# Patient Record
Sex: Female | Born: 1935
Health system: Southern US, Community
[De-identification: ages and names within clinical notes are randomized; demographics above are authoritative.]

## PROBLEM LIST (undated history)

## (undated) DIAGNOSIS — K602 Anal fissure, unspecified: Secondary | ICD-10-CM

## (undated) DIAGNOSIS — T7840XA Allergy, unspecified, initial encounter: Secondary | ICD-10-CM

## (undated) DIAGNOSIS — K635 Polyp of colon: Secondary | ICD-10-CM

## (undated) DIAGNOSIS — R079 Chest pain, unspecified: Secondary | ICD-10-CM

## (undated) DIAGNOSIS — K5790 Diverticulosis of intestine, part unspecified, without perforation or abscess without bleeding: Secondary | ICD-10-CM

## (undated) DIAGNOSIS — A419 Sepsis, unspecified organism: Secondary | ICD-10-CM

## (undated) DIAGNOSIS — R0600 Dyspnea, unspecified: Secondary | ICD-10-CM

## (undated) DIAGNOSIS — K649 Unspecified hemorrhoids: Secondary | ICD-10-CM

## (undated) DIAGNOSIS — K219 Gastro-esophageal reflux disease without esophagitis: Secondary | ICD-10-CM

## (undated) DIAGNOSIS — I4819 Other persistent atrial fibrillation: Secondary | ICD-10-CM

## (undated) DIAGNOSIS — T17908A Unspecified foreign body in respiratory tract, part unspecified causing other injury, initial encounter: Secondary | ICD-10-CM

## (undated) DIAGNOSIS — I5189 Other ill-defined heart diseases: Secondary | ICD-10-CM

## (undated) DIAGNOSIS — I2510 Atherosclerotic heart disease of native coronary artery without angina pectoris: Secondary | ICD-10-CM

## (undated) DIAGNOSIS — J849 Interstitial pulmonary disease, unspecified: Secondary | ICD-10-CM

## (undated) DIAGNOSIS — J8410 Pulmonary fibrosis, unspecified: Secondary | ICD-10-CM

## (undated) DIAGNOSIS — M8448XA Pathological fracture, other site, initial encounter for fracture: Secondary | ICD-10-CM

## (undated) HISTORY — DX: Dyspnea, unspecified: R06.00

## (undated) HISTORY — DX: Gastro-esophageal reflux disease without esophagitis: K21.9

## (undated) HISTORY — DX: Allergy, unspecified, initial encounter: T78.40XA

## (undated) HISTORY — DX: Unspecified foreign body in respiratory tract, part unspecified causing other injury, initial encounter: T17.908A

## (undated) HISTORY — DX: Pathological fracture, other site, initial encounter for fracture: M84.48XA

## (undated) HISTORY — DX: Diverticulosis of intestine, part unspecified, without perforation or abscess without bleeding: K57.90

## (undated) HISTORY — DX: Anal fissure, unspecified: K60.2

## (undated) HISTORY — DX: Chest pain, unspecified: R07.9

## (undated) HISTORY — DX: Unspecified hemorrhoids: K64.9

## (undated) HISTORY — DX: Atherosclerotic heart disease of native coronary artery without angina pectoris: I25.10

## (undated) HISTORY — DX: Other ill-defined heart diseases: I51.89

## (undated) HISTORY — DX: Sepsis, unspecified organism: A41.9

## (undated) HISTORY — DX: Other persistent atrial fibrillation: I48.19

## (undated) HISTORY — PX: ABDOMINAL HYSTERECTOMY: SHX81

## (undated) HISTORY — DX: Polyp of colon: K63.5

---

## 1997-10-04 DIAGNOSIS — A419 Sepsis, unspecified organism: Secondary | ICD-10-CM

## 1997-10-04 HISTORY — DX: Sepsis, unspecified organism: A41.9

## 2011-10-21 DIAGNOSIS — Z23 Encounter for immunization: Secondary | ICD-10-CM | POA: Diagnosis not present

## 2011-10-21 DIAGNOSIS — H2589 Other age-related cataract: Secondary | ICD-10-CM | POA: Diagnosis not present

## 2011-10-21 DIAGNOSIS — H43819 Vitreous degeneration, unspecified eye: Secondary | ICD-10-CM | POA: Diagnosis not present

## 2011-10-21 DIAGNOSIS — Z961 Presence of intraocular lens: Secondary | ICD-10-CM | POA: Diagnosis not present

## 2011-10-21 DIAGNOSIS — M810 Age-related osteoporosis without current pathological fracture: Secondary | ICD-10-CM | POA: Diagnosis not present

## 2011-10-21 DIAGNOSIS — I8390 Asymptomatic varicose veins of unspecified lower extremity: Secondary | ICD-10-CM | POA: Diagnosis not present

## 2011-11-09 DIAGNOSIS — I83893 Varicose veins of bilateral lower extremities with other complications: Secondary | ICD-10-CM | POA: Diagnosis not present

## 2011-11-09 DIAGNOSIS — M79609 Pain in unspecified limb: Secondary | ICD-10-CM | POA: Diagnosis not present

## 2011-11-09 DIAGNOSIS — I872 Venous insufficiency (chronic) (peripheral): Secondary | ICD-10-CM | POA: Diagnosis not present

## 2011-12-01 DIAGNOSIS — Z0000 Encounter for general adult medical examination without abnormal findings: Secondary | ICD-10-CM | POA: Diagnosis not present

## 2011-12-03 DIAGNOSIS — M810 Age-related osteoporosis without current pathological fracture: Secondary | ICD-10-CM | POA: Diagnosis not present

## 2012-02-08 DIAGNOSIS — I83893 Varicose veins of bilateral lower extremities with other complications: Secondary | ICD-10-CM | POA: Diagnosis not present

## 2012-02-08 DIAGNOSIS — M79609 Pain in unspecified limb: Secondary | ICD-10-CM | POA: Diagnosis not present

## 2012-02-08 DIAGNOSIS — I872 Venous insufficiency (chronic) (peripheral): Secondary | ICD-10-CM | POA: Diagnosis not present

## 2012-02-09 DIAGNOSIS — I872 Venous insufficiency (chronic) (peripheral): Secondary | ICD-10-CM | POA: Diagnosis not present

## 2012-02-09 DIAGNOSIS — M79609 Pain in unspecified limb: Secondary | ICD-10-CM | POA: Diagnosis not present

## 2012-02-09 DIAGNOSIS — I83893 Varicose veins of bilateral lower extremities with other complications: Secondary | ICD-10-CM | POA: Diagnosis not present

## 2012-02-10 DIAGNOSIS — M79609 Pain in unspecified limb: Secondary | ICD-10-CM | POA: Diagnosis not present

## 2012-02-10 DIAGNOSIS — I83893 Varicose veins of bilateral lower extremities with other complications: Secondary | ICD-10-CM | POA: Diagnosis not present

## 2012-02-10 DIAGNOSIS — I872 Venous insufficiency (chronic) (peripheral): Secondary | ICD-10-CM | POA: Diagnosis not present

## 2012-02-15 DIAGNOSIS — I83893 Varicose veins of bilateral lower extremities with other complications: Secondary | ICD-10-CM | POA: Diagnosis not present

## 2012-02-15 DIAGNOSIS — M79609 Pain in unspecified limb: Secondary | ICD-10-CM | POA: Diagnosis not present

## 2012-02-15 DIAGNOSIS — I872 Venous insufficiency (chronic) (peripheral): Secondary | ICD-10-CM | POA: Diagnosis not present

## 2012-02-17 DIAGNOSIS — M79609 Pain in unspecified limb: Secondary | ICD-10-CM | POA: Diagnosis not present

## 2012-02-17 DIAGNOSIS — I872 Venous insufficiency (chronic) (peripheral): Secondary | ICD-10-CM | POA: Diagnosis not present

## 2012-02-17 DIAGNOSIS — I83893 Varicose veins of bilateral lower extremities with other complications: Secondary | ICD-10-CM | POA: Diagnosis not present

## 2012-02-18 DIAGNOSIS — I83893 Varicose veins of bilateral lower extremities with other complications: Secondary | ICD-10-CM | POA: Diagnosis not present

## 2012-02-18 DIAGNOSIS — M79609 Pain in unspecified limb: Secondary | ICD-10-CM | POA: Diagnosis not present

## 2012-02-18 DIAGNOSIS — I872 Venous insufficiency (chronic) (peripheral): Secondary | ICD-10-CM | POA: Diagnosis not present

## 2012-02-24 DIAGNOSIS — H2589 Other age-related cataract: Secondary | ICD-10-CM | POA: Diagnosis not present

## 2012-02-24 DIAGNOSIS — Z961 Presence of intraocular lens: Secondary | ICD-10-CM | POA: Diagnosis not present

## 2012-02-24 DIAGNOSIS — H353190 Nonexudative age-related macular degeneration, unspecified eye, stage unspecified: Secondary | ICD-10-CM | POA: Diagnosis not present

## 2012-03-09 DIAGNOSIS — M7989 Other specified soft tissue disorders: Secondary | ICD-10-CM | POA: Diagnosis not present

## 2012-03-09 DIAGNOSIS — I83893 Varicose veins of bilateral lower extremities with other complications: Secondary | ICD-10-CM | POA: Diagnosis not present

## 2012-03-09 DIAGNOSIS — I872 Venous insufficiency (chronic) (peripheral): Secondary | ICD-10-CM | POA: Diagnosis not present

## 2012-03-10 DIAGNOSIS — I872 Venous insufficiency (chronic) (peripheral): Secondary | ICD-10-CM | POA: Diagnosis not present

## 2012-03-10 DIAGNOSIS — I83893 Varicose veins of bilateral lower extremities with other complications: Secondary | ICD-10-CM | POA: Diagnosis not present

## 2012-03-10 DIAGNOSIS — M79609 Pain in unspecified limb: Secondary | ICD-10-CM | POA: Diagnosis not present

## 2012-03-13 DIAGNOSIS — H2589 Other age-related cataract: Secondary | ICD-10-CM | POA: Diagnosis not present

## 2012-03-15 DIAGNOSIS — Z1231 Encounter for screening mammogram for malignant neoplasm of breast: Secondary | ICD-10-CM | POA: Diagnosis not present

## 2012-03-16 DIAGNOSIS — I872 Venous insufficiency (chronic) (peripheral): Secondary | ICD-10-CM | POA: Diagnosis not present

## 2012-03-16 DIAGNOSIS — M79609 Pain in unspecified limb: Secondary | ICD-10-CM | POA: Diagnosis not present

## 2012-03-16 DIAGNOSIS — I83893 Varicose veins of bilateral lower extremities with other complications: Secondary | ICD-10-CM | POA: Diagnosis not present

## 2012-03-29 DIAGNOSIS — M79609 Pain in unspecified limb: Secondary | ICD-10-CM | POA: Diagnosis not present

## 2012-03-29 DIAGNOSIS — I83893 Varicose veins of bilateral lower extremities with other complications: Secondary | ICD-10-CM | POA: Diagnosis not present

## 2012-03-29 DIAGNOSIS — I872 Venous insufficiency (chronic) (peripheral): Secondary | ICD-10-CM | POA: Diagnosis not present

## 2012-04-05 DIAGNOSIS — H04129 Dry eye syndrome of unspecified lacrimal gland: Secondary | ICD-10-CM | POA: Diagnosis not present

## 2012-04-05 DIAGNOSIS — Z961 Presence of intraocular lens: Secondary | ICD-10-CM | POA: Diagnosis not present

## 2012-05-08 DIAGNOSIS — M79609 Pain in unspecified limb: Secondary | ICD-10-CM | POA: Diagnosis not present

## 2012-05-08 DIAGNOSIS — I8310 Varicose veins of unspecified lower extremity with inflammation: Secondary | ICD-10-CM | POA: Diagnosis not present

## 2012-05-08 DIAGNOSIS — M7989 Other specified soft tissue disorders: Secondary | ICD-10-CM | POA: Diagnosis not present

## 2012-05-08 DIAGNOSIS — I872 Venous insufficiency (chronic) (peripheral): Secondary | ICD-10-CM | POA: Diagnosis not present

## 2012-05-09 DIAGNOSIS — I83893 Varicose veins of bilateral lower extremities with other complications: Secondary | ICD-10-CM | POA: Diagnosis not present

## 2012-05-09 DIAGNOSIS — I872 Venous insufficiency (chronic) (peripheral): Secondary | ICD-10-CM | POA: Diagnosis not present

## 2012-05-09 DIAGNOSIS — I8310 Varicose veins of unspecified lower extremity with inflammation: Secondary | ICD-10-CM | POA: Diagnosis not present

## 2012-05-25 DIAGNOSIS — I872 Venous insufficiency (chronic) (peripheral): Secondary | ICD-10-CM | POA: Diagnosis not present

## 2012-05-25 DIAGNOSIS — I83893 Varicose veins of bilateral lower extremities with other complications: Secondary | ICD-10-CM | POA: Diagnosis not present

## 2012-05-25 DIAGNOSIS — I8310 Varicose veins of unspecified lower extremity with inflammation: Secondary | ICD-10-CM | POA: Diagnosis not present

## 2012-05-26 DIAGNOSIS — I872 Venous insufficiency (chronic) (peripheral): Secondary | ICD-10-CM | POA: Diagnosis not present

## 2012-05-26 DIAGNOSIS — M79609 Pain in unspecified limb: Secondary | ICD-10-CM | POA: Diagnosis not present

## 2012-05-26 DIAGNOSIS — I83893 Varicose veins of bilateral lower extremities with other complications: Secondary | ICD-10-CM | POA: Diagnosis not present

## 2012-06-09 DIAGNOSIS — I8310 Varicose veins of unspecified lower extremity with inflammation: Secondary | ICD-10-CM | POA: Diagnosis not present

## 2012-06-09 DIAGNOSIS — I872 Venous insufficiency (chronic) (peripheral): Secondary | ICD-10-CM | POA: Diagnosis not present

## 2012-06-09 DIAGNOSIS — IMO0002 Reserved for concepts with insufficient information to code with codable children: Secondary | ICD-10-CM | POA: Diagnosis not present

## 2012-07-06 DIAGNOSIS — I872 Venous insufficiency (chronic) (peripheral): Secondary | ICD-10-CM | POA: Diagnosis not present

## 2012-07-06 DIAGNOSIS — I8310 Varicose veins of unspecified lower extremity with inflammation: Secondary | ICD-10-CM | POA: Diagnosis not present

## 2012-07-06 DIAGNOSIS — I83893 Varicose veins of bilateral lower extremities with other complications: Secondary | ICD-10-CM | POA: Diagnosis not present

## 2012-07-10 DIAGNOSIS — Z23 Encounter for immunization: Secondary | ICD-10-CM | POA: Diagnosis not present

## 2012-07-11 DIAGNOSIS — I872 Venous insufficiency (chronic) (peripheral): Secondary | ICD-10-CM | POA: Diagnosis not present

## 2012-07-11 DIAGNOSIS — I83893 Varicose veins of bilateral lower extremities with other complications: Secondary | ICD-10-CM | POA: Diagnosis not present

## 2012-07-11 DIAGNOSIS — I8310 Varicose veins of unspecified lower extremity with inflammation: Secondary | ICD-10-CM | POA: Diagnosis not present

## 2012-07-11 DIAGNOSIS — IMO0002 Reserved for concepts with insufficient information to code with codable children: Secondary | ICD-10-CM | POA: Diagnosis not present

## 2012-09-07 ENCOUNTER — Ambulatory Visit (INDEPENDENT_AMBULATORY_CARE_PROVIDER_SITE_OTHER): Payer: Medicare Other | Admitting: Family Medicine

## 2012-09-07 ENCOUNTER — Ambulatory Visit: Payer: Self-pay | Admitting: Family Medicine

## 2012-09-07 ENCOUNTER — Encounter: Payer: Self-pay | Admitting: Family Medicine

## 2012-09-07 VITALS — BP 104/60 | HR 64 | Temp 97.7°F | Ht 67.5 in | Wt 123.0 lb

## 2012-09-07 DIAGNOSIS — T7840XA Allergy, unspecified, initial encounter: Secondary | ICD-10-CM | POA: Diagnosis not present

## 2012-09-07 DIAGNOSIS — G4700 Insomnia, unspecified: Secondary | ICD-10-CM | POA: Diagnosis not present

## 2012-09-07 DIAGNOSIS — K219 Gastro-esophageal reflux disease without esophagitis: Secondary | ICD-10-CM | POA: Diagnosis not present

## 2012-09-07 NOTE — Patient Instructions (Addendum)
Great to see you. Please add Pepcid 20 mg night for next two weeks.  You can always add Melatonin OTC.  Have a wonderful Christmas!

## 2012-09-07 NOTE — Progress Notes (Signed)
Subjective:    Patient ID: Crystal Gregory, female    DOB: [birth year], 78 y.o.   MRN: [id number]  HPI  Very pleasant 78 yo female here to establish care.  Retired Charity fundraiser, very pleasant. Moved here to [facility] with her husband.  GERD- found to have a hiatal hernia several years ago.  Symptoms had been controlled on Aciphex 20 mg twice daily for years. Under increased stressors past 6 months- husband's health, children going through stressors (son has prostate CA and daughter going through a divorce). Feeling of acid in mouth is now waking her up at night.  Has a sore throat in morning, sometimes dry cough. Diet has not changed and she is careful with acidic foods.  She does drink OJ but states this has never bothered her stomach.  No CP or SOB. She does have some seasonal allergies.  Insomnia-  Takes Trazadone 50 mg qhs for sleep.  Given recent stressors, has had a few more sleepless nights.  Denies feeling overly anxious or depressed.  Patient Active Problem List  Diagnosis  . GERD (gastroesophageal reflux disease)  . Allergy   Past Medical History  Diagnosis Date  . GERD (gastroesophageal reflux disease)   . Allergy   . Septicemia 1999    spent 4 weeks in ICU, intubated -? PNA   Past Surgical History  Procedure Date  . Abdominal hysterectomy    History  Substance Use Topics  . Smoking status: Former Games developer  . Smokeless tobacco: Not on file  . Alcohol Use: Not on file   Family History  Problem Relation Age of Onset  . Parkinson's disease Mother    No Known Allergies Current Outpatient Prescriptions on File Prior to Visit  Medication Sig Dispense Refill  . propranolol (INDERAL) 10 MG tablet Take 10 mg by mouth 2 (two) times daily.      . RABEprazole (ACIPHEX) 20 MG tablet Take 20 mg by mouth 2 (two) times daily.      . traZODone (DESYREL) 50 MG tablet Take 50 mg by mouth at bedtime.       The PMH, PSH, Social History, Family History, Medications, and allergies have  been reviewed in [facility], and have been updated if relevant.   Review of Systems See HPI Patient reports no  vision/ hearing changes,anorexia, weight change, fever ,adenopathy, persistant / recurrent hoarseness, swallowing issues, chest pain, edema,persistant / recurrent cough, hemoptysis, dyspnea(rest, exertional, paroxysmal nocturnal), gastrointestinal  bleeding (melena, rectal bleeding), abdominal pain,  GU symptoms(dysuria, hematuria, pyuria, voiding/incontinence  Issues) syncope, focal weakness, severe memory loss, concerning skin lesions, depression, anxiety, abnormal bruising/bleeding, major joint swelling, breast masses or abnormal vaginal bleeding.       Objective:   Physical Exam BP 104/60  Pulse 64  Temp 97.7 F (36.5 C)  Ht 5' 7.5" (1.715 m)  Wt 123 lb (55.792 kg)  BMI 18.98 kg/m2  General:  Well-developed,well-nourished,in no acute distress; alert,appropriate and cooperative throughout examination Head:  normocephalic and atraumatic.   Eyes:  vision grossly intact, pupils equal, pupils round, and pupils reactive to light.   Ears:  R ear normal and L ear normal.   Nose:  no external deformity.   Lungs:  Normal respiratory effort, chest expands symmetrically. Lungs are clear to auscultation, no crackles or wheezes. Heart:  Normal rate and regular rhythm. S1 and S2 normal without gallop, murmur, click, rub or other extra sounds. Abdomen:  Bowel sounds positive,abdomen soft and non-tender without masses, organomegaly or hernias noted. Msk:  No  deformity or scoliosis noted of thoracic or lumbar spine.   Extremities:  No clubbing, cyanosis, edema, or deformity noted with normal full range of motion of all joints.   Neurologic:  alert & oriented X3 and gait normal.   Skin:  Intact without suspicious lesions or rashes Cervical Nodes:  No lymphadenopathy noted Psych:  Cognition and judgment appear intact. Alert and cooperative with normal attention span and concentration. No apparent  delusions, illusions, hallucinations        Assessment & Plan:   1. GERD (gastroesophageal reflux disease)   Deteriorated. Add Pepcid 20 mg daily x 2 weeks to aciphex.  She will call me in 2 weeks with an update.  2.  Insomnia- Deteriorated with recent increased home stressors.  Advised adding OTC melatonin.  If that does not work, we can increase Trazadone done. The patient indicates understanding of these issues and agrees with the plan.

## 2012-09-21 ENCOUNTER — Telehealth: Payer: Self-pay | Admitting: Family Medicine

## 2012-09-21 NOTE — Telephone Encounter (Signed)
Patient Information:  Caller Name: Basil Dess  Phone: [phone]  Patient: Crystal Gregory  Gender: Female  DOB: [birth year]  Age: 78 Years  PCP: Ruthe Mannan [facility])  Office Follow Up:  Does the office need to follow up with this patient?: No  Instructions For The Office: N/A  RN Note:  Advised Home treatment and follow up if not better.  Symptoms  Reason For Call & Symptoms: Patient states that she has reflux and currently is on Aciphex . Due to increassing reflux Pepcid was added onset 09/07/12.  She states she began having Diarrhea on Monday 09/18/12, so  she stopped the Pepcid on Monday and went back to her regular dose of Aciphex but diarrhea has continued.  Stools >5-6 daily. Liquid [employer]. She is not nauseated and no vomiting.  Reviewed Health History In EMR: Yes  Reviewed Medications In EMR: Yes  Reviewed Allergies In EMR: Yes  Reviewed Surgeries / Procedures: No  Date of Onset of Symptoms: 09/18/2012  Guideline(s) Used:  Diarrhea  Disposition Per Guideline:   Callback by PCP Today  Reason For Disposition Reached:   Age > 70 years  Advice Given:  Reassurance:  In healthy adults, new-onset diarrhea is usually caused by a viral infection of the intestines, which you can treat at home. Diarrhea is the body's way of getting rid of the infection. Here are some tips on how to keep ahead of the fluid losses.  Here is some care advice that should help.  Diarrhea Medication  - Imodium AD:   Helps reduce diarrhea.  Adult dosage: 4 mg (2 capsules or 4 teaspoons or 20 ml) is the recommended first dose. You may take an additional 2 mg (1 capsule or 2 teaspoons or 10 ml) after each loose BM.  Maximum dosage: 16 mg (8 capsules or 16 teaspoons or 80 ml).  1 capsule = 2 mg; 1 teaspoon (5 ml) = 1 mg.  Caution: Do not use if you have a fever greater than 100F (37.8C). Do not use if there is blood or mucus in your stools. Do not use for more than 2 days.  Read all package  instructions.  Expected Course:  Viral diarrhea lasts 4-7 days. Always worse on days 1 and 2.  Call Back If:  Signs of dehydration occur (e.g., no urine for more than 12 hours, very dry mouth, lightheaded, etc.)  Diarrhea lasts over 7 days  You become worse.

## 2012-10-02 ENCOUNTER — Encounter: Payer: Self-pay | Admitting: Family Medicine

## 2012-10-02 ENCOUNTER — Ambulatory Visit (INDEPENDENT_AMBULATORY_CARE_PROVIDER_SITE_OTHER): Payer: Medicare Other | Admitting: Family Medicine

## 2012-10-02 ENCOUNTER — Encounter: Payer: Self-pay | Admitting: Gastroenterology

## 2012-10-02 VITALS — BP 120/60 | HR 72 | Temp 97.4°F | Wt 122.0 lb

## 2012-10-02 DIAGNOSIS — R197 Diarrhea, unspecified: Secondary | ICD-10-CM | POA: Diagnosis not present

## 2012-10-02 MED ORDER — ALIGN 4 MG PO CAPS: 1.0000 | ORAL_CAPSULE | Freq: Every day | ORAL | Status: DC

## 2012-10-02 NOTE — Patient Instructions (Addendum)
Good to see you. Let's try align for bloating/IBS symptoms, which is over the counter. Will get stool cultures as well.  Call me next week with an update.

## 2012-10-02 NOTE — Progress Notes (Signed)
Subjective:    Patient ID: Crystal Gregory, female    DOB: 22-Apr-1932, 78 y.o.   MRN: [id number]  HPI  Very pleasant 78 yo female here for diarrhea x 2 weeks.   No recent abx use.  No known sick contacts.  Diarrhea not associated with nausea or vomiting.  No fevers or chills.  No blood in stool.  Did have darker colored stools while taking immodium but that stopped. Cannot remember when she had her last colonoscopy- still awaiting all of her records.    Under increased stressors past 6 months- husband's health, children going through stressors (son has prostate CA and daughter going through a divorce).  Appetite has been quite good throughout this.  Sometimes stools are formed but typically watery.  Wt Readings from Last 3 Encounters:  10/02/12 122 lb (55.339 kg)  09/07/12 123 lb (55.792 kg)      Patient Active Problem List  Diagnosis  . GERD (gastroesophageal reflux disease)  . Allergy  . Diarrhea   Past Medical History  Diagnosis Date  . GERD (gastroesophageal reflux disease)   . Allergy   . Septicemia 1999    spent 4 weeks in ICU, intubated -? PNA   Past Surgical History  Procedure Date  . Abdominal hysterectomy    History  Substance Use Topics  . Smoking status: Former Games developer  . Smokeless tobacco: Not on file  . Alcohol Use: Not on file   Family History  Problem Relation Age of Onset  . Parkinson's disease Mother    No Known Allergies Current Outpatient Prescriptions on File Prior to Visit  Medication Sig Dispense Refill  . famotidine (PEPCID) 10 MG tablet Take 10 mg by mouth at bedtime.      . nitrofurantoin (MACRODANTIN) 50 MG capsule Take one by mouth daily      . propranolol (INDERAL) 10 MG tablet Take 10 mg by mouth 2 (two) times daily.      . RABEprazole (ACIPHEX) 20 MG tablet Take 20 mg by mouth 2 (two) times daily.      . traZODone (DESYREL) 50 MG tablet Take 50 mg by mouth at bedtime.       The PMH, PSH, Social History, Family History,  Medications, and allergies have been reviewed in [facility], and have been updated if relevant.   Review of Systems See HPI Patient reports no  vision/ hearing changes,anorexia, weight change, fever ,adenopathy, persistant / recurrent hoarseness, swallowing issues, chest pain, edema,persistant / recurrent cough, hemoptysis, dyspnea(rest, exertional, paroxysmal nocturnal), gastrointestinal  bleeding (melena, rectal bleeding), abdominal pain,  GU symptoms(dysuria, hematuria, pyuria, voiding/incontinence  Issues) syncope, focal weakness, severe memory loss, concerning skin lesions, depression, anxiety, abnormal bruising/bleeding, major joint swelling, breast masses or abnormal vaginal bleeding.       Objective:   Physical Exam BP 120/60  Pulse 72  Temp 97.4 F (36.3 C)  Wt 122 lb (55.339 kg)  General:  Well-developed,well-nourished,in no acute distress; alert,appropriate and cooperative throughout examination Head:  normocephalic and atraumatic.   Eyes:  vision grossly intact, pupils equal, pupils round, and pupils reactive to light.   Ears:  R ear normal and L ear normal.   Nose:  no external deformity.   Lungs:  Normal respiratory effort, chest expands symmetrically. Lungs are clear to auscultation, no crackles or wheezes. Heart:  Normal rate and regular rhythm. S1 and S2 normal without gallop, murmur, click, rub or other extra sounds. Abdomen:  Bowel sounds positive,abdomen soft and non-tender without masses, organomegaly or hernias  noted. Msk:  No deformity or scoliosis noted of thoracic or lumbar spine.   Extremities:  No clubbing, cyanosis, edema, or deformity noted with normal full range of motion of all joints.   Neurologic:  alert & oriented X3 and gait normal.   Skin:  Intact without suspicious lesions or rashes Psych:  Cognition and judgment appear intact. Alert and cooperative with normal attention span and concentration. No apparent delusions, illusions, hallucinations       Assessment & Plan:   1. Diarrhea  Stool culture, Clostridium Difficile by PCR, Ambulatory referral to Gastroenterology   Persistent x 2 weeks.  No red flag symptoms currently but duration of symptoms is concerning.  Will order stool cx, start trial of Align. Refer to GI for further work up. The patient indicates understanding of these issues and agrees with the plan.

## 2012-10-06 DIAGNOSIS — R197 Diarrhea, unspecified: Secondary | ICD-10-CM | POA: Diagnosis not present

## 2012-10-11 ENCOUNTER — Telehealth: Payer: Self-pay | Admitting: Family Medicine

## 2012-10-11 NOTE — Telephone Encounter (Signed)
Pt was calling to get the results of her stool culture.  RN was able to give patient results per EPIC.  Pt also wanted to let Dr Dayton Martes know that since he evaluated her (10/02/12), she is still having diarrhea,  Pt states it wakes her up with cramping at 0500.  Pt states from 0500 - 1000, she has at least 4 episodes of diarrhea. Pt states she is okay the rest of the day until late afternoon, she has cramping again and then she will have 1 additional episode of diarrhea.  Pt states her stools are now brown.  Pt denies any fever.  Pt is concerned because she has had an 8 pound weight loss.  Pt is not scheduled to see the GI specialist until 10/23/12.  Pt wanted to find out from Dr Dayton Martes if there was anything she can do/medications to take to stop the diarrhea. Pt has already tried Immodium and Keopectate.  OFFICE PLEASE FOLLOW UP WITH PATIENT

## 2012-10-11 NOTE — Telephone Encounter (Signed)
Appt moved up with Dr Erick Blinks on 10/13/12 at 3:15pm and patient is aware of appt.

## 2012-10-11 NOTE — Telephone Encounter (Signed)
Crystal Gregory, would it be possible to get a sooner appointment for her with GI?

## 2012-10-11 NOTE — Telephone Encounter (Signed)
Called [facility]  And they had a cancellation for this Friday 10/13/12 at 3:15 with Dr Erick Blinks so patient will go to [place] this Friday and she is aware of the appt as I called her.

## 2012-10-12 ENCOUNTER — Encounter: Payer: Self-pay | Admitting: Internal Medicine

## 2012-10-13 ENCOUNTER — Other Ambulatory Visit: Payer: Self-pay | Admitting: Gastroenterology

## 2012-10-13 ENCOUNTER — Ambulatory Visit (INDEPENDENT_AMBULATORY_CARE_PROVIDER_SITE_OTHER): Payer: Medicare Other | Admitting: Internal Medicine

## 2012-10-13 ENCOUNTER — Encounter: Payer: Self-pay | Admitting: Internal Medicine

## 2012-10-13 VITALS — BP 124/60 | HR 80 | Ht 68.0 in | Wt 121.0 lb

## 2012-10-13 DIAGNOSIS — R1031 Right lower quadrant pain: Secondary | ICD-10-CM

## 2012-10-13 DIAGNOSIS — K449 Diaphragmatic hernia without obstruction or gangrene: Secondary | ICD-10-CM | POA: Insufficient documentation

## 2012-10-13 DIAGNOSIS — R109 Unspecified abdominal pain: Secondary | ICD-10-CM

## 2012-10-13 DIAGNOSIS — R197 Diarrhea, unspecified: Secondary | ICD-10-CM

## 2012-10-13 MED ORDER — DIPHENOXYLATE-ATROPINE 2.5-0.025 MG PO TABS: 1.0000 | ORAL_TABLET | Freq: Four times a day (QID) | ORAL | Status: DC | PRN

## 2012-10-13 MED ORDER — ALIGN PO CAPS: 1.0000 | ORAL_CAPSULE | Freq: Every day | ORAL | Status: DC

## 2012-10-13 MED ORDER — HYOSCYAMINE SULFATE 0.125 MG SL SUBL: 0.1250 mg | SUBLINGUAL_TABLET | SUBLINGUAL | Status: DC | PRN

## 2012-10-13 NOTE — Patient Instructions (Addendum)
We have sent the following medications to your pharmacy for you to pick up at your convenience:continue taking align,  Lomotil, levsin  Follow up with Dr. Rhea Belton in 4 weeks

## 2012-10-13 NOTE — Progress Notes (Signed)
Patient ID: Crystal Gregory, female   DOB: 1932/09/03, 79 y.o.   MRN: [id number]  SUBJECTIVE: HPI Crystal Gregory is a 79 year old female with a past medical history of GERD, seasonal allergies who seen in consultation at the request of Dr. Dayton Martes for evaluation of diarrhea. She is accompanied today by her husband. The patient reports over the last 3-4 weeks she's developed diarrhea and loose stools. She reports prior to this never having had issues with diarrhea and was having one formed stool per day. She recently moved from [place] to [place] to [facility]. They have been building a house there. She reports the diarrhea started acutely on Monday morning and since then she is having 4-5 loose and watery stools per day.  She reports that this has been waking her from sleep about 5 AM when she has loose stools and then she'll have to 3 additional bowel movements up to 10 AM. She subsequently has an evening stool or 2. She's noted some lower abdominal cramping pain associated with the diarrhea as well as increased fecal urgency. She denies melena or rectal bleeding. She tried over-the-counter loperamide Kaopectate per box instructions without benefit. She also reports Dr. Dayton Martes gave her a trial of align which she does not think has changed her stools to this point.  She denies upper symptoms, though she has a long-standing history of heartburn. She's been on AcipHex 20 mg for years, but doubled dose this summer when her reflux worsen. Dr. Clifton Custard had started Pepcid recently for breakthrough heartburn at night. Otherwise no new medications. She denies fevers. Her appetite has been very good, and she has been able to eat. She has noted an approximate 6 pound weight loss over the last 1-2 months. She also notes a dramatic increase in stress recently with her move, and also when they got here their house was not fully ready and they have been living in a villa temporarily.  She also notes that her  70 year old son has recently been dealing with a new diagnosis of prostate cancer in her 36 year old daughter recently informed them that she is getting her second divorce. All of these issues have been quite stressful on her.  She reports a colonoscopy 4 years ago, approximately, and recalls this having been normal without polyps.  Review of Systems  As per history of present illness, otherwise negative   Past Medical History  Diagnosis Date  . GERD (gastroesophageal reflux disease)   . Allergy   . Septicemia 1999    spent 4 weeks in ICU, intubated -? PNA    Current Outpatient Prescriptions  Medication Sig Dispense Refill  . famotidine (PEPCID) 10 MG tablet Take 10 mg by mouth at bedtime.      . nitrofurantoin (MACRODANTIN) 50 MG capsule Take one by mouth daily      . propranolol (INDERAL) 10 MG tablet Take 10 mg by mouth 2 (two) times daily.      . RABEprazole (ACIPHEX) 20 MG tablet Take 20 mg by mouth 2 (two) times daily.      . traZODone (DESYREL) 50 MG tablet Take 50 mg by mouth at bedtime.      . bifidobacterium infantis (ALIGN) capsule Take 1 capsule by mouth daily.  14 capsule  0  . diphenoxylate-atropine (LOMOTIL) 2.5-0.025 MG per tablet Take 1 tablet by mouth 4 (four) times daily as needed for diarrhea or loose stools.  120 tablet  0  . hyoscyamine (LEVSIN SL) 0.125 MG SL tablet Place  1 tablet (0.125 mg total) under the tongue every 4 (four) hours as needed for cramping.  30 tablet  0    No Known Allergies  Family History  Problem Relation Age of Onset  . Parkinson's disease Mother   . Colon polyps Mother 49       . Breast cancer Sister     History  Substance Use Topics  . Smoking status: Former Games developer  . Smokeless tobacco: Never Used  . Alcohol Use: Yes     Comment: 2 drinks daily     OBJECTIVE: BP 124/60  Pulse 80  Ht 5\' 8"  (1.727 m)  Wt 121 lb (54.885 kg)  BMI 18.40 kg/m2 Constitutional: Well-developed and well-nourished. No distress. HEENT:  Normocephalic and atraumatic. Oropharynx is clear and moist. No oropharyngeal exudate. Conjunctivae are normal. No scleral icterus. Neck: Neck supple. Trachea midline. Cardiovascular: Normal rate, regular rhythm and intact distal pulses. No M/R/G Pulmonary/chest: Effort normal and breath sounds normal. No wheezing, rales or rhonchi. Abdominal: Soft, nontender, nondistended. Bowel sounds active throughout. There are no masses palpable. No hepatosplenomegaly. Well-healed lower abdominal scar Extremities: no clubbing, cyanosis, or edema Lymphadenopathy: No cervical adenopathy noted. Neurological: Alert and oriented to person place and time. Skin: Skin is warm and dry. No rashes noted. Psychiatric: Normal mood and affect. Behavior is normal.  Labs and Imaging -- Stool cx and c diff test 10/02/12 -- negative  ASSESSMENT AND PLAN: 79 year old female with a past medical history of GERD, seasonal allergies who seen in consultation at the request of Dr. Dayton Martes for evaluation of diarrhea.   1.  Diarrhea/loose stools, acute -- her loose stools/diarrhea are still acute it did not been going on long enough to be considered chronic. The differential includes infectious diarrhea, but C. difficile and the normal bacterial pathogens were excluded with culture a couple weeks ago. She could have a postinfectious irritable bowel, or even newly developed microscopic colitis. This does not sound like inflammatory bowel disease at this point. For now I have recommended that she continue with the align one daily. I will give her prescription for Levsin to be used as needed and as directed for lower abdominal cramping/spasm.  I will send a more detailed GI pathogen panel which will exclude bacterial, viral, and parasitic pathogens.  I would like to see her back in one month, at this point she will be through with her move and possibly with decreased stress levels her loose stools we'll have also improve.  If not we may  consider flexible sigmoidoscopy or colonoscopy for evaluation of the colon and biopsy.  She is satisfied with this plan of action.  I would like to check labs today, CBC, CMP, celiac panel.  I will see her back in 4 weeks

## 2012-10-16 ENCOUNTER — Telehealth: Payer: Self-pay | Admitting: Gastroenterology

## 2012-10-16 ENCOUNTER — Other Ambulatory Visit (INDEPENDENT_AMBULATORY_CARE_PROVIDER_SITE_OTHER): Payer: Medicare Other

## 2012-10-16 DIAGNOSIS — R197 Diarrhea, unspecified: Secondary | ICD-10-CM | POA: Diagnosis not present

## 2012-10-16 LAB — COMPREHENSIVE METABOLIC PANEL
BUN: 13 mg/dL (ref 6–23)
CO2: 26 mEq/L (ref 19–32)
Calcium: 8.8 mg/dL (ref 8.4–10.5)
Chloride: 104 mEq/L (ref 96–112)
Creatinine, Ser: 0.9 mg/dL (ref 0.4–1.2)
GFR: 61.08 mL/min (ref >60.00)
Glucose, Bld: 98 mg/dL (ref 70–99)
Total Bilirubin: 0.7 mg/dL (ref 0.3–1.2)

## 2012-10-16 LAB — IGA: IgA: 132 mg/dL (ref 68–378)

## 2012-10-16 NOTE — Telephone Encounter (Signed)
lvm for pt stating Dr. Rhea Belton would like her to have some labs drawn, before he see's her back in 4 weeks, I let her know she can either go to [facility] or [place], if she did not want to come back here. Asked her to call me with any questions.

## 2012-10-17 DIAGNOSIS — R197 Diarrhea, unspecified: Secondary | ICD-10-CM | POA: Diagnosis not present

## 2012-10-17 LAB — TISSUE TRANSGLUTAMINASE, IGA: Tissue Transglutaminase Ab, IgA: 2.8 U/mL (ref <20)

## 2012-10-17 NOTE — Addendum Note (Signed)
Addended by: Alvina Chou on: 10/17/2012 09:12 AM   Modules accepted: Orders

## 2012-10-18 LAB — GASTROINTESTINAL PATHOGEN PANEL PCR
C. difficile Tox A/B, PCR: NEGATIVE
Campylobacter, PCR: NEGATIVE
Giardia lamblia, PCR: NEGATIVE
Norovirus, PCR: NEGATIVE
Rotavirus A, PCR: NEGATIVE

## 2012-10-23 ENCOUNTER — Ambulatory Visit: Payer: TRICARE For Life (TFL) | Admitting: Gastroenterology

## 2012-11-09 ENCOUNTER — Encounter: Payer: Self-pay | Admitting: Internal Medicine

## 2012-11-09 ENCOUNTER — Ambulatory Visit: Payer: Self-pay | Admitting: Family Medicine

## 2012-11-14 ENCOUNTER — Ambulatory Visit (INDEPENDENT_AMBULATORY_CARE_PROVIDER_SITE_OTHER): Payer: Medicare Other | Admitting: Internal Medicine

## 2012-11-14 ENCOUNTER — Encounter: Payer: Self-pay | Admitting: Internal Medicine

## 2012-11-14 VITALS — BP 124/62 | HR 72 | Ht 68.0 in | Wt 122.0 lb

## 2012-11-14 DIAGNOSIS — R197 Diarrhea, unspecified: Secondary | ICD-10-CM | POA: Diagnosis not present

## 2012-11-14 DIAGNOSIS — K219 Gastro-esophageal reflux disease without esophagitis: Secondary | ICD-10-CM | POA: Diagnosis not present

## 2012-11-14 NOTE — Patient Instructions (Addendum)
Follow up as needed

## 2012-11-14 NOTE — Progress Notes (Signed)
SUBJECTIVE: HPI Crystal Gregory is a 79 year old female with a past medical history of GERD and seasonal allergies who seen in followup after initially being seen 4 weeks ago for diarrhea and lower abdominal cramping. At her last visit she used align on a daily basis as a probiotic, and was also using Levsin for lower, cramping. She's not sure the Levsin helped much. She was also using Lomotil after loose stools which did seem to provide benefit. She reports her last 3-4 days complete resolution of her diarrhea and lower abdominal cramping.  After her visit she continued to have intermittent loose stools, at times explosive, but slowly this has improved. She reports her appetite has remained good. Her reflux has been well controlled, in fact she reduced her AcipHex to one tablet daily and has tolerated this well. She is using Pepcid for breakthrough symptoms on occasion. No rectal bleeding or melena. Her stools both yesterday and today were formed and brown. No fevers or chills.  They did complete there relocated to the new home and this has gone very well.   Review of Systems  As per history of present illness, otherwise negative   Past Medical History  Diagnosis Date  . GERD (gastroesophageal reflux disease)   . Allergy   . Septicemia 1999    spent 4 weeks in ICU, intubated -? PNA    Current Outpatient Prescriptions  Medication Sig Dispense Refill  . diphenoxylate-atropine (LOMOTIL) 2.5-0.025 MG per tablet Take 1 tablet by mouth 4 (four) times daily as needed for diarrhea or loose stools.  120 tablet  0  . famotidine (PEPCID) 10 MG tablet Take 10 mg by mouth at bedtime.      . nitrofurantoin (MACRODANTIN) 50 MG capsule Take one by mouth daily      . propranolol (INDERAL) 10 MG tablet Take 10 mg by mouth 2 (two) times daily.      . RABEprazole (ACIPHEX) 20 MG tablet Take 20 mg by mouth 2 (two) times daily.      . traZODone (DESYREL) 50 MG tablet Take 50 mg by mouth at bedtime.       No  current facility-administered medications for this visit.    No Known Allergies  Family History  Problem Relation Age of Onset  . Parkinson's disease Mother   . Colon polyps Mother 79       . Breast cancer Sister     History  Substance Use Topics  . Smoking status: Former Games developer  . Smokeless tobacco: Never Used  . Alcohol Use: Yes     Comment: 2 drinks daily     OBJECTIVE: BP 124/62  Pulse 72  Ht 5\' 8"  (1.727 m)  Wt 122 lb (55.339 kg)  BMI 18.55 kg/m2 Constitutional: Well-developed and well-nourished. No distress. HEENT: Normocephalic and atraumatic. Conjunctivae are normal. No scleral icterus. Neck: Neck supple. Trachea midline. Cardiovascular: Normal rate, regular rhythm and intact distal pulses. Pulmonary/chest: Effort normal and breath sounds normal. No wheezing, rales or rhonchi. Abdominal: Soft, nontender, nondistended. Bowel sounds active throughout. There are no masses palpable. No hepatosplenomegaly. Extremities: no clubbing, cyanosis, or edema Neurological: Alert and oriented to person place and time. Psychiatric: Normal mood and affect. Behavior is normal.  Labs and Imaging -- GI pathogen panel negative Celiac panel normal  CMP     Component Value Date/Time   NA 137 10/16/2012 1020   K 4.1 10/16/2012 1020   CL 104 10/16/2012 1020   CO2 26 10/16/2012 1020   GLUCOSE 98  10/16/2012 1020   BUN 13 10/16/2012 1020   CREATININE 0.9 10/16/2012 1020   CALCIUM 8.8 10/16/2012 1020   PROT 7.2 10/16/2012 1020   ALBUMIN 3.9 10/16/2012 1020   AST 32 10/16/2012 1020   ALT 19 10/16/2012 1020   ALKPHOS 33* 10/16/2012 1020   BILITOT 0.7 10/16/2012 1020    ASSESSMENT AND PLAN: 79 year old female with a past medical history of GERD and seasonal allergies who seen in followup after initially being seen 4 weeks ago for diarrhea and lower abdominal cramping  1.  Acute, frequent loose stools/lower abdominal cramping -- her symptoms have completely resolved which is very reassuring.  The etiology of her symptoms is unclear, but could have been infectious with post infectious irritable bowel symptoms. Some could have also been irritable bowel low and given her relocation and stress regarding her move and family issues.  She has no alarm symptoms at present, and I do not feel any further workup is necessary at this time.  Her stools have completely returned to normal and she has no further abdominal pain. She can continue the align if she desires. She will return to her primary care, he can be seen as necessary. She is asked to call me should she develop recurrent diarrhea, lower abdominal pain, or any other concerning symptoms such as rectal bleeding or melena. She voices understanding.  2.  GERD -- she will continue daily AcipHex for chronic management, with apparent good symptom control. No alarm symptoms.

## 2012-11-28 ENCOUNTER — Encounter: Payer: Self-pay | Admitting: Family Medicine

## 2012-11-29 MED ORDER — NITROFURANTOIN MACROCRYSTAL 50 MG PO CAPS: ORAL_CAPSULE | ORAL | Status: DC

## 2012-11-29 MED ORDER — PROPRANOLOL HCL 10 MG PO TABS: 10.0000 mg | ORAL_TABLET | Freq: Two times a day (BID) | ORAL | Status: DC

## 2012-11-29 NOTE — Telephone Encounter (Signed)
Scripts printed, placed on doctor's desk for signature.

## 2012-11-29 NOTE — Telephone Encounter (Signed)
Scripts mailed to patient, per her request.

## 2012-12-05 ENCOUNTER — Telehealth: Payer: Self-pay | Admitting: *Deleted

## 2012-12-05 NOTE — Telephone Encounter (Signed)
Form completed for referral for reclast infusion, but lab results are out of date, results must be within 30 days of infusion.  These results are from 10/16/12.  Do you want to order more labs?

## 2012-12-05 NOTE — Telephone Encounter (Signed)
i apologize.  Yes please order updated labs.

## 2012-12-06 ENCOUNTER — Other Ambulatory Visit (INDEPENDENT_AMBULATORY_CARE_PROVIDER_SITE_OTHER): Payer: Medicare Other

## 2012-12-06 DIAGNOSIS — Z5181 Encounter for therapeutic drug level monitoring: Secondary | ICD-10-CM | POA: Diagnosis not present

## 2012-12-06 DIAGNOSIS — Z7983 Long term (current) use of bisphosphonates: Secondary | ICD-10-CM

## 2012-12-06 LAB — COMPREHENSIVE METABOLIC PANEL
ALT: 19 U/L (ref 0–35)
CO2: 26 mEq/L (ref 19–32)
Creatinine, Ser: 0.9 mg/dL (ref 0.4–1.2)
GFR: 65.89 mL/min (ref >60.00)
Total Bilirubin: 0.9 mg/dL (ref 0.3–1.2)

## 2012-12-06 NOTE — Telephone Encounter (Signed)
Lab appt made for 12/06/12.

## 2012-12-07 ENCOUNTER — Encounter: Payer: Self-pay | Admitting: Family Medicine

## 2012-12-12 ENCOUNTER — Other Ambulatory Visit (HOSPITAL_COMMUNITY): Payer: Self-pay | Admitting: *Deleted

## 2012-12-13 ENCOUNTER — Ambulatory Visit (HOSPITAL_COMMUNITY)
Admission: RE | Admit: 2012-12-13 | Discharge: 2012-12-13 | Disposition: A | Discharge status: Home / Self Care | Payer: Medicare Other | Source: Ambulatory Visit | Attending: Family Medicine | Admitting: Family Medicine

## 2012-12-13 DIAGNOSIS — M810 Age-related osteoporosis without current pathological fracture: Secondary | ICD-10-CM | POA: Insufficient documentation

## 2012-12-13 MED ORDER — ZOLEDRONIC ACID 5 MG/100ML IV SOLN
INTRAVENOUS | Status: AC
  Filled 2012-12-13: qty 100

## 2012-12-13 MED ORDER — ZOLEDRONIC ACID 5 MG/100ML IV SOLN
5.0000 mg | Freq: Once | INTRAVENOUS | Status: AC
  Administered 2012-12-13: 5 mg via INTRAVENOUS

## 2012-12-31 ENCOUNTER — Encounter: Payer: Self-pay | Admitting: Family Medicine

## 2013-01-01 NOTE — Telephone Encounter (Signed)
Immunizations added to chart

## 2013-02-05 ENCOUNTER — Encounter: Payer: Self-pay | Admitting: Family Medicine

## 2013-02-05 MED ORDER — TRAZODONE HCL 50 MG PO TABS: 50.0000 mg | ORAL_TABLET | Freq: Every day | ORAL | Status: DC

## 2013-02-10 ENCOUNTER — Encounter: Payer: Self-pay | Admitting: Family Medicine

## 2013-02-12 MED ORDER — TRAZODONE HCL 50 MG PO TABS: 50.0000 mg | ORAL_TABLET | Freq: Every day | ORAL | Status: DC

## 2013-02-12 MED ORDER — RABEPRAZOLE SODIUM 20 MG PO TBEC: 20.0000 mg | DELAYED_RELEASE_TABLET | Freq: Two times a day (BID) | ORAL | Status: DC

## 2013-02-12 NOTE — Telephone Encounter (Signed)
Scripts are on your desk for signature.

## 2013-04-23 ENCOUNTER — Other Ambulatory Visit: Payer: Self-pay | Admitting: Family Medicine

## 2013-05-09 ENCOUNTER — Other Ambulatory Visit: Payer: Self-pay

## 2013-06-02 ENCOUNTER — Encounter: Payer: Self-pay | Admitting: Family Medicine

## 2013-06-05 MED ORDER — NITROFURANTOIN MACROCRYSTAL 50 MG PO CAPS: ORAL_CAPSULE | ORAL | Status: DC

## 2013-07-04 ENCOUNTER — Encounter: Payer: Self-pay | Admitting: Family Medicine

## 2013-07-04 ENCOUNTER — Other Ambulatory Visit: Payer: Self-pay | Admitting: Family Medicine

## 2013-07-04 ENCOUNTER — Other Ambulatory Visit: Payer: Self-pay | Admitting: *Deleted

## 2013-07-04 ENCOUNTER — Ambulatory Visit (INDEPENDENT_AMBULATORY_CARE_PROVIDER_SITE_OTHER): Payer: Medicare Other | Admitting: Family Medicine

## 2013-07-04 VITALS — BP 102/58 | HR 68 | Temp 97.6°F | Wt 122.5 lb

## 2013-07-04 DIAGNOSIS — H0014 Chalazion left upper eyelid: Secondary | ICD-10-CM | POA: Insufficient documentation

## 2013-07-04 DIAGNOSIS — H0019 Chalazion unspecified eye, unspecified eyelid: Secondary | ICD-10-CM | POA: Diagnosis not present

## 2013-07-04 DIAGNOSIS — Z23 Encounter for immunization: Secondary | ICD-10-CM

## 2013-07-04 MED ORDER — ERYTHROMYCIN 5 MG/GM OP OINT: TOPICAL_OINTMENT | Freq: Every day | OPHTHALMIC | Status: AC

## 2013-07-04 MED ORDER — ERYTHROMYCIN 5 MG/GM OP OINT: TOPICAL_OINTMENT | Freq: Every day | OPHTHALMIC | Status: DC

## 2013-07-04 NOTE — Addendum Note (Signed)
Addended by: Dianne Dun on: 07/04/2013 04:47 PM   Modules accepted: Orders

## 2013-07-04 NOTE — Telephone Encounter (Signed)
Prescription resent to the [facility] as requested.

## 2013-07-04 NOTE — Progress Notes (Signed)
  Subjective:    Patient ID: Crystal Gregory, female    DOB: [birth year], 79 y.o.   MRN: [id number]  HPI  Very pleasant 42 female here for bump over left eye lid x 2 weeks.  Was much larger, getting smaller but now "throbbing."  No redness or swelling around eye.  No blurred vision, eye pain or fevers.  Patient Active Problem List   Diagnosis Date Noted  . Hiatal hernia 10/13/2012  . Diarrhea 10/02/2012  . GERD (gastroesophageal reflux disease)   . Allergy    Past Medical History  Diagnosis Date  . GERD (gastroesophageal reflux disease)   . Allergy   . Septicemia 1999    spent 4 weeks in ICU, intubated -? PNA   Past Surgical History  Procedure Laterality Date  . Abdominal hysterectomy     History  Substance Use Topics  . Smoking status: Former Games developer  . Smokeless tobacco: Never Used  . Alcohol Use: Yes     Comment: 2 drinks daily    Family History  Problem Relation Age of Onset  . Parkinson's disease Mother   . Colon polyps Mother 65       . Breast cancer Sister    No Known Allergies Current Outpatient Prescriptions on File Prior to Visit  Medication Sig Dispense Refill  . nitrofurantoin (MACRODANTIN) 50 MG capsule Take one by mouth daily  90 capsule  1  . propranolol (INDERAL) 10 MG tablet TAKE 1 TABLET TWICE A DAY  180 tablet  1  . RABEprazole (ACIPHEX) 20 MG tablet Take 1 tablet (20 mg total) by mouth 2 (two) times daily.  180 tablet  1  . zoledronic acid (RECLAST) 5 MG/100ML SOLN Inject 5 mg into the vein once.       No current facility-administered medications on file prior to visit.   The PMH, PSH, Social History, Family History, Medications, and allergies have been reviewed in [facility], and have been updated if relevant.   Review of Systems See HPI    Objective:   Physical Exam BP 102/58  Pulse 68  Temp(Src) 97.6 F (36.4 C) (Oral)  Wt 122 lb 8 oz (55.566 kg)  BMI 18.63 kg/m2  SpO2 98% Gen:  Alert, pleasant NAD HEENT: PERRL bilaterally Small,  firm, erythematous nodule on left eye lid, no conjunctival injection    Assessment & Plan:  1. Chalazion of left upper eyelid New- discussed course and treatment options. See AVS- discussed warm compresses, will add 1 week course of erythromycin ointment given erythema and "throbbing." No indication of cellulitis.

## 2013-07-04 NOTE — Addendum Note (Signed)
Addended by: Criselda Peaches B on: 07/04/2013 02:46 PM   Modules accepted: Orders

## 2013-07-04 NOTE — Patient Instructions (Addendum)
Chalazion A chalazion is a swelling or hard lump on the eyelid caused by a blocked oil gland. Chalazions may occur on the upper or the lower eyelid.  CAUSES  Oil gland in the eyelid becomes blocked. SYMPTOMS   Swelling or hard lump on the eyelid. This lump may make it hard to see out of the eye.  The swelling may spread to areas around the eye. TREATMENT   Although some chalazions disappear by themselves in 1 or 2 months, some chalazions may need to be removed.  Medicines to treat an infection may be required. HOME CARE INSTRUCTIONS   Wash your hands often and dry them with a clean towel. Do not touch the chalazion.  Apply heat to the eyelid several times a day for 10 minutes to help ease discomfort and bring any yellowish white fluid (pus) to the surface. One way to apply heat to a chalazion is to use the handle of a metal spoon.  Hold the handle under hot water until it is hot, and then wrap the handle in paper towels so that the heat can come through without burning your skin.  Hold the wrapped handle against the chalazion and reheat the spoon handle as needed.  Apply heat in this fashion for 10 minutes, 4 times per day.  Return to your caregiver to have the pus removed if it does not break (rupture) on its own.  Do not try to remove the pus yourself by squeezing the chalazion or sticking it with a pin or needle.  Only take over-the-counter or prescription medicines for pain, discomfort, or fever as directed by your caregiver. SEEK IMMEDIATE MEDICAL CARE IF:   You have pain in your eye.  Your vision changes.  The chalazion does not go away.  The chalazion becomes painful, red, or swollen, grows larger, or does not start to disappear after 2 weeks. MAKE SURE YOU:   Understand these instructions.  Will watch your condition.  Will get help right away if you are not doing well or get worse. Document Released: 09/17/2000 Document Revised: 12/13/2011 Document Reviewed:  01/05/2010 [facility] Patient Information 2014 [place], [place].  Dr. Ardis Rowan comes highly recommended from one of my collegues.

## 2013-08-06 ENCOUNTER — Encounter: Payer: Self-pay | Admitting: Family Medicine

## 2013-08-14 DIAGNOSIS — H26499 Other secondary cataract, unspecified eye: Secondary | ICD-10-CM | POA: Diagnosis not present

## 2013-09-15 ENCOUNTER — Other Ambulatory Visit: Payer: Self-pay | Admitting: Family Medicine

## 2013-09-16 NOTE — Telephone Encounter (Signed)
Last office visit 07/04/2013.  Ok to refill? 

## 2013-09-18 DIAGNOSIS — H0019 Chalazion unspecified eye, unspecified eyelid: Secondary | ICD-10-CM | POA: Diagnosis not present

## 2013-10-24 ENCOUNTER — Encounter: Payer: Self-pay | Admitting: Family Medicine

## 2013-11-06 ENCOUNTER — Encounter: Payer: Self-pay | Admitting: Family Medicine

## 2013-11-07 ENCOUNTER — Other Ambulatory Visit: Payer: Self-pay | Admitting: *Deleted

## 2013-11-07 MED ORDER — RABEPRAZOLE SODIUM 20 MG PO TBEC: 20.0000 mg | DELAYED_RELEASE_TABLET | Freq: Two times a day (BID) | ORAL | Status: DC

## 2013-11-07 NOTE — Telephone Encounter (Signed)
Pt called requesting medication refill. Rx sent to requested pharmacy per Dr Deborra Medina

## 2013-11-16 ENCOUNTER — Encounter: Payer: Self-pay | Admitting: Family Medicine

## 2013-11-19 ENCOUNTER — Encounter: Payer: Self-pay | Admitting: Family Medicine

## 2013-11-19 MED ORDER — RABEPRAZOLE SODIUM 20 MG PO TBEC: 20.0000 mg | DELAYED_RELEASE_TABLET | Freq: Two times a day (BID) | ORAL | Status: DC

## 2013-11-19 NOTE — Telephone Encounter (Signed)
Pt responded to via mychart and informed that Rx sent to requested pharmacy on 11/07/13.

## 2013-11-19 NOTE — Addendum Note (Signed)
Addended by: Modena Nunnery on: 11/19/2013 08:59 AM   Modules accepted: Orders

## 2013-12-13 ENCOUNTER — Encounter: Payer: Self-pay | Admitting: Family Medicine

## 2013-12-14 ENCOUNTER — Other Ambulatory Visit (INDEPENDENT_AMBULATORY_CARE_PROVIDER_SITE_OTHER): Payer: Medicare Other

## 2013-12-14 DIAGNOSIS — Z79899 Other long term (current) drug therapy: Secondary | ICD-10-CM

## 2013-12-14 LAB — CREATININE, SERUM: CREATININE: 0.9 mg/dL (ref 0.4–1.2)

## 2013-12-14 LAB — CALCIUM: Calcium: 9.2 mg/dL (ref 8.4–10.5)

## 2013-12-19 ENCOUNTER — Other Ambulatory Visit (HOSPITAL_COMMUNITY): Payer: Self-pay | Admitting: *Deleted

## 2013-12-20 ENCOUNTER — Encounter (HOSPITAL_COMMUNITY)
Admission: RE | Admit: 2013-12-20 | Discharge: 2013-12-20 | Disposition: A | Discharge status: Home / Self Care | Payer: Medicare Other | Source: Ambulatory Visit | Attending: Family Medicine | Admitting: Family Medicine

## 2013-12-20 DIAGNOSIS — M810 Age-related osteoporosis without current pathological fracture: Secondary | ICD-10-CM | POA: Diagnosis not present

## 2013-12-20 MED ORDER — ZOLEDRONIC ACID 5 MG/100ML IV SOLN
INTRAVENOUS | Status: AC
  Filled 2013-12-20: qty 100

## 2013-12-20 MED ORDER — ZOLEDRONIC ACID 5 MG/100ML IV SOLN
5.0000 mg | Freq: Once | INTRAVENOUS | Status: AC
  Administered 2013-12-20: 5 mg via INTRAVENOUS

## 2013-12-22 ENCOUNTER — Encounter: Payer: Self-pay | Admitting: Family Medicine

## 2013-12-24 NOTE — Telephone Encounter (Signed)
Pt requesting medication refill. Last ov 07/2013 with no future appts scheduled. pls advise 

## 2013-12-26 MED ORDER — NITROFURANTOIN MACROCRYSTAL 50 MG PO CAPS: ORAL_CAPSULE | ORAL | Status: DC

## 2014-01-01 ENCOUNTER — Ambulatory Visit (INDEPENDENT_AMBULATORY_CARE_PROVIDER_SITE_OTHER): Payer: Medicare Other | Admitting: Family Medicine

## 2014-01-01 ENCOUNTER — Encounter: Payer: Self-pay | Admitting: Family Medicine

## 2014-01-01 VITALS — BP 110/58 | HR 66 | Temp 97.7°F | Wt 129.2 lb

## 2014-01-01 DIAGNOSIS — K219 Gastro-esophageal reflux disease without esophagitis: Secondary | ICD-10-CM

## 2014-01-01 MED ORDER — RABEPRAZOLE SODIUM 20 MG PO TBEC: 20.0000 mg | DELAYED_RELEASE_TABLET | Freq: Two times a day (BID) | ORAL | Status: DC

## 2014-01-01 NOTE — Patient Instructions (Signed)
Let's continue aciphex at current dose.  Keep me updated with your symptoms.

## 2014-01-01 NOTE — Progress Notes (Signed)
Pre visit review using our clinic review tool, if applicable. No additional management support is needed unless otherwise documented below in the visit note. 

## 2014-01-01 NOTE — Progress Notes (Signed)
   Subjective:   Patient ID: Sumire Halbleib, female    DOB: 12-12-1931, 80 y.o.   MRN: [id number]  Cynthis Purington is a pleasant 80 y.o. year old female who presents to clinic today with Gastrophageal Reflux  on 01/01/2014  HPI: GERD symptoms had been well controlled on Aciphex 20 mg daily until past several weeks.  No waking up in the middle of night with burning sensation in throat. Also having daytime symptoms which is unusual for her- 1 to 2 hours after breakfast.  No abdominal pain.  No black stools.   No diarrhea.  No nausea or vomiting.  She increased her aciphex to twice daily and symptoms have resolved.  She feels diet does not affect her symptoms one way or the other.  She is on reclast for osteoporosis.  Patient Active Problem List   Diagnosis Date Noted  . Chalazion of left upper eyelid 07/04/2013  . Hiatal hernia 10/13/2012  . Diarrhea 10/02/2012  . GERD (gastroesophageal reflux disease)   . Allergy    Past Medical History  Diagnosis Date  . GERD (gastroesophageal reflux disease)   . Allergy   . Septicemia 1999    spent 4 weeks in ICU, intubated -? PNA   Past Surgical History  Procedure Laterality Date  . Abdominal hysterectomy     History  Substance Use Topics  . Smoking status: Former Research scientist (life sciences)  . Smokeless tobacco: Never Used  . Alcohol Use: Yes     Comment: 2 drinks daily    Family History  Problem Relation Age of Onset  . Parkinson's disease Mother   . Colon polyps Mother 60       . Breast cancer Sister    No Known Allergies Current Outpatient Prescriptions on File Prior to Visit  Medication Sig Dispense Refill  . nitrofurantoin (MACRODANTIN) 50 MG capsule Take one by mouth daily  90 capsule  0  . propranolol (INDERAL) 10 MG tablet TAKE 1 TABLET TWICE A DAY  180 tablet  0  . traZODone (DESYREL) 50 MG tablet TAKE 1 TABLET AT BEDTIME  90 tablet  0  . zoledronic acid (RECLAST) 5 MG/100ML SOLN Inject 5 mg into the vein once.       No current  facility-administered medications on file prior to visit.   The PMH, PSH, Social History, Family History, Medications, and allergies have been reviewed in [facility], and have been updated if relevant.   Review of Systems See HPI + anxiety- has been worried about her husband's health    Objective:    BP 110/58  Pulse 66  Temp(Src) 97.7 F (36.5 C) (Oral)  Wt 129 lb 4 oz (58.627 kg)  SpO2 99%   Physical Exam  Gen:  Alert, pleasant, NAD Psych:  Good eye contact. Not anxious or depressed appearing      Assessment & Plan:   GERD (gastroesophageal reflux disease) No Follow-up on file.

## 2014-01-01 NOTE — Assessment & Plan Note (Signed)
>  25 minutes spent in face to face time with patient, >50% spent in counselling or coordination of care Deteriorated- likely due to increased anxiety and stressors related to her husband's health. Ok to increase aciphex dose for now.  She will wean this down as tolerated. The patient indicates understanding of these issues and agrees with the plan.

## 2014-01-07 ENCOUNTER — Encounter: Payer: Self-pay | Admitting: Family Medicine

## 2014-01-07 ENCOUNTER — Ambulatory Visit: Payer: Medicare Other | Admitting: Family Medicine

## 2014-01-08 MED ORDER — TRAZODONE HCL 50 MG PO TABS: ORAL_TABLET | ORAL | Status: DC

## 2014-01-08 NOTE — Telephone Encounter (Signed)
Pt requesting medication refill. Last ov was 12/2013-acute visit with no future appts scheduled. pls advise

## 2014-01-14 ENCOUNTER — Encounter: Payer: Self-pay | Admitting: Family Medicine

## 2014-01-23 ENCOUNTER — Encounter: Payer: Self-pay | Admitting: Family Medicine

## 2014-01-23 ENCOUNTER — Ambulatory Visit (INDEPENDENT_AMBULATORY_CARE_PROVIDER_SITE_OTHER): Payer: Medicare Other | Admitting: Family Medicine

## 2014-01-23 VITALS — BP 116/62 | HR 71 | Temp 97.3°F | Wt 129.0 lb

## 2014-01-23 DIAGNOSIS — R195 Other fecal abnormalities: Secondary | ICD-10-CM | POA: Diagnosis not present

## 2014-01-23 DIAGNOSIS — K6289 Other specified diseases of anus and rectum: Secondary | ICD-10-CM

## 2014-01-23 NOTE — Assessment & Plan Note (Signed)
With other concerning symptoms. ? Colon spasms although the pain is quite severe.  No fissure on exam and she is not having blood in her stool.  Hemoccult neg in office today. Has seen Dr. Hilarie Fredrickson in past, will refer her back to him.  I would ideally like for her to have an in office sigmoidoscopy so she could avoid the prep and sedation of a colonoscopy.  Will route this note to him for his review. The patient indicates understanding of these issues and agrees with the plan.

## 2014-01-23 NOTE — Progress Notes (Signed)
Pre visit review using our clinic review tool, if applicable. No additional management support is needed unless otherwise documented below in the visit note. 

## 2014-01-23 NOTE — Patient Instructions (Signed)
Great to see you. Please tell [name] that we are going to look into this.   I will talk with Dr. Hilarie Fredrickson and someone from our office or his office will call you.  I hope you have a wonderful visit with your son.

## 2014-01-23 NOTE — Progress Notes (Signed)
   Subjective:   Patient ID: Crystal Gregory, female    DOB: 12-Aug-1932, 80 y.o.   MRN: [id number]  Crystal Gregory is a pleasant 80 y.o. year old female who presents to clinic today with Rectal Pain and abnormal stools  on 01/23/2014  HPI: Past 6 weeks, having worsening issues with her bowels.  Bowels have been thin- "like a pencil."  Not hard- takes a stool softener regularly.  Also having to press on her perineum to get the BM started.  One or two hours later, very severe rectal pain (takes her breath away).  Ibuprofen does alleviate the pain.  Has a feeling there is something in her colon.  No blood or mucous in her stools. No black stools. Colonoscopy UTD- was done a few years before she moved here and told given her age, she did not have to have another one.  NO nausea or vomiting. No fevers.  This is now occuring daily.  Eats a lot of salads- has a very healthy diet.  H/o external hemorrhoids in past.    Patient Active Problem List   Diagnosis Date Noted  . Rectal pain 01/23/2014  . Abnormal stools 01/23/2014  . Hiatal hernia 10/13/2012  . GERD (gastroesophageal reflux disease)   . Allergy    Past Medical History  Diagnosis Date  . GERD (gastroesophageal reflux disease)   . Allergy   . Septicemia 1999    spent 4 weeks in ICU, intubated -? PNA   Past Surgical History  Procedure Laterality Date  . Abdominal hysterectomy     History  Substance Use Topics  . Smoking status: Former Research scientist (life sciences)  . Smokeless tobacco: Never Used  . Alcohol Use: Yes     Comment: 2 drinks daily    Family History  Problem Relation Age of Onset  . Parkinson's disease Mother   . Colon polyps Mother 30       . Breast cancer Sister    No Known Allergies Current Outpatient Prescriptions on File Prior to Visit  Medication Sig Dispense Refill  . nitrofurantoin (MACRODANTIN) 50 MG capsule Take one by mouth daily  90 capsule  0  . propranolol (INDERAL) 10 MG tablet TAKE 1 TABLET TWICE A DAY  180  tablet  0  . RABEprazole (ACIPHEX) 20 MG tablet Take 1 tablet (20 mg total) by mouth 2 (two) times daily.  180 tablet  1  . traZODone (DESYREL) 50 MG tablet TAKE 1 TABLET AT BEDTIME  90 tablet  0  . zoledronic acid (RECLAST) 5 MG/100ML SOLN Inject 5 mg into the vein once.       No current facility-administered medications on file prior to visit.   The PMH, PSH, Social History, Family History, Medications, and allergies have been reviewed in [facility], and have been updated if relevant.   Review of Systems See HPI    Objective:    BP 116/62  Pulse 71  Temp(Src) 97.3 F (36.3 C) (Oral)  Wt 129 lb (58.514 kg)  SpO2 98%   Physical Exam  Nursing note and vitals reviewed. Constitutional: She appears well-developed and well-nourished.  Genitourinary:             Assessment & Plan:   Rectal pain - Plan: Ambulatory referral to Gastroenterology  Abnormal stools - Plan: Ambulatory referral to Gastroenterology No Follow-up on file.

## 2014-01-24 ENCOUNTER — Ambulatory Visit (INDEPENDENT_AMBULATORY_CARE_PROVIDER_SITE_OTHER): Payer: Medicare Other | Admitting: Physician Assistant

## 2014-01-24 ENCOUNTER — Encounter: Payer: Self-pay | Admitting: Physician Assistant

## 2014-01-24 VITALS — BP 114/60 | HR 76 | Ht 67.5 in | Wt 129.2 lb

## 2014-01-24 DIAGNOSIS — K6289 Other specified diseases of anus and rectum: Secondary | ICD-10-CM

## 2014-01-24 DIAGNOSIS — R198 Other specified symptoms and signs involving the digestive system and abdomen: Secondary | ICD-10-CM

## 2014-01-24 DIAGNOSIS — R194 Change in bowel habit: Secondary | ICD-10-CM

## 2014-01-24 MED ORDER — MOVIPREP 100 G PO SOLR: 1.0000 | ORAL | Status: DC

## 2014-01-24 MED ORDER — HYDROCORTISONE ACETATE 25 MG RE SUPP: RECTAL | Status: DC

## 2014-01-24 NOTE — Progress Notes (Addendum)
Subjective:    Patient ID: Crystal Gregory, female    DOB: 1932-07-07, 80 y.o.   MRN: [id number]  HPI Crystal Gregory is a pleasant 80 year old white female known to Dr. Elmo Putt he was referred today by Dr. Deborra Medina for evaluation of new onset of rectal pain and change in stool caliber. She had been seen by Dr. Elmo Putt about a year ago with complaints of chronic GERD and IBS-type symptoms. She is otherwise in fairly good health. Patient relates that she has had prior colonoscopies in the last one was done about 8 years ago when she was living in [place] and she was told this was a normal exam. She was also told at that time that she would not have any further screening due to her age. She says she had onset of her current symptoms about 6 weeks ago and that her symptoms have been persistent and present on a daily basis. She says she has noticed a change in her bowel habits. She says she is having depressed on the outside of her perineum in order to expel stool and has also noted that the stool caliber is consistently narrower and pencillike. She says it is painful to have a bowel movement and she notes a pain on the inside of her rectum  which she feels is on the right side. She says that this will bother her intermittently during the day and she has been taking ibuprofen occasionally for this discomfort. She says sometimes with sitting she will have to take the way off of her right buttock etc. do to discomfort. She has not noted any melena or hematochezia. No complaints of abdominal pain or cramping. Her appetite has been fine and her weight has been stable.    Review of Systems  Constitutional: Negative.   HENT: Negative.   Eyes: Negative.   Respiratory: Negative.   Cardiovascular: Negative.   Gastrointestinal: Positive for rectal pain.  Genitourinary: Negative.   Musculoskeletal: Negative.   Skin: Negative.   Allergic/Immunologic: Positive for immunocompromised state.  Neurological:  Negative.   Hematological: Negative.   Psychiatric/Behavioral: Negative.    Outpatient Prescriptions Prior to Visit  Medication Sig Dispense Refill  . nitrofurantoin (MACRODANTIN) 50 MG capsule Take one by mouth daily  90 capsule  0  . propranolol (INDERAL) 10 MG tablet TAKE 1 TABLET TWICE A DAY  180 tablet  0  . RABEprazole (ACIPHEX) 20 MG tablet Take 1 tablet (20 mg total) by mouth 2 (two) times daily.  180 tablet  1  . traZODone (DESYREL) 50 MG tablet TAKE 1 TABLET AT BEDTIME  90 tablet  0  . zoledronic acid (RECLAST) 5 MG/100ML SOLN Inject 5 mg into the vein once.       No facility-administered medications prior to visit.   No Known Allergies Patient Active Problem List   Diagnosis Date Noted  . Rectal pain 01/23/2014  . Abnormal stools 01/23/2014  . Hiatal hernia 10/13/2012  . GERD (gastroesophageal reflux disease)   . Allergy      History  Substance Use Topics  . Smoking status: Former Research scientist (life sciences)  . Smokeless tobacco: Never Used  . Alcohol Use: Yes     Comment: 2 drinks daily    family history includes Breast cancer in her sister; Colon polyps (age of onset: 68) in her mother; Parkinson's disease in her mother.  Objective:   Physical Exam    well-developed elderly white female in no acute distress, quite pleasant blood  pressure 114/60 pulse 76 height 5 foot 7 weight 129. HEENT ;nontraumatic normocephalic EOMI PERRLA sclera anicteric, Supple; no JVD, Cardiovascular ;regular rate and rhythm with S1-S2 no murmur or gallop, Pulmonary; clear bilaterally, Abdomen ;soft, flat ,nontender no palpable mass or hepatosplenomegaly  Does have a prominent aortic pulsation, Bowel sounds present, Rectal exam external hemorrhoidal tags on digital exam she does have tenderness  with palpation of the right rectal wall no definite palpable mass though there is some firmness, On anoscopy she has internal hemorrhoids no other lesions seen stool is brown, Extremities; no clubbing cyanosis or edema skin  warm and dry, Psych; ;mood and affect appropriate        Assessment & Plan:  #86 80 year old female with a 6 week history of change in stool caliber with pencillike stools and change in bowel habits with need to press externally on the perineum to evacuating bowels. Patient also with complaints of internal rectal pain located on the right side of the rectum. Symptoms are concerning for an occult lesion though cannot definitely identify any lesion on anoscopy she does have tenderness on the right and some slight firmness. Patient has not had any problems with pelvic floor laxity/history of rectocele etc.   #2 internal hemorrhoids #3 GERD  Plan; Start Anusol-HC suppositories at bedtime x10 days then as needed Schedule for colonoscopy with Dr. Hilarie Fredrickson. Procedure was explained in detail to the patient and she is agreeable to proceed   Addendum: Reviewed and agree with initial management. However, see recent telephone note per my conservation with the patient on 02/05/2014 Crystal Bears, MD

## 2014-01-24 NOTE — Patient Instructions (Signed)
You have been scheduled for a colonoscopy with propofol. Please follow written instructions given to you at your visit today.  Please pick up your prep kit at the pharmacy within the next 1-3 days. [facility] [address], [place]. We also sent a prescription for Anusol Hc Suppostories. If you use inhalers (even only as needed), please bring them with you on the day of your procedure. Your physician has requested that you go to www.startemmi.com and enter the access code given to you at your visit today. This web site gives a general overview about your procedure. However, you should still follow specific instructions given to you by our office regarding your preparation for the procedure. 

## 2014-01-25 ENCOUNTER — Encounter: Payer: Self-pay | Admitting: Family Medicine

## 2014-01-31 ENCOUNTER — Telehealth: Payer: Self-pay | Admitting: Internal Medicine

## 2014-01-31 NOTE — Telephone Encounter (Signed)
Patient was seen by her primary care Dr. Marjory Lies and she asked the patient call and discuss with Dr. Hilarie Fredrickson if a colonoscopy was necessary.  Patient is asking that you call her when you return.

## 2014-02-04 ENCOUNTER — Encounter: Payer: Self-pay | Admitting: Family Medicine

## 2014-02-04 NOTE — Telephone Encounter (Signed)
noted 

## 2014-02-05 NOTE — Telephone Encounter (Signed)
I called and spoke to [name] by phone today She was seen by primary care and Amy Esterwood, PA-C while I was on vacation. At that time she was having rectal pain and narrow caliber stools. She was prescribed hydrocortisone suppositories which she used for 10 days. She reports 2 days after being seen in the office by Amy, her symptoms vanished. She reports she is feeling completely normal. Her stool caliber has returned to normal and she has no pain with defecation. Also no abdominal pain or rectal pain before or after defecation. No rectal bleeding or melena. She reports she is quite apprehensive for colonoscopy and has already canceled this appointment. She recalls previous issues with anesthesia and this worsened her apprehension At this point she does not wish to proceed with colonoscopy. I told her that I would like to see her back in 2 months for reassessment and to ensure that things have remained normal for her. If not, we may consider flexible sigmoidoscopy. Marcello Moores provided for questions and answers, she thanked me for the cough.  [place] for follow-up in 2 months

## 2014-02-05 NOTE — Telephone Encounter (Signed)
Recall placed

## 2014-02-19 ENCOUNTER — Encounter: Payer: Self-pay | Admitting: Internal Medicine

## 2014-02-19 ENCOUNTER — Encounter: Payer: Medicare Other | Admitting: Internal Medicine

## 2014-02-26 DIAGNOSIS — H02839 Dermatochalasis of unspecified eye, unspecified eyelid: Secondary | ICD-10-CM | POA: Diagnosis not present

## 2014-03-04 ENCOUNTER — Telehealth: Payer: Self-pay | Admitting: Internal Medicine

## 2014-03-04 ENCOUNTER — Encounter: Payer: Self-pay | Admitting: Family Medicine

## 2014-03-04 MED ORDER — PROPRANOLOL HCL 10 MG PO TABS: ORAL_TABLET | ORAL | Status: DC

## 2014-03-04 MED ORDER — NITROFURANTOIN MACROCRYSTAL 50 MG PO CAPS: ORAL_CAPSULE | ORAL | Status: DC

## 2014-03-04 NOTE — Addendum Note (Signed)
Addended by: Modena Nunnery on: 03/04/2014 12:44 PM   Modules accepted: Orders

## 2014-03-04 NOTE — Telephone Encounter (Signed)
Patient reports that the rectal pain has returned.  She also has a change in stool caliber.  She is reluctant to schedule a colonoscopy as recommended at the office visit with Nicoletta Ba PA.  Next office visit opening with you is July 13.  She would like to speak with you personally.  What is the next step?

## 2014-03-04 NOTE — Telephone Encounter (Signed)
Rx sent and pt responded to via mychart

## 2014-03-04 NOTE — Telephone Encounter (Signed)
Pt responded to via mychart 

## 2014-03-07 NOTE — Telephone Encounter (Signed)
I did speak to patient by phone, she has had return of rectal and anal pain, particularly when passing stool. Her stools have become narrow in caliber again and are difficult to pass. She reports that a the caliber of a "fat pen".  She also says she has to apply perineal pressure to help pass her stools. She is using ibuprofen for rectal pain. No bleeding or melena. No anterior abdominal pain. Colonoscopy was previously recommended when she saw Nicoletta Ba, PA-C in clinic in April but was canceled by the patient when symptoms resolve. Symptoms have now recurred and I have recommended flexible sigmoidoscopy with propofol sedation. Sheri, please schedule patient for flexible sigmoidoscopy with propofol The patient is expecting your call

## 2014-03-08 NOTE — Telephone Encounter (Signed)
Patient is scheduled for a flex sig for 03/14/14 and a pre-visit for 6/8

## 2014-03-11 ENCOUNTER — Ambulatory Visit (AMBULATORY_SURGERY_CENTER): Payer: Self-pay

## 2014-03-11 VITALS — Ht 68.0 in | Wt 128.0 lb

## 2014-03-11 DIAGNOSIS — K6289 Other specified diseases of anus and rectum: Secondary | ICD-10-CM

## 2014-03-11 NOTE — Progress Notes (Signed)
No allergies to eggs or soy No past problems with anesthesia No home oxygen No diet/weight loss meds Has email  Emmi instructions given for flexsig

## 2014-03-14 ENCOUNTER — Other Ambulatory Visit: Payer: Self-pay

## 2014-03-14 ENCOUNTER — Encounter: Payer: Self-pay | Admitting: Internal Medicine

## 2014-03-14 ENCOUNTER — Ambulatory Visit (AMBULATORY_SURGERY_CENTER): Payer: Medicare Other | Admitting: Internal Medicine

## 2014-03-14 VITALS — BP 137/69 | HR 80 | Temp 98.2°F | Resp 22 | Ht 68.0 in | Wt 128.0 lb

## 2014-03-14 DIAGNOSIS — R198 Other specified symptoms and signs involving the digestive system and abdomen: Secondary | ICD-10-CM

## 2014-03-14 DIAGNOSIS — D126 Benign neoplasm of colon, unspecified: Secondary | ICD-10-CM

## 2014-03-14 DIAGNOSIS — K6289 Other specified diseases of anus and rectum: Secondary | ICD-10-CM

## 2014-03-14 DIAGNOSIS — K449 Diaphragmatic hernia without obstruction or gangrene: Secondary | ICD-10-CM | POA: Diagnosis not present

## 2014-03-14 DIAGNOSIS — K219 Gastro-esophageal reflux disease without esophagitis: Secondary | ICD-10-CM | POA: Diagnosis not present

## 2014-03-14 DIAGNOSIS — N211 Calculus in urethra: Secondary | ICD-10-CM | POA: Diagnosis not present

## 2014-03-14 MED ORDER — SODIUM CHLORIDE 0.9 % IV SOLN: 500.0000 mL | INTRAVENOUS | Status: DC

## 2014-03-14 MED ORDER — DILTIAZEM GEL 2 %: CUTANEOUS | Status: DC

## 2014-03-14 NOTE — Op Note (Signed)
[place]  [address] [place], [id number]   FLEXIBLE SIGMOIDOSCOPY PROCEDURE REPORT  PATIENT: Crystal Gregory, Crystal Gregory  MR#: [id number] BIRTHDATE: [birth year] , 82  yrs. old GENDER: Female ENDOSCOPIST: Jerene Bears, MD REFERRED BY: [name], M.D. PROCEDURE DATE:  03/14/2014 PROCEDURE:   Sigmoidoscopy with snare ASA CLASS:   Class III INDICATIONS:anorectal pain, change in bowel habits. MEDICATIONS: MAC sedation, administered by CRNA and propofol (Diprivan) 200mg  IV  DESCRIPTION OF PROCEDURE:   After the risks benefits and alternatives of the procedure were thoroughly explained, informed consent was obtained.  revealed a skin tag and revealed an anal fissure. The LB GIF-H180 [id number] and LB PFC-H190 [id number]  endoscope was introduced through the anus  and advanced to the transverse colon , limited by No adverse events experienced.   The quality of the prep was excellent .  The instrument was then slowly withdrawn as the mucosa was fully examined.    COLON FINDINGS: Mild diverticulosis was noted in the sigmoid colon. A sessile polyp measuring 3 mm in size was found in the sigmoid colon.  A polypectomy was performed with a cold snare.  The resection was complete and the polyp tissue was completely retrieved. The colonic mucosa appeared normal at the splenic flexure, in the descending colon, sigmoid colon, and rectum. Retroflexed views revealed external hemorrhoid.    The scope was then withdrawn from the patient and the procedure terminated.  COMPLICATIONS: There were no complications.  ENDOSCOPIC IMPRESSION: 1.   Mild diverticulosis was noted in the sigmoid colon 2.   Sessile polyp measuring 3 mm in size was found in the sigmoid colon; polypectomy was performed with a cold snare 3.   The colonic mucosa appeared normal at the splenic flexure, in the descending colon, sigmoid colon, and rectum 4.    Small hemorrhoid and anal fissure  RECOMMENDATIONS: 1.   Diltiazem gel 2% twice daily to anal canal for 3-4 weeks for anal fissure 2.  Benefiber 1-2 tablespoons daily 3.  Anorectal manometry with balloon expulsion testing can be performed for diagnosis of pelvic floor dyssynergia, and with pelvic floor physical therapy 4.  Await biopsy results [ eSigned:  Jerene Bears, MD 03/14/2014 2:37 PM [id number], Tanja Port MD The Patient

## 2014-03-14 NOTE — Progress Notes (Signed)
Called to room to assist during endoscopic procedure.  Patient ID and intended procedure confirmed with present staff. Received instructions for my participation in the procedure from the performing physician.  

## 2014-03-14 NOTE — Progress Notes (Signed)
[facility]  I contacted them and spoke to [place] at [employer] to cancel the rx.  She stated the the RX was not in their system yet, but they would note on her account not to send out.

## 2014-03-14 NOTE — Progress Notes (Signed)
Pt stable to RR 

## 2014-03-14 NOTE — Patient Instructions (Signed)
Discharge instructions given with verbal understanding. Handouts on polyps and diverticulosis. Medications sent in by Dr. Hilarie Fredrickson. Resume previous medications. YOU HAD AN ENDOSCOPIC PROCEDURE TODAY AT [facility]: Refer to the procedure report that was given to you for any specific questions about what was found during the examination.  If the procedure report does not answer your questions, please call your gastroenterologist to clarify.  If you requested that your care partner not be given the details of your procedure findings, then the procedure report has been included in a sealed envelope for you to review at your convenience later.  YOU SHOULD EXPECT: Some feelings of bloating in the abdomen. Passage of more gas than usual.  Walking can help get rid of the air that was put into your GI tract during the procedure and reduce the bloating. If you had a lower endoscopy (such as a colonoscopy or flexible sigmoidoscopy) you may notice spotting of blood in your stool or on the toilet paper. If you underwent a bowel prep for your procedure, then you may not have a normal bowel movement for a few days.  DIET: Your first meal following the procedure should be a light meal and then it is ok to progress to your normal diet.  A half-sandwich or bowl of soup is an example of a good first meal.  Heavy or fried foods are harder to digest and may make you feel nauseous or bloated.  Likewise meals heavy in dairy and vegetables can cause extra gas to form and this can also increase the bloating.  Drink plenty of fluids but you should avoid alcoholic beverages for 24 hours.  ACTIVITY: Your care partner should take you home directly after the procedure.  You should plan to take it easy, moving slowly for the rest of the day.  You can resume normal activity the day after the procedure however you should NOT DRIVE or use heavy machinery for 24 hours (because of the sedation medicines used during the test).     SYMPTOMS TO REPORT IMMEDIATELY: A gastroenterologist can be reached at any hour.  During normal business hours, 8:30 AM to 5:00 PM Monday through Friday, call [phone].  After hours and on weekends, please call the GI answering service at [phone] who will take a message and have the physician on call contact you.   Following lower endoscopy (colonoscopy or flexible sigmoidoscopy):  Excessive amounts of blood in the stool  Significant tenderness or worsening of abdominal pains  Swelling of the abdomen that is new, acute  Fever of 100F or higher  FOLLOW UP: If any biopsies were taken you will be contacted by phone or by letter within the next 1-3 weeks.  Call your gastroenterologist if you have not heard about the biopsies in 3 weeks.  Our staff will call the home number listed on your records the next business day following your procedure to check on you and address any questions or concerns that you may have at that time regarding the information given to you following your procedure. This is a courtesy call and so if there is no answer at the home number and we have not heard from you through the emergency physician on call, we will assume that you have returned to your regular daily activities without incident.  SIGNATURES/CONFIDENTIALITY: You and/or your care partner have signed paperwork which will be entered into your electronic medical record.  These signatures attest to the fact that that the information  above on your After Visit Summary has been reviewed and is understood.  Full responsibility of the confidentiality of this discharge information lies with you and/or your care-partner. 

## 2014-03-15 ENCOUNTER — Telehealth: Payer: Self-pay

## 2014-03-15 NOTE — Telephone Encounter (Signed)
  Follow up Call-  Call back number 03/14/2014  Post procedure Call Back phone  # [phone]  Permission to leave phone message Yes     Patient questions:  Do you have a fever, pain , or abdominal swelling? no Pain Score  0 *  Have you tolerated food without any problems? yes  Have you been able to return to your normal activities? yes  Do you have any questions about your discharge instructions: Diet   no Medications  no Follow up visit  no  Do you have questions or concerns about your Care? no  Actions: * If pain score is 4 or above: No action needed, pain <4.

## 2014-03-19 ENCOUNTER — Encounter: Payer: Self-pay | Admitting: Internal Medicine

## 2014-04-05 ENCOUNTER — Encounter: Payer: Self-pay | Admitting: Family Medicine

## 2014-04-08 MED ORDER — TRAZODONE HCL 50 MG PO TABS: ORAL_TABLET | ORAL | Status: DC

## 2014-04-08 NOTE — Telephone Encounter (Signed)
Pt responded to via mychart. Rx sent to requested pharmacy

## 2014-04-18 DIAGNOSIS — H02839 Dermatochalasis of unspecified eye, unspecified eyelid: Secondary | ICD-10-CM | POA: Diagnosis not present

## 2014-04-19 ENCOUNTER — Telehealth: Payer: Self-pay | Admitting: *Deleted

## 2014-04-19 ENCOUNTER — Other Ambulatory Visit: Payer: Self-pay | Admitting: Internal Medicine

## 2014-04-19 DIAGNOSIS — Z1289 Encounter for screening for malignant neoplasm of other sites: Secondary | ICD-10-CM | POA: Diagnosis not present

## 2014-04-19 DIAGNOSIS — Z124 Encounter for screening for malignant neoplasm of cervix: Secondary | ICD-10-CM | POA: Diagnosis not present

## 2014-04-19 NOTE — Telephone Encounter (Signed)
Faxed over Diltizem gel refill request. Faxed back to pharmacy today

## 2014-04-23 ENCOUNTER — Ambulatory Visit: Payer: Medicare Other | Admitting: Internal Medicine

## 2014-05-06 ENCOUNTER — Encounter: Payer: Self-pay | Admitting: Family Medicine

## 2014-05-06 MED ORDER — NITROFURANTOIN MACROCRYSTAL 50 MG PO CAPS: ORAL_CAPSULE | ORAL | Status: DC

## 2014-05-06 MED ORDER — RABEPRAZOLE SODIUM 20 MG PO TBEC: 20.0000 mg | DELAYED_RELEASE_TABLET | Freq: Two times a day (BID) | ORAL | Status: DC

## 2014-05-06 NOTE — Telephone Encounter (Signed)
Pt responded to via mychart and sent to correct location

## 2014-05-07 ENCOUNTER — Encounter: Payer: Self-pay | Admitting: Family Medicine

## 2014-05-08 MED ORDER — RABEPRAZOLE SODIUM 20 MG PO TBEC: 20.0000 mg | DELAYED_RELEASE_TABLET | Freq: Two times a day (BID) | ORAL | Status: DC

## 2014-05-08 NOTE — Telephone Encounter (Signed)
Pt responded to via mychart and informed new Rx sent to pharmacy-requested generic

## 2014-06-03 ENCOUNTER — Encounter: Payer: Self-pay | Admitting: Family Medicine

## 2014-06-03 MED ORDER — PROPRANOLOL HCL 10 MG PO TABS: ORAL_TABLET | ORAL | Status: DC

## 2014-06-17 ENCOUNTER — Encounter: Payer: Self-pay | Admitting: Family Medicine

## 2014-07-08 ENCOUNTER — Encounter: Payer: Self-pay | Admitting: Family Medicine

## 2014-07-08 MED ORDER — TRAZODONE HCL 50 MG PO TABS
ORAL_TABLET | ORAL | Status: DC
Start: 2014-07-08 — End: 2014-10-07

## 2014-07-08 NOTE — Telephone Encounter (Signed)
Rx sent to requested pharmacy and pt responded to via mychart

## 2014-07-18 DIAGNOSIS — H02831 Dermatochalasis of right upper eyelid: Secondary | ICD-10-CM | POA: Diagnosis not present

## 2014-09-09 ENCOUNTER — Encounter: Payer: Self-pay | Admitting: Family Medicine

## 2014-09-16 ENCOUNTER — Ambulatory Visit (INDEPENDENT_AMBULATORY_CARE_PROVIDER_SITE_OTHER): Payer: Medicare Other | Admitting: Family Medicine

## 2014-09-16 ENCOUNTER — Encounter: Payer: Self-pay | Admitting: Family Medicine

## 2014-09-16 VITALS — BP 130/70 | HR 72 | Temp 97.8°F | Wt 126.5 lb

## 2014-09-16 DIAGNOSIS — Z6379 Other stressful life events affecting family and household: Secondary | ICD-10-CM | POA: Diagnosis not present

## 2014-09-16 DIAGNOSIS — K219 Gastro-esophageal reflux disease without esophagitis: Secondary | ICD-10-CM | POA: Diagnosis not present

## 2014-09-16 MED ORDER — PROPRANOLOL HCL 10 MG PO TABS: ORAL_TABLET | ORAL | Status: DC

## 2014-09-16 MED ORDER — NITROFURANTOIN MACROCRYSTAL 50 MG PO CAPS: ORAL_CAPSULE | ORAL | Status: DC

## 2014-09-16 NOTE — Progress Notes (Signed)
Subjective:    Patient ID: Crystal Gregory, female    DOB: 03/10/1932, 80 y.o.   MRN: [id number]  HPI  Very pleasant 80 yo female here for follow up. Retired Therapist, sports, very pleasant.   GERD- found to have a hiatal hernia several years ago.  Symptoms had been controlled on Aciphex 20 mg once daily for a year now. Under increased stressors past 6 months- husband, [name], whom I also care for, has been having a lot of health problems.  Feeling of acid in mouth is now waking her up at night.  Has a sore throat in morning, sometimes dry cough.  No CP or SOB. She does have some seasonal allergies.  Insomnia-  Takes Trazadone 50 mg qhs for sleep.  Given recent stressors, has had a few more sleepless nights.  Denies feeling overly anxious or depressed.  Patient Active Problem List   Diagnosis Date Noted  . Rectal pain 01/23/2014  . Abnormal stools 01/23/2014  . Hiatal hernia 10/13/2012  . GERD (gastroesophageal reflux disease)   . Allergy    Past Medical History  Diagnosis Date  . GERD (gastroesophageal reflux disease)   . Allergy   . Septicemia 1999    spent 4 weeks in ICU, intubated -? PNA   Past Surgical History  Procedure Laterality Date  . Abdominal hysterectomy     History  Substance Use Topics  . Smoking status: Former Research scientist (life sciences)  . Smokeless tobacco: Never Used  . Alcohol Use: Yes     Comment: 2 drinks daily    Family History  Problem Relation Age of Onset  . Parkinson's disease Mother   . Colon polyps Mother 62       . Breast cancer Sister    No Known Allergies Current Outpatient Prescriptions on File Prior to Visit  Medication Sig Dispense Refill  . diltiazem 2 % GEL Apply a pea size amount to rectum three times a day 30 g 0  . RABEprazole (ACIPHEX) 20 MG tablet Take 1 tablet (20 mg total) by mouth 2 (two) times daily. 180 tablet 1  . traZODone (DESYREL) 50 MG tablet TAKE 1 TABLET AT BEDTIME 90 tablet 0  . zoledronic acid (RECLAST) 5 MG/100ML SOLN Inject 5 mg into the  vein once.     No current facility-administered medications on file prior to visit.   The PMH, PSH, Social History, Family History, Medications, and allergies have been reviewed in [facility], and have been updated if relevant.   Review of Systems  Constitutional: Positive for appetite change. Negative for unexpected weight change.  Gastrointestinal: Negative for nausea, vomiting, abdominal pain, abdominal distention, anal bleeding and rectal pain.  Psychiatric/Behavioral: The patient is nervous/anxious.   All other systems reviewed and are negative.  See HPI     Objective:   Physical Exam  Constitutional: She is oriented to person, place, and time. She appears well-developed and well-nourished. No distress.  HENT:  Head: Normocephalic.  Eyes: Conjunctivae are normal.  Cardiovascular: Normal rate.   Pulmonary/Chest: Effort normal.  Neurological: She is oriented to person, place, and time.  Skin: Skin is warm and dry.  Psychiatric: She has a normal mood and affect. Her behavior is normal. Judgment and thought content normal.  Nursing note and vitals reviewed.  BP 130/70 mmHg  Pulse 72  Temp(Src) 97.8 F (36.6 C) (Oral)  Wt 126 lb 8 oz (57.38 kg)  SpO2 97%   Wt Readings from Last 3 Encounters:  09/16/14 126 lb 8 oz (57.38 kg)  03/14/14 128 lb (58.06 kg)  03/11/14 128 lb (58.06 kg)          Assessment & Plan:

## 2014-09-16 NOTE — Assessment & Plan Note (Signed)
Deteriorated likely due to acute stress. Advised ok to go back to Aciphex twice daily until symptoms and stressors improve. Call or return to clinic prn if these symptoms worsen or fail to improve as anticipated. The patient indicates understanding of these issues and agrees with the plan.

## 2014-09-16 NOTE — Assessment & Plan Note (Signed)
Deteriorated. >15 minutes spent in face to face time with patient, >50% spent in counselling or coordination of care. Offered by support. No changes in rx today.

## 2014-10-07 ENCOUNTER — Encounter: Payer: Self-pay | Admitting: Family Medicine

## 2014-10-07 MED ORDER — TRAZODONE HCL 50 MG PO TABS: ORAL_TABLET | ORAL | Status: DC

## 2014-11-23 ENCOUNTER — Encounter: Payer: Self-pay | Admitting: Family Medicine

## 2014-11-25 MED ORDER — RABEPRAZOLE SODIUM 20 MG PO TBEC: 20.0000 mg | DELAYED_RELEASE_TABLET | Freq: Two times a day (BID) | ORAL | Status: DC

## 2014-11-27 ENCOUNTER — Encounter: Payer: Self-pay | Admitting: Family Medicine

## 2014-12-02 ENCOUNTER — Encounter: Payer: Self-pay | Admitting: Family Medicine

## 2014-12-13 ENCOUNTER — Encounter: Payer: Self-pay | Admitting: Family Medicine

## 2014-12-13 MED ORDER — NITROFURANTOIN MACROCRYSTAL 50 MG PO CAPS: ORAL_CAPSULE | ORAL | Status: DC

## 2014-12-13 MED ORDER — PROPRANOLOL HCL 10 MG PO TABS: ORAL_TABLET | ORAL | Status: DC

## 2015-01-06 ENCOUNTER — Encounter: Payer: Self-pay | Admitting: Family Medicine

## 2015-01-06 MED ORDER — TRAZODONE HCL 50 MG PO TABS: ORAL_TABLET | ORAL | Status: DC

## 2015-02-11 ENCOUNTER — Encounter: Payer: Self-pay | Admitting: Family Medicine

## 2015-02-12 ENCOUNTER — Other Ambulatory Visit: Payer: Self-pay | Admitting: Family Medicine

## 2015-02-12 DIAGNOSIS — M810 Age-related osteoporosis without current pathological fracture: Secondary | ICD-10-CM

## 2015-02-12 NOTE — Telephone Encounter (Addendum)
Form received to complete for pt to receive Reclast. No Dx of osteoporosis or osteopenia, or any other Dx required, in pts chart to support infusion. Form placed in Dr Hulen Shouts inbox for review and completion. Pt will not be contacted to complete labs until confirmation that she is to receive infusion

## 2015-02-13 ENCOUNTER — Encounter: Payer: Self-pay | Admitting: Family Medicine

## 2015-02-13 DIAGNOSIS — M810 Age-related osteoporosis without current pathological fracture: Secondary | ICD-10-CM

## 2015-02-17 NOTE — Telephone Encounter (Signed)
Lm on pts vm requesting a call back. Form indicates pt must be currently taking more than 1200mg  of Calcium and VitD 800mg . Contacting pt to confirm whether is currently taking.

## 2015-02-20 NOTE — Telephone Encounter (Signed)
Spoke to pt who states that she takes 800mg  of Calcium and 1000mg  of VitD. Pt does not take enough of Calcium supplement to receive reclast infustion. There is no time frame as to how long pt should take additional supplement in order to meet requirements for Reclast.  pls advise

## 2015-02-25 ENCOUNTER — Encounter: Payer: Self-pay | Admitting: Family Medicine

## 2015-03-04 ENCOUNTER — Encounter: Payer: Self-pay | Admitting: Family Medicine

## 2015-03-05 ENCOUNTER — Encounter: Payer: Self-pay | Admitting: Family Medicine

## 2015-03-05 NOTE — Telephone Encounter (Signed)
I sent you something regarding this pt and her Reclast. Would you like for me to just submit her paperwork, although she doesn't meet the requirements on the form, or would you like for me to request prolia for her?

## 2015-03-05 NOTE — Telephone Encounter (Signed)
For some reason, I can see your response. We can have her come for labs and then submit the orders if you'd like, it just may be denied. The alternate is to submit the info to prolia. Whichever you decide, I'll be glad to do. I have her paperwork completed and ready to be sent

## 2015-03-10 ENCOUNTER — Telehealth: Payer: Self-pay | Admitting: Family Medicine

## 2015-03-10 ENCOUNTER — Encounter: Payer: Self-pay | Admitting: Family Medicine

## 2015-03-10 MED ORDER — NITROFURANTOIN MACROCRYSTAL 50 MG PO CAPS: ORAL_CAPSULE | ORAL | Status: DC

## 2015-03-10 MED ORDER — PROPRANOLOL HCL 10 MG PO TABS: ORAL_TABLET | ORAL | Status: DC

## 2015-03-10 NOTE — Telephone Encounter (Signed)
Per your request, I have electronically sent pt's info for [place] verification and will notify you once I have a response. Thank you.

## 2015-03-12 ENCOUNTER — Telehealth: Payer: Self-pay | Admitting: Internal Medicine

## 2015-03-12 NOTE — Telephone Encounter (Signed)
Pt states she is having pain and rectal bleeding again. Pt has h/o anal fissure. Pt will start using the diltiazem again and call us back in a week with an update.

## 2015-03-18 ENCOUNTER — Encounter: Payer: Self-pay | Admitting: Family Medicine

## 2015-03-25 NOTE — Telephone Encounter (Signed)
I have rec'd pt's insurance verification for Prolia.  Patient's estimated responsibility is $0.  Please let her know this is and estimate and we will not know an exact amt until her insurances have paid. If you have any questions, please let me know. Thank you.  I am cc'ing Dee so she will know to order Prolia.

## 2015-03-26 ENCOUNTER — Other Ambulatory Visit (INDEPENDENT_AMBULATORY_CARE_PROVIDER_SITE_OTHER): Payer: Medicare Other

## 2015-03-26 DIAGNOSIS — M810 Age-related osteoporosis without current pathological fracture: Secondary | ICD-10-CM | POA: Diagnosis not present

## 2015-03-26 LAB — CREATININE, SERUM: Creatinine, Ser: 0.94 mg/dL (ref 0.40–1.20)

## 2015-03-26 LAB — CALCIUM: Calcium: 9.6 mg/dL (ref 8.4–10.5)

## 2015-03-26 NOTE — Telephone Encounter (Signed)
Spoke to pt and advised per Prolia. Calcium lab scheduled.

## 2015-04-09 ENCOUNTER — Ambulatory Visit (INDEPENDENT_AMBULATORY_CARE_PROVIDER_SITE_OTHER): Payer: Medicare Other

## 2015-04-09 DIAGNOSIS — M810 Age-related osteoporosis without current pathological fracture: Secondary | ICD-10-CM | POA: Diagnosis not present

## 2015-04-09 MED ORDER — DENOSUMAB 60 MG/ML ~~LOC~~ SOLN
60.0000 mg | Freq: Once | SUBCUTANEOUS | Status: AC
  Administered 2015-04-09: 60 mg via SUBCUTANEOUS

## 2015-05-19 ENCOUNTER — Encounter: Payer: Self-pay | Admitting: Family Medicine

## 2015-05-19 MED ORDER — NITROFURANTOIN MACROCRYSTAL 50 MG PO CAPS: ORAL_CAPSULE | ORAL | Status: DC

## 2015-05-19 MED ORDER — PROPRANOLOL HCL 10 MG PO TABS: ORAL_TABLET | ORAL | Status: DC

## 2015-08-18 ENCOUNTER — Ambulatory Visit (INDEPENDENT_AMBULATORY_CARE_PROVIDER_SITE_OTHER): Payer: Medicare Other | Admitting: Family Medicine

## 2015-08-18 ENCOUNTER — Encounter: Payer: Self-pay | Admitting: Family Medicine

## 2015-08-18 VITALS — BP 108/68 | HR 71 | Temp 97.7°F | Wt 122.2 lb

## 2015-08-18 DIAGNOSIS — Z23 Encounter for immunization: Secondary | ICD-10-CM

## 2015-08-18 DIAGNOSIS — K602 Anal fissure, unspecified: Secondary | ICD-10-CM

## 2015-08-18 DIAGNOSIS — G4700 Insomnia, unspecified: Secondary | ICD-10-CM | POA: Diagnosis not present

## 2015-08-18 DIAGNOSIS — M810 Age-related osteoporosis without current pathological fracture: Secondary | ICD-10-CM | POA: Diagnosis not present

## 2015-08-18 MED ORDER — DILTIAZEM GEL 2 %: CUTANEOUS | Status: DC

## 2015-08-18 MED ORDER — NITROFURANTOIN MACROCRYSTAL 50 MG PO CAPS: ORAL_CAPSULE | ORAL | Status: DC

## 2015-08-18 NOTE — Progress Notes (Signed)
Subjective:    Patient ID: Crystal Gregory, female    DOB: [birth year], 81 y.o.   MRN: [id number]  HPI  Very pleasant 81 yo female here for follow up. Retired Therapist, sports, very pleasant.   GERD- Symptoms had been controlled on Aciphex 20 mg once daily but over past year or two, has needed to take protonix in stead.   No CP or SOB. She does have some seasonal allergies.  Insomnia-  Takes Trazadone 50 mg qhs as needed for insomnia.  Now that her husband is feeling better, she is not having to take this as often.  Anal fissure- Asymptomatic until past 6 months.  Now having more painful rectal bleeding with BMs.  Followed by Dr. Hilarie Fredrickson.    Topical Diltiazem was previously effective.  Osteoporosis- Doing well with Prolia. Next injection due 10/2015. Patient Active Problem List   Diagnosis Date Noted  . Osteoporosis 02/13/2015  . Stress due to illness of family member 09/16/2014  . Rectal pain 01/23/2014  . Abnormal stools 01/23/2014  . Hiatal hernia 10/13/2012  . GERD (gastroesophageal reflux disease)   . Allergy    Past Medical History  Diagnosis Date  . GERD (gastroesophageal reflux disease)   . Allergy   . Septicemia ([place]) 1999    spent 4 weeks in ICU, intubated -? PNA   Past Surgical History  Procedure Laterality Date  . Abdominal hysterectomy     Social History  Substance Use Topics  . Smoking status: Former Research scientist (life sciences)  . Smokeless tobacco: Never Used  . Alcohol Use: Yes     Comment: 2 drinks daily    Family History  Problem Relation Age of Onset  . Parkinson's disease Mother   . Colon polyps Mother 65       . Breast cancer Sister    No Known Allergies Current Outpatient Prescriptions on File Prior to Visit  Medication Sig Dispense Refill  . propranolol (INDERAL) 10 MG tablet TAKE 1 TABLET TWICE A DAY 180 tablet 0  . traZODone (DESYREL) 50 MG tablet TAKE 1 TABLET AT BEDTIME 90 tablet 3   No current facility-administered medications on file prior to visit.   The  PMH, PSH, Social History, Family History, Medications, and allergies have been reviewed in [facility], and have been updated if relevant.   Review of Systems  Constitutional: Negative for appetite change and unexpected weight change.  Gastrointestinal: Positive for anal bleeding and rectal pain. Negative for nausea, vomiting, abdominal pain and abdominal distention.  Psychiatric/Behavioral: Negative for suicidal ideas and sleep disturbance. The patient is not nervous/anxious.   All other systems reviewed and are negative.  See HPI     Objective:   Physical Exam  Constitutional: She is oriented to person, place, and time. She appears well-developed and well-nourished. No distress.  HENT:  Head: Normocephalic.  Eyes: Conjunctivae are normal.  Cardiovascular: Normal rate.   Pulmonary/Chest: Effort normal.  Neurological: She is oriented to person, place, and time.  Skin: Skin is warm and dry.  Psychiatric: She has a normal mood and affect. Her behavior is normal. Judgment and thought content normal.  Nursing note and vitals reviewed.  BP 108/68 mmHg  Pulse 71  Temp(Src) 97.7 F (36.5 C) (Oral)  Wt 122 lb 4 oz (55.452 kg)  SpO2 98%   Wt Readings from Last 3 Encounters:  08/18/15 122 lb 4 oz (55.452 kg)  09/16/14 126 lb 8 oz (57.38 kg)  03/14/14 128 lb (58.06 kg)  Assessment & Plan:

## 2015-08-18 NOTE — Assessment & Plan Note (Signed)
Continue prolia.  

## 2015-08-18 NOTE — Assessment & Plan Note (Signed)
Deteriorated. Refilled diltiazem today. If symptoms do not improve as expected, I did advise that she see Dr. Hilarie Fredrickson again. The patient indicates understanding of these issues and agrees with the plan.

## 2015-08-18 NOTE — Assessment & Plan Note (Addendum)
>  25 minutes spent in face to face time with patient, >50% spent in counselling or coordination of care discussing anal fissures, insomnia, and osteoporosis.  Improved. Continue Trazodone as needed for insomnia.

## 2015-08-20 ENCOUNTER — Encounter: Payer: Self-pay | Admitting: Family Medicine

## 2015-08-20 MED ORDER — PROPRANOLOL HCL 10 MG PO TABS: ORAL_TABLET | ORAL | Status: DC

## 2015-09-16 ENCOUNTER — Telehealth: Payer: Self-pay | Admitting: Family Medicine

## 2015-09-16 NOTE — Telephone Encounter (Signed)
I have electronically submitted pt's info for Prolia insurance verification and will notify you once I have a response. Thank you. °

## 2015-09-17 ENCOUNTER — Ambulatory Visit (INDEPENDENT_AMBULATORY_CARE_PROVIDER_SITE_OTHER): Payer: Medicare Other | Admitting: Family Medicine

## 2015-09-17 ENCOUNTER — Encounter: Payer: Self-pay | Admitting: Family Medicine

## 2015-09-17 VITALS — BP 132/70 | HR 72 | Temp 97.4°F | Wt 122.0 lb

## 2015-09-17 DIAGNOSIS — N9089 Other specified noninflammatory disorders of vulva and perineum: Secondary | ICD-10-CM | POA: Diagnosis not present

## 2015-09-17 DIAGNOSIS — R350 Frequency of micturition: Secondary | ICD-10-CM | POA: Diagnosis not present

## 2015-09-17 DIAGNOSIS — M810 Age-related osteoporosis without current pathological fracture: Secondary | ICD-10-CM

## 2015-09-17 LAB — URINALYSIS, MICROSCOPIC ONLY

## 2015-09-17 MED ORDER — SULFAMETHOXAZOLE-TRIMETHOPRIM 800-160 MG PO TABS: 1.0000 | ORAL_TABLET | Freq: Two times a day (BID) | ORAL | Status: DC

## 2015-09-17 NOTE — Patient Instructions (Signed)
Great to see you. Please stop by to see Rosaria Ferries on your way out.  Leave me a urine sample when you can.  Ask for Rose about Prolia.  Please take Bactrim as directed- 1 tablet twice daily for 7 days.

## 2015-09-17 NOTE — Assessment & Plan Note (Signed)
New with dysuria. Unable to leave a sample in office today. Symptoms consistent with UTI- will treat with bactrim- should also covered epidermal cyst if infected. The patient indicates understanding of these issues and agrees with the plan.

## 2015-09-17 NOTE — Progress Notes (Signed)
Subjective:   Patient ID: Crystal Gregory, female    DOB: [birth year], 81 y.o.   MRN: [id number]  Crystal Gregory is a pleasant 81 y.o. year old female who presents to clinic today with Vaginal Pain and Urinary Frequency  on 09/17/2015  HPI:  Lesions of the vulva- initially one or two yellow colored cystic lesions appear on her labia majora, now she has more and they are feeling tender and swollen.   No fevers. No drainage that she has noticed. Also has been using topical diltiazem on anal fissures so has drainage in her underwear- she assumes from the diltiazem.  Also having some increased urinary frequency with dysuria.  No fevers or back pain.  No n/v/d  She is sexually active with her husband only.  Current Outpatient Prescriptions on File Prior to Visit  Medication Sig Dispense Refill  . Calcium Carbonate-Vit D-Min (CALCIUM 1200 PO) Take by mouth.    . denosumab (PROLIA) 60 MG/ML SOLN injection Inject 60 mg into the skin every 6 (six) months. Administer in upper arm, thigh, or abdomen    . diltiazem 2 % GEL Apply a pea size amount to rectum three times a day 30 g 0  . nitrofurantoin (MACRODANTIN) 50 MG capsule Take one by mouth daily 90 capsule 0  . pantoprazole (PROTONIX) 20 MG tablet Take 20 mg by mouth daily.    . propranolol (INDERAL) 10 MG tablet TAKE 1 TABLET TWICE A DAY 180 tablet 1  . traZODone (DESYREL) 50 MG tablet TAKE 1 TABLET AT BEDTIME 90 tablet 3   No current facility-administered medications on file prior to visit.    No Known Allergies  Past Medical History  Diagnosis Date  . GERD (gastroesophageal reflux disease)   . Allergy   . Septicemia ([place]) 1999    spent 4 weeks in ICU, intubated -? PNA    Past Surgical History  Procedure Laterality Date  . Abdominal hysterectomy      Family History  Problem Relation Age of Onset  . Parkinson's disease Mother   . Colon polyps Mother 74       . Breast cancer Sister     Social History   Social  History  . Marital Status: Married    Spouse Name: N/A  . Number of Children: 2  . Years of Education: N/A   Occupational History  . Retired     Social History Main Topics  . Smoking status: Former Research scientist (life sciences)  . Smokeless tobacco: Never Used  . Alcohol Use: Yes     Comment: 2 drinks daily   . Drug Use: No  . Sexual Activity: Not on file   Other Topics Concern  . Not on file   Social History Narrative   Married.     2 children.  Lives with her husband in [place].   The PMH, PSH, Social History, Family History, Medications, and allergies have been reviewed in [facility], and have been updated if relevant.   Review of Systems  Constitutional: Negative.   Genitourinary: Positive for urgency, frequency, vaginal pain and pelvic pain. Negative for dysuria, hematuria, flank pain, vaginal bleeding, vaginal discharge, enuresis, difficulty urinating and dyspareunia.  Skin: Positive for rash.  Neurological: Negative.   Hematological: Negative.   Psychiatric/Behavioral: Negative.   All other systems reviewed and are negative.      Objective:    BP 132/70 mmHg  Pulse 72  Temp(Src) 97.4 F (36.3 C) (Oral)  Wt 122 lb (55.339 kg)  SpO2  97%   Physical Exam  Constitutional: She is oriented to person, place, and time. She appears well-developed and well-nourished. No distress.  HENT:  Head: Normocephalic.  Eyes: Conjunctivae are normal.  Cardiovascular: Normal rate.   Pulmonary/Chest: Effort normal.  Genitourinary:     Musculoskeletal: Normal range of motion.  Neurological: She is alert and oriented to person, place, and time. No cranial nerve deficit.  Skin: Skin is warm and dry. She is not diaphoretic.  Psychiatric: She has a normal mood and affect. Her behavior is normal. Judgment and thought content normal.  Nursing note and vitals reviewed.         Assessment & Plan:   Urinary frequency - Plan: Urinalysis Dipstick  Vulval lesion No Follow-up on file.

## 2015-09-17 NOTE — Telephone Encounter (Signed)
Lm with pts husband requesting a call back. Prolia is available, and calcium lab has been ordered

## 2015-09-17 NOTE — Addendum Note (Signed)
Addended by: Modena Nunnery on: 09/17/2015 10:49 AM   Modules accepted: Orders

## 2015-09-17 NOTE — Assessment & Plan Note (Signed)
New- ? Epidermal cysts, do not appear currently infected or inflamed. She has a GYN- will refer to GYN for further evaluation- ? Excision. The patient indicates understanding of these issues and agrees with the plan.

## 2015-09-17 NOTE — Progress Notes (Signed)
Pre visit review using our clinic review tool, if applicable. No additional management support is needed unless otherwise documented below in the visit note. 

## 2015-09-17 NOTE — Addendum Note (Signed)
Addended by: Daralene Milch C on: 09/17/2015 02:04 PM   Modules accepted: Orders, SmartSet

## 2015-09-17 NOTE — Telephone Encounter (Signed)
I have rec'd Ms. [employer] insurance verification for Prolia and she has an estimated responsibility of $0.  Please make her aware this is an estimate and we will not know an exact amt until insurances have paid.  I have sent a copy of the summary of benefits to be scanned into pt's chart.  If you have any questions, please let me know.  Also, please notify me of the actual injection date once she rec's it so I can update the Prolia portal.  Thank you.  Cc: Dee (for ordering purposes)

## 2015-09-19 LAB — URINE CULTURE: Colony Count: >=100000

## 2015-09-21 ENCOUNTER — Encounter: Payer: Self-pay | Admitting: Family Medicine

## 2015-09-22 ENCOUNTER — Other Ambulatory Visit: Payer: Self-pay | Admitting: Family Medicine

## 2015-09-22 ENCOUNTER — Encounter: Payer: Self-pay | Admitting: Family Medicine

## 2015-09-22 MED ORDER — CIPROFLOXACIN HCL 250 MG PO TABS: 250.0000 mg | ORAL_TABLET | Freq: Two times a day (BID) | ORAL | Status: DC

## 2015-10-02 ENCOUNTER — Other Ambulatory Visit: Payer: Medicare Other

## 2015-10-07 ENCOUNTER — Ambulatory Visit: Payer: Medicare Other

## 2015-10-08 ENCOUNTER — Encounter: Payer: Self-pay | Admitting: Internal Medicine

## 2015-10-08 ENCOUNTER — Ambulatory Visit (INDEPENDENT_AMBULATORY_CARE_PROVIDER_SITE_OTHER): Payer: Medicare Other | Admitting: Internal Medicine

## 2015-10-08 VITALS — BP 122/64 | HR 73 | Temp 97.8°F | Wt 122.0 lb

## 2015-10-08 DIAGNOSIS — K5900 Constipation, unspecified: Secondary | ICD-10-CM

## 2015-10-08 DIAGNOSIS — K602 Anal fissure, unspecified: Secondary | ICD-10-CM | POA: Diagnosis not present

## 2015-10-08 DIAGNOSIS — R140 Abdominal distension (gaseous): Secondary | ICD-10-CM | POA: Diagnosis not present

## 2015-10-08 MED ORDER — OXYCODONE-ACETAMINOPHEN 5-325 MG PO TABS: 1.0000 | ORAL_TABLET | ORAL | Status: DC | PRN

## 2015-10-08 NOTE — Progress Notes (Signed)
Subjective:    Patient ID: Crystal Gregory, female    DOB: Jun 01, 1932, 82 y.o.   MRN: [id number]  HPI  Pt presents to the clinic today with c/o rectal pain. This started 3 months ago. She reports she has an anal fissure and it has been bleeding and causing her pain. She has been applying the Diltiazem gel, but had to stop 2 weeks ago because it was so painful. She has not noticed any bleeding from her rectum recently. Her main issue now is bloating and constipation. She reports she has not had a BM in 4 days. She is passing gas. She does feel like she has been drinking plenty of water. She has also been taking a stool softener OTC. She has seen GI in the past for the same. They advised her there was nothing they could do other than the Diltiazem gel. Flex Sig from 03/2014 reviewed.  Review of Systems      Past Medical History  Diagnosis Date  . GERD (gastroesophageal reflux disease)   . Allergy   . Septicemia ([place]) 1999    spent 4 weeks in ICU, intubated -? PNA    Current Outpatient Prescriptions  Medication Sig Dispense Refill  . Calcium Carbonate-Vit D-Min (CALCIUM 1200 PO) Take by mouth.    . denosumab (PROLIA) 60 MG/ML SOLN injection Inject 60 mg into the skin every 6 (six) months. Administer in upper arm, thigh, or abdomen    . nitrofurantoin (MACRODANTIN) 50 MG capsule Take one by mouth daily 90 capsule 0  . pantoprazole (PROTONIX) 20 MG tablet Take 20 mg by mouth daily.    . propranolol (INDERAL) 10 MG tablet TAKE 1 TABLET TWICE A DAY 180 tablet 1  . traZODone (DESYREL) 50 MG tablet TAKE 1 TABLET AT BEDTIME 90 tablet 3  . diltiazem 2 % GEL Apply a pea size amount to rectum three times a day (Patient not taking: Reported on 10/08/2015) 30 g 0   No current facility-administered medications for this visit.    No Known Allergies  Family History  Problem Relation Age of Onset  . Parkinson's disease Mother   . Colon polyps Mother 15       . Breast cancer Sister     Social  History   Social History  . Marital Status: Married    Spouse Name: N/A  . Number of Children: 2  . Years of Education: N/A   Occupational History  . Retired     Social History Main Topics  . Smoking status: Former Research scientist (life sciences)  . Smokeless tobacco: Never Used  . Alcohol Use: Yes     Comment: 2 drinks daily   . Drug Use: No  . Sexual Activity: Not on file   Other Topics Concern  . Not on file   Social History Narrative   Married.     2 children.  Lives with her husband in [place].     Constitutional: Denies fever, malaise, fatigue, headache or abrupt weight changes.  Respiratory: Denies difficulty breathing, shortness of breath, cough or sputum production.   Cardiovascular: Denies chest pain, chest tightness, palpitations or swelling in the hands or feet.  Gastrointestinal: Pt reports bloating and constipation. Denies abdominal pain, diarrhea.   No other specific complaints in a complete review of systems (except as listed in HPI above).  Objective:   Physical Exam   BP 122/64 mmHg  Pulse 73  Temp(Src) 97.8 F (36.6 C) (Oral)  Wt 122 lb (55.339 kg)  SpO2 97% Wt Readings from Last 3 Encounters:  10/08/15 122 lb (55.339 kg)  09/17/15 122 lb (55.339 kg)  08/18/15 122 lb 4 oz (55.452 kg)    General: Appears her stated age, well developed, well nourished in NAD. Abdomen: Soft and nontender. Hypoactive bowel sounds. No distention or masses noted. She has a large abdominal incision noted. Rectal: External hemorrhoids noted. Normal rectal tone. Soft stool noted in rectal vault. Severe pain with palpation of the superior rectal wall (12 oclock).  BMET    Component Value Date/Time   NA 137 12/06/2012 1123   K 4.1 12/06/2012 1123   CL 101 12/06/2012 1123   CO2 26 12/06/2012 1123   GLUCOSE 127* 12/06/2012 1123   BUN 15 12/06/2012 1123   CREATININE 0.94 03/26/2015 1129   CALCIUM 9.6 03/26/2015 1129    Lipid Panel  No results found for: CHOL, TRIG, HDL, CHOLHDL,  VLDL, LDLCALC  CBC No results found for: WBC, RBC, HGB, HCT, PLT, MCV, MCH, MCHC, RDW, LYMPHSABS, MONOABS, EOSABS, BASOSABS  Hgb A1C No results found for: HGBA1C      Assessment & Plan:   Bloating and Constipation:  She is already consuming a high fiber diet Encouraged her to drink plenty of water Continue Colace She should try a Fleets enema today, if no results may repeat x 1 Let me know if you do not have a BM in the next 24 hours  Anal fissure:  Stool heme negative Advised her to use Diltiazem gel if she has increasing rectal pain or bright red blood from her rectum  RTC as needed or if symptoms persist or worsen

## 2015-10-08 NOTE — Progress Notes (Signed)
Pre visit review using our clinic review tool, if applicable. No additional management support is needed unless otherwise documented below in the visit note. 

## 2015-10-08 NOTE — Patient Instructions (Signed)
Anal Fissure, Adult °An anal fissure is a small tear or crack in the skin around the anus. Bleeding from a fissure usually stops on its own within a few minutes. However, bleeding will often occur again with each bowel movement until the crack heals. °CAUSES °This condition may be caused by: °· Passing large, hard stool (feces). °· Frequent diarrhea. °· Constipation. °· Inflammatory bowel disease (Crohn disease or ulcerative colitis). °· Infections. °· Anal sex. °SYMPTOMS °Symptoms of this condition include: °· Bleeding from the rectum. °· Small amounts of blood seen on your stool, on toilet paper, or in the toilet after a bowel movement. °· Painful bowel movements. °· Itching or irritation around the anus. °DIAGNOSIS  °A health care provider may diagnose this condition by closely examining the anal area. An anal fissure can usually be seen with careful inspection. In some cases, a rectal exam may be performed, or a short tube (anoscope) may be used to examine the anal canal. °TREATMENT °Treatment for this condition may include: °· Taking steps to avoid constipation. This may include making changes to your diet, such as increasing your intake of fiber or fluid. °· Taking fiber supplements. These supplements can soften your stool to help make bowel movements easier. Your health care provider may also prescribe a stool softener if your stool is often hard. °· Taking sitz baths. This may help to heal the tear. °· Using medicated creams or ointments. These may be prescribed to lessen discomfort. °HOME CARE INSTRUCTIONS °Eating and Drinking °· Avoid foods that may be constipating, such as bananas and dairy products. °· Drink enough fluid to keep your urine clear or pale yellow. °· Maintain a diet that is high in fruits, whole grains, and vegetables. °General Instructions °· Keep the anal area as clean and dry as possible. °· Take sitz baths as told by your health care provider. Do not use soap in the sitz baths. °· Take  over-the-counter and prescription medicines only as told by your health care provider. °· Use creams or ointments only as told by your health care provider. °· Keep all follow-up visits as told by your health care provider. This is important. °SEEK MEDICAL CARE IF: °· You have more bleeding. °· You have a fever. °· You have diarrhea that is mixed with blood. °· You continue to have pain. °· Your problem is getting worse rather than better. °  °This information is not intended to replace advice given to you by your health care provider. Make sure you discuss any questions you have with your health care provider. °  °Document Released: 09/20/2005 Document Revised: 06/11/2015 Document Reviewed: 12/16/2014 °Elsevier Interactive Patient Education ©2016 [employer] Inc[employer] ° °

## 2015-10-09 ENCOUNTER — Telehealth: Payer: Self-pay | Admitting: Family Medicine

## 2015-10-09 NOTE — Telephone Encounter (Signed)
Pt called to let you know she is doing much better

## 2015-10-09 NOTE — Telephone Encounter (Signed)
noted 

## 2015-10-13 ENCOUNTER — Other Ambulatory Visit (INDEPENDENT_AMBULATORY_CARE_PROVIDER_SITE_OTHER): Payer: Medicare Other

## 2015-10-13 ENCOUNTER — Other Ambulatory Visit: Payer: Medicare Other

## 2015-10-13 DIAGNOSIS — M810 Age-related osteoporosis without current pathological fracture: Secondary | ICD-10-CM

## 2015-10-13 LAB — CALCIUM: Calcium: 9.8 mg/dL (ref 8.4–10.5)

## 2015-10-16 ENCOUNTER — Ambulatory Visit (INDEPENDENT_AMBULATORY_CARE_PROVIDER_SITE_OTHER): Payer: Medicare Other

## 2015-10-16 DIAGNOSIS — M810 Age-related osteoporosis without current pathological fracture: Secondary | ICD-10-CM | POA: Diagnosis not present

## 2015-10-16 MED ORDER — DENOSUMAB 60 MG/ML ~~LOC~~ SOLN
60.0000 mg | Freq: Once | SUBCUTANEOUS | Status: AC
  Administered 2015-10-16: 60 mg via SUBCUTANEOUS

## 2015-10-21 ENCOUNTER — Encounter: Payer: Self-pay | Admitting: Family Medicine

## 2015-10-21 MED ORDER — TRAZODONE HCL 50 MG PO TABS: ORAL_TABLET | ORAL | Status: DC

## 2015-10-27 MED ORDER — TRAZODONE HCL 50 MG PO TABS: ORAL_TABLET | ORAL | Status: DC

## 2015-10-27 NOTE — Addendum Note (Signed)
Addended by: Modena Nunnery on: 10/27/2015 02:17 PM   Modules accepted: Orders

## 2015-11-17 ENCOUNTER — Encounter: Payer: Self-pay | Admitting: Family Medicine

## 2015-11-17 MED ORDER — NITROFURANTOIN MACROCRYSTAL 50 MG PO CAPS: ORAL_CAPSULE | ORAL | Status: DC

## 2015-11-17 NOTE — Telephone Encounter (Signed)
Received refill request from patient Last refill 08/18/15 #90 Last office visit 10/08/15/acute Is it okay to refill?

## 2015-11-27 DIAGNOSIS — N762 Acute vulvitis: Secondary | ICD-10-CM | POA: Diagnosis not present

## 2016-02-04 ENCOUNTER — Encounter: Payer: Self-pay | Admitting: Family Medicine

## 2016-02-04 MED ORDER — PROPRANOLOL HCL 10 MG PO TABS: ORAL_TABLET | ORAL | Status: DC

## 2016-02-04 MED ORDER — PANTOPRAZOLE SODIUM 20 MG PO TBEC: 20.0000 mg | DELAYED_RELEASE_TABLET | Freq: Every day | ORAL | Status: DC

## 2016-02-13 ENCOUNTER — Encounter: Payer: Self-pay | Admitting: Family Medicine

## 2016-02-13 MED ORDER — NITROFURANTOIN MACROCRYSTAL 50 MG PO CAPS: ORAL_CAPSULE | ORAL | Status: DC

## 2016-02-13 NOTE — Telephone Encounter (Signed)
Per Dr Deborra Medina, "Ok to refill rx as requested and to help her with setting up prolia injection." Rx sent to requested pharmacy

## 2016-02-13 NOTE — Telephone Encounter (Signed)
Can you please process this pts paperwork for prolia. She is due in June

## 2016-02-24 ENCOUNTER — Telehealth: Payer: Self-pay | Admitting: Family Medicine

## 2016-02-24 NOTE — Telephone Encounter (Signed)
I have electronically submitted patient's info for [place] verification and will notify you once I have a response. Thank you.

## 2016-03-01 ENCOUNTER — Encounter: Payer: Self-pay | Admitting: Family Medicine

## 2016-03-05 NOTE — Telephone Encounter (Signed)
Lm on pts vm requesting a call back 

## 2016-03-05 NOTE — Telephone Encounter (Signed)
Spoke to pt and advised. Calcium lab scheduled.  

## 2016-03-05 NOTE — Telephone Encounter (Signed)
I have rec'd Ms. [employer] insurance verification for Prolia and she has an estimated responsibility of $0. Please make pt aware this is an estimate and we will not know an exact amt until insurance(s) has/have paid.  I have sent a copy of the summary of benefits to be scanned into pt's chart.    Once pt recs injection, please let me know actual injection date so I can update the Prolia portal.  If you have any questions, please let me know.

## 2016-03-11 ENCOUNTER — Other Ambulatory Visit (INDEPENDENT_AMBULATORY_CARE_PROVIDER_SITE_OTHER): Payer: Medicare Other

## 2016-03-11 DIAGNOSIS — M810 Age-related osteoporosis without current pathological fracture: Secondary | ICD-10-CM

## 2016-03-11 LAB — CALCIUM: CALCIUM: 9.9 mg/dL (ref 8.4–10.5)

## 2016-03-22 ENCOUNTER — Encounter: Payer: Self-pay | Admitting: Family Medicine

## 2016-03-22 ENCOUNTER — Ambulatory Visit (INDEPENDENT_AMBULATORY_CARE_PROVIDER_SITE_OTHER)
Admission: RE | Admit: 2016-03-22 | Discharge: 2016-03-22 | Disposition: A | Discharge status: Home / Self Care | Payer: Medicare Other | Source: Ambulatory Visit | Attending: Family Medicine | Admitting: Family Medicine

## 2016-03-22 ENCOUNTER — Ambulatory Visit
Admission: RE | Admit: 2016-03-22 | Discharge: 2016-03-22 | Disposition: A | Discharge status: Home / Self Care | Payer: Medicare Other | Source: Ambulatory Visit | Attending: Family Medicine | Admitting: Family Medicine

## 2016-03-22 ENCOUNTER — Other Ambulatory Visit: Payer: Self-pay | Admitting: Family Medicine

## 2016-03-22 ENCOUNTER — Ambulatory Visit (INDEPENDENT_AMBULATORY_CARE_PROVIDER_SITE_OTHER): Payer: Medicare Other | Admitting: Family Medicine

## 2016-03-22 VITALS — BP 108/62 | HR 79 | Temp 97.8°F | Wt 116.8 lb

## 2016-03-22 DIAGNOSIS — R05 Cough: Secondary | ICD-10-CM

## 2016-03-22 DIAGNOSIS — J3089 Other allergic rhinitis: Secondary | ICD-10-CM | POA: Diagnosis not present

## 2016-03-22 DIAGNOSIS — R059 Cough, unspecified: Secondary | ICD-10-CM

## 2016-03-22 DIAGNOSIS — J470 Bronchiectasis with acute lower respiratory infection: Secondary | ICD-10-CM

## 2016-03-22 DIAGNOSIS — R490 Dysphonia: Secondary | ICD-10-CM

## 2016-03-22 DIAGNOSIS — J479 Bronchiectasis, uncomplicated: Secondary | ICD-10-CM | POA: Diagnosis not present

## 2016-03-22 DIAGNOSIS — R918 Other nonspecific abnormal finding of lung field: Secondary | ICD-10-CM | POA: Insufficient documentation

## 2016-03-22 DIAGNOSIS — J309 Allergic rhinitis, unspecified: Secondary | ICD-10-CM | POA: Insufficient documentation

## 2016-03-22 MED ORDER — MONTELUKAST SODIUM 10 MG PO TABS: 10.0000 mg | ORAL_TABLET | Freq: Every day | ORAL | Status: DC

## 2016-03-22 MED ORDER — DOXYCYCLINE HYCLATE 100 MG PO TABS: 100.0000 mg | ORAL_TABLET | Freq: Two times a day (BID) | ORAL | Status: DC

## 2016-03-22 MED ORDER — ALBUTEROL SULFATE HFA 108 (90 BASE) MCG/ACT IN AERS: 2.0000 | INHALATION_SPRAY | Freq: Four times a day (QID) | RESPIRATORY_TRACT | Status: DC | PRN

## 2016-03-22 MED ORDER — DEXAMETHASONE SODIUM PHOSPHATE 10 MG/ML IJ SOLN
10.0000 mg | Freq: Once | INTRAMUSCULAR | Status: AC
  Administered 2016-03-22: 10 mg via INTRAMUSCULAR

## 2016-03-22 NOTE — Assessment & Plan Note (Signed)
Persistent. IM decadron given in office. Start singulair as this could be a component of PND from allergic rhinitis. If no improvement, refer to ENT for laryngoscope. The patient indicates understanding of these issues and agrees with the plan.

## 2016-03-22 NOTE — Patient Instructions (Addendum)
Great to see you. Please start taking singulair daily with proair inhaler as needed for cough/shortness of breath.  Please update me later this week.  I will call you with your xray results.

## 2016-03-22 NOTE — Progress Notes (Signed)
Subjective:   Patient ID: Crystal Gregory, female    DOB: 1931-11-01, 82 y.o.   MRN: [id number]  Crystal Gregory is a pleasant 82 y.o. year old female who presents to clinic today with Allergies  on 03/22/2016  HPI:  She has noticed that she cannot seem to recover from a URI she had several weeks ago.  Not sure if it's her allergies.  Symptoms seem worse when she is around strong scents or freshly cut grass.  Has been hoarse for weeks.  Feels like she is working to breath when she talks.  Has had 3 episodes when she became very short of breath and spit up copious amounts of phelgm.  Does not have a rescue inhaler.  Not sure if she has been wheezing.  No difficulty swallowing.  Current Outpatient Prescriptions on File Prior to Visit  Medication Sig Dispense Refill  . Calcium Carbonate-Vit D-Min (CALCIUM 1200 PO) Take by mouth.    . denosumab (PROLIA) 60 MG/ML SOLN injection Inject 60 mg into the skin every 6 (six) months. Administer in upper arm, thigh, or abdomen    . diltiazem 2 % GEL Apply a pea size amount to rectum three times a day 30 g 0  . nitrofurantoin (MACRODANTIN) 50 MG capsule Take one by mouth daily 90 capsule 0  . pantoprazole (PROTONIX) 20 MG tablet Take 1 tablet (20 mg total) by mouth daily. 90 tablet 1  . propranolol (INDERAL) 10 MG tablet TAKE 1 TABLET TWICE A DAY 180 tablet 1  . traZODone (DESYREL) 50 MG tablet TAKE 1 TABLET AT BEDTIME 90 tablet 1   No current facility-administered medications on file prior to visit.    No Known Allergies  Past Medical History  Diagnosis Date  . GERD (gastroesophageal reflux disease)   . Allergy   . Septicemia ([place]) 1999    spent 4 weeks in ICU, intubated -? PNA    Past Surgical History  Procedure Laterality Date  . Abdominal hysterectomy      Family History  Problem Relation Age of Onset  . Parkinson's disease Mother   . Colon polyps Mother 76       . Breast cancer Sister     Social History   Social History    . Marital Status: Married    Spouse Name: N/A  . Number of Children: 2  . Years of Education: N/A   Occupational History  . Retired     Social History Main Topics  . Smoking status: Former Research scientist (life sciences)  . Smokeless tobacco: Never Used  . Alcohol Use: Yes     Comment: 2 drinks daily   . Drug Use: No  . Sexual Activity: Not on file   Other Topics Concern  . Not on file   Social History Narrative   Married.     2 children.  Lives with her husband in [place].   The PMH, PSH, Social History, Family History, Medications, and allergies have been reviewed in [facility], and have been updated if relevant.   Review of Systems  Constitutional: Negative for fever and fatigue.  HENT: Positive for postnasal drip.   Eyes: Negative.   Respiratory: Positive for cough and shortness of breath. Negative for wheezing and stridor.   Cardiovascular: Negative.   Gastrointestinal: Negative.   Genitourinary: Negative.   Musculoskeletal: Negative.   Skin: Negative.   Allergic/Immunologic: Negative.   Hematological: Negative.   Psychiatric/Behavioral: Negative.   All other systems reviewed and are negative.  Objective:    BP 108/62 mmHg  Pulse 79  Temp(Src) 97.8 F (36.6 C) (Oral)  Wt 116 lb 12 oz (52.957 kg)  SpO2 98%   Physical Exam  Constitutional: She is oriented to person, place, and time. She appears well-developed and well-nourished. No distress.  HENT:  Head: Normocephalic.  Eyes: Conjunctivae are normal.  Neck:  Sounds hoarse when talking  Cardiovascular: Normal rate.   Pulmonary/Chest: Accessory muscle usage present. She has no decreased breath sounds. She has no wheezes. She has no rhonchi. She has no rales.  Neurological: She is alert and oriented to person, place, and time. No cranial nerve deficit.  Skin: Skin is warm and dry. She is not diaphoretic.  Psychiatric: She has a normal mood and affect. Her behavior is normal. Judgment and thought content normal.  Nursing  note and vitals reviewed.         Assessment & Plan:   Other allergic rhinitis - Plan: DG Chest 2 View  Hoarseness - Plan: DG Chest 2 View  Cough - Plan: DG Chest 2 View No Follow-up on file.

## 2016-03-22 NOTE — Assessment & Plan Note (Signed)
Persistent with accessory muscle use. CXR today. O2 sats and lung exam reassuring. proair inhaler given to use prn. May need further pulm work up pending results of CXR and response to singulair/steroids. The patient indicates understanding of these issues and agrees with the plan.

## 2016-03-23 ENCOUNTER — Ambulatory Visit (INDEPENDENT_AMBULATORY_CARE_PROVIDER_SITE_OTHER): Payer: Medicare Other | Admitting: Internal Medicine

## 2016-03-23 ENCOUNTER — Encounter: Payer: Self-pay | Admitting: Internal Medicine

## 2016-03-23 ENCOUNTER — Ambulatory Visit: Payer: Medicare Other | Admitting: Internal Medicine

## 2016-03-23 VITALS — BP 122/60 | HR 75 | Ht 68.0 in | Wt 117.0 lb

## 2016-03-23 DIAGNOSIS — R490 Dysphonia: Secondary | ICD-10-CM

## 2016-03-23 DIAGNOSIS — J22 Unspecified acute lower respiratory infection: Secondary | ICD-10-CM | POA: Insufficient documentation

## 2016-03-23 DIAGNOSIS — J8410 Pulmonary fibrosis, unspecified: Secondary | ICD-10-CM | POA: Diagnosis not present

## 2016-03-23 DIAGNOSIS — J470 Bronchiectasis with acute lower respiratory infection: Secondary | ICD-10-CM | POA: Diagnosis not present

## 2016-03-23 DIAGNOSIS — R059 Cough, unspecified: Secondary | ICD-10-CM

## 2016-03-23 DIAGNOSIS — R05 Cough: Secondary | ICD-10-CM

## 2016-03-23 DIAGNOSIS — J988 Other specified respiratory disorders: Secondary | ICD-10-CM

## 2016-03-23 DIAGNOSIS — J479 Bronchiectasis, uncomplicated: Secondary | ICD-10-CM | POA: Insufficient documentation

## 2016-03-23 DIAGNOSIS — J84112 Idiopathic pulmonary fibrosis: Secondary | ICD-10-CM | POA: Insufficient documentation

## 2016-03-23 MED ORDER — PREDNISONE 20 MG PO TABS: 20.0000 mg | ORAL_TABLET | Freq: Every day | ORAL | Status: DC

## 2016-03-23 MED ORDER — LEVOFLOXACIN 500 MG PO TABS: 500.0000 mg | ORAL_TABLET | Freq: Every day | ORAL | Status: DC

## 2016-03-23 MED ORDER — FLUTICASONE FUROATE 100 MCG/ACT IN AEPB: 1.0000 | INHALATION_SPRAY | Freq: Every day | RESPIRATORY_TRACT | Status: AC

## 2016-03-23 MED ORDER — FLUTTER DEVI: Status: DC

## 2016-03-23 NOTE — Progress Notes (Signed)
[facility] Consultation    Date: 03/23/2016  MRN# [id number] Taleesa Fluegel 30-Jun-1932  Referring Physician: Dr. Deborra Medina PMD - Dr. Brigitte Pulse Crystal Gregory is a 82 y.o. old female seen in consultation for abnormal CT scan, cough, hoarse voice.  CC:  Chief Complaint  Patient presents with  . pulmonary consult    pt ref by Dr. Deborra Medina. c/o non prod cough, sob, wheezing, voice hoarseness, fatigued, wt [id number] currently taking doxy 100mg  bid for infection. CT & CXR yesterday.    HPI:  Patient is a pleasant 82 year old female past medical history of acid reflux disease, seasonal allergies, bacteremia/pneumonia requiring 4 weeks of intubation in 1999, seen in consultation for abnormal CAT scan along with cough and hoarseness for the past 3 months. Patient states about 3 months ago she started having a hoarse voice with cough, cough was initially nonproductive and then became productive. About 3-4 weeks ago she had upper respiratory tract infection and since then the cough is becoming more productive with thick white sputum. Patient could not identify any significant etiology for beginning of cough 3 months ago, other than going to the beach. She has no sick contacts, she has allergies to pollen, oak trees, ragweed. Further history from patient reveals that in 1999 she had a severe pneumonia that subsequently developed bacteremia/septicemia, requiring intubation for about 4 weeks in [place]. Review of CT scan showed that she may have had an empyema on the right at that time that was subsequently drained. He has a past smoking history of half pack per day 30 years, quit in 1982. Results of CAT scan discussed with patient and her husband today. There is some pulmonary fibrosis on the left lower lobe along with bronchiectasis and pulmonary scarring in the bilateral lower lobes. Patient denies any hemoptysis, night sweats, fevers, significant weight loss. She has a history of childhood  rickets and developed pectus excavatum, mild since then. She has no significant travel history outside the country in the past year. She has no stomach and exotic pets at home, parakeets, etc. She states that she's traveling to [place] in the next 1-2 days, with her husband. Level of symptoms the patient was having, she had a chest x-ray followed by subsequent CT as stated below, and she was started on doxycycline by her primary care physician. She has had one day of doxycycline thus far.  PMHX:   Past Medical History  Diagnosis Date  . GERD (gastroesophageal reflux disease)   . Allergy   . Septicemia ([place]) 1999    spent 4 weeks in ICU, intubated -? PNA   Surgical Hx:  Past Surgical History  Procedure Laterality Date  . Abdominal hysterectomy     Family Hx:  Family History  Problem Relation Age of Onset  . Parkinson's disease Mother   . Colon polyps Mother 63       . Breast cancer Sister    Social Hx:   Social History  Substance Use Topics  . Smoking status: Former Smoker -- 0.50 packs/day for 30 years    Quit date: 01/16/1981  . Smokeless tobacco: Never Used  . Alcohol Use: Yes     Comment: 2 drinks daily    Medication:   Current Outpatient Rx  Name  Route  Sig  Dispense  Refill  . albuterol (PROVENTIL HFA;VENTOLIN HFA) 108 (90 Base) MCG/ACT inhaler   Inhalation   Inhale 2 puffs into the lungs every 6 (six) hours as needed.   1 Inhaler  0   . Calcium Carbonate-Vit D-Min (CALCIUM 1200 PO)   Oral   Take by mouth.         . denosumab (PROLIA) 60 MG/ML SOLN injection   Subcutaneous   Inject 60 mg into the skin every 6 (six) months. Administer in upper arm, thigh, or abdomen         . diltiazem 2 % GEL      Apply a pea size amount to rectum three times a day Patient taking differently: as needed. Apply a pea size amount to rectum three times a day   30 g   0   . doxycycline (VIBRA-TABS) 100 MG tablet   Oral   Take 1 tablet (100 mg total) by mouth 2  (two) times daily.   20 tablet   0   . montelukast (SINGULAIR) 10 MG tablet   Oral   Take 1 tablet (10 mg total) by mouth at bedtime.   30 tablet   3   . nitrofurantoin (MACRODANTIN) 50 MG capsule      Take one by mouth daily   90 capsule   0   . pantoprazole (PROTONIX) 20 MG tablet   Oral   Take 1 tablet (20 mg total) by mouth daily.   90 tablet   1   . propranolol (INDERAL) 10 MG tablet      TAKE 1 TABLET TWICE A DAY   180 tablet   1   . traZODone (DESYREL) 50 MG tablet      TAKE 1 TABLET AT BEDTIME   90 tablet   1   . Fluticasone Furoate (ARNUITY ELLIPTA) 100 MCG/ACT AEPB   Inhalation   Inhale 1 puff into the lungs daily.   14 each   0   . levofloxacin (LEVAQUIN) 500 MG tablet   Oral   Take 1 tablet (500 mg total) by mouth daily.   10 tablet   0     1 TAB FOR 10 DAYS   . predniSONE (DELTASONE) 20 MG tablet   Oral   Take 1 tablet (20 mg total) by mouth daily.   5 tablet   0     1 TAB FOR 5 DAYS   . Respiratory Therapy Supplies (FLUTTER) DEVI      Use as directed   1 each   0       Allergies:  Review of patient's allergies indicates no known allergies.  Review of Systems  Constitutional: Negative for fever and chills.  HENT: Positive for sore throat.   Eyes: Negative for blurred vision.  Respiratory: Positive for cough, sputum production and wheezing. Negative for hemoptysis, shortness of breath and stridor.   Gastrointestinal: Negative for heartburn and nausea.  Genitourinary: Negative for dysuria.  Musculoskeletal: Negative for myalgias.  Neurological: Negative for dizziness.  Endo/Heme/Allergies: Does not bruise/bleed easily.  Psychiatric/Behavioral: Negative for depression.     Physical Examination:   VS: BP 122/60 mmHg  Pulse 75  Ht 5\' 8"  (1.727 m)  Wt 117 lb (53.071 kg)  BMI 17.79 kg/m2  SpO2 99%  General Appearance: No distress  Neuro:without focal findings, mental status, speech normal, alert and oriented, cranial  nerves 2-12 intact, reflexes normal and symmetric, sensation grossly normal  HEENT: PERRLA, EOM intact, no ptosis, no other lesions noticed; Mallampati 2 Pulmonary: Good upper airway sounds, good respiratory effort, good diaphragmatic excursion, fine dry basilar crackles left greater than right. CardiovascularNormal S1,S2.  No m/r/g.  Abdominal aorta pulsation normal.  Abdomen: Benign, Soft, non-tender, No masses, hepatosplenomegaly, No lymphadenopathy Renal:  No costovertebral tenderness  GU:  No performed at this time. Endoc: No evident thyromegaly, no signs of acromegaly or Cushing features Skin:   warm, no rashes, no ecchymosis  Extremities: normal, no cyanosis, clubbing, no edema, warm with normal capillary refill. Other findings: None   Labs results:   Rad results: (The following images and results were reviewed by Dr. Stevenson Clinch on 03/23/2016). CT Chest 03/22/16 CLINICAL DATA: Bronchiectasis. Worsening shortness of breath. Cough.  EXAM: CT CHEST WITHOUT CONTRAST  TECHNIQUE: Multidetector CT imaging of the chest was performed following the standard protocol without IV contrast.  COMPARISON: Chest radiography same day  FINDINGS: Lungs are hyperinflated in general, but do not show focal bullous emphysema. There is extensive bronchiectasis throughout with bronchial wall thickening. At the lung bases, the bronchiectasis is most severe and there is some destroyed lung parenchyma as well with areas of pulmonary fibrosis. There certainly could be inflammation in these regions of involvement, but there is no consolidation or lobar collapse. Despite the extensive lung disease, there is no evidence of a focal nodule or mass that would require follow-up based on the imaging appearance. There is no pleural or pericardial fluid. No hilar or mediastinal adenopathy seen on this noncontrast study. There is atherosclerosis of the aorta and the coronary arteries. There is curvature of  the spine convex to the right but no fracture. Scans in the upper abdomen do not show any significant finding.  IMPRESSION: Generalized pulmonary hyperinflation but without bullous emphysema.  Widespread bronchiectasis with bronchial wall thickening consistent with inflammatory bronchitis. Areas of pulmonary fibrosis, most pronounced in the lower lobes, where the bronchiectasis is more extensive, including areas of pulmonary lung destruction. No regional consolidation or lobar collapse.  These results will be called to the ordering clinician or representative by the Radiologist Assistant, and communication documented in the PACS or zVision Dashboard.    Assessment and Plan: 82 year old female seen in consultation for chronic cough along with abnormal CAT scan. Bronchiectasis ([place]) Bronchiectasis: Infectious most likely  She has prominent bronchiectasis and bilateral lower lobes right greater than left. I suspect that after her significant pneumonia in 1999 she developed right lower lobe scarring subsequent pulmonary fibrosis and bronchiectasis that has been fairly well under control until her recent respiratory tract infection. I suspect she had a upper story tract infection that developed into a lower respiratory tract infection, further exacerbating her bronchiectasis. It is on sure to tell how long she's had bronchiectasis for, but based on the current CAT scan this seems more of a flare and then acute onset. She does have scarring in the upper lobes consistent with her prior smoking history along with scarring, primary fibrosis in the bilateral lower lobes.  Plan: -Flutter valve -Prednisone 20 mg 5 days, Levaquin 500 milligrams 10 days -Sputum culture/respiratory culture -Avoid sick contacts/allergens -Spirometry in office prior to follow-up visit -Trial of inhaled corticosteroid -Discussed with patient if symptoms are not improving may need to consider  bronchoscopy.  Lower respiratory tract infection Show significant bronchiectasis and infiltrates in the bilateral lower lobes.  Plan: -Levaquin 500 milligrams 10 days -Prednisone 20 mg 5 days -Follow-up -Pulmonary hygiene -In office spirometry prior to follow-up visit -repeat CT without contrast in 2-3 months.  Pulmonary fibrosis ([place]) Give him pulmonary fibrosis in the left lower lobe. Symptoms prior to recent wrist story tract infection were mild. There is some honeycombing by my review, given the mild symptoms prior to  recent infection and bronchiectasis flare, surveillance CT and continue lifestyle modification is recommended at this time.  Plan: -Treat underlying infection/bronchiectasis flare -Repeat CT in 2-3 months. Without contrast  Cough Ultra factorial: Primary fibrosis, bronchiectasis, bronchiectasis flare, lower story tract infection, postinfectious.  We'll continue to treat bronchiectasis, and monitor patient  Hoarseness Most likely postinfectious cough causing inflammation. Continue treatment with prednisone and Levaquin.    Updated Medication List Outpatient Encounter Prescriptions as of 03/23/2016  Medication Sig  . albuterol (PROVENTIL HFA;VENTOLIN HFA) 108 (90 Base) MCG/ACT inhaler Inhale 2 puffs into the lungs every 6 (six) hours as needed.  . Calcium Carbonate-Vit D-Min (CALCIUM 1200 PO) Take by mouth.  . denosumab (PROLIA) 60 MG/ML SOLN injection Inject 60 mg into the skin every 6 (six) months. Administer in upper arm, thigh, or abdomen  . diltiazem 2 % GEL Apply a pea size amount to rectum three times a day (Patient taking differently: as needed. Apply a pea size amount to rectum three times a day)  . doxycycline (VIBRA-TABS) 100 MG tablet Take 1 tablet (100 mg total) by mouth 2 (two) times daily.  . montelukast (SINGULAIR) 10 MG tablet Take 1 tablet (10 mg total) by mouth at bedtime.  . nitrofurantoin (MACRODANTIN) 50 MG capsule Take one by mouth  daily  . pantoprazole (PROTONIX) 20 MG tablet Take 1 tablet (20 mg total) by mouth daily.  . propranolol (INDERAL) 10 MG tablet TAKE 1 TABLET TWICE A DAY  . traZODone (DESYREL) 50 MG tablet TAKE 1 TABLET AT BEDTIME  . Fluticasone Furoate (ARNUITY ELLIPTA) 100 MCG/ACT AEPB Inhale 1 puff into the lungs daily.  Marland Kitchen levofloxacin (LEVAQUIN) 500 MG tablet Take 1 tablet (500 mg total) by mouth daily.  . predniSONE (DELTASONE) 20 MG tablet Take 1 tablet (20 mg total) by mouth daily.  Marland Kitchen Respiratory Therapy Supplies (FLUTTER) DEVI Use as directed   No facility-administered encounter medications on file as of 03/23/2016.    Orders for this visit: Orders Placed This Encounter  Procedures  . Sputum culture  . CT Chest Wo Contrast    Standing Status: Future     Number of Occurrences:      Standing Expiration Date: 05/23/2017    Order Specific Question:  Reason for Exam (SYMPTOM  OR DIAGNOSIS REQUIRED)    Answer:  PULMONARY FIBROSIS    Order Specific Question:  Preferred imaging location?    Answer:  [facility]     Thank  you for the consultation and for allowing [facility] to assist in the care of your patient. Our recommendations are noted above.  Please contact us if we can be of further service.   Vilinda Boehringer, MD [facility] Office Number: [phone]  Note: This note was prepared with Dragon dictation along with smaller phrase technology. Any transcriptional errors that result from this process are unintentional.

## 2016-03-23 NOTE — Assessment & Plan Note (Signed)
Give him pulmonary fibrosis in the left lower lobe. Symptoms prior to recent wrist story tract infection were mild. There is some honeycombing by my review, given the mild symptoms prior to recent infection and bronchiectasis flare, surveillance CT and continue lifestyle modification is recommended at this time.  Plan: -Treat underlying infection/bronchiectasis flare -Repeat CT in 2-3 months. Without contrast

## 2016-03-23 NOTE — Assessment & Plan Note (Signed)
Ultra factorial: Primary fibrosis, bronchiectasis, bronchiectasis flare, lower story tract infection, postinfectious.  We'll continue to treat bronchiectasis, and monitor patient

## 2016-03-23 NOTE — Assessment & Plan Note (Signed)
Most likely postinfectious cough causing inflammation. Continue treatment with prednisone and Levaquin.

## 2016-03-23 NOTE — Progress Notes (Signed)
Patient ID: Crystal Gregory, female   DOB: Mar 02, 1932, 82 y.o.   MRN: [id number]  Patient seen in the office today and instructed on use of arnuity ellipta.  Patient expressed understanding and demonstrated technique.

## 2016-03-23 NOTE — Patient Instructions (Addendum)
Follow up with Dr. Stevenson Clinch in:3 months - stop doxcycline - Levaquin 500 - 1 tab po x 10 days - Prednisone 20mg  - 1 tab daily with breakfast x 5 days.  - flutter valve 10-15 times per day - trial of Arnuity 175mcg for 2 weeks - 1 puff daily. -gargle and rinse after each use. If improving, then call our office back and we will give you a prescription.  - avoid allergens - repeat CT chest without contrast in 3 months for pulmonary fibrosis and bronchiectasis.  - in office spirometry prior to follow up visit.  - we care currently treating and working you up for: bronchiectasis, lower respiratory tract infection and pulmonary fibrosis.  - morning sputum sample/respiratory culture prior to travel.

## 2016-03-23 NOTE — Assessment & Plan Note (Signed)
Bronchiectasis: Infectious most likely  She has prominent bronchiectasis and bilateral lower lobes right greater than left. I suspect that after her significant pneumonia in 1999 she developed right lower lobe scarring subsequent pulmonary fibrosis and bronchiectasis that has been fairly well under control until her recent respiratory tract infection. I suspect she had a upper story tract infection that developed into a lower respiratory tract infection, further exacerbating her bronchiectasis. It is on sure to tell how long she's had bronchiectasis for, but based on the current CAT scan this seems more of a flare and then acute onset. She does have scarring in the upper lobes consistent with her prior smoking history along with scarring, primary fibrosis in the bilateral lower lobes.  Plan: -Flutter valve -Prednisone 20 mg 5 days, Levaquin 500 milligrams 10 days -Sputum culture/respiratory culture -Avoid sick contacts/allergens -Spirometry in office prior to follow-up visit -Trial of inhaled corticosteroid -Discussed with patient if symptoms are not improving may need to consider bronchoscopy.

## 2016-03-23 NOTE — Assessment & Plan Note (Signed)
Show significant bronchiectasis and infiltrates in the bilateral lower lobes.  Plan: -Levaquin 500 milligrams 10 days -Prednisone 20 mg 5 days -Follow-up -Pulmonary hygiene -In office spirometry prior to follow-up visit -repeat CT without contrast in 2-3 months.

## 2016-03-24 ENCOUNTER — Telehealth: Payer: Self-pay

## 2016-03-24 ENCOUNTER — Other Ambulatory Visit
Admission: RE | Admit: 2016-03-24 | Discharge: 2016-03-24 | Disposition: A | Discharge status: Home / Self Care | Payer: Medicare Other | Source: Ambulatory Visit | Attending: Internal Medicine | Admitting: Internal Medicine

## 2016-03-24 DIAGNOSIS — J470 Bronchiectasis with acute lower respiratory infection: Secondary | ICD-10-CM | POA: Diagnosis not present

## 2016-03-24 DIAGNOSIS — R05 Cough: Secondary | ICD-10-CM | POA: Diagnosis not present

## 2016-03-24 DIAGNOSIS — J8410 Pulmonary fibrosis, unspecified: Secondary | ICD-10-CM | POA: Insufficient documentation

## 2016-03-24 NOTE — Telephone Encounter (Signed)
Received call from lab on sputum culture that was ordered on 03/23/16 OV with VW. The culture was unaccepted by the lab. Left message on pt voicemail to come by the lab when she returns from vacation to try and get a better culture  Will await call back

## 2016-03-29 NOTE — Telephone Encounter (Signed)
Returned pt call and she states she no longer has a prod cough so she will be unable to give a sputum culture Pt was advised if cough does return to give [place] a call. Nothing further needed

## 2016-03-29 NOTE — Telephone Encounter (Signed)
Patient returning call from [place].

## 2016-04-09 LAB — EXPECTORATED SPUTUM ASSESSMENT W REFEX TO RESP CULTURE

## 2016-04-09 LAB — EXPECTORATED SPUTUM ASSESSMENT W GRAM STAIN, RFLX TO RESP C

## 2016-04-12 ENCOUNTER — Encounter: Payer: Self-pay | Admitting: Family Medicine

## 2016-04-14 ENCOUNTER — Telehealth: Payer: Self-pay | Admitting: Family Medicine

## 2016-04-14 DIAGNOSIS — M810 Age-related osteoporosis without current pathological fracture: Secondary | ICD-10-CM

## 2016-04-14 NOTE — Telephone Encounter (Signed)
Pt is requesting a calcium lab so that she can get her prolia  cb number [phone]

## 2016-04-14 NOTE — Telephone Encounter (Signed)
Order placed

## 2016-04-15 NOTE — Telephone Encounter (Signed)
Patient scheduled appointment for lab on 04/16/16.

## 2016-04-16 ENCOUNTER — Other Ambulatory Visit (INDEPENDENT_AMBULATORY_CARE_PROVIDER_SITE_OTHER): Payer: Medicare Other

## 2016-04-16 DIAGNOSIS — M810 Age-related osteoporosis without current pathological fracture: Secondary | ICD-10-CM

## 2016-04-16 LAB — CALCIUM: Calcium: 9.7 mg/dL (ref 8.4–10.5)

## 2016-04-20 ENCOUNTER — Ambulatory Visit (INDEPENDENT_AMBULATORY_CARE_PROVIDER_SITE_OTHER): Payer: Medicare Other

## 2016-04-20 DIAGNOSIS — M810 Age-related osteoporosis without current pathological fracture: Secondary | ICD-10-CM | POA: Diagnosis not present

## 2016-04-20 MED ORDER — DENOSUMAB 60 MG/ML ~~LOC~~ SOLN
60.0000 mg | Freq: Once | SUBCUTANEOUS | Status: AC
Start: 2016-04-20 — End: 2016-04-20
  Administered 2016-04-20: 60 mg via SUBCUTANEOUS

## 2016-04-28 ENCOUNTER — Encounter: Payer: Self-pay | Admitting: Family Medicine

## 2016-04-28 ENCOUNTER — Ambulatory Visit (INDEPENDENT_AMBULATORY_CARE_PROVIDER_SITE_OTHER): Payer: Medicare Other | Admitting: Family Medicine

## 2016-04-28 VITALS — BP 116/62 | HR 69 | Temp 97.5°F | Wt 115.8 lb

## 2016-04-28 DIAGNOSIS — J470 Bronchiectasis with acute lower respiratory infection: Secondary | ICD-10-CM

## 2016-04-28 DIAGNOSIS — J8410 Pulmonary fibrosis, unspecified: Secondary | ICD-10-CM

## 2016-04-28 MED ORDER — LEVOFLOXACIN 500 MG PO TABS: 500.0000 mg | ORAL_TABLET | Freq: Every day | ORAL | 0 refills | Status: DC

## 2016-04-28 MED ORDER — NITROFURANTOIN MACROCRYSTAL 50 MG PO CAPS: ORAL_CAPSULE | ORAL | 0 refills | Status: DC

## 2016-04-28 NOTE — Progress Notes (Signed)
Subjective:   Patient ID: Crystal Gregory, female    DOB: 01/27/1932, 82 y.o.   MRN: [id number]  Crystal Gregory is a pleasant 82 y.o. year old female who presents to clinic today with Follow-up  on 04/28/2016  HPI:  Bronchiectasis/pulmonary fibrosis- recent diagnosis.  Now followed by pulmonary. Last seen by Dr. Stevenson Clinch on 03/23/16.  Note reviewed. Started on flutter valve, prednisone and levaquin for acute infection.  She just wanted to be seen today to discuss how she has been doing.  Overall she feels better.  Appetite has been down.  Has lost weight and her clothing is loose.  She is still short of breath but less so.  Also not as hoarse and not coughing as much.  Wt Readings from Last 3 Encounters:  04/28/16 115 lb 12 oz (52.5 kg)  03/23/16 117 lb (53.1 kg)  03/22/16 116 lb 12 oz (53 kg)   Current Outpatient Prescriptions on File Prior to Visit  Medication Sig Dispense Refill  . albuterol (PROVENTIL HFA;VENTOLIN HFA) 108 (90 Base) MCG/ACT inhaler Inhale 2 puffs into the lungs every 6 (six) hours as needed. 1 Inhaler 0  . Calcium Carbonate-Vit D-Min (CALCIUM 1200 PO) Take by mouth.    . denosumab (PROLIA) 60 MG/ML SOLN injection Inject 60 mg into the skin every 6 (six) months. Administer in upper arm, thigh, or abdomen    . pantoprazole (PROTONIX) 20 MG tablet Take 1 tablet (20 mg total) by mouth daily. 90 tablet 1  . propranolol (INDERAL) 10 MG tablet TAKE 1 TABLET TWICE A DAY 180 tablet 1  . traZODone (DESYREL) 50 MG tablet TAKE 1 TABLET AT BEDTIME 90 tablet 1   No current facility-administered medications on file prior to visit.     No Known Allergies  Past Medical History:  Diagnosis Date  . Allergy   . GERD (gastroesophageal reflux disease)   . Septicemia ([place]) 1999   spent 4 weeks in ICU, intubated -? PNA    Past Surgical History:  Procedure Laterality Date  . ABDOMINAL HYSTERECTOMY      Family History  Problem Relation Age of Onset  . Parkinson's disease  Mother   . Colon polyps Mother 71       . Breast cancer Sister     Social History   Social History  . Marital status: Married    Spouse name: N/A  . Number of children: 2  . Years of education: N/A   Occupational History  . Retired     Social History Main Topics  . Smoking status: Former Smoker    Packs/day: 0.50    Years: 30.00    Quit date: 01/16/1981  . Smokeless tobacco: Never Used  . Alcohol use Yes     Comment: 2 drinks daily   . Drug use: No  . Sexual activity: Not on file   Other Topics Concern  . Not on file   Social History Narrative   Married.     2 children.  Lives with her husband in [place].   The PMH, PSH, Social History, Family History, Medications, and allergies have been reviewed in [facility], and have been updated if relevant.   Review of Systems  Constitutional: Positive for appetite change and unexpected weight change. Negative for fever.  HENT: Negative for trouble swallowing and voice change.   Respiratory: Positive for cough and shortness of breath. Negative for wheezing and stridor.   Cardiovascular: Negative.   All other systems reviewed and are negative.  Objective:    BP 116/62   Pulse 69   Temp 97.5 F (36.4 C) (Oral)   Wt 115 lb 12 oz (52.5 kg)   SpO2 99%   BMI 17.60 kg/m    Physical Exam  Constitutional: She is oriented to person, place, and time.  Thin, color is better today.  HENT:  Head: Normocephalic.  Eyes: Conjunctivae are normal.  Cardiovascular: Normal rate and regular rhythm.   Pulmonary/Chest: Effort normal. She has no wheezes.  Musculoskeletal: Normal range of motion.  Neurological: She is alert and oriented to person, place, and time. No cranial nerve deficit.  Skin: Skin is warm and dry.  Psychiatric: She has a normal mood and affect. Her behavior is normal. Judgment and thought content normal.  Nursing note and vitals reviewed.         Assessment & Plan:   No diagnosis found. No Follow-up on  file.

## 2016-04-28 NOTE — Assessment & Plan Note (Signed)
>  25 minutes spent in face to face time with patient, >50% spent in counselling or coordination of care Overall, she is feeling better but I am concerned about her decreased appetite and weight loss.  I have suggested that she add nutritional shakes like Ensure or Boost daily to her diet. The patient indicates understanding of these issues and agrees with the plan.

## 2016-04-28 NOTE — Progress Notes (Signed)
Pre visit review using our clinic review tool, if applicable. No additional management support is needed unless otherwise documented below in the visit note. 

## 2016-06-11 ENCOUNTER — Ambulatory Visit
Admission: RE | Admit: 2016-06-11 | Discharge: 2016-06-11 | Disposition: A | Discharge status: Home / Self Care | Payer: Medicare Other | Source: Ambulatory Visit | Attending: Internal Medicine | Admitting: Internal Medicine

## 2016-06-11 ENCOUNTER — Other Ambulatory Visit: Payer: Self-pay | Admitting: Internal Medicine

## 2016-06-11 DIAGNOSIS — J8410 Pulmonary fibrosis, unspecified: Secondary | ICD-10-CM | POA: Insufficient documentation

## 2016-06-11 DIAGNOSIS — I2510 Atherosclerotic heart disease of native coronary artery without angina pectoris: Secondary | ICD-10-CM | POA: Diagnosis not present

## 2016-06-11 DIAGNOSIS — J470 Bronchiectasis with acute lower respiratory infection: Secondary | ICD-10-CM | POA: Diagnosis not present

## 2016-06-11 DIAGNOSIS — J479 Bronchiectasis, uncomplicated: Secondary | ICD-10-CM | POA: Diagnosis not present

## 2016-06-11 DIAGNOSIS — I700 Atherosclerosis of aorta: Secondary | ICD-10-CM | POA: Insufficient documentation

## 2016-06-14 ENCOUNTER — Ambulatory Visit: Payer: Medicare Other | Admitting: Internal Medicine

## 2016-06-14 ENCOUNTER — Encounter: Payer: Self-pay | Admitting: Family Medicine

## 2016-06-14 MED ORDER — NITROFURANTOIN MACROCRYSTAL 50 MG PO CAPS: ORAL_CAPSULE | ORAL | 0 refills | Status: DC

## 2016-06-14 NOTE — Telephone Encounter (Signed)
Pt requesting refill of abx. pls advise 

## 2016-06-16 ENCOUNTER — Ambulatory Visit: Payer: Medicare Other

## 2016-06-18 ENCOUNTER — Ambulatory Visit (INDEPENDENT_AMBULATORY_CARE_PROVIDER_SITE_OTHER): Payer: Medicare Other | Admitting: Internal Medicine

## 2016-06-18 ENCOUNTER — Telehealth: Payer: Self-pay

## 2016-06-18 ENCOUNTER — Ambulatory Visit: Payer: Medicare Other | Admitting: Internal Medicine

## 2016-06-18 ENCOUNTER — Encounter: Payer: Self-pay | Admitting: Internal Medicine

## 2016-06-18 VITALS — BP 124/62 | HR 83 | Ht 68.0 in | Wt 117.0 lb

## 2016-06-18 DIAGNOSIS — J470 Bronchiectasis with acute lower respiratory infection: Secondary | ICD-10-CM | POA: Diagnosis not present

## 2016-06-18 DIAGNOSIS — J849 Interstitial pulmonary disease, unspecified: Secondary | ICD-10-CM | POA: Insufficient documentation

## 2016-06-18 DIAGNOSIS — J988 Other specified respiratory disorders: Secondary | ICD-10-CM | POA: Diagnosis not present

## 2016-06-18 DIAGNOSIS — R05 Cough: Secondary | ICD-10-CM | POA: Diagnosis not present

## 2016-06-18 DIAGNOSIS — J8410 Pulmonary fibrosis, unspecified: Secondary | ICD-10-CM

## 2016-06-18 DIAGNOSIS — J22 Unspecified acute lower respiratory infection: Secondary | ICD-10-CM

## 2016-06-18 DIAGNOSIS — R059 Cough, unspecified: Secondary | ICD-10-CM

## 2016-06-18 MED ORDER — FLUTICASONE FUROATE-VILANTEROL 100-25 MCG/INH IN AEPB: 1.0000 | INHALATION_SPRAY | Freq: Every day | RESPIRATORY_TRACT | 0 refills | Status: AC

## 2016-06-18 NOTE — Telephone Encounter (Signed)
Spoke with pt who is aware of need for labs, pt [place] [facility] [place] [facility] to have these labs. Pt voiced understanding. Nothing further needed.

## 2016-06-18 NOTE — Progress Notes (Signed)
[facility] Consultation      MRN# [id number] Crystal Gregory [birth year]   CC: Chief Complaint  Patient presents with  . Follow-up    38morov. pt had CT 06-11-16. pt states breating has worsen, pt c/o increased sob w/exertion, prod cough w/yellow mucus, hoarseness, chest heaviness & hair loss      Brief History: 82yo seen in [place] for Cough, had PNA in 1999 (sepsis and intubated), also with bronchiectasis and pulm fib.    Events since last clinic visit: Mild worsening of SOB Moderate nasal congestion Has some cough with mild thick, whitish/greenish/yellowish, sputum No fevers, night sweat     Current Outpatient Prescriptions:  .  albuterol (PROVENTIL HFA;VENTOLIN HFA) 108 (90 Base) MCG/ACT inhaler, Inhale 2 puffs into the lungs every 6 (six) hours as needed., Disp: 1 Inhaler, Rfl: 0 .  Calcium Carbonate-Vit D-Min (CALCIUM 1200 PO), Take by mouth., Disp: , Rfl:  .  denosumab (PROLIA) 60 MG/ML SOLN injection, Inject 60 mg into the skin every 6 (six) months. Administer in upper arm, thigh, or abdomen, Disp: , Rfl:  .  nitrofurantoin (MACRODANTIN) 50 MG capsule, Take one by mouth daily, Disp: 90 capsule, Rfl: 0 .  pantoprazole (PROTONIX) 20 MG tablet, Take 1 tablet (20 mg total) by mouth daily., Disp: 90 tablet, Rfl: 1 .  propranolol (INDERAL) 10 MG tablet, TAKE 1 TABLET TWICE A DAY, Disp: 180 tablet, Rfl: 1 .  traZODone (DESYREL) 50 MG tablet, TAKE 1 TABLET AT BEDTIME, Disp: 90 tablet, Rfl: 1   Review of Systems  Constitutional: Negative for chills and fever.  HENT: Positive for congestion.   Respiratory: Positive for cough, sputum production and shortness of breath.   Cardiovascular: Negative for chest pain and palpitations.  Gastrointestinal: Negative for heartburn.  Genitourinary: Negative for dysuria.  Endo/Heme/Allergies: Does not bruise/bleed easily.      Allergies:  Review of patient's allergies indicates no known allergies.  Physical  Examination:  VS: BP 124/62 (BP Location: Left Arm, Cuff Size: Normal)   Pulse 83   Ht _0  (1.727 m)   Wt 117 lb (53.1 kg)   SpO2 95%   BMI 17.79 kg/m   General Appearance: No distress  HEENT: PERRLA, no ptosis, no other lesions noticed Pulmonary:normal breath sounds., diaphragmatic excursion normal.No wheezing, No rales   Cardiovascular:  Normal S1,S2.  No m/r/g.     Abdomen:Exam: Benign, Soft, non-tender, No masses  Skin:   warm, no rashes, no ecchymosis  Extremities: normal, no cyanosis, clubbing, warm with normal capillary refill.      Rad results: (The following images and results were reviewed by Dr. MStevenson Clinchon 06/18/2016). CT Chest 06/11/16 TECHNIQUE: Multidetector CT imaging of the chest was performed following the standard protocol without intravenous contrast. High resolution imaging of the lungs, as well as inspiratory and expiratory imaging, was performed.  COMPARISON:  03/22/2016 chest CT.  FINDINGS: Cardiovascular: Normal heart size. No significant pericardial fluid/thickening. Left anterior descending and right coronary atherosclerosis. Atherosclerotic nonaneurysmal thoracic aorta. Normal caliber pulmonary arteries.  Mediastinum/Nodes: No discrete thyroid nodules. Unremarkable esophagus. No pathologically enlarged axillary, mediastinal or gross hilar lymph nodes, noting limited sensitivity for the detection of hilar adenopathy on this noncontrast study.  Lungs/Pleura: No pneumothorax. No pleural effusion. There is diffuse cylindrical and varicoid bronchiectasis throughout both lungs, most severe at the lung bases. There is patchy confluent subpleural reticulation throughout both lungs with a clear basilar gradient. There is patchy honeycombing at the lung bases. These findings have  progressed mildly in the short interval. No acute consolidative airspace disease, lung masses or significant pulmonary nodules. No evidence of significant air trapping on the  limited expiration sequence.  Upper abdomen: Unremarkable.  Musculoskeletal: No aggressive appearing focal osseous lesions. Mild thoracic spondylosis.  IMPRESSION: 1. Diffuse cylindrical and varicoid bronchiectasis throughout both lungs, most severe at the lung bases. Basilar predominant fibrotic interstitial lung disease with patchy honeycombing, with mild progression in the short interval since 03/22/2016, worrisome for usual interstitial pneumonia (UIP). A follow-up high-resolution chest CT is advised in 6-12 months to assess temporal pattern stability. 2. Aortic atherosclerosis.  Two-vessel coronary atherosclerosis.      Assessment and Plan:82 yo female with chronic cough, now with imaging of bronchiectasis/pulm fibrosis/honeycombing, seen for follow up visit.  ILD (interstitial lung disease) ([place]) CT with progression of pulm fib along with honeycombing.  Suspect underlyling ILD in addition to pulm fibrosis.  She also has moderate to extensive bronchiectasis especially at the lung bases.   Plan - bronchoscopy for BAL and TBBx  - incentive spirometry, flutter vavle - cont with supportive care - follow up with Dr. Chase Caller.   As part of ILD workup will check the following labs: autoimmune panel: Serum: ESR, ACE, ANA, DS-DNA, RF, anti-CCP, ssA, ssB, scl-70, ANCA screen, MPO and PR-3 antibodies, Total CK, Aldolase Hypersensitivity Pneumonitis Panel  Bronchiectasis ([place]) Noted more in the bases. Chronic bronchiectasis, but noted to be worsening over the past several months, along with clinical aspect of cough and suspected recurrent URI.  Has already completed multiple courses of antibiotics an steroids  Plan - bronchoscopy with BAL and TBBx  - pulmonary hygiene  - incentive spirometry and flutter valve - further recs after bronchoscopy.   Lower respiratory tract infection Plan for bronchoscopy with BAL and TBBx Pulmonary hygiene  Pulmonary fibrosis  (HCC) Chronic, but getting slightly worst over time.   Plan - autoimmune workup - follow with ILD specialist, Dr. Chase Caller - Bronch with BAL and TBBx.     Cough Multifactorial - pulm fibrosis, ILD, bronchiectasis  Spirometry - FEV1 63%, FEV1/FVC 58% - moderate obstruction.   Plan - pulmonary hygiene - breo trial - bronchoscopy with BAL and TBBx   Updated Medication List Outpatient Encounter Prescriptions as of 06/18/2016  Medication Sig  . albuterol (PROVENTIL HFA;VENTOLIN HFA) 108 (90 Base) MCG/ACT inhaler Inhale 2 puffs into the lungs every 6 (six) hours as needed.  . Calcium Carbonate-Vit D-Min (CALCIUM 1200 PO) Take by mouth.  . denosumab (PROLIA) 60 MG/ML SOLN injection Inject 60 mg into the skin every 6 (six) months. Administer in upper arm, thigh, or abdomen  . nitrofurantoin (MACRODANTIN) 50 MG capsule Take one by mouth daily  . pantoprazole (PROTONIX) 20 MG tablet Take 1 tablet (20 mg total) by mouth daily.  . propranolol (INDERAL) 10 MG tablet TAKE 1 TABLET TWICE A DAY  . traZODone (DESYREL) 50 MG tablet TAKE 1 TABLET AT BEDTIME  . [DISCONTINUED] levofloxacin (LEVAQUIN) 500 MG tablet Take 1 tablet (500 mg total) by mouth daily.   No facility-administered encounter medications on file as of 06/18/2016.     Orders for this visit: Orders Placed This Encounter  Procedures  . Ganglioside GQ1B autoantibodies    Standing Status:   Future    Number of Occurrences:   1    Standing Expiration Date:   06/18/2017  . Sed Rate (ESR)    Standing Status:   Future    Number of Occurrences:   1  Standing Expiration Date:   06/18/2017  . Angiotensin converting enzyme    Standing Status:   Future    Number of Occurrences:   1    Standing Expiration Date:   06/18/2017  . ANA    Standing Status:   Future    Number of Occurrences:   1    Standing Expiration Date:   06/18/2017  . Anti-DNA antibody, double-stranded    Standing Status:   Future    Number of Occurrences:   1     Standing Expiration Date:   06/18/2017  . Cyclic citrul peptide antibody, IgG    Standing Status:   Future    Number of Occurrences:   1    Standing Expiration Date:   06/18/2017  . Sjogren's syndrome antibods(ssa + ssb)    Standing Status:   Future    Number of Occurrences:   1    Standing Expiration Date:   06/18/2017  . Anti-Scleroderma Antibody    Standing Status:   Future    Number of Occurrences:   1    Standing Expiration Date:   06/18/2017  . ANCA TITERS    Standing Status:   Future    Number of Occurrences:   1    Standing Expiration Date:   06/18/2017  . Antimyeloperoxidase (MPO) Abs    Standing Status:   Future    Number of Occurrences:   1    Standing Expiration Date:   06/18/2017  . Mpo/pr-3 (anca) antibodies    Standing Status:   Future    Number of Occurrences:   1    Standing Expiration Date:   06/18/2017  . CK Total (and CKMB)    Standing Status:   Future    Number of Occurrences:   1    Standing Expiration Date:   06/18/2017  . Aldolase    Standing Status:   Future    Number of Occurrences:   1    Standing Expiration Date:   06/18/2017  . Rheumatoid Factor    Standing Status:   Future    Number of Occurrences:   1    Standing Expiration Date:   06/18/2017  . Hypersensitivity Pneumonitis    Standing Status:   Future    Number of Occurrences:   1    Standing Expiration Date:   06/18/2017  . Spirometry with Graph    Order Specific Question:   Where should this test be performed?    Answer:   [facility]    Thank  you for the visitation and for allowing  [facility] to assist in the care of your patient. Our recommendations are noted above.  Please contact us if we can be of further service.  Vilinda Boehringer, MD [facility] Office Number: [phone]  Note: This note was prepared with Dragon dictation along with smaller phrase technology. Any transcriptional errors that result from this process are  unintentional.

## 2016-06-18 NOTE — Progress Notes (Signed)
Patient ID: Crystal Gregory, female   DOB: 12-29-1931, 82 y.o.   MRN: [id number] Patient seen in the office today and instructed on use of Breo Ellipta.  Patient expressed understanding and demonstrated technique.

## 2016-06-18 NOTE — Patient Instructions (Addendum)
Follow up with Dr. Stevenson Clinch in:6 weeks - we will setup a bronchoscopy for recurrent lung infections and bronchiectasis in the next 2-3 weeks.  Bronch with BAL and transbronchial biopsies (fluoroscopy) - cont with rescue inhaler - okay to use Mucinex as directed - BreoEllipta - trial - 100/25, 1 puff daily, for one month. -gargle and rinse after each use.  - we will setup an initial evaluation with our ILD specialist, Dr. Chase Caller, in 6-8 weeks.   As part of ILD workup will check the following labs: autoimmune panel: Serum: ESR, ACE, ANA, DS-DNA, RF, anti-CCP, ssA, ssB, scl-70, ANCA screen, MPO and PR-3 antibodies, Total CK, Aldolase Hypersensitivity Pneumonitis Panel

## 2016-06-21 ENCOUNTER — Other Ambulatory Visit
Admission: RE | Admit: 2016-06-21 | Discharge: 2016-06-21 | Disposition: A | Discharge status: Home / Self Care | Payer: Medicare Other | Source: Ambulatory Visit | Attending: Internal Medicine | Admitting: Internal Medicine

## 2016-06-21 DIAGNOSIS — J849 Interstitial pulmonary disease, unspecified: Secondary | ICD-10-CM

## 2016-06-21 LAB — SEDIMENTATION RATE: SED RATE: 112 mm/h — AB (ref 0–30)

## 2016-06-21 LAB — CK TOTAL AND CKMB (NOT AT ARMC)
CK TOTAL: 218 U/L (ref 38–234)
CK, MB: 3.3 ng/mL (ref 0.5–5.0)
Relative Index: 1.5 (ref 0.0–2.5)

## 2016-06-22 LAB — ANTI-SCLERODERMA ANTIBODY

## 2016-06-22 LAB — MPO/PR-3 (ANCA) ANTIBODIES: ANCA Proteinase 3: <3.5 U/mL (ref 0.0–3.5)

## 2016-06-22 LAB — MISC LABCORP TEST (SEND OUT): LABCORP TEST CODE: 12708

## 2016-06-22 LAB — ANTI-DNA ANTIBODY, DOUBLE-STRANDED: DS DNA AB: 7 [IU]/mL (ref 0–9)

## 2016-06-22 LAB — RHEUMATOID FACTOR: Rhuematoid fact SerPl-aCnc: 14.8 IU/mL — ABNORMAL HIGH (ref 0.0–13.9)

## 2016-06-22 LAB — ALDOLASE: Aldolase: 1.9 U/L — ABNORMAL LOW (ref 3.3–10.3)

## 2016-06-22 LAB — ANGIOTENSIN CONVERTING ENZYME: Angiotensin-Converting Enzyme: 29 U/L (ref 14–82)

## 2016-06-23 LAB — ANCA TITERS
Atypical P-ANCA titer: <1:20 {titer}
C-ANCA: <1:20 {titer}
P-ANCA: <1:20 {titer}

## 2016-06-23 NOTE — Assessment & Plan Note (Signed)
Plan for bronchoscopy with BAL and TBBx Pulmonary hygiene

## 2016-06-23 NOTE — Assessment & Plan Note (Signed)
Noted more in the bases. Chronic bronchiectasis, but noted to be worsening over the past several months, along with clinical aspect of cough and suspected recurrent URI.  Has already completed multiple courses of antibiotics an steroids  Plan - bronchoscopy with BAL and TBBx  - pulmonary hygiene  - incentive spirometry and flutter valve - further recs after bronchoscopy.

## 2016-06-23 NOTE — Assessment & Plan Note (Signed)
Chronic, but getting slightly worst over time.   Plan - autoimmune workup - follow with ILD specialist, Dr. Chase Caller - Bronch with BAL and TBBx.

## 2016-06-23 NOTE — Assessment & Plan Note (Addendum)
CT with progression of pulm fib along with honeycombing.  Suspect underlyling ILD in addition to pulm fibrosis.  She also has moderate to extensive bronchiectasis especially at the lung bases.   Plan - bronchoscopy for BAL and TBBx  - incentive spirometry, flutter vavle - cont with supportive care - follow up with Dr. Chase Caller.   As part of ILD workup will check the following labs: autoimmune panel: Serum: ESR, ACE, ANA, DS-DNA, RF, anti-CCP, ssA, ssB, scl-70, ANCA screen, MPO and PR-3 antibodies, Total CK, Aldolase Hypersensitivity Pneumonitis Panel

## 2016-06-23 NOTE — Assessment & Plan Note (Signed)
Multifactorial - pulm fibrosis, ILD, bronchiectasis  Spirometry - FEV1 63%, FEV1/FVC 58% - moderate obstruction.   Plan - pulmonary hygiene - breo trial - bronchoscopy with BAL and TBBx

## 2016-06-24 ENCOUNTER — Telehealth: Payer: Self-pay | Admitting: *Deleted

## 2016-06-24 LAB — MISC LABCORP TEST (SEND OUT)
LABCORP TEST CODE: 9985
LabCorp test name: 9985

## 2016-06-24 NOTE — Telephone Encounter (Signed)
LMOVM for pt to return call in regards to her bronch procedure date. Did LMOVM on 06/23/16 @ 8:42am.

## 2016-06-25 LAB — HYPERSENSITIVITY PNEUMONITIS
A. FUMIGATUS #1 ABS: NEGATIVE
A. Pullulans Abs: NEGATIVE
MICROPOLYSPORA FAENI IGG: NEGATIVE
Pigeon Serum Abs: NEGATIVE
THERMOACTINOMYCES VULGARIS IGG: NEGATIVE
Thermoact. Saccharii: NEGATIVE

## 2016-06-25 NOTE — Telephone Encounter (Signed)
LMOVM for pt to return call. Also LMOM of daughter's phone asking to have pt to give me a call so I can give Bronch procedure date.

## 2016-06-28 NOTE — Telephone Encounter (Signed)
Pt informed that her bronch is 07/05/16 @ 1pm and needs to arrive at 12pm at [facility]. Nothing further needed.

## 2016-06-28 NOTE — Telephone Encounter (Signed)
Pt returning our call °Please call back ° °

## 2016-07-05 ENCOUNTER — Ambulatory Visit
Admission: RE | Admit: 2016-07-05 | Discharge: 2016-07-05 | Disposition: A | Discharge status: Home / Self Care | Payer: Medicare Other | Source: Ambulatory Visit | Attending: Internal Medicine | Admitting: Internal Medicine

## 2016-07-05 ENCOUNTER — Encounter
Admission: RE | Disposition: A | Discharge status: Home / Self Care | Payer: Self-pay | Source: Ambulatory Visit | Attending: Internal Medicine

## 2016-07-05 DIAGNOSIS — J8410 Pulmonary fibrosis, unspecified: Secondary | ICD-10-CM | POA: Diagnosis not present

## 2016-07-05 DIAGNOSIS — R05 Cough: Secondary | ICD-10-CM | POA: Diagnosis not present

## 2016-07-05 DIAGNOSIS — J984 Other disorders of lung: Secondary | ICD-10-CM | POA: Insufficient documentation

## 2016-07-05 DIAGNOSIS — Z79899 Other long term (current) drug therapy: Secondary | ICD-10-CM | POA: Diagnosis not present

## 2016-07-05 DIAGNOSIS — J849 Interstitial pulmonary disease, unspecified: Secondary | ICD-10-CM | POA: Diagnosis not present

## 2016-07-05 DIAGNOSIS — J479 Bronchiectasis, uncomplicated: Secondary | ICD-10-CM | POA: Insufficient documentation

## 2016-07-05 DIAGNOSIS — I700 Atherosclerosis of aorta: Secondary | ICD-10-CM | POA: Insufficient documentation

## 2016-07-05 DIAGNOSIS — J470 Bronchiectasis with acute lower respiratory infection: Secondary | ICD-10-CM | POA: Diagnosis not present

## 2016-07-05 HISTORY — PX: FLEXIBLE BRONCHOSCOPY: SHX5094

## 2016-07-05 SURGERY — BRONCHOSCOPY, FLEXIBLE
Anesthesia: Moderate Sedation

## 2016-07-05 MED ORDER — MIDAZOLAM HCL 5 MG/5ML IJ SOLN
INTRAMUSCULAR | Status: AC
  Filled 2016-07-05: qty 5

## 2016-07-05 MED ORDER — LIDOCAINE HCL 2 % EX GEL: 1.0000 "application " | Freq: Once | CUTANEOUS | Status: DC

## 2016-07-05 MED ORDER — MIDAZOLAM HCL 2 MG/2ML IJ SOLN
INTRAMUSCULAR | Status: AC | PRN
  Administered 2016-07-05: 1 mg via INTRAVENOUS
  Administered 2016-07-05 (×2): 0.5 mg via INTRAVENOUS
  Administered 2016-07-05: 2 mg via INTRAVENOUS

## 2016-07-05 MED ORDER — FENTANYL CITRATE (PF) 100 MCG/2ML IJ SOLN
INTRAMUSCULAR | Status: AC
  Filled 2016-07-05: qty 4

## 2016-07-05 MED ORDER — FENTANYL CITRATE (PF) 100 MCG/2ML IJ SOLN
INTRAMUSCULAR | Status: AC | PRN
  Administered 2016-07-05 (×3): 50 ug via INTRAVENOUS

## 2016-07-05 MED ORDER — PHENYLEPHRINE HCL 0.25 % NA SOLN
1.0000 | Freq: Four times a day (QID) | NASAL | Status: DC | PRN
  Filled 2016-07-05: qty 15

## 2016-07-05 NOTE — H&P (Signed)
Patient seen and examined, H&P per clinic note on 06/18/2016. No significant changes. Images reviewed, and procedure(s) planned accordingly.  82 yo female with chronic cough, now with imaging of bronchiectasis/pulm fibrosis/honeycombing, seen for follow up visit.  ILD (interstitial lung disease) ([place]) CT with progression of pulm fib along with honeycombing.  Suspect underlyling ILD in addition to pulm fibrosis.  She also has moderate to extensive bronchiectasis especially at the lung bases.   Plan - bronchoscopy for BAL and TBBx    I, the ordering provider, attest that I have discussed with the patient the benefits, risks, side effects, alternatives, likelihood of achieving goals and potential problems during recovery for the procedure that I have provided informed consent.   Vilinda Boehringer, MD [facility] Pager [phone] (please enter 7-digits) On Call Pager - [phone] (please enter 7-digits) Clinic - [phone]

## 2016-07-05 NOTE — Op Note (Signed)
[facility] Patient Name: Crystal Gregory Procedure Date: 07/05/2016 12:53 PM MRN: [id number] Account #: [id number] Date of Birth: [birth year] Admit Type: Outpatient Age: 82 Room: [facility] PROCEDURE RM 02 Gender: Female Note Status: Finalized Attending MD: Vilinda Boehringer , MD Procedure:         Bronchoscopy Indications:       Interstitial lung disease, Chronic cough with abnormal CT Providers:         Vilinda Boehringer, MD Referring MD:       Medicines:         Lidocaine 1% applied to cords 3 mL, Lidocaine 1% applied                     to the tracheobronchial tree 6 mL Complications:     No immediate complications. Estimated blood loss: None Procedure:         Pre-Anesthesia Assessment:                    - A History and Physical has been performed. Patient meds                     and allergies have been reviewed. The risks and benefits                     of the procedure and the sedation options and risks were                     discussed with the patient. All questions were answered                     and informed consent was obtained. Patient identification                     and proposed procedure were verified prior to the                     procedure by the physician in the pre-procedure area.                     Mental Status Examination: alert and oriented. Airway                     Examination: normal oropharyngeal airway. Respiratory                     Examination: clear to auscultation. CV Examination:                     normal. After reviewing the risks and benefits, the                     patient was deemed in satisfactory condition to undergo                     the procedure. The anesthesia plan was to use moderate                     sedation / analgesia (conscious sedation). Immediately                     prior to administration of medications, the patient was                     re-assessed for adequacy to receive sedatives. The heart  rate, respiratory rate, oxygen saturations, blood                     pressure, adequacy of pulmonary ventilation, and response                     to care were monitored throughout the procedure. The                     physical status of the patient was re-assessed after the                     procedure.                    After obtaining informed consent, the bronchoscope was                     passed under direct vision. Throughout the procedure, the                     patient's blood pressure, pulse, and oxygen saturations                     were monitored continuously. The procedure was                     accomplished without difficulty. the Bronchoscope Olympus                     BF-1T180 [id number] was introduced through the mouth and                     advanced to the trachea. Findings:      Bronchoalveolar lavage was performed in the left lower lobe of the lung       and sent for cell count, bacterial culture, viral smears & culture, and       fungal & AFB analysis and cytology and routine cytology. 50 mL of fluid       were instilled. 40 mL were returned. The return was milky. Mucous plugs       were present in the return fluid.      Transbronchial biopsies of an area of infiltration were performed in the       anterior medial segment of the left lower lobe using forceps and sent       for routine cytology. Biopsy of lung tissue was obtained. Three biopsy       passes were performed. Three biopsy samples were obtained. Impression:        - Interstitial lung disease                    - Chronic cough with abnormal CT                    - Bronchoalveolar lavage was performed.                    - Transbronchial lung biopsies were performed. Recommendation:    - Await test results. Vilinda Boehringer, MD 07/05/2016 1:31:06 PM This report has been signed electronically. Number of Addenda: 0 Note Initiated On: 07/05/2016 12:53 PM      [facility]

## 2016-07-05 NOTE — OR Nursing (Signed)
Error entered in EPIC for postop arrival from PACU, unable to delete from epic documentation.  Pt in Specials Recovery, not SDS postop.

## 2016-07-06 LAB — RESPIRATORY PANEL BY PCR
Adenovirus: NOT DETECTED
Bordetella pertussis: NOT DETECTED
CORONAVIRUS OC43-RVPPCR: NOT DETECTED
Chlamydophila pneumoniae: NOT DETECTED
Coronavirus 229E: NOT DETECTED
Coronavirus HKU1: NOT DETECTED
Coronavirus NL63: NOT DETECTED
INFLUENZA A-RVPPCR: NOT DETECTED
INFLUENZA B-RVPPCR: NOT DETECTED
METAPNEUMOVIRUS-RVPPCR: NOT DETECTED
Mycoplasma pneumoniae: NOT DETECTED
PARAINFLUENZA VIRUS 1-RVPPCR: NOT DETECTED
PARAINFLUENZA VIRUS 2-RVPPCR: NOT DETECTED
PARAINFLUENZA VIRUS 3-RVPPCR: NOT DETECTED
PARAINFLUENZA VIRUS 4-RVPPCR: NOT DETECTED
RESPIRATORY SYNCYTIAL VIRUS-RVPPCR: NOT DETECTED
Rhinovirus / Enterovirus: NOT DETECTED

## 2016-07-06 LAB — ACID FAST SMEAR (AFB, MYCOBACTERIA): Acid Fast Smear: NEGATIVE

## 2016-07-06 LAB — CYTOLOGY - NON PAP

## 2016-07-06 LAB — SURGICAL PATHOLOGY

## 2016-07-07 ENCOUNTER — Encounter: Payer: Self-pay | Admitting: Family Medicine

## 2016-07-07 LAB — CULTURE, BAL-QUANTITATIVE W GRAM STAIN

## 2016-07-07 LAB — CULTURE, BAL-QUANTITATIVE
CULTURE: NORMAL — AB
SPECIAL REQUESTS: NORMAL

## 2016-07-08 MED ORDER — PANTOPRAZOLE SODIUM 20 MG PO TBEC: 20.0000 mg | DELAYED_RELEASE_TABLET | Freq: Every day | ORAL | 1 refills | Status: DC

## 2016-07-08 MED ORDER — TRAZODONE HCL 50 MG PO TABS: ORAL_TABLET | ORAL | 1 refills | Status: DC

## 2016-07-08 MED ORDER — PROPRANOLOL HCL 10 MG PO TABS: ORAL_TABLET | ORAL | 1 refills | Status: DC

## 2016-07-12 ENCOUNTER — Ambulatory Visit (INDEPENDENT_AMBULATORY_CARE_PROVIDER_SITE_OTHER): Payer: Medicare Other | Admitting: Family Medicine

## 2016-07-12 ENCOUNTER — Encounter: Payer: Self-pay | Admitting: Family Medicine

## 2016-07-12 VITALS — BP 122/52 | HR 73 | Temp 97.6°F | Wt 115.0 lb

## 2016-07-12 DIAGNOSIS — J849 Interstitial pulmonary disease, unspecified: Secondary | ICD-10-CM

## 2016-07-12 DIAGNOSIS — R9431 Abnormal electrocardiogram [ECG] [EKG]: Secondary | ICD-10-CM | POA: Insufficient documentation

## 2016-07-12 LAB — COMPREHENSIVE METABOLIC PANEL
ALK PHOS: 48 U/L (ref 39–117)
ALT: 12 U/L (ref 0–35)
AST: 21 U/L (ref 0–37)
Albumin: 3.7 g/dL (ref 3.5–5.2)
BILIRUBIN TOTAL: 0.6 mg/dL (ref 0.2–1.2)
BUN: 13 mg/dL (ref 6–23)
CALCIUM: 9.7 mg/dL (ref 8.4–10.5)
CO2: 30 mEq/L (ref 19–32)
Chloride: 100 mEq/L (ref 96–112)
Creatinine, Ser: 0.82 mg/dL (ref 0.40–1.20)
GFR: 70.84 mL/min (ref >60.00)
Glucose, Bld: 109 mg/dL — ABNORMAL HIGH (ref 70–99)
Potassium: 4.1 mEq/L (ref 3.5–5.1)
Sodium: 136 mEq/L (ref 135–145)
TOTAL PROTEIN: 8.2 g/dL (ref 6.0–8.3)

## 2016-07-12 LAB — TSH: TSH: 1.7 u[IU]/mL (ref 0.35–4.50)

## 2016-07-12 NOTE — Assessment & Plan Note (Signed)
She is understandably disheartened by how rapidly this disease has hit her.

## 2016-07-12 NOTE — Progress Notes (Addendum)
Subjective:   Patient ID: Crystal Gregory, female    DOB: [birth year], 82 y.o.   MRN: [id number]  Crystal Gregory is a pleasant 82 y.o. year old female who presents to clinic today with Follow-up (discuss bronchoscopy results)  on 07/12/2016  HPI:  Bronchiectasis/pulmonary fibrosis- recent diagnosis.  Now followed by pulmonary, Dr. Stevenson Clinch.  Bronchoscopy on 07/05/16.  Per pt,  While she was awaiting bronchoscopy and on cardiac monitor, the nurse caring for her commented that she had abnormal T waves.  Advised that she follow up with me here today.  Bronchoscopy results reviewed- consistent with interstitial lung disease, biopsy results reviewed.  Has appt with him on 11/23.  She has been feeling more short of breath, with intermittent chest tightness.  Inhalers do not seem to help much.    Wt Readings from Last 3 Encounters:  07/12/16 115 lb (52.2 kg)  07/05/16 113 lb (51.3 kg)  06/18/16 117 lb (53.1 kg)   Current Outpatient Prescriptions on File Prior to Visit  Medication Sig Dispense Refill  . albuterol (PROVENTIL HFA;VENTOLIN HFA) 108 (90 Base) MCG/ACT inhaler Inhale 2 puffs into the lungs every 6 (six) hours as needed. 1 Inhaler 0  . Calcium Carbonate-Vit D-Min (CALCIUM 1200 PO) Take by mouth.    . denosumab (PROLIA) 60 MG/ML SOLN injection Inject 60 mg into the skin every 6 (six) months. Administer in upper arm, thigh, or abdomen    . nitrofurantoin (MACRODANTIN) 50 MG capsule Take one by mouth daily 90 capsule 0  . pantoprazole (PROTONIX) 20 MG tablet Take 1 tablet (20 mg total) by mouth daily. 90 tablet 1  . propranolol (INDERAL) 10 MG tablet TAKE 1 TABLET TWICE A DAY 180 tablet 1  . traZODone (DESYREL) 50 MG tablet TAKE 1 TABLET AT BEDTIME 90 tablet 1   No current facility-administered medications on file prior to visit.     No Known Allergies  Past Medical History:  Diagnosis Date  . Allergy   . GERD (gastroesophageal reflux disease)   . Septicemia ([place]) 1999   spent  4 weeks in ICU, intubated -? PNA    Past Surgical History:  Procedure Laterality Date  . ABDOMINAL HYSTERECTOMY    . FLEXIBLE BRONCHOSCOPY N/A 07/05/2016   Procedure: FLEXIBLE BRONCHOSCOPY;  Surgeon: Vilinda Boehringer, MD;  Location: [facility] ORS;  Service: Cardiopulmonary;  Laterality: N/A;    Family History  Problem Relation Age of Onset  . Parkinson's disease Mother   . Colon polyps Mother 59       . Breast cancer Sister     Social History   Social History  . Marital status: Married    Spouse name: N/A  . Number of children: 2  . Years of education: N/A   Occupational History  . Retired     Social History Main Topics  . Smoking status: Former Smoker    Packs/day: 0.50    Years: 30.00    Quit date: 01/16/1981  . Smokeless tobacco: Never Used  . Alcohol use Yes     Comment: 2 drinks daily   . Drug use: No  . Sexual activity: Not on file   Other Topics Concern  . Not on file   Social History Narrative   Married.     2 children.  Lives with her husband in [place].   The PMH, PSH, Social History, Family History, Medications, and allergies have been reviewed in [facility], and have been updated if relevant.   Review of Systems  Constitutional: Positive for appetite change and unexpected weight change. Negative for fever.  HENT: Negative for trouble swallowing and voice change.   Respiratory: Positive for cough and shortness of breath. Negative for wheezing and stridor.   Cardiovascular: Negative.   All other systems reviewed and are negative.      Objective:    BP (!) 122/52   Pulse 73   Temp 97.6 F (36.4 C) (Oral)   Wt 115 lb (52.2 kg)   SpO2 97%   BMI 17.49 kg/m  Wt Readings from Last 3 Encounters:  07/12/16 115 lb (52.2 kg)  07/05/16 113 lb (51.3 kg)  06/18/16 117 lb (53.1 kg)     Physical Exam  Constitutional: She is oriented to person, place, and time.  Thin, NAD  HENT:  Head: Normocephalic.  Eyes: Conjunctivae are normal.  Cardiovascular: Normal  rate and regular rhythm.   Pulmonary/Chest: Effort normal. She has no wheezes.  Musculoskeletal: Normal range of motion.  Neurological: She is alert and oriented to person, place, and time. No cranial nerve deficit.  Skin: Skin is warm and dry.  Psychiatric: She has a normal mood and affect. Her behavior is normal. Judgment and thought content normal.  Nursing note and vitals reviewed.         Assessment & Plan:   ILD (interstitial lung disease) ([place])  Abnormal EKG No Follow-up on file.

## 2016-07-12 NOTE — Patient Instructions (Signed)
Great to see you. Please stop by to see Crystal Gregory on your way out(after you go to the lab).  Please say hi to [facility] for me.

## 2016-07-12 NOTE — Progress Notes (Signed)
Pre visit review using our clinic review tool, if applicable. No additional management support is needed unless otherwise documented below in the visit note. 

## 2016-07-12 NOTE — Assessment & Plan Note (Signed)
EKG does show some nonspecific ST depression. Given strain on heart of ILD, will refer to cardiology.  No old EKG for comparison. Labs today as well. Orders Placed This Encounter  Procedures  . Comprehensive metabolic panel  . TSH  . Ambulatory referral to Cardiology  . EKG 12-Lead

## 2016-07-14 ENCOUNTER — Encounter: Payer: Self-pay | Admitting: Family Medicine

## 2016-07-16 ENCOUNTER — Telehealth: Payer: Self-pay | Admitting: Internal Medicine

## 2016-07-16 MED ORDER — PREDNISONE 20 MG PO TABS: 20.0000 mg | ORAL_TABLET | Freq: Every day | ORAL | 0 refills | Status: DC

## 2016-07-16 NOTE — Telephone Encounter (Signed)
rx sent to preferred pharmacy. LM to inform pt of this. Nothing further needed.

## 2016-07-16 NOTE — Telephone Encounter (Signed)
Spoke with patient, updated her on the results from her bronchoscopy. Preliminary on a fungal culture showed possible candida, BAL. BAL shows normal respiratory flora Surgical pathology shows acute inflammatory process.  Patient states that she still has a cough, with moderate amounts of white to yellow thick productive sputum. She still exercising shortness of breath.  She has a follow-up appointment on 07/26/2016.  In the interim she has not received any information on following up in our ILD specialist, Dr. Chase Caller, which we will look into.  Also, will place her on prednisone 20 mg, 1 tab by mouth daily 7 days; take with breakfast.  Vilinda Boehringer, MD [facility]

## 2016-07-19 ENCOUNTER — Telehealth: Payer: Self-pay | Admitting: Internal Medicine

## 2016-07-19 NOTE — Telephone Encounter (Signed)
Patient needs OV with MR for ILD f/u per telephone note -- Dr Vaughan Browner patient.  Dr Chase Caller please advise if you are okay with seeing this patient in a 59min slot as you have not 22min consult slots open.

## 2016-07-21 NOTE — Telephone Encounter (Signed)
Crystal Dan do you want to schedule this pt on MR schedule?  thanks

## 2016-07-21 NOTE — Telephone Encounter (Signed)
15 min fine . AM appt ony. I can start at 8.45am that day if that owuld help getting into 92min slot which I prefer  ,Dr. Brand Males, M.D., [facility].C.P Pulmonary and Critical Care Medicine Staff Physician [place] [facility] Pager: [phone], If no answer or between  15:00h - 7:00h: call [phone]  07/21/2016 1:22 PM

## 2016-07-21 NOTE — Telephone Encounter (Signed)
Called and spoke to pt. Appt scheduled for 10.31.17 at 9:45 (30 min slot). Pt verbalized understanding and denied any further questions or concerns at this time.

## 2016-07-26 ENCOUNTER — Ambulatory Visit (INDEPENDENT_AMBULATORY_CARE_PROVIDER_SITE_OTHER): Payer: Medicare Other | Admitting: Internal Medicine

## 2016-07-26 ENCOUNTER — Encounter: Payer: Self-pay | Admitting: Internal Medicine

## 2016-07-26 VITALS — BP 100/58 | HR 82 | Ht 68.0 in | Wt 114.0 lb

## 2016-07-26 DIAGNOSIS — J22 Unspecified acute lower respiratory infection: Secondary | ICD-10-CM | POA: Diagnosis not present

## 2016-07-26 DIAGNOSIS — J470 Bronchiectasis with acute lower respiratory infection: Secondary | ICD-10-CM | POA: Diagnosis not present

## 2016-07-26 DIAGNOSIS — J8410 Pulmonary fibrosis, unspecified: Secondary | ICD-10-CM | POA: Diagnosis not present

## 2016-07-26 LAB — CULTURE, FUNGUS WITHOUT SMEAR: SPECIAL REQUESTS: NORMAL

## 2016-07-26 MED ORDER — FLUTTER DEVI: 0 refills | Status: DC

## 2016-07-26 MED ORDER — AMBULATORY NON FORMULARY MEDICATION: 0 refills | Status: DC

## 2016-07-26 NOTE — Assessment & Plan Note (Signed)
Recently with Flare - steroid responsive Pulmonary hygiene - flutter valve and ICS for now, if symptoms continue or with more flares, will need to consider pharmacologic mucocillary cleareance BAL 10/2 - Candida - possible colonizer, if flares are more frequent with need to consider treating.

## 2016-07-26 NOTE — Assessment & Plan Note (Signed)
Chronic, but getting slightly worst over time.  Had a recent flare - responded well to 20mg  Prednisone x 7 days.   Plan - autoimmune workup currently negative - forceps TBBx negative from recent bronch - follow with ILD specialist, Dr. Chase Caller

## 2016-07-26 NOTE — Assessment & Plan Note (Signed)
Noted more in the bases. Chronic bronchiectasis, but noted to be worsening over the past several months, along with clinical aspect of cough and suspected recurrent URI. - had a recent flare with that responded well to steroids.  Has already completed multiple courses of antibiotics an steroids, and seems to be steroid responsive. Further ILD eval by Dr. Chase Caller scheduled.   Plan - pulmonary hygiene  - incentive spirometry and flutter valve - advised to use PPI in the AM.

## 2016-07-26 NOTE — Progress Notes (Signed)
[place] [facility] Consultation      MRN# [id number] Crystal Gregory Mar 09, 1932   CC: Chief Complaint  Patient presents with  . Follow-up    f/u bronch; SOB w/exertion; chest tightness: no cough since Pednisone, completed last Thursday.      Brief History: 82 yo seen in [place] for Cough, had PNA in 1999 (sepsis and intubated), also with bronchiectasis and pulm fib. Vasculitis work up negative,follow up with MR in [place] planned for ILD/PF treatment options.     Events since last clinic visit: Mild worsening of SOB Moderate nasal congestion Has some cough with mild thick, whitish/greenish/yellowish, sputum No fevers, night sweat   Had a Bronch with BAL and TBBx forcep biopsies - currently negative path, candida (possible colonization) in BAL   Current Outpatient Prescriptions:  .  albuterol (PROVENTIL HFA;VENTOLIN HFA) 108 (90 Base) MCG/ACT inhaler, Inhale 2 puffs into the lungs every 6 (six) hours as needed., Disp: 1 Inhaler, Rfl: 0 .  Calcium Carbonate-Vit D-Min (CALCIUM 1200 PO), Take by mouth., Disp: , Rfl:  .  denosumab (PROLIA) 60 MG/ML SOLN injection, Inject 60 mg into the skin every 6 (six) months. Administer in upper arm, thigh, or abdomen, Disp: , Rfl:  .  nitrofurantoin (MACRODANTIN) 50 MG capsule, Take one by mouth daily, Disp: 90 capsule, Rfl: 0 .  pantoprazole (PROTONIX) 20 MG tablet, Take 1 tablet (20 mg total) by mouth daily., Disp: 90 tablet, Rfl: 1 .  propranolol (INDERAL) 10 MG tablet, TAKE 1 TABLET TWICE A DAY, Disp: 180 tablet, Rfl: 1 .  traZODone (DESYREL) 50 MG tablet, TAKE 1 TABLET AT BEDTIME, Disp: 90 tablet, Rfl: 1   Review of Systems  Constitutional: Negative for chills and fever.  HENT: Positive for congestion.   Respiratory: Positive for cough, sputum production and shortness of breath.   Cardiovascular: Negative for chest pain and palpitations.  Gastrointestinal: Negative for heartburn.  Genitourinary: Negative for dysuria.    Endo/Heme/Allergies: Does not bruise/bleed easily.      Allergies:  Review of patient's allergies indicates no known allergies.  Physical Examination:  VS: There were no vitals taken for this visit.  General Appearance: No distress  HEENT: PERRLA, no ptosis, no other lesions noticed Pulmonary:normal breath sounds., diaphragmatic excursion normal.No wheezing, No rales   Cardiovascular:  Normal S1,S2.  No m/r/g.     Abdomen:Exam: Benign, Soft, non-tender, No masses  Skin:   warm, no rashes, no ecchymosis  Extremities: normal, no cyanosis, clubbing, warm with normal capillary refill.      Lab studies Bronch with BAL 07/05/16 Specimen Description BRONCHIAL WASHINGS   Special Requests Normal   Gram Stain FEW WBC PRESENT, PREDOMINANTLY PMN  FEW GRAM POSITIVE COCCI IN PAIRS AND CHAINS  FEW GRAM POSITIVE COCCI IN CLUSTERS  FEW GRAM POSITIVE RODS  RARE GRAM NEGATIVE COCCOBACILLI       Culture  (A)  >=100,000 COLONIES/mL Consistent with normal respiratory flora. Performed at [facility]   Report Status 07/07/2016 FINAL    Specimen Description BRONCHIAL WASHINGS   Special Requests Normal   Culture CANDIDA ALBICANS    Report Status PENDING   Resulting Agency [id number]      Results for Crystal Gregory, Crystal Gregory (MRN [id number]) as of 07/26/2016 09:35  Ref. Range 06/21/2016 14:12  ANCA Proteinase 3 Latest Ref Range: 0.0 - 3.5 U/mL <3.5  Angiotensin-Converting Enzyme Latest Ref Range: 14 - 82 U/L 29  ds DNA Ab Latest Ref Range: 0 - 9 IU/mL 7  Myeloperoxidase Abs Latest Ref  Range: 0.0 - 9.0 U/mL <9.0  RA Latex Turbid. Latest Ref Range: 0.0 - 13.9 IU/mL 14.8 (H)  Cytoplasmic (C-ANCA) Latest Ref Range: Neg:<1:20 titer <1:20  P-ANCA Latest Ref Range: Neg:<1:20 titer <1:20  Atypical P-ANCA titer Latest Ref Range: Neg:<1:20 titer <1:20  Scleroderma (Scl-70) (ENA) Antibody, IgG Latest Ref Range: 0.0 - 0.9 AI <0.2    DIAGNOSIS:  A. LUNG, LEFT LOWER LOBE; FORCEPS BIOPSY:  -  RESPIRATORY MUCOSA AND CALCIFIED CARTILAGE.  - LUNG PARENCHYMA NOT SAMPLED.   Assessment and Plan:82 yo female with chronic cough, now with imaging of bronchiectasis/pulm fibrosis/honeycombing, seen for follow up visit.  Pulmonary fibrosis (HCC) Chronic, but getting slightly worst over time.  Had a recent flare - responded well to 20mg  Prednisone x 7 days.   Plan - autoimmune workup currently negative - forceps TBBx negative from recent bronch - follow with ILD specialist, Dr. Chase Caller     Lower respiratory tract infection Recently with Flare - steroid responsive Pulmonary hygiene - flutter valve and ICS for now, if symptoms continue or with more flares, will need to consider pharmacologic mucocillary cleareance BAL 10/2 - Candida - possible colonizer, if flares are more frequent with need to consider treating.   Bronchiectasis with acute lower respiratory infection ([place]) Noted more in the bases. Chronic bronchiectasis, but noted to be worsening over the past several months, along with clinical aspect of cough and suspected recurrent URI. - had a recent flare with that responded well to steroids.  Has already completed multiple courses of antibiotics an steroids, and seems to be steroid responsive. Further ILD eval by Dr. Chase Caller scheduled.   Plan - pulmonary hygiene  - incentive spirometry and flutter valve - advised to use PPI in the AM.    Updated Medication List Outpatient Encounter Prescriptions as of 07/26/2016  Medication Sig  . albuterol (PROVENTIL HFA;VENTOLIN HFA) 108 (90 Base) MCG/ACT inhaler Inhale 2 puffs into the lungs every 6 (six) hours as needed.  . Calcium Carbonate-Vit D-Min (CALCIUM 1200 PO) Take by mouth.  . denosumab (PROLIA) 60 MG/ML SOLN injection Inject 60 mg into the skin every 6 (six) months. Administer in upper arm, thigh, or abdomen  . nitrofurantoin (MACRODANTIN) 50 MG capsule Take one by mouth daily  . pantoprazole (PROTONIX) 20 MG tablet Take  1 tablet (20 mg total) by mouth daily.  . propranolol (INDERAL) 10 MG tablet TAKE 1 TABLET TWICE A DAY  . traZODone (DESYREL) 50 MG tablet TAKE 1 TABLET AT BEDTIME  . [DISCONTINUED] predniSONE (DELTASONE) 20 MG tablet Take 1 tablet (20 mg total) by mouth daily.   No facility-administered encounter medications on file as of 07/26/2016.     Orders for this visit: No orders of the defined types were placed in this encounter.   Thank  you for the visitation and for allowing  [facility] to assist in the care of your patient. Our recommendations are noted above.  Please contact us if we can be of further service.  Vilinda Boehringer, MD  Pulmonary and Critical Care Office Number: [phone]  Note: This note was prepared with Dragon dictation along with smaller phrase technology. Any transcriptional errors that result from this process are unintentional.

## 2016-07-26 NOTE — Patient Instructions (Signed)
Follow up with Dr. Alva Garnet in 3 months - cont with f/u with Dr. Chase Caller in [place] with current meds and allergy control - flutter valve and spirometer - 5-10 times each daily

## 2016-08-03 ENCOUNTER — Other Ambulatory Visit: Payer: Self-pay | Admitting: *Deleted

## 2016-08-03 ENCOUNTER — Other Ambulatory Visit: Payer: Self-pay | Admitting: Internal Medicine

## 2016-08-03 ENCOUNTER — Encounter: Payer: Self-pay | Admitting: Internal Medicine

## 2016-08-03 ENCOUNTER — Ambulatory Visit (INDEPENDENT_AMBULATORY_CARE_PROVIDER_SITE_OTHER): Payer: Medicare Other | Admitting: Internal Medicine

## 2016-08-03 VITALS — BP 110/60 | HR 69 | Ht 68.0 in | Wt 116.8 lb

## 2016-08-03 DIAGNOSIS — J849 Interstitial pulmonary disease, unspecified: Secondary | ICD-10-CM

## 2016-08-03 DIAGNOSIS — J479 Bronchiectasis, uncomplicated: Secondary | ICD-10-CM | POA: Diagnosis not present

## 2016-08-03 DIAGNOSIS — J84112 Idiopathic pulmonary fibrosis: Secondary | ICD-10-CM

## 2016-08-03 NOTE — Patient Instructions (Addendum)
ICD-9-CM ICD-10-CM   1. ILD (interstitial lung disease) ([place]) 515 J84.9   2. Bronchiectasis without complication (HCC) A999333 J47.9   3. IPF (idiopathic pulmonary fibrosis) (HCC) 516.31 J84.112     - Most likely you have bronchiectasis following ARDS - Superimposed on that he probably had some post inflammatory pulmonary fibrosis and the infection in spring 2017 flared this up - Alternatively, you've developed idiopathic primary fibrosis on top of the bronchiectasis  Plan - Do full pulmonary function testing in the next few weeks at your convenience - Take brochure on the 2 drugs for pulmonary fibrosis which I have outlined below  - Refer pulmonary rehabitation at [facility]  Follow-up - Next few weeks after pulmonary function testing to discuss anti-fibrotic therapy - do ACCP ILD questin next visit   ............... Both drugs OFEV and Esbriet only slow down progression, 1 out of 6 patients  - this means extension in quality of life but no difference in symptoms  - no study directly compares the 2 drugs but efficacy roughly equal at 1 year time point   - OFEV  - - time to first exacerbation possibly reduced in one trial but not in another - twice daily, no titration, potentially more convenient dosing  - no need for sunscreen  - high chance of mild diarrhea but low chance of significant diarrhea needing to stop medication.   - Rx diarrhea with lomotil - slight increase in heart attack risk and theoretical increase in bleeding risk,   - need monthly blood work for 3 months and then every 6 months - monitor liver function   - ESBRIET  - 3 pill three times daily, slow titration.  - Need to wean sunscreen  - Some chance of nausea and anorexia with small chance for diarrhea  - no known heart attack risk - no known bleeding risk,   - need monthly blood work for 6 months - monitor liver function - possible mortality benefit in pooled analysis  - larger  world wide experience

## 2016-08-03 NOTE — Progress Notes (Signed)
Subjective:     Patient ID: Crystal Gregory, female   DOB: Jan 11, 1932, 82 y.o.   MRN: [id number]  HPI     OV 08/03/2016  Chief Complaint  Patient presents with  . Follow-up    Pt states SOB. SOB w/ excertion. Pt states SOB all the time. Pt states pressure in chest. No productive cough     82 year old female accompanied by her husband. She is a retired Marine scientist. Her husband worked in [employer] and was in [place]. She tells me that in 1999 when she was 82 years of age she was admitted with septicemia and pneumonia. 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 She says that it over 3 years to recover. She was on long-term oxygen therapy. She never required tracheostomy. She recovered from this and that is completely healthy life without any shortness of breath or any cough. In the interim she did not have any pulmonary imaging. 11 years ago her husband suffered a massive myocardial infarction and has been on a pacemaker ever since. On the daily walks patient would always wait for her husband. Some 10 years ago. Climb mountains in [place]. Even as of a year ago was able to do walks at normal ris outpatient husband without any shortness of breath. Then approximately in spring 2017 March/April 2017 she got she suffered a cold and after this was left with some persistent cough and shortness of breath. This was associated with loss of hair growth and nail growth and significant weight loss with these are recovering now. Symptoms just got worse over time. There was not much of sputum production. This then resulted in CT chest 03/22/2016 that shows diffuse bronchiectasis with some fibrotic pattern at the lung base. The CT chest was not a high-resolution CT chest. She was then referred to Dr. Stevenson Clinch. I reviewed his notes. He did autoimmune workup 06/21/2016 that was essentially negative except for borderline elevation rheumatoid factor. A high-resolution CT  chest 06/11/2016 that I personally visualized and reported below shows diffuse bronchiectasis but superimposed on that. Bibasal subpleural fibrosis pattern with some honeycombing that is consistent with UIP Personal opinion and have ordered by Dr. Salvatore Marvel r radiologist. Chest spirometry 06/18/2016 that shows a mixed restrictive obstructive pattern. Do not have full pulmonary function test. She then underwent bronchoscopy with transrectal biopsy 07/05/2016 that is nondiagnostic.   Currently appears somewhat better on September October 2017 she was treated with a short course prednisone and antibiotics which helped her cough immensely but her dyspnea is significant and still persistent and is progressive. This despite the short course prednisone. The cough is only mild and is dry.  Noted breo did not help symptoms  Walking desaturation test 185 feet 3 laps on room air: 98% on all 3 laps and did not desaturate but got dyspneic   ACCP ILD question - patient left befor I could give this to her   has a past medical history of Allergy; GERD (gastroesophageal reflux disease); and Septicemia ([place]) (1999).   reports that she quit smoking about 35 years ago. She has a 15.00 pack-year smoking history. She has never used smokeless tobacco.  Past Surgical History:  Procedure Laterality Date  . ABDOMINAL HYSTERECTOMY    . FLEXIBLE BRONCHOSCOPY N/A 07/05/2016   Procedure: FLEXIBLE BRONCHOSCOPY;  Surgeon: Vilinda Boehringer, MD;  Location: [facility] ORS;  Service: Cardiopulmonary;  Laterality: N/A;    No Known Allergies  Immunization History  Administered Date(s)  Administered  . Influenza Split 06/04/2012  . Influenza,inj,Quad PF,36+ Mos 07/04/2013, 08/18/2015  . Pneumococcal Conjugate-13 10/06/2011  . Zoster 01/14/2007    Family History  Problem Relation Age of Onset  . Parkinson's disease Mother   . Colon polyps Mother 7       . Breast cancer Sister      Current Outpatient Prescriptions:  .   albuterol (PROVENTIL HFA;VENTOLIN HFA) 108 (90 Base) MCG/ACT inhaler, Inhale 2 puffs into the lungs every 6 (six) hours as needed., Disp: 1 Inhaler, Rfl: 0 .  AMBULATORY NON FORMULARY MEDICATION, Medication Name: Incentive Spirometry  Use 10-15 times daily, Disp: 1 each, Rfl: 0 .  Calcium Carbonate-Vit D-Min (CALCIUM 1200 PO), Take by mouth., Disp: , Rfl:  .  denosumab (PROLIA) 60 MG/ML SOLN injection, Inject 60 mg into the skin every 6 (six) months. Administer in upper arm, thigh, or abdomen, Disp: , Rfl:  .  nitrofurantoin (MACRODANTIN) 50 MG capsule, Take one by mouth daily, Disp: 90 capsule, Rfl: 0 .  pantoprazole (PROTONIX) 20 MG tablet, Take 1 tablet (20 mg total) by mouth daily., Disp: 90 tablet, Rfl: 1 .  propranolol (INDERAL) 10 MG tablet, TAKE 1 TABLET TWICE A DAY, Disp: 180 tablet, Rfl: 1 .  Respiratory Therapy Supplies (FLUTTER) DEVI, Use 10-15 times daily, Disp: 1 each, Rfl: 0 .  traZODone (DESYREL) 50 MG tablet, TAKE 1 TABLET AT BEDTIME, Disp: 90 tablet, Rfl: 1    Review of Systems     Objective:   Physical Exam  Constitutional: She is oriented to person, place, and time. She appears well-developed and well-nourished. No distress.  HENT:  Head: Normocephalic and atraumatic.  Right Ear: External ear normal.  Left Ear: External ear normal.  Mouth/Throat: Oropharynx is clear and moist. No oropharyngeal exudate.  Eyes: Conjunctivae and EOM are normal. Pupils are equal, round, and reactive to light. Right eye exhibits no discharge. Left eye exhibits no discharge. No scleral icterus.  Neck: Normal range of motion. Neck supple. No JVD present. No tracheal deviation present. No thyromegaly present.  Cardiovascular: Normal rate, regular rhythm, normal heart sounds and intact distal pulses.  Exam reveals no gallop and no friction rub.   No murmur heard. Pulmonary/Chest: Effort normal. No respiratory distress. She has no wheezes. She has rales. She exhibits no tenderness.  R > L  crackles  Abdominal: Soft. Bowel sounds are normal. She exhibits no distension and no mass. There is no tenderness. There is no rebound and no guarding.  Musculoskeletal: Normal range of motion. She exhibits no edema or tenderness.  Lymphadenopathy:    She has no cervical adenopathy.  Neurological: She is alert and oriented to person, place, and time. She has normal reflexes. No cranial nerve deficit. She exhibits normal muscle tone. Coordination normal.  Skin: Skin is warm and dry. No rash noted. She is not diaphoretic. No erythema. No pallor.  Psychiatric: She has a normal mood and affect. Her behavior is normal. Judgment and thought content normal.  Vitals reviewed.    Vitals:   08/03/16 0934  BP: 110/60  Pulse: 69  SpO2: 99%  Weight: 116 lb 12.8 oz (53 kg)  Height: 5\' 8"  (1.727 m)        Assessment:     .   ICD-9-CM ICD-10-CM   1. ILD (interstitial lung disease) ([place]) 515 J84.9   2. Bronchiectasis without complication (HCC) A999333 J47.9   3. IPF (idiopathic pulmonary fibrosis) (HCC) 516.31 [id number]  Plan:      - Most likely you have bronchiectasis following ARDS - Superimposed on that he probably had some post inflammatory pulmonary fibrosis and the infection in spring 2017 flared this up - Alternatively, you've developed idiopathic primary fibrosis on top of the bronchiectasis  Plan - Do full pulmonary function testing in the next few weeks at your convenience - Take brochure on the 2 drugs for pulmonary fibrosis which I have outlined below  - Refer pulmonary [facility]habitation at [facility]  Follow-up - Next few weeks after pulmonary function testing to discuss anti-fibrotic therapy - do ACCP ILD question at nnext visit   ............... Both drugs OFEV and Esbriet only slow down progression, 1 out of 6 patients  - this means extension in quality of life but no difference in symptoms  - no study directly compares the 2 drugs but efficacy  roughly equal at 1 year time point   - OFEV  - - time to first exacerbation possibly reduced in one trial but not in another - twice daily, no titration, potentially more convenient dosing  - no need for sunscreen  - high chance of mild diarrhea but low chance of significant diarrhea needing to stop medication.   - Rx diarrhea with lomotil - slight increase in heart attack risk and theoretical increase in bleeding risk,   - need monthly blood work for 3 months and then every 6 months - monitor liver function   - ESBRIET  - 3 pill three times daily, slow titration.  - Need to wean sunscreen  - Some chance of nausea and anorexia with small chance for diarrhea  - no known heart attack risk - no known bleeding risk,   - need monthly blood work for 6 months - monitor liver function - possible mortality benefit in pooled analysis  - larger world wide experience           > 50% of this > 40 min visit spent in face to face counseling or/and coordination of care    Dr. Brand Males, M.D., [facility].C.P Pulmonary and Critical Care Medicine Staff Physician [place] [facility] Pager: [phone], If no answer or between  15:00h - 7:00h: call [phone]  08/03/2016 10:28 AM

## 2016-08-04 ENCOUNTER — Ambulatory Visit (INDEPENDENT_AMBULATORY_CARE_PROVIDER_SITE_OTHER): Payer: Medicare Other | Admitting: Cardiovascular Disease

## 2016-08-04 ENCOUNTER — Encounter: Payer: Self-pay | Admitting: Cardiovascular Disease

## 2016-08-04 VITALS — BP 110/58 | HR 70 | Ht 68.0 in | Wt 116.0 lb

## 2016-08-04 DIAGNOSIS — R9431 Abnormal electrocardiogram [ECG] [EKG]: Secondary | ICD-10-CM

## 2016-08-04 DIAGNOSIS — I700 Atherosclerosis of aorta: Secondary | ICD-10-CM

## 2016-08-04 DIAGNOSIS — I2584 Coronary atherosclerosis due to calcified coronary lesion: Secondary | ICD-10-CM

## 2016-08-04 DIAGNOSIS — J479 Bronchiectasis, uncomplicated: Secondary | ICD-10-CM

## 2016-08-04 DIAGNOSIS — R059 Cough, unspecified: Secondary | ICD-10-CM

## 2016-08-04 DIAGNOSIS — I2510 Atherosclerotic heart disease of native coronary artery without angina pectoris: Secondary | ICD-10-CM

## 2016-08-04 DIAGNOSIS — R05 Cough: Secondary | ICD-10-CM | POA: Diagnosis not present

## 2016-08-04 DIAGNOSIS — J84112 Idiopathic pulmonary fibrosis: Secondary | ICD-10-CM

## 2016-08-04 DIAGNOSIS — R0602 Shortness of breath: Secondary | ICD-10-CM

## 2016-08-04 NOTE — Patient Instructions (Signed)
Medication Instructions:   No medication changes made  Labwork:  No new labs needed  Testing/Procedures:  No further testing at this time   Follow-Up: It was a pleasure seeing you in the office today. Please call us if you have new issues that need to be addressed before your next appt.  [phone]  Your physician wants you to follow-up in: as needed  If you need a refill on your cardiac medications before your next appointment, please call your pharmacy.     

## 2016-08-04 NOTE — Progress Notes (Signed)
Cardiology Office Note  Date:  08/04/2016   ID:  Crystal Gregory, DOB November 30, 1931, MRN [id number]  PCP:  [name], MD   Chief Complaint  Patient presents with  . other    Ref by Dr. Deborra Medina for abnormal EKG. Meds reviewed by the pt. verbally.     HPI:  82 year old female   retired Marine scientist, Prior history of septicemia and pneumonia, ARDS in 1999 following by prolonged critical illness,3 years to recover, was on long-term oxygen therapy, recent upper respiratory infection spreading 2017 with persistent cough, shortness of breath who presents by referral from Dr. Deborra Medina for consultation of her E EKG, shortness of breath symptoms.  She was in the hospital having bronchoscopy 07/05/2016, was told that she had abnormal T waves.  Subsequent EKGs have been essentially normal  Great stressful upper respiratory infection in the spring 2017, March and April She had dramatic Weight loss, hair loss she is most troubled by her hair, has not started to grow back yet. Reports that she is drinking ensure/boost to stabilize her weight   Stopped smoking in 1982 Total of 30 years   CT chest 03/22/2016 reviewed with her in detail, images pulled up in the office  diffuse bronchiectasis with some fibrotic pattern at the lung bases  autoimmune workup 06/21/2016 that was essentially negative except for borderline elevation rheumatoid factor.   high-resolution CT chest 06/11/2016  images pulled up in the office shows diffuse bronchiectasis  Mild aortic arch atherosclerosis, descending aorta atherosclerosis Minimal coronary calcification noted   Chest spirometry 06/18/2016 that shows a mixed restrictive obstructive pattern.    October 2017 she was treated with a short course prednisone and antibiotics which helped her cough   reports that she quit smoking about 35 years ago. She has a 15.00 pack-year smoking history.   EKG on today's visit shows normal sinus rhythm with rate 70 bpm, no significant ST or T-wave  changes    PMH:   has a past medical history of Allergy; GERD (gastroesophageal reflux disease); and Septicemia ([place]) (1999).  PSH:    Past Surgical History:  Procedure Laterality Date  . ABDOMINAL HYSTERECTOMY    . FLEXIBLE BRONCHOSCOPY N/A 07/05/2016   Procedure: FLEXIBLE BRONCHOSCOPY;  Surgeon: Vilinda Boehringer, MD;  Location: [facility] ORS;  Service: Cardiopulmonary;  Laterality: N/A;    Current Outpatient Prescriptions  Medication Sig Dispense Refill  . albuterol (PROVENTIL HFA;VENTOLIN HFA) 108 (90 Base) MCG/ACT inhaler Inhale 2 puffs into the lungs every 6 (six) hours as needed. 1 Inhaler 0  . AMBULATORY NON FORMULARY MEDICATION Medication Name: Incentive Spirometry  Use 10-15 times daily 1 each 0  . Calcium Carbonate-Vit D-Min (CALCIUM 1200 PO) Take by mouth.    . denosumab (PROLIA) 60 MG/ML SOLN injection Inject 60 mg into the skin every 6 (six) months. Administer in upper arm, thigh, or abdomen    . nitrofurantoin (MACRODANTIN) 50 MG capsule Take one by mouth daily 90 capsule 0  . pantoprazole (PROTONIX) 20 MG tablet Take 1 tablet (20 mg total) by mouth daily. 90 tablet 1  . propranolol (INDERAL) 10 MG tablet TAKE 1 TABLET TWICE A DAY 180 tablet 1  . Respiratory Therapy Supplies (FLUTTER) DEVI Use 10-15 times daily 1 each 0  . traZODone (DESYREL) 50 MG tablet TAKE 1 TABLET AT BEDTIME 90 tablet 1   No current facility-administered medications for this visit.      Allergies:   Review of patient's allergies indicates no known allergies.   Social History:  The  patient  reports that she quit smoking about 35 years ago. She has a 15.00 pack-year smoking history. She has never used smokeless tobacco. She reports that she drinks alcohol. She reports that she does not use drugs.   Family History:   family history includes Breast cancer in her sister; Colon polyps (age of onset: 42) in her mother; Parkinson's disease in her mother.    Review of Systems: Review of Systems   Constitutional: Negative.   Respiratory: Positive for shortness of breath.   Cardiovascular: Negative.   Gastrointestinal: Negative.   Musculoskeletal: Negative.   Neurological: Negative.   Psychiatric/Behavioral: Negative.   All other systems reviewed and are negative.    PHYSICAL EXAM: VS:  BP (!) 110/58 (BP Location: Left Arm, Patient Position: Sitting, Cuff Size: Normal)   Pulse 70   Ht 5\' 8"  (1.727 m)   Wt 116 lb (52.6 kg)   BMI 17.64 kg/m  , BMI Body mass index is 17.64 kg/m. GEN:  very thin, in no acute distress  HEENT: normal  Neck: no JVD, carotid bruits, or masses Cardiac: RRR; no murmurs, rubs, or gallops,no edema  Respiratory:   Mildly decreased breath sounds at the bases, normal work of breathing GI: soft, nontender, nondistended, + BS MS: no deformity or atrophy  Skin: warm and dry, no rash Neuro:  Strength and sensation are intact Psych: euthymic mood, full affect    Recent Labs: 07/12/2016: ALT 12; BUN 13; Creatinine, Ser 0.82; Potassium 4.1; Sodium 136; TSH 1.70    Lipid Panel No results found for: CHOL, HDL, LDLCALC, TRIG    Wt Readings from Last 3 Encounters:  08/04/16 116 lb (52.6 kg)  08/03/16 116 lb 12.8 oz (53 kg)  07/26/16 114 lb (51.7 kg)       ASSESSMENT AND PLAN:  Abnormal EKG - Plan: EKG 12-Lead Most recent EKG 2 including EKG today is essentially normal  Coronary artery calcification CT scan 2 reviewed with her in detail today, images pulled up in the office Very minimal coronary calcification noted No symptoms concerning for angina. No further testing ordered at this time We did discuss echocardiography, stress testing in detail  Aortic atherosclerosis (HCC) Mild aortic atherosclerosis descending aorta, mild to moderate in the aortic arch He risk factors, not Connie smoking, no diabetes. No recent lipid panel available but she's had dramatic weight loss  Cough Improved symptoms with prednisone Followed by  pulmonary  IPF (idiopathic pulmonary fibrosis) (HCC)  Bronchiectasis without complication (HCC) Seen on CT scan, symptoms improving after prednisone  Shortness of breath Likely secondary to underlying lung disease We did discuss if symptoms do not improve with pulmonary rehabilitation, we could consider echocardiogram. She would like to wait at this time Pulmonary artery is not dilated on CT scan, less likely pulmonary hypertension   Total encounter time more than 25 minutes  Greater than 50% was spent in counseling and coordination of care with the patient  Disposition:   F/U  as needed   Orders Placed This Encounter  Procedures  . EKG 12-Lead     Signed, Esmond Plants, M.D., Ph.D. 08/04/2016  [place], [place]

## 2016-08-05 ENCOUNTER — Ambulatory Visit: Payer: Medicare Other | Attending: Internal Medicine

## 2016-08-05 DIAGNOSIS — J849 Interstitial pulmonary disease, unspecified: Secondary | ICD-10-CM | POA: Insufficient documentation

## 2016-08-05 MED ORDER — ALBUTEROL SULFATE (2.5 MG/3ML) 0.083% IN NEBU
2.5000 mg | INHALATION_SOLUTION | Freq: Once | RESPIRATORY_TRACT | Status: AC
  Administered 2016-08-05: 2.5 mg via RESPIRATORY_TRACT
  Filled 2016-08-05: qty 3

## 2016-08-10 ENCOUNTER — Encounter: Payer: Medicare Other | Attending: Internal Medicine | Admitting: Respiratory Therapy

## 2016-08-10 VITALS — Ht 68.25 in | Wt 118.2 lb

## 2016-08-10 DIAGNOSIS — J849 Interstitial pulmonary disease, unspecified: Secondary | ICD-10-CM | POA: Insufficient documentation

## 2016-08-10 DIAGNOSIS — J8410 Pulmonary fibrosis, unspecified: Secondary | ICD-10-CM

## 2016-08-10 DIAGNOSIS — J479 Bronchiectasis, uncomplicated: Secondary | ICD-10-CM

## 2016-08-10 NOTE — Progress Notes (Signed)
Pulmonary Individual Treatment Plan  Patient Details  Name: Crystal Gregory MRN: [id number] Date of Birth: June 23, 1932 Referring Provider:   Flowsheet Row Pulmonary Rehab from 08/10/2016 in [facility] Cardiac and Pulmonary Rehab  Referring Provider  Ramaswamy      Initial Encounter Date:  Flowsheet Row Pulmonary Rehab from 08/10/2016 in [facility] Cardiac and Pulmonary Rehab  Date  08/10/16  Referring Provider  Ramaswamy      Visit Diagnosis: Pulmonary fibrosis ([place])  Bronchiectasis without complication ([place])  Patient's Home Medications on Admission:  Current Outpatient Prescriptions:    albuterol (PROVENTIL HFA;VENTOLIN HFA) 108 (90 Base) MCG/ACT inhaler, Inhale 2 puffs into the lungs every 6 (six) hours as needed., Disp: 1 Inhaler, Rfl: 0   AMBULATORY NON FORMULARY MEDICATION, Medication Name: Incentive Spirometry  Use 10-15 times daily, Disp: 1 each, Rfl: 0   Calcium Carbonate-Vit D-Min (CALCIUM 1200 PO), Take by mouth., Disp: , Rfl:    denosumab (PROLIA) 60 MG/ML SOLN injection, Inject 60 mg into the skin every 6 (six) months. Administer in upper arm, thigh, or abdomen, Disp: , Rfl:    nitrofurantoin (MACRODANTIN) 50 MG capsule, Take one by mouth daily, Disp: 90 capsule, Rfl: 0   pantoprazole (PROTONIX) 20 MG tablet, Take 1 tablet (20 mg total) by mouth daily., Disp: 90 tablet, Rfl: 1   propranolol (INDERAL) 10 MG tablet, TAKE 1 TABLET TWICE A DAY, Disp: 180 tablet, Rfl: 1   Respiratory Therapy Supplies (FLUTTER) DEVI, Use 10-15 times daily, Disp: 1 each, Rfl: 0   traZODone (DESYREL) 50 MG tablet, TAKE 1 TABLET AT BEDTIME, Disp: 90 tablet, Rfl: 1  Past Medical History: Past Medical History:  Diagnosis Date   Allergy    GERD (gastroesophageal reflux disease)    Septicemia ([place]) 1999   spent 4 weeks in ICU, intubated -? PNA    Tobacco Use: History  Smoking Status   Former Smoker   Packs/day: 0.50   Years: 30.00   Quit date: 01/16/1981  Smokeless Tobacco   Never  Used    Labs: Recent Review Flowsheet Data    There is no flowsheet data to display.       ADL UCSD:     Pulmonary Assessment Scores    Row Name 08/10/16 1216         ADL UCSD   ADL Phase Entry     SOB Score total 64     Rest 1     Walk 3     Stairs 3     Bath 3     Dress 3     Shop 2        Pulmonary Function Assessment:     Pulmonary Function Assessment - 08/10/16 1214      Pulmonary Function Tests   FVC% 80 %   FEV1% 63 %   FEV1/FVC Ratio 58     Initial Spirometry Results   Comments Test date 06/18/16     Breath   Bilateral Breath Sounds Clear   Shortness of Breath Yes;Limiting activity      Exercise Target Goals: Date: 08/10/16  Exercise Program Goal: Individual exercise prescription set with THRR, safety & activity barriers. Participant demonstrates ability to understand and report RPE using BORG scale, to self-measure pulse accurately, and to acknowledge the importance of the exercise prescription.  Exercise Prescription Goal: Starting with aerobic activity 30 plus minutes a day, 3 days per week for initial exercise prescription. Provide home exercise prescription and guidelines that participant acknowledges understanding prior to discharge.  Activity Barriers & Risk Stratification:     Activity Barriers & Cardiac Risk Stratification - 08/10/16 1213      Activity Barriers & Cardiac Risk Stratification   Activity Barriers Shortness of Breath;Deconditioning   Cardiac Risk Stratification Low      6 Minute Walk:     6 Minute Walk    Row Name 08/10/16 1234         6 Minute Walk   Distance 1094 feet     Walk Time 6 minutes     # of Rest Breaks 0     MPH 2.07     METS 2.53     RPE 11     Perceived Dyspnea  3     VO2 Peak 7.78     Symptoms No     Resting HR 87 bpm     Resting BP 110/56     Max Ex. HR 103 bpm     Max Ex. BP 106/56       Interval HR   Baseline HR 87     1 Minute HR 95     2 Minute HR 101     3 Minute HR 103      4 Minute HR 103     5 Minute HR 103     6 Minute HR 103     2 Minute Post HR 77     Interval Heart Rate? Yes       Interval Oxygen   Interval Oxygen? Yes     Baseline Oxygen Saturation % 97 %     1 Minute Oxygen Saturation % 97 %     2 Minute Oxygen Saturation % 98 %     3 Minute Oxygen Saturation % 98 %     4 Minute Oxygen Saturation % 98 %     5 Minute Oxygen Saturation % 96 %     6 Minute Oxygen Saturation % 98 %     2 Minute Post Oxygen Saturation % 99 %        Initial Exercise Prescription:     Initial Exercise Prescription - 08/10/16 1200      Date of Initial Exercise RX and Referring Provider   Date 08/10/16   Referring Provider Ramaswamy     Treadmill   MPH 1.8   Grade 0.5   Minutes 15   METs 2.5     Recumbant Bike   Level 1   RPM 60   Minutes 15   METs 2     Recumbant Elliptical   Level 1   RPM 50   Minutes 15   METs 2     Elliptical   Level 1   Speed 2.5   Minutes 15   METs 2     Prescription Details   Frequency (times per week) 3   Duration Progress to 45 minutes of aerobic exercise without signs/symptoms of physical distress     Intensity   THRR 40-80% of Max Heartrate 106-125   Ratings of Perceived Exertion 11-15   Perceived Dyspnea 0-4     Progression   Progression Continue to progress workloads to maintain intensity without signs/symptoms of physical distress.     Resistance Training   Training Prescription Yes   Weight 2   Reps 10-12      Perform Capillary Blood Glucose checks as needed.  Exercise Prescription Changes:   Exercise Comments:   Discharge Exercise Prescription (Final Exercise Prescription Changes):  Nutrition:  Target Goals: Understanding of nutrition guidelines, daily intake of sodium 1500mg , cholesterol 200mg , calories 30% from fat and 7% or less from saturated fats, daily to have 5 or more servings of fruits and vegetables.  Biometrics:     Pre Biometrics - 08/10/16 1229      Pre  Biometrics   Height 5' 8.25" (1.734 m)   Weight 118 lb 3.2 oz (53.6 kg)   Waist Circumference 26.75 inches   Hip Circumference 35.25 inches   Waist to Hip Ratio 0.76 %   BMI (Calculated) 17.9       Nutrition Therapy Plan and Nutrition Goals:   Nutrition Discharge: Rate Your Plate Scores:   Psychosocial: Target Goals: Acknowledge presence or absence of depression, maximize coping skills, provide positive support system. Participant is able to verbalize types and ability to use techniques and skills needed for reducing stress and depression.  Initial Review & Psychosocial Screening:     Initial Psych Review & Screening - 08/10/16 [place]? Yes   Comments Ms Knost has good support from her husband and 2 children. They live at [facility] and enjoy the retirement community life. Recently diagnosed with pulmonary fibrosis has been a challenge, and she is looking forward to the [facility] program for disease management skills.     Barriers   Psychosocial barriers to participate in program There are no identifiable barriers or psychosocial needs.;The patient should benefit from training in stress management and relaxation.     Screening Interventions   Interventions Encouraged to exercise      Quality of Life Scores:     Quality of Life - 08/10/16 1231      Quality of Life Scores   Health/Function Pre 20.69 %   Socioeconomic Pre 20.83 %   Psych/Spiritual Pre 20.71 %   Family Pre 21 %   GLOBAL Pre 20.76 %      PHQ-9: Recent Review Flowsheet Data    Depression screen [facility] 2/9 08/10/2016   Decreased Interest 1   Down, Depressed, Hopeless 0   PHQ - 2 Score 1   Altered sleeping 1   Tired, decreased energy 3   Change in appetite 0   Feeling bad or failure about yourself  0   Trouble concentrating 0   Moving slowly or fidgety/restless 0   Suicidal thoughts 0   PHQ-9 Score 5      Psychosocial Evaluation and  Intervention:   Psychosocial Re-Evaluation:  Education: Education Goals: Education classes will be provided on a weekly basis, covering required topics. Participant will state understanding/return demonstration of topics presented.  Learning Barriers/Preferences:     Learning Barriers/Preferences - 08/10/16 1214      Learning Barriers/Preferences   Learning Barriers None   Learning Preferences None      Education Topics: Initial Evaluation Education: - Verbal, written and demonstration of respiratory meds, RPE/PD scales, oximetry and breathing techniques. Instruction on use of nebulizers and MDIs: cleaning and proper use, rinsing mouth with steroid doses and importance of monitoring MDI activations. Flowsheet Row Pulmonary Rehab from 08/10/2016 in [facility] Cardiac and Pulmonary Rehab  Date  08/10/16  Educator  LB  Instruction Review Code  2- meets goals/outcomes      General Nutrition Guidelines/Fats and Fiber: -Group instruction provided by verbal, written material, models and posters to present the general guidelines for heart healthy nutrition. Gives an explanation and review of dietary fats and fiber.  Controlling Sodium/Reading Food Labels: -Group verbal and written material supporting the discussion of sodium use in heart healthy nutrition. Review and explanation with models, verbal and written materials for utilization of the food label.   Exercise Physiology & Risk Factors: - Group verbal and written instruction with models to review the exercise physiology of the cardiovascular system and associated critical values. Details cardiovascular disease risk factors and the goals associated with each risk factor.   Aerobic Exercise & Resistance Training: - Gives group verbal and written discussion on the health impact of inactivity. On the components of aerobic and resistive training programs and the benefits of this training and how to safely progress through these  programs.   Flexibility, Balance, General Exercise Guidelines: - Provides group verbal and written instruction on the benefits of flexibility and balance training programs. Provides general exercise guidelines with specific guidelines to those with heart or lung disease. Demonstration and skill practice provided.   Stress Management: - Provides group verbal and written instruction about the health risks of elevated stress, cause of high stress, and healthy ways to reduce stress.   Depression: - Provides group verbal and written instruction on the correlation between heart/lung disease and depressed mood, treatment options, and the stigmas associated with seeking treatment.   Exercise & Equipment Safety: - Individual verbal instruction and demonstration of equipment use and safety with use of the equipment.   Infection Prevention: - Provides verbal and written material to individual with discussion of infection control including proper hand washing and proper equipment cleaning during exercise session. Flowsheet Row Pulmonary Rehab from 08/10/2016 in [facility] Cardiac and Pulmonary Rehab  Date  08/10/16  Educator  LB  Instruction Review Code  2- meets goals/outcomes      Falls Prevention: - Provides verbal and written material to individual with discussion of falls prevention and safety. Flowsheet Row Pulmonary Rehab from 08/10/2016 in [facility] Cardiac and Pulmonary Rehab  Date  08/10/16  Educator  LB  Instruction Review Code  2- meets goals/outcomes      Diabetes: - Individual verbal and written instruction to review signs/symptoms of diabetes, desired ranges of glucose level fasting, after meals and with exercise. Advice that pre and post exercise glucose checks will be done for 3 sessions at entry of program.   Chronic Lung Diseases: - Group verbal and written instruction to review new updates, new respiratory medications, new advancements in procedures and treatments. Provide  informative websites and "800" numbers of self-education.   Lung Procedures: - Group verbal and written instruction to describe testing methods done to diagnose lung disease. Review the outcome of test results. Describe the treatment choices: Pulmonary Function Tests, ABGs and oximetry.   Energy Conservation: - Provide group verbal and written instruction for methods to conserve energy, plan and organize activities. Instruct on pacing techniques, use of adaptive equipment and posture/positioning to relieve shortness of breath.   Triggers: - Group verbal and written instruction to review types of environmental controls: home humidity, furnaces, filters, dust mite/pet prevention, HEPA vacuums. To discuss weather changes, air quality and the benefits of nasal washing.   Exacerbations: - Group verbal and written instruction to provide: warning signs, infection symptoms, calling MD promptly, preventive modes, and value of vaccinations. Review: effective airway clearance, coughing and/or vibration techniques. Create an Sports administrator.   Oxygen: - Individual and group verbal and written instruction on oxygen therapy. Includes supplement oxygen, available portable oxygen systems, continuous and intermittent flow rates, oxygen safety, concentrators, and Medicare reimbursement for oxygen.  Respiratory Medications: - Group verbal and written instruction to review medications for lung disease. Drug class, frequency, complications, importance of spacers, rinsing mouth after steroid MDI's, and proper cleaning methods for nebulizers. Flowsheet Row Pulmonary Rehab from 08/10/2016 in [facility] Cardiac and Pulmonary Rehab  Date  08/10/16  Educator  LB  Instruction Review Code  2- meets goals/outcomes      AED/CPR: - Group verbal and written instruction with the use of models to demonstrate the basic use of the AED with the basic ABC's of resuscitation.   Breathing Retraining: - Provides individuals verbal  and written instruction on purpose, frequency, and proper technique of diaphragmatic breathing and pursed-lipped breathing. Applies individual practice skills. Flowsheet Row Pulmonary Rehab from 08/10/2016 in [facility] Cardiac and Pulmonary Rehab  Date  08/10/16  Educator  LB  Instruction Review Code  2- meets goals/outcomes      Anatomy and Physiology of the Lungs: - Group verbal and written instruction with the use of models to provide basic lung anatomy and physiology related to function, structure and complications of lung disease.   Heart Failure: - Group verbal and written instruction on the basics of heart failure: signs/symptoms, treatments, explanation of ejection fraction, enlarged heart and cardiomyopathy.   Sleep Apnea: - Individual verbal and written instruction to review Obstructive Sleep Apnea. Review of risk factors, methods for diagnosing and types of masks and machines for OSA.   Anxiety: - Provides group, verbal and written instruction on the correlation between heart/lung disease and anxiety, treatment options, and management of anxiety.   Relaxation: - Provides group, verbal and written instruction about the benefits of relaxation for patients with heart/lung disease. Also provides patients with examples of relaxation techniques.   Knowledge Questionnaire Score:     Knowledge Questionnaire Score - 08/10/16 1214      Knowledge Questionnaire Score   Pre Score 9/10       Core Components/Risk Factors/Patient Goals at Admission:     Personal Goals and Risk Factors at Admission - 08/10/16 1231      Core Components/Risk Factors/Patient Goals on Admission   Intervention --  Ms Desantiago would like to meet with the dietitian. She watches her diet and adds protein. Her BMI is 17.9 and normal range for her would be 18-24.   Goal Weight: Long Term 130 lb (59 kg)      Core Components/Risk Factors/Patient Goals Review:    Core Components/Risk Factors/Patient Goals  at Discharge (Final Review):    ITP Comments:   Comments: Ms Shook plans to start 08/16/16 and attend 3 days/week.

## 2016-08-10 NOTE — Patient Instructions (Signed)
Patient Instructions  Patient Details  Name: Serita Silver MRN: [id number] Date of Birth: Jan 01, 1932 Referring Provider:  Brand Males, MD  Below are the personal goals you chose as well as exercise and nutrition goals. Our goal is to help you keep on track towards obtaining and maintaining your goals. We will be discussing your progress on these goals with you throughout the program.  Initial Exercise Prescription:     Initial Exercise Prescription - 08/10/16 1200      Date of Initial Exercise RX and Referring Provider   Date 08/10/16   Referring Provider Ramaswamy     Treadmill   MPH 1.8   Grade 0.5   Minutes 15   METs 2.5     Recumbant Bike   Level 1   RPM 60   Minutes 15   METs 2     Recumbant Elliptical   Level 1   RPM 50   Minutes 15   METs 2     Elliptical   Level 1   Speed 2.5   Minutes 15   METs 2     Prescription Details   Frequency (times per week) 3   Duration Progress to 45 minutes of aerobic exercise without signs/symptoms of physical distress     Intensity   THRR 40-80% of Max Heartrate 106-125   Ratings of Perceived Exertion 11-15   Perceived Dyspnea 0-4     Progression   Progression Continue to progress workloads to maintain intensity without signs/symptoms of physical distress.     Resistance Training   Training Prescription Yes   Weight 2   Reps 10-12      Exercise Goals: Frequency: Be able to perform aerobic exercise three times per week working toward 3-5 days per week.  Intensity: Work with a perceived exertion of 11 (fairly light) - 15 (hard) as tolerated. Follow your new exercise prescription and watch for changes in prescription as you progress with the program. Changes will be reviewed with you when they are made.  Duration: You should be able to do 30 minutes of continuous aerobic exercise in addition to a 5 minute warm-up and a 5 minute cool-down routine.  Nutrition Goals: Your personal nutrition goals will be  established when you do your nutrition analysis with the dietician.  The following are nutrition guidelines to follow: Cholesterol < 200mg /day Sodium < 1500mg /day Fiber: Women over 50 yrs - 21 grams per day  Personal Goals:     Personal Goals and Risk Factors at Admission - 08/10/16 1231      Core Components/Risk Factors/Patient Goals on Admission   Intervention --  Ms Kelsall would like to meet with the dietitian. She watches her diet and adds protein. Her BMI is 17.9 and normal range for her would be 18-24.   Goal Weight: Long Term 130 lb (59 kg)      Tobacco Use Initial Evaluation: History  Smoking Status   Former Smoker   Packs/day: 0.50   Years: 30.00   Quit date: 01/16/1981  Smokeless Tobacco   Never Used    Copy of goals given to participant.

## 2016-08-16 ENCOUNTER — Encounter: Payer: Medicare Other | Admitting: *Deleted

## 2016-08-16 DIAGNOSIS — J479 Bronchiectasis, uncomplicated: Secondary | ICD-10-CM

## 2016-08-16 DIAGNOSIS — J849 Interstitial pulmonary disease, unspecified: Secondary | ICD-10-CM | POA: Diagnosis not present

## 2016-08-16 DIAGNOSIS — J8410 Pulmonary fibrosis, unspecified: Secondary | ICD-10-CM

## 2016-08-16 NOTE — Progress Notes (Signed)
Daily Session Note  Patient Details  Name: Crystal Gregory MRN: [id number] Date of Birth: [birth year] Referring Provider:   Flowsheet Row Pulmonary Rehab from 08/10/2016 in [facility] Card[facility] Pulmonary Rehab  Referring Provider  Ramaswamy      Encounter Date: 08/16/2016  Check In:     Session Check In - 08/16/16 1237      Check-In   Location [facility]-Cardiac & Pulmonary Rehab   Staff Present Gerlene Burdock, RN, Moises Blood, BS, ACSM CEP, Exercise Physiologist;Amanda Oletta Darter, [place], ACSM CEP, Exercise Physiologist   Supervising physician immediately available to respond to emergencies [facility] immediately available ER MD   Physician(s) Cinda Quest and Joni Fears   Medication changes reported     No   Fall or balance concerns reported    No   Warm-up and Cool-down Performed on first and last piece of equipment   Resistance Training Performed Yes   VAD Patient? No     Pain Assessment   Currently in Pain? No/denies   Multiple Pain Sites No           Exercise Prescription Changes - 08/16/16 1200      Response to Exercise   Blood Pressure (Admit) 126/60   Blood Pressure (Exercise) 126/58   Blood Pressure (Exit) 110/64   Heart Rate (Admit) 78 bpm   Heart Rate (Exercise) 109 bpm   Heart Rate (Exit) 74 bpm   Oxygen Saturation (Admit) 97 %   Oxygen Saturation (Exercise) 94 %   Oxygen Saturation (Exit) 98 %   Rating of Perceived Exertion (Exercise) 13   Perceived Dyspnea (Exercise) 3     Resistance Training   Training Prescription Yes   Weight 2   Reps 10-12     Treadmill   MPH 1.8   Grade 0.5   Minutes 15   METs 2.5     Recumbant Bike   Level 1   RPM 60   Minutes 15   METs 2     Recumbant Elliptical   Level 1   RPM 50   Minutes 15   METs 2      Goals Met:  Proper associated with RPD/PD & O2 Sat Exercise tolerated well Personal goals reviewed Strength training completed today  Goals Unmet:  Not Applicable  Comments: First full day of exercise!  Patient  was oriented to gym and equipment including functions, settings, policies, and procedures.  Patient's individual exercise prescription and treatment plan were reviewed.  All starting workloads were established based on the results of the 6 minute walk test done at initial orientation visit.  The plan for exercise progression was also introduced and progression will be customized based on patient's performance and goals.    Dr. Emily Filbert is Medical Director for [place] and [facility].

## 2016-08-18 ENCOUNTER — Encounter: Payer: Medicare Other | Admitting: *Deleted

## 2016-08-18 DIAGNOSIS — J8410 Pulmonary fibrosis, unspecified: Secondary | ICD-10-CM

## 2016-08-18 DIAGNOSIS — J849 Interstitial pulmonary disease, unspecified: Secondary | ICD-10-CM | POA: Diagnosis not present

## 2016-08-18 DIAGNOSIS — J479 Bronchiectasis, uncomplicated: Secondary | ICD-10-CM

## 2016-08-18 LAB — ACID FAST CULTURE WITH REFLEXED SENSITIVITIES: ACID FAST CULTURE - AFSCU3: NEGATIVE

## 2016-08-18 NOTE — Progress Notes (Signed)
Daily Session Note  Patient Details  Name: Crystal Gregory MRN: [id number] Date of Birth: June 15, 1932 Referring Provider:   Flowsheet Row Pulmonary Rehab from 08/10/2016 in [facility] Cardiac and Pulmonary Rehab  Referring Provider  Ramaswamy      Encounter Date: 08/18/2016  Check In:     Session Check In - 08/18/16 1237      Check-In   Location [facility]-Cardiac & Pulmonary Rehab   Staff Present Carson Myrtle, BS, RRT, Respiratory Lennie Hummer, MA, ACSM RCEP, Exercise Physiologist;Amanda Oletta Darter, BA, ACSM CEP, Exercise Physiologist;Other   Supervising physician immediately available to respond to emergencies [facility] immediately available ER MD   Physician(s) Drs. Alfred Levins and [place]   Medication changes reported     No   Fall or balance concerns reported    No   Warm-up and Cool-down Performed as group-led Location manager Performed Yes   VAD Patient? No     Pain Assessment   Currently in Pain? No/denies   Multiple Pain Sites No         Goals Met:  Proper associated with RPD/PD & O2 Sat Independence with exercise equipment Using PLB without cueing & demonstrates good technique Exercise tolerated well No report of cardiac concerns or symptoms Strength training completed today  Goals Unmet:  Not Applicable  Comments: Pt able to follow exercise prescription today without complaint.  Will continue to monitor for progression.    Dr. Emily Filbert is Medical Director for [place] and [facility].

## 2016-08-20 ENCOUNTER — Encounter: Payer: Medicare Other | Admitting: *Deleted

## 2016-08-20 DIAGNOSIS — J8410 Pulmonary fibrosis, unspecified: Secondary | ICD-10-CM

## 2016-08-20 DIAGNOSIS — J849 Interstitial pulmonary disease, unspecified: Secondary | ICD-10-CM | POA: Diagnosis not present

## 2016-08-20 NOTE — Progress Notes (Signed)
Daily Session Note  Patient Details  Name: Crystal Gregory MRN: [id number] Date of Birth: [birth year] Referring Provider:   Flowsheet Row Pulmonary Rehab from 08/10/2016 in [facility] Cardiac and Pulmonary Rehab  Referring Provider  Ramaswamy      Encounter Date: 08/20/2016  Check In:     Session Check In - 08/20/16 1054      Check-In   Location [facility]-Cardiac & Pulmonary Rehab   Staff Present Nyoka Cowden, RN, BSN, MA;Keeshia Sanderlin, RN, Vickki Hearing, BA, ACSM CEP, Exercise Physiologist   Supervising physician immediately available to respond to emergencies [facility] immediately available ER MD   Physician(s) Dr. Jimmye Norman and Dr. Jacqualine Code   Medication changes reported     No   Fall or balance concerns reported    No   Warm-up and Cool-down Performed as group-led instruction   Resistance Training Performed Yes   VAD Patient? No     Pain Assessment   Currently in Pain? No/denies         Goals Met:  Proper associated with RPD/PD & O2 Sat Exercise tolerated well  Goals Unmet:  Not Applicable  Comments:     Dr. Emily Filbert is Medical Director for [place] and [facility].

## 2016-08-23 ENCOUNTER — Encounter: Payer: Medicare Other | Admitting: *Deleted

## 2016-08-23 DIAGNOSIS — J8410 Pulmonary fibrosis, unspecified: Secondary | ICD-10-CM

## 2016-08-23 DIAGNOSIS — J849 Interstitial pulmonary disease, unspecified: Secondary | ICD-10-CM | POA: Diagnosis not present

## 2016-08-23 DIAGNOSIS — J479 Bronchiectasis, uncomplicated: Secondary | ICD-10-CM

## 2016-08-23 NOTE — Progress Notes (Signed)
Daily Session Note  Patient Details  Name: Happy Begeman MRN: [id number] Date of Birth: [birth year] Referring Provider:   Flowsheet Row Pulmonary Rehab from 08/10/2016 in [facility] Cardiac and Pulmonary Rehab  Referring Provider  Ramaswamy      Encounter Date: 08/23/2016  Check In:     Session Check In - 08/23/16 1141      Check-In   Location [facility]-Cardiac & Pulmonary Rehab   Staff Present Carson Myrtle, BS, RRT, Respiratory Bertis Ruddy, BS, ACSM CEP, Exercise Physiologist;Amanda Oletta Darter, BA, ACSM CEP, Exercise Physiologist   Supervising physician immediately available to respond to emergencies [facility] immediately available ER MD   Physician(s) Drs. Malinda and [place]   Medication changes reported     No   Fall or balance concerns reported    No   Warm-up and Cool-down Performed on first and last piece of equipment   Resistance Training Performed Yes   VAD Patient? No     Pain Assessment   Currently in Pain? No/denies   Multiple Pain Sites No         Goals Met:  Proper associated with RPD/PD & O2 Sat Independence with exercise equipment Exercise tolerated well Personal goals reviewed Strength training completed today  Goals Unmet:  Not Applicable  Comments: Pt able to follow exercise prescription today without complaint.  Will continue to monitor for progression.    Dr. Emily Filbert is Medical Director for [place] and [facility].

## 2016-08-24 ENCOUNTER — Telehealth: Payer: Self-pay | Admitting: Family Medicine

## 2016-08-24 NOTE — Telephone Encounter (Signed)
Left message with pt spouse. He will have pt call back and schedule AWV + labs with Lesia and OV 30 with PCP.

## 2016-08-30 ENCOUNTER — Encounter: Payer: Self-pay | Admitting: Respiratory Therapy

## 2016-08-30 ENCOUNTER — Encounter: Payer: Medicare Other | Admitting: *Deleted

## 2016-08-30 DIAGNOSIS — J479 Bronchiectasis, uncomplicated: Secondary | ICD-10-CM

## 2016-08-30 DIAGNOSIS — J849 Interstitial pulmonary disease, unspecified: Secondary | ICD-10-CM | POA: Diagnosis not present

## 2016-08-30 DIAGNOSIS — J8410 Pulmonary fibrosis, unspecified: Secondary | ICD-10-CM

## 2016-08-30 NOTE — Progress Notes (Signed)
Daily Session Note  Patient Details  Name: Crystal Gregory MRN: [id number] Date of Birth: 31-Oct-1931 Referring Provider:   Flowsheet Row Pulmonary Rehab from 08/10/2016 in [facility] Cardiac and Pulmonary Rehab  Referring Provider  Ramaswamy      Encounter Date: 08/30/2016  Check In:     Session Check In - 08/30/16 1105      Check-In   Location [facility]-Cardiac & Pulmonary Rehab   Staff Present Carson Myrtle, BS, RRT, Respiratory Therapist;Denali Becvar Amedeo Plenty, BS, ACSM CEP, Exercise Physiologist;Amanda Oletta Darter, BA, ACSM CEP, Exercise Physiologist   Supervising physician immediately available to respond to emergencies [facility] immediately available ER MD   Physician(s) Drs. Joni Fears and [place]   Medication changes reported     No   Fall or balance concerns reported    No   Warm-up and Cool-down Performed on first and last piece of equipment   Resistance Training Performed Yes   VAD Patient? No     Pain Assessment   Currently in Pain? No/denies   Multiple Pain Sites No         Goals Met:  Proper associated with RPD/PD & O2 Sat Independence with exercise equipment Exercise tolerated well Strength training completed today  Goals Unmet:  Not Applicable  Comments: Pt able to follow exercise prescription today without complaint.  Will continue to monitor for progression.    Dr. Emily Filbert is Medical Director for [place] and [facility].

## 2016-08-30 NOTE — Progress Notes (Signed)
Pulmonary Individual Treatment Plan  Patient Details  Name: Crystal Gregory MRN: [id number] Date of Birth: [birth year] Referring Provider:   Flowsheet Row Pulmonary Rehab from 08/10/2016 in [facility] Cardiac and Pulmonary Rehab  Referring Provider  Ramaswamy      Initial Encounter Date:  Flowsheet Row Pulmonary Rehab from 08/10/2016 in [facility] Cardiac and Pulmonary Rehab  Date  08/10/16  Referring Provider  Ramaswamy      Visit Diagnosis: Pulmonary fibrosis ([place])  Bronchiectasis without complication ([place])  Patient's Home Medications on Admission:  Current Outpatient Prescriptions:    albuterol (PROVENTIL HFA;VENTOLIN HFA) 108 (90 Base) MCG/ACT inhaler, Inhale 2 puffs into the lungs every 6 (six) hours as needed., Disp: 1 Inhaler, Rfl: 0   AMBULATORY NON FORMULARY MEDICATION, Medication Name: Incentive Spirometry  Use 10-15 times daily, Disp: 1 each, Rfl: 0   Calcium Carbonate-Vit D-Min (CALCIUM 1200 PO), Take by mouth., Disp: , Rfl:    denosumab (PROLIA) 60 MG/ML SOLN injection, Inject 60 mg into the skin every 6 (six) months. Administer in upper arm, thigh, or abdomen, Disp: , Rfl:    nitrofurantoin (MACRODANTIN) 50 MG capsule, Take one by mouth daily, Disp: 90 capsule, Rfl: 0   pantoprazole (PROTONIX) 20 MG tablet, Take 1 tablet (20 mg total) by mouth daily., Disp: 90 tablet, Rfl: 1   propranolol (INDERAL) 10 MG tablet, TAKE 1 TABLET TWICE A DAY, Disp: 180 tablet, Rfl: 1   Respiratory Therapy Supplies (FLUTTER) DEVI, Use 10-15 times daily, Disp: 1 each, Rfl: 0   traZODone (DESYREL) 50 MG tablet, TAKE 1 TABLET AT BEDTIME, Disp: 90 tablet, Rfl: 1  Past Medical History: Past Medical History:  Diagnosis Date   Allergy    GERD (gastroesophageal reflux disease)    Septicemia ([place]) 1999   spent 4 weeks in ICU, intubated -? PNA    Tobacco Use: History  Smoking Status   Former Smoker   Packs/day: 0.50   Years: 30.00   Quit date: 01/16/1981  Smokeless Tobacco   Never  Used    Labs: Recent Review Flowsheet Data    There is no flowsheet data to display.       ADL UCSD:     Pulmonary Assessment Scores    Row Name 08/10/16 1216         ADL UCSD   ADL Phase Entry     SOB Score total 64     Rest 1     Walk 3     Stairs 3     Bath 3     Dress 3     Shop 2        Pulmonary Function Assessment:     Pulmonary Function Assessment - 08/10/16 1214      Pulmonary Function Tests   FVC% 80 %   FEV1% 63 %   FEV1/FVC Ratio 58     Initial Spirometry Results   Comments Test date 06/18/16     Breath   Bilateral Breath Sounds Clear   Shortness of Breath Yes;Limiting activity      Exercise Target Goals:    Exercise Program Goal: Individual exercise prescription set with THRR, safety & activity barriers. Participant demonstrates ability to understand and report RPE using BORG scale, to self-measure pulse accurately, and to acknowledge the importance of the exercise prescription.  Exercise Prescription Goal: Starting with aerobic activity 30 plus minutes a day, 3 days per week for initial exercise prescription. Provide home exercise prescription and guidelines that participant acknowledges understanding prior to discharge.  Activity Barriers & Risk Stratification:     Activity Barriers & Cardiac Risk Stratification - 08/10/16 1213      Activity Barriers & Cardiac Risk Stratification   Activity Barriers Shortness of Breath;Deconditioning   Cardiac Risk Stratification Low      6 Minute Walk:     6 Minute Walk    Row Name 08/10/16 1234         6 Minute Walk   Distance 1094 feet     Walk Time 6 minutes     # of Rest Breaks 0     MPH 2.07     METS 2.53     RPE 11     Perceived Dyspnea  3     VO2 Peak 7.78     Symptoms No     Resting HR 87 bpm     Resting BP 110/56     Max Ex. HR 103 bpm     Max Ex. BP 106/56       Interval HR   Baseline HR 87     1 Minute HR 95     2 Minute HR 101     3 Minute HR 103     4 Minute  HR 103     5 Minute HR 103     6 Minute HR 103     2 Minute Post HR 77     Interval Heart Rate? Yes       Interval Oxygen   Interval Oxygen? Yes     Baseline Oxygen Saturation % 97 %     1 Minute Oxygen Saturation % 97 %     2 Minute Oxygen Saturation % 98 %     3 Minute Oxygen Saturation % 98 %     4 Minute Oxygen Saturation % 98 %     5 Minute Oxygen Saturation % 96 %     6 Minute Oxygen Saturation % 98 %     2 Minute Post Oxygen Saturation % 99 %        Initial Exercise Prescription:     Initial Exercise Prescription - 08/10/16 1200      Date of Initial Exercise RX and Referring Provider   Date 08/10/16   Referring Provider Ramaswamy     Treadmill   MPH 1.8   Grade 0.5   Minutes 15   METs 2.5     Recumbant Bike   Level 1   RPM 60   Minutes 15   METs 2     Recumbant Elliptical   Level 1   RPM 50   Minutes 15   METs 2     Elliptical   Level 1   Speed 2.5   Minutes 15   METs 2     Prescription Details   Frequency (times per week) 3   Duration Progress to 45 minutes of aerobic exercise without signs/symptoms of physical distress     Intensity   THRR 40-80% of Max Heartrate 106-125   Ratings of Perceived Exertion 11-15   Perceived Dyspnea 0-4     Progression   Progression Continue to progress workloads to maintain intensity without signs/symptoms of physical distress.     Resistance Training   Training Prescription Yes   Weight 2   Reps 10-12      Perform Capillary Blood Glucose checks as needed.  Exercise Prescription Changes:     Exercise Prescription Changes    Row Name 08/16/16 1200 08/23/16 1200  08/24/16 1500         Exercise Review   Progression  --  -- Yes       Response to Exercise   Blood Pressure (Admit) 126/60  -- 114/60     Blood Pressure (Exercise) 126/58  -- 124/82     Blood Pressure (Exit) 110/64  -- 122/70     Heart Rate (Admit) 78 bpm  -- 88 bpm     Heart Rate (Exercise) 109 bpm  -- 106 bpm     Heart Rate (Exit)  74 bpm  -- 79 bpm     Oxygen Saturation (Admit) 97 %  -- 96 %     Oxygen Saturation (Exercise) 94 %  -- 95 %     Oxygen Saturation (Exit) 98 %  -- 98 %     Rating of Perceived Exertion (Exercise) 13  -- 13     Perceived Dyspnea (Exercise) 3  -- 3     Duration  --  -- Progress to 45 minutes of aerobic exercise without signs/symptoms of physical distress     Intensity  --  -- THRR unchanged       Resistance Training   Training Prescription Yes  -- Yes     Weight 2  -- 2     Reps 10-12  -- 10-12       Interval Training   Interval Training  --  -- No       Treadmill   MPH 1.8  -- 1.8     Grade 0.5  -- 0.5     Minutes 15  -- 15     METs 2.5  -- 2.5       Recumbant Bike   Level 1  --  --     RPM 60  --  --     Minutes 15  --  --     METs 2  --  --       NuStep   Level  --  -- 1     Minutes  --  -- 15       Recumbant Elliptical   Level 1  --  --     RPM 50  --  --     Minutes 15  --  --     METs 2  --  --       REL-XR   Level  --  -- 1     Minutes  --  -- 15       Home Exercise Plan   Plans to continue exercise at  -- Home  --     Frequency  -- Add 1 additional day to program exercise sessions.  --        Exercise Comments:     Exercise Comments    Row Name 08/16/16 1250 08/23/16 1253 08/24/16 1529       Exercise Comments First full day of exercise!  Patient was oriented to gym and equipment including functions, settings, policies, and procedures.  Patient's individual exercise prescription and treatment plan were reviewed.  All starting workloads were established based on the results of the 6 minute walk test done at initial orientation visit.  The plan for exercise progression was also introduced and progression will be customized based on patient's performance and goals. Shambria likes the warm up and strength exercises.  Home exercise was explained and she verbalized understanding of HR, RPE and safety. Hildagard is progressing well in her first  week of exercise.         Discharge Exercise Prescription (Final Exercise Prescription Changes):     Exercise Prescription Changes - 08/24/16 1500      Exercise Review   Progression Yes     Response to Exercise   Blood Pressure (Admit) 114/60   Blood Pressure (Exercise) 124/82   Blood Pressure (Exit) 122/70   Heart Rate (Admit) 88 bpm   Heart Rate (Exercise) 106 bpm   Heart Rate (Exit) 79 bpm   Oxygen Saturation (Admit) 96 %   Oxygen Saturation (Exercise) 95 %   Oxygen Saturation (Exit) 98 %   Rating of Perceived Exertion (Exercise) 13   Perceived Dyspnea (Exercise) 3   Duration Progress to 45 minutes of aerobic exercise without signs/symptoms of physical distress   Intensity THRR unchanged     Resistance Training   Training Prescription Yes   Weight 2   Reps 10-12     Interval Training   Interval Training No     Treadmill   MPH 1.8   Grade 0.5   Minutes 15   METs 2.5     NuStep   Level 1   Minutes 15     REL-XR   Level 1   Minutes 15       Nutrition:  Target Goals: Understanding of nutrition guidelines, daily intake of sodium <153m, cholesterol <2052m calories 30% from fat and 7% or less from saturated fats, daily to have 5 or more servings of fruits and vegetables.  Biometrics:     Pre Biometrics - 08/10/16 1229      Pre Biometrics   Height 5' 8.25" (1.734 m)   Weight 118 lb 3.2 oz (53.6 kg)   Waist Circumference 26.75 inches   Hip Circumference 35.25 inches   Waist to Hip Ratio 0.76 %   BMI (Calculated) 17.9       Nutrition Therapy Plan and Nutrition Goals:   Nutrition Discharge: Rate Your Plate Scores:   Psychosocial: Target Goals: Acknowledge presence or absence of depression, maximize coping skills, provide positive support system. Participant is able to verbalize types and ability to use techniques and skills needed for reducing stress and depression.  Initial Review & Psychosocial Screening:     Initial Psych Review & Screening - 08/10/16 12[place]unt[place]Yes   Comments Ms WrGalambosas good support from her husband and 2 children. [facility] [place]. Recently diagnosed with pulmonary fibrosis has been a challenge, and she is looking forward to the [facility] [facility] for disease management skills.     Barriers   Psychosocial barriers to participate in program There are no identifiable barriers or psychosocial needs.;The patient should benefit from training in stress management and relaxation.     Screening Interventions   Interventions Encouraged to exercise      Quality of Life Scores:     Quality of Life - 08/10/16 1231      Quality of Life Scores   Health/Function Pre 20.69 %   Socioeconomic Pre 20.83 %   Psych/Spiritual Pre 20.71 %   Family Pre 21 %   GLOBAL Pre 20.76 %      PHQ-9: Recent Review Flowsheet Data    [facility] 08/10/2016   Decreased Interest 1   Down, Depressed, Hopeless 0   PHQ - 2 Score 1   Altered sleeping 1   Tired,  decreased energy 3   Change in appetite 0   Feeling bad or failure about yourself  0   Trouble concentrating 0   Moving slowly or fidgety/restless 0   Suicidal thoughts 0   PHQ-9 Score 5      Psychosocial Evaluation and Intervention:     Psychosocial Evaluation - 08/16/16 1016      Psychosocial Evaluation & Interventions   Interventions Encouraged to exercise with the program and follow exercise prescription   Comments Counselor met with Ms. Abruzzese today for initial psychosocial evaluation.  She is an 82 year old who has IPF and Bronchiectasis.  She has a strong support system with a spouse of 53 years and (2) adult children.  She also lives in a retirement community.  Ms. Kappes states she sleeps fine and her appetite has improved recently.  She reports a history of anxiety in the early 1970's subsequent to her spouse being in the [place] and her having two small children.  She  denies any current symptoms of depression or anxiety and states she is typically in a positive mood.  Other than her health she states there is minimal stress in her life.  She has goals to increase her stamina and strength and be able to walk and stand without as much shortness of breath.  Ms. Baize plans to more consistently exercise following this program by walking, which she enjoys, and working out in the retirement [employer] (which she does not enjoy!).  Staff will follow with Ms. Ruberg throughout the course of this program.        Psychosocial Re-Evaluation:  Education: Education Goals: Education classes will be provided on a weekly basis, covering required topics. Participant will state understanding/return demonstration of topics presented.  Learning Barriers/Preferences:     Learning Barriers/Preferences - 08/10/16 1214      Learning Barriers/Preferences   Learning Barriers None   Learning Preferences None      Education Topics: Initial Evaluation Education: - Verbal, written and demonstration of respiratory meds, RPE/PD scales, oximetry and breathing techniques. Instruction on use of nebulizers and MDIs: cleaning and proper use, rinsing mouth with steroid doses and importance of monitoring MDI activations. Flowsheet Row Pulmonary Rehab from 08/23/2016 in [facility] Cardiac and Pulmonary Rehab  Date  08/10/16  Educator  LB  Instruction Review Code  2- meets goals/outcomes      General Nutrition Guidelines/Fats and Fiber: -Group instruction provided by verbal, written material, models and posters to present the general guidelines for heart healthy nutrition. Gives an explanation and review of dietary fats and fiber. Flowsheet Row Pulmonary Rehab from 08/23/2016 in [facility] Cardiac and Pulmonary Rehab  Date  08/23/16  Educator  CR  Instruction Review Code  2- meets goals/outcomes      Controlling Sodium/Reading Food Labels: -Group verbal and written material supporting the  discussion of sodium use in heart healthy nutrition. Review and explanation with models, verbal and written materials for utilization of the food label.   Exercise Physiology & Risk Factors: - Group verbal and written instruction with models to review the exercise physiology of the cardiovascular system and associated critical values. Details cardiovascular disease risk factors and the goals associated with each risk factor. Flowsheet Row Pulmonary Rehab from 08/23/2016 in [facility] Cardiac and Pulmonary Rehab  Date  08/18/16  Educator  AS  Instruction Review Code  2- meets goals/outcomes      Aerobic Exercise & Resistance Training: - Gives group verbal and written discussion on  the health impact of inactivity. On the components of aerobic and resistive training programs and the benefits of this training and how to safely progress through these programs.   Flexibility, Balance, General Exercise Guidelines: - Provides group verbal and written instruction on the benefits of flexibility and balance training programs. Provides general exercise guidelines with specific guidelines to those with heart or lung disease. Demonstration and skill practice provided.   Stress Management: - Provides group verbal and written instruction about the health risks of elevated stress, cause of high stress, and healthy ways to reduce stress.   Depression: - Provides group verbal and written instruction on the correlation between heart/lung disease and depressed mood, treatment options, and the stigmas associated with seeking treatment.   Exercise & Equipment Safety: - Individual verbal instruction and demonstration of equipment use and safety with use of the equipment. Flowsheet Row Pulmonary Rehab from 08/23/2016 in [facility] Cardiac and Pulmonary Rehab  Date  08/16/16  Educator  AS  Instruction Review Code  2- meets goals/outcomes      Infection Prevention: - Provides verbal and written material to individual  with discussion of infection control including proper hand washing and proper equipment cleaning during exercise session. Flowsheet Row Pulmonary Rehab from 08/23/2016 in [facility] Cardiac and Pulmonary Rehab  Date  08/16/16  Educator  AS  Instruction Review Code  2- meets goals/outcomes      Falls Prevention: - Provides verbal and written material to individual with discussion of falls prevention and safety. Flowsheet Row Pulmonary Rehab from 08/23/2016 in [facility] Cardiac and Pulmonary Rehab  Date  08/10/16  Educator  LB  Instruction Review Code  2- meets goals/outcomes      Diabetes: - Individual verbal and written instruction to review signs/symptoms of diabetes, desired ranges of glucose level fasting, after meals and with exercise. Advice that pre and post exercise glucose checks will be done for 3 sessions at entry of program.   Chronic Lung Diseases: - Group verbal and written instruction to review new updates, new respiratory medications, new advancements in procedures and treatments. Provide informative websites and "800" numbers of self-education.   Lung Procedures: - Group verbal and written instruction to describe testing methods done to diagnose lung disease. Review the outcome of test results. Describe the treatment choices: Pulmonary Function Tests, ABGs and oximetry.   Energy Conservation: - Provide group verbal and written instruction for methods to conserve energy, plan and organize activities. Instruct on pacing techniques, use of adaptive equipment and posture/positioning to relieve shortness of breath.   Triggers: - Group verbal and written instruction to review types of environmental controls: home humidity, furnaces, filters, dust mite/pet prevention, HEPA vacuums. To discuss weather changes, air quality and the benefits of nasal washing.   Exacerbations: - Group verbal and written instruction to provide: warning signs, infection symptoms, calling MD promptly,  preventive modes, and value of vaccinations. Review: effective airway clearance, coughing and/or vibration techniques. Create an Sports administrator.   Oxygen: - Individual and group verbal and written instruction on oxygen therapy. Includes supplement oxygen, available portable oxygen systems, continuous and intermittent flow rates, oxygen safety, concentrators, and Medicare reimbursement for oxygen.   Respiratory Medications: - Group verbal and written instruction to review medications for lung disease. Drug class, frequency, complications, importance of spacers, rinsing mouth after steroid MDI's, and proper cleaning methods for nebulizers. Flowsheet Row Pulmonary Rehab from 08/23/2016 in [facility] Cardiac and Pulmonary Rehab  Date  08/10/16  Educator  LB  Instruction Review Code  2- meets goals/outcomes      AED/CPR: - Group verbal and written instruction with the use of models to demonstrate the basic use of the AED with the basic ABC's of resuscitation.   Breathing Retraining: - Provides individuals verbal and written instruction on purpose, frequency, and proper technique of diaphragmatic breathing and pursed-lipped breathing. Applies individual practice skills. Flowsheet Row Pulmonary Rehab from 08/23/2016 in [facility] Cardiac and Pulmonary Rehab  Date  08/16/16  Educator  AS  Instruction Review Code  2- meets goals/outcomes      Anatomy and Physiology of the Lungs: - Group verbal and written instruction with the use of models to provide basic lung anatomy and physiology related to function, structure and complications of lung disease.   Heart Failure: - Group verbal and written instruction on the basics of heart failure: signs/symptoms, treatments, explanation of ejection fraction, enlarged heart and cardiomyopathy.   Sleep Apnea: - Individual verbal and written instruction to review Obstructive Sleep Apnea. Review of risk factors, methods for diagnosing and types of masks and machines  for OSA.   Anxiety: - Provides group, verbal and written instruction on the correlation between heart/lung disease and anxiety, treatment options, and management of anxiety.   Relaxation: - Provides group, verbal and written instruction about the benefits of relaxation for patients with heart/lung disease. Also provides patients with examples of relaxation techniques.   Knowledge Questionnaire Score:     Knowledge Questionnaire Score - 08/10/16 1214      Knowledge Questionnaire Score   Pre Score 9/10       Core Components/Risk Factors/Patient Goals at Admission:     Personal Goals and Risk Factors at Admission - 08/10/16 1231      Core Components/Risk Factors/Patient Goals on Admission   Intervention --  Ms Rasp would like to meet with the dietitian. She watches her diet and adds protein. Her BMI is 17.9 and normal range for her would be 18-24.   Goal Weight: Long Term 130 lb (59 kg)      Core Components/Risk Factors/Patient Goals Review:      Goals and Risk Factor Review    Row Name 08/16/16 1245 08/23/16 1251           Core Components/Risk Factors/Patient Goals Review   Personal Goals Review Develop more efficient breathing techniques such as purse lipped breathing and diaphragmatic breathing and practicing self-pacing with activity. Sedentary;Increase Strength and Stamina      Review Pursed lip breathing technique was discussed with the patient and how to use it with exercise and ADL's. Patient demonstrated understanding of this technique.  Zully has not been sore from beginning exercise.  She likes the warm up and strength exercises done in class. Her husband encourages her to exercise.      Expected Outcomes Patient will use pursed lip breathing to help control SOB during exercise and ADL's.  Christian will continue to build strength and stamina.         Core Components/Risk Factors/Patient Goals at Discharge (Final Review):      Goals and Risk Factor Review  - 08/23/16 1251      Core Components/Risk Factors/Patient Goals Review   Personal Goals Review Sedentary;Increase Strength and Stamina   Review Adriel has not been sore from beginning exercise.  She likes the warm up and strength exercises done in class. Her husband encourages her to exercise.   Expected Outcomes Nashiya will continue to build strength and stamina.      ITP Comments:  Comments: 30 day note review

## 2016-09-01 ENCOUNTER — Encounter: Payer: Self-pay | Admitting: Family Medicine

## 2016-09-01 DIAGNOSIS — J8410 Pulmonary fibrosis, unspecified: Secondary | ICD-10-CM

## 2016-09-01 DIAGNOSIS — J479 Bronchiectasis, uncomplicated: Secondary | ICD-10-CM

## 2016-09-01 DIAGNOSIS — J849 Interstitial pulmonary disease, unspecified: Secondary | ICD-10-CM | POA: Diagnosis not present

## 2016-09-01 MED ORDER — NITROFURANTOIN MACROCRYSTAL 50 MG PO CAPS: ORAL_CAPSULE | ORAL | 1 refills | Status: DC

## 2016-09-01 NOTE — Progress Notes (Signed)
Daily Session Note  Patient Details  Name: Crystal Gregory MRN: [id number] Date of Birth: 28-Feb-1932 Referring Provider:   Flowsheet Row Pulmonary Rehab from 08/10/2016 in [facility] Cardiac and Pulmonary Rehab  Referring Provider  Ramaswamy      Encounter Date: 09/01/2016  Check In:     Session Check In - 09/01/16 1219      Check-In   Location [facility]-Cardiac & Pulmonary Rehab   Staff Present Carson Myrtle, BS, RRT, Respiratory Therapist;Carroll Enterkin, RN, Vickki Hearing, BA, ACSM CEP, Exercise Physiologist   Supervising physician immediately available to respond to emergencies [facility] immediately available ER MD   Physician(s) Jimmye Norman and Reita Cliche   Medication changes reported     No   Fall or balance concerns reported    No   Warm-up and Cool-down Performed as group-led Location manager Performed Yes   VAD Patient? No     Pain Assessment   Currently in Pain? No/denies   Multiple Pain Sites No         Goals Met:  Proper associated with RPD/PD & O2 Sat Independence with exercise equipment Exercise tolerated well Strength training completed today  Goals Unmet:  Not Applicable  Comments: Pt able to follow exercise prescription today without complaint.  Will continue to monitor for progression.    Dr. Emily Filbert is Medical Director for [place] and [facility]monary Rehabilitation.

## 2016-09-03 ENCOUNTER — Encounter: Payer: Medicare Other | Attending: Internal Medicine | Admitting: *Deleted

## 2016-09-03 DIAGNOSIS — J479 Bronchiectasis, uncomplicated: Secondary | ICD-10-CM

## 2016-09-03 DIAGNOSIS — J849 Interstitial pulmonary disease, unspecified: Secondary | ICD-10-CM | POA: Insufficient documentation

## 2016-09-03 DIAGNOSIS — J8410 Pulmonary fibrosis, unspecified: Secondary | ICD-10-CM

## 2016-09-03 NOTE — Progress Notes (Signed)
Daily Session Note  Patient Details  Name: Crystal Gregory MRN: [id number] Date of Birth: 05-Nov-1931 Referring Provider:   Flowsheet Row Pulmonary Rehab from 08/10/2016 in [facility] Cardiac and Pulmonary Rehab  Referring Provider  Ramaswamy      Encounter Date: 09/03/2016  Check In:     Session Check In - 09/03/16 1041      Check-In   Location [facility]-Cardiac & Pulmonary Rehab   Staff Present Gerlene Burdock, RN, Vickki Hearing, BA, ACSM CEP, Exercise Physiologist;Other  Levell July, RN BSN   Supervising physician immediately available to respond to emergencies [facility] immediately available ER MD   Physician(s) Drs. Alfred Levins and [employer]   Medication changes reported     No   Fall or balance concerns reported    No   Warm-up and Cool-down Performed as group-led Location manager Performed Yes   VAD Patient? No     Pain Assessment   Currently in Pain? No/denies   Multiple Pain Sites No         Goals Met:  Proper associated with RPD/PD & O2 Sat Independence with exercise equipment Using PLB without cueing & demonstrates good technique Exercise tolerated well No report of cardiac concerns or symptoms Strength training completed today  Goals Unmet:  Not Applicable  Comments: Pt able to follow exercise prescription today without complaint.  Will continue to monitor for progression.    Dr. Emily Filbert is Medical Director for [place] and [facility].

## 2016-09-06 ENCOUNTER — Encounter: Payer: Medicare Other | Admitting: *Deleted

## 2016-09-06 DIAGNOSIS — J849 Interstitial pulmonary disease, unspecified: Secondary | ICD-10-CM | POA: Diagnosis not present

## 2016-09-06 DIAGNOSIS — J479 Bronchiectasis, uncomplicated: Secondary | ICD-10-CM

## 2016-09-06 DIAGNOSIS — J8410 Pulmonary fibrosis, unspecified: Secondary | ICD-10-CM

## 2016-09-06 NOTE — Progress Notes (Signed)
Daily Session Note  Patient Details  Name: Crystal Gregory MRN: [id number] Date of Birth: 1931-12-05 Referring Provider:   Flowsheet Row Pulmonary Rehab from 08/10/2016 in [facility] Cardiac and Pulmonary Rehab  Referring Provider  Ramaswamy      Encounter Date: 09/06/2016  Check In:     Session Check In - 09/06/16 1146      Check-In   Location [facility]-Cardiac & Pulmonary Rehab   Staff Present Earlean Shawl, BS, ACSM CEP, Exercise Physiologist;Amanda Oletta Darter, BA, ACSM CEP, Exercise Physiologist;Laureen Janell Quiet, RRT, Respiratory Therapist   Supervising physician immediately available to respond to emergencies [facility] immediately available ER MD   Physician(s) Jimmye Norman and Mariea Clonts   Medication changes reported     No   Fall or balance concerns reported    No   Warm-up and Cool-down Performed on first and last piece of equipment   Resistance Training Performed Yes   VAD Patient? No     Pain Assessment   Currently in Pain? No/denies   Multiple Pain Sites No         Goals Met:  Proper associated with RPD/PD & O2 Sat Independence with exercise equipment Exercise tolerated well Strength training completed today  Goals Unmet:  Not Applicable  Comments: Pt able to follow exercise prescription today without complaint.  Will continue to monitor for progression.    Dr. Emily Filbert is Medical Director for [place] and [facility].

## 2016-09-08 DIAGNOSIS — J479 Bronchiectasis, uncomplicated: Secondary | ICD-10-CM

## 2016-09-08 DIAGNOSIS — J8410 Pulmonary fibrosis, unspecified: Secondary | ICD-10-CM

## 2016-09-08 DIAGNOSIS — J849 Interstitial pulmonary disease, unspecified: Secondary | ICD-10-CM | POA: Diagnosis not present

## 2016-09-08 NOTE — Progress Notes (Signed)
Daily Session Note  Patient Details  Name: Crystal Gregory MRN: [id number] Date of Birth: 17-Oct-1931 Referring Provider:   Flowsheet Row Pulmonary Rehab from 08/10/2016 in [facility] Cardiac and Pulmonary Rehab  Referring Provider  Ramaswamy      Encounter Date: 09/08/2016  Check In:     Session Check In - 09/08/16 1334      Check-In   Location [facility]-Cardiac & Pulmonary Rehab   Staff Present Carson Myrtle, BS, RRT, Respiratory Lennie Hummer, MA, ACSM RCEP, Exercise Physiologist;Amanda Oletta Darter, BA, ACSM CEP, Exercise Physiologist   Supervising physician immediately available to respond to emergencies [facility] immediately available ER MD   Physician(s) Burlene Arnt and [place]   Medication changes reported     No   Fall or balance concerns reported    No   Warm-up and Cool-down Performed as group-led instruction   Resistance Training Performed Yes   VAD Patient? No         Goals Met:  Proper associated with RPD/PD & O2 Sat Independence with exercise equipment Exercise tolerated well Strength training completed today  Goals Unmet:  Not Applicable  Comments: Pt able to follow exercise prescription today without complaint.  Will continue to monitor for progression.    Dr. Emily Filbert is Medical Director for [place] and [facility].

## 2016-09-10 ENCOUNTER — Ambulatory Visit (INDEPENDENT_AMBULATORY_CARE_PROVIDER_SITE_OTHER): Payer: Medicare Other | Admitting: Internal Medicine

## 2016-09-10 ENCOUNTER — Encounter: Payer: Self-pay | Admitting: Internal Medicine

## 2016-09-10 ENCOUNTER — Other Ambulatory Visit: Payer: Medicare Other

## 2016-09-10 VITALS — BP 110/60 | HR 86 | Ht 68.0 in | Wt 119.8 lb

## 2016-09-10 DIAGNOSIS — R0600 Dyspnea, unspecified: Secondary | ICD-10-CM | POA: Diagnosis not present

## 2016-09-10 DIAGNOSIS — J849 Interstitial pulmonary disease, unspecified: Secondary | ICD-10-CM

## 2016-09-10 DIAGNOSIS — R0982 Postnasal drip: Secondary | ICD-10-CM

## 2016-09-10 DIAGNOSIS — R059 Cough, unspecified: Secondary | ICD-10-CM

## 2016-09-10 DIAGNOSIS — J479 Bronchiectasis, uncomplicated: Secondary | ICD-10-CM

## 2016-09-10 DIAGNOSIS — R0689 Other abnormalities of breathing: Secondary | ICD-10-CM

## 2016-09-10 DIAGNOSIS — I2510 Atherosclerotic heart disease of native coronary artery without angina pectoris: Secondary | ICD-10-CM

## 2016-09-10 DIAGNOSIS — I2584 Coronary atherosclerosis due to calcified coronary lesion: Secondary | ICD-10-CM

## 2016-09-10 DIAGNOSIS — R05 Cough: Secondary | ICD-10-CM

## 2016-09-10 MED ORDER — GLYCOPYRROLATE-FORMOTEROL 9-4.8 MCG/ACT IN AERO: 2.0000 | INHALATION_SPRAY | Freq: Two times a day (BID) | RESPIRATORY_TRACT | 0 refills | Status: AC

## 2016-09-10 MED ORDER — FLUTICASONE PROPIONATE 50 MCG/ACT NA SUSP: 2.0000 | Freq: Every day | NASAL | 5 refills | Status: DC

## 2016-09-10 NOTE — Patient Instructions (Addendum)
ICD-9-CM ICD-10-CM   1. ILD (interstitial lung disease) ([place]) 515 J84.9   2. Bronchiectasis without complication (HCC) A999333 J47.9   3. Post-nasal drip 784.91 R09.82   4. Cough 786.2 R05   5. Dyspnea and respiratory abnormality 786.09 R06.00     R06.89    Do ACCP ILD Questin Do IgG and IgM blood tests   - noticed just now that you have not had that as part o bronchiectasis workup Start  take generic fluticasone inhaler 2 squirts each nostril daily STart BEVESPI samples worth 2 months You can get Aerobika device to use  Continue pulmonary rehab Respect desire to avoid esbriet/ofev based on your encouter with another patient with side effects Hold off empiric long term prednisone as discussed Repeat HRCT supine and prone in feb 2018 (early to mid)  Get 2D echo by Dr Lise Auer at [facility] any time  Followup  = feb 2018 after CT chest

## 2016-09-10 NOTE — Progress Notes (Addendum)
Subjective:     Patient ID: Crystal Gregory, female   DOB: [birth year], 82 y.o.   MRN: [id number]  HPI  OV 08/03/2016  Chief Complaint  Patient presents with  . Follow-up    Pt states SOB. SOB w/ excertion. Pt states SOB all the time. Pt states pressure in chest. No productive cough     82 year old female accompanied by her husband. She is a retired Marine scientist. Her husband worked in [employer] and was in [place].   She tells me that in 1999 when she was 82 years of age she was admitted with septicemia and pneumonia. 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 She says that it took over 3 years to recover. She was on long-term oxygen therapy. She never required tracheostomy. She recovered from this and that is completely healthy life without any shortness of breath or any cough. In the interim she did not have any pulmonary imaging. 11 years ago her husband suffered a massive myocardial infarction and has been on a pacemaker ever since. On the daily walks patient would always wait for her husband. Some 10 years ago. Climb mountains in [place]. Even as of a year ago was able to do walks outpacing her t husband without any shortness of breath.   Then approximately in spring 2017 March/April 2017 she got she suffered a cold and after this was left with some persistent cough and shortness of breath. This was associated with loss of hair growth and nail growth and significant weight loss with these are recovering now. Symptoms just got worse over time. There was not much of sputum production. This then resulted in CT chest 03/22/2016 that shows diffuse bronchiectasis with some fibrotic pattern at the lung base. The CT chest was not a high-resolution CT chest. She was then referred to Dr. Stevenson Clinch. I reviewed his notes. He did autoimmune workup 06/21/2016 that was essentially negative except for borderline elevation rheumatoid factor. A high-resolution CT  chest 06/11/2016 that I personally visualized and reported below shows diffuse bronchiectasis but superimposed on that. Bibasal subpleural fibrosis pattern with some honeycombing that is consistent with UIP Personal opinio Chest spirometry 06/18/2016 that shows a mixed restrictive obstructive pattern. Do not have full pulmonary function test. She then underwent bronchoscopy with transbronchial l biopsy 07/05/2016 that is nondiagnostic. Currently appears somewhat better on September October 2017 she was treated with a short course prednisone and antibiotics which helped her cough immensely but her dyspnea is significant and still persistent and is progressive. This despite the short course prednisone. The cough is only mild and is dry. Noted breo did not help symptoms  Walking desaturation test 185 feet 3 laps on room air: 98% on all 3 laps and did not desaturate but got dyspneic   ACCP ILD question: patient left befor I could give this to her   has a past medical history of Allergy; GERD (gastroesophageal reflux disease); and Septicemia ([place]) (1999).   reports that she quit smoking about 35 years ago. She has a 15.00 pack-year smoking history. She has never used smokeless tobacco.   OV 09/10/2016  Chief Complaint  Patient presents with  . Follow-up    PFT results. pt states breathing is baseline. pt c/o sob with exertion , occ non prod cough & chest tightness with exercise.   Here to fu her ILD (confusing picture - suspect post pna/ards from > 10 years ago mild bronchiectasis with post inflmattory fibrosis  that was asymptomatic till spring 2017 and then flared up v new onset superimposed IPF, negative autoimmune panel and exposure hx with progression on CT spring -> fall 2017)  She is now in rehab and is likely helping. Hsuband with her this visit as well. Still with moderate dyspnea and cough. Sputum production is mild. Dyspnea is still the major issue for  Her - class 2-3 on exertion ,  releived by rest. She also has post nasal drip. She is interested in a PEP device for the bronchiectasis portion. She is not too keen on esbriet/ofev because one patient she met at rehab had significant GI intolerance to it. She is not too interested in undergoing surgical lung bx either. Short course prednisone helped but she is not too keen on chronic (few to several months) prednisone either.. She is worried about he progression on CT chest between spring 2017 (non HRCT) and fall 2017 (HRCT) She is open to inhaler Rx but ics/laba trials of 14 d x 2 did not help.   Review of lbs show - she is yet to have IgG and IgM eval for bronchiectasis  Also, PFT shows mod recution in dlco with normal spir and appears dyspnea is out of poporton for this. No echo on file  aCCP ILD question done but entered in 09/29/2016   - symptoms: cougn 10 mnths including cough at night (dry cough).  Dyspnea - level ground and falls behind  - past medical: hx of pna and gerd and sicca   - personal substance use hx: denies street drugs or rec drugs. 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   reports that she quit smoking about 35 years ago. She has a 15.00 pack-year smoking history. She has never used smokeless tobacco.  Past Surgical History:  Procedure Laterality Date  . ABDOMINAL HYSTERECTOMY    . FLEXIBLE BRONCHOSCOPY N/A 07/05/2016   Procedure: FLEXIBLE BRONCHOSCOPY;  Surgeon: Vilinda Boehringer, MD;  Location: [facility] ORS;  Service: Cardiopulmonary;  Laterality: N/A;    No Known Allergies  Immunization History  Administered Date(s) Administered  .  Influenza Split 06/04/2012  . Influenza,inj,Quad PF,36+ Mos 07/04/2013, 08/18/2015  . Pneumococcal Conjugate-13 10/06/2011  . Zoster 01/14/2007    Family History  Problem Relation Age of Onset  . Parkinson's disease Mother   . Colon polyps Mother 7       . Breast cancer Sister      Current Outpatient Prescriptions:  .  albuterol (PROVENTIL HFA;VENTOLIN HFA) 108 (90 Base) MCG/ACT inhaler, Inhale 2 puffs into the lungs every 6 (six) hours as needed., Disp: 1 Inhaler, Rfl: 0 .  AMBULATORY NON FORMULARY MEDICATION, Medication Name: Incentive Spirometry  Use 10-15 times daily, Disp: 1 each, Rfl: 0 .  Calcium Carbonate-Vit D-Min (CALCIUM 1200 PO), Take by mouth., Disp: , Rfl:  .  denosumab (PROLIA) 60 MG/ML SOLN injection, Inject 60 mg into the skin every 6 (six) months. Administer in upper arm, thigh, or abdomen, Disp: , Rfl:  .  nitrofurantoin (MACRODANTIN) 50 MG capsule, Take one by mouth daily, Disp: 90  capsule, Rfl: 1 .  pantoprazole (PROTONIX) 20 MG tablet, Take 1 tablet (20 mg total) by mouth daily., Disp: 90 tablet, Rfl: 1 .  propranolol (INDERAL) 10 MG tablet, TAKE 1 TABLET TWICE A DAY, Disp: 180 tablet, Rfl: 1 .  Respiratory Therapy Supplies (FLUTTER) DEVI, Use 10-15 times daily, Disp: 1 each, Rfl: 0 .  traZODone (DESYREL) 50 MG tablet, TAKE 1 TABLET AT BEDTIME, Disp: 90 tablet, Rfl: 1   Review of Systems     Objective:   Physical Exam  Constitutional: She is oriented to person, place, and time. She appears well-developed and well-nourished. No distress.  HENT:  Head: Normocephalic and atraumatic.  Right Ear: External ear normal.  Left Ear: External ear normal.  Mouth/Throat: Oropharynx is clear and moist. No oropharyngeal exudate.  Eyes: Conjunctivae and EOM are normal. Pupils are equal, round, and reactive to light. Right eye exhibits no discharge. Left eye exhibits no discharge. No scleral icterus.  Neck: Normal range of motion. Neck supple. No JVD present. No tracheal  deviation present. No thyromegaly present.  Cardiovascular: Normal rate, regular rhythm, normal heart sounds and intact distal pulses.  Exam reveals no gallop and no friction rub.   No murmur heard. Pulmonary/Chest: Effort normal. No respiratory distress. She has no wheezes. She has rales. She exhibits no tenderness.  R > L base crackles  Abdominal: Soft. Bowel sounds are normal. She exhibits no distension and no mass. There is no tenderness. There is no rebound and no guarding.  Musculoskeletal: Normal range of motion. She exhibits no edema or tenderness.  Lymphadenopathy:    She has no cervical adenopathy.  Neurological: She is alert and oriented to person, place, and time. She has normal reflexes. No cranial nerve deficit. She exhibits normal muscle tone. Coordination normal.  Skin: Skin is warm and dry. No rash noted. She is not diaphoretic. No erythema. No pallor.  Psychiatric: She has a normal mood and affect. Her behavior is normal. Judgment and thought content normal.  Vitals reviewed.  Vitals:   09/10/16 0948  BP: 110/60  Pulse: 86  SpO2: 97%  Weight: 119 lb 12.8 oz (54.3 kg)  Height: _0  (1.727 m)   Estimated body mass index is 18.22 kg/m as calculated from the following:   Height as of this encounter: _1  (1.727 m).   Weight as of this encounter: 119 lb 12.8 oz (54.3 kg).      Assessment:       ICD-9-CM ICD-10-CM   1. ILD (interstitial lung disease) ([place]) 515 J84.9   2. Bronchiectasis without complication (HCC) 295.6 J47.9   3. Post-nasal drip 784.91 R09.82   4. Cough 786.2 R05   5. Dyspnea and respiratory abnormality 786.09 R06.00     R06.89   Dyspnea appears out of proportion to PFT - so will get echo   Still ILD etiology is unclear - best possible explanation is post ARDS bronchiectasis asymptmoatic and had asy,mptomatic fibrosis as well that flared up now v superimposed UIP/IPF now  Empiric anti-fibrotic Rx could be an option for ILD piece but she is not  keen. She is not keen on lung bx either. She is not keen on long term prednisone empiric eother  Best to start with simpler measures of echo, LABA/LAMA combo, finish rehab and re-eval in spiring with CT chest. If remains undifferentiated and no other etiology: then will look at bx    Plan:      Do ACCP ILD Questin Do IgG and IgM  blood tests   - noticed just now that you have not had that as part o bronchiectasis workup Start  take generic fluticasone inhaler 2 squirts each nostril daily STart BEVESPI samples worth 2 months You can get Aerobika device to use  Continue pulmonary rehab Respect desire to avoid esbriet/ofev based on your encouter with another patient with side effects Hold off empiric long term prednisone as discussed Repeat HRCT supine and prone in feb 2018 (early to mid)  Get 2D echo by Dr Lise Auer at [facility] any time  Followup  = feb 2018 after CT chest  > 50% of this > 25 min visit spent in face to face counseling or coordination of care   Dr. Brand Males, M.D., [facility].C.P Pulmonary and Critical Care Medicine Staff Physician [place] [facility] Pager: [phone], If no answer or between  15:00h - 7:00h: call [phone]  09/10/2016 10:35 AM       PS: will inquire about nitrafurantoin more

## 2016-09-13 ENCOUNTER — Encounter: Payer: Medicare Other | Admitting: *Deleted

## 2016-09-13 DIAGNOSIS — J479 Bronchiectasis, uncomplicated: Secondary | ICD-10-CM

## 2016-09-13 DIAGNOSIS — J849 Interstitial pulmonary disease, unspecified: Secondary | ICD-10-CM | POA: Diagnosis not present

## 2016-09-13 DIAGNOSIS — J8410 Pulmonary fibrosis, unspecified: Secondary | ICD-10-CM

## 2016-09-13 LAB — IGM: IGM, SERUM: 234 mg/dL (ref 48–271)

## 2016-09-13 LAB — IGG: IgG (Immunoglobin G), Serum: 1976 mg/dL — ABNORMAL HIGH (ref 694–1618)

## 2016-09-13 NOTE — Progress Notes (Signed)
Daily Session Note  Patient Details  Name: Crystal Gregory MRN: [id number] Date of Birth: Aug 27, 1932 Referring Provider:   Flowsheet Row Pulmonary Rehab from 08/10/2016 in [facility] Cardiac and Pulmonary Rehab  Referring Provider  Ramaswamy      Encounter Date: 09/13/2016  Check In:     Session Check In - 09/13/16 1030      Check-In   Location [facility]-Cardiac & Pulmonary Rehab   Staff Present Carson Myrtle, BS, RRT, Respiratory Therapist;Jaymi Tinner Amedeo Plenty, BS, ACSM CEP, Exercise Physiologist;Amanda Oletta Darter, BA, ACSM CEP, Exercise Physiologist   Supervising physician immediately available to respond to emergencies [facility] immediately available ER MD   Physician(s) Clearnce Hasten and Joni Fears   Medication changes reported     No   Fall or balance concerns reported    No   Warm-up and Cool-down Performed on first and last piece of equipment   Resistance Training Performed Yes   VAD Patient? No     Pain Assessment   Currently in Pain? No/denies   Multiple Pain Sites No         Goals Met:  Proper associated with RPD/PD & O2 Sat Independence with exercise equipment Exercise tolerated well Strength training completed today  Goals Unmet:  Not Applicable  Comments: Pt able to follow exercise prescription today without complaint.  Will continue to monitor for progression.    Dr. Emily Filbert is Medical Director for [place] and [facility].

## 2016-09-15 DIAGNOSIS — J849 Interstitial pulmonary disease, unspecified: Secondary | ICD-10-CM | POA: Diagnosis not present

## 2016-09-15 DIAGNOSIS — J8410 Pulmonary fibrosis, unspecified: Secondary | ICD-10-CM

## 2016-09-15 DIAGNOSIS — J479 Bronchiectasis, uncomplicated: Secondary | ICD-10-CM

## 2016-09-15 NOTE — Progress Notes (Signed)
Daily Session Note  Patient Details  Name: Crystal Gregory MRN: [id number] Date of Birth: Jun 20, 1932 Referring Provider:   Flowsheet Row Pulmonary Rehab from 08/10/2016 in [facility] Cardiac and Pulmonary Rehab  Referring Provider  Ramaswamy      Encounter Date: 09/15/2016  Check In:     Session Check In - 09/15/16 1022      Check-In   Location [facility]-Cardiac & Pulmonary Rehab   Staff Present Alberteen Sam, MA, ACSM RCEP, Exercise Physiologist;Lei Dower Oletta Darter, BA, ACSM CEP, Exercise Physiologist;Laureen Owens Shark, BS, RRT, Respiratory Therapist   Supervising physician immediately available to respond to emergencies [facility] immediately available ER MD   Physician(s) Drs. Marcelene Butte and McShane   Medication changes reported     No   Fall or balance concerns reported    No   Warm-up and Cool-down Performed as group-led Location manager Performed Yes   VAD Patient? No     Pain Assessment   Currently in Pain? No/denies   Multiple Pain Sites No         Goals Met:  Proper associated with RPD/PD & O2 Sat Independence with exercise equipment Exercise tolerated well Strength training completed today  Goals Unmet:  Not Applicable  Comments: Pt able to follow exercise prescription today without complaint.  Will continue to monitor for progression.    Dr. Emily Filbert is Medical Director for [place] and [facility].

## 2016-09-17 DIAGNOSIS — J479 Bronchiectasis, uncomplicated: Secondary | ICD-10-CM

## 2016-09-17 DIAGNOSIS — J8410 Pulmonary fibrosis, unspecified: Secondary | ICD-10-CM

## 2016-09-17 DIAGNOSIS — J849 Interstitial pulmonary disease, unspecified: Secondary | ICD-10-CM | POA: Diagnosis not present

## 2016-09-17 NOTE — Progress Notes (Signed)
Daily Session Note  Patient Details  Name: Crystal Gregory MRN: [id number] Date of Birth: [birth year] Referring Provider:   Flowsheet Row Pulmonary Rehab from 08/10/2016 in [facility] Cardiac and Pulmonary Rehab  Referring Provider  Ramaswamy      Encounter Date: 09/17/2016  Check In:     Session Check In - 09/17/16 1202      Check-In   Location [facility]-Cardiac & Pulmonary Rehab   Staff Present Nyoka Cowden, RN, BSN, Kela Millin, BA, ACSM CEP, Exercise Physiologist;Other  Jena Gauss RN   Supervising physician immediately available to respond to emergencies [facility] immediately available ER MD   Physician(s) Jimmye Norman and Alfred Levins   Medication changes reported     No   Fall or balance concerns reported    No   Warm-up and Cool-down Performed as group-led Location manager Performed Yes   VAD Patient? No     Pain Assessment   Currently in Pain? No/denies         Goals Met:  Proper associated with RPD/PD & O2 Sat Independence with exercise equipment Exercise tolerated well Strength training completed today  Goals Unmet:  Not Applicable  Comments: Pt able to follow exercise prescription today without complaint.  Will continue to monitor for progression.    Dr. Emily Filbert is Medical Director for [place] and [facility].

## 2016-09-20 ENCOUNTER — Encounter: Payer: Medicare Other | Admitting: *Deleted

## 2016-09-20 ENCOUNTER — Encounter: Payer: Self-pay | Admitting: Respiratory Therapy

## 2016-09-20 DIAGNOSIS — J8410 Pulmonary fibrosis, unspecified: Secondary | ICD-10-CM

## 2016-09-20 DIAGNOSIS — J479 Bronchiectasis, uncomplicated: Secondary | ICD-10-CM

## 2016-09-20 DIAGNOSIS — J849 Interstitial pulmonary disease, unspecified: Secondary | ICD-10-CM | POA: Diagnosis not present

## 2016-09-20 NOTE — Progress Notes (Signed)
Pulmonary Individual Treatment Plan  Patient Details  Name: Crystal Gregory MRN: [id number] Date of Birth: 19-Aug-1932 Referring Provider:   Flowsheet Row Pulmonary Rehab from 08/10/2016 in [facility] Cardiac and Pulmonary Rehab  Referring Provider  Ramaswamy      Initial Encounter Date:  Flowsheet Row Pulmonary Rehab from 08/10/2016 in [facility] Cardiac and Pulmonary Rehab  Date  08/10/16  Referring Provider  Ramaswamy      Visit Diagnosis: Pulmonary fibrosis ([place])  Bronchiectasis without complication ([place])  Patient's Home Medications on Admission:  Current Outpatient Prescriptions:    albuterol (PROVENTIL HFA;VENTOLIN HFA) 108 (90 Base) MCG/ACT inhaler, Inhale 2 puffs into the lungs every 6 (six) hours as needed., Disp: 1 Inhaler, Rfl: 0   AMBULATORY NON FORMULARY MEDICATION, Medication Name: Incentive Spirometry  Use 10-15 times daily, Disp: 1 each, Rfl: 0   Calcium Carbonate-Vit D-Min (CALCIUM 1200 PO), Take by mouth., Disp: , Rfl:    denosumab (PROLIA) 60 MG/ML SOLN injection, Inject 60 mg into the skin every 6 (six) months. Administer in upper arm, thigh, or abdomen, Disp: , Rfl:    fluticasone (FLONASE) 50 MCG/ACT nasal spray, Place 2 sprays into both nostrils daily., Disp: 16 g, Rfl: 5   nitrofurantoin (MACRODANTIN) 50 MG capsule, Take one by mouth daily, Disp: 90 capsule, Rfl: 1   pantoprazole (PROTONIX) 20 MG tablet, Take 1 tablet (20 mg total) by mouth daily., Disp: 90 tablet, Rfl: 1   propranolol (INDERAL) 10 MG tablet, TAKE 1 TABLET TWICE A DAY, Disp: 180 tablet, Rfl: 1   Respiratory Therapy Supplies (FLUTTER) DEVI, Use 10-15 times daily, Disp: 1 each, Rfl: 0   traZODone (DESYREL) 50 MG tablet, TAKE 1 TABLET AT BEDTIME, Disp: 90 tablet, Rfl: 1  Past Medical History: Past Medical History:  Diagnosis Date   Allergy    GERD (gastroesophageal reflux disease)    Septicemia ([place]) 1999   spent 4 weeks in ICU, intubated -? PNA    Tobacco Use: History  Smoking Status    Former Smoker   Packs/day: 0.50   Years: 30.00   Quit date: 01/16/1981  Smokeless Tobacco   Never Used    Labs: Recent Review Flowsheet Data    There is no flowsheet data to display.       ADL UCSD:     Pulmonary Assessment Scores    Row Name 08/10/16 1216         ADL UCSD   ADL Phase Entry     SOB Score total 64     Rest 1     Walk 3     Stairs 3     Bath 3     Dress 3     Shop 2        Pulmonary Function Assessment:     Pulmonary Function Assessment - 08/10/16 1214      Pulmonary Function Tests   FVC% 80 %   FEV1% 63 %   FEV1/FVC Ratio 58     Initial Spirometry Results   Comments Test date 06/18/16     Breath   Bilateral Breath Sounds Clear   Shortness of Breath Yes;Limiting activity      Exercise Target Goals:    Exercise Program Goal: Individual exercise prescription set with THRR, safety & activity barriers. Participant demonstrates ability to understand and report RPE using BORG scale, to self-measure pulse accurately, and to acknowledge the importance of the exercise prescription.  Exercise Prescription Goal: Starting with aerobic activity 30 plus minutes a day, 3  days per week for initial exercise prescription. Provide home exercise prescription and guidelines that participant acknowledges understanding prior to discharge.  Activity Barriers & Risk Stratification:     Activity Barriers & Cardiac Risk Stratification - 08/10/16 1213      Activity Barriers & Cardiac Risk Stratification   Activity Barriers Shortness of Breath;Deconditioning   Cardiac Risk Stratification Low      6 Minute Walk:     6 Minute Walk    Row Name 08/10/16 1234         6 Minute Walk   Distance 1094 feet     Walk Time 6 minutes     # of Rest Breaks 0     MPH 2.07     METS 2.53     RPE 11     Perceived Dyspnea  3     VO2 Peak 7.78     Symptoms No     Resting HR 87 bpm     Resting BP 110/56     Max Ex. HR 103 bpm     Max Ex. BP 106/56        Interval HR   Baseline HR 87     1 Minute HR 95     2 Minute HR 101     3 Minute HR 103     4 Minute HR 103     5 Minute HR 103     6 Minute HR 103     2 Minute Post HR 77     Interval Heart Rate? Yes       Interval Oxygen   Interval Oxygen? Yes     Baseline Oxygen Saturation % 97 %     1 Minute Oxygen Saturation % 97 %     2 Minute Oxygen Saturation % 98 %     3 Minute Oxygen Saturation % 98 %     4 Minute Oxygen Saturation % 98 %     5 Minute Oxygen Saturation % 96 %     6 Minute Oxygen Saturation % 98 %     2 Minute Post Oxygen Saturation % 99 %        Initial Exercise Prescription:     Initial Exercise Prescription - 08/10/16 1200      Date of Initial Exercise RX and Referring Provider   Date 08/10/16   Referring Provider Ramaswamy     Treadmill   MPH 1.8   Grade 0.5   Minutes 15   METs 2.5     Recumbant Bike   Level 1   RPM 60   Minutes 15   METs 2     Recumbant Elliptical   Level 1   RPM 50   Minutes 15   METs 2     Elliptical   Level 1   Speed 2.5   Minutes 15   METs 2     Prescription Details   Frequency (times per week) 3   Duration Progress to 45 minutes of aerobic exercise without signs/symptoms of physical distress     Intensity   THRR 40-80% of Max Heartrate 106-125   Ratings of Perceived Exertion 11-15   Perceived Dyspnea 0-4     Progression   Progression Continue to progress workloads to maintain intensity without signs/symptoms of physical distress.     Resistance Training   Training Prescription Yes   Weight 2   Reps 10-12      Perform Capillary Blood Glucose checks as  needed.  Exercise Prescription Changes:     Exercise Prescription Changes    Row Name 08/16/16 1200 08/23/16 1200 08/24/16 1500 09/08/16 1300       Exercise Review   Progression  --  -- Yes Yes      Response to Exercise   Blood Pressure (Admit) 126/60  -- 114/60 110/60    Blood Pressure (Exercise) 126/58  -- 124/82 132/64    Blood Pressure  (Exit) 110/64  -- 122/70 90/52    Heart Rate (Admit) 78 bpm  -- 88 bpm 98 bpm    Heart Rate (Exercise) 109 bpm  -- 106 bpm 100 bpm    Heart Rate (Exit) 74 bpm  -- 79 bpm 74 bpm    Oxygen Saturation (Admit) 97 %  -- 96 % 98 %    Oxygen Saturation (Exercise) 94 %  -- 95 % 97 %    Oxygen Saturation (Exit) 98 %  -- 98 % 98 %    Rating of Perceived Exertion (Exercise) 13  -- 13 12    Perceived Dyspnea (Exercise) 3  -- 3 3    Duration  --  -- Progress to 45 minutes of aerobic exercise without signs/symptoms of physical distress Progress to 45 minutes of aerobic exercise without signs/symptoms of physical distress    Intensity  --  -- THRR unchanged THRR unchanged      Resistance Training   Training Prescription Yes  -- Yes Yes    Weight 2  -- 2 2    Reps 10-12  -- 10-12 10-12      Interval Training   Interval Training  --  -- No No      Treadmill   MPH 1.8  -- 1.8 1.8    Grade 0.5  -- 0.5 0.5    Minutes 15  -- 15 15    METs 2.5  -- 2.5 2.5      Recumbant Bike   Level 1  --  --  --    RPM 60  --  --  --    Minutes 15  --  --  --    METs 2  --  --  --      NuStep   Level  --  -- 1 2    Minutes  --  -- 15 15    METs  --  --  -- 2      Recumbant Elliptical   Level 1  --  --  --    RPM 50  --  --  --    Minutes 15  --  --  --    METs 2  --  --  --      REL-XR   Level  --  -- 1 2    Minutes  --  -- 15 15    METs  --  --  -- 2.2      Home Exercise Plan   Plans to continue exercise at  -- Home  --  --    Frequency  -- Add 1 additional day to program exercise sessions.  --  --       Exercise Comments:     Exercise Comments    Row Name 08/16/16 1250 08/23/16 1253 08/24/16 1529 09/08/16 1338     Exercise Comments First full day of exercise!  Patient was oriented to gym and equipment including functions, settings, policies, and procedures.  Patient's individual exercise prescription and treatment plan were  reviewed.  All starting workloads were established based on the  results of the 6 minute walk test done at initial orientation visit.  The plan for exercise progression was also introduced and progression will be customized based on patient's performance and goals. Crystal Gregory likes the warm up and strength exercises.  Home exercise was explained and she verbalized understanding of HR, RPE and safety. Crystal Gregory is progressing well in her first week of exercise. Crystal Gregory is progressing well with exercise and has added resistance to NS and XR.       Discharge Exercise Prescription (Final Exercise Prescription Changes):     Exercise Prescription Changes - 09/08/16 1300      Exercise Review   Progression Yes     Response to Exercise   Blood Pressure (Admit) 110/60   Blood Pressure (Exercise) 132/64   Blood Pressure (Exit) 90/52   Heart Rate (Admit) 98 bpm   Heart Rate (Exercise) 100 bpm   Heart Rate (Exit) 74 bpm   Oxygen Saturation (Admit) 98 %   Oxygen Saturation (Exercise) 97 %   Oxygen Saturation (Exit) 98 %   Rating of Perceived Exertion (Exercise) 12   Perceived Dyspnea (Exercise) 3   Duration Progress to 45 minutes of aerobic exercise without signs/symptoms of physical distress   Intensity THRR unchanged     Resistance Training   Training Prescription Yes   Weight 2   Reps 10-12     Interval Training   Interval Training No     Treadmill   MPH 1.8   Grade 0.5   Minutes 15   METs 2.5     NuStep   Level 2   Minutes 15   METs 2     REL-XR   Level 2   Minutes 15   METs 2.2       Nutrition:  Target Goals: Understanding of nutrition guidelines, daily intake of sodium '1500mg'$ , cholesterol '200mg'$ , calories 30% from fat and 7% or less from saturated fats, daily to have 5 or more servings of fruits and vegetables.  Biometrics:     Pre Biometrics - 08/10/16 1229      Pre Biometrics   Height 5' 8.25" (1.734 m)   Weight 118 lb 3.2 oz (53.6 kg)   Waist Circumference 26.75 inches   Hip Circumference 35.25 inches   Waist to Hip Ratio  0.76 %   BMI (Calculated) 17.9       Nutrition Therapy Plan and Nutrition Goals:   Nutrition Discharge: Rate Your Plate Scores:   Psychosocial: Target Goals: Acknowledge presence or absence of depression, maximize coping skills, provide positive support system. Participant is able to verbalize types and ability to use techniques and skills needed for reducing stress and depression.  Initial Review & Psychosocial Screening:     Initial Psych Review & Screening - 08/10/16 [place]? Yes   Comments Crystal Gregory has good support from her husband and 2 children. They live at [facility] and enjoy the retirement community life. Recently diagnosed with pulmonary fibrosis has been a challenge, and she is looking forward to the [place] program for disease management skills.     Barriers   Psychosocial barriers to participate in program There are no identifiable barriers or psychosocial needs.;The patient should benefit from training in stress management and relaxation.     Screening Interventions   Interventions Encouraged to exercise      Quality of Life Scores:  Quality of Life - 08/10/16 1231      Quality of Life Scores   Health/Function Pre 20.69 %   Socioeconomic Pre 20.83 %   Psych/Spiritual Pre 20.71 %   Family Pre 21 %   GLOBAL Pre 20.76 %      PHQ-9: Recent Review Flowsheet Data    Depression screen [facility] 2/9 08/10/2016   Decreased Interest 1   Down, Depressed, Hopeless 0   PHQ - 2 Score 1   Altered sleeping 1   Tired, decreased energy 3   Change in appetite 0   Feeling bad or failure about yourself  0   Trouble concentrating 0   Moving slowly or fidgety/restless 0   Suicidal thoughts 0   PHQ-9 Score 5      Psychosocial Evaluation and Intervention:     Psychosocial Evaluation - 08/16/16 1016      Psychosocial Evaluation & Interventions   Interventions Encouraged to exercise with the program and follow exercise  prescription   Comments Counselor met with Crystal. Gregory today for initial psychosocial evaluation.  She is an 82 year old who has IPF and Bronchiectasis.  She has a strong support system with a spouse of 9 years and (2) adult children.  She also lives in a retirement community.  Crystal. Gregory states she sleeps fine and her appetite has improved recently.  She reports a history of anxiety in the early 1970's subsequent to her spouse being in the [place] and her having two small children.  She denies any current symptoms of depression or anxiety and states she is typically in a positive mood.  Other than her health she states there is minimal stress in her life.  She has goals to increase her stamina and strength and be able to walk and stand without as much shortness of breath.  Crystal. Gregory plans to more consistently exercise following this program by walking, which she enjoys, and working out in the retirement [employer] (which she does not enjoy!).  Staff will follow with Crystal. Gregory throughout the course of this program.        Psychosocial Re-Evaluation:     Psychosocial Re-Evaluation    [place] Name 09/01/16 1121             Psychosocial Re-Evaluation   Comments Counselor follow up with Crystal. Gregory (Crystal. Gregory) today reporting she has realized how "out of shape" she was and is all the more committed to consistency in working out.  She hasn't recognized any progress yet as she has only been here two weeks and had to miss several days last week due to holidays and guests in her home.  Counselor will continue to follow.         Education: Education Goals: Education classes will be provided on a weekly basis, covering required topics. Participant will state understanding/return demonstration of topics presented.  Learning Barriers/Preferences:     Learning Barriers/Preferences - 08/10/16 1214      Learning Barriers/Preferences   Learning Barriers None   Learning Preferences None       Education Topics: Initial Evaluation Education: - Verbal, written and demonstration of respiratory meds, RPE/PD scales, oximetry and breathing techniques. Instruction on use of nebulizers and MDIs: cleaning and proper use, rinsing mouth with steroid doses and importance of monitoring MDI activations. Flowsheet Row Pulmonary Rehab from 09/17/2016 in [facility] Cardiac and Pulmonary Rehab  Date  08/10/16  Educator  LB  Instruction Review Code  2- meets goals/outcomes  General Nutrition Guidelines/Fats and Fiber: -Group instruction provided by verbal, written material, models and posters to present the general guidelines for heart healthy nutrition. Gives an explanation and review of dietary fats and fiber. Flowsheet Row Pulmonary Rehab from 09/17/2016 in [facility] Cardiac and Pulmonary Rehab  Date  08/23/16  Educator  CR  Instruction Review Code  2- meets goals/outcomes      Controlling Sodium/Reading Food Labels: -Group verbal and written material supporting the discussion of sodium use in heart healthy nutrition. Review and explanation with models, verbal and written materials for utilization of the food label. Flowsheet Row Pulmonary Rehab from 09/17/2016 in [facility] Cardiac and Pulmonary Rehab  Date  08/30/16  Educator  CR  Instruction Review Code  2- meets goals/outcomes      Exercise Physiology & Risk Factors: - Group verbal and written instruction with models to review the exercise physiology of the cardiovascular system and associated critical values. Details cardiovascular disease risk factors and the goals associated with each risk factor. Flowsheet Row Pulmonary Rehab from 09/17/2016 in [facility] Cardiac and Pulmonary Rehab  Date  08/18/16  Educator  AS  Instruction Review Code  2- meets goals/outcomes      Aerobic Exercise & Resistance Training: - Gives group verbal and written discussion on the health impact of inactivity. On the components of aerobic and resistive training  programs and the benefits of this training and how to safely progress through these programs.   Flexibility, Balance, General Exercise Guidelines: - Provides group verbal and written instruction on the benefits of flexibility and balance training programs. Provides general exercise guidelines with specific guidelines to those with heart or lung disease. Demonstration and skill practice provided.   Stress Management: - Provides group verbal and written instruction about the health risks of elevated stress, cause of high stress, and healthy ways to reduce stress.   Depression: - Provides group verbal and written instruction on the correlation between heart/lung disease and depressed mood, treatment options, and the stigmas associated with seeking treatment. Flowsheet Row Pulmonary Rehab from 09/17/2016 in [facility] Cardiac and Pulmonary Rehab  Date  09/06/16  Educator  [facility]  Instruction Review Code  2- meets goals/outcomes      Exercise & Equipment Safety: - Individual verbal instruction and demonstration of equipment use and safety with use of the equipment. Flowsheet Row Pulmonary Rehab from 09/17/2016 in [facility] Cardiac and Pulmonary Rehab  Date  08/16/16  Educator  AS  Instruction Review Code  2- meets goals/outcomes      Infection Prevention: - Provides verbal and written material to individual with discussion of infection control including proper hand washing and proper equipment cleaning during exercise session. Flowsheet Row Pulmonary Rehab from 09/17/2016 in [facility] Cardiac and Pulmonary Rehab  Date  08/16/16  Educator  AS  Instruction Review Code  2- meets goals/outcomes      Falls Prevention: - Provides verbal and written material to individual with discussion of falls prevention and safety. Flowsheet Row Pulmonary Rehab from 09/17/2016 in [facility] Cardiac and Pulmonary Rehab  Date  08/10/16  Educator  LB  Instruction Review Code  2- meets goals/outcomes      Diabetes: -  Individual verbal and written instruction to review signs/symptoms of diabetes, desired ranges of glucose level fasting, after meals and with exercise. Advice that pre and post exercise glucose checks will be done for 3 sessions at entry of program.   Chronic Lung Diseases: - Group verbal and written instruction to review new updates, new respiratory  medications, new advancements in procedures and treatments. Provide informative websites and "800" numbers of self-education.   Lung Procedures: - Group verbal and written instruction to describe testing methods done to diagnose lung disease. Review the outcome of test results. Describe the treatment choices: Pulmonary Function Tests, ABGs and oximetry.   Energy Conservation: - Provide group verbal and written instruction for methods to conserve energy, plan and organize activities. Instruct on pacing techniques, use of adaptive equipment and posture/positioning to relieve shortness of breath.   Triggers: - Group verbal and written instruction to review types of environmental controls: home humidity, furnaces, filters, dust mite/pet prevention, HEPA vacuums. To discuss weather changes, air quality and the benefits of nasal washing.   Exacerbations: - Group verbal and written instruction to provide: warning signs, infection symptoms, calling MD promptly, preventive modes, and value of vaccinations. Review: effective airway clearance, coughing and/or vibration techniques. Create an Sports administrator. Flowsheet Row Pulmonary Rehab from 09/17/2016 in [facility] Cardiac and Pulmonary Rehab  Date  09/08/16  Educator  LB  Instruction Review Code  2- meets goals/outcomes      Oxygen: - Individual and group verbal and written instruction on oxygen therapy. Includes supplement oxygen, available portable oxygen systems, continuous and intermittent flow rates, oxygen safety, concentrators, and Medicare reimbursement for oxygen.   Respiratory Medications: -  Group verbal and written instruction to review medications for lung disease. Drug class, frequency, complications, importance of spacers, rinsing mouth after steroid MDI's, and proper cleaning methods for nebulizers. Flowsheet Row Pulmonary Rehab from 09/17/2016 in [facility] Cardiac and Pulmonary Rehab  Date  08/10/16  Educator  LB  Instruction Review Code  2- meets goals/outcomes      AED/CPR: - Group verbal and written instruction with the use of models to demonstrate the basic use of the AED with the basic ABC's of resuscitation.   Breathing Retraining: - Provides individuals verbal and written instruction on purpose, frequency, and proper technique of diaphragmatic breathing and pursed-lipped breathing. Applies individual practice skills. Flowsheet Row Pulmonary Rehab from 09/17/2016 in [facility] Cardiac and Pulmonary Rehab  Date  08/16/16  Educator  AS  Instruction Review Code  2- meets goals/outcomes      Anatomy and Physiology of the Lungs: - Group verbal and written instruction with the use of models to provide basic lung anatomy and physiology related to function, structure and complications of lung disease. Flowsheet Row Pulmonary Rehab from 09/17/2016 in [facility] Cardiac and Pulmonary Rehab  Date  09/01/16  Educator  LB  Instruction Review Code  2- meets goals/outcomes      Heart Failure: - Group verbal and written instruction on the basics of heart failure: signs/symptoms, treatments, explanation of ejection fraction, enlarged heart and cardiomyopathy. Flowsheet Row Pulmonary Rehab from 09/17/2016 in [facility] Cardiac and Pulmonary Rehab  Date  09/17/16  Educator  SB  Instruction Review Code  2- meets goals/outcomes      Sleep Apnea: - Individual verbal and written instruction to review Obstructive Sleep Apnea. Review of risk factors, methods for diagnosing and types of masks and machines for OSA.   Anxiety: - Provides group, verbal and written instruction on the correlation  between heart/lung disease and anxiety, treatment options, and management of anxiety.   Relaxation: - Provides group, verbal and written instruction about the benefits of relaxation for patients with heart/lung disease. Also provides patients with examples of relaxation techniques.   Knowledge Questionnaire Score:     Knowledge Questionnaire Score - 08/10/16 1214  Knowledge Questionnaire Score   Pre Score 9/10       Core Components/Risk Factors/Patient Goals at Admission:     Personal Goals and Risk Factors at Admission - 08/10/16 1231      Core Components/Risk Factors/Patient Goals on Admission   Intervention --  Crystal Gregory would like to meet with the dietitian. She watches her diet and adds protein. Her BMI is 17.9 and normal range for her would be 18-24.   Goal Weight: Long Term 130 lb (59 kg)      Core Components/Risk Factors/Patient Goals Review:      Goals and Risk Factor Review    Row Name 08/16/16 1245 08/23/16 1251 09/08/16 1345 09/13/16 1535       Core Components/Risk Factors/Patient Goals Review   Personal Goals Review Develop more efficient breathing techniques such as purse lipped breathing and diaphragmatic breathing and practicing self-pacing with activity. Sedentary;Increase Strength and Stamina Sedentary;Increase Strength and Stamina;Develop more efficient breathing techniques such as purse lipped breathing and diaphragmatic breathing and practicing self-pacing with activity.;Improve shortness of breath with ADL's;Increase knowledge of respiratory medications and ability to use respiratory devices properly. Increase knowledge of respiratory medications and ability to use respiratory devices properly.    Review Pursed lip breathing technique was discussed with the patient and how to use it with exercise and ADL's. Patient demonstrated understanding of this technique.  Crystal Gregory has not been sore from beginning exercise.  She likes the warm up and strength  exercises done in class. Her husband encourages her to exercise. Crystal Gregory has an appointment with her pulmonologist tomorrow. I sent with her her ITP for him to see her progress with her exercise goals. Crystal Gregory is so motivated to learn as much as she can about her bronchectasis and pulmonary fibrosis, so I did give her educational material about these diseases. Crystal Gregory is interested in meeting with the dietitian and has been increasing her protein with Ensure. She rarely uses her Albuterol., but performs the PLB which is helpful with activity. I talked to her about the [place] for use with bronchiectasis, and she is planning to talk to her physician about this therapy. Crystal Gregory was recently order Spiriva by Crystal Chase Caller. He also ordered blood work, echo, and a CT of her lungs. She will also have an appointment with Crystal Gregory for a local pulmonologist.    Expected Outcomes Patient will use pursed lip breathing to help control SOB during exercise and ADL's.  Crystal Gregory will continue to build strength and stamina. Continue to increase her exercise goals and learn more disease management skills. Take her new MDI, Spiriva correctly.       Core Components/Risk Factors/Patient Goals at Discharge (Final Review):      Goals and Risk Factor Review - 09/13/16 1535      Core Components/Risk Factors/Patient Goals Review   Personal Goals Review Increase knowledge of respiratory medications and ability to use respiratory devices properly.   Review Crystal Gregory was recently order Spiriva by Crystal Chase Caller. He also ordered blood work, echo, and a CT of her lungs. She will also have an appointment with Crystal Gregory for a local pulmonologist.   Expected Outcomes Take her new MDI, Spiriva correctly.      ITP Comments:     ITP Comments    Row Name 09/03/16 1102           ITP Comments "Know Your Numbers" education was completed today with learning goals met.  Comments: 30 day note review

## 2016-09-20 NOTE — Progress Notes (Signed)
Daily Session Note  Patient Details  Name: Crystal Gregory MRN: [id number] Date of Birth: 1932/09/06 Referring Provider:   Flowsheet Row Pulmonary Rehab from 08/10/2016 in [facility] Cardiac and Pulmonary Rehab  Referring Provider  Ramaswamy      Encounter Date: 09/20/2016  Check In:     Session Check In - 09/20/16 1045      Check-In   Location [facility]-Cardiac & Pulmonary Rehab   Staff Present Carson Myrtle, BS, RRT, Respiratory Therapist;Shanieka Blea Amedeo Plenty, BS, ACSM CEP, Exercise Physiologist;Amanda Oletta Darter, BA, ACSM CEP, Exercise Physiologist;Mary Kellie Shropshire, RN, BSN, MA   Supervising physician immediately available to respond to emergencies [facility] immediately available ER MD   Physician(s) Dominic Pea and Paduchowski   Medication changes reported     No   Fall or balance concerns reported    No   Warm-up and Cool-down Performed on first and last piece of equipment   Resistance Training Performed Yes   VAD Patient? No     Pain Assessment   Currently in Pain? No/denies   Multiple Pain Sites No         Goals Met:  Proper associated with RPD/PD & O2 Sat Independence with exercise equipment Exercise tolerated well Strength training completed today  Goals Unmet:  Not Applicable  Comments: Pt able to follow exercise prescription today without complaint.  Will continue to monitor for progression.    Dr. Emily Filbert is Medical Director for [place] and [facility].

## 2016-09-22 DIAGNOSIS — J849 Interstitial pulmonary disease, unspecified: Secondary | ICD-10-CM | POA: Diagnosis not present

## 2016-09-22 DIAGNOSIS — J479 Bronchiectasis, uncomplicated: Secondary | ICD-10-CM

## 2016-09-22 DIAGNOSIS — J8410 Pulmonary fibrosis, unspecified: Secondary | ICD-10-CM

## 2016-09-22 NOTE — Progress Notes (Signed)
Daily Session Note  Patient Details  Name: Crystal Gregory MRN: [id number] Date of Birth: 08-10-1932 Referring Provider:   Flowsheet Row Pulmonary Rehab from 08/10/2016 in [facility] Cardiac and Pulmonary Rehab  Referring Provider  Ramaswamy      Encounter Date: 09/22/2016  Check In:     Session Check In - 09/22/16 1105      Check-In   Location [facility]-Cardiac & Pulmonary Rehab   Staff Present Alberteen Sam, MA, ACSM RCEP, Exercise Physiologist;Amanda Oletta Darter, BA, ACSM CEP, Exercise Physiologist;Laureen Owens Shark, BS, RRT, Respiratory Therapist   Supervising physician immediately available to respond to emergencies [facility] immediately available ER MD   Physician(s) Clearnce Hasten and Alfred Levins   Medication changes reported     No   Fall or balance concerns reported    No   Warm-up and Cool-down Performed as group-led Location manager Performed Yes   VAD Patient? No     Pain Assessment   Currently in Pain? No/denies   Multiple Pain Sites No         Goals Met:  Proper associated with RPD/PD & O2 Sat Independence with exercise equipment Exercise tolerated well Strength training completed today  Goals Unmet:  Not Applicable  Comments: Pt able to follow exercise prescription today without complaint.  Will continue to monitor for progression.    Dr. Emily Filbert is Medical Director for [place] and [facility].

## 2016-09-29 ENCOUNTER — Telehealth: Payer: Self-pay | Admitting: Internal Medicine

## 2016-09-29 ENCOUNTER — Encounter: Payer: Medicare Other | Admitting: *Deleted

## 2016-09-29 DIAGNOSIS — J479 Bronchiectasis, uncomplicated: Secondary | ICD-10-CM

## 2016-09-29 DIAGNOSIS — J8410 Pulmonary fibrosis, unspecified: Secondary | ICD-10-CM

## 2016-09-29 DIAGNOSIS — J849 Interstitial pulmonary disease, unspecified: Secondary | ICD-10-CM | POA: Diagnosis not present

## 2016-09-29 NOTE — Progress Notes (Signed)
Daily Session Note  Patient Details  Name: Crystal Gregory MRN: [id number] Date of Birth: 03-05-1932 Referring Provider:   Flowsheet Row Pulmonary Rehab from 08/10/2016 in [facility] Cardiac and Pulmonary Rehab  Referring Provider  Ramaswamy      Encounter Date: 09/29/2016  Check In:     Session Check In - 09/29/16 1213      Check-In   Location [facility]-Cardiac & Pulmonary Rehab   Staff Present Carson Myrtle, BS, RRT, Respiratory Lennie Hummer, MA, ACSM RCEP, Exercise Physiologist;Lai Hendriks RN BSN   Supervising physician immediately available to respond to emergencies [facility] immediately available ER MD   Physician(s) Drs. Archie Balboa and [employer]   Medication changes reported     No   Fall or balance concerns reported    No   Warm-up and Cool-down Performed as group-led Location manager Performed Yes   VAD Patient? No     Pain Assessment   Currently in Pain? No/denies   Multiple Pain Sites No         Goals Met:  Proper associated with RPD/PD & O2 Sat Independence with exercise equipment Using PLB without cueing & demonstrates good technique Exercise tolerated well No report of cardiac concerns or symptoms Strength training completed today  Goals Unmet:  Not Applicable  Comments: Pt able to follow exercise prescription today without complaint.  Will continue to monitor for progression.    Dr. Emily Filbert is Medical Director for [place] and [facility].

## 2016-09-29 NOTE — Telephone Encounter (Signed)
[name]  Please ask patient that I had some more time over the holidays to review her record . I want to know how long she has been taking nitrafurantoin. Please let me know what she says  Thanks  Dr. Brand Males, M.D., [facility].C.P Pulmonary and Critical Care Medicine Staff Physician [place] [facility] Pager: [phone], If no answer or between  15:00h - 7:00h: call [phone]  09/29/2016 3:36 AM

## 2016-10-01 ENCOUNTER — Encounter: Payer: Medicare Other | Admitting: *Deleted

## 2016-10-01 DIAGNOSIS — J849 Interstitial pulmonary disease, unspecified: Secondary | ICD-10-CM | POA: Diagnosis not present

## 2016-10-01 DIAGNOSIS — J8410 Pulmonary fibrosis, unspecified: Secondary | ICD-10-CM

## 2016-10-01 NOTE — Progress Notes (Addendum)
Daily Session Note  Patient Details  Name: Crystal Gregory MRN: [id number] Date of Birth: [birth year] Referring Provider:   Flowsheet Row Pulmonary Rehab from 08/10/2016 in [facility] Cardiac and Pulmonary Rehab  Referring Provider  Ramaswamy      Encounter Date: 10/01/2016  Check In:     Session Check In - 10/01/16 1102      Check-In   Location [facility]-Cardiac & Pulmonary Rehab   Staff Present Gerlene Burdock, RN, BSN;Patricia Surles RN BSN;Rebecca Brayton El, DPT, CEEA   Supervising physician immediately available to respond to emergencies [facility] immediately available ER MD   Physician(s) Dr. Corky Downs and Dr. Marcelene Butte   Medication changes reported     No   Fall or balance concerns reported    No   Warm-up and Cool-down Performed on first and last piece of equipment   Resistance Training Performed Yes   VAD Patient? No     Pain Assessment   Currently in Pain? No/denies         Goals Met:  Proper associated with RPD/PD & O2 Sat Exercise tolerated well  Goals Unmet:  Not Applicable  Comments:   Pt able to follow exercise prescription today without complaint.  Will continue to monitor for progression.     [place] Name 08/10/16 1234 10/01/16 1056       6 Minute Walk   Phase  - Mid Program    Distance 1094 feet 1165 feet    Walk Time 6 minutes 6 minutes    # of Rest Breaks 0 0    MPH 2.07  -    METS 2.53 2.69    RPE 11 12    Perceived Dyspnea  3 3    VO2 Peak 7.78 9.42    Symptoms No  -    Resting HR 87 bpm 85 bpm    Resting BP 110/56 104/58    Max Ex. HR 103 bpm 98 bpm    Max Ex. BP 106/56 138/64      Interval HR   Baseline HR 87  -    1 Minute HR 95 99    2 Minute HR 101 98    3 Minute HR 103 104    4 Minute HR 103 97    5 Minute HR 103 104    6 Minute HR 103 98    2 Minute Post HR 77 93    Interval Heart Rate? Yes  -      Interval Oxygen   Interval Oxygen? Yes  -    Baseline Oxygen Saturation % 97 % 97 %  all values on room air    1  Minute Oxygen Saturation % 97 % 97 %    2 Minute Oxygen Saturation % 98 % 98 %    3 Minute Oxygen Saturation % 98 % 96 %    4 Minute Oxygen Saturation % 98 % 97 %    5 Minute Oxygen Saturation % 96 % 95 %    6 Minute Oxygen Saturation % 98 % 97 %    2 Minute Post Oxygen Saturation % 99 % 98 %         Dr. Emily Filbert is Medical Director for [place] and [facility].

## 2016-10-05 NOTE — Telephone Encounter (Signed)
Called and spoke to pt. Pt states she has been on the Macrodantin 50mg  between 15-20 years for prophylaxis of UTI's.

## 2016-10-06 ENCOUNTER — Encounter: Payer: Self-pay | Admitting: Family Medicine

## 2016-10-06 ENCOUNTER — Encounter: Payer: Medicare Other | Attending: Internal Medicine

## 2016-10-06 ENCOUNTER — Ambulatory Visit (INDEPENDENT_AMBULATORY_CARE_PROVIDER_SITE_OTHER): Payer: Medicare Other

## 2016-10-06 ENCOUNTER — Other Ambulatory Visit: Payer: Self-pay

## 2016-10-06 DIAGNOSIS — J479 Bronchiectasis, uncomplicated: Secondary | ICD-10-CM

## 2016-10-06 DIAGNOSIS — J849 Interstitial pulmonary disease, unspecified: Secondary | ICD-10-CM

## 2016-10-06 DIAGNOSIS — J8410 Pulmonary fibrosis, unspecified: Secondary | ICD-10-CM

## 2016-10-06 DIAGNOSIS — R0600 Dyspnea, unspecified: Secondary | ICD-10-CM | POA: Diagnosis not present

## 2016-10-06 DIAGNOSIS — R0689 Other abnormalities of breathing: Secondary | ICD-10-CM | POA: Diagnosis not present

## 2016-10-06 NOTE — Telephone Encounter (Signed)
[name]  Please let [name] know that there is a possibility that she some of her ILD might be related to chronic nitrafurantoin intake and my recommendation is that she di/w PCP [name], MD or whoever gave it to her and stop it straight away. We nee to stop it for several months to see if ILD will go away. At [facility] in mid-feb 2018 - also get PFT (Pre-bd spiro and dlco only. No lung volume or bd response. No post-bd spiro) along with the CT Chest.   Also let her know that depending on how things resolv or not after dc of nitrafourantoin, I will consider steroids at [facility]  For now; agree with holding off esbriet/ofev (she was not inclined towards it anyway)  Also: my apologies for not pickin up on the nitrafurantoin straight away - only realized as I was going through her chart in more detail ovr the holiday break and review of her ACCP ILD questionnaire which she did only the 2nd visit  Thanks  Dr. Brand Males, M.D., [facility].C.P Pulmonary and Critical Care Medicine Staff Physician [place] [facility] and Critical [facility] Pager: [phone], If no answer or between  15:00h - 7:00h: call [phone]  10/06/2016 9:51 AM

## 2016-10-06 NOTE — Progress Notes (Signed)
Daily Session Note  Patient Details  Name: Paizlie Klaus MRN: [id number] Date of Birth: 11/24/1931 Referring Provider:   Flowsheet Row Pulmonary Rehab from 08/10/2016 in [facility] Cardiac and Pulmonary Rehab  Referring Provider  Ramaswamy      Encounter Date: 10/06/2016  Check In:     Session Check In - 10/06/16 1115      Check-In   Location [facility]-Cardiac & Pulmonary Rehab   Staff Present Carson Myrtle, BS, RRT, Respiratory Lennie Hummer, MA, ACSM RCEP, Exercise Physiologist;Iesha [place] Oletta Darter, BA, ACSM CEP, Exercise Physiologist   Supervising physician immediately available to respond to emergencies [facility] immediately available ER MD   Physician(s) Clearnce Hasten and Joni Fears   Medication changes reported     No   Fall or balance concerns reported    No   Warm-up and Cool-down Performed as group-led instruction   Resistance Training Performed No   VAD Patient? No         Goals Met:  Proper associated with RPD/PD & O2 Sat Independence with exercise equipment Exercise tolerated well Strength training completed today  Goals Unmet:  Not Applicable  Comments:   Pt able to follow exercise prescription today without complaint.  Will continue to monitor for progression.  Dr. Emily Filbert is Medical Director for [place] and [facility].

## 2016-10-06 NOTE — Telephone Encounter (Signed)
Called and spoke to pt. Informed her of the recs per MR. Pt verbalized understanding and states she will speak with her PCP about coming off medication. PFT appt made for 11/18/16 at 1000. Pt verbalized understanding and denied any further questions or concerns at this time.

## 2016-10-07 ENCOUNTER — Encounter: Payer: Self-pay | Admitting: Pulmonary Disease

## 2016-10-07 ENCOUNTER — Ambulatory Visit: Payer: Medicare Other | Admitting: Pulmonary Disease

## 2016-10-07 ENCOUNTER — Ambulatory Visit (INDEPENDENT_AMBULATORY_CARE_PROVIDER_SITE_OTHER): Payer: Medicare Other | Admitting: Pulmonary Disease

## 2016-10-07 VITALS — BP 118/74 | HR 86 | Wt 119.0 lb

## 2016-10-07 DIAGNOSIS — J471 Bronchiectasis with (acute) exacerbation: Secondary | ICD-10-CM

## 2016-10-07 DIAGNOSIS — J449 Chronic obstructive pulmonary disease, unspecified: Secondary | ICD-10-CM

## 2016-10-07 DIAGNOSIS — J8410 Pulmonary fibrosis, unspecified: Secondary | ICD-10-CM | POA: Diagnosis not present

## 2016-10-07 DIAGNOSIS — J441 Chronic obstructive pulmonary disease with (acute) exacerbation: Secondary | ICD-10-CM | POA: Diagnosis not present

## 2016-10-07 MED ORDER — DOXYCYCLINE HYCLATE 100 MG PO TABS: 100.0000 mg | ORAL_TABLET | Freq: Two times a day (BID) | ORAL | 5 refills | Status: AC

## 2016-10-07 MED ORDER — PREDNISONE 20 MG PO TABS: 40.0000 mg | ORAL_TABLET | Freq: Every day | ORAL | 5 refills | Status: DC

## 2016-10-07 NOTE — Patient Instructions (Addendum)
1) Doxycycline course as needed for increased or discolored sputum 2) Prednisone as needed for increased shortness of breath or wheezing 3) More liberal use of albuterol to see how much it helps  Follow up in 4 months

## 2016-10-08 NOTE — Progress Notes (Signed)
PULMONARY OFFICE FOLLOW UP NOTE  PROBLEMS: History of prior ARDS 1999 Prior long term nitrofurantoin therapy Pulmonary fibrosis Bronchiectasis Mild COPD  PT PROFILE: 83 y.o. F with remote minimal smoking history (15 PY, quit 1982) initially evaluated by Dr Stevenson Clinch 03/2016 and subsequently by Dr Chase Caller for pulmonary fibrosis. Pt has prior history of severe ARDS (1999) and has been on chronic nitrofurantoin for recurrent UTIs. She has been tried on 2 maintenance inhalers without discernible benefit.   DATA: CT chest 03/22/16: Generalized pulmonary hyperinflation but without bullous emphysema. Widespread bronchiectasis with bronchial wall thickening consistent with inflammatory bronchitis. Areas of pulmonary fibrosis, most pronounced in the lower lobes, where the bronchiectasis is more extensive, including areas of pulmonary lung destruction HRCT chest 06/11/16: Diffuse cylindrical and varicoid bronchiectasis throughout both lungs, most severe at the lung bases. Basilar predominant fibrotic interstitial lung disease with patchy honeycombing, with mild progression in the short interval since 03/22/2016 Spirometry 06/18/16: Mild obstruction, FEV1 1.42 liters (63%), FVC 2.43 (80%) Bronchoscopy 07/05/16: Normal airway exam. BAL negative for AFB Echocardiogram 10/07/15: LVEF 60%, Grade I DD, mild MR, RVSP estimate 35 mmHg  INTERVAL HISTORY: Saw Dr Chase Caller 09/10/16 who noted chronic nitrofurantoin therapy and advised discontinuation.   SUBJ: Her baseline (before May 2017) is no limiting DOE. In May, she suffered an URI with NP cough and dyspnea and has noted persistent DOE (class II) since then. She sometimes feels SOB with speech. She has developed recurrent NP cough over past 10 days with posterior nasal drainage. She does not feel that she needs an antibiotic currently but would like to have one on hand if symptoms worsenShe denies CP, fever, purulent sputum, hemoptysis, LE edema and calf  tenderness.    OBJ: Vitals:   10/07/16 1140  BP: 118/74  Pulse: 86  SpO2: 97%  Weight: 119 lb (54 kg)  RA  Gen: NAD HEENT: All WNL Neck: NO LAN, no JVD noted Lungs: slightly decreased BS, with no adventitious sounds, normal percussion throughout Cardiovascular: Reg rate, normal rhythm, no M noted Abdomen: Soft, NT +BS Ext: no C/C/E Neuro: CNs intact, motor/sens grossly intact Skin: No lesions noted   DATA: No new radiographic or PFT data  IMPRESSION: 1) Pulmonary fibrosis, nonspecific - doubt IPF 2) Bronchiectasis  3) COPD, mild 4) ? Mild/early COPD exacerbation   I think her PF and bronchiectasis are most likely sequelae from her episode of severe ARDS in 1999. She describes being intubated for 4 weeks. The CT pattern of fibrosis is asymmetric and I think is less likely to represent IPF. Although it seems unlikely that nitrofurantoin has contributed much to this, continuing that medication simply "muddies the waters" and I agree with discontinuation of this medication.   PLAN: 1) Doxycycline prescription with refills has been provided to be initiated if she develops cough productive of purulent sputum 2) Prednisone prescription with refills has been provided to be initiated if she develops chest tightness or wheezing 3) I have encouraged more liberal use of albuterol inhaler (she currently is not using it at all) to see if she can discern any benefit. If she can and is using it more than a few times per week, we might consider repeat trial of long acting bronchodilator therapy 4) I have offered that I can provide the care for pulmonary fibrosis but I have no objection if she wishes to continue to see Dr Chase Caller 5) She should undergo annual PFT and CXR. I will reserve further CT scans of her chest for changes  on CXR or worsening symptoms  Follow up in 4 months or PRN   [name], MD PCCM service Mobile [phone] Pager [phone] 10/08/2016

## 2016-10-11 ENCOUNTER — Encounter: Payer: Medicare Other | Admitting: *Deleted

## 2016-10-11 DIAGNOSIS — J479 Bronchiectasis, uncomplicated: Secondary | ICD-10-CM

## 2016-10-11 DIAGNOSIS — J849 Interstitial pulmonary disease, unspecified: Secondary | ICD-10-CM | POA: Diagnosis not present

## 2016-10-11 DIAGNOSIS — J8410 Pulmonary fibrosis, unspecified: Secondary | ICD-10-CM

## 2016-10-11 NOTE — Progress Notes (Signed)
Daily Session Note  Patient Details  Name: Crystal Gregory MRN: [id number] Date of Birth: [birth year] Referring Provider:   Flowsheet Row Pulmonary Rehab from 08/10/2016 in [facility] Cardiac and Pulmonary Rehab  Referring Provider  Ramaswamy      Encounter Date: 10/11/2016  Check In:     Session Check In - 10/11/16 1015      Check-In   Location [facility]-Cardiac & Pulmonary Rehab   Staff Present Nada Maclachlan, BA, ACSM CEP, Exercise Physiologist;Laureen Owens Shark, BS, RRT, Respiratory Bertis Ruddy, BS, ACSM CEP, Exercise Physiologist   Supervising physician immediately available to respond to emergencies [facility] immediately available ER MD   Physician(s) Drs. McShane and Stafford   Medication changes reported     No   Fall or balance concerns reported    No   Warm-up and Cool-down Performed on first and last piece of equipment   Resistance Training Performed Yes   VAD Patient? No     Pain Assessment   Currently in Pain? No/denies   Multiple Pain Sites No         Goals Met:  Proper associated with RPD/PD & O2 Sat Independence with exercise equipment Exercise tolerated well Strength training completed today  Goals Unmet:  Not Applicable  Comments: Pt able to follow exercise prescription today without complaint.  Will continue to monitor for progression.    Dr. Emily Filbert is Medical Director for [place] and [facility].

## 2016-10-11 NOTE — Progress Notes (Signed)
Curt Bears psychosocial assessment reveals no barriers at this time to participation in Pulmonary Rehab.  She has good family and friend support that encourages Earnestene to participate in [place] and progress with Her goals.  Sherrell concerns are monitored, but she has acknowledge that attending the program has helped to maintain quality life with improved mobility, self-care, and emotional and financial stability.  Robbie is commended for regular attendance and self-motivation to improve Her pulmonary disease management. During a recent appointment with her pulmonologist, she was very encouraged that due to her good diet, exercise, attitude, and family support, her physician stated her lung disease was very manageable.

## 2016-10-11 NOTE — Progress Notes (Signed)
Crystal Gregory psychosocial assessment reveals no barriers at this time to participation in Pulmonary Rehab.  Crystal Gregory has good family and friend support that encourages Crystal Gregory to participate in [place] and progress with Crystal Gregory goals.  Crystal Gregory concerns are monitored, but Crystal Gregory has acknowledge that attending the program has helped to maintain quality life with improved mobility, self-care, and emotional and financial stability.  Crystal Gregory is commended for regular attendance and self-motivation to improve Crystal Gregory pulmonary disease management. Crystal Gregory, Crystal Gregory was very encouraged that due to Crystal Gregory good diet, exercise, attitude, and family support, Crystal Gregory stated Crystal Gregory lung disease was very manageable.

## 2016-10-13 DIAGNOSIS — J479 Bronchiectasis, uncomplicated: Secondary | ICD-10-CM

## 2016-10-13 DIAGNOSIS — J849 Interstitial pulmonary disease, unspecified: Secondary | ICD-10-CM | POA: Diagnosis not present

## 2016-10-13 DIAGNOSIS — J8410 Pulmonary fibrosis, unspecified: Secondary | ICD-10-CM

## 2016-10-13 NOTE — Progress Notes (Signed)
Daily Session Note  Patient Details  Name: Crystal Gregory MRN: [id number] Date of Birth: 11/17/1931 Referring Provider:   Flowsheet Row Pulmonary Rehab from 08/10/2016 in [facility] Cardiac and Pulmonary Rehab  Referring Provider  Ramaswamy      Encounter Date: 10/13/2016  Check In:     Session Check In - 10/13/16 1211      Check-In   Location [facility]-Cardiac & Pulmonary Rehab   Staff Present Carson Myrtle, BS, RRT, Respiratory Lennie Hummer, MA, ACSM RCEP, Exercise Physiologist;Ashleyanne [place] Oletta Darter, BA, ACSM CEP, Exercise Physiologist   Supervising physician immediately available to respond to emergencies [facility] immediately available ER MD   Physician(s) Darl Householder and Alfred Levins   Medication changes reported     No   Fall or balance concerns reported    No   Warm-up and Cool-down Performed as group-led Location manager Performed Yes   VAD Patient? No     Pain Assessment   Currently in Pain? No/denies   Multiple Pain Sites No         Goals Met:  Proper associated with RPD/PD & O2 Sat Independence with exercise equipment Exercise tolerated well Strength training completed today  Goals Unmet:  Not Applicable  Comments: Pt able to follow exercise prescription today without complaint.  Will continue to monitor for progression.    Dr. Emily Filbert is Medical Director for [place] and [facility].

## 2016-10-15 ENCOUNTER — Encounter: Payer: Medicare Other | Admitting: *Deleted

## 2016-10-15 DIAGNOSIS — J849 Interstitial pulmonary disease, unspecified: Secondary | ICD-10-CM | POA: Diagnosis not present

## 2016-10-15 DIAGNOSIS — J8410 Pulmonary fibrosis, unspecified: Secondary | ICD-10-CM

## 2016-10-15 NOTE — Progress Notes (Signed)
Daily Session Note  Patient Details  Name: Crystal Gregory MRN: [id number] Date of Birth: December 03, 1931 Referring Provider:   Flowsheet Row Pulmonary Rehab from 08/10/2016 in [facility] Cardiac and Pulmonary Rehab  Referring Provider  Ramaswamy      Encounter Date: 10/15/2016  Check In:     Session Check In - 10/15/16 1115      Check-In   Location [facility]-Cardiac & Pulmonary Rehab   Staff Present Gerlene Burdock, RN, Vickki Hearing, BA, ACSM CEP, Exercise Physiologist;Patricia Surles RN BSN   Supervising physician immediately available to respond to emergencies [facility] immediately available ER MD   Physician(s) Dr. Katy Fitch and Dr. Jimmye Norman   Medication changes reported     No   Fall or balance concerns reported    No   Warm-up and Cool-down Performed as group-led instruction   Resistance Training Performed Yes     Pain Assessment   Currently in Pain? No/denies         Goals Met:  Proper associated with RPD/PD & O2 Sat Exercise tolerated well  Goals Unmet:  Not Applicable  Comments:     Dr. Emily Filbert is Medical Director for [place] and [facility].

## 2016-10-18 ENCOUNTER — Encounter: Payer: Self-pay | Admitting: Respiratory Therapy

## 2016-10-18 ENCOUNTER — Encounter: Payer: Medicare Other | Admitting: *Deleted

## 2016-10-18 DIAGNOSIS — J479 Bronchiectasis, uncomplicated: Secondary | ICD-10-CM

## 2016-10-18 DIAGNOSIS — J8410 Pulmonary fibrosis, unspecified: Secondary | ICD-10-CM

## 2016-10-18 DIAGNOSIS — J849 Interstitial pulmonary disease, unspecified: Secondary | ICD-10-CM | POA: Diagnosis not present

## 2016-10-18 NOTE — Progress Notes (Signed)
Pulmonary Individual Treatment Plan ° °Patient Details  °Name: Crystal Gregory °MRN: [id number] °Date of Birth: 12/25/1931 °Referring Provider:   °Flowsheet Row Pulmonary Rehab from 08/10/2016 in [facility]  °Referring Provider  Ramaswamy  °  ° ° °Initial Encounter Date:  °Flowsheet Row Pulmonary Rehab from 08/10/2016 in [facility] Re[facility]  °Date  08/10/16  °Referring Provider  Ramaswamy  °  ° ° °Visit Diagnosis: Pulmonary fibrosis (HCC) ° °Bronchiectasis without complication (HCC) ° °Patient's Home Medications on Admission: ° °Current Outpatient Prescriptions:  °•  albuterol (PROVENTIL HFA;VENTOLIN HFA) 108 (90 Base) MCG/ACT inhaler, Inhale 2 puffs into the lungs every 6 (six) hours as needed., Disp: 1 Inhaler, Rfl: 0 °•  AMBULATORY NON FORMULARY MEDICATION, Medication Name: Incentive Spirometry  Use 10-15 times daily, Disp: 1 each, Rfl: 0 °•  Calcium Carbonate-Vit D-Min (CALCIUM 1200 PO), Take by mouth., Disp: , Rfl:  °•  denosumab (PROLIA) 60 MG/ML SOLN injection, Inject 60 mg into the skin every 6 (six) months. Administer in upper arm, thigh, or abdomen, Disp: , Rfl:  °•  fluticasone (FLONASE) 50 MCG/ACT nasal spray, Place 2 sprays into both nostrils daily., Disp: 16 g, Rfl: 5 °•  pantoprazole (PROTONIX) 20 MG tablet, Take 1 tablet (20 mg total) by mouth daily., Disp: 90 tablet, Rfl: 1 °•  propranolol (INDERAL) 10 MG tablet, TAKE 1 TABLET TWICE A DAY, Disp: 180 tablet, Rfl: 1 °•  Respiratory Therapy Supplies (FLUTTER) DEVI, Use 10-15 times daily, Disp: 1 each, Rfl: 0 °•  traZODone (DESYREL) 50 MG tablet, TAKE 1 TABLET AT BEDTIME, Disp: 90 tablet, Rfl: 1 ° °Past Medical History: °Past Medical History:  °Diagnosis Date  °• Allergy   °• GERD (gastroesophageal reflux disease)   °• Septicemia (HCC) 1999  ° spent 4 weeks in ICU, intubated -? PNA  ° ° °Tobacco Use: °History  °Smoking Status  °• Former Smoker  °• Packs/day: 0.50  °• Years: 30.00  °• Quit date: 01/16/1981  °Smokeless Tobacco   °• Never Used  ° ° °Labs: °Recent Review Flowsheet Data   ° There is no flowsheet data to display.  °  ° ° ° °ADL [facility]: °  °  °Pulmonary Assessment Scores   ° Row Name 08/10/16 1216 10/01/16 1324  °  °  ° ADL [facility]SD  ° ADL Phase Entry Mid   ° SOB Score total 64 50   ° Rest 1 0   ° Walk 3 2   ° Stairs 3 3   ° Bath 3 2   ° Dress 3 2   ° Shop 2 2   °  ° ° °Pulmonary Function Assessment: °  °  °Pulmonary Function Assessment - 08/10/16 1214   °  ° Pulmonary Function Tests  ° FVC% 80 %  ° FEV1% 63 %  ° FEV1/FVC Ratio 58  °  ° Initial Spirometry Results  ° Comments Test date 06/18/16  °  ° Breath  ° Bilateral Breath Sounds Clear  ° Shortness of Breath Yes;Limiting activity  °  ° ° °Exercise Target Goals: °  ° °Exercise Program Goal: °Individual exercise prescription set with THRR, safety & activity barriers. Participant demonstrates ability to understand and report RPE using BORG scale, to self-measure pulse accurately, and to acknowledge the importance of the exercise prescription. ° °Exercise Prescription Goal: °Starting with aerobic activity 30 plus minutes a day, 3 days per week for initial exercise prescription. Provide home exercise prescription and guidelines that participant acknowledges understanding   prior to discharge. ° °Activity Barriers & Risk Stratification: °  °  °Activity Barriers & Cardiac Risk Stratification - 08/10/16 1213   °  ° Activity Barriers & Cardiac Risk Stratification  ° Activity Barriers Shortness of Breath;Deconditioning  ° Cardiac Risk Stratification Low  °  ° ° °6 Minute Walk: °  °  °6 Minute Walk   ° Row Name 08/10/16 1234 10/01/16 1056  °  °  ° 6 Minute Walk  ° Phase  -- Mid Program   ° Distance 1094 feet 1165 feet   ° Walk Time 6 minutes 6 minutes   ° # of Rest Breaks 0 0   ° MPH 2.07  --   ° METS 2.53 2.69   ° RPE 11 12   ° Perceived Dyspnea  3 3   ° VO2 Peak 7.78 9.42   ° Symptoms No  --   ° Resting HR 87 bpm 85 bpm   ° Resting BP 110/56 104/58   ° Max Ex. HR 103 bpm 98 bpm   ° Max Ex. BP  106/56 138/64   °  ° Interval HR  ° Baseline HR 87  --   ° 1 Minute HR 95 99   ° 2 Minute HR 101 98   ° 3 Minute HR 103 104   ° 4 Minute HR 103 97   ° 5 Minute HR 103 104   ° 6 Minute HR 103 98   ° 2 Minute Post HR 77 93   ° Interval Heart Rate? Yes  --   °  ° Interval Oxygen  ° Interval Oxygen? Yes  --   ° Baseline Oxygen Saturation % 97 % 97 %  °all values on room air   ° 1 Minute Oxygen Saturation % 97 % 97 %   ° 2 Minute Oxygen Saturation % 98 % 98 %   ° 3 Minute Oxygen Saturation % 98 % 96 %   ° 4 Minute Oxygen Saturation % 98 % 97 %   ° 5 Minute Oxygen Saturation % 96 % 95 %   ° 6 Minute Oxygen Saturation % 98 % 97 %   ° 2 Minute Post Oxygen Saturation % 99 % 98 %   °  ° ° °Initial Exercise Prescription: °  °  °Initial Exercise Prescription - 08/10/16 1200   °  ° Date of Initial Exercise RX and Referring Provider  ° Date 08/10/16  ° Referring Provider Ramaswamy  °  ° Treadmill  ° MPH 1.8  ° Grade 0.5  ° Minutes 15  ° METs 2.5  °  ° Recumbant Bike  ° Level 1  ° RPM 60  ° Minutes 15  ° METs 2  °  ° Recumbant Elliptical  ° Level 1  ° RPM 50  ° Minutes 15  ° METs 2  °  ° Elliptical  ° Level 1  ° Speed 2.5  ° Minutes 15  ° METs 2  °  ° Prescription Details  ° Frequency (times per week) 3  ° Duration Progress to 45 minutes of aerobic exercise without signs/symptoms of physical distress  °  ° Intensity  ° THRR 40-80% of Max Heartrate 106-125  ° Ratings of Perceived Exertion 11-15  ° Perceived Dyspnea 0-4  °  ° Progression  ° Progression Continue to progress workloads to maintain intensity without signs/symptoms of physical distress.  °  ° Resistance Training  ° Training Prescription Yes  ° Weight 2  °   Reps 10-12  °  ° ° °Perform Capillary Blood Glucose checks as needed. ° °Exercise Prescription Changes: °  °  °Exercise Prescription Changes   ° Row Name 08/16/16 1200 08/23/16 1200 08/24/16 1500 09/08/16 1300 09/23/16 1100  °  ° Exercise Review  ° Progression  --  -- Yes Yes Yes  °  ° Response to Exercise  ° Blood  Pressure (Admit) 126/60  -- 114/60 110/60 102/60  ° Blood Pressure (Exercise) 126/58  -- 124/82 132/64 136/70  ° Blood Pressure (Exit) 110/64  -- 122/70 90/52 102/48  ° Heart Rate (Admit) 78 bpm  -- 88 bpm 98 bpm 96 bpm  ° Heart Rate (Exercise) 109 bpm  -- 106 bpm 100 bpm 108 bpm  ° Heart Rate (Exit) 74 bpm  -- 79 bpm 74 bpm 86 bpm  ° Oxygen Saturation (Admit) 97 %  -- 96 % 98 % 98 %  ° Oxygen Saturation (Exercise) 94 %  -- 95 % 97 % 95 %  ° Oxygen Saturation (Exit) 98 %  -- 98 % 98 % 95 %  ° Rating of Perceived Exertion (Exercise) 13  -- 13 12 12  ° Perceived Dyspnea (Exercise) 3  -- 3 3 3  ° Duration  --  -- Progress to 45 minutes of aerobic exercise without signs/symptoms of physical distress Progress to 45 minutes of aerobic exercise without signs/symptoms of physical distress Progress to 45 minutes of aerobic exercise without signs/symptoms of physical distress  ° Intensity  --  -- THRR unchanged THRR unchanged THRR unchanged  °  ° Progression  ° Progression  --  --  --  -- Continue to progress workloads to maintain intensity without signs/symptoms of physical distress.  °  ° Resistance Training  ° Training Prescription Yes  -- Yes Yes Yes  ° Weight 2  -- 2 2 3  ° Reps 10-12  -- 10-12 10-12 10-12  °  ° Interval Training  ° Interval Training  --  -- No No No  °  ° Treadmill  ° MPH 1.8  -- 1.8 1.8 2.1  ° Grade 0.5  -- 0.5 0.5 1  ° Minutes 15  -- 15 15 15  ° METs 2.5  -- 2.5 2.5 2.9  °  ° Recumbant Bike  ° Level 1  --  --  --  --  ° RPM 60  --  --  --  --  ° Minutes 15  --  --  --  --  ° METs 2  --  --  --  --  °  ° NuStep  ° Level  --  -- 1 2 2  ° Minutes  --  -- 15 15 15  ° METs  --  --  -- 2  --  °  ° Recumbant Elliptical  ° Level 1  --  --  --  --  ° RPM 50  --  --  --  --  ° Minutes 15  --  --  --  --  ° METs 2  --  --  --  --  °  ° REL-XR  ° Level  --  -- 1 2 2  ° Minutes  --  -- 15 15 15  ° METs  --  --  -- 2.2 3.1  °  ° Home Exercise Plan  ° Plans to continue exercise at  -- Home  --  --  --  ° Frequency   -- Add 1 additional   day to program exercise sessions.  --  --  --   Row Name 10/07/16 1200             Exercise Review   Progression Yes         Response to Exercise   Blood Pressure (Admit) 114/68       Blood Pressure (Exercise) 126/64       Blood Pressure (Exit) 124/68       Heart Rate (Admit) 80 bpm       Heart Rate (Exercise) 121 bpm       Heart Rate (Exit) 74 bpm       Oxygen Saturation (Admit) 98 %       Oxygen Saturation (Exercise) 92 %       Oxygen Saturation (Exit) 98 %       Rating of Perceived Exertion (Exercise) 12       Perceived Dyspnea (Exercise) 3       Duration Progress to 45 minutes of aerobic exercise without signs/symptoms of physical distress       Intensity THRR unchanged         Progression   Progression Continue to progress workloads to maintain intensity without signs/symptoms of physical distress.         Resistance Training   Training Prescription Yes       Weight 3       Reps 10-12         Interval Training   Interval Training No         Treadmill   MPH 2       Grade 1       Minutes 15       METs 2.81         REL-XR   Level 2       Minutes 15       METs 3.1          Exercise Comments:     Exercise Comments    Row Name 08/16/16 1250 08/23/16 1253 08/24/16 1529 09/08/16 1338 09/23/16 1157   Exercise Comments First full day of exercise!  Patient was oriented to gym and equipment including functions, settings, policies, and procedures.  Patient's individual exercise prescription and treatment plan were reviewed.  All starting workloads were established based on the results of the 6 minute walk test done at initial orientation visit.  The plan for exercise progression was also introduced and progression will be customized based on patient's performance and goals. Deya likes the warm up and strength exercises.  Home exercise was explained and she verbalized understanding of HR, RPE and safety. Chee is progressing well in her first week  of exercise. Fusaye is progressing well with exercise and has added resistance to NS and XR. Avian is progressing well with exercise.   Row Name 10/07/16 1238           Exercise Comments Shineka is doing well with exercise progression.          Discharge Exercise Prescription (Final Exercise Prescription Changes):     Exercise Prescription Changes - 10/07/16 1200      Exercise Review   Progression Yes     Response to Exercise   Blood Pressure (Admit) 114/68   Blood Pressure (Exercise) 126/64   Blood Pressure (Exit) 124/68   Heart Rate (Admit) 80 bpm   Heart Rate (Exercise) 121 bpm   Heart Rate (Exit) 74 bpm   Oxygen Saturation (Admit) 98 %  Oxygen Saturation (Exercise) 92 %   Oxygen Saturation (Exit) 98 %   Rating of Perceived Exertion (Exercise) 12   Perceived Dyspnea (Exercise) 3   Duration Progress to 45 minutes of aerobic exercise without signs/symptoms of physical distress   Intensity THRR unchanged     Progression   Progression Continue to progress workloads to maintain intensity without signs/symptoms of physical distress.     Resistance Training   Training Prescription Yes   Weight 3   Reps 10-12     Interval Training   Interval Training No     Treadmill   MPH 2   Grade 1   Minutes 15   METs 2.81     REL-XR   Level 2   Minutes 15   METs 3.1       Nutrition:  Target Goals: Understanding of nutrition guidelines, daily intake of sodium '1500mg'$ , cholesterol '200mg'$ , calories 30% from fat and 7% or less from saturated fats, daily to have 5 or more servings of fruits and vegetables.  Biometrics:     Pre Biometrics - 08/10/16 1229      Pre Biometrics   Height 5' 8.25" (1.734 m)   Weight 118 lb 3.2 oz (53.6 kg)   Waist Circumference 26.75 inches   Hip Circumference 35.25 inches   Waist to Hip Ratio 0.76 %   BMI (Calculated) 17.9       Nutrition Therapy Plan and Nutrition Goals:   Nutrition Discharge: Rate Your Plate  Scores:   Psychosocial: Target Goals: Acknowledge presence or absence of depression, maximize coping skills, provide positive support system. Participant is able to verbalize types and ability to use techniques and skills needed for reducing stress and depression.  Initial Review & Psychosocial Screening:     Initial Psych Review & Screening - 08/10/16 [place]? Yes   Comments Ms Aikens has good support from her husband and 2 children. They live at [facility] and enjoy the retirement community life. Recently diagnosed with pulmonary fibrosis has been a challenge, and she is looking forward to the [place] program for disease management skills.     Barriers   Psychosocial barriers to participate in program There are no identifiable barriers or psychosocial needs.;The patient should benefit from training in stress management and relaxation.     Screening Interventions   Interventions Encouraged to exercise      Quality of Life Scores:     Quality of Life - 08/10/16 1231      Quality of Life Scores   Health/Function Pre 20.69 %   Socioeconomic Pre 20.83 %   Psych/Spiritual Pre 20.71 %   Family Pre 21 %   GLOBAL Pre 20.76 %      PHQ-9: Recent Review Flowsheet Data    Depression screen [facility] 2/9 08/10/2016   Decreased Interest 1   Down, Depressed, Hopeless 0   PHQ - 2 Score 1   Altered sleeping 1   Tired, decreased energy 3   Change in appetite 0   Feeling bad or failure about yourself  0   Trouble concentrating 0   Moving slowly or fidgety/restless 0   Suicidal thoughts 0   PHQ-9 Score 5      Psychosocial Evaluation and Intervention:     Psychosocial Evaluation - 08/16/16 1016      Psychosocial Evaluation & Interventions   Interventions Encouraged to exercise with the program and follow exercise prescription  Comments Counselor met with Ms. Vandervoort today for initial psychosocial evaluation.  She is an 83 year old who has  IPF and Bronchiectasis.  She has a strong support system with a spouse of 69 years and (2) adult children.  She also lives in a retirement community.  Ms. Brumley states she sleeps fine and her appetite has improved recently.  She reports a history of anxiety in the early 1970's subsequent to her spouse being in the [place] and her having two small children.  She denies any current symptoms of depression or anxiety and states she is typically in a positive mood.  Other than her health she states there is minimal stress in her life.  She has goals to increase her stamina and strength and be able to walk and stand without as much shortness of breath.  Ms. Bhavsar plans to more consistently exercise following this program by walking, which she enjoys, and working out in the retirement [employer] (which she does not enjoy!).  Staff will follow with Ms. Lauro throughout the course of this program.        Psychosocial Re-Evaluation:     Psychosocial Re-Evaluation    [place] Name 09/01/16 1121 10/11/16 1517           Psychosocial Re-Evaluation   Comments Counselor follow up with Ms. Joya Gaskins (Ms. W) today reporting she has realized how "out of shape" she was and is all the more committed to consistency in working out.  She hasn't recognized any progress yet as she has only been here two weeks and had to miss several days last week due to holidays and guests in her home.  Counselor will continue to follow. Curt Bears psychosocial assessment reveals no barriers at this time to participation in Pulmonary Rehab.  She has good family and friend support that encourages Carlynn to participate in [place] and progress with Her goals.  Lateka concerns are monitored, but she has acknowledge that attending the program has helped to maintain quality life with improved mobility, self-care, and emotional and financial stability.  Rajanae is commended for regular attendance and self-motivation to improve Her pulmonary  disease management. During a recent appointment with her pulmonologist, she was very encouraged that due to her good diet, exercise, attitude, and family support, her physician stated her lung disease was very manageable.        Education: Education Goals: Education classes will be provided on a weekly basis, covering required topics. Participant will state understanding/return demonstration of topics presented.  Learning Barriers/Preferences:     Learning Barriers/Preferences - 08/10/16 1214      Learning Barriers/Preferences   Learning Barriers None   Learning Preferences None      Education Topics: Initial Evaluation Education: - Verbal, written and demonstration of respiratory meds, RPE/PD scales, oximetry and breathing techniques. Instruction on use of nebulizers and MDIs: cleaning and proper use, rinsing mouth with steroid doses and importance of monitoring MDI activations. Flowsheet Row Pulmonary Rehab from 10/13/2016 in [facility] Cardiac and Pulmonary Rehab  Date  08/10/16  Educator  LB  Instruction Review Code  2- meets goals/outcomes      General Nutrition Guidelines/Fats and Fiber: -Group instruction provided by verbal, written material, models and posters to present the general guidelines for heart healthy nutrition. Gives an explanation and review of dietary fats and fiber. Flowsheet Row Pulmonary Rehab from 10/13/2016 in [facility] Cardiac and Pulmonary Rehab  Date  08/23/16  Educator  CR  Instruction Review Code  2-  meets goals/outcomes      Controlling Sodium/Reading Food Labels: -Group verbal and written material supporting the discussion of sodium use in heart healthy nutrition. Review and explanation with models, verbal and written materials for utilization of the food label. Flowsheet Row Pulmonary Rehab from 10/13/2016 in [facility] Cardiac and Pulmonary Rehab  Date  08/30/16  Educator  CR  Instruction Review Code  2- meets goals/outcomes      Exercise Physiology & Risk  Factors: - Group verbal and written instruction with models to review the exercise physiology of the cardiovascular system and associated critical values. Details cardiovascular disease risk factors and the goals associated with each risk factor. Flowsheet Row Pulmonary Rehab from 10/13/2016 in [facility] Cardiac and Pulmonary Rehab  Date  10/06/16  Educator  [facility]  Instruction Review Code  2- meets goals/outcomes      Aerobic Exercise & Resistance Training: - Gives group verbal and written discussion on the health impact of inactivity. On the components of aerobic and resistive training programs and the benefits of this training and how to safely progress through these programs.   Flexibility, Balance, General Exercise Guidelines: - Provides group verbal and written instruction on the benefits of flexibility and balance training programs. Provides general exercise guidelines with specific guidelines to those with heart or lung disease. Demonstration and skill practice provided. Flowsheet Row Pulmonary Rehab from 10/13/2016 in [facility] Cardiac and Pulmonary Rehab  Date  09/22/16  Educator  [facility]/AS  Instruction Review Code  2- meets goals/outcomes      Stress Management: - Provides group verbal and written instruction about the health risks of elevated stress, cause of high stress, and healthy ways to reduce stress.   Depression: - Provides group verbal and written instruction on the correlation between heart/lung disease and depressed mood, treatment options, and the stigmas associated with seeking treatment. Flowsheet Row Pulmonary Rehab from 10/13/2016 in [facility] Cardiac and Pulmonary Rehab  Date  09/06/16  Educator  [facility]  Instruction Review Code  2- meets goals/outcomes      Exercise & Equipment Safety: - Individual verbal instruction and demonstration of equipment use and safety with use of the equipment. Flowsheet Row Pulmonary Rehab from 10/13/2016 in [facility] Cardiac and Pulmonary Rehab  Date   08/16/16  Educator  AS  Instruction Review Code  2- meets goals/outcomes      Infection Prevention: - Provides verbal and written material to individual with discussion of infection control including proper hand washing and proper equipment cleaning during exercise session. Flowsheet Row Pulmonary Rehab from 10/13/2016 in [facility] Cardiac and Pulmonary Rehab  Date  08/16/16  Educator  AS  Instruction Review Code  2- meets goals/outcomes      Falls Prevention: - Provides verbal and written material to individual with discussion of falls prevention and safety. Flowsheet Row Pulmonary Rehab from 10/13/2016 in [facility] Cardiac and Pulmonary Rehab  Date  08/10/16  Educator  LB  Instruction Review Code  2- meets goals/outcomes      Diabetes: - Individual verbal and written instruction to review signs/symptoms of diabetes, desired ranges of glucose level fasting, after meals and with exercise. Advice that pre and post exercise glucose checks will be done for 3 sessions at entry of program.   Chronic Lung Diseases: - Group verbal and written instruction to review new updates, new respiratory medications, new advancements in procedures and treatments. Provide informative websites and "800" numbers of self-education.   Lung Procedures: - Group verbal and written instruction to describe testing methods  done to diagnose lung disease. Review the outcome of test results. Describe the treatment choices: Pulmonary Function Tests, ABGs and oximetry.   Energy Conservation: - Provide group verbal and written instruction for methods to conserve energy, plan and organize activities. Instruct on pacing techniques, use of adaptive equipment and posture/positioning to relieve shortness of breath.   Triggers: - Group verbal and written instruction to review types of environmental controls: home humidity, furnaces, filters, dust mite/pet prevention, HEPA vacuums. To discuss weather changes, air quality and the  benefits of nasal washing. Flowsheet Row Pulmonary Rehab from 10/13/2016 in [facility] Cardiac and Pulmonary Rehab  Date  09/29/16  Educator  LB  Instruction Review Code  2- meets goals/outcomes      Exacerbations: - Group verbal and written instruction to provide: warning signs, infection symptoms, calling MD promptly, preventive modes, and value of vaccinations. Review: effective airway clearance, coughing and/or vibration techniques. Create an Sport and exercise psychologist. Flowsheet Row Pulmonary Rehab from 10/13/2016 in [facility] Cardiac and Pulmonary Rehab  Date  09/08/16  Educator  LB  Instruction Review Code  2- meets goals/outcomes      Oxygen: - Individual and group verbal and written instruction on oxygen therapy. Includes supplement oxygen, available portable oxygen systems, continuous and intermittent flow rates, oxygen safety, concentrators, and Medicare reimbursement for oxygen.   Respiratory Medications: - Group verbal and written instruction to review medications for lung disease. Drug class, frequency, complications, importance of spacers, rinsing mouth after steroid MDI's, and proper cleaning methods for nebulizers. Flowsheet Row Pulmonary Rehab from 10/13/2016 in [facility] Cardiac and Pulmonary Rehab  Date  08/10/16  Educator  LB  Instruction Review Code  2- meets goals/outcomes      AED/CPR: - Group verbal and written instruction with the use of models to demonstrate the basic use of the AED with the basic ABC's of resuscitation.   Breathing Retraining: - Provides individuals verbal and written instruction on purpose, frequency, and proper technique of diaphragmatic breathing and pursed-lipped breathing. Applies individual practice skills. Flowsheet Row Pulmonary Rehab from 10/13/2016 in [facility] Cardiac and Pulmonary Rehab  Date  08/16/16  Educator  AS  Instruction Review Code  2- meets goals/outcomes      Anatomy and Physiology of the Lungs: - Group verbal and written instruction with the  use of models to provide basic lung anatomy and physiology related to function, structure and complications of lung disease. Flowsheet Row Pulmonary Rehab from 10/13/2016 in [facility] Cardiac and Pulmonary Rehab  Date  09/01/16  Educator  LB  Instruction Review Code  2- meets goals/outcomes      Heart Failure: - Group verbal and written instruction on the basics of heart failure: signs/symptoms, treatments, explanation of ejection fraction, enlarged heart and cardiomyopathy. Flowsheet Row Pulmonary Rehab from 10/13/2016 in [facility] Cardiac and Pulmonary Rehab  Date  09/17/16  Educator  SB  Instruction Review Code  2- meets goals/outcomes      Sleep Apnea: - Individual verbal and written instruction to review Obstructive Sleep Apnea. Review of risk factors, methods for diagnosing and types of masks and machines for OSA.   Anxiety: - Provides group, verbal and written instruction on the correlation between heart/lung disease and anxiety, treatment options, and management of anxiety. Flowsheet Row Pulmonary Rehab from 10/13/2016 in [facility] Cardiac and Pulmonary Rehab  Date  10/13/16  Educator  [facility]  Instruction Review Code  2- Meets goals/outcomes      Relaxation: - Provides group, verbal and written instruction about the benefits of relaxation  for patients with heart/lung disease. Also provides patients with examples of relaxation techniques.   Knowledge Questionnaire Score:     Knowledge Questionnaire Score - 08/10/16 1214      Knowledge Questionnaire Score   Pre Score 9/10       Core Components/Risk Factors/Patient Goals at Admission:     Personal Goals and Risk Factors at Admission - 08/10/16 1231      Core Components/Risk Factors/Patient Goals on Admission   Intervention --  Ms Keahey would like to meet with the dietitian. She watches her diet and adds protein. Her BMI is 17.9 and normal range for her would be 18-24.   Goal Weight: Long Term 130 lb (59 kg)      Core  Components/Risk Factors/Patient Goals Review:      Goals and Risk Factor Review    Row Name 08/16/16 1245 08/23/16 1251 09/08/16 1345 09/13/16 1535 10/11/16 1502     Core Components/Risk Factors/Patient Goals Review   Personal Goals Review Develop more efficient breathing techniques such as purse lipped breathing and diaphragmatic breathing and practicing self-pacing with activity. Sedentary;Increase Strength and Stamina Sedentary;Increase Strength and Stamina;Develop more efficient breathing techniques such as purse lipped breathing and diaphragmatic breathing and practicing self-pacing with activity.;Improve shortness of breath with ADL's;Increase knowledge of respiratory medications and ability to use respiratory devices properly. Increase knowledge of respiratory medications and ability to use respiratory devices properly. Sedentary;Increase Strength and Stamina;Improve shortness of breath with ADL's;Increase knowledge of respiratory medications and ability to use respiratory devices properly.;Weight Management/Obesity;Develop more efficient breathing techniques such as purse lipped breathing and diaphragmatic breathing and practicing self-pacing with activity.   Review Pursed lip breathing technique was discussed with the patient and how to use it with exercise and ADL's. Patient demonstrated understanding of this technique.  Kiarrah has not been sore from beginning exercise.  She likes the warm up and strength exercises done in class. Her husband encourages her to exercise. Ms Larmore has an appointment with her pulmonologist tomorrow. I sent with her her ITP for him to see her progress with her exercise goals. Ms Richer is so motivated to learn as much as she can about her bronchectasis and pulmonary fibrosis, so I did give her educational material about these diseases. Ms hegwood is interested in meeting with the dietitian and has been increasing her protein with Ensure. She rarely uses her Albuterol.,  but performs the PLB which is helpful with activity. I talked to her about the [place] for use with bronchiectasis, and she is planning to talk to her physician about this therapy. Ms Pinela was recently order Spiriva by Dr Marchelle Gearing. He also ordered blood work, echo, and a CT of her lungs. She will also have an appointment with Dr Sung Amabile for a local pulmonologist. Ms Reist had a new appointment with Dr Sung Amabile. Her cath was negative for heart disease, and her CT of her lungs was reviewed by Dr Sung Amabile. She has a repeat CT on 11/06/16. The physician was very positive about her pul fibrosis and bronchiectasis. Mr Vachon takes care of herself by diet, exercise, and eatting properly. Ms Doody is to gain weight, but prefers not to meet with the dietitian. She has added a protein drink to her diet. She has a good understanding of her Spiriva and Albuterol MDI. She uses PLB with good technique. She attends regularly and enjoys the education.   Expected Outcomes Patient will use pursed lip breathing to help control SOB during exercise and ADL's.  Samara Deist  will continue to build strength and stamina. Continue to increase her exercise goals and learn more disease management skills. Take her new MDI, Spiriva correctly. Continue progressing with exercise goals in [facility] and increase education to manage her Pul Fibrosis and Bronchiectasis.      Core Components/Risk Factors/Patient Goals at Discharge (Final Review):      Goals and Risk Factor Review - 10/11/16 1502      Core Components/Risk Factors/Patient Goals Review   Personal Goals Review Sedentary;Increase Strength and Stamina;Improve shortness of breath with ADL's;Increase knowledge of respiratory medications and ability to use respiratory devices properly.;Weight Management/Obesity;Develop more efficient breathing techniques such as purse lipped breathing and diaphragmatic breathing and practicing self-pacing with activity.   Review Ms Horton had a new  appointment with Dr Alva Garnet. Her cath was negative for heart disease, and her CT of her lungs was reviewed by Dr Alva Garnet. She has a repeat CT on 11/06/16. The physician was very positive about her pul fibrosis and bronchiectasis. Mr Desanctis takes care of herself by diet, exercise, and eatting properly. Ms Savino is to gain weight, but prefers not to meet with the dietitian. She has added a protein drink to her diet. She has a good understanding of her Spiriva and Albuterol MDI. She uses PLB with good technique. She attends regularly and enjoys the education.   Expected Outcomes Continue progressing with exercise goals in [facility] and increase education to manage her Pul Fibrosis and Bronchiectasis.      ITP Comments:     ITP Comments    Row Name 09/03/16 1102           ITP Comments "Know Your Numbers" education was completed today with learning goals met.           Comments: 30 day note review

## 2016-10-18 NOTE — Progress Notes (Signed)
Daily Session Note  Patient Details  Name: Crystal Gregory MRN: [id number] Date of Birth: [birth year] Referring Provider:   Flowsheet Row Pulmonary Rehab from 08/10/2016 in [facility] Cardiac and Pulmonary Rehab  Referring Provider  Ramaswamy      Encounter Date: 10/18/2016  Check In:     Session Check In - 10/18/16 1135      Check-In   Location [facility]-Cardiac & Pulmonary Rehab   Staff Present Carson Myrtle, BS, RRT, Respiratory Dareen Piano, BA, ACSM CEP, Exercise Physiologist;Torey Reinard Amedeo Plenty, BS, ACSM CEP, Exercise Physiologist   Supervising physician immediately available to respond to emergencies [facility] immediately available ER MD   Physician(s) Drs. McShane and Kinner   Medication changes reported     No   Fall or balance concerns reported    No   Warm-up and Cool-down Performed on first and last piece of equipment   Resistance Training Performed Yes   VAD Patient? No     Pain Assessment   Currently in Pain? No/denies   Multiple Pain Sites No         Goals Met:  Proper associated with RPD/PD & O2 Sat Independence with exercise equipment Exercise tolerated well Strength training completed today  Goals Unmet:  Not Applicable  Comments: Pt able to follow exercise prescription today without complaint.  Will continue to monitor for progression.    Dr. Emily Filbert is Medical Director for [place] and [facility].

## 2016-10-22 DIAGNOSIS — J479 Bronchiectasis, uncomplicated: Secondary | ICD-10-CM

## 2016-10-22 DIAGNOSIS — J8410 Pulmonary fibrosis, unspecified: Secondary | ICD-10-CM

## 2016-10-22 DIAGNOSIS — J849 Interstitial pulmonary disease, unspecified: Secondary | ICD-10-CM | POA: Diagnosis not present

## 2016-10-22 NOTE — Progress Notes (Signed)
Daily Session Note  Patient Details  Name: Crystal Gregory MRN: [id number] Date of Birth: 11/28/1931 Referring Provider:   Flowsheet Row Pulmonary Rehab from 08/10/2016 in [facility] Cardiac and Pulmonary Rehab  Referring Provider  Ramaswamy      Encounter Date: 10/22/2016  Check In:     Session Check In - 10/22/16 1155      Check-In   Location [facility]-Cardiac & Pulmonary Rehab   Staff Present Amanda Sommer, BA, ACSM CEP, Exercise Physiologist;  RN BSN   Supervising physician immediately available to respond to emergencies [facility] immediately available ER MD   Physician(s) Drs. Yao & Malinda   Medication changes reported     No   Fall or balance concerns reported    No   Warm-up and Cool-down Performed as group-led instruction   Resistance Training Performed Yes   VAD Patient? No     Pain Assessment   Currently in Pain? No/denies   Multiple Pain Sites No           Exercise Prescription Changes - 10/22/16 1000      Exercise Review   Progression Yes     Response to Exercise   Blood Pressure (Admit) 128/60   Blood Pressure (Exercise) 142/84   Blood Pressure (Exit) 114/60   Heart Rate (Admit) 75 bpm   Heart Rate (Exercise) 103 bpm   Heart Rate (Exit) 73 bpm   Oxygen Saturation (Admit) 99 %   Oxygen Saturation (Exercise) 97 %   Oxygen Saturation (Exit) 99 %   Rating of Perceived Exertion (Exercise) 12   Perceived Dyspnea (Exercise) 3   Duration Progress to 45 minutes of aerobic exercise without signs/symptoms of physical distress   Intensity THRR unchanged     Progression   Progression Continue to progress workloads to maintain intensity without signs/symptoms of physical distress.     Resistance Training   Training Prescription Yes   Weight 3   Reps 10-12     Interval Training   Interval Training No     Treadmill   MPH 2.1   Grade 1.5   Minutes 15   METs 3.04     NuStep   Level 2   Minutes 15   METs 2.1      Goals Met:  Proper  associated with RPD/PD & O2 Sat Independence with exercise equipment Using PLB without cueing & demonstrates good technique Exercise tolerated well No report of cardiac concerns or symptoms Strength training completed today  Goals Unmet:  Not Applicable  Comments: Pt able to follow exercise prescription today without complaint.  Will continue to monitor for progression.    Dr. Mark Miller is Medical Director for [facility] and [facility]. 

## 2016-10-25 ENCOUNTER — Encounter: Payer: Medicare Other | Admitting: *Deleted

## 2016-10-25 DIAGNOSIS — J479 Bronchiectasis, uncomplicated: Secondary | ICD-10-CM

## 2016-10-25 DIAGNOSIS — J849 Interstitial pulmonary disease, unspecified: Secondary | ICD-10-CM | POA: Diagnosis not present

## 2016-10-25 DIAGNOSIS — J8410 Pulmonary fibrosis, unspecified: Secondary | ICD-10-CM

## 2016-10-25 NOTE — Progress Notes (Signed)
Daily Session Note  Patient Details  Name: Crystal Gregory MRN: [id number] Date of Birth: March 18, 1932 Referring Provider:   Flowsheet Row Pulmonary Rehab from 08/10/2016 in [facility] Cardiac and Pulmonary Rehab  Referring Provider  Ramaswamy      Encounter Date: 10/25/2016  Check In:     Session Check In - 10/25/16 1156      Check-In   Location [facility]-Cardiac & Pulmonary Rehab   Staff Present Nada Maclachlan, BA, ACSM CEP, Exercise Physiologist;Laureen Owens Shark, BS, RRT, Respiratory Bertis Ruddy, BS, ACSM CEP, Exercise Physiologist   Supervising physician immediately available to respond to emergencies [facility] immediately available ER MD   Physician(s) Drs. Mariea Clonts and Cordry [place]   Medication changes reported     No   Fall or balance concerns reported    No   Warm-up and Cool-down Performed on first and last piece of equipment   Resistance Training Performed Yes   VAD Patient? No     Pain Assessment   Currently in Pain? No/denies   Multiple Pain Sites No         Goals Met:  Proper associated with RPD/PD & O2 Sat Independence with exercise equipment Exercise tolerated well Strength training completed today  Goals Unmet:  Not Applicable  Comments: Pt able to follow exercise prescription today without complaint.  Will continue to monitor for progression.    Dr. Emily Filbert is Medical Director for [place] and [facility].

## 2016-10-27 DIAGNOSIS — J8410 Pulmonary fibrosis, unspecified: Secondary | ICD-10-CM

## 2016-10-27 DIAGNOSIS — J479 Bronchiectasis, uncomplicated: Secondary | ICD-10-CM

## 2016-10-27 DIAGNOSIS — J849 Interstitial pulmonary disease, unspecified: Secondary | ICD-10-CM | POA: Diagnosis not present

## 2016-10-27 NOTE — Progress Notes (Signed)
Daily Session Note  Patient Details  Name: Crystal Gregory MRN: [id number] Date of Birth: 1931/12/05 Referring Provider:   Flowsheet Row Pulmonary Rehab from 08/10/2016 in [facility] Cardiac and Pulmonary Rehab  Referring Provider  Ramaswamy      Encounter Date: 10/27/2016  Check In:     Session Check In - 10/27/16 1226      Check-In   Location [facility]-Cardiac & Pulmonary Rehab   Staff Present Alberteen Sam, MA, ACSM RCEP, Exercise Physiologist;Mishon Blubaugh Oletta Darter, BA, ACSM CEP, Exercise Physiologist;Laureen Owens Shark, BS, RRT, Respiratory Therapist   Supervising physician immediately available to respond to emergencies [facility] immediately available ER MD   Physician(s) Cinda [place] and Lord   Medication changes reported     No   Fall or balance concerns reported    No   Warm-up and Cool-down Performed as group-led Location manager Performed Yes   VAD Patient? No     Pain Assessment   Currently in Pain? No/denies         Goals Met:  Proper associated with RPD/PD & O2 Sat Independence with exercise equipment Exercise tolerated well Strength training completed today  Goals Unmet:  Not Applicable  Comments: Pt able to follow exercise prescription today without complaint.  Will continue to monitor for progression.    Dr. Emily Filbert is Medical Director for [place] and [facility]monary [facility].

## 2016-10-28 ENCOUNTER — Ambulatory Visit: Payer: Medicare Other | Admitting: Pulmonary Disease

## 2016-10-29 ENCOUNTER — Encounter: Payer: Medicare Other | Admitting: *Deleted

## 2016-10-29 DIAGNOSIS — J849 Interstitial pulmonary disease, unspecified: Secondary | ICD-10-CM | POA: Diagnosis not present

## 2016-10-29 DIAGNOSIS — J479 Bronchiectasis, uncomplicated: Secondary | ICD-10-CM

## 2016-10-29 DIAGNOSIS — J8410 Pulmonary fibrosis, unspecified: Secondary | ICD-10-CM

## 2016-10-29 LAB — GLUCOSE, CAPILLARY: GLUCOSE-CAPILLARY: 111 mg/dL — AB (ref 65–99)

## 2016-10-29 NOTE — Progress Notes (Signed)
Daily Session Note  Patient Details  Name: Crystal Gregory MRN: [id number] Date of Birth: Feb 28, 1932 Referring Provider:   Flowsheet Row Pulmonary Rehab from 08/10/2016 in [facility] Cardiac and Pulmonary Rehab  Referring Provider  Ramaswamy      Encounter Date: 10/29/2016  Check In:     Session Check In - 10/29/16 1033      Check-In   Location [facility]-Cardiac & Pulmonary Rehab   Staff Present Gerlene Burdock, RN, Vickki Hearing, BA, ACSM CEP, Exercise Physiologist;Izaiyah Kleinman RN BSN   Supervising physician immediately available to respond to emergencies [facility] immediately available ER MD   Physician(s) Drs. Quentin Cornwall and Paduchowski   Medication changes reported     No   Fall or balance concerns reported    No   Warm-up and Cool-down Performed as group-led Location manager Performed Yes   VAD Patient? No     Pain Assessment   Currently in Pain? No/denies   Multiple Pain Sites No         Goals Met:  Proper associated with RPD/PD & O2 Sat Independence with exercise equipment Using PLB without cueing & demonstrates good technique Exercise tolerated well No report of cardiac concerns or symptoms Strength training completed today  Goals Unmet:  Not Applicable  Comments: Pt able to follow exercise prescription today without complaint.  Will continue to monitor for progression.    Dr. Emily Filbert is Medical Director for [place] and [facility].

## 2016-11-01 ENCOUNTER — Encounter: Payer: Medicare Other | Admitting: *Deleted

## 2016-11-01 DIAGNOSIS — J8410 Pulmonary fibrosis, unspecified: Secondary | ICD-10-CM

## 2016-11-01 DIAGNOSIS — J849 Interstitial pulmonary disease, unspecified: Secondary | ICD-10-CM | POA: Diagnosis not present

## 2016-11-01 DIAGNOSIS — J479 Bronchiectasis, uncomplicated: Secondary | ICD-10-CM

## 2016-11-01 NOTE — Progress Notes (Signed)
Daily Session Note  Patient Details  Name: Crystal Gregory MRN: [id number] Date of Birth: 10-Sep-1932 Referring Provider:   Flowsheet Row Pulmonary Rehab from 08/10/2016 in [facility] Cardiac and Pulmonary Rehab  Referring Provider  Ramaswamy      Encounter Date: 11/01/2016  Check In:     Session Check In - 11/01/16 1016      Check-In   Location [facility]-Cardiac & Pulmonary Rehab   Staff Present Nada Maclachlan, BA, ACSM CEP, Exercise Physiologist;Laureen Owens Shark, BS, RRT, Respiratory Bertis Ruddy, BS, ACSM CEP, Exercise Physiologist   Supervising physician immediately available to respond to emergencies [facility] immediately available ER MD   Physician(s) Drs. Kinner and Paduchowski   Medication changes reported     No   Fall or balance concerns reported    No   Warm-up and Cool-down Performed on first and last piece of equipment   Resistance Training Performed Yes   VAD Patient? No     Pain Assessment   Currently in Pain? No/denies   Multiple Pain Sites No         Goals Met:  Proper associated with RPD/PD & O2 Sat Independence with exercise equipment Exercise tolerated well Personal goals reviewed Strength training completed today  Goals Unmet:  Not Applicable  Comments: Pt able to follow exercise prescription today without complaint.  Will continue to monitor for progression.    Dr. Emily Filbert is Medical Director for [place] and [facility].

## 2016-11-03 ENCOUNTER — Encounter: Payer: Medicare Other | Admitting: *Deleted

## 2016-11-03 DIAGNOSIS — J8410 Pulmonary fibrosis, unspecified: Secondary | ICD-10-CM

## 2016-11-03 DIAGNOSIS — J479 Bronchiectasis, uncomplicated: Secondary | ICD-10-CM

## 2016-11-03 DIAGNOSIS — J849 Interstitial pulmonary disease, unspecified: Secondary | ICD-10-CM | POA: Diagnosis not present

## 2016-11-03 NOTE — Progress Notes (Signed)
Daily Session Note  Patient Details  Name: Crystal Gregory MRN: [id number] Date of Birth: [birth year] Referring Provider:   Flowsheet Row Pulmonary Rehab from 08/10/2016 in [facility] Cardiac and Pulmonary Rehab  Referring Provider  Ramaswamy      Encounter Date: 11/03/2016  Check In:     Session Check In - 11/03/16 1133      Check-In   Location [facility]-Cardiac & Pulmonary Rehab   Staff Present Carson Myrtle, BS, RRT, Respiratory Lennie Hummer, MA, ACSM RCEP, Exercise Physiologist;Amanda Oletta Darter, BA, ACSM CEP, Exercise Physiologist;Daelynn Blower RN BSN   Supervising physician immediately available to respond to emergencies [facility] immediately available ER MD   Physician(s) Drs. Malinda and Williams   Medication changes reported     No   Fall or balance concerns reported    No   Warm-up and Cool-down Performed as group-led Location manager Performed Yes   VAD Patient? No     Pain Assessment   Currently in Pain? No/denies   Multiple Pain Sites No         Goals Met:  Proper associated with RPD/PD & O2 Sat Independence with exercise equipment Using PLB without cueing & demonstrates good technique Exercise tolerated well Strength training completed today  Goals Unmet:  Not Applicable  Comments: Pt able to follow exercise prescription today without complaint.  Will continue to monitor for progression.    Dr. Emily Filbert is Medical Director for [place] and [facility].

## 2016-11-04 ENCOUNTER — Ambulatory Visit
Admission: RE | Admit: 2016-11-04 | Discharge: 2016-11-04 | Disposition: A | Discharge status: Home / Self Care | Payer: Medicare Other | Source: Ambulatory Visit | Attending: Internal Medicine | Admitting: Internal Medicine

## 2016-11-04 DIAGNOSIS — I2510 Atherosclerotic heart disease of native coronary artery without angina pectoris: Secondary | ICD-10-CM | POA: Diagnosis not present

## 2016-11-04 DIAGNOSIS — Z8709 Personal history of other diseases of the respiratory system: Secondary | ICD-10-CM | POA: Diagnosis not present

## 2016-11-04 DIAGNOSIS — R0600 Dyspnea, unspecified: Secondary | ICD-10-CM | POA: Diagnosis not present

## 2016-11-04 DIAGNOSIS — I700 Atherosclerosis of aorta: Secondary | ICD-10-CM | POA: Insufficient documentation

## 2016-11-04 DIAGNOSIS — R0689 Other abnormalities of breathing: Secondary | ICD-10-CM | POA: Insufficient documentation

## 2016-11-04 DIAGNOSIS — J849 Interstitial pulmonary disease, unspecified: Secondary | ICD-10-CM

## 2016-11-05 ENCOUNTER — Telehealth: Payer: Self-pay | Admitting: Internal Medicine

## 2016-11-05 ENCOUNTER — Encounter: Payer: Medicare Other | Attending: Internal Medicine | Admitting: *Deleted

## 2016-11-05 DIAGNOSIS — J849 Interstitial pulmonary disease, unspecified: Secondary | ICD-10-CM | POA: Diagnosis not present

## 2016-11-05 DIAGNOSIS — J8410 Pulmonary fibrosis, unspecified: Secondary | ICD-10-CM

## 2016-11-05 NOTE — Progress Notes (Signed)
Daily Session Note  Patient Details  Name: Crystal Gregory MRN: [id number] Date of Birth: 25-Apr-1932 Referring Provider:   Flowsheet Row Pulmonary Rehab from 08/10/2016 in [facility] Cardiac and Pulmonary Rehab  Referring Provider  Ramaswamy      Encounter Date: 11/05/2016  Check In:     Session Check In - 11/05/16 1021      Check-In   Location [facility]-Cardiac & Pulmonary Rehab   Staff Present Gerlene Burdock, RN, Vickki Hearing, BA, ACSM CEP, Exercise Physiologist;Patricia Surles RN BSN   Supervising physician immediately available to respond to emergencies [facility] immediately available ER MD   Physician(s) Dr. Jimmye Norman and Dr. Marcelene [place]te   Medication changes reported     No   Fall or balance concerns reported    No   Warm-up and Cool-down Performed on first and last piece of equipment   Resistance Training Performed Yes   VAD Patient? No     Pain Assessment   Currently in Pain? No/denies           Exercise Prescription Changes - 11/04/16 1200      Response to Exercise   Blood Pressure (Admit) 98/62   Blood Pressure (Exercise) 120/60   Blood Pressure (Exit) 90/46   Heart Rate (Admit) 90 bpm   Heart Rate (Exercise) 104 bpm   Heart Rate (Exit) 78 bpm   Oxygen Saturation (Admit) 97 %   Oxygen Saturation (Exercise) 96 %   Oxygen Saturation (Exit) 98 %   Rating of Perceived Exertion (Exercise) 12   Perceived Dyspnea (Exercise) 2   Duration Progress to 45 minutes of aerobic exercise without signs/symptoms of physical distress   Intensity THRR unchanged     Progression   Progression Continue to progress workloads to maintain intensity without signs/symptoms of physical distress.     Resistance Training   Training Prescription Yes   Weight 3   Reps 10-12     Interval Training   Interval Training No     Treadmill   MPH 2.1   Grade 1.5   Minutes 15   METs 3.04     NuStep   Level 2   Minutes 15   METs 2.1      Goals Met:  Proper associated with RPD/PD & O2  Sat Exercise tolerated well  Goals Unmet:  Not Applicable  Comments:     Dr. Emily Filbert is Medical Director for [place] and [facility].

## 2016-11-05 NOTE — Telephone Encounter (Signed)
  CT shows stable fibrosis - UIP pattern . Will discuss mid feb 2018 visit. Is she off the nitrafurantoin?  Dr. Brand Males, M.D., [facility].C.P Pulmonary and Critical Care Medicine Staff Physician [place] [facility] Pager: [phone], If no answer or between  15:00h - 7:00h: call [phone]  11/05/2016 8:50 AM     Ct Chest High Resolution  Result Date: 11/04/2016 CLINICAL DATA:  83 year old female with history of interstitial lung disease. Followup study. EXAM: CT CHEST WITHOUT CONTRAST TECHNIQUE: Multidetector CT imaging of the chest was performed following the standard protocol without intravenous contrast. High resolution imaging of the lungs, as well as inspiratory and expiratory imaging, was performed. COMPARISON:  Chest CT 06/11/2016. FINDINGS: Cardiovascular: Heart size is normal. There is no significant pericardial fluid, thickening or pericardial calcification. There is aortic atherosclerosis, as well as atherosclerosis of the great vessels of the mediastinum and the coronary arteries, including calcified atherosclerotic plaque in the left anterior descending and right coronary arteries. Mediastinum/Nodes: No pathologically enlarged mediastinal or hilar lymph nodes. Please note that accurate exclusion of hilar adenopathy is limited on noncontrast CT scans. Esophagus is unremarkable in appearance. No axillary lymphadenopathy. Lungs/Pleura: High-resolution images demonstrate extensive areas of ground-glass attenuation, septal thickening, subpleural reticulation, cylindrical and varicose traction bronchiectasis, and extensive honeycombing throughout the lungs bilaterally. These findings have a definitive craniocaudal gradient. No significant progression compared to the prior study 06/11/2016. Inspiratory and expiratory imaging is unremarkable. No acute consolidative airspace disease. No pleural effusions. No definite suspicious appearing pulmonary nodules  or masses. Upper Abdomen: Aortic atherosclerosis. Musculoskeletal: There are no aggressive appearing lytic or blastic lesions noted in the visualized portions of the skeleton. IMPRESSION: 1. Spectrum of findings is again compatible with interstitial lung disease, and considered diagnostic of usual interstitial pneumonia (UIP) from an imaging standpoint. There has been no significant progression of disease compared to the recent prior examination. 2. Aortic atherosclerosis, in addition to 2 vessel coronary artery disease. Please note that although the presence of coronary artery calcium documents the presence of coronary artery disease, the severity of this disease and any potential stenosis cannot be assessed on this non-gated CT examination. Assessment for potential risk factor modification, dietary therapy or pharmacologic therapy may be warranted, if clinically indicated. Electronically Signed   By: Vinnie Langton M.D.   On: 11/04/2016 12:03

## 2016-11-08 DIAGNOSIS — J479 Bronchiectasis, uncomplicated: Secondary | ICD-10-CM

## 2016-11-08 DIAGNOSIS — J8410 Pulmonary fibrosis, unspecified: Secondary | ICD-10-CM

## 2016-11-08 DIAGNOSIS — J849 Interstitial pulmonary disease, unspecified: Secondary | ICD-10-CM | POA: Diagnosis not present

## 2016-11-08 NOTE — Progress Notes (Signed)
Daily Session Note  Patient Details  Name: Crystal Gregory MRN: [id number] Date of Birth: October 17, 1931 Referring Provider:   Flowsheet Row Pulmonary Rehab from 08/10/2016 in [facility] Cardiac and Pulmonary Rehab  Referring Provider  Ramaswamy      Encounter Date: 11/08/2016  Check In:     Session Check In - 11/08/16 1226      Check-In   Location [facility]-Cardiac & Pulmonary Rehab   Staff Present Carson Myrtle, BS, RRT, Respiratory Therapist;Kelly Amedeo Plenty, BS, ACSM CEP, Exercise Physiologist;Amanda Oletta Darter, BA, ACSM CEP, Exercise Physiologist   Supervising physician immediately available to respond to emergencies [facility] immediately available ER MD   Physician(s) Jimmye Norman and Corky Downs   Medication changes reported     No   Fall or balance concerns reported    No   Warm-up and Cool-down Performed as group-led instruction   Resistance Training Performed Yes   VAD Patient? No     Pain Assessment   Currently in Pain? No/denies   Multiple Pain Sites No         Goals Met:  Proper associated with RPD/PD & O2 Sat Independence with exercise equipment Exercise tolerated well Strength training completed today  Goals Unmet:  Not Applicable  Comments: Pt able to follow exercise prescription today without complaint.  Will continue to monitor for progression.    Dr. Emily Filbert is Medical Director for [place] and [facility].

## 2016-11-08 NOTE — Telephone Encounter (Signed)
Called and spoke to pt. Informed her of the results per MR. Pt verbalized understanding.   MR pt is no longer taking the nitrafurantoin.

## 2016-11-08 NOTE — Progress Notes (Signed)
Curt Bears psychosocial assessment reveals no barriers at this time to participation in Pulmonary Rehab.  she has good family and friend support that encourages Zamara to participate in [place] and progress with Her goals.  Mishal concerns are monitored, but she has acknowledge that attending the program has helped to maintain quality life with improved mobility, self-care, and emotional and financial stability.  Sissy is commended for regular attendance and self-motivation to improve Her pulmonary disease management.

## 2016-11-10 ENCOUNTER — Encounter: Payer: Medicare Other | Admitting: *Deleted

## 2016-11-10 DIAGNOSIS — J8410 Pulmonary fibrosis, unspecified: Secondary | ICD-10-CM

## 2016-11-10 DIAGNOSIS — J849 Interstitial pulmonary disease, unspecified: Secondary | ICD-10-CM | POA: Diagnosis not present

## 2016-11-10 DIAGNOSIS — J479 Bronchiectasis, uncomplicated: Secondary | ICD-10-CM

## 2016-11-10 NOTE — Progress Notes (Signed)
Daily Session Note  Patient Details  Name: Crystal Gregory MRN: [id number] Date of Birth: [birth year] Referring Provider:   Flowsheet Row Pulmonary Rehab from 08/10/2016 in [facility] Cardiac and Pulmonary Rehab  Referring Provider  Ramaswamy      Encounter Date: 11/10/2016  Check In:     Session Check In - 11/10/16 1009      Check-In   Location [facility]-Cardiac & Pulmonary Rehab   Staff Present Alberteen Sam, MA, ACSM RCEP, Exercise Physiologist;Laureen Owens Shark, BS, RRT, Respiratory Dareen Piano, BA, ACSM CEP, Exercise Physiologist   Supervising physician immediately available to respond to emergencies [facility] immediately available ER MD   Physician(s) Drs. Malinda and Robinson   Medication changes reported     No   Fall or balance concerns reported    No   Warm-up and Cool-down Performed as group-led Location manager Performed Yes   VAD Patient? No     Pain Assessment   Currently in Pain? No/denies   Multiple Pain Sites No         Goals Met:  Proper associated with RPD/PD & O2 Sat Independence with exercise equipment Using PLB without cueing & demonstrates good technique Exercise tolerated well Strength training completed today  Goals Unmet:  Not Applicable  Comments: Pt able to follow exercise prescription today without complaint.  Will continue to monitor for progression.    Dr. Emily Filbert is Medical Director for [place] and [facility]monary Rehabilitation.

## 2016-11-12 DIAGNOSIS — J479 Bronchiectasis, uncomplicated: Secondary | ICD-10-CM

## 2016-11-12 DIAGNOSIS — J8410 Pulmonary fibrosis, unspecified: Secondary | ICD-10-CM

## 2016-11-12 DIAGNOSIS — J849 Interstitial pulmonary disease, unspecified: Secondary | ICD-10-CM | POA: Diagnosis not present

## 2016-11-12 NOTE — Progress Notes (Signed)
Daily Session Note  Patient Details  Name: Crystal Gregory MRN: [id number] Date of Birth: 1932-01-20 Referring Provider:   Flowsheet Row Pulmonary Rehab from 08/10/2016 in [facility] Cardiac and Pulmonary Rehab  Referring Provider  Ramaswamy      Encounter Date: 11/12/2016  Check In:     Session Check In - 11/12/16 1026      Check-In   Location [facility]-Cardiac & Pulmonary Rehab   Staff Present Gerlene Burdock, RN, Vickki Hearing, BA, ACSM CEP, Exercise Physiologist;Suzan Manon RN BSN   Supervising physician immediately available to respond to emergencies [facility] immediately available ER MD   Physician(s) Drs. Schaevitz and Williams   Medication changes reported     No   Fall or balance concerns reported    No   Warm-up and Cool-down Performed as group-led Location manager Performed Yes   VAD Patient? No     Pain Assessment   Currently in Pain? No/denies   Multiple Pain Sites No         Goals Met:  Proper associated with RPD/PD & O2 Sat Independence with exercise equipment Using PLB without cueing & demonstrates good technique Exercise tolerated well No report of cardiac concerns or symptoms Strength training completed today  Goals Unmet:  Not Applicable  Comments: Pt able to follow exercise prescription today without complaint.  Will continue to monitor for progression.    Dr. Emily Filbert is Medical Director for [place] and [facility]monary [facility].

## 2016-11-15 ENCOUNTER — Encounter: Payer: Self-pay | Admitting: Respiratory Therapy

## 2016-11-15 VITALS — Ht 68.25 in | Wt 119.0 lb

## 2016-11-15 DIAGNOSIS — J8410 Pulmonary fibrosis, unspecified: Secondary | ICD-10-CM

## 2016-11-15 DIAGNOSIS — J849 Interstitial pulmonary disease, unspecified: Secondary | ICD-10-CM | POA: Diagnosis not present

## 2016-11-15 DIAGNOSIS — J479 Bronchiectasis, uncomplicated: Secondary | ICD-10-CM

## 2016-11-15 NOTE — Progress Notes (Signed)
Pulmonary Individual Treatment Plan  Patient Details  Name: Crystal Gregory MRN: [id number] Date of Birth: [birth year] Referring Provider:   Flowsheet Row Pulmonary Rehab from 08/10/2016 in [facility] Cardiac and Pulmonary Rehab  Referring Provider  Ramaswamy      Initial Encounter Date:  Flowsheet Row Pulmonary Rehab from 08/10/2016 in [facility] Cardiac and Pulmonary Rehab  Date  08/10/16  Referring Provider  Ramaswamy      Visit Diagnosis: Pulmonary fibrosis ([place])  Bronchiectasis without complication ([place])  Patient's Home Medications on Admission:  Current Outpatient Prescriptions:    albuterol (PROVENTIL HFA;VENTOLIN HFA) 108 (90 Base) MCG/ACT inhaler, Inhale 2 puffs into the lungs every 6 (six) hours as needed., Disp: 1 Inhaler, Rfl: 0   AMBULATORY NON FORMULARY MEDICATION, Medication Name: Incentive Spirometry  Use 10-15 times daily, Disp: 1 each, Rfl: 0   Calcium Carbonate-Vit D-Min (CALCIUM 1200 PO), Take by mouth., Disp: , Rfl:    denosumab (PROLIA) 60 MG/ML SOLN injection, Inject 60 mg into the skin every 6 (six) months. Administer in upper arm, thigh, or abdomen, Disp: , Rfl:    fluticasone (FLONASE) 50 MCG/ACT nasal spray, Place 2 sprays into both nostrils daily., Disp: 16 g, Rfl: 5   pantoprazole (PROTONIX) 20 MG tablet, Take 1 tablet (20 mg total) by mouth daily., Disp: 90 tablet, Rfl: 1   propranolol (INDERAL) 10 MG tablet, TAKE 1 TABLET TWICE A DAY, Disp: 180 tablet, Rfl: 1   Respiratory Therapy Supplies (FLUTTER) DEVI, Use 10-15 times daily, Disp: 1 each, Rfl: 0   traZODone (DESYREL) 50 MG tablet, TAKE 1 TABLET AT BEDTIME, Disp: 90 tablet, Rfl: 1  Past Medical History: Past Medical History:  Diagnosis Date   Allergy    GERD (gastroesophageal reflux disease)    Septicemia ([place]) 1999   spent 4 weeks in ICU, intubated -? PNA    Tobacco Use: History  Smoking Status   Former Smoker   Packs/day: 0.50   Years: 30.00   Quit date: 01/16/1981  Smokeless Tobacco    Never Used    Labs: Recent Review Flowsheet Data    There is no flowsheet data to display.       ADL [facility]:     Pulmonary Assessment Scores    Row Name 08/10/16 1216 10/01/16 1324       ADL [facility]   ADL Phase Entry Mid    SOB Score total 64 50    Rest 1 0    Walk 3 2    Stairs 3 3    Bath 3 2    Dress 3 2    Shop 2 2       Pulmonary Function Assessment:     Pulmonary Function Assessment - 08/10/16 1214      Pulmonary Function Tests   FVC% 80 %   FEV1% 63 %   FEV1/FVC Ratio 58     Initial Spirometry Results   Comments Test date 06/18/16     Breath   Bilateral Breath Sounds Clear   Shortness of Breath Yes;Limiting activity      Exercise Target Goals:    Exercise Program Goal: Individual exercise prescription set with THRR, safety & activity barriers. Participant demonstrates ability to understand and report RPE using BORG scale, to self-measure pulse accurately, and to acknowledge the importance of the exercise prescription.  Exercise Prescription Goal: Starting with aerobic activity 30 plus minutes a day, 3 days per week for initial exercise prescription. Provide home exercise prescription and guidelines that participant acknowledges understanding  prior to discharge. ° °Activity Barriers & Risk Stratification: °  °  °Activity Barriers & Cardiac Risk Stratification - 08/10/16 1213   °  ° Activity Barriers & Cardiac Risk Stratification  ° Activity Barriers Shortness of Breath;Deconditioning  ° Cardiac Risk Stratification Low  °  ° ° °6 Minute Walk: °  °  °6 Minute Walk   ° Row Name 08/10/16 1234 10/01/16 1056  °  °  ° 6 Minute Walk  ° Phase  -- Mid Program   ° Distance 1094 feet 1165 feet   ° Walk Time 6 minutes 6 minutes   ° # of Rest Breaks 0 0   ° MPH 2.07  --   ° METS 2.53 2.69   ° RPE 11 12   ° Perceived Dyspnea  3 3   ° VO2 Peak 7.78 9.42   ° Symptoms No  --   ° Resting HR 87 bpm 85 bpm   ° Resting BP 110/56 104/58   ° Max Ex. HR 103 bpm 98 bpm   ° Max Ex. BP  106/56 138/64   °  ° Interval HR  ° Baseline HR 87  --   ° 1 Minute HR 95 99   ° 2 Minute HR 101 98   ° 3 Minute HR 103 104   ° 4 Minute HR 103 97   ° 5 Minute HR 103 104   ° 6 Minute HR 103 98   ° 2 Minute Post HR 77 93   ° Interval Heart Rate? Yes  --   °  ° Interval Oxygen  ° Interval Oxygen? Yes  --   ° Baseline Oxygen Saturation % 97 % 97 %  °all values on room air   ° 1 Minute Oxygen Saturation % 97 % 97 %   ° 2 Minute Oxygen Saturation % 98 % 98 %   ° 3 Minute Oxygen Saturation % 98 % 96 %   ° 4 Minute Oxygen Saturation % 98 % 97 %   ° 5 Minute Oxygen Saturation % 96 % 95 %   ° 6 Minute Oxygen Saturation % 98 % 97 %   ° 2 Minute Post Oxygen Saturation % 99 % 98 %   °  ° ° °Initial Exercise Prescription: °  °  °Initial Exercise Prescription - 08/10/16 1200   °  ° Date of Initial Exercise RX and Referring Provider  ° Date 08/10/16  ° Referring Provider Ramaswamy  °  ° Treadmill  ° MPH 1.8  ° Grade 0.5  ° Minutes 15  ° METs 2.5  °  ° Recumbant Bike  ° Level 1  ° RPM 60  ° Minutes 15  ° METs 2  °  ° Recumbant Elliptical  ° Level 1  ° RPM 50  ° Minutes 15  ° METs 2  °  ° Elliptical  ° Level 1  ° Speed 2.5  ° Minutes 15  ° METs 2  °  ° Prescription Details  ° Frequency (times per week) 3  ° Duration Progress to 45 minutes of aerobic exercise without signs/symptoms of physical distress  °  ° Intensity  ° THRR 40-80% of Max Heartrate 106-125  ° Ratings of Perceived Exertion 11-15  ° Perceived Dyspnea 0-4  °  ° Progression  ° Progression Continue to progress workloads to maintain intensity without signs/symptoms of physical distress.  °  ° Resistance Training  ° Training Prescription Yes  ° Weight 2  °   Reps 10-12  °  ° ° °Perform Capillary Blood Glucose checks as needed. ° °Exercise Prescription Changes: °  °  °Exercise Prescription Changes   ° Row Name 08/16/16 1200 08/23/16 1200 08/24/16 1500 09/08/16 1300 09/23/16 1100  °  ° Exercise Review  ° Progression  --  -- Yes Yes Yes  °  ° Response to Exercise  ° Blood  Pressure (Admit) 126/60  -- 114/60 110/60 102/60  ° Blood Pressure (Exercise) 126/58  -- 124/82 132/64 136/70  ° Blood Pressure (Exit) 110/64  -- 122/70 90/52 102/48  ° Heart Rate (Admit) 78 bpm  -- 88 bpm 98 bpm 96 bpm  ° Heart Rate (Exercise) 109 bpm  -- 106 bpm 100 bpm 108 bpm  ° Heart Rate (Exit) 74 bpm  -- 79 bpm 74 bpm 86 bpm  ° Oxygen Saturation (Admit) 97 %  -- 96 % 98 % 98 %  ° Oxygen Saturation (Exercise) 94 %  -- 95 % 97 % 95 %  ° Oxygen Saturation (Exit) 98 %  -- 98 % 98 % 95 %  ° Rating of Perceived Exertion (Exercise) 13  -- 13 12 12  ° Perceived Dyspnea (Exercise) 3  -- 3 3 3  ° Duration  --  -- Progress to 45 minutes of aerobic exercise without signs/symptoms of physical distress Progress to 45 minutes of aerobic exercise without signs/symptoms of physical distress Progress to 45 minutes of aerobic exercise without signs/symptoms of physical distress  ° Intensity  --  -- THRR unchanged THRR unchanged THRR unchanged  °  ° Progression  ° Progression  --  --  --  -- Continue to progress workloads to maintain intensity without signs/symptoms of physical distress.  °  ° Resistance Training  ° Training Prescription Yes  -- Yes Yes Yes  ° Weight 2  -- 2 2 3  ° Reps 10-12  -- 10-12 10-12 10-12  °  ° Interval Training  ° Interval Training  --  -- No No No  °  ° Treadmill  ° MPH 1.8  -- 1.8 1.8 2.1  ° Grade 0.5  -- 0.5 0.5 1  ° Minutes 15  -- 15 15 15  ° METs 2.5  -- 2.5 2.5 2.9  °  ° Recumbant Bike  ° Level 1  --  --  --  --  ° RPM 60  --  --  --  --  ° Minutes 15  --  --  --  --  ° METs 2  --  --  --  --  °  ° NuStep  ° Level  --  -- 1 2 2  ° Minutes  --  -- 15 15 15  ° METs  --  --  -- 2  --  °  ° Recumbant Elliptical  ° Level 1  --  --  --  --  ° RPM 50  --  --  --  --  ° Minutes 15  --  --  --  --  ° METs 2  --  --  --  --  °  ° REL-XR  ° Level  --  -- 1 2 2  ° Minutes  --  -- 15 15 15  ° METs  --  --  -- 2.2 3.1  °  ° Home Exercise Plan  ° Plans to continue exercise at  -- Home  --  --  --  ° Frequency   -- Add 1 additional   day to program exercise sessions.  --  --  --  ° Row Name 10/07/16 1200 10/22/16 1000 11/04/16 1200  °  °  °  ° Exercise Review  ° Progression Yes Yes  --    °  ° Response to Exercise  ° Blood Pressure (Admit) 114/68 128/60 98/62    ° Blood Pressure (Exercise) 126/64 142/84 120/60    ° Blood Pressure (Exit) 124/68 114/60 90/46    ° Heart Rate (Admit) 80 bpm 75 bpm 90 bpm    ° Heart Rate (Exercise) 121 bpm 103 bpm 104 bpm    ° Heart Rate (Exit) 74 bpm 73 bpm 78 bpm    ° Oxygen Saturation (Admit) 98 % 99 % 97 %    ° Oxygen Saturation (Exercise) 92 % 97 % 96 %    ° Oxygen Saturation (Exit) 98 % 99 % 98 %    ° Rating of Perceived Exertion (Exercise) 12 12 12    ° Perceived Dyspnea (Exercise) 3 3 2    ° Duration Progress to 45 minutes of aerobic exercise without signs/symptoms of physical distress Progress to 45 minutes of aerobic exercise without signs/symptoms of physical distress Progress to 45 minutes of aerobic exercise without signs/symptoms of physical distress    ° Intensity THRR unchanged THRR unchanged THRR unchanged    °  ° Progression  ° Progression Continue to progress workloads to maintain intensity without signs/symptoms of physical distress. Continue to progress workloads to maintain intensity without signs/symptoms of physical distress. Continue to progress workloads to maintain intensity without signs/symptoms of physical distress.    °  ° Resistance Training  ° Training Prescription Yes Yes Yes    ° Weight 3 3 3    ° Reps 10-12 10-12 10-12    °  ° Interval Training  ° Interval Training No No No    °  ° Treadmill  ° MPH 2 2.1 2.1    ° Grade 1 1.5 1.5    ° Minutes 15 15 15    ° METs 2.81 3.04 3.04    °  ° NuStep  ° Level  -- 2 2    ° Minutes  -- 15 15    ° METs  -- 2.1 2.1    °  ° REL-XR  ° Level 2  --  --    ° Minutes 15  --  --    ° METs 3.1  --  --    °  ° ° °Exercise Comments: °  °  °Exercise Comments   ° Row Name 08/16/16 1250 08/23/16 1253 08/24/16 1529 09/08/16 1338 09/23/16  1157  ° Exercise Comments First full day of exercise!  Patient was oriented to gym and equipment including functions, settings, policies, and procedures.  Patient's individual exercise prescription and treatment plan were reviewed.  All starting workloads were established based on the results of the 6 minute walk test done at initial orientation visit.  The plan for exercise progression was also introduced and progression will be customized based on patient's performance and goals. Rusty likes the warm up and strength exercises.  Home exercise was explained and she verbalized understanding of HR, RPE and safety. Joylyn is progressing well in her first week of exercise. Nocole is progressing well with exercise and has added resistance to NS and XR. Veanna is progressing well with exercise.  ° Row Name 10/07/16 1238 10/22/16 1053 11/04/16 1249  °  °  ° Exercise Comments Annaliah is   doing well with exercise progression. Jalexis is progressing well with exercise. Onda reports feeling stronger and still feels the strength and stretch exercises are beneficial.    °  ° ° °Discharge Exercise Prescription (Final Exercise Prescription Changes): °  °  °Exercise Prescription Changes - 11/04/16 1200   °  ° Response to Exercise  ° Blood Pressure (Admit) 98/62  ° Blood Pressure (Exercise) 120/60  ° Blood Pressure (Exit) 90/46  ° Heart Rate (Admit) 90 bpm  ° Heart Rate (Exercise) 104 bpm  ° Heart Rate (Exit) 78 bpm  ° Oxygen Saturation (Admit) 97 %  ° Oxygen Saturation (Exercise) 96 %  ° Oxygen Saturation (Exit) 98 %  ° Rating of Perceived Exertion (Exercise) 12  ° Perceived Dyspnea (Exercise) 2  ° Duration Progress to 45 minutes of aerobic exercise without signs/symptoms of physical distress  ° Intensity THRR unchanged  °  ° Progression  ° Progression Continue to progress workloads to maintain intensity without signs/symptoms of physical distress.  °  ° Resistance Training  ° Training Prescription Yes  ° Weight 3  ° Reps  10-12  °  ° Interval Training  ° Interval Training No  °  ° Treadmill  ° MPH 2.1  ° Grade 1.5  ° Minutes 15  ° METs 3.04  °  ° NuStep  ° Level 2  ° Minutes 15  ° METs 2.1  °  ° ° ° °Nutrition:  °Target Goals: Understanding of nutrition guidelines, daily intake of sodium <1500mg, cholesterol <200mg, calories 30% from fat and 7% or less from saturated fats, daily to have 5 or more servings of fruits and vegetables. ° °Biometrics: °  °  °Pre Biometrics - 08/10/16 1229   °  ° Pre Biometrics  ° Height 5' 8.25" (1.734 m)  ° Weight 118 lb 3.2 oz (53.6 kg)  ° Waist Circumference 26.75 inches  ° Hip Circumference 35.25 inches  ° Waist to Hip Ratio 0.76 %  ° BMI (Calculated) 17.9  °  ° ° ° °Nutrition Therapy Plan and Nutrition Goals: ° ° °Nutrition Discharge: Rate Your Plate Scores: ° ° °Psychosocial: °Target Goals: Acknowledge presence or absence of depression, maximize coping skills, provide positive support system. Participant is able to verbalize types and ability to use techniques and skills needed for reducing stress and depression. ° °Initial Review & Psychosocial Screening: °  °  °Initial Psych Review & Screening - 08/10/16 1226   °  ° Family Dynamics  ° Good Support System? Yes  ° Comments Ms Quintanilla has good support from her husband and 2 children. They live at [place] and enjoy the retirement community life. Recently diagnosed with pulmonary fibrosis has been a challenge, and she is looking forward to the [facility] program for disease management skills.  °  ° Barriers  ° Psychosocial barriers to participate in program There are no identifiable barriers or psychosocial needs.;The patient should benefit from training in stress management and relaxation.  °  ° Screening Interventions  ° Interventions Encouraged to exercise  °  ° ° °Quality of Life Scores: °  °  °Quality of Life - 08/10/16 1231   °  ° Quality of Life Scores  ° Health/Function Pre 20.69 %  ° Socioeconomic Pre 20.83 %  ° Psych/Spiritual Pre 20.71 %  °  Family Pre 21 %  ° GLOBAL Pre 20.76 %  °  ° ° °PHQ-9: °Recent Review Flowsheet Data   ° Depression screen PHQ 2/9 08/10/2016  ° Decreased   Interest 1   Down, Depressed, Hopeless 0   PHQ - 2 Score 1   Altered sleeping 1   Tired, decreased energy 3   Change in appetite 0   Feeling bad or failure about yourself  0   Trouble concentrating 0   Moving slowly or fidgety/restless 0   Suicidal thoughts 0   PHQ-9 Score 5      Psychosocial Evaluation and Intervention:     Psychosocial Evaluation - 08/16/16 1016      Psychosocial Evaluation & Interventions   Interventions Encouraged to exercise with the program and follow exercise prescription   Comments Counselor met with Ms. Ritacco today for initial psychosocial evaluation.  She is an 83 year old who has IPF and Bronchiectasis.  She has a strong support system with a spouse of 40 years and (2) adult children.  She also lives in a retirement community.  Ms. Vanderslice states she sleeps fine and her appetite has improved recently.  She reports a history of anxiety in the early 1970's subsequent to her spouse being in the [place] and her having two small children.  She denies any current symptoms of depression or anxiety and states she is typically in a positive mood.  Other than her health she states there is minimal stress in her life.  She has goals to increase her stamina and strength and be able to walk and stand without as much shortness of breath.  Ms. Bills plans to more consistently exercise following this program by walking, which she enjoys, and working out in the retirement [employer] (which she does not enjoy!).  Staff will follow with Ms. Dullea throughout the course of this program.        Psychosocial Re-Evaluation:     Psychosocial Re-Evaluation    [place] Name 09/01/16 1121 10/11/16 1517 11/08/16 1517         Psychosocial Re-Evaluation   Comments Counselor follow up with Ms. Joya Gaskins (Ms. W) today reporting she has realized how  "out of shape" she was and is all the more committed to consistency in working out.  She hasn't recognized any progress yet as she has only been here two weeks and had to miss several days last week due to holidays and guests in her home.  Counselor will continue to follow. Curt Bears psychosocial assessment reveals no barriers at this time to participation in Pulmonary Rehab.  She has good family and friend support that encourages Kada to participate in [place] and progress with Her goals.  Neeya concerns are monitored, but she has acknowledge that attending the program has helped to maintain quality life with improved mobility, self-care, and emotional and financial stability.  Annaleigha is commended for regular attendance and self-motivation to improve Her pulmonary disease management. During a recent appointment with her pulmonologist, she was very encouraged that due to her good diet, exercise, attitude, and family support, her physician stated her lung disease was very manageable. Curt Bears psychosocial assessment reveals no barriers at this time to participation in Pulmonary Rehab.  she has good family and friend support that encourages Erienne to participate in [place] and progress with Her goals.  Earnest concerns are monitored, but she has acknowledge that attending the program has helped to maintain quality life with improved mobility, self-care, and emotional and financial stability.  Rayleen is commended for regular attendance and self-motivation to improve Her pulmonary disease management.       Education: Education Goals: Education classes will be provided on a  weekly basis, covering required topics. Participant will state understanding/return demonstration of topics presented. ° °Learning Barriers/Preferences: °  °  °Learning Barriers/Preferences - 08/10/16 1214   °  ° Learning Barriers/Preferences  ° Learning Barriers None  ° Learning Preferences None  °  ° ° °Education Topics: °Initial  Evaluation Education: °- Verbal, written and demonstration of respiratory meds, RPE/PD scales, oximetry and breathing techniques. Instruction on use of nebulizers and MDIs: cleaning and proper use, rinsing mouth with steroid doses and importance of monitoring MDI activations. °Flowsheet Row Pulmonary Rehab from 11/10/2016 in [facility] and Pulmonary Rehab  °Date  08/10/16  °Educator  LB  °Instruction Review Code  2- meets goals/outcomes  °  ° ° °General Nutrition Guidelines/Fats and Fiber: °-Group instruction provided by verbal, written material, models and posters to present the general guidelines for heart healthy nutrition. Gives an explanation and review of dietary fats and fiber. °Flowsheet Row Pulmonary Rehab from 11/10/2016 in [facility]  °Date  08/23/16  °Educator  CR  °Instruction Review Code  2- meets goals/outcomes  °  ° ° °Controlling Sodium/Reading Food Labels: °-Group verbal and written material supporting the discussion of sodium use in heart healthy nutrition. Review and explanation with models, verbal and written materials for utilization of the food label. °Flowsheet Row Pulmonary Rehab from 11/10/2016 in [facility] and Pulmonary Re[facility]  °Date  08/30/16  °Educator  CR  °Instruction Review Code  2- meets goals/outcomes  °  ° ° °Exercise Physiology & Risk Factors: °- Group verbal and written instruction with models to review the exercise physiology of the cardiovascular system and associated critical values. Details cardiovascular disease risk factors and the goals associated with each risk factor. °Flowsheet Row Pulmonary Rehab from 11/10/2016 in [facility] and Pul[facility] Re[facility]  °Date  10/06/16  °Educator  JH  °Instruction Review Code  2- meets goals/outcomes  °  ° ° °Aerobic Exercise & Resistance Training: °- Gives group verbal and written discussion on the health impact of inactivity. On the components of aerobic and resistive training programs and the benefits of this training  and how to safely progress through these programs. °Flowsheet Row Pulmonary Rehab from 11/10/2016 in [facility] and [facility] Re[facility]  °Date  11/03/16  °Educator  JH  °Instruction Review Code  2- meets goals/outcomes  °  ° ° °Flexibility, Balance, General Exercise Guidelines: °- Provides group verbal and written instruction on the benefits of flexibility and balance training programs. Provides general exercise guidelines with specific guidelines to those with heart or lung disease. Demonstration and skill practice provided. °Flowsheet Row Pulmonary Rehab from 11/10/2016 in [facility]  °Date  09/22/16  °Educator  JH/AS  °Instruction Review Code  2- meets goals/outcomes  °  ° ° °Stress Management: °- Provides group verbal and written instruction about the health risks of elevated stress, cause of high stress, and healthy ways to reduce stress. ° ° °Depression: °- Provides group verbal and written instruction on the correlation between heart/lung disease and depressed mood, treatment options, and the stigmas associated with seeking treatment. °Flowsheet Row Pulmonary Rehab from 11/10/2016 in [facility]  °Date  09/06/16  °Educator  KC  °Instruction Review Code  2- meets goals/outcomes  °  ° ° °Exercise & Equipment Safety: °- Individual verbal instruction and demonstration of equipment use and safety with use of the equipment. °Flowsheet Row Pulmonary Rehab from 11/10/2016 in [facility]  °Date  08/16/16  °  Educator  AS  °Instruction Review Code  2- meets goals/outcomes  °  ° ° °Infection Prevention: °- Provides verbal and written material to individual with discussion of infection control including proper hand washing and proper equipment cleaning during exercise session. °Flowsheet Row Pulmonary Rehab from 11/10/2016 in [facility]  °Date  08/16/16  °Educator  AS  °Instruction Review Code  2- meets goals/outcomes  °  ° ° °Falls Prevention: °-  Provides verbal and written material to individual with discussion of falls prevention and safety. °Flowsheet Row Pulmonary Rehab from 11/10/2016 in [facility]  °Date  08/10/16  °Educator  LB  °Instruction Review Code  2- meets goals/outcomes  °  ° ° °Diabetes: °- Individual verbal and written instruction to review signs/symptoms of diabetes, desired ranges of glucose level fasting, after meals and with exercise. Advice that pre and post exercise glucose checks will be done for 3 sessions at entry of program. ° ° °Chronic Lung Diseases: °- Group verbal and written instruction to review new updates, new respiratory medications, new advancements in procedures and treatments. Provide informative websites and "800" numbers of self-education. °Flowsheet Row Pulmonary Rehab from 11/10/2016 in [facility]  °Date  10/27/16  °Educator  LB  °Instruction Review Code  2- meets goals/outcomes  °  ° ° °Lung Procedures: °- Group verbal and written instruction to describe testing methods done to diagnose lung disease. Review the outcome of test results. Describe the treatment choices: Pulmonary Function Tests, ABGs and oximetry. ° ° °Energy Conservation: °- Provide group verbal and written instruction for methods to conserve energy, plan and organize activities. Instruct on pacing techniques, use of adaptive equipment and posture/positioning to relieve shortness of breath. ° ° °Triggers: °- Group verbal and written instruction to review types of environmental controls: home humidity, furnaces, filters, dust mite/pet prevention, HEPA vacuums. To discuss weather changes, air quality and the benefits of nasal washing. °Flowsheet Row Pulmonary Rehab from 11/10/2016 in [facility]  °Date  09/29/16  °Educator  LB  °Instruction Review Code  2- meets goals/outcomes  °  ° ° °Exacerbations: °- Group verbal and written instruction to provide: warning signs, infection symptoms, calling  MD promptly, preventive modes, and value of vaccinations. Review: effective airway clearance, coughing and/or vibration techniques. Create an Action Plan. °Flowsheet Row Pulmonary Rehab from 11/10/2016 in [facility]  °Date  09/08/16  °Educator  LB  °Instruction Review Code  2- meets goals/outcomes  °  ° ° °Oxygen: °- Individual and group verbal and written instruction on oxygen therapy. Includes supplement oxygen, available portable oxygen systems, continuous and intermittent flow rates, oxygen safety, concentrators, and Medicare reimbursement for oxygen. ° ° °Respiratory Medications: °- Group verbal and written instruction to review medications for lung disease. Drug class, frequency, complications, importance of spacers, rinsing mouth after steroid MDI's, and proper cleaning methods for nebulizers. °Flowsheet Row Pulmonary Rehab from 11/10/2016 in [facility]  °Date  08/10/16  °Educator  LB  °Instruction Review Code  2- meets goals/outcomes  °  ° ° °AED/CPR: °- Group verbal and written instruction with the use of models to demonstrate the basic use of the AED with the basic ABC's of resuscitation. ° ° °Breathing Retraining: °- Provides individuals verbal and written instruction on purpose, frequency, and proper technique of diaphragmatic breathing and pursed-lipped breathing. Applies individual practice skills. °Flowsheet Row Pulmonary Rehab from 11/10/2016 in [facility]  °Date  08/16/16  °Educator  AS  °Instruction Review Code  2- meets goals/outcomes  °  ° ° °Anatomy and Physiology of the Lungs: °- Group verbal and written instruction with the use of models to provide basic lung anatomy and physiology related to function, structure and complications of lung disease. °Flowsheet Row Pulmonary Rehab from 11/10/2016 in [facility]  °Date  09/01/16  °Educator  LB  °Instruction Review Code  2- meets goals/outcomes  °  ° ° °Heart Failure: °-  Group verbal and written instruction on the basics of heart failure: signs/symptoms, treatments, explanation of ejection fraction, enlarged heart and cardiomyopathy. °Flowsheet Row Pulmonary Rehab from 11/10/2016 in [facility]  °Date  09/17/16  °Educator  SB  °Instruction Review Code  2- meets goals/outcomes  °  ° ° °Sleep Apnea: °- Individual verbal and written instruction to review Obstructive Sleep Apnea. Review of risk factors, methods for diagnosing and types of masks and machines for OSA. ° ° °Anxiety: °- Provides group, verbal and written instruction on the correlation between heart/lung disease and anxiety, treatment options, and management of anxiety. °Flowsheet Row Pulmonary Rehab from 11/10/2016 in [facility]  °Date  10/13/16  °Educator  KC  °Instruction Review Code  2- Meets goals/outcomes  °  ° ° °Relaxation: °- Provides group, verbal and written instruction about the benefits of relaxation for patients with heart/lung disease. Also provides patients with examples of relaxation techniques. °Flowsheet Row Pulmonary Rehab from 11/10/2016 in [facility]  °Date  11/10/16  °Educator  KC  °Instruction Review Code  2- Meets goals/outcomes  °  ° ° °Knowledge Questionnaire Score: °  °  °Knowledge Questionnaire Score - 08/10/16 1214   °  ° Knowledge Questionnaire Score  ° Pre Score 9/10  °  °  ° °Core Components/Risk Factors/Patient Goals at Admission: °  °  °Personal Goals and Risk Factors at Admission - 08/10/16 1231   °  ° Core Components/Risk Factors/Patient Goals on Admission  ° Intervention --  °Ms Zavaleta would like to meet with the dietitian. She watches her diet and adds protein. Her BMI is 17.9 and normal range for her would be 18-24.  ° Goal Weight: Long Term 130 lb (59 kg)  °  ° ° °Core Components/Risk Factors/Patient Goals Review:  °  °  °Goals and Risk Factor Review   ° Row Name 08/16/16 1245 08/23/16 1251 09/08/16 1345 09/13/16 1535 10/11/16  1502  °  ° Core Components/Risk Factors/Patient Goals Review  ° Personal Goals Review Develop more efficient breathing techniques such as purse lipped breathing and diaphragmatic breathing and practicing self-pacing with activity. Sedentary;Increase Strength and Stamina Sedentary;Increase Strength and Stamina;Develop more efficient breathing techniques such as purse lipped breathing and diaphragmatic breathing and practicing self-pacing with activity.;Improve shortness of breath with ADL's;Increase knowledge of respiratory medications and ability to use respiratory devices properly. Increase knowledge of respiratory medications and ability to use respiratory devices properly. Sedentary;Increase Strength and Stamina;Improve shortness of breath with ADL's;Increase knowledge of respiratory medications and ability to use respiratory devices properly.;Weight Management/Obesity;Develop more efficient breathing techniques such as purse lipped breathing and diaphragmatic breathing and practicing self-pacing with activity.  ° Review Pursed lip breathing technique was discussed with the patient and how to use it with exercise and ADL's. Patient demonstrated understanding of this technique.  Carolynn has not been sore from beginning exercise.  She likes the warm up and strength exercises   done in class. Her husband encourages her to exercise. Ms Wysocki has an appointment with her pulmonologist tomorrow. I sent with her her ITP for him to see her progress with her exercise goals. Ms Willhelm is so motivated to learn as much as she can about her bronchectasis and pulmonary fibrosis, so I did give her educational material about these diseases. Ms diosdado is interested in meeting with the dietitian and has been increasing her protein with Ensure. She rarely uses her Albuterol., but performs the PLB which is helpful with activity. I talked to her about the [place] for use with bronchiectasis, and she is planning to talk to her  physician about this therapy. Ms Korell was recently order Spiriva by Dr Chase Caller. He also ordered blood work, echo, and a CT of her lungs. She will also have an appointment with Dr Alva Garnet for a local pulmonologist. Ms Mangine had a new appointment with Dr Alva Garnet. Her cath was negative for heart disease, and her CT of her lungs was reviewed by Dr Alva Garnet. She has a repeat CT on 11/06/16. The physician was very positive about her pul fibrosis and bronchiectasis. Mr Tison takes care of herself by diet, exercise, and eatting properly. Ms Malatesta is to gain weight, but prefers not to meet with the dietitian. She has added a protein drink to her diet. She has a good understanding of her Spiriva and Albuterol MDI. She uses PLB with good technique. She attends regularly and enjoys the education.   Expected Outcomes Patient will use pursed lip breathing to help control SOB during exercise and ADL's.  Tommye will continue to build strength and stamina. Continue to increase her exercise goals and learn more disease management skills. Take her new MDI, Spiriva correctly. Continue progressing with exercise goals in [facility] and increase education to manage her Pul Fibrosis and Bronchiectasis.   [place] Name 11/01/16 1112 11/03/16 1509           Core Components/Risk Factors/Patient Goals Review   Personal Goals Review Weight Management/Obesity;Increase Strength and Stamina  --      Review Ameri is currently at 119 lbs and her doctor wants her to get up to 125 lbs. She continues to use Ensure and protein to work toward this goal. She also stated that she enjoys the strengthening exercise in class and feels like they are getting a little easier as she gets stronger. [employer] information was given to her as an option to continue with exercise once she graduates from [employer].  Discussed with Ms Palma her future appointment for a CT of the lungs and a PFT. Using her previous PFT, I educated her on the important  results, such as FEV1, DLCO, especially with her IPF, and Lung Volumes.  She has a copy of her last test, and she can compare results - keeping them at least the same results.      Expected Outcomes Lyndel Pleasure will continue to consistantly come to exercise and will work towards her strengthening and weight goals.  Have a better understanding of her PFT's.         Core Components/Risk Factors/Patient Goals at Discharge (Final Review):      Goals and Risk Factor Review - 11/03/16 1509      Core Components/Risk Factors/Patient Goals Review   Review Discussed with Ms Melcher her future appointment for a CT of the lungs and a PFT. Using her previous PFT, I educated her on the important results, such as FEV1, DLCO, especially with  her IPF, and Lung Volumes.  She has a copy of her last test, and she can compare results - keeping them at least the same results.   Expected Outcomes Have a better understanding of her PFT's.      ITP Comments:     ITP Comments    Row Name 09/03/16 1102           ITP Comments "Know Your Numbers" education was completed today with learning goals met.           Comments:  30 Day Note Review

## 2016-11-15 NOTE — Progress Notes (Signed)
Daily Session Note  Patient Details  Name: Crystal Gregory MRN: [id number] Date of Birth: 03/30/1932 Referring Provider:   Flowsheet Row Pulmonary Rehab from 08/10/2016 in [facility]  Referring Provider  Ramaswamy      Encounter Date: 11/15/2016  Check In:     Session Check In - 11/15/16 1159      Check-In   Location [facility]   Staff Present Nyoka Cowden, RN, BSN, [place], BS, RRT, Respiratory Dareen Piano, BA, ACSM CEP, Exercise Physiologist   Supervising physician immediately available to respond to emergencies [facility] immediately available ER MD   Physician(s) Jacqualine Code and Mariea Clonts   Medication changes reported     No   Fall or balance concerns reported    No   Warm-up and Cool-down Performed as group-led instruction   Resistance Training Performed Yes   VAD Patient? No     Pain Assessment   Currently in Pain? No/denies   Multiple Pain Sites No         Goals Met:  Proper associated with RPD/PD & O2 Sat Independence with exercise equipment Exercise tolerated well Strength training completed today  Goals Unmet:  Not Applicable  Comments:      6 Minute Walk    Row Name 08/10/16 1234 10/01/16 1056 11/15/16 1202     6 Minute Walk   Phase  - Mid Program Discharge   Distance 1094 feet 1165 feet 1267 feet   Distance % Change  -  - 15.8 %   Walk Time 6 minutes 6 minutes 6 minutes   # of Rest Breaks 0 0 0   MPH 2.07  - 2.4   METS 2.53 2.69 2.77   RPE '11 12 12   '$ Perceived Dyspnea  '3 3 2   '$ VO2 Peak 7.78 9.42 9.7   Symptoms No  - No   Resting HR 87 bpm 85 bpm 85 bpm   Resting BP 110/56 104/58 108/60   Max Ex. HR 103 bpm 98 bpm 116 bpm   Max Ex. BP 106/56 138/64 112/64     Interval HR   Baseline HR 87  - 85   1 Minute HR 95 99 102   2 Minute HR 101 98 105   3 Minute HR 103 104 103   4 Minute HR 103 97 116   5 Minute HR 103 104 107   6 Minute HR 103 98 98   2 Minute Post HR 77 93  -   Interval Heart Rate? Yes  - Yes     Interval Oxygen   Interval Oxygen? Yes  -  -   Baseline Oxygen Saturation % 97 % 97 %  all values on room air 97 %   1 Minute Oxygen Saturation % 97 % 97 % 97 %   2 Minute Oxygen Saturation % 98 % 98 % 96 %   3 Minute Oxygen Saturation % 98 % 96 % 96 %   4 Minute Oxygen Saturation % 98 % 97 % 97 %   5 Minute Oxygen Saturation % 96 % 95 % 96 %   6 Minute Oxygen Saturation % 98 % 97 % 100 %   2 Minute Post Oxygen Saturation % 99 % 98 %  -        Dr. Emily Filbert is Medical Director for [facility] and [facility].

## 2016-11-17 DIAGNOSIS — J8410 Pulmonary fibrosis, unspecified: Secondary | ICD-10-CM

## 2016-11-17 DIAGNOSIS — J479 Bronchiectasis, uncomplicated: Secondary | ICD-10-CM

## 2016-11-17 DIAGNOSIS — J849 Interstitial pulmonary disease, unspecified: Secondary | ICD-10-CM | POA: Diagnosis not present

## 2016-11-17 NOTE — Progress Notes (Signed)
Daily Session Note  Patient Details  Name: Crystal Gregory MRN: [id number] Date of Birth: January 17, 1932 Referring Provider:   Flowsheet Row Pulmonary Rehab from 08/10/2016 in [facility]  Referring Provider  Ramaswamy      Encounter Date: 11/17/2016  Check In:     Session Check In - 11/17/16 1200      Check-In   Location [facility]   Staff Present Carson Myrtle, BS, RRT, Respiratory Lennie Hummer, MA, ACSM RCEP, Exercise Physiologist;Tilak Oakley Oletta Darter, BA, ACSM CEP, Exercise Physiologist   Supervising physician immediately available to respond to emergencies [facility] immediately available ER MD   Physician(s) Cinda Quest and Mc[place]ane   Medication changes reported     No   Fall or balance concerns reported    No   Warm-up and Cool-down Performed as group-led instruction   Resistance Training Performed Yes   VAD Patient? No         Goals Met:  Proper associated with RPD/PD & O2 Sat Independence with exercise equipment Exercise tolerated well Strength training completed today  Goals Unmet:  Not Applicable  Comments: Pt able to follow exercise prescription today without complaint.  Will continue to monitor for progression.    Dr. Emily Filbert is Medical Director for [facility] and [facility].

## 2016-11-18 ENCOUNTER — Ambulatory Visit (INDEPENDENT_AMBULATORY_CARE_PROVIDER_SITE_OTHER): Payer: Medicare Other | Admitting: Internal Medicine

## 2016-11-18 ENCOUNTER — Encounter: Payer: Self-pay | Admitting: Internal Medicine

## 2016-11-18 VITALS — BP 104/58 | HR 77 | Ht 68.0 in | Wt 119.0 lb

## 2016-11-18 DIAGNOSIS — J849 Interstitial pulmonary disease, unspecified: Secondary | ICD-10-CM

## 2016-11-18 DIAGNOSIS — I2510 Atherosclerotic heart disease of native coronary artery without angina pectoris: Secondary | ICD-10-CM | POA: Diagnosis not present

## 2016-11-18 LAB — PULMONARY FUNCTION TEST
DL/VA % PRED: 41 %
DL/VA: 2.18 ml/min/mmHg/L
DLCO COR % PRED: 37 %
DLCO UNC: 10.24 ml/min/mmHg
DLCO cor: 11.03 ml/min/mmHg
DLCO unc % pred: 34 %
FEF 25-75 Pre: 0.62 L/sec
FEF2575-%Pred-Pre: 41 %
FEV1-%Pred-Pre: 55 %
FEV1-PRE: 1.25 L
FEV1FVC-%PRED-PRE: 75 %
FEV6-%Pred-Pre: 79 %
FEV6-Pre: 2.25 L
FEV6FVC-%Pred-Pre: 105 %
FVC-%Pred-Pre: 75 %
FVC-PRE: 2.25 L
PRE FEV6/FVC RATIO: 100 %
Pre FEV1/FVC ratio: 55 %

## 2016-11-18 NOTE — Patient Instructions (Addendum)
ICD-9-CM ICD-10-CM   1. ILD (interstitial lung disease) ([place]) 515 J84.9 Pulmonary Function Test     CANCELED: Pulmonary Function Test  2. Coronary artery calcification seen on CAT scan 414.00 I25.10     Glad you are feeling better and energy levels improved  Plan  -= will inform Dr Rockey Situ about your coronary artery calcification  - in 3 months do Pre-bd spiro and dlco only. No lung volume or bd response at [facility] - respect desire to avoid esbriet/ofev as ofnow   folloowu  - Dr Alva Garnet in 3 months at [facility] - if PFTs declining - consider formal diagnosis of IPF and consideration of esbriet/ofev anti-fibrotic therapy

## 2016-11-18 NOTE — Progress Notes (Signed)
pft   Dr. Brand Males, M.D., [facility] Pulmonary and Critical Care Medicine Staff Physician [place] [facility] Pager: [phone], If no answer or between  15:00h - 7:00h: call [phone]  11/18/2016 11:58 PM

## 2016-11-18 NOTE — Progress Notes (Signed)
Subjective:     Patient ID: Crystal Gregory, female   DOB: 05/30/1932, 83 y.o.   MRN: [id number]  HPI   OV 08/03/2016  Chief Complaint  Patient presents with  . Follow-up    Pt states SOB. SOB w/ excertion. Pt states SOB all the time. Pt states pressure in chest. No productive cough     83 year old female accompanied by her husband. She is a retired Marine scientist. Her husband worked in [employer] and was in [place].   She tells me that in 1999 when she was 83 years of age she was admitted with septicemia and pneumonia. 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 She says that it took over 3 years to recover. She was on long-term oxygen therapy. She never required tracheostomy. She recovered from this and that is completely healthy life without any shortness of breath or any cough. In the interim she did not have any pulmonary imaging. 11 years ago her husband suffered a massive myocardial infarction and has been on a pacemaker ever since. On the daily walks patient would always wait for her husband. Some 10 years ago. Climb mountains in [place]. Even as of a year ago was able to do walks outpacing her t husband without any shortness of breath.   Then approximately in spring 2017 March/April 2017 she got she suffered a cold and after this was left with some persistent cough and shortness of breath. This was associated with loss of hair growth and nail growth and significant weight loss with these are recovering now. Symptoms just got worse over time. There was not much of sputum production. This then resulted in CT chest 03/22/2016 that shows diffuse bronchiectasis with some fibrotic pattern at the lung base. The CT chest was not a high-resolution CT chest. She was then referred to Dr. Stevenson Clinch. I reviewed his notes. He did autoimmune workup 06/21/2016 that was essentially negative except for borderline elevation rheumatoid factor. A high-resolution CT  chest 06/11/2016 that I personally visualized and reported below shows diffuse bronchiectasis but superimposed on that. Bibasal subpleural fibrosis pattern with some honeycombing that is consistent with UIP Personal opinio Chest spirometry 06/18/2016 that shows a mixed restrictive obstructive pattern. Do not have full pulmonary function test. She then underwent bronchoscopy with transbronchial l biopsy 07/05/2016 that is nondiagnostic. Currently appears somewhat better on September October 2017 she was treated with a short course prednisone and antibiotics which helped her cough immensely but her dyspnea is significant and still persistent and is progressive. This despite the short course prednisone. The cough is only mild and is dry. Noted breo did not help symptoms  Walking desaturation test 185 feet 3 laps on room air: 98% on all 3 laps and did not desaturate but got dyspneic   ACCP ILD question: patient left befor I could give this to her   has a past medical history of Allergy; GERD (gastroesophageal reflux disease); and Septicemia ([place]) (1999).   reports that she quit smoking about 35 years ago. She has a 15.00 pack-year smoking history. She has never used smokeless tobacco.   OV 09/10/2016  Chief Complaint  Patient presents with  . Follow-up    PFT results. pt states breathing is baseline. pt c/o sob with exertion , occ non prod cough & chest tightness with exercise.   Here to fu her ILD (confusing picture - suspect post pna/ards from > 10 years ago mild bronchiectasis with post inflmattory  fibrosis that was asymptomatic till spring 2017 and then flared up v new onset superimposed IPF, negative autoimmune panel and exposure hx with progression on CT spring -> fall 2017)  She is now in rehab and is likely helping. Hsuband with her this visit as well. Still with moderate dyspnea and cough. Sputum production is mild. Dyspnea is still the major issue for  Her - class 2-3 on exertion ,  releived by rest. She also has post nasal drip. She is interested in a PEP device for the bronchiectasis portion. She is not too keen on esbriet/ofev because one patient she met at rehab had significant GI intolerance to it. She is not too interested in undergoing surgical lung bx either. Short course prednisone helped but she is not too keen on chronic (few to several months) prednisone either.. She is worried about he progression on CT chest between spring 2017 (non HRCT) and fall 2017 (HRCT) She is open to inhaler Rx but ics/laba trials of 14 d x 2 did not help.   Review of lbs show - she is yet to have IgG and IgM eval for bronchiectasis  Also, PFT shows mod recution in dlco with normal spir and appears dyspnea is out of poporton for this. No echo on file  aCCP ILD question done but entered in 09/29/2016   - symptoms: cougn 10 mnths including cough at night (dry cough).  Dyspnea - level ground and falls behind  - past medical: hx of pna and gerd and sicca   - personal substance use hx: denies street drugs or rec drugs. Smoked cigs age 27 @ 71-10 cg/day and quit age 83  - family hx of lung dz: denies  - home exposure hx: denies birds, humidifer, sauna, hot tub, jacuzzi, mold  - domicile hx: lived in [place], [place], [place], [place], [place], [place], [place], [place], and now [place]  - occupation - Therapist, sports [phone]  - pnumotox hx: pred in past and nitrofurantoin   has a past medical history of Allergy; GERD (gastroesophageal reflux disease); and Septicemia ([place]) (1999). The one seen   OV 11/18/2016   Chief Complaint  Patient presents with  . Follow-up    Pt here after PFT and CT chest. Pt states her breathing is unchanged since last OV. Pt c/o cough with occ mucu production - yellow in color. Pt denies CP/tightness.    FU ILD -doubt IPF but possilbe. Hx of chronic nitrafurantoin intke  After [place] advised to stop nitrafurantoin. She does not feel any difference but on questioning says energy levels and dyspnea better. She is  outpacing her husband again at walk. PFT done and details below - unclear if worse or same or improved compared to nov 2017. This is because Nov 2017 PFTs seem unreliable. Had CT chest 11/04/16 - findings are reported stable since sept 2017. Dr Weber Cooks radiologist feels is c/w UIP . There is new finding of coronary artery calcification - patient concerned about this. She remains skeptical about IPF anti-fibrotic Rx duue to side effect profile    Results for Crystal Gregory, Crystal Gregory (MRN [id number]) as of 11/18/2016 11:10  Ref. Range 06/18/16 office spiro Nov 2017 [facility] pft lab   11/18/2016 09:35 [place]  FVC-Pre Latest Units: L 2.43/80% 2.57 2.25  FVC-%Pred-Pre Latest Units: %  86% 75  FEV1-Pre Latest Units: L  2.2 -> 1.3 (lot of variation) 1.25  FEV1-%Pred-Pre Latest Units: %   55  Pre FEV1/FVC ratio Latest Units: %   55  FEV1FVC-%Pred-Pre Latest  Units: %   75  Results for Crystal Gregory, Crystal Gregory (MRN [id number]) as of 11/18/2016 11:10  Ref. Range 08/05/16 11/18/2016 09:35  DLCO cor Latest Units: ml/min/mmHg 9.1 11.03  DLCO cor % pred Latest Units: % 57% 37     has a past medical history of Allergy; GERD (gastroesophageal reflux disease); and Septicemia ([place]) (1999).   reports that she quit smoking about 35 years ago. She has a 15.00 pack-year smoking history. She has never used smokeless tobacco.  Past Surgical History:  Procedure Laterality Date  . ABDOMINAL HYSTERECTOMY    . FLEXIBLE BRONCHOSCOPY N/A 07/05/2016   Procedure: FLEXIBLE BRONCHOSCOPY;  Surgeon: Vilinda Boehringer, MD;  Location: [facility] ORS;  Service: Cardiopulmonary;  Laterality: N/A;    No Known Allergies  Immunization History  Administered Date(s) Administered  . Influenza Split 06/04/2012  . Influenza, High Dose Seasonal PF 09/10/2016  . Influenza,inj,Quad PF,36+ Mos 07/04/2013, 08/18/2015  . Pneumococcal Conjugate-13 10/06/2011  . Zoster 01/14/2007    Family History  Problem Relation Age of Onset  . Parkinson's disease Mother   .  Colon polyps Mother 79       . Breast cancer Sister      Current Outpatient Prescriptions:  .  albuterol (PROVENTIL HFA;VENTOLIN HFA) 108 (90 Base) MCG/ACT inhaler, Inhale 2 puffs into the lungs every 6 (six) hours as needed., Disp: 1 Inhaler, Rfl: 0 .  AMBULATORY NON FORMULARY MEDICATION, Medication Name: Incentive Spirometry  Use 10-15 times daily, Disp: 1 each, Rfl: 0 .  Calcium Carbonate-Vit D-Min (CALCIUM 1200 PO), Take by mouth., Disp: , Rfl:  .  denosumab (PROLIA) 60 MG/ML SOLN injection, Inject 60 mg into the skin every 6 (six) months. Administer in upper arm, thigh, or abdomen, Disp: , Rfl:  .  pantoprazole (PROTONIX) 20 MG tablet, Take 1 tablet (20 mg total) by mouth daily., Disp: 90 tablet, Rfl: 1 .  propranolol (INDERAL) 10 MG tablet, TAKE 1 TABLET TWICE A DAY, Disp: 180 tablet, Rfl: 1 .  traZODone (DESYREL) 50 MG tablet, TAKE 1 TABLET AT BEDTIME, Disp: 90 tablet, Rfl: 1     Review of Systems     Objective:   Physical Exam  Constitutional: She is oriented to person, place, and time. She appears well-developed and well-nourished. No distress.  HENT:  Head: Normocephalic and atraumatic.  Right Ear: External ear normal.  Left Ear: External ear normal.  Mouth/Throat: Oropharynx is clear and moist. No oropharyngeal exudate.  Eyes: Conjunctivae and EOM are normal. Pupils are equal, round, and reactive to light. Right eye exhibits no discharge. Left eye exhibits no discharge. No scleral icterus.  Neck: Normal range of motion. Neck supple. No JVD present. No tracheal deviation present. No thyromegaly present.  Cardiovascular: Normal rate, regular rhythm, normal heart sounds and intact distal pulses.  Exam reveals no gallop and no friction rub.   No murmur heard. Pulmonary/Chest: Effort normal. No respiratory distress. She has no wheezes. She has rales. She exhibits no tenderness.  Abdominal: Soft. Bowel sounds are normal. She exhibits no distension and no mass. There is no  tenderness. There is no rebound and no guarding.  Musculoskeletal: Normal range of motion. She exhibits no edema or tenderness.  Lymphadenopathy:    She has no cervical adenopathy.  Neurological: She is alert and oriented to person, place, and time. She has normal reflexes. No cranial nerve deficit. She exhibits normal muscle tone. Coordination normal.  Skin: Skin is warm and dry. No rash noted. She is not diaphoretic. No erythema. No  pallor.  Psychiatric: She has a normal mood and affect. Her behavior is normal. Judgment and thought content normal.  Vitals reviewed.   Vitals:   11/18/16 1046  BP: (!) 104/58  Pulse: 77  SpO2: 98%  Weight: 119 lb (54 kg)  Height: _0  (1.727 m)   Body mass index is 18.09 kg/m.      Assessment:       ICD-9-CM ICD-10-CM   1. ILD (interstitial lung disease) ([place]) 515 J84.9 Pulmonary Function Test     CANCELED: Pulmonary Function Test  2. Coronary artery calcification seen on CAT scan 414.00 I25.10    Co art calcification - new problem     ILD - CT scan wise stable. Symptom wise better esp after dc nitrafurantoin. So do not think PFT is reliable. This is unilikely IPF but there is goriwng hypotehsis testing of anti-fibrotic agents in the non-IPF population. She is sekptical about the drugs due to side-effects. But if she were to decline then reclassify as IPF (radiology thinks CT is c/w UIP) and rediscuss antifibrotix Rx. She can follow with Dr Alva Garnet and she is agreeable to this plan  Have sent message to Dr Rockey Situ about coronary artery calcification  Plan:      Glad you are feeling better and energy levels improved  Plan  -= will inform Dr Rockey Situ about your coronary artery calcification  - in 3 months do Pre-bd spiro and dlco only. No lung volume or bd response at [facility] - respect desire to avoid esbriet/ofev as ofnow   folloowu  - Dr Alva Garnet in 3 months at [facility] - if PFTs declining - consider formal diagnosis of IPF and consideration of  esbriet/ofev anti-fibrotic therapy   Dr. Brand Males, M.D., [facility].C.P Pulmonary and Critical Care Medicine Staff Physician [place] [facility] Pager: [phone], If no answer or between  15:00h - 7:00h: call [phone]  11/19/2016 12:35 AM

## 2016-11-19 ENCOUNTER — Telehealth: Payer: Self-pay | Admitting: Internal Medicine

## 2016-11-19 DIAGNOSIS — J849 Interstitial pulmonary disease, unspecified: Secondary | ICD-10-CM | POA: Diagnosis not present

## 2016-11-19 DIAGNOSIS — J8410 Pulmonary fibrosis, unspecified: Secondary | ICD-10-CM

## 2016-11-19 DIAGNOSIS — J479 Bronchiectasis, uncomplicated: Secondary | ICD-10-CM

## 2016-11-19 NOTE — Progress Notes (Signed)
Daily Session Note  Patient Details  Name: Crystal Gregory MRN: [id number] Date of Birth: 1932-01-20 Referring Provider:   Flowsheet Row Pulmonary Rehab from 08/10/2016 in [facility]  Referring Provider  Ramaswamy      Encounter Date: 11/19/2016  Check In:     Session Check In - 11/19/16 1153      Check-In   Location [facility]-Cardiac & Pulmonary Rehab   Staff Present Gerlene Burdock, RN, Vickki Hearing, BA, ACSM CEP, Exercise Physiologist;Other  Darel Hong RN   Supervising physician immediately available to respond to emergencies [facility] immediately available ER MD   Physician(s) Darl Householder and [place]   Medication changes reported     No   Fall or balance concerns reported    No   Warm-up and Cool-down Performed as group-led instruction   Resistance Training Performed Yes   VAD Patient? No     Pain Assessment   Currently in Pain? No/denies   Multiple Pain Sites No         Goals Met:  Proper associated with RPD/PD & O2 Sat Independence with exercise equipment Exercise tolerated well Strength training completed today  Goals Unmet:  Not Applicable  Comments: Pt able to follow exercise prescription today without complaint.  Will continue to monitor for progression.    Dr. Emily Filbert is Medical Director for [place] and [facility].

## 2016-11-19 NOTE — Telephone Encounter (Signed)
Hi Tim  As you know Debbra Reich has seen you in past. She is concerned about findings about co art calcifcaition. I thought I wil mention to you in case she needs stress test  Thanks  Dr. Brand Males, M.D., [facility].C.P Pulmonary and Critical Care Medicine Staff Physician [place] Pulmonary and Critical Care Pager: [phone], If no answer or between  15:00h - 7:00h: call [phone]  11/19/2016 12:51 AM

## 2016-11-22 ENCOUNTER — Encounter: Payer: Self-pay | Admitting: Respiratory Therapy

## 2016-11-22 ENCOUNTER — Encounter: Payer: Medicare Other | Admitting: *Deleted

## 2016-11-22 DIAGNOSIS — J479 Bronchiectasis, uncomplicated: Secondary | ICD-10-CM

## 2016-11-22 DIAGNOSIS — J849 Interstitial pulmonary disease, unspecified: Secondary | ICD-10-CM | POA: Diagnosis not present

## 2016-11-22 DIAGNOSIS — J8410 Pulmonary fibrosis, unspecified: Secondary | ICD-10-CM

## 2016-11-22 NOTE — Progress Notes (Signed)
Discharge Summary  Patient Details  Name: Crystal Gregory MRN: [id number] Date of Birth: 1932-01-24 Referring Provider:   Flowsheet Row Pulmonary Rehab from 08/10/2016 in [facility]  Referring Provider  Ramaswamy       Number of Visits: 40  Reason for Discharge:  Patient reached a stable level of exercise. Patient independent in their exercise.  Smoking History:  History  Smoking Status   Former Smoker   Packs/day: 0.50   Years: 30.00   Quit date: 01/16/1981  Smokeless Tobacco   Never Used    Diagnosis:  Pulmonary fibrosis ([place])  Bronchiectasis without complication ([place])  ADL [facility]SD:     Pulmonary Assessment Scores    Row Name 08/10/16 1216 10/01/16 1324 11/17/16 1514     ADL UCSD   ADL Phase Entry Mid Exit   SOB Score total 64 50 22   Rest 1 0 1   Walk 3 2 1    Stairs 3 3 3    Bath 3 2 1    Dress 3 2 1    Shop 2 2 1       Initial Exercise Prescription:     Initial Exercise Prescription - 08/10/16 1200      Date of Initial Exercise RX and Referring Provider   Date 08/10/16   Referring Provider Ramaswamy     Treadmill   MPH 1.8   Grade 0.5   Minutes 15   METs 2.5     Recumbant Bike   Level 1   RPM 60   Minutes 15   METs 2     Recumbant Elliptical   Level 1   RPM 50   Minutes 15   METs 2     Elliptical   Level 1   Speed 2.5   Minutes 15   METs 2     Prescription Details   Frequency (times per week) 3   Duration Progress to 45 minutes of aerobic exercise without signs/symptoms of physical distress     Intensity   THRR 40-80% of Max Heartrate 106-125   Ratings of Perceived Exertion 11-15   Perceived Dyspnea 0-4     Progression   Progression Continue to progress workloads to maintain intensity without signs/symptoms of physical distress.     Resistance Training   Training Prescription Yes   Weight 2   Reps 10-12      Discharge Exercise Prescription (Final Exercise Prescription Changes):     Exercise  Prescription Changes - 11/18/16 1100      Response to Exercise   Blood Pressure (Admit) 124/64   Blood Pressure (Exercise) 132/70   Blood Pressure (Exit) 98/58   Heart Rate (Admit) 88 bpm   Heart Rate (Exercise) 110 bpm   Heart Rate (Exit) 77 bpm   Oxygen Saturation (Admit) 98 %   Oxygen Saturation (Exercise) 97 %   Oxygen Saturation (Exit) 98 %   Rating of Perceived Exertion (Exercise) 12   Perceived Dyspnea (Exercise) 2   Duration Progress to 45 minutes of aerobic exercise without signs/symptoms of physical distress   Intensity THRR unchanged     Progression   Progression Continue to progress workloads to maintain intensity without signs/symptoms of physical distress.     Resistance Training   Training Prescription Yes   Weight 3   Reps 10-12     Interval Training   Interval Training No     Treadmill   MPH 2.1   Grade 1.5   Minutes 15   METs 3.04  NuStep   Level 2   Minutes 15      Functional Capacity:     6 Minute Walk    Row Name 08/10/16 1234 10/01/16 1056 11/15/16 1202     6 Minute Walk   Phase  -- Mid Program Discharge   Distance 1094 feet 1165 feet 1267 feet   Distance % Change  --  -- 15.8 %   Walk Time 6 minutes 6 minutes 6 minutes   # of Rest Breaks 0 0 0   MPH 2.07  -- 2.4   METS 2.53 2.69 2.77   RPE 11 12 12    Perceived Dyspnea  3 3 2    VO2 Peak 7.78 9.42 9.7   Symptoms No  -- No   Resting HR 87 bpm 85 bpm 85 bpm   Resting BP 110/56 104/58 108/60   Max Ex. HR 103 bpm 98 bpm 116 bpm   Max Ex. BP 106/56 138/64 112/64     Interval HR   Baseline HR 87  -- 85   1 Minute HR 95 99 102   2 Minute HR 101 98 105   3 Minute HR 103 104 103   4 Minute HR 103 97 116   5 Minute HR 103 104 107   6 Minute HR 103 98 98   2 Minute Post HR 77 93  --   Interval Heart Rate? Yes  -- Yes     Interval Oxygen   Interval Oxygen? Yes  --  --   Baseline Oxygen Saturation % 97 % 97 %  all values on room air 97 %   1 Minute Oxygen Saturation % 97 % 97 %  97 %   2 Minute Oxygen Saturation % 98 % 98 % 96 %   3 Minute Oxygen Saturation % 98 % 96 % 96 %   4 Minute Oxygen Saturation % 98 % 97 % 97 %   5 Minute Oxygen Saturation % 96 % 95 % 96 %   6 Minute Oxygen Saturation % 98 % 97 % 100 %   2 Minute Post Oxygen Saturation % 99 % 98 %  --      Psychological, QOL, Others - Outcomes: PHQ 2/9: Depression screen [facility] 2/9 11/17/2016 08/10/2016  Decreased Interest 0 1  Down, Depressed, Hopeless 0 0  PHQ - 2 Score 0 1  Altered sleeping 0 1  Tired, decreased energy 1 3  Change in appetite 0 0  Feeling bad or failure about yourself  0 0  Trouble concentrating 0 0  Moving slowly or fidgety/restless 0 0  Suicidal thoughts 0 0  PHQ-9 Score 1 5    Quality of Life:     Quality of Life - 11/17/16 1515      Quality of Life Scores   Health/Function Pre 20.69 %   Health/Function Post 20.44 %   Health/Function % Change -1.21 %   Socioeconomic Pre 20.83 %   Socioeconomic Post 21 %   Socioeconomic % Change  0.82 %   Psych/Spiritual Pre 20.71 %   Psych/Spiritual Post 20.4 %   Psych/Spiritual % Change -1.5 %   Family Pre 21 %   Family Post 21 %   Family % Change 0 %   GLOBAL Pre 20.76 %   GLOBAL Post 21 %   GLOBAL % Change 1.16 %      Personal Goals: Goals established at orientation with interventions provided to work toward goal.  Personal Goals and Risk Factors at Admission - 08/10/16 1231      Core Components/Risk Factors/Patient Goals on Admission   Intervention --  Crystal Gregory would like to meet with the dietitian. She watches her diet and adds protein. Her BMI is 17.9 and normal range for her would be 18-24.   Goal Weight: Long Term 130 lb (59 kg)       Personal Goals Discharge:     Goals and Risk Factor Review    Row Name 08/16/16 1245 08/23/16 1251 09/08/16 1345 09/13/16 1535 10/11/16 1502     Core Components/Risk Factors/Patient Goals Review   Personal Goals Review Develop more efficient breathing techniques such as  purse lipped breathing and diaphragmatic breathing and practicing self-pacing with activity. Sedentary;Increase Strength and Stamina Sedentary;Increase Strength and Stamina;Develop more efficient breathing techniques such as purse lipped breathing and diaphragmatic breathing and practicing self-pacing with activity.;Improve shortness of breath with ADL's;Increase knowledge of respiratory medications and ability to use respiratory devices properly. Increase knowledge of respiratory medications and ability to use respiratory devices properly. Sedentary;Increase Strength and Stamina;Improve shortness of breath with ADL's;Increase knowledge of respiratory medications and ability to use respiratory devices properly.;Weight Management/Obesity;Develop more efficient breathing techniques such as purse lipped breathing and diaphragmatic breathing and practicing self-pacing with activity.   Review Pursed lip breathing technique was discussed with the patient and how to use it with exercise and ADL's. Patient demonstrated understanding of this technique.  Crystal Gregory has not been sore from beginning exercise.  She likes the warm up and strength exercises done in class. Her husband encourages her to exercise. Crystal Gregory has an appointment with her pulmonologist tomorrow. I sent with her her ITP for him to see her progress with her exercise goals. Crystal Gregory is so motivated to learn as much as she can about her bronchectasis and pulmonary fibrosis, so I did give her educational material about these diseases. Crystal Gregory is interested in meeting with the dietitian and has been increasing her protein with Ensure. She rarely uses her Albuterol., but performs the PLB which is helpful with activity. I talked to her about the [place] for use with bronchiectasis, and she is planning to talk to her physician about this therapy. Crystal Gregory was recently order Spiriva by Dr Chase Caller. He also ordered blood work, echo, and a CT of her lungs. She  will also have an appointment with Dr Alva Garnet for a local pulmonologist. Crystal Gregory had a new appointment with Dr Alva Garnet. Her cath was negative for heart disease, and her CT of her lungs was reviewed by Dr Alva Garnet. She has a repeat CT on 11/06/16. The physician was very positive about her pul fibrosis and bronchiectasis. Crystal Gregory takes care of herself by diet, exercise, and eatting properly. Crystal Gregory is to gain weight, but prefers not to meet with the dietitian. She has added a protein drink to her diet. She has a good understanding of her Spiriva and Albuterol MDI. She uses PLB with good technique. She attends regularly and enjoys the education.   Expected Outcomes Patient will use pursed lip breathing to help control SOB during exercise and ADL's.  Ammarie will continue to build strength and stamina. Continue to increase her exercise goals and learn more disease management skills. Take her new MDI, Spiriva correctly. Continue progressing with exercise goals in [facility] and increase education to manage her Pul Fibrosis and Bronchiectasis.   [place] Name 11/01/16 1112 11/03/16 1509 11/17/16 1524  Core Components/Risk Factors/Patient Goals Review   Personal Goals Review Weight Management/Obesity;Increase Strength and Stamina  -- Sedentary;Increase Strength and Stamina;Improve shortness of breath with ADL's;Develop more efficient breathing techniques such as purse lipped breathing and diaphragmatic breathing and practicing self-pacing with activity.;Increase knowledge of respiratory medications and ability to use respiratory devices properly.;Weight Management/Obesity     Review Crystal Gregory is currently at 119 lbs and her doctor wants her to get up to 125 lbs. She continues to use Ensure and protein to work toward this goal. She also stated that she enjoys the strengthening exercise in class and feels like they are getting a little easier as she gets stronger. [employer] information was given to her as an  option to continue with exercise once she graduates from [employer].  Discussed with Crystal Gregory her future appointment for a CT of the lungs and a PFT. Using her previous PFT, I educated her on the important results, such as FEV1, DLCO, especially with her IPF, and Lung Volumes.  She has a copy of her last test, and she can compare results - keeping them at least the same results. Crystal Gregory will graduate in 2 sessions. She has improved her exercise goals and has gain confidence with exercising with her Pulmonary Fibrosis and Bronchectasis. Crystal Gregory uses PLB and pacing. Her Shortness of Breath questionnaire has improved by 42 points - Minimal Importance Difference for shortness of breath is 5 points. Again, Crystal Gregory has gained more confidence in her breathing and has gained an awareness of her O2 Saturations. Her 59mwd has improved 18ft with her post walk - Minimal Importance Difference for IPF is 78.72-147.98ft. Crystal Gregory has a good understanding of her PFT's and Albuterol and Spiriva MDI's. Her diet goal is to gain weight - she has been supplementing her meals with Ensure. Crystal Gregory has attended regularly and has enjoyed the education.     Expected Outcomes Crystal Gregory will continue to consistantly come to exercise and will work towards her strengthening and weight goals.  Have a better understanding of her PFT's. Continue exercising at [facility] gym facility and continue self management of her Pulmonary fibrosis and Bronchectasis through the knowledge she has gained at [employer].        Nutrition & Weight - Outcomes:     Pre Biometrics - 08/10/16 1229      Pre Biometrics   Height 5' 8.25" (1.734 m)   Weight 118 lb 3.2 oz (53.6 kg)   Waist Circumference 26.75 inches   Hip Circumference 35.25 inches   Waist to Hip Ratio 0.76 %   BMI (Calculated) 17.9         Post Biometrics - 11/15/16 1201       Post  Biometrics   Height 5' 8.25" (1.734 m)   Weight 119 lb (54 kg)   Waist Circumference 25.75  inches   Hip Circumference 35.75 inches   Waist to Hip Ratio 0.72 %   BMI (Calculated) 18      Nutrition:   Nutrition Discharge:   Education Questionnaire Score:     Knowledge Questionnaire Score - 11/17/16 1514      Knowledge Questionnaire Score   Post Score 10/10      Goals reviewed with patient; copy given to patient.

## 2016-11-22 NOTE — Patient Instructions (Signed)
Discharge Instructions  Patient Details  Name: Crystal Gregory MRN: [id number] Date of Birth: [birth year] Referring Provider:  Dr Brand Males  Number of Visits: 36  Reason for Discharge:  Patient reached a stable level of exercise. Patient independent in their exercise.  Smoking History:  History  Smoking Status   Former Smoker   Packs/day: 0.50   Years: 30.00   Quit date: 01/16/1981  Smokeless Tobacco   Never Used    Diagnosis:  Pulmonary fibrosis ([place])  Bronchiectasis without complication ([place])  Initial Exercise Prescription:     Initial Exercise Prescription - 08/10/16 1200      Date of Initial Exercise RX and Referring Provider   Date 08/10/16   Referring Provider Ramaswamy     Treadmill   MPH 1.8   Grade 0.5   Minutes 15   METs 2.5     Recumbant Bike   Level 1   RPM 60   Minutes 15   METs 2     Recumbant Elliptical   Level 1   RPM 50   Minutes 15   METs 2     Elliptical   Level 1   Speed 2.5   Minutes 15   METs 2     Prescription Details   Frequency (times per week) 3   Duration Progress to 45 minutes of aerobic exercise without signs/symptoms of physical distress     Intensity   THRR 40-80% of Max Heartrate 106-125   Ratings of Perceived Exertion 11-15   Perceived Dyspnea 0-4     Progression   Progression Continue to progress workloads to maintain intensity without signs/symptoms of physical distress.     Resistance Training   Training Prescription Yes   Weight 2   Reps 10-12      Discharge Exercise Prescription (Final Exercise Prescription Changes):     Exercise Prescription Changes - 11/18/16 1100      Response to Exercise   Blood Pressure (Admit) 124/64   Blood Pressure (Exercise) 132/70   Blood Pressure (Exit) 98/58   Heart Rate (Admit) 88 bpm   Heart Rate (Exercise) 110 bpm   Heart Rate (Exit) 77 bpm   Oxygen Saturation (Admit) 98 %   Oxygen Saturation (Exercise) 97 %   Oxygen Saturation (Exit) 98 %    Rating of Perceived Exertion (Exercise) 12   Perceived Dyspnea (Exercise) 2   Duration Progress to 45 minutes of aerobic exercise without signs/symptoms of physical distress   Intensity THRR unchanged     Progression   Progression Continue to progress workloads to maintain intensity without signs/symptoms of physical distress.     Resistance Training   Training Prescription Yes   Weight 3   Reps 10-12     Interval Training   Interval Training No     Treadmill   MPH 2.1   Grade 1.5   Minutes 15   METs 3.04     NuStep   Level 2   Minutes 15      Functional Capacity:     6 Minute Walk    Row Name 08/10/16 1234 10/01/16 1056 11/15/16 1202     6 Minute Walk   Phase  -- Mid Program Discharge   Distance 1094 feet 1165 feet 1267 feet   Distance % Change  --  -- 15.8 %   Walk Time 6 minutes 6 minutes 6 minutes   # of Rest Breaks 0 0 0   MPH 2.07  -- 2.4   METS  2.53 2.69 2.77   RPE 11 12 12    Perceived Dyspnea  3 3 2    VO2 Peak 7.78 9.42 9.7   Symptoms No  -- No   Resting HR 87 bpm 85 bpm 85 bpm   Resting BP 110/56 104/58 108/60   Max Ex. HR 103 bpm 98 bpm 116 bpm   Max Ex. BP 106/56 138/64 112/64     Interval HR   Baseline HR 87  -- 85   1 Minute HR 95 99 102   2 Minute HR 101 98 105   3 Minute HR 103 104 103   4 Minute HR 103 97 116   5 Minute HR 103 104 107   6 Minute HR 103 98 98   2 Minute Post HR 77 93  --   Interval Heart Rate? Yes  -- Yes     Interval Oxygen   Interval Oxygen? Yes  --  --   Baseline Oxygen Saturation % 97 % 97 %  all values on room air 97 %   1 Minute Oxygen Saturation % 97 % 97 % 97 %   2 Minute Oxygen Saturation % 98 % 98 % 96 %   3 Minute Oxygen Saturation % 98 % 96 % 96 %   4 Minute Oxygen Saturation % 98 % 97 % 97 %   5 Minute Oxygen Saturation % 96 % 95 % 96 %   6 Minute Oxygen Saturation % 98 % 97 % 100 %   2 Minute Post Oxygen Saturation % 99 % 98 %  --      Quality of Life:     Quality of Life - 11/17/16 1515       Quality of Life Scores   Health/Function Pre 20.69 %   Health/Function Post 20.44 %   Health/Function % Change -1.21 %   Socioeconomic Pre 20.83 %   Socioeconomic Post 21 %   Socioeconomic % Change  0.82 %   Psych/Spiritual Pre 20.71 %   Psych/Spiritual Post 20.4 %   Psych/Spiritual % Change -1.5 %   Family Pre 21 %   Family Post 21 %   Family % Change 0 %   GLOBAL Pre 20.76 %   GLOBAL Post 21 %   GLOBAL % Change 1.16 %      Personal Goals: Goals established at orientation with interventions provided to work toward goal.     Personal Goals and Risk Factors at Admission - 08/10/16 1231      Core Components/Risk Factors/Patient Goals on Admission   Intervention --  Crystal Gregory would like to meet with the dietitian. She watches her diet and adds protein. Her BMI is 17.9 and normal range for her would be 18-24.   Goal Weight: Long Term 130 lb (59 kg)       Personal Goals Discharge:     Goals and Risk Factor Review - 11/17/16 1524      Core Components/Risk Factors/Patient Goals Review   Personal Goals Review Sedentary;Increase Strength and Stamina;Improve shortness of breath with ADL's;Develop more efficient breathing techniques such as purse lipped breathing and diaphragmatic breathing and practicing self-pacing with activity.;Increase knowledge of respiratory medications and ability to use respiratory devices properly.;Weight Management/Obesity   Review Crystal Gregory will graduate in 2 sessions. She has improved her exercise goals and has gain confidence with exercising with her Pulmonary Fibrosis and Bronchectasis. Crystal Gregory uses PLB and pacing. Her Shortness of Breath questionnaire has improved by 42  points - Minimal Importance Difference for shortness of breath is 5 points. Again, Crystal Gregory has gained more confidence in her breathing and has gained an awareness of her O2 Saturations. Her 64mwd has improved 167ft with her post walk - Minimal Importance Difference for IPF is  78.72-147.49ft. Crystal Gregory has a good understanding of her PFT's and Albuterol and Spiriva MDI's. Her diet goal is to gain weight - she has been supplementing her meals with Ensure. Crystal Gregory has attended regularly and has enjoyed the education.   Expected Outcomes Continue exercising at [facility] gym facility and continue self management of her Pulmonary fibrosis and Bronchectasis through the knowledge she has gained at [employer].      Nutrition & Weight - Outcomes:     Pre Biometrics - 08/10/16 1229      Pre Biometrics   Height 5' 8.25" (1.734 m)   Weight 118 lb 3.2 oz (53.6 kg)   Waist Circumference 26.75 inches   Hip Circumference 35.25 inches   Waist to Hip Ratio 0.76 %   BMI (Calculated) 17.9         Post Biometrics - 11/15/16 1201       Post  Biometrics   Height 5' 8.25" (1.734 m)   Weight 119 lb (54 kg)   Waist Circumference 25.75 inches   Hip Circumference 35.75 inches   Waist to Hip Ratio 0.72 %   BMI (Calculated) 18      Nutrition:   Nutrition Discharge:   Education Questionnaire Score:     Knowledge Questionnaire Score - 11/17/16 1514      Knowledge Questionnaire Score   Post Score 10/10      Goals reviewed with patient; copy given to patient.

## 2016-11-22 NOTE — Progress Notes (Signed)
Pulmonary Individual Treatment Plan  Patient Details  Name: Seraphina Mitchner MRN: [id number] Date of Birth: 1932/01/03 Referring Provider:   Flowsheet Row Pulmonary Rehab from 08/10/2016 in [facility] Cardiac and Pulmonary Rehab  Referring Provider  Ramaswamy      Initial Encounter Date:  Flowsheet Row Pulmonary Rehab from 08/10/2016 in [facility] Cardiac and Pulmonary Rehab  Date  08/10/16  Referring Provider  Ramaswamy      Visit Diagnosis: Pulmonary fibrosis ([place])  Bronchiectasis without complication ([place])  Patient's Home Medications on Admission:  Current Outpatient Prescriptions:    albuterol (PROVENTIL HFA;VENTOLIN HFA) 108 (90 Base) MCG/ACT inhaler, Inhale 2 puffs into the lungs every 6 (six) hours as needed., Disp: 1 Inhaler, Rfl: 0   AMBULATORY NON FORMULARY MEDICATION, Medication Name: Incentive Spirometry  Use 10-15 times daily, Disp: 1 each, Rfl: 0   Calcium Carbonate-Vit D-Min (CALCIUM 1200 PO), Take by mouth., Disp: , Rfl:    denosumab (PROLIA) 60 MG/ML SOLN injection, Inject 60 mg into the skin every 6 (six) months. Administer in upper arm, thigh, or abdomen, Disp: , Rfl:    pantoprazole (PROTONIX) 20 MG tablet, Take 1 tablet (20 mg total) by mouth daily., Disp: 90 tablet, Rfl: 1   propranolol (INDERAL) 10 MG tablet, TAKE 1 TABLET TWICE A DAY, Disp: 180 tablet, Rfl: 1   traZODone (DESYREL) 50 MG tablet, TAKE 1 TABLET AT BEDTIME, Disp: 90 tablet, Rfl: 1  Past Medical History: Past Medical History:  Diagnosis Date   Allergy    GERD (gastroesophageal reflux disease)    Septicemia ([place]) 1999   spent 4 weeks in ICU, intubated -? PNA    Tobacco Use: History  Smoking Status   Former Smoker   Packs/day: 0.50   Years: 30.00   Quit date: 01/16/1981  Smokeless Tobacco   Never Used    Labs: Recent Review Flowsheet Data    There is no flowsheet data to display.       ADL UCSD:     Pulmonary Assessment Scores    Row Name 08/10/16 1216 10/01/16 1324  11/17/16 1514     ADL UCSD   ADL Phase Entry Mid Exit   SOB Score total 64 50 22   Rest 1 0 1   Walk '3 2 1   '$ Stairs '3 3 3   '$ Bath '3 2 1   '$ Dress '3 2 1   '$ Shop '2 2 1      '$ Pulmonary Function Assessment:     Pulmonary Function Assessment - 08/10/16 1214      Pulmonary Function Tests   FVC% 80 %   FEV1% 63 %   FEV1/FVC Ratio 58     Initial Spirometry Results   Comments Test date 06/18/16     Breath   Bilateral Breath Sounds Clear   Shortness of Breath Yes;Limiting activity      Exercise Target Goals:    Exercise Program Goal: Individual exercise prescription set with THRR, safety & activity barriers. Participant demonstrates ability to understand and report RPE using BORG scale, to self-measure pulse accurately, and to acknowledge the importance of the exercise prescription.  Exercise Prescription Goal: Starting with aerobic activity 30 plus minutes a day, 3 days per week for initial exercise prescription. Provide home exercise prescription and guidelines that participant acknowledges understanding prior to discharge.  Activity Barriers & Risk Stratification:     Activity Barriers & Cardiac Risk Stratification - 08/10/16 1213      Activity Barriers & Cardiac Risk Stratification   Activity  Barriers Shortness of Breath;Deconditioning   Cardiac Risk Stratification Low      6 Minute Walk:     6 Minute Walk    Row Name 08/10/16 1234 10/01/16 1056 11/15/16 1202     6 Minute Walk   Phase  -- Mid Program Discharge   Distance 1094 feet 1165 feet 1267 feet   Distance % Change  --  -- 15.8 %   Walk Time 6 minutes 6 minutes 6 minutes   # of Rest Breaks 0 0 0   MPH 2.07  -- 2.4   METS 2.53 2.69 2.77   RPE '11 12 12   '$ Perceived Dyspnea  '3 3 2   '$ VO2 Peak 7.78 9.42 9.7   Symptoms No  -- No   Resting HR 87 bpm 85 bpm 85 bpm   Resting BP 110/56 104/58 108/60   Max Ex. HR 103 bpm 98 bpm 116 bpm   Max Ex. BP 106/56 138/64 112/64     Interval HR   Baseline HR 87  -- 85    1 Minute HR 95 99 102   2 Minute HR 101 98 105   3 Minute HR 103 104 103   4 Minute HR 103 97 116   5 Minute HR 103 104 107   6 Minute HR 103 98 98   2 Minute Post HR 77 93  --   Interval Heart Rate? Yes  -- Yes     Interval Oxygen   Interval Oxygen? Yes  --  --   Baseline Oxygen Saturation % 97 % 97 %  all values on room air 97 %   1 Minute Oxygen Saturation % 97 % 97 % 97 %   2 Minute Oxygen Saturation % 98 % 98 % 96 %   3 Minute Oxygen Saturation % 98 % 96 % 96 %   4 Minute Oxygen Saturation % 98 % 97 % 97 %   5 Minute Oxygen Saturation % 96 % 95 % 96 %   6 Minute Oxygen Saturation % 98 % 97 % 100 %   2 Minute Post Oxygen Saturation % 99 % 98 %  --      Initial Exercise Prescription:     Initial Exercise Prescription - 08/10/16 1200      Date of Initial Exercise RX and Referring Provider   Date 08/10/16   Referring Provider Ramaswamy     Treadmill   MPH 1.8   Grade 0.5   Minutes 15   METs 2.5     Recumbant Bike   Level 1   RPM 60   Minutes 15   METs 2     Recumbant Elliptical   Level 1   RPM 50   Minutes 15   METs 2     Elliptical   Level 1   Speed 2.5   Minutes 15   METs 2     Prescription Details   Frequency (times per week) 3   Duration Progress to 45 minutes of aerobic exercise without signs/symptoms of physical distress     Intensity   THRR 40-80% of Max Heartrate 106-125   Ratings of Perceived Exertion 11-15   Perceived Dyspnea 0-4     Progression   Progression Continue to progress workloads to maintain intensity without signs/symptoms of physical distress.     Resistance Training   Training Prescription Yes   Weight 2   Reps 10-12      Perform Capillary Blood  Glucose checks as needed.  Exercise Prescription Changes:     Exercise Prescription Changes    Row Name 08/16/16 1200 08/23/16 1200 08/24/16 1500 09/08/16 1300 09/23/16 1100     Exercise Review   Progression  --  -- Yes Yes Yes     Response to Exercise   Blood  Pressure (Admit) 126/60  -- 114/60 110/60 102/60   Blood Pressure (Exercise) 126/58  -- 124/82 132/64 136/70   Blood Pressure (Exit) 110/64  -- 122/70 90/52 102/48   Heart Rate (Admit) 78 bpm  -- 88 bpm 98 bpm 96 bpm   Heart Rate (Exercise) 109 bpm  -- 106 bpm 100 bpm 108 bpm   Heart Rate (Exit) 74 bpm  -- 79 bpm 74 bpm 86 bpm   Oxygen Saturation (Admit) 97 %  -- 96 % 98 % 98 %   Oxygen Saturation (Exercise) 94 %  -- 95 % 97 % 95 %   Oxygen Saturation (Exit) 98 %  -- 98 % 98 % 95 %   Rating of Perceived Exertion (Exercise) 13  -- '13 12 12   '$ Perceived Dyspnea (Exercise) 3  -- '3 3 3   '$ Duration  --  -- Progress to 45 minutes of aerobic exercise without signs/symptoms of physical distress Progress to 45 minutes of aerobic exercise without signs/symptoms of physical distress Progress to 45 minutes of aerobic exercise without signs/symptoms of physical distress   Intensity  --  -- THRR unchanged THRR unchanged THRR unchanged     Progression   Progression  --  --  --  -- Continue to progress workloads to maintain intensity without signs/symptoms of physical distress.     Resistance Training   Training Prescription Yes  -- Yes Yes Yes   Weight 2  -- '2 2 3   '$ Reps 10-12  -- 10-12 10-12 10-12     Interval Training   Interval Training  --  -- No No No     Treadmill   MPH 1.8  -- 1.8 1.8 2.1   Grade 0.5  -- 0.5 0.5 1   Minutes 15  -- '15 15 15   '$ METs 2.5  -- 2.5 2.5 2.9     Recumbant Bike   Level 1  --  --  --  --   RPM 60  --  --  --  --   Minutes 15  --  --  --  --   METs 2  --  --  --  --     NuStep   Level  --  -- '1 2 2   '$ Minutes  --  -- '15 15 15   '$ METs  --  --  -- 2  --     Recumbant Elliptical   Level 1  --  --  --  --   RPM 50  --  --  --  --   Minutes 15  --  --  --  --   METs 2  --  --  --  --     REL-XR   Level  --  -- '1 2 2   '$ Minutes  --  -- '15 15 15   '$ METs  --  --  -- 2.2 3.1     Home Exercise Plan   Plans to continue exercise at  -- Home  --  --  --   Frequency   -- Add 1 additional day to program exercise sessions.  --  --  --  [place] Name 10/07/16 1200 10/22/16 1000 11/04/16 1200 11/18/16 1100       Exercise Review   Progression Yes Yes  --  --      Response to Exercise   Blood Pressure (Admit) 114/68 128/60 98/62 124/64    Blood Pressure (Exercise) 126/64 142/84 120/60 132/70    Blood Pressure (Exit) 124/68 1'14/60 90/46 98/58 '$    Heart Rate (Admit) 80 bpm 75 bpm 90 bpm 88 bpm    Heart Rate (Exercise) 121 bpm 103 bpm 104 bpm 110 bpm    Heart Rate (Exit) 74 bpm 73 bpm 78 bpm 77 bpm    Oxygen Saturation (Admit) 98 % 99 % 97 % 98 %    Oxygen Saturation (Exercise) 92 % 97 % 96 % 97 %    Oxygen Saturation (Exit) 98 % 99 % 98 % 98 %    Rating of Perceived Exertion (Exercise) '12 12 12 12    '$ Perceived Dyspnea (Exercise) '3 3 2 2    '$ Duration Progress to 45 minutes of aerobic exercise without signs/symptoms of physical distress Progress to 45 minutes of aerobic exercise without signs/symptoms of physical distress Progress to 45 minutes of aerobic exercise without signs/symptoms of physical distress Progress to 45 minutes of aerobic exercise without signs/symptoms of physical distress    Intensity THRR unchanged THRR unchanged THRR unchanged THRR unchanged      Progression   Progression Continue to progress workloads to maintain intensity without signs/symptoms of physical distress. Continue to progress workloads to maintain intensity without signs/symptoms of physical distress. Continue to progress workloads to maintain intensity without signs/symptoms of physical distress. Continue to progress workloads to maintain intensity without signs/symptoms of physical distress.      Resistance Training   Training Prescription Yes Yes Yes Yes    Weight '3 3 3 3    '$ Reps 10-12 10-12 10-12 10-12      Interval Training   Interval Training No No No No      Treadmill   MPH 2 2.1 2.1 2.1    Grade 1 1.5 1.5 1.5    Minutes '15 15 15 15    '$ METs 2.81 3.04 3.04 3.04       NuStep   Level  -- '2 2 2    '$ Minutes  -- '15 15 15    '$ METs  -- 2.1 2.1  --      REL-XR   Level 2  --  --  --    Minutes 15  --  --  --    METs 3.1  --  --  --       Exercise Comments:     Exercise Comments    Row Name 08/16/16 1250 08/23/16 1253 08/24/16 1529 09/08/16 1338 09/23/16 1157   Exercise Comments First full day of exercise!  Patient was oriented to gym and equipment including functions, settings, policies, and procedures.  Patient's individual exercise prescription and treatment plan were reviewed.  All starting workloads were established based on the results of the 6 minute walk test done at initial orientation visit.  The plan for exercise progression was also introduced and progression will be customized based on patient's performance and goals. Brianca likes the warm up and strength exercises.  Home exercise was explained and she verbalized understanding of HR, RPE and safety. Marylyn is progressing well in her first week of exercise. Ferne is progressing well with exercise and has added resistance to NS and XR. Iyauna is progressing well with  exercise.   [place] Name 10/07/16 1238 10/22/16 1053 11/04/16 1249 11/18/16 1115 11/22/16 1124   Exercise Comments Sunni is doing well with exercise progression. Samai is progressing well with exercise. Janise reports feeling stronger and still feels the strength and stretch exercises are beneficial. Veronda has improved on her 6 min walk at mid program and again at discharge.  She is much more confident and comfortable with exercise than at the start of LW. Mya graduated today from cardiac rehab with 36 sessions completed.  Details of the patient's exercise prescription and what She needs to do in order to continue the prescription and progress were discussed with patient.  Patient was given a copy of prescription and goals.  Patient verbalized understanding.  Bradleigh plans to continue to exercise by exercising at her community's fitness  center.      Discharge Exercise Prescription (Final Exercise Prescription Changes):     Exercise Prescription Changes - 11/18/16 1100      Response to Exercise   Blood Pressure (Admit) 124/64   Blood Pressure (Exercise) 132/70   Blood Pressure (Exit) 98/58   Heart Rate (Admit) 88 bpm   Heart Rate (Exercise) 110 bpm   Heart Rate (Exit) 77 bpm   Oxygen Saturation (Admit) 98 %   Oxygen Saturation (Exercise) 97 %   Oxygen Saturation (Exit) 98 %   Rating of Perceived Exertion (Exercise) 12   Perceived Dyspnea (Exercise) 2   Duration Progress to 45 minutes of aerobic exercise without signs/symptoms of physical distress   Intensity THRR unchanged     Progression   Progression Continue to progress workloads to maintain intensity without signs/symptoms of physical distress.     Resistance Training   Training Prescription Yes   Weight 3   Reps 10-12     Interval Training   Interval Training No     Treadmill   MPH 2.1   Grade 1.5   Minutes 15   METs 3.04     NuStep   Level 2   Minutes 15       Nutrition:  Target Goals: Understanding of nutrition guidelines, daily intake of sodium '1500mg'$ , cholesterol '200mg'$ , calories 30% from fat and 7% or less from saturated fats, daily to have 5 or more servings of fruits and vegetables.  Biometrics:     Pre Biometrics - 08/10/16 1229      Pre Biometrics   Height 5' 8.25" (1.734 m)   Weight 118 lb 3.2 oz (53.6 kg)   Waist Circumference 26.75 inches   Hip Circumference 35.25 inches   Waist to Hip Ratio 0.76 %   BMI (Calculated) 17.9         Post Biometrics - 11/15/16 1201       Post  Biometrics   Height 5' 8.25" (1.734 m)   Weight 119 lb (54 kg)   Waist Circumference 25.75 inches   Hip Circumference 35.75 inches   Waist to Hip Ratio 0.72 %   BMI (Calculated) 18      Nutrition Therapy Plan and Nutrition Goals:   Nutrition Discharge: Rate Your Plate Scores:   Psychosocial: Target Goals: Acknowledge presence or  absence of depression, maximize coping skills, provide positive support system. Participant is able to verbalize types and ability to use techniques and skills needed for reducing stress and depression.  Initial Review & Psychosocial Screening:     Initial Psych Review & Screening - 08/10/16 [place]? Yes  Comments Ms Caver has good support from her husband and 2 children. They live at [facility] and enjoy the retirement community life. Recently diagnosed with pulmonary fibrosis has been a challenge, and she is looking forward to the [place] program for disease management skills.     Barriers   Psychosocial barriers to participate in program There are no identifiable barriers or psychosocial needs.;The patient should benefit from training in stress management and relaxation.     Screening Interventions   Interventions Encouraged to exercise      Quality of Life Scores:     Quality of Life - 11/17/16 1515      Quality of Life Scores   Health/Function Pre 20.69 %   Health/Function Post 20.44 %   Health/Function % Change -1.21 %   Socioeconomic Pre 20.83 %   Socioeconomic Post 21 %   Socioeconomic % Change  0.82 %   Psych/Spiritual Pre 20.71 %   Psych/Spiritual Post 20.4 %   Psych/Spiritual % Change -1.5 %   Family Pre 21 %   Family Post 21 %   Family % Change 0 %   GLOBAL Pre 20.76 %   GLOBAL Post 21 %   GLOBAL % Change 1.16 %      PHQ-9: Recent Review Flowsheet Data    Depression screen [facility] 2/9 11/17/2016 08/10/2016   Decreased Interest 0 1   Down, Depressed, Hopeless 0 0   PHQ - 2 Score 0 1   Altered sleeping 0 1   Tired, decreased energy 1 3   Change in appetite 0 0   Feeling bad or failure about yourself  0 0   Trouble concentrating 0 0   Moving slowly or fidgety/restless 0 0   Suicidal thoughts 0 0   PHQ-9 Score 1 5      Psychosocial Evaluation and Intervention:     Psychosocial Evaluation - 11/15/16 1129       Discharge Psychosocial Assessment & Intervention   Comments Counselor follow up with Ms. Joya Gaskins Curt Bears) for her discharge summary.  She reports standing betterr since coming into this program but admits she continues to experience shortness of breath, and realizes this is the nature of her diagnoses.  Her mood continues to be positive and her spouse reports she is more comfortable exercising in a gym environment now; which will benefit her follow up program back in her retirement community.   Paeton has spoken with the Wellness Mudlogger at her retirement gym and he will be developing a personalized work out plan for her upon discharge here.  She also has a neighbor who has the same diagnoses and is involved in several structured work out classes that Rayan may look into for herself.  Jennfer states the staff here at Pulmonary Rehab are so positive and helpful; as she had her BSN and recognizes competence "when she sees it."  Counselor commended Katrisha for all her hard work and positive attitude as well as her commitment to consistently exercise.      Psychosocial Re-Evaluation:     Psychosocial Re-Evaluation    [place] Name 09/01/16 1121 10/11/16 1517 11/08/16 1517         Psychosocial Re-Evaluation   Comments Counselor follow up with Ms. Joya Gaskins (Ms. W) today reporting she has realized how "out of shape" she was and is all the more committed to consistency in working out.  She hasn't recognized any progress yet as she has only been here two weeks and had to miss  several days last week due to holidays and guests in her home.  Counselor will continue to follow. Curt Bears psychosocial assessment reveals no barriers at this time to participation in Pulmonary Rehab.  She has good family and friend support that encourages Shannel to participate in [place] and progress with Her goals.  Jaclynn concerns are monitored, but she has acknowledge that attending the program has helped to maintain quality life  with improved mobility, self-care, and emotional and financial stability.  Arleta is commended for regular attendance and self-motivation to improve Her pulmonary disease management. During a recent appointment with her pulmonologist, she was very encouraged that due to her good diet, exercise, attitude, and family support, her physician stated her lung disease was very manageable. Curt Bears psychosocial assessment reveals no barriers at this time to participation in Pulmonary Rehab.  she has good family and friend support that encourages Shianne to participate in [place] and progress with Her goals.  Shaelyn concerns are monitored, but she has acknowledge that attending the program has helped to maintain quality life with improved mobility, self-care, and emotional and financial stability.  Makeila is commended for regular attendance and self-motivation to improve Her pulmonary disease management.       Education: Education Goals: Education classes will be provided on a weekly basis, covering required topics. Participant will state understanding/return demonstration of topics presented.  Learning Barriers/Preferences:     Learning Barriers/Preferences - 08/10/16 1214      Learning Barriers/Preferences   Learning Barriers None   Learning Preferences None      Education Topics: Initial Evaluation Education: - Verbal, written and demonstration of respiratory meds, RPE/PD scales, oximetry and breathing techniques. Instruction on use of nebulizers and MDIs: cleaning and proper use, rinsing mouth with steroid doses and importance of monitoring MDI activations. Flowsheet Row Pulmonary Rehab from 11/19/2016 in [facility] Cardiac and Pulmonary Rehab  Date  08/10/16  Educator  LB  Instruction Review Code  2- meets goals/outcomes      General Nutrition Guidelines/Fats and Fiber: -Group instruction provided by verbal, written material, models and posters to present the general guidelines for heart healthy  nutrition. Gives an explanation and review of dietary fats and fiber. Flowsheet Row Pulmonary Rehab from 11/19/2016 in [facility] Cardiac and Pulmonary Rehab  Date  08/23/16  Educator  CR  Instruction Review Code  2- meets goals/outcomes      Controlling Sodium/Reading Food Labels: -Group verbal and written material supporting the discussion of sodium use in heart healthy nutrition. Review and explanation with models, verbal and written materials for utilization of the food label. Flowsheet Row Pulmonary Rehab from 11/19/2016 in [facility] Cardiac and Pulmonary Rehab  Date  08/30/16  Educator  CR  Instruction Review Code  2- meets goals/outcomes      Exercise Physiology & Risk Factors: - Group verbal and written instruction with models to review the exercise physiology of the cardiovascular system and associated critical values. Details cardiovascular disease risk factors and the goals associated with each risk factor. Flowsheet Row Pulmonary Rehab from 11/19/2016 in [facility] Cardiac and Pulmonary Rehab  Date  10/06/16  Educator  [facility]  Instruction Review Code  2- meets goals/outcomes      Aerobic Exercise & Resistance Training: - Gives group verbal and written discussion on the health impact of inactivity. On the components of aerobic and resistive training programs and the benefits of this training and how to safely progress through these programs. Flowsheet Row Pulmonary Rehab from 11/19/2016 in [facility] Cardiac and Pulmonary  Rehab  Date  11/03/16  Educator  [facility]  Instruction Review Code  2- meets goals/outcomes      Flexibility, Balance, General Exercise Guidelines: - Provides group verbal and written instruction on the benefits of flexibility and balance training programs. Provides general exercise guidelines with specific guidelines to those with heart or lung disease. Demonstration and skill practice provided. Flowsheet Row Pulmonary Rehab from 11/19/2016 in [facility] Cardiac and Pulmonary Rehab  Date   09/22/16  Educator  [facility]  Instruction Review Code  2- meets goals/outcomes      Stress Management: - Provides group verbal and written instruction about the health risks of elevated stress, cause of high stress, and healthy ways to reduce stress.   Depression: - Provides group verbal and written instruction on the correlation between heart/lung disease and depressed mood, treatment options, and the stigmas associated with seeking treatment. Flowsheet Row Pulmonary Rehab from 11/19/2016 in [facility] Cardiac and Pulmonary Rehab  Date  09/06/16  Educator  [facility]  Instruction Review Code  2- meets goals/outcomes      Exercise & Equipment Safety: - Individual verbal instruction and demonstration of equipment use and safety with use of the equipment. Flowsheet Row Pulmonary Rehab from 11/19/2016 in [facility] Cardiac and Pulmonary Rehab  Date  08/16/16  Educator  AS  Instruction Review Code  2- meets goals/outcomes      Infection Prevention: - Provides verbal and written material to individual with discussion of infection control including proper hand washing and proper equipment cleaning during exercise session. Flowsheet Row Pulmonary Rehab from 11/19/2016 in [facility] Cardiac and Pulmonary Rehab  Date  08/16/16  Educator  AS  Instruction Review Code  2- meets goals/outcomes      Falls Prevention: - Provides verbal and written material to individual with discussion of falls prevention and safety. Flowsheet Row Pulmonary Rehab from 11/19/2016 in [facility] Cardiac and Pulmonary Rehab  Date  08/10/16  Educator  LB  Instruction Review Code  2- meets goals/outcomes      Diabetes: - Individual verbal and written instruction to review signs/symptoms of diabetes, desired ranges of glucose level fasting, after meals and with exercise. Advice that pre and post exercise glucose checks will be done for 3 sessions at entry of program.   Chronic Lung Diseases: - Group verbal and written instruction to review  new updates, new respiratory medications, new advancements in procedures and treatments. Provide informative websites and "800" numbers of self-education. Flowsheet Row Pulmonary Rehab from 11/19/2016 in [facility] Cardiac and Pulmonary Rehab  Date  10/27/16  Educator  LB  Instruction Review Code  2- meets goals/outcomes      Lung Procedures: - Group verbal and written instruction to describe testing methods done to diagnose lung disease. Review the outcome of test results. Describe the treatment choices: Pulmonary Function Tests, ABGs and oximetry.   Energy Conservation: - Provide group verbal and written instruction for methods to conserve energy, plan and organize activities. Instruct on pacing techniques, use of adaptive equipment and posture/positioning to relieve shortness of breath.   Triggers: - Group verbal and written instruction to review types of environmental controls: home humidity, furnaces, filters, dust mite/pet prevention, HEPA vacuums. To discuss weather changes, air quality and the benefits of nasal washing. Flowsheet Row Pulmonary Rehab from 11/19/2016 in [facility] Cardiac and Pulmonary Rehab  Date  09/29/16  Educator  LB  Instruction Review Code  2- meets goals/outcomes      Exacerbations: - Group verbal and written instruction to provide: warning signs,  infection symptoms, calling MD promptly, preventive modes, and value of vaccinations. Review: effective airway clearance, coughing and/or vibration techniques. Create an Sports administrator. Flowsheet Row Pulmonary Rehab from 11/19/2016 in [facility] Cardiac and Pulmonary Rehab  Date  09/08/16  Educator  LB  Instruction Review Code  2- meets goals/outcomes      Oxygen: - Individual and group verbal and written instruction on oxygen therapy. Includes supplement oxygen, available portable oxygen systems, continuous and intermittent flow rates, oxygen safety, concentrators, and Medicare reimbursement for oxygen.   Respiratory  Medications: - Group verbal and written instruction to review medications for lung disease. Drug class, frequency, complications, importance of spacers, rinsing mouth after steroid MDI's, and proper cleaning methods for nebulizers. Flowsheet Row Pulmonary Rehab from 11/19/2016 in [facility] Cardiac and Pulmonary Rehab  Date  08/10/16  Educator  LB  Instruction Review Code  2- meets goals/outcomes      AED/CPR: - Group verbal and written instruction with the use of models to demonstrate the basic use of the AED with the basic ABC's of resuscitation.   Breathing Retraining: - Provides individuals verbal and written instruction on purpose, frequency, and proper technique of diaphragmatic breathing and pursed-lipped breathing. Applies individual practice skills. Flowsheet Row Pulmonary Rehab from 11/19/2016 in [facility] Cardiac and Pulmonary Rehab  Date  08/16/16  Educator  AS  Instruction Review Code  2- meets goals/outcomes      Anatomy and Physiology of the Lungs: - Group verbal and written instruction with the use of models to provide basic lung anatomy and physiology related to function, structure and complications of lung disease. Flowsheet Row Pulmonary Rehab from 11/19/2016 in [facility] Cardiac and Pulmonary Rehab  Date  11/19/16  Educator  CE  Instruction Review Code  2- meets goals/outcomes      Heart Failure: - Group verbal and written instruction on the basics of heart failure: signs/symptoms, treatments, explanation of ejection fraction, enlarged heart and cardiomyopathy. Flowsheet Row Pulmonary Rehab from 11/19/2016 in [facility] Cardiac and Pulmonary Rehab  Date  11/19/16  Educator  CE  Instruction Review Code  2- meets goals/outcomes      Sleep Apnea: - Individual verbal and written instruction to review Obstructive Sleep Apnea. Review of risk factors, methods for diagnosing and types of masks and machines for OSA.   Anxiety: - Provides group, verbal and written instruction on the  correlation between heart/lung disease and anxiety, treatment options, and management of anxiety. Flowsheet Row Pulmonary Rehab from 11/19/2016 in [facility] Cardiac and Pulmonary Rehab  Date  10/13/16  Educator  [facility]  Instruction Review Code  2- Meets goals/outcomes      Relaxation: - Provides group, verbal and written instruction about the benefits of relaxation for patients with heart/lung disease. Also provides patients with examples of relaxation techniques. Flowsheet Row Pulmonary Rehab from 11/19/2016 in [facility] Cardiac and Pulmonary Rehab  Date  11/10/16  Educator  [facility]  Instruction Review Code  2- Meets goals/outcomes      Knowledge Questionnaire Score:     Knowledge Questionnaire Score - 11/17/16 1514      Knowledge Questionnaire Score   Post Score 10/10       Core Components/Risk Factors/Patient Goals at Admission:     Personal Goals and Risk Factors at Admission - 08/10/16 1231      Core Components/Risk Factors/Patient Goals on Admission   Intervention --  Ms Vogelgesang would like to meet with the dietitian. She watches her diet and adds protein. Her BMI is 17.9 and normal  range for her would be 18-24.   Goal Weight: Long Term 130 lb (59 kg)      Core Components/Risk Factors/Patient Goals Review:      Goals and Risk Factor Review    Row Name 08/16/16 1245 08/23/16 1251 09/08/16 1345 09/13/16 1535 10/11/16 1502     Core Components/Risk Factors/Patient Goals Review   Personal Goals Review Develop more efficient breathing techniques such as purse lipped breathing and diaphragmatic breathing and practicing self-pacing with activity. Sedentary;Increase Strength and Stamina Sedentary;Increase Strength and Stamina;Develop more efficient breathing techniques such as purse lipped breathing and diaphragmatic breathing and practicing self-pacing with activity.;Improve shortness of breath with ADL's;Increase knowledge of respiratory medications and ability to use respiratory devices  properly. Increase knowledge of respiratory medications and ability to use respiratory devices properly. Sedentary;Increase Strength and Stamina;Improve shortness of breath with ADL's;Increase knowledge of respiratory medications and ability to use respiratory devices properly.;Weight Management/Obesity;Develop more efficient breathing techniques such as purse lipped breathing and diaphragmatic breathing and practicing self-pacing with activity.   Review Pursed lip breathing technique was discussed with the patient and how to use it with exercise and ADL's. Patient demonstrated understanding of this technique.  Lashone has not been sore from beginning exercise.  She likes the warm up and strength exercises done in class. Her husband encourages her to exercise. Ms Trager has an appointment with her pulmonologist tomorrow. I sent with her her ITP for him to see her progress with her exercise goals. Ms Trigueros is so motivated to learn as much as she can about her bronchectasis and pulmonary fibrosis, so I did give her educational material about these diseases. Ms schmuck is interested in meeting with the dietitian and has been increasing her protein with Ensure. She rarely uses her Albuterol., but performs the PLB which is helpful with activity. I talked to her about the [place] for use with bronchiectasis, and she is planning to talk to her physician about this therapy. Ms Natzke was recently order Spiriva by Dr Chase Caller. He also ordered blood work, echo, and a CT of her lungs. She will also have an appointment with Dr Alva Garnet for a local pulmonologist. Ms Clendenen had a new appointment with Dr Alva Garnet. Her cath was negative for heart disease, and her CT of her lungs was reviewed by Dr Alva Garnet. She has a repeat CT on 11/06/16. The physician was very positive about her pul fibrosis and bronchiectasis. Mr Dicola takes care of herself by diet, exercise, and eatting properly. Ms Bohlman is to gain weight, but prefers not to  meet with the dietitian. She has added a protein drink to her diet. She has a good understanding of her Spiriva and Albuterol MDI. She uses PLB with good technique. She attends regularly and enjoys the education.   Expected Outcomes Patient will use pursed lip breathing to help control SOB during exercise and ADL's.  Willye will continue to build strength and stamina. Continue to increase her exercise goals and learn more disease management skills. Take her new MDI, Spiriva correctly. Continue progressing with exercise goals in [facility] and increase education to manage her Pul Fibrosis and Bronchiectasis.   [place] Name 11/01/16 1112 11/03/16 1509 11/17/16 1524         Core Components/Risk Factors/Patient Goals Review   Personal Goals Review Weight Management/Obesity;Increase Strength and Stamina  -- Sedentary;Increase Strength and Stamina;Improve shortness of breath with ADL's;Develop more efficient breathing techniques such as purse lipped breathing and diaphragmatic breathing and practicing self-pacing with activity.;Increase knowledge of  respiratory medications and ability to use respiratory devices properly.;Weight Management/Obesity     Review Janique is currently at 119 lbs and her doctor wants her to get up to 125 lbs. She continues to use Ensure and protein to work toward this goal. She also stated that she enjoys the strengthening exercise in class and feels like they are getting a little easier as she gets stronger. [employer] information was given to her as an option to continue with exercise once she graduates from [employer].  Discussed with Ms Lung her future appointment for a CT of the lungs and a PFT. Using her previous PFT, I educated her on the important results, such as FEV1, DLCO, especially with her IPF, and Lung Volumes.  She has a copy of her last test, and she can compare results - keeping them at least the same results. Ms Elsasser will graduate in 2 sessions. She has improved her  exercise goals and has gain confidence with exercising with her Pulmonary Fibrosis and Bronchectasis. Ms Devoss uses PLB and pacing. Her Shortness of Breath questionnaire has improved by 42 points - Minimal Importance Difference for shortness of breath is 5 points. Again, Ms Gohlke has gained more confidence in her breathing and has gained an awareness of her O2 Saturations. Her 74md has improved 1728fwith her post walk - Minimal Importance Difference for IPF is 78.72-147.25f92fMs WriMilbraths a good understanding of her PFT's and Albuterol and Spiriva MDI's. Her diet goal is to gain weight - she has been supplementing her meals with Ensure. Ms WriRuggerios attended regularly and has enjoyed the education.     Expected Outcomes KatLyndel Pleasurell continue to consistantly come to exercise and will work towards her strengthening and weight goals.  Have a better understanding of her PFT's. [facility] and continue self management of her Pulmonary fibrosis and Bronchectasis through the knowledge she has gained at [facility]      Core Components/Risk Factors/Patient Goals at Discharge (Final Review):      Goals and Risk Factor Review - 11/17/16 1524      Core Components/Risk Factors/Patient Goals Review   Personal Goals Review Sedentary;Increase Strength and Stamina;Improve shortness of breath with ADL's;Develop more efficient breathing techniques such as purse lipped breathing and diaphragmatic breathing and practicing self-pacing with activity.;Increase knowledge of respiratory medications and ability to use respiratory devices properly.;Weight Management/Obesity   Review Ms WriZindall graduate in 2 sessions. She has improved her exercise goals and has gain confidence with exercising with her Pulmonary Fibrosis and Bronchectasis. Ms WriOrzeles PLB and pacing. Her Shortness of Breath questionnaire has improved by 42 points - Minimal Importance Difference for shortness of breath  is 5 points. Again, Ms WriBettens gained more confidence in her breathing and has gained an awareness of her O2 Saturations. Her 6mw100mas improved 173ft54fh her post walk - Minimal Importance Difference for IPF is 78.72-147.25ft. 51fWrightWiens good understanding of her PFT's and Albuterol and Spiriva MDI's. Her diet goal is to gain weight - she has been supplementing her meals with Ensure. Ms WrightHalbleibttended regularly and has enjoyed the education.   Expected Outcomes [facility] and continue self management of her Pulmonary fibrosis and Bronchectasis through the knowledge she has gained at [facility] ITP Comments:     ITP Comments    Row Name 09/03/16 1102  ITP Comments "Know Your Numbers" education was completed today with learning goals met.           Comments:  Lupe graduated today from cardiac rehab with 36 sessions completed.  Details of the patient's exercise prescription and what She needs to do in order to continue the prescription and progress were discussed with patient.  Patient was given a copy of prescription and goals.  Patient verbalized understanding.  Weston plans to continue to exercise by at [facility].

## 2016-11-22 NOTE — Progress Notes (Signed)
Daily Session Note  Patient Details  Name: Crystal Gregory MRN: [id number] Date of Birth: 1932/01/12 Referring Provider:   Flowsheet Row Pulmonary Rehab from 08/10/2016 in [facility] Cardiac and Pulmonary Rehab  Referring Provider  Ramaswamy      Encounter Date: 11/22/2016  Check In:     Session Check In - 11/22/16 1016      Check-In   Location [facility]-Cardiac & Pulmonary Rehab   Staff Present Nada Maclachlan, BA, ACSM CEP, Exercise Physiologist;Laureen Owens Shark, BS, RRT, Respiratory Bertis Ruddy, BS, ACSM CEP, Exercise Physiologist   Supervising physician immediately available to respond to emergencies [facility] immediately available ER MD   Physician(s) Alfred Levins and Corky Downs   Medication changes reported     No   Fall or balance concerns reported    No   Warm-up and Cool-down Performed on first and last piece of equipment   Resistance Training Performed Yes   VAD Patient? No     Pain Assessment   Currently in Pain? No/denies   Multiple Pain Sites No         Goals Met:  Proper associated with RPD/PD & O2 Sat Independence with exercise equipment Exercise tolerated well Personal goals reviewed Strength training completed today  Goals Unmet:  Not Applicable  Comments:  Crystal Gregory graduated today from cardiac rehab with 36 sessions completed.  Details of the patient's exercise prescription and what She needs to do in order to continue the prescription and progress were discussed with patient.  Patient was given a copy of prescription and goals.  Patient verbalized understanding.  Crystal Gregory plans to continue to exercise by exercising at her community's fitness center.     Dr. Emily Filbert is Medical Director for [name] and [facility] Pulmonary Rehabilitation.

## 2016-11-23 ENCOUNTER — Other Ambulatory Visit: Payer: Self-pay | Admitting: Internal Medicine

## 2016-11-23 DIAGNOSIS — J849 Interstitial pulmonary disease, unspecified: Secondary | ICD-10-CM

## 2016-12-08 ENCOUNTER — Telehealth: Payer: Self-pay | Admitting: Internal Medicine

## 2016-12-08 NOTE — Telephone Encounter (Signed)
Let Crystal Gregory know that comparison between nov 2017 pft and feb 2018 pft is hard becuas there is some effort quality issues with nov 2017 pft and she should d/w Dr Alva Garnet aobut this  Dr. Brand Males, M.D., [facility].C.P Pulmonary and Critical Care Medicine Staff Physician [facility] Pager: [phone], If no answer or between  15:00h - 7:00h: call [phone]  12/08/2016 9:19 PM

## 2016-12-09 NOTE — Telephone Encounter (Signed)
lmtcb for pt.  

## 2016-12-10 NOTE — Telephone Encounter (Signed)
lmtcb for pt.  

## 2016-12-13 NOTE — Telephone Encounter (Signed)
[phone] calling back

## 2016-12-13 NOTE — Telephone Encounter (Signed)
lmtcb x1 for pt. 

## 2016-12-14 NOTE — Telephone Encounter (Signed)
[phone] calling back

## 2016-12-15 NOTE — Telephone Encounter (Signed)
Pt is aware of results. Nothing more needed at this time.

## 2016-12-28 ENCOUNTER — Encounter: Payer: Self-pay | Admitting: Family Medicine

## 2016-12-28 MED ORDER — PANTOPRAZOLE SODIUM 20 MG PO TBEC: 20.0000 mg | DELAYED_RELEASE_TABLET | Freq: Every day | ORAL | 1 refills | Status: DC

## 2016-12-28 MED ORDER — TRAZODONE HCL 50 MG PO TABS: ORAL_TABLET | ORAL | 1 refills | Status: DC

## 2016-12-28 MED ORDER — PROPRANOLOL HCL 10 MG PO TABS: ORAL_TABLET | ORAL | 1 refills | Status: DC

## 2016-12-30 ENCOUNTER — Telehealth: Payer: Self-pay | Admitting: *Deleted

## 2016-12-30 NOTE — Telephone Encounter (Signed)
Notified pt about the approval for prolia...scheduled for calcium lab 01/05/17, prolia shot 01/12/17

## 2016-12-30 NOTE — Telephone Encounter (Signed)
Verification of benefits have been processed and an approval has been received for pts prolia injection. Pts estimated cost are appx $0. This is only an estimate and cannot be confirmed until benefits are paid. Please advise pt and schedule if needed. If scheduled, once the injection is received, pls contact me back with the date it was received so that I am able to update prolia folder. thanks   Please contact pt and schedule Ca lab and injection one week following as she is past due

## 2017-01-04 ENCOUNTER — Other Ambulatory Visit: Payer: Self-pay | Admitting: Family Medicine

## 2017-01-04 DIAGNOSIS — M810 Age-related osteoporosis without current pathological fracture: Secondary | ICD-10-CM

## 2017-01-05 ENCOUNTER — Other Ambulatory Visit (INDEPENDENT_AMBULATORY_CARE_PROVIDER_SITE_OTHER): Payer: Medicare Other

## 2017-01-05 DIAGNOSIS — M810 Age-related osteoporosis without current pathological fracture: Secondary | ICD-10-CM | POA: Diagnosis not present

## 2017-01-05 LAB — CALCIUM: Calcium: 9.3 mg/dL (ref 8.4–10.5)

## 2017-01-10 ENCOUNTER — Ambulatory Visit (INDEPENDENT_AMBULATORY_CARE_PROVIDER_SITE_OTHER): Payer: Medicare Other | Admitting: Family Medicine

## 2017-01-10 DIAGNOSIS — H9190 Unspecified hearing loss, unspecified ear: Secondary | ICD-10-CM | POA: Diagnosis not present

## 2017-01-10 DIAGNOSIS — I2510 Atherosclerotic heart disease of native coronary artery without angina pectoris: Secondary | ICD-10-CM

## 2017-01-10 NOTE — Patient Instructions (Signed)
Great to see you. Please stop by to see [name] on your way out.   

## 2017-01-10 NOTE — Progress Notes (Signed)
Subjective:   Patient ID: Crystal Gregory, female    DOB: 08/08/1932, 83 y.o.   MRN: [id number]  Crystal Gregory is a pleasant 83 y.o. year old female who presents to clinic today with decreased hearing  on 01/10/2017  HPI:  Has noticed she has a harder time following conversations due to decreased hearing.  Wants me to look in her ears to look for wax and if no wax, refer to audiology.  No ear pain.  No trauma.  Current Outpatient Prescriptions on File Prior to Visit  Medication Sig Dispense Refill  . AMBULATORY NON FORMULARY MEDICATION Medication Name: Incentive Spirometry  Use 10-15 times daily 1 each 0  . Calcium Carbonate-Vit D-Min (CALCIUM 1200 PO) Take by mouth.    . denosumab (PROLIA) 60 MG/ML SOLN injection Inject 60 mg into the skin every 6 (six) months. Administer in upper arm, thigh, or abdomen    . pantoprazole (PROTONIX) 20 MG tablet Take 1 tablet (20 mg total) by mouth daily. 90 tablet 1  . propranolol (INDERAL) 10 MG tablet TAKE 1 TABLET TWICE A DAY 180 tablet 1  . traZODone (DESYREL) 50 MG tablet TAKE 1 TABLET AT BEDTIME 90 tablet 1   No current facility-administered medications on file prior to visit.     No Known Allergies  Past Medical History:  Diagnosis Date  . Allergy   . GERD (gastroesophageal reflux disease)   . Septicemia ([place]) 1999   spent 4 weeks in ICU, intubated -? PNA    Past Surgical History:  Procedure Laterality Date  . ABDOMINAL HYSTERECTOMY    . FLEXIBLE BRONCHOSCOPY N/A 07/05/2016   Procedure: FLEXIBLE BRONCHOSCOPY;  Surgeon: Vilinda Boehringer, MD;  Location: [facility] ORS;  Service: Cardiopulmonary;  Laterality: N/A;    Family History  Problem Relation Age of Onset  . Parkinson's disease Mother   . Colon polyps Mother 45       . Breast cancer Sister     Social History   Social History  . Marital status: Married    Spouse name: N/A  . Number of children: 2  . Years of education: N/A   Occupational History  . Retired     Social  History Main Topics  . Smoking status: Former Smoker    Packs/day: 0.50    Years: 30.00    Quit date: 01/16/1981  . Smokeless tobacco: Never Used  . Alcohol use Yes     Comment: 2 drinks daily   . Drug use: No  . Sexual activity: Not on file   Other Topics Concern  . Not on file   Social History Narrative   Married.     2 children.  Lives with her husband in [place].   The PMH, PSH, Social History, Family History, Medications, and allergies have been reviewed in [facility], and have been updated if relevant.  Review of Systems  Constitutional: Negative.   HENT: Positive for hearing loss. Negative for congestion, dental problem, drooling, ear discharge, ear pain and facial swelling.   All other systems reviewed and are negative.      Objective:    BP 102/62   Pulse 80   Wt 122 lb 8 oz (55.6 kg)   SpO2 99%   BMI 18.63 kg/m   Wt Readings from Last 3 Encounters:  01/10/17 122 lb 8 oz (55.6 kg)  11/18/16 119 lb (54 kg)  11/15/16 119 lb (54 kg)    Physical Exam  Constitutional: She is oriented to person, place,  and time. She appears well-developed and well-nourished.  HENT:  Head: Normocephalic.  Right Ear: Hearing and tympanic membrane normal.  Left Ear: Hearing and tympanic membrane normal.  Eyes: Conjunctivae are normal.  Cardiovascular: Normal rate.   Pulmonary/Chest: Effort normal.  Neurological: She is alert and oriented to person, place, and time. No cranial nerve deficit.  Skin: Skin is warm and dry.  Psychiatric: She has a normal mood and affect. Her behavior is normal. Judgment and thought content normal.  Nursing note and vitals reviewed.         Assessment & Plan:   Decreased hearing, unspecified laterality - Plan: Ambulatory referral to Audiology No Follow-up on file.

## 2017-01-10 NOTE — Assessment & Plan Note (Signed)
No cerumen on exam. Refer to audiology for hearing test and hearing aid fitting if appropriate. The patient indicates understanding of these issues and agrees with the plan.

## 2017-01-12 ENCOUNTER — Ambulatory Visit (INDEPENDENT_AMBULATORY_CARE_PROVIDER_SITE_OTHER): Payer: Medicare Other | Admitting: *Deleted

## 2017-01-12 DIAGNOSIS — M810 Age-related osteoporosis without current pathological fracture: Secondary | ICD-10-CM

## 2017-01-12 MED ORDER — DENOSUMAB 60 MG/ML ~~LOC~~ SOLN
60.0000 mg | Freq: Once | SUBCUTANEOUS | Status: AC
  Administered 2017-01-12: 60 mg via SUBCUTANEOUS

## 2017-01-14 DIAGNOSIS — H903 Sensorineural hearing loss, bilateral: Secondary | ICD-10-CM | POA: Diagnosis not present

## 2017-02-08 ENCOUNTER — Telehealth: Payer: Self-pay | Admitting: Family Medicine

## 2017-02-08 NOTE — Telephone Encounter (Signed)
Pt declined to schedule AWV °

## 2017-02-10 ENCOUNTER — Ambulatory Visit: Payer: Medicare Other | Attending: Internal Medicine

## 2017-02-10 DIAGNOSIS — J849 Interstitial pulmonary disease, unspecified: Secondary | ICD-10-CM

## 2017-02-10 DIAGNOSIS — J984 Other disorders of lung: Secondary | ICD-10-CM | POA: Diagnosis not present

## 2017-02-25 ENCOUNTER — Ambulatory Visit (INDEPENDENT_AMBULATORY_CARE_PROVIDER_SITE_OTHER): Payer: Medicare Other | Admitting: Pulmonary Disease

## 2017-02-25 ENCOUNTER — Ambulatory Visit
Admission: RE | Admit: 2017-02-25 | Discharge: 2017-02-25 | Disposition: A | Discharge status: Home / Self Care | Payer: Medicare Other | Source: Ambulatory Visit | Attending: Pulmonary Disease | Admitting: Pulmonary Disease

## 2017-02-25 ENCOUNTER — Encounter: Payer: Self-pay | Admitting: Pulmonary Disease

## 2017-02-25 VITALS — BP 118/68 | HR 65 | Ht 68.0 in | Wt 123.0 lb

## 2017-02-25 DIAGNOSIS — I700 Atherosclerosis of aorta: Secondary | ICD-10-CM | POA: Insufficient documentation

## 2017-02-25 DIAGNOSIS — I2510 Atherosclerotic heart disease of native coronary artery without angina pectoris: Secondary | ICD-10-CM

## 2017-02-25 DIAGNOSIS — J849 Interstitial pulmonary disease, unspecified: Secondary | ICD-10-CM

## 2017-02-25 DIAGNOSIS — R0609 Other forms of dyspnea: Secondary | ICD-10-CM

## 2017-02-25 DIAGNOSIS — R918 Other nonspecific abnormal finding of lung field: Secondary | ICD-10-CM | POA: Diagnosis not present

## 2017-02-25 DIAGNOSIS — J479 Bronchiectasis, uncomplicated: Secondary | ICD-10-CM

## 2017-02-25 NOTE — Patient Instructions (Addendum)
Chest Xray today - I will call you with results  6 minute walk test to measure your oxygen levels with exertion  More liberal use of albuterol rescue inhaler when you feel short of breath or when you can anticipate that you will do something that is going to cause shortness of breath

## 2017-02-28 NOTE — Progress Notes (Signed)
PULMONARY OFFICE FOLLOW UP NOTE  PROBLEMS: History of prior ARDS 1999 Prior long term nitrofurantoin therapy Pulmonary fibrosis Bronchiectasis Mild COPD  PT PROFILE: 83 y.o. F with remote minimal smoking history (15 PY, quit 1982) initially evaluated by Dr Stevenson Clinch 03/2016 and subsequently by Dr Chase Caller for pulmonary fibrosis. Pt has prior history of severe ARDS (1999) and has been on chronic nitrofurantoin for recurrent UTIs. She has been tried on 2 maintenance inhalers without discernible benefit.   DATA: CT chest 03/22/16: Generalized pulmonary hyperinflation but without bullous emphysema. Widespread bronchiectasis with bronchial wall thickening consistent with inflammatory bronchitis. Areas of pulmonary fibrosis, most pronounced in the lower lobes, where the bronchiectasis is more extensive, including areas of pulmonary lung destruction HRCT chest 06/11/16: Diffuse cylindrical and varicoid bronchiectasis throughout both lungs, most severe at the lung bases. Basilar predominant fibrotic interstitial lung disease with patchy honeycombing, with mild progression in the short interval since 03/22/2016 Spirometry 06/18/16: Mild obstruction, FEV1 1.42 liters (63%), FVC 2.43 (80%) Bronchoscopy 07/05/16: Normal airway exam. BAL negative for AFB Echocardiogram 10/07/15: LVEF 60%, Grade I DD, mild MR, RVSP estimate 35 mmHg PFT 02/10/17: no obstruction, normal TLC, moderate reduction in DLCO  INTERVAL HISTORY: No major events. Saw Dr Chase Caller 11/08/16 with no changes made to management.   SUBJ: This is a routine evaluation. Overall has class II SOB but she also reports brief episodes "at unusual times" meaning not with exertion. She is not using albuterol due to lack of perceived benefit. She requests handicap placard. She has had recent PFTs, documented above  OBJ: Vitals:   02/25/17 1017  BP: 118/68  Pulse: 65  SpO2: 99%  Weight: 123 lb (55.8 kg)  Height: 5\' 8"  (1.727 m)  RA  Gen:  NAD HEENT: All WNL Neck: NO LAN, no JVD noted Lungs: slightly decreased BS, bibasilar crackles, no wheezes, normal percussion Cardiovascular: Reg rate, normal rhythm, no M noted Abdomen: Soft, NT +BS Ext: no C/C/E Neuro: CNs intact, motor/sens grossly intact Skin: No lesions noted   DATA: CXR (02/25/17): [place] bibasilar interstitial fibrotic changes  IMPRESSION: 1) Pulmonary fibrosis, nonspecific - doubt IPF 2) Bronchiectasis  3) COPD, mild  Pulmonary fibrosis and bronchiectasis are most likely sequelae from severe ARDS in 1999 and possibly long term nitrofurantoin therapy.   PLAN: CXR ordered this encounter and reviewed above. We do not need to repeat CT chest unless there is change in her symptoms or CXR appearance.   6 MWT to assess O2 needs  I encouraged more liberal use of albuterol until she is convinced one way or another whether it is beneficial  Follow up in 4 months or PRN   [name], MD PCCM service Mobile [phone] Pager [phone] 02/28/2017

## 2017-03-11 ENCOUNTER — Ambulatory Visit (INDEPENDENT_AMBULATORY_CARE_PROVIDER_SITE_OTHER): Payer: Medicare Other | Admitting: *Deleted

## 2017-03-11 DIAGNOSIS — R0600 Dyspnea, unspecified: Secondary | ICD-10-CM

## 2017-03-11 NOTE — Progress Notes (Signed)
SMW performed today.  SIX MIN WALK 03/11/2017 08/03/2016  Medications no meds taken -  Supplimental Oxygen during Test? (L/min) No No  Laps 7 -  Partial Lap (in Meters) 9 -  Baseline BP (sitting) 110/60 -  Baseline Heartrate 83 -  Baseline Dyspnea (Borg Scale) 2 -  Baseline Fatigue (Borg Scale) 2 -  Baseline SPO2 96 -  BP (sitting) 124/70 -  Heartrate 105 -  Dyspnea (Borg Scale) 3 -  Fatigue (Borg Scale) 3 -  SPO2 99 -  BP (sitting) 112/58 -  Heartrate 89 -  SPO2 97 -  Stopped or Paused before Six Minutes No -  Distance Completed 345 -  Tech Comments: Pt walked at moderate pace w/ocomplications. pt walked a moderate pace, tolerated walk well.

## 2017-03-29 ENCOUNTER — Ambulatory Visit (INDEPENDENT_AMBULATORY_CARE_PROVIDER_SITE_OTHER)
Admission: RE | Admit: 2017-03-29 | Discharge: 2017-03-29 | Disposition: A | Discharge status: Home / Self Care | Payer: Medicare Other | Source: Ambulatory Visit | Attending: Family Medicine | Admitting: Family Medicine

## 2017-03-29 ENCOUNTER — Encounter: Payer: Self-pay | Admitting: Family Medicine

## 2017-03-29 ENCOUNTER — Ambulatory Visit (INDEPENDENT_AMBULATORY_CARE_PROVIDER_SITE_OTHER): Payer: Medicare Other | Admitting: Family Medicine

## 2017-03-29 DIAGNOSIS — I2510 Atherosclerotic heart disease of native coronary artery without angina pectoris: Secondary | ICD-10-CM | POA: Diagnosis not present

## 2017-03-29 DIAGNOSIS — M79642 Pain in left hand: Secondary | ICD-10-CM

## 2017-03-29 DIAGNOSIS — M19041 Primary osteoarthritis, right hand: Secondary | ICD-10-CM | POA: Diagnosis not present

## 2017-03-29 NOTE — Progress Notes (Signed)
Subjective:   Patient ID: Crystal Gregory, female    DOB: 04/23/1932, 83 y.o.   MRN: [id number]  Crystal Gregory is a pleasant 83 y.o. year old female who presents to clinic today with check Hands  on 03/29/2017  HPI:  Left hand pain- fingers acutely started hurting a few days ago.  No known injury.  So painful in middle finger that she had a hard time sleeping. Ibuprofen did help.  Does not looks swollen now but they did feel swollen.  No decreased grip strength.  No erythema or warmth. No new rxs.   Current Outpatient Prescriptions on File Prior to Visit  Medication Sig Dispense Refill  . AMBULATORY NON FORMULARY MEDICATION Medication Name: Incentive Spirometry  Use 10-15 times daily 1 each 0  . Calcium Carbonate-Vit D-Min (CALCIUM 1200 PO) Take by mouth.    . denosumab (PROLIA) 60 MG/ML SOLN injection Inject 60 mg into the skin every 6 (six) months. Administer in upper arm, thigh, or abdomen    . pantoprazole (PROTONIX) 20 MG tablet Take 1 tablet (20 mg total) by mouth daily. 90 tablet 1  . propranolol (INDERAL) 10 MG tablet TAKE 1 TABLET TWICE A DAY 180 tablet 1  . traZODone (DESYREL) 50 MG tablet TAKE 1 TABLET AT BEDTIME 90 tablet 1   No current facility-administered medications on file prior to visit.     No Known Allergies  Past Medical History:  Diagnosis Date  . Allergy   . GERD (gastroesophageal reflux disease)   . Septicemia ([place]) 1999   spent 4 weeks in ICU, intubated -? PNA    Past Surgical History:  Procedure Laterality Date  . ABDOMINAL HYSTERECTOMY    . FLEXIBLE BRONCHOSCOPY N/A 07/05/2016   Procedure: FLEXIBLE BRONCHOSCOPY;  Surgeon: Vilinda Boehringer, MD;  Location: [facility] ORS;  Service: Cardiopulmonary;  Laterality: N/A;    Family History  Problem Relation Age of Onset  . Parkinson's disease Mother   . Colon polyps Mother 36          . Breast cancer Sister     Social History   Social History  . Marital status: Married    Spouse name: N/A  . Number of  children: 2  . Years of education: N/A   Occupational History  . Retired     Social History Main Topics  . Smoking status: Former Smoker    Packs/day: 0.50    Years: 30.00    Quit date: 01/16/1981  . Smokeless tobacco: Never Used  . Alcohol use Yes     Comment: 2 drinks daily   . Drug use: No  . Sexual activity: Not on file   Other Topics Concern  . Not on file   Social History Narrative   Married.     2 children.  Lives with her husband in [place].   The PMH, PSH, Social History, Family History, Medications, and allergies have been reviewed in [facility], and have been updated if relevant.   Review of Systems  Constitutional: Negative.   Endocrine: Negative.   Genitourinary: Negative.   Musculoskeletal: Positive for arthralgias.  Allergic/Immunologic: Negative.   Neurological: Negative.   Hematological: Negative.   Psychiatric/Behavioral: Negative.   All other systems reviewed and are negative.      Objective:    BP 108/64 (BP Location: Right Arm, Patient Position: Sitting, Cuff Size: Normal)   Pulse 67   Temp 97.6 F (36.4 C) (Oral)   Wt 122 lb 8 oz (55.6 kg)  SpO2 98%   BMI 18.63 kg/m    Physical Exam  Constitutional: She appears well-developed and well-nourished. No distress.  HENT:  Head: Normocephalic and atraumatic.  Eyes: Conjunctivae are normal.  Cardiovascular: Normal rate.   Pulmonary/Chest: Effort normal.  Musculoskeletal:       Left hand: Normal. She exhibits normal range of motion, no tenderness, no bony tenderness, normal two-point discrimination, no deformity, no laceration and no swelling. Normal sensation noted. Decreased sensation is not present in the ulnar distribution, is not present in the medial redistribution and is not present in the radial distribution. Normal strength noted. She exhibits no finger abduction, no thumb/finger opposition and no wrist extension trouble.  Skin: Skin is warm and dry. She is not diaphoretic.  Psychiatric:  She has a normal mood and affect. Her behavior is normal. Judgment and thought content normal.  Nursing note reviewed.         Assessment & Plan:   Left hand pain - Plan: DG Hand Complete Left No Follow-up on file.

## 2017-03-29 NOTE — Patient Instructions (Signed)
Great to see you.  I will call you with your xray results. 

## 2017-03-29 NOTE — Assessment & Plan Note (Signed)
New- exam unremarkable and reassuring. Unlikely gout. Xray today for further evaluation. ?OA The patient indicates understanding of these issues and agrees with the plan.

## 2017-04-25 ENCOUNTER — Ambulatory Visit (INDEPENDENT_AMBULATORY_CARE_PROVIDER_SITE_OTHER): Payer: Medicare Other | Admitting: Family Medicine

## 2017-04-25 VITALS — BP 100/56 | HR 73 | Ht 68.0 in | Wt 123.0 lb

## 2017-04-25 DIAGNOSIS — M791 Myalgia, unspecified site: Secondary | ICD-10-CM

## 2017-04-25 DIAGNOSIS — M2550 Pain in unspecified joint: Secondary | ICD-10-CM

## 2017-04-25 DIAGNOSIS — R700 Elevated erythrocyte sedimentation rate: Secondary | ICD-10-CM

## 2017-04-25 DIAGNOSIS — I2510 Atherosclerotic heart disease of native coronary artery without angina pectoris: Secondary | ICD-10-CM

## 2017-04-25 LAB — COMPREHENSIVE METABOLIC PANEL
ALBUMIN: 4.1 g/dL (ref 3.5–5.2)
ALK PHOS: 49 U/L (ref 39–117)
ALT: 10 U/L (ref 0–35)
AST: 18 U/L (ref 0–37)
BILIRUBIN TOTAL: 0.6 mg/dL (ref 0.2–1.2)
BUN: 15 mg/dL (ref 6–23)
CO2: 28 mEq/L (ref 19–32)
Calcium: 9.9 mg/dL (ref 8.4–10.5)
Chloride: 100 mEq/L (ref 96–112)
Creatinine, Ser: 0.83 mg/dL (ref 0.40–1.20)
GFR: 69.72 mL/min (ref >60.00)
GLUCOSE: 98 mg/dL (ref 70–99)
Potassium: 4.6 mEq/L (ref 3.5–5.1)
Sodium: 133 mEq/L — ABNORMAL LOW (ref 135–145)
TOTAL PROTEIN: 7.8 g/dL (ref 6.0–8.3)

## 2017-04-25 LAB — C-REACTIVE PROTEIN: CRP: 0.6 mg/dL (ref 0.5–20.0)

## 2017-04-25 LAB — SEDIMENTATION RATE: Sed Rate: 127 mm/hr — ABNORMAL HIGH (ref 0–30)

## 2017-04-25 MED ORDER — PREDNISONE 20 MG PO TABS: 40.0000 mg | ORAL_TABLET | Freq: Every day | ORAL | 5 refills | Status: AC

## 2017-04-25 NOTE — Assessment & Plan Note (Signed)
?  PMR. Will check labs today- ESR, CRP, CMET. Start course of prednisone to decrease acute inflammation and to hopefully manage pain. Advised NOT to take NSAIDs with prednisone. The patient indicates understanding of these issues and agrees with the plan.

## 2017-04-25 NOTE — Progress Notes (Signed)
Subjective:   Patient ID: Crystal Gregory, female    DOB: 1932-08-19, 83 y.o.   MRN: [id number]  Crystal Gregory is a pleasant 83 y.o. year old female who presents to clinic today with Hand Pain ([place] )  on 04/25/2017  HPI:  Saw her on 03/29/17 for left hand pain. At that time, pain was localized to her 2nd and 3rd digit of her left hand.  Pain was not that severe.  Ibuprofen did help.  She did notice swelling.   Xray showed diffuse DJD but otherwise unremarkable.  Dg Hand Complete Left  Result Date: 03/29/2017 CLINICAL DATA:  Pain.  No injury. EXAM: LEFT HAND - COMPLETE 3+ VIEW COMPARISON:  No prior . FINDINGS: No acute bony or joint abnormality identified. No evidence of fracture or dislocation. Deformity noted of the base of the left fifth metacarpal, most likely from old injury. IMPRESSION: Diffuse degenerative change. Deformity of the base of the left fifth metacarpal most likely from old injury.No acute abnormality. Electronically Signed   By: Marcello Moores  Register   On: 03/29/2017 12:06   Since that time, left hand has become more swollen- had to remove her wedding ring for the first time in her life and now her arms and legs are hurting as well. Pain is now waking her up at night and Ibuprofen is not helping.  She feels very stiff every morning.  No temporal headaches or visual changes.  Current Outpatient Prescriptions on File Prior to Visit  Medication Sig Dispense Refill  . AMBULATORY NON FORMULARY MEDICATION Medication Name: Incentive Spirometry  Use 10-15 times daily 1 each 0  . Calcium Carbonate-Vit D-Min (CALCIUM 1200 PO) Take by mouth.    . denosumab (PROLIA) 60 MG/ML SOLN injection Inject 60 mg into the skin every 6 (six) months. Administer in upper arm, thigh, or abdomen    . pantoprazole (PROTONIX) 20 MG tablet Take 1 tablet (20 mg total) by mouth daily. 90 tablet 1  . propranolol (INDERAL) 10 MG tablet TAKE 1 TABLET TWICE A DAY 180 tablet 1  .  traZODone (DESYREL) 50 MG tablet TAKE 1 TABLET AT BEDTIME 90 tablet 1   No current facility-administered medications on file prior to visit.     No Known Allergies  Past Medical History:  Diagnosis Date  . Allergy   . GERD (gastroesophageal reflux disease)   . Septicemia ([place]) 1999   spent 4 weeks in ICU, intubated -? PNA    Past Surgical History:  Procedure Laterality Date  . ABDOMINAL HYSTERECTOMY    . FLEXIBLE BRONCHOSCOPY N/A 07/05/2016   Procedure: FLEXIBLE BRONCHOSCOPY;  Surgeon: Vilinda Boehringer, MD;  Location: [facility] ORS;  Service: Cardiopulmonary;  Laterality: N/A;    Family History  Problem Relation Age of Onset  . Parkinson's disease Mother   . Colon polyps Mother 77          . Breast cancer Sister     Social History   Social History  . Marital status: Married    Spouse name: N/A  . Number of children: 2  . Years of education: N/A   Occupational History  . Retired     Social History Main Topics  . Smoking status: Former Smoker    Packs/day: 0.50    Years: 30.00    Quit date: 01/16/1981  . Smokeless tobacco: Never Used  . Alcohol use Yes     Comment: 2 drinks daily   . Drug use: No  . Sexual  activity: Not on file   Other Topics Concern  . Not on file   Social History Narrative   Married.     2 children.  Lives with her husband in [place].   The PMH, PSH, Social History, Family History, Medications, and allergies have been reviewed in [facility], and have been updated if relevant.   Review of Systems  Constitutional: Negative.   HENT: Negative.   Respiratory: Negative.   Cardiovascular: Negative.   Gastrointestinal: Negative.   Endocrine: Negative.   Genitourinary: Negative.   Musculoskeletal: Positive for arthralgias, joint swelling, myalgias and neck stiffness. Negative for back pain and gait problem.  Allergic/Immunologic: Negative.   Neurological: Negative.   Hematological: Negative.   Psychiatric/Behavioral: Negative.   All other systems  reviewed and are negative.      Objective:    BP (!) 100/56   Pulse 73   Ht 5\' 8"  (1.727 m)   Wt 123 lb (55.8 kg)   SpO2 98%   BMI 18.70 kg/m    Physical Exam  Constitutional: She is oriented to person, place, and time. She appears well-developed and well-nourished. No distress.  HENT:  Head: Normocephalic and atraumatic.  Eyes: Conjunctivae are normal.  Cardiovascular: Normal rate.   Pulmonary/Chest: Effort normal.  Musculoskeletal:       Arms: Neurological: She is alert and oriented to person, place, and time. No cranial nerve deficit.  Skin: Skin is warm and dry. She is not diaphoretic.  Psychiatric: She has a normal mood and affect. Her behavior is normal. Judgment and thought content normal.  Nursing note and vitals reviewed.         Assessment & Plan:   Polyarthralgia - Plan: Sedimentation Rate, C-reactive protein, Comprehensive metabolic panel, CANCELED: Sedimentation Rate  Myalgia - Plan: Sedimentation Rate, C-reactive protein, Comprehensive metabolic panel, CANCELED: Sedimentation Rate No Follow-up on file.

## 2017-04-25 NOTE — Addendum Note (Signed)
Addended by: Lucille Passy on: 04/25/2017 03:41 PM   Modules accepted: Orders

## 2017-04-25 NOTE — Patient Instructions (Addendum)
Good to see you.  We are starting prednisone and checking labs today to rule out polymyalgia rheumatica (PMR).  We are starting prednisone- 40 mg daily with breakfast for 1 week.  Please do not take any alleve during the period but okay to take Tylenol.

## 2017-04-26 ENCOUNTER — Telehealth: Payer: Self-pay | Admitting: Family Medicine

## 2017-04-26 NOTE — Telephone Encounter (Signed)
Pt called - states that she needs to get a prolia shot.  She states that Dr Deborra Medina has already ordered a calcium lab.  cb number is [phone] Thanks

## 2017-04-26 NOTE — Telephone Encounter (Signed)
Spoke to pt and informed her she is not due for an additional prolia injection until 07/2017

## 2017-05-02 DIAGNOSIS — M2550 Pain in unspecified joint: Secondary | ICD-10-CM | POA: Diagnosis not present

## 2017-05-02 DIAGNOSIS — R700 Elevated erythrocyte sedimentation rate: Secondary | ICD-10-CM | POA: Diagnosis not present

## 2017-05-02 DIAGNOSIS — J849 Interstitial pulmonary disease, unspecified: Secondary | ICD-10-CM | POA: Diagnosis not present

## 2017-05-02 DIAGNOSIS — J84112 Idiopathic pulmonary fibrosis: Secondary | ICD-10-CM | POA: Diagnosis not present

## 2017-05-16 DIAGNOSIS — J849 Interstitial pulmonary disease, unspecified: Secondary | ICD-10-CM | POA: Diagnosis not present

## 2017-05-16 DIAGNOSIS — M059 Rheumatoid arthritis with rheumatoid factor, unspecified: Secondary | ICD-10-CM | POA: Diagnosis not present

## 2017-05-16 DIAGNOSIS — Z79899 Other long term (current) drug therapy: Secondary | ICD-10-CM | POA: Diagnosis not present

## 2017-05-16 DIAGNOSIS — R700 Elevated erythrocyte sedimentation rate: Secondary | ICD-10-CM | POA: Diagnosis not present

## 2017-05-16 DIAGNOSIS — J84112 Idiopathic pulmonary fibrosis: Secondary | ICD-10-CM | POA: Diagnosis not present

## 2017-06-03 DIAGNOSIS — M059 Rheumatoid arthritis with rheumatoid factor, unspecified: Secondary | ICD-10-CM | POA: Diagnosis not present

## 2017-06-03 DIAGNOSIS — M069 Rheumatoid arthritis, unspecified: Secondary | ICD-10-CM | POA: Diagnosis not present

## 2017-06-03 DIAGNOSIS — J849 Interstitial pulmonary disease, unspecified: Secondary | ICD-10-CM | POA: Diagnosis not present

## 2017-06-03 DIAGNOSIS — Z79899 Other long term (current) drug therapy: Secondary | ICD-10-CM | POA: Diagnosis not present

## 2017-06-03 DIAGNOSIS — J84112 Idiopathic pulmonary fibrosis: Secondary | ICD-10-CM | POA: Diagnosis not present

## 2017-06-09 ENCOUNTER — Encounter: Payer: Self-pay | Admitting: Pulmonary Disease

## 2017-06-09 ENCOUNTER — Ambulatory Visit (INDEPENDENT_AMBULATORY_CARE_PROVIDER_SITE_OTHER): Payer: Medicare Other | Admitting: Pulmonary Disease

## 2017-06-09 VITALS — BP 108/58 | HR 76 | Ht 68.0 in | Wt 123.0 lb

## 2017-06-09 DIAGNOSIS — J8410 Pulmonary fibrosis, unspecified: Secondary | ICD-10-CM | POA: Diagnosis not present

## 2017-06-09 DIAGNOSIS — M0510 Rheumatoid lung disease with rheumatoid arthritis of unspecified site: Secondary | ICD-10-CM | POA: Diagnosis not present

## 2017-06-09 DIAGNOSIS — I2510 Atherosclerotic heart disease of native coronary artery without angina pectoris: Secondary | ICD-10-CM | POA: Diagnosis not present

## 2017-06-09 NOTE — Patient Instructions (Signed)
Follow-up in 4-5 months with lung function tests and chest x-ray prior to that visit

## 2017-06-10 NOTE — Progress Notes (Signed)
PULMONARY OFFICE FOLLOW UP NOTE  PROBLEMS: History of prior ARDS 1999 Prior long term nitrofurantoin therapy Pulmonary fibrosis Bronchiectasis Mild COPD  PT PROFILE: 83 y.o. F with remote minimal smoking history (15 PY, quit 1982) initially evaluated by Dr Stevenson Clinch 03/2016 and subsequently by Dr Chase Caller for pulmonary fibrosis. Pt has prior history of severe ARDS (1999) and has been on chronic nitrofurantoin for recurrent UTIs. She has been tried on 2 maintenance inhalers without discernible benefit.   DATA: CT chest 03/22/16: Generalized pulmonary hyperinflation but without bullous emphysema. Widespread bronchiectasis with bronchial wall thickening consistent with inflammatory bronchitis. Areas of pulmonary fibrosis, most pronounced in the lower lobes, where the bronchiectasis is more extensive, including areas of pulmonary lung destruction HRCT chest 06/11/16: Diffuse cylindrical and varicoid bronchiectasis throughout both lungs, most severe at the lung bases. Basilar predominant fibrotic interstitial lung disease with patchy honeycombing, with mild progression in the short interval since 03/22/2016 Spirometry 06/18/16: Mild obstruction, FEV1 1.42 liters (63%), FVC 2.43 (80%) Bronchoscopy 07/05/16: Normal airway exam. BAL negative for AFB Echocardiogram 10/07/15: LVEF 60%, Grade I DD, mild MR, RVSP estimate 35 mmHg PFT 02/10/17: no obstruction, normal TLC, moderate reduction in DLCO 6MWT 03/11/17: 345 m. No desaturation  INTERVAL: Since last visit on 02/25/17, she has developed polyarthritis and has been diagnosed with rheumatoid arthritis. Presently being evaluated and managed at [place]: This is a routine evaluation. As above, she has a recent diagnosis of rheumatoid arthritis. Overall her respiratory symptoms are unchanged and she continues to have class II SOB. She denies significant cough or sputum production. She denies chest pain, hemoptysis, purulent sputum, lower extremity  edema, calf tenderness.   OBJ: Vitals:   06/09/17 0955 06/09/17 0956  BP:  (!) 108/58  Pulse:  76  SpO2:  98%  Weight: 55.8 kg (123 lb)   Height: 5\' 8"  (1.727 m)   RA  Gen: NAD HEENT: All WNL Neck: NO LAN, no JVD noted Lungs: Bibasilar L > R crackles, no wheezes, normal percussion Cardiovascular: Reg, no M noted Abdomen: Soft, NT +BS Ext: no C/C/E Neuro: CNs intact, motor/sens grossly intact Skin: No lesions noted   DATA: CXR: NNF  IMPRESSION: 1) Rheumatoid lung disease/pulmonary fibrosis with mild bronchiectasis - appears to be minimally progressive 2) history of ARDS 3) prior history of nitrofurantoin therapy   PLAN: No well-proven specific therapies exist for rheumatoid lung disease. She will be monitored expectantly.  We discussed the need for annual flu vaccination  We need to be vigilant about avoiding potential pulmonary toxins such as nitrofurantoin and amiodarone  We should follow her on a regular basis with chest x-rays and PFTs to monitor disease progression  Follow up in 4-5 months with chest x-ray and PFTs prior to that visit   [name], MD PCCM service Mobile [phone] Pager [phone] 06/10/2017

## 2017-06-14 ENCOUNTER — Encounter: Payer: Self-pay | Admitting: Family Medicine

## 2017-06-20 ENCOUNTER — Encounter: Payer: Self-pay | Admitting: Family Medicine

## 2017-06-22 ENCOUNTER — Telehealth: Payer: Self-pay | Admitting: *Deleted

## 2017-06-22 NOTE — Telephone Encounter (Signed)
Information has been submitted to pts insurance for verification of benefits. Awaiting response for coverage  

## 2017-06-23 DIAGNOSIS — J849 Interstitial pulmonary disease, unspecified: Secondary | ICD-10-CM | POA: Diagnosis not present

## 2017-06-23 DIAGNOSIS — M19049 Primary osteoarthritis, unspecified hand: Secondary | ICD-10-CM | POA: Diagnosis not present

## 2017-06-23 DIAGNOSIS — M059 Rheumatoid arthritis with rheumatoid factor, unspecified: Secondary | ICD-10-CM | POA: Diagnosis not present

## 2017-06-23 DIAGNOSIS — R278 Other lack of coordination: Secondary | ICD-10-CM | POA: Diagnosis not present

## 2017-06-23 DIAGNOSIS — R700 Elevated erythrocyte sedimentation rate: Secondary | ICD-10-CM | POA: Diagnosis not present

## 2017-06-27 DIAGNOSIS — Z79899 Other long term (current) drug therapy: Secondary | ICD-10-CM | POA: Diagnosis not present

## 2017-06-27 DIAGNOSIS — M069 Rheumatoid arthritis, unspecified: Secondary | ICD-10-CM | POA: Diagnosis not present

## 2017-06-27 LAB — HM DIABETES EYE EXAM

## 2017-07-04 ENCOUNTER — Encounter: Payer: Self-pay | Admitting: Family Medicine

## 2017-07-04 ENCOUNTER — Other Ambulatory Visit: Payer: Self-pay

## 2017-07-04 MED ORDER — PROPRANOLOL HCL 10 MG PO TABS: ORAL_TABLET | ORAL | 0 refills | Status: DC

## 2017-07-04 MED ORDER — PANTOPRAZOLE SODIUM 20 MG PO TBEC: 20.0000 mg | DELAYED_RELEASE_TABLET | Freq: Every day | ORAL | 0 refills | Status: DC

## 2017-07-04 MED ORDER — TRAZODONE HCL 50 MG PO TABS: ORAL_TABLET | ORAL | 0 refills | Status: DC

## 2017-07-04 NOTE — Telephone Encounter (Signed)
Last refill 12/28/16 #90 Last OV 04/25/17 Ok to refill?

## 2017-07-05 ENCOUNTER — Ambulatory Visit: Payer: Medicare Other | Attending: Internal Medicine | Admitting: Occupational Therapy

## 2017-07-05 DIAGNOSIS — M25631 Stiffness of right wrist, not elsewhere classified: Secondary | ICD-10-CM | POA: Insufficient documentation

## 2017-07-05 DIAGNOSIS — M79642 Pain in left hand: Secondary | ICD-10-CM | POA: Insufficient documentation

## 2017-07-05 DIAGNOSIS — M25532 Pain in left wrist: Secondary | ICD-10-CM | POA: Diagnosis not present

## 2017-07-05 DIAGNOSIS — M79641 Pain in right hand: Secondary | ICD-10-CM | POA: Diagnosis not present

## 2017-07-05 DIAGNOSIS — M25531 Pain in right wrist: Secondary | ICD-10-CM

## 2017-07-05 DIAGNOSIS — M25642 Stiffness of left hand, not elsewhere classified: Secondary | ICD-10-CM | POA: Insufficient documentation

## 2017-07-05 DIAGNOSIS — M25632 Stiffness of left wrist, not elsewhere classified: Secondary | ICD-10-CM | POA: Diagnosis not present

## 2017-07-05 DIAGNOSIS — M6281 Muscle weakness (generalized): Secondary | ICD-10-CM | POA: Insufficient documentation

## 2017-07-05 NOTE — Therapy (Signed)
[facility] [address] [place], [address] Phone: [phone]   Fax:  [phone]  Occupational Therapy Evaluation  Patient Details  Name: Crystal Gregory MRN: [id number] Date of Birth: [birth year] Referring Provider: Meda Coffee  Encounter Date: 07/05/2017      OT End of Session - 07/05/17 1540    Visit Number 1   Number of Visits 4   Date for OT Re-Evaluation 08/02/17   OT Start Time 2774   OT Stop Time 1116   OT Time Calculation (min) 62 min   Activity Tolerance Patient tolerated treatment well   Behavior During Therapy [facility] for tasks assessed/performed      Past Medical History:  Diagnosis Date  . Allergy   . GERD (gastroesophageal reflux disease)   . Septicemia ([place]) 1999   spent 4 weeks in ICU, intubated -? PNA    Past Surgical History:  Procedure Laterality Date  . ABDOMINAL HYSTERECTOMY    . FLEXIBLE BRONCHOSCOPY N/A 07/05/2016   Procedure: FLEXIBLE BRONCHOSCOPY;  Surgeon: Vilinda Boehringer, MD;  Location: [facility] ORS;  Service: Cardiopulmonary;  Laterality: N/A;    There were no vitals filed for this visit.      Subjective Assessment - 07/05/17 1532    Subjective  I had pulmonary issues = and now RA - my hands change the last 2 months - it  do not look like my hands at all - R thumb and middle finger joint swollen,  L hand hurting most all the time - some days worse than others , and both my wrist hurts  - then my shoulders, knees -  never know what will hurts    Patient Stated Goals I do not want my hands to get worse , want to get more strength to play piano , write better , love cooking - to cut or lift things, open packages    Currently in Pain? Yes   Pain Score 2    Pain Location Wrist   Pain Orientation Right;Left   Pain Descriptors / Indicators Aching   Pain Type Chronic pain   Pain Onset More than a month ago           [facility] OT Assessment - 07/05/17 0001      Assessment   Diagnosis  bilateral hand pain , RA    Referring Provider Meda Coffee   Onset Date 05/04/17     Home  Environment   Lives With Spouse     Prior Function   Vocation Retired   Leisure Retired Therapist, sports , R hand dominant - loves to [employer] , play piano and organ, on computer , read book , play on tablet ,      AROM   Right Wrist Extension 45 Degrees  pain   Right Wrist Flexion 55 Degrees  pain   Right Wrist Radial Deviation 18 Degrees   Right Wrist Ulnar Deviation 30 Degrees  pain   Left Wrist Extension 55 Degrees  pain   Left Wrist Flexion 90 Degrees  pain   Left Wrist Radial Deviation 18 Degrees   Left Wrist Ulnar Deviation --  WNL     Strength   Right Hand Grip (lbs) 18   Right Hand Lateral Pinch 6 lbs   Right Hand 3 Point Pinch 5 lbs   Left Hand Grip (lbs) 26   Left Hand Lateral Pinch 8 lbs   Left Hand 3 Point Pinch 8 lbs     Right Hand  AROM   R Thumb Opposition to Index --  Opposition WNL  but pain at [facility]   R Index  MCP 0-90 85 Degrees   R Index PIP 0-100 95 Degrees     Left Hand AROM   L Index  MCP 0-90 85 Degrees   L Little PIP 0-100 95 Degrees       Paraffin L hand and wrist and fluido with AROM to digist and wrist to L hand -  done to R and L hand - felt better and showed decrease stiffness with both  But R hand had less pain doing ROM exercises     Heat to be done prior to HEP  tendon glides - AROM but not forcing - only ROM  Stop when feeling pull  Opposition  RD of digits   Joint protection and AE review with pt  Hand out on adaptation in kitchen   FItted with [facility] neoprene splint on R hand to use with pain full act of thumb at home                  OT Education - 07/05/17 1539    Education provided Yes   Education Details findings of eval , HEP and joint protection and AE    Person(s) Educated Patient   Methods Explanation;Demonstration;Tactile cues;Verbal cues;Handout   Comprehension Verbal cues required;Returned demonstration;Verbalized understanding           OT Short Term Goals - 07/05/17 1545      OT SHORT TERM GOAL #1   Title Pain on PRHWE improve with 10 poins    Baseline Pain on PRWHE at eval 28/50   Time 2   Period Weeks   Status New   Target Date 07/19/17     OT SHORT TERM GOAL #2   Title Pt to be ind in HEP for AROM and modalities to increase wrist AROM and maintain digits AROM    Baseline no knowledge of HEP, wrist decrease see flowsheet and digits WNL except 2nd digits    Time 3   Period Weeks   Status New   Target Date 07/26/17           OT Long Term Goals - 07/05/17 1548      OT LONG TERM GOAL #1   Title Function on PRWHE improve with 10 points    Baseline Function score at eval on PRWHE  16.5/50   Time 4   Period Weeks   Status New   Target Date 08/02/17     OT LONG TERM GOAL #2   Title Pt to verbalize 3 joint protection and AE to decrease pain and increase performance and using hands in ADL's and IADL's    Baseline no knowledge - diagnosis 2 months ago   Time 4   Period Weeks   Status New   Target Date 08/02/17               Plan - 07/05/17 1540    Clinical Impression Statement Pt refer to OT with diagnosis of RA the last 2 months- increase pain and stiffness in wrist and  hands - with R hand worse than L hand the last 2 months- decrease grip and prehension in bilateral hands - ulnar drifts of digits at [facility] - limiting her functional use of hands in ADL's and IADl's - always aware of pain and at worse 8/10 - pt can benefit from OT services ,    Occupational performance deficits (  Please refer to evaluation for details): IADL's;ADL's;Rest and Sleep;Leisure;Social Participation;Play   Rehab Potential Fair   OT Frequency 1x / week   OT Duration 4 weeks   OT Treatment/Interventions Self-care/ADL training;Moist Heat;Fluidtherapy;Patient/family education;Splinting;Therapeutic exercises;Contrast Bath;Therapeutic exercise;Ultrasound;Parrafin;Manual Therapy;Passive range of motion   Plan asses  progress with home program    Clinical Decision Making Several treatment options, min-mod task modification necessary   OT Home Exercise Plan see pt instructions    Consulted and Agree with Plan of Care Patient      Patient will benefit from skilled therapeutic intervention in order to improve the following deficits and impairments:  Decreased range of motion, Impaired flexibility, Decreased coordination, Decreased knowledge of use of DME, Impaired UE functional use, Pain, Decreased strength  Visit Diagnosis: Pain in left hand - Plan: Ot plan of care cert/re-cert  Pain in right hand - Plan: Ot plan of care cert/re-cert  Pain in left wrist - Plan: Ot plan of care cert/re-cert  Pain in right wrist - Plan: Ot plan of care cert/re-cert  Stiffness of left hand, not elsewhere classified - Plan: Ot plan of care cert/re-cert  Stiffness of left wrist, not elsewhere classified - Plan: Ot plan of care cert/re-cert  Stiffness of right wrist, not elsewhere classified - Plan: Ot plan of care cert/re-cert  Muscle weakness (generalized) - Plan: Ot plan of care cert/re-cert      G-Codes - 97/67/34 1552    Functional Assessment Tool Used (Outpatient only) PRWHE , ROM , grip and prehension - clinical judgement    Functional Limitation Self care   Self Care Current Status (L9379) At least 40 percent but less than 60 percent impaired, limited or restricted   Self Care Goal Status (K2409) At least 1 percent but less than 20 percent impaired, limited or restricted      Problem List Patient Active Problem List   Diagnosis Date Noted  . Polyarthralgia 04/25/2017  . Myalgia 04/25/2017  . Left hand pain 03/29/2017  . ILD (interstitial lung disease) ([place]) 06/18/2016  . IPF (idiopathic pulmonary fibrosis) ([place]) 03/23/2016  . Bronchiectasis without complication ([place]) 73/53/2992  . Allergic rhinitis 03/22/2016  . Hoarseness 03/22/2016  . Anal fissure 08/18/2015  . Insomnia 08/18/2015  .  Osteoporosis 02/13/2015  . Stress due to illness of family member 09/16/2014  . Hiatal hernia 10/13/2012  . GERD (gastroesophageal reflux disease)   . Allergy     Rosalyn Gess OTR/L,CLT  07/05/2017, 3:57 PM  [facility] [address] [place], [address] Phone: [phone]   Fax:  [phone]  Name: Rebeccah Ivins MRN: [id number] Date of Birth: [birth year]

## 2017-07-05 NOTE — Patient Instructions (Signed)
Heat   tendon glides - AROM but not forcing - only ROM  Stop when feeling pull  Opposition  RD of digits   Joint protection and AE review with pt  Hand out on adaptation in kitchen

## 2017-07-12 ENCOUNTER — Ambulatory Visit: Payer: Medicare Other | Admitting: Occupational Therapy

## 2017-07-12 DIAGNOSIS — M79642 Pain in left hand: Secondary | ICD-10-CM

## 2017-07-12 DIAGNOSIS — M25532 Pain in left wrist: Secondary | ICD-10-CM

## 2017-07-12 DIAGNOSIS — M25531 Pain in right wrist: Secondary | ICD-10-CM | POA: Diagnosis not present

## 2017-07-12 DIAGNOSIS — M25642 Stiffness of left hand, not elsewhere classified: Secondary | ICD-10-CM

## 2017-07-12 DIAGNOSIS — M25632 Stiffness of left wrist, not elsewhere classified: Secondary | ICD-10-CM | POA: Diagnosis not present

## 2017-07-12 DIAGNOSIS — M6281 Muscle weakness (generalized): Secondary | ICD-10-CM

## 2017-07-12 DIAGNOSIS — M79641 Pain in right hand: Secondary | ICD-10-CM

## 2017-07-12 DIAGNOSIS — M25631 Stiffness of right wrist, not elsewhere classified: Secondary | ICD-10-CM

## 2017-07-12 NOTE — Patient Instructions (Signed)
Review with pt her HEP for ROM  And joint protection and AE review and reinforce to use - to decrease pain

## 2017-07-12 NOTE — Therapy (Signed)
[facility] [address] [place], [address] Phone: [phone]   Fax:  [phone]  Occupational Therapy Treatment  Patient Details  Name: Crystal Gregory MRN: [id number] Date of Birth: 31-Aug-1932 Referring Provider: Meda Coffee  Encounter Date: 07/12/2017      OT End of Session - 07/12/17 1042    Visit Number 2   Number of Visits 4   Date for OT Re-Evaluation 08/02/17   OT Start Time 0916   OT Stop Time 1015   OT Time Calculation (min) 59 min   Activity Tolerance Patient tolerated treatment well   Behavior During Therapy [facility] for tasks assessed/performed      Past Medical History:  Diagnosis Date  . Allergy   . GERD (gastroesophageal reflux disease)   . Septicemia ([place]) 1999   spent 4 weeks in ICU, intubated -? PNA    Past Surgical History:  Procedure Laterality Date  . ABDOMINAL HYSTERECTOMY    . FLEXIBLE BRONCHOSCOPY N/A 07/05/2016   Procedure: FLEXIBLE BRONCHOSCOPY;  Surgeon: Vilinda Boehringer, MD;  Location: [facility] ORS;  Service: Cardiopulmonary;  Laterality: N/A;    There were no vitals filed for this visit.      Subjective Assessment - 07/12/17 0947    Subjective  My hands feel about the same - I was hurting so much over weekend - and my L shoulder hurting for the last 36 hrs - phone Dr Meda Coffee - she put me on predisone again -  I want you to go over my exercises again please -    Patient Stated Goals I do not want my hands to get worse , want to get more strength to play piano , write better , love cooking - to cut or lift things, open packages    Currently in Pain? Yes   Pain Score 5    Pain Location Wrist   Pain Orientation Right;Left   Pain Descriptors / Indicators Aching   Pain Type Chronic pain   Pain Onset More than a month ago            [facility] OT Assessment - 07/12/17 0001      AROM   Right Wrist Extension 68 Degrees   Right Wrist Flexion 80 Degrees   Left Wrist Extension 55 Degrees    Left Wrist Flexion 90 Degrees     Strength   Right Hand Grip (lbs) 18   Left Hand Grip (lbs) 15      Measurements taken - pt cont to have pain in hands and wrist - and this date her L shoulder more than 8/10              OT Treatments/Exercises (OP) - 07/12/17 0001      RUE Fluidotherapy   Number Minutes Fluidotherapy (P)  10 Minutes   RUE Fluidotherapy Location (P)  Hand;Wrist     LUE Fluidotherapy   Number Minutes Fluidotherapy (P)  10 Minutes   LUE Fluidotherapy Location (P)  Hand;Wrist    pain decrease per pt  Review with pt tendon glides - AROM   not to force and stop when pain  Opposition to all digits - oval - and can slide down 5th with thumb if pain free  RD of digits - can put paper underneath to slide on table  Add wrist AROM for RD, UD , flexion and extnetion - 1-2 x day after heat or in shower  And add pendulum for shoulders to keep  mobility - even on bad days - pt demo understanding   Reinforce use of joint protection and AE -to decrease pressure on joints and decrease pain  She has great motion but pain stays increase - pt did not implement since last week joint protection and AE -             OT Education - 07/12/17 1042    Education provided Yes   Education Details HEP and reinforce joint protection and AE use to take stress off joints    Person(s) Educated Patient   Methods Explanation;Demonstration;Verbal cues;Tactile cues   Comprehension Verbalized understanding;Verbal cues required;Returned demonstration          OT Short Term Goals - 07/05/17 1545      OT SHORT TERM GOAL #1   Title Pain on PRHWE improve with 10 poins    Baseline Pain on PRWHE at eval 28/50   Time 2   Period Weeks   Status New   Target Date 07/19/17     OT SHORT TERM GOAL #2   Title Pt to be ind in HEP for AROM and modalities to increase wrist AROM and maintain digits AROM    Baseline no knowledge of HEP, wrist decrease see flowsheet and digits WNL except  2nd digits    Time 3   Period Weeks   Status New   Target Date 07/26/17           OT Long Term Goals - 07/05/17 1548      OT LONG TERM GOAL #1   Title Function on PRWHE improve with 10 points    Baseline Function score at eval on PRWHE  16.5/50   Time 4   Period Weeks   Status New   Target Date 08/02/17     OT LONG TERM GOAL #2   Title Pt to verbalize 3 joint protection and AE to decrease pain and increase performance and using hands in ADL's and IADL's    Baseline no knowledge - diagnosis 2 months ago   Time 4   Period Weeks   Status New   Target Date 08/02/17               Plan - 07/12/17 1043    Clinical Impression Statement Pt show great progress in R wrist AROM , bilateral hands and L wrist AROM WFL - but pain and strength still issues - pt still gripping ,holding object and over do act - reinforce joint protection and use of AE - that it is okay to do and use tthose to take stress of joints - add L shoulder and bilateral wrist HEP    Occupational performance deficits (Please refer to evaluation for details): IADL's;Rest and Sleep;Leisure;Play;Social Participation   Rehab Potential Fair   OT Frequency 1x / week   OT Duration 4 weeks   OT Treatment/Interventions Self-care/ADL training;Moist Heat;Fluidtherapy;Patient/family education;Splinting;Therapeutic exercises;Contrast Bath;Therapeutic exercise;Ultrasound;Parrafin;Manual Therapy;Passive range of motion   Plan assess if used AE and joint protection    Clinical Decision Making Several treatment options, min-mod task modification necessary   OT Home Exercise Plan see pt instructions    Consulted and Agree with Plan of Care Patient      Patient will benefit from skilled therapeutic intervention in order to improve the following deficits and impairments:  Decreased range of motion, Impaired flexibility, Decreased coordination, Decreased knowledge of use of DME, Impaired UE functional use, Pain, Decreased  strength  Visit Diagnosis: Pain in right hand  Pain in  left wrist  Pain in right wrist  Stiffness of left hand, not elsewhere classified  Stiffness of left wrist, not elsewhere classified  Stiffness of right wrist, not elsewhere classified  Muscle weakness (generalized)  Pain in left hand    Problem List Patient Active Problem List   Diagnosis Date Noted  . Polyarthralgia 04/25/2017  . Myalgia 04/25/2017  . Left hand pain 03/29/2017  . ILD (interstitial lung disease) ([place]) 06/18/2016  . IPF (idiopathic pulmonary fibrosis) ([place]) 03/23/2016  . Bronchiectasis without complication ([place]) 67/67/2094  . Allergic rhinitis 03/22/2016  . Hoarseness 03/22/2016  . Anal fissure 08/18/2015  . Insomnia 08/18/2015  . Osteoporosis 02/13/2015  . Stress due to illness of family member 09/16/2014  . Hiatal hernia 10/13/2012  . GERD (gastroesophageal reflux disease)   . Allergy     Rosalyn Gess OTR/L,CLT 07/12/2017, 1:46 PM  [facility] [address] [place], [address] Phone: [phone]   Fax:  [phone]  Name: Kielyn Kardell MRN: [id number] Date of Birth: Feb 06, 1932

## 2017-07-13 ENCOUNTER — Encounter: Payer: Self-pay | Admitting: Family Medicine

## 2017-07-13 ENCOUNTER — Ambulatory Visit (INDEPENDENT_AMBULATORY_CARE_PROVIDER_SITE_OTHER): Payer: Medicare Other | Admitting: Family Medicine

## 2017-07-13 VITALS — BP 100/58 | HR 62 | Temp 97.7°F | Ht 68.0 in | Wt 121.0 lb

## 2017-07-13 DIAGNOSIS — G4700 Insomnia, unspecified: Secondary | ICD-10-CM

## 2017-07-13 DIAGNOSIS — M069 Rheumatoid arthritis, unspecified: Secondary | ICD-10-CM | POA: Insufficient documentation

## 2017-07-13 DIAGNOSIS — Z23 Encounter for immunization: Secondary | ICD-10-CM | POA: Diagnosis not present

## 2017-07-13 DIAGNOSIS — I2510 Atherosclerotic heart disease of native coronary artery without angina pectoris: Secondary | ICD-10-CM

## 2017-07-13 DIAGNOSIS — K219 Gastro-esophageal reflux disease without esophagitis: Secondary | ICD-10-CM

## 2017-07-13 MED ORDER — PANTOPRAZOLE SODIUM 20 MG PO TBEC: 20.0000 mg | DELAYED_RELEASE_TABLET | Freq: Every day | ORAL | 3 refills | Status: DC

## 2017-07-13 MED ORDER — TRAZODONE HCL 50 MG PO TABS: ORAL_TABLET | ORAL | 3 refills | Status: DC

## 2017-07-13 MED ORDER — PROPRANOLOL HCL 10 MG PO TABS: ORAL_TABLET | ORAL | 3 refills | Status: DC

## 2017-07-13 NOTE — Assessment & Plan Note (Signed)
Continue current PPI. eRx refill sent.

## 2017-07-13 NOTE — Assessment & Plan Note (Signed)
Well controlled on current dose of trazodone. eRx refills sent.

## 2017-07-13 NOTE — Patient Instructions (Signed)
Great to see you.  Please talk to [name] about transfer of care.

## 2017-07-13 NOTE — Progress Notes (Signed)
Subjective:   Patient ID: Crystal Gregory, female    DOB: May 05, 1932, 83 y.o.   MRN: [id number]  Crystal Gregory is a pleasant 83 y.o. year old female who presents to clinic today with Follow-up (Patient is here today for medication follow-up.  She will need 90d of Trazodone, Propranolol, and Pantoprazole to [facility].)  on 07/13/2017  HPI:  Insomnia-  Feels Trazodone is working well at current dose.  On and off prednisone now for her RA. Plaquenil unfortunately has not worked as well as she had hoped.  Still seeing Dr. Leonidas Romberg for her pulmonary fibrosis.  Has actually been breathing better of late.   GERD- Feels symptoms controlled with protonix 20 mg daily.  Current Outpatient Prescriptions on File Prior to Visit  Medication Sig Dispense Refill  . Calcium Carbonate-Vit D-Min (CALCIUM 1200 PO) Take by mouth.    . denosumab (PROLIA) 60 MG/ML SOLN injection Inject 60 mg into the skin every 6 (six) months. Administer in upper arm, thigh, or abdomen    . hydroxychloroquine (PLAQUENIL) 200 MG tablet Take 2 tablets by mouth daily.    . predniSONE (DELTASONE) 10 MG tablet Take 1.5 tablets by mouth daily.     No current facility-administered medications on file prior to visit.     No Known Allergies  Past Medical History:  Diagnosis Date  . Allergy   . GERD (gastroesophageal reflux disease)   . Septicemia ([place]) 1999   spent 4 weeks in ICU, intubated -? PNA    Past Surgical History:  Procedure Laterality Date  . ABDOMINAL HYSTERECTOMY    . FLEXIBLE BRONCHOSCOPY N/A 07/05/2016   Procedure: FLEXIBLE BRONCHOSCOPY;  Surgeon: Vilinda Boehringer, MD;  Location: [facility] ORS;  Service: Cardiopulmonary;  Laterality: N/A;    Family History  Problem Relation Age of Onset  . Parkinson's disease Mother   . Colon polyps Mother 8          . Breast cancer Sister     Social History   Social History  . Marital status: Married    Spouse name: N/A  . Number of children: 2  . Years of  education: N/A   Occupational History  . Retired     Social History Main Topics  . Smoking status: Former Smoker    Packs/day: 0.50    Years: 30.00    Quit date: 01/16/1981  . Smokeless tobacco: Never Used  . Alcohol use Yes     Comment: 2 drinks daily   . Drug use: No  . Sexual activity: Not on file   Other Topics Concern  . Not on file   Social History Narrative   Married.     2 children.  Lives with her husband in [place].   The PMH, PSH, Social History, Family History, Medications, and allergies have been reviewed in [facility], and have been updated if relevant.   Review of Systems  Constitutional: Negative.   Respiratory: Negative.   Cardiovascular: Negative.   Genitourinary: Negative.   Musculoskeletal: Negative.   Neurological: Negative.   Hematological: Negative.   Psychiatric/Behavioral: Negative.   All other systems reviewed and are negative.      Objective:    BP (!) 100/58 (BP Location: Right Arm, Patient Position: Sitting, Cuff Size: Normal)   Pulse 62   Temp 97.7 F (36.5 C) (Oral)   Ht 5\' 8"  (1.727 m)   Wt 121 lb (54.9 kg)   SpO2 97%   BMI 18.40 kg/m    Physical Exam  General:  Well-developed,well-nourished,in no acute distress; alert,appropriate and cooperative throughout examination Head:  normocephalic and atraumatic.   Eyes:  vision grossly intact, PERRL Ears:  R ear normal and L ear normal externally, TMs clear bilaterally Nose:  no external deformity.   Mouth:  good dentition.   Neck:  No deformities, masses, or tenderness noted. Lungs:  Normal respiratory effort, chest expands symmetrically. Lungs are clear to auscultation, no crackles or wheezes. Heart:  Normal rate and regular rhythm. S1 and S2 normal without gallop, murmur, click, rub or other extra sounds. Abdomen:  Bowel sounds positive,abdomen soft and non-tender without masses, organomegaly or hernias noted. Msk:  No deformity or scoliosis noted of thoracic or lumbar spine.     Extremities:  No clubbing, cyanosis, edema, or deformity noted with normal full range of motion of all joints.   Neurologic:  alert & oriented X3 and gait normal.   Skin:  Intact without suspicious lesions or rashes Psych:  Cognition and judgment appear intact. Alert and cooperative with normal attention span and concentration. No apparent delusions, illusions, hallucinations      Assessment & Plan:   Insomnia, unspecified type  Gastroesophageal reflux disease, esophagitis presence not specified  Need for influenza vaccination - Plan: Flu Vaccine QUAD 6+ mos PF IM (Fluarix Quad PF) No Follow-up on file.

## 2017-07-19 ENCOUNTER — Ambulatory Visit: Payer: Medicare Other | Admitting: Occupational Therapy

## 2017-07-27 ENCOUNTER — Ambulatory Visit: Payer: Medicare Other | Admitting: Occupational Therapy

## 2017-07-27 DIAGNOSIS — M79641 Pain in right hand: Secondary | ICD-10-CM | POA: Diagnosis not present

## 2017-07-27 DIAGNOSIS — M79642 Pain in left hand: Secondary | ICD-10-CM | POA: Diagnosis not present

## 2017-07-27 DIAGNOSIS — M25631 Stiffness of right wrist, not elsewhere classified: Secondary | ICD-10-CM

## 2017-07-27 DIAGNOSIS — M25532 Pain in left wrist: Secondary | ICD-10-CM | POA: Diagnosis not present

## 2017-07-27 DIAGNOSIS — M25642 Stiffness of left hand, not elsewhere classified: Secondary | ICD-10-CM | POA: Diagnosis not present

## 2017-07-27 DIAGNOSIS — M25531 Pain in right wrist: Secondary | ICD-10-CM

## 2017-07-27 DIAGNOSIS — M6281 Muscle weakness (generalized): Secondary | ICD-10-CM

## 2017-07-27 DIAGNOSIS — M25632 Stiffness of left wrist, not elsewhere classified: Secondary | ICD-10-CM

## 2017-07-27 NOTE — Therapy (Signed)
[facility] [address] [place], [address] Phone: [phone]   Fax:  [phone]  Occupational Therapy Treatment  Patient Details  Name: Crystal Gregory MRN: [id number] Date of Birth: 11-13-1931 Referring Provider: Meda Coffee  Encounter Date: 07/27/2017      OT End of Session - 07/27/17 1205    Visit Number 3   Number of Visits 4   Date for OT Re-Evaluation 08/02/17   OT Start Time 1105   OT Stop Time 1151   OT Time Calculation (min) 46 min   Activity Tolerance Patient tolerated treatment well   Behavior During Therapy [facility] for tasks assessed/performed      Past Medical History:  Diagnosis Date  . Allergy   . GERD (gastroesophageal reflux disease)   . Septicemia ([place]) 1999   spent 4 weeks in ICU, intubated -? PNA    Past Surgical History:  Procedure Laterality Date  . ABDOMINAL HYSTERECTOMY    . FLEXIBLE BRONCHOSCOPY N/A 07/05/2016   Procedure: FLEXIBLE BRONCHOSCOPY;  Surgeon: Vilinda Boehringer, MD;  Location: [facility] ORS;  Service: Cardiopulmonary;  Laterality: N/A;    There were no vitals filed for this visit.      Subjective Assessment - 07/27/17 1202    Subjective  My pain is better - about 4/10 at the worse - but I still have my lung issue and running nose - don't know if it is my medication - still on the 2 and low predisone dose - I am trying to modify how I use my hands - and was on trip so did not cook as much or play organ - exercises is doing okay - I have appt  with Dr Meda Coffee 1st Nov    Patient Stated Goals I do not want my hands to get worse , want to get more strength to play piano , write better , love cooking - to cut or lift things, open packages    Currently in Pain? Yes   Pain Score 4    Pain Location Hand   Pain Orientation Right;Left   Pain Descriptors / Indicators Aching   Pain Type Chronic pain            [facility] OT Assessment - 07/27/17 0001      AROM   Right Wrist Extension  70 Degrees   Left Wrist Extension 65 Degrees     Strength   Right Hand Grip (lbs) 31   Right Hand Lateral Pinch 8 lbs   Right Hand 3 Point Pinch 10 lbs   Left Hand Grip (lbs) 35   Left Hand Lateral Pinch 11 lbs   Left Hand 3 Point Pinch 12 lbs     Right Hand AROM   R Index  MCP 0-90 90 Degrees   R Index PIP 0-100 95 Degrees     Left Hand AROM   L Index  MCP 0-90 90 Degrees      Assess progress in ROM at digist and wrist , grip and prehension   great progress see flowsheet  Review pt's HEP for tendon glides - and to modify to not increase pain at shoulders Wrist AROM , opposition and RD of digits   Discuss and answer patients question on joint protection , AE and modificaitions to tasks to decrease pain and increase ease of performance of tasks  Decrease pain at [facility] of 2nd R digit  OT Education - 07/27/17 1204    Education provided Yes   Education Details HEP and joint protection /AE for  next month    Person(s) Educated Patient   Methods Explanation;Demonstration;Tactile cues;Verbal cues   Comprehension Verbal cues required;Returned demonstration;Verbalized understanding          OT Short Term Goals - 07/27/17 1209      OT SHORT TERM GOAL #1   Title Pain on PRHWE improve with 10 poins    Baseline Pain on PRWHE at eval 28/50 - pain improving to 4/10 at the worse    Time 2   Period Weeks   Status On-going   Target Date 08/03/17     OT SHORT TERM GOAL #2   Title Pt to be ind in HEP for AROM and modalities to increase wrist AROM and maintain digits AROM    Status Achieved           OT Long Term Goals - 07/27/17 1209      OT LONG TERM GOAL #1   Title Function on PRWHE improve with 10 points    Baseline Function score at eval on PRWHE  16.5/50 - reassess next time    Time 4   Period Weeks   Status On-going   Target Date 08/24/17     OT LONG TERM GOAL #2   Title Pt to verbalize 3 joint protection and AE to decrease  pain and increase performance and using hands in ADL's and IADL's    Status Achieved               Plan - 07/27/17 1205    Clinical Impression Statement Pt show great progress in wrist AROM and bilateral  grip and prehension strength - pain improved - pt trying to use more modifications and joint protection - and is on meds for arthritis - pt to see Dr Meda Coffee  1st Nov and will  discuss progress but also worries about her lungs and runny nose - pt to phone me in month for reassessment  and possible discharge    Occupational performance deficits (Please refer to evaluation for details): ADL's;IADL's;Rest and Sleep;Play;Leisure;Social Participation   Rehab Potential Fair   OT Frequency Monthly   OT Duration 4 weeks   OT Treatment/Interventions Self-care/ADL training;Moist Heat;Fluidtherapy;Patient/family education;Splinting;Therapeutic exercises;Contrast Bath;Therapeutic exercise;Ultrasound;Parrafin;Manual Therapy;Passive range of motion   Plan pt to do homeprogram for month and phone for follow up    Clinical Decision Making Several treatment options, min-mod task modification necessary   OT Home Exercise Plan see pt instructions    Consulted and Agree with Plan of Care Patient      Patient will benefit from skilled therapeutic intervention in order to improve the following deficits and impairments:  Decreased range of motion, Impaired flexibility, Decreased coordination, Decreased knowledge of use of DME, Impaired UE functional use, Pain, Decreased strength  Visit Diagnosis: Pain in right hand  Pain in right wrist  Pain in left wrist  Stiffness of left hand, not elsewhere classified  Stiffness of left wrist, not elsewhere classified  Stiffness of right wrist, not elsewhere classified  Muscle weakness (generalized)  Pain in left hand    Problem List Patient Active Problem List   Diagnosis Date Noted  . RA (rheumatoid arthritis) ([place]) 07/13/2017  . Polyarthralgia  04/25/2017  . ILD (interstitial lung disease) ([place]) 06/18/2016  . IPF (idiopathic pulmonary fibrosis) ([place]) 03/23/2016  . Bronchiectasis without complication ([employer]) 97/67/3419  . Allergic rhinitis 03/22/2016  . Hoarseness 03/22/2016  .  Anal fissure 08/18/2015  . Insomnia 08/18/2015  . Osteoporosis 02/13/2015  . Stress due to illness of family member 09/16/2014  . Hiatal hernia 10/13/2012  . GERD (gastroesophageal reflux disease)   . Allergy     Rosalyn Gess OTR/L,CLT 07/27/2017, 12:10 PM  [facility] [address] [place], [address] Phone: [phone]   Fax:  [phone]  Name: Basia Mcginty MRN: [id number] Date of Birth: [birth year]

## 2017-07-27 NOTE — Patient Instructions (Signed)
Cont with same HEP for ROM  And joint protection and modifications

## 2017-07-31 ENCOUNTER — Encounter: Payer: Self-pay | Admitting: Family Medicine

## 2017-08-03 NOTE — Telephone Encounter (Signed)
Verification of benefits have been processed and an approval has been received for pts prolia injection. Pts estimated cost are appx $0. This is only an estimate and cannot be confirmed until benefits are paid. Please advise pt and schedule if needed. If scheduled, once the injection is received, pls contact me back with the date it was received so that I am able to update prolia folder. thanks   Lm on pts vm and mychart message sent

## 2017-08-04 DIAGNOSIS — M059 Rheumatoid arthritis with rheumatoid factor, unspecified: Secondary | ICD-10-CM | POA: Diagnosis not present

## 2017-08-04 DIAGNOSIS — J849 Interstitial pulmonary disease, unspecified: Secondary | ICD-10-CM | POA: Diagnosis not present

## 2017-08-04 DIAGNOSIS — Z79899 Other long term (current) drug therapy: Secondary | ICD-10-CM | POA: Diagnosis not present

## 2017-08-04 NOTE — Telephone Encounter (Signed)
Spoke to pt and advised. Ca lab and prolia injection scheduled.  

## 2017-08-10 ENCOUNTER — Other Ambulatory Visit (INDEPENDENT_AMBULATORY_CARE_PROVIDER_SITE_OTHER): Payer: Medicare Other

## 2017-08-10 DIAGNOSIS — M810 Age-related osteoporosis without current pathological fracture: Secondary | ICD-10-CM

## 2017-08-10 LAB — CALCIUM: Calcium: 9.3 mg/dL (ref 8.4–10.5)

## 2017-08-18 ENCOUNTER — Ambulatory Visit (INDEPENDENT_AMBULATORY_CARE_PROVIDER_SITE_OTHER): Payer: Medicare Other | Admitting: *Deleted

## 2017-08-18 DIAGNOSIS — M810 Age-related osteoporosis without current pathological fracture: Secondary | ICD-10-CM

## 2017-08-18 MED ORDER — DENOSUMAB 60 MG/ML ~~LOC~~ SOLN
60.0000 mg | Freq: Once | SUBCUTANEOUS | Status: AC
  Administered 2017-08-18: 60 mg via SUBCUTANEOUS

## 2017-08-19 ENCOUNTER — Telehealth: Payer: Self-pay | Admitting: Pulmonary Disease

## 2017-08-19 ENCOUNTER — Ambulatory Visit
Admission: RE | Admit: 2017-08-19 | Discharge: 2017-08-19 | Disposition: A | Discharge status: Home / Self Care | Payer: Medicare Other | Source: Ambulatory Visit | Attending: Pulmonary Disease | Admitting: Pulmonary Disease

## 2017-08-19 DIAGNOSIS — I700 Atherosclerosis of aorta: Secondary | ICD-10-CM | POA: Diagnosis not present

## 2017-08-19 DIAGNOSIS — J8410 Pulmonary fibrosis, unspecified: Secondary | ICD-10-CM | POA: Diagnosis not present

## 2017-08-19 DIAGNOSIS — J449 Chronic obstructive pulmonary disease, unspecified: Secondary | ICD-10-CM | POA: Diagnosis not present

## 2017-08-19 NOTE — Telephone Encounter (Signed)
Pt calling stating she is having some flare up on her lung disease  She is having a bad cough   Would like to know if we can help with this before thanksgiving   Please advise.

## 2017-08-19 NOTE — Telephone Encounter (Signed)
Patient scheduled for 11/19 and informed to have cxr done.Omega

## 2017-08-22 ENCOUNTER — Encounter: Payer: Self-pay | Admitting: Internal Medicine

## 2017-08-22 ENCOUNTER — Ambulatory Visit (INDEPENDENT_AMBULATORY_CARE_PROVIDER_SITE_OTHER): Payer: Medicare Other | Admitting: Internal Medicine

## 2017-08-22 VITALS — BP 118/66 | HR 113 | Resp 16 | Ht 68.0 in | Wt 121.0 lb

## 2017-08-22 DIAGNOSIS — I2510 Atherosclerotic heart disease of native coronary artery without angina pectoris: Secondary | ICD-10-CM | POA: Diagnosis not present

## 2017-08-22 DIAGNOSIS — J209 Acute bronchitis, unspecified: Secondary | ICD-10-CM | POA: Diagnosis not present

## 2017-08-22 DIAGNOSIS — J440 Chronic obstructive pulmonary disease with acute lower respiratory infection: Secondary | ICD-10-CM | POA: Diagnosis not present

## 2017-08-22 MED ORDER — BENZONATATE 200 MG PO CAPS: 200.0000 mg | ORAL_CAPSULE | Freq: Three times a day (TID) | ORAL | 0 refills | Status: DC | PRN

## 2017-08-22 NOTE — Patient Instructions (Signed)
--  Starting taking pepto bismol or similar for nausea.

## 2017-08-22 NOTE — Progress Notes (Signed)
PULMONARY OFFICE FOLLOW UP NOTE  PROBLEMS: History of prior ARDS 1999 Prior long term nitrofurantoin therapy Pulmonary fibrosis Bronchiectasis Mild COPD  PT PROFILE: 83 y.o. F with remote minimal smoking history (15 PY, quit 1982) initially evaluated by Dr Stevenson Clinch 03/2016 and subsequently by Dr Chase Caller for pulmonary fibrosis. Pt has prior history of severe ARDS (1999) and has been on chronic nitrofurantoin for recurrent UTIs. She has been tried on 2 maintenance inhalers without discernible benefit.   DATA: CT chest 03/22/16: Generalized pulmonary hyperinflation but without bullous emphysema. Widespread bronchiectasis with bronchial wall thickening consistent with inflammatory bronchitis. Areas of pulmonary fibrosis, most pronounced in the lower lobes, where the bronchiectasis is more extensive, including areas of pulmonary lung destruction HRCT chest 06/11/16: Diffuse cylindrical and varicoid bronchiectasis throughout both lungs, most severe at the lung bases. Basilar predominant fibrotic interstitial lung disease with patchy honeycombing, with mild progression in the short interval since 03/22/2016 Spirometry 06/18/16: Mild obstruction, FEV1 1.42 liters (63%), FVC 2.43 (80%) Bronchoscopy 07/05/16: Normal airway exam. BAL negative for AFB Echocardiogram 10/07/15: LVEF 60%, Grade I DD, mild MR, RVSP estimate 35 mmHg PFT 02/10/17: no obstruction, normal TLC, moderate reduction in DLCO 6MWT 03/11/17: 345 m. No desaturation  INTERVAL: Since last visit on 02/25/17, she has developed polyarthritis and has been diagnosed with rheumatoid arthritis. Presently being evaluated and managed at [place]: She usually here sees Dr. Alva Garnet, she is here for an acute visit because she has been having trouble with cough, nausea for the last 10 days ago, her plaquenil was also decreased just before that.  She is somewhat tangential in her history, therefore it is difficult to pin down exactly what her  complaint is today, but it appears that her main complaint is nausea and cough. She appears to be having nausea, cough, morning sputum production.  This appeared to be happen after her Plaquenil dose was decreased.  She is concerned that she has not been eating as much because of the nausea, she is having gas coming in for Thanksgiving.  She is not taking anything over-the-counter for the nausea as she was told by her physicians not to take things over-the-counter. Her PCP recently left, and she has nobody who can prescribe her nausea medications, therefore she came here.   Imaging personally reviewed from 08/19/17; The interstitial changes are little changed from previous.   OBJ: Vitals:   08/22/17 0855 08/22/17 0900  BP:  118/66  Pulse:  (!) 113  Resp: 16   SpO2:  98%  Weight: 121 lb (54.9 kg)   Height: 5\' 8"  (1.727 m)   RA  Gen: NAD HEENT: All WNL Neck: NO LAN, no JVD noted Lungs: Bibasilar L > R crackles, no wheezes, normal percussion Cardiovascular: Reg, no M noted Abdomen: Soft, NT +BS Ext: no C/C/E Neuro: CNs intact, motor/sens grossly intact Skin: No lesions noted   DATA: CXR: NNF  IMPRESSION: 1) Rheumatoid lung disease/pulmonary fibrosis with mild bronchiectasis - appears to be minimally progressive 2) history of ARDS 3) prior history of nitrofurantoin therapy - Nausea and cough with sputum production, we discussed this this could be an acute viral syndrome versus bacterial infection versus progression of her underlying rheumatological disease after he sent decrease of her medication.   PLAN: - I am ordering a sputum culture, she is asked to use an over-the-counter medications such as Pepto-Bismol as needed for the nausea. -I have also ordered Tessalon to help suppress her cough. - She is asked to follow-up  with Dr. Jamal Collin at her next regularly scheduled appointment.   Marda Stalker, MD.   Board Certified in Internal Medicine, Pulmonary Medicine, [place], and Sleep Medicine.  [facility] Office Number: [phone] Pager: [phone]  Patricia Pesa, M.D.  [name], M.D

## 2017-08-23 ENCOUNTER — Telehealth: Payer: Self-pay | Admitting: Internal Medicine

## 2017-08-23 ENCOUNTER — Emergency Department: Payer: Medicare Other

## 2017-08-23 ENCOUNTER — Other Ambulatory Visit: Payer: Self-pay

## 2017-08-23 ENCOUNTER — Inpatient Hospital Stay
Admission: EM | Admit: 2017-08-23 | Discharge: 2017-08-25 | DRG: 193 | Disposition: A | Discharge status: Home / Self Care | Payer: Medicare Other | Attending: Internal Medicine | Admitting: Internal Medicine

## 2017-08-23 ENCOUNTER — Encounter: Payer: Self-pay | Admitting: Emergency Medicine

## 2017-08-23 ENCOUNTER — Ambulatory Visit
Admission: RE | Admit: 2017-08-23 | Discharge: 2017-08-23 | Disposition: A | Discharge status: Home / Self Care | Payer: Medicare Other | Source: Ambulatory Visit | Attending: Internal Medicine | Admitting: Internal Medicine

## 2017-08-23 DIAGNOSIS — J189 Pneumonia, unspecified organism: Secondary | ICD-10-CM | POA: Diagnosis present

## 2017-08-23 DIAGNOSIS — Z87891 Personal history of nicotine dependence: Secondary | ICD-10-CM

## 2017-08-23 DIAGNOSIS — J440 Chronic obstructive pulmonary disease with acute lower respiratory infection: Secondary | ICD-10-CM | POA: Diagnosis present

## 2017-08-23 DIAGNOSIS — E43 Unspecified severe protein-calorie malnutrition: Secondary | ICD-10-CM | POA: Diagnosis present

## 2017-08-23 DIAGNOSIS — M069 Rheumatoid arthritis, unspecified: Secondary | ICD-10-CM | POA: Diagnosis present

## 2017-08-23 DIAGNOSIS — D899 Disorder involving the immune mechanism, unspecified: Secondary | ICD-10-CM | POA: Diagnosis present

## 2017-08-23 DIAGNOSIS — E876 Hypokalemia: Secondary | ICD-10-CM | POA: Diagnosis present

## 2017-08-23 DIAGNOSIS — R05 Cough: Secondary | ICD-10-CM | POA: Diagnosis not present

## 2017-08-23 DIAGNOSIS — Z681 Body mass index (BMI) 19 or less, adult: Secondary | ICD-10-CM

## 2017-08-23 DIAGNOSIS — E871 Hypo-osmolality and hyponatremia: Secondary | ICD-10-CM | POA: Diagnosis present

## 2017-08-23 DIAGNOSIS — K219 Gastro-esophageal reflux disease without esophagitis: Secondary | ICD-10-CM | POA: Diagnosis present

## 2017-08-23 DIAGNOSIS — E878 Other disorders of electrolyte and fluid balance, not elsewhere classified: Secondary | ICD-10-CM | POA: Diagnosis present

## 2017-08-23 DIAGNOSIS — J449 Chronic obstructive pulmonary disease, unspecified: Secondary | ICD-10-CM | POA: Diagnosis not present

## 2017-08-23 DIAGNOSIS — R110 Nausea: Secondary | ICD-10-CM

## 2017-08-23 DIAGNOSIS — J181 Lobar pneumonia, unspecified organism: Secondary | ICD-10-CM

## 2017-08-23 HISTORY — DX: Pneumonia, unspecified organism: J18.9

## 2017-08-23 LAB — URINALYSIS, COMPLETE (UACMP) WITH MICROSCOPIC
BACTERIA UA: NONE SEEN
Bilirubin Urine: NEGATIVE
Glucose, UA: NEGATIVE mg/dL
Hgb urine dipstick: NEGATIVE
Ketones, ur: NEGATIVE mg/dL
Leukocytes, UA: NEGATIVE
Nitrite: NEGATIVE
PH: 5 (ref 5.0–8.0)
Protein, ur: 100 mg/dL — AB
SPECIFIC GRAVITY, URINE: 1.021 (ref 1.005–1.030)

## 2017-08-23 LAB — EXPECTORATED SPUTUM ASSESSMENT W GRAM STAIN, RFLX TO RESP C

## 2017-08-23 LAB — TROPONIN I: TROPONIN I: 0.03 ng/mL — AB (ref <0.03)

## 2017-08-23 LAB — BASIC METABOLIC PANEL
Anion gap: 11 (ref 5–15)
BUN: 14 mg/dL (ref 6–20)
CHLORIDE: 97 mmol/L — AB (ref 101–111)
CO2: 20 mmol/L — AB (ref 22–32)
CREATININE: 0.68 mg/dL (ref 0.44–1.00)
Calcium: 7.9 mg/dL — ABNORMAL LOW (ref 8.9–10.3)
GFR calc Af Amer: >60 mL/min (ref >60)
GFR calc non Af Amer: >60 mL/min (ref >60)
Glucose, Bld: 129 mg/dL — ABNORMAL HIGH (ref 65–99)
POTASSIUM: 3.2 mmol/L — AB (ref 3.5–5.1)
SODIUM: 128 mmol/L — AB (ref 135–145)

## 2017-08-23 LAB — CBC
HEMATOCRIT: 30.9 % — AB (ref 35.0–47.0)
Hemoglobin: 10.7 g/dL — ABNORMAL LOW (ref 12.0–16.0)
MCH: 33.8 pg (ref 26.0–34.0)
MCHC: 34.7 g/dL (ref 32.0–36.0)
MCV: 97.6 fL (ref 80.0–100.0)
PLATELETS: 236 10*3/uL (ref 150–440)
RBC: 3.17 MIL/uL — ABNORMAL LOW (ref 3.80–5.20)
RDW: 13.5 % (ref 11.5–14.5)
WBC: 7.5 10*3/uL (ref 3.6–11.0)

## 2017-08-23 LAB — STREP PNEUMONIAE URINARY ANTIGEN: STREP PNEUMO URINARY ANTIGEN: NEGATIVE

## 2017-08-23 LAB — LACTIC ACID, PLASMA
Lactic Acid, Venous: 1.3 mmol/L (ref 0.5–1.9)
Lactic Acid, Venous: 1.2 mmol/L (ref 0.5–1.9)

## 2017-08-23 LAB — EXPECTORATED SPUTUM ASSESSMENT W REFEX TO RESP CULTURE

## 2017-08-23 MED ORDER — SODIUM CHLORIDE 0.9 % IV BOLUS (SEPSIS)
1000.0000 mL | Freq: Once | INTRAVENOUS | Status: AC
  Administered 2017-08-23: 1000 mL via INTRAVENOUS

## 2017-08-23 MED ORDER — AZITHROMYCIN 500 MG IV SOLR
500.0000 mg | Freq: Once | INTRAVENOUS | Status: AC
  Administered 2017-08-23: 500 mg via INTRAVENOUS
  Filled 2017-08-23: qty 500

## 2017-08-23 MED ORDER — PANTOPRAZOLE SODIUM 40 MG IV SOLR
40.0000 mg | INTRAVENOUS | Status: DC
  Administered 2017-08-23 – 2017-08-24 (×2): 40 mg via INTRAVENOUS
  Filled 2017-08-23 (×2): qty 40

## 2017-08-23 MED ORDER — MORPHINE SULFATE (PF) 2 MG/ML IV SOLN
1.0000 mg | INTRAVENOUS | Status: DC | PRN
  Administered 2017-08-23: 2 mg via INTRAVENOUS
  Filled 2017-08-23: qty 1

## 2017-08-23 MED ORDER — SODIUM CHLORIDE 0.9 % IV BOLUS (SEPSIS)
250.0000 mL | Freq: Once | INTRAVENOUS | Status: AC
Start: 2017-08-23 — End: 2017-08-23
  Administered 2017-08-23: 250 mL via INTRAVENOUS

## 2017-08-23 MED ORDER — IPRATROPIUM-ALBUTEROL 0.5-2.5 (3) MG/3ML IN SOLN: 3.0000 mL | RESPIRATORY_TRACT | Status: DC | PRN

## 2017-08-23 MED ORDER — PROPRANOLOL HCL 10 MG PO TABS
10.0000 mg | ORAL_TABLET | Freq: Two times a day (BID) | ORAL | Status: DC
  Administered 2017-08-24 – 2017-08-25 (×3): 10 mg via ORAL
  Filled 2017-08-23 (×4): qty 1

## 2017-08-23 MED ORDER — CLONAZEPAM 0.5 MG PO TBDP: 0.5000 mg | ORAL_TABLET | Freq: Two times a day (BID) | ORAL | Status: DC | PRN

## 2017-08-23 MED ORDER — HYDROXYCHLOROQUINE SULFATE 200 MG PO TABS
200.0000 mg | ORAL_TABLET | Freq: Every day | ORAL | Status: DC
  Administered 2017-08-24 – 2017-08-25 (×2): 200 mg via ORAL
  Filled 2017-08-23 (×2): qty 1

## 2017-08-23 MED ORDER — ONDANSETRON HCL 4 MG/2ML IJ SOLN
4.0000 mg | Freq: Four times a day (QID) | INTRAMUSCULAR | Status: DC | PRN
  Administered 2017-08-23 – 2017-08-24 (×2): 4 mg via INTRAVENOUS
  Filled 2017-08-23 (×2): qty 2

## 2017-08-23 MED ORDER — BENZONATATE 100 MG PO CAPS: 200.0000 mg | ORAL_CAPSULE | Freq: Three times a day (TID) | ORAL | Status: DC | PRN

## 2017-08-23 MED ORDER — DEXTROSE 5 % IV SOLN: 500.0000 mg | INTRAVENOUS | Status: DC

## 2017-08-23 MED ORDER — METHYLPREDNISOLONE SODIUM SUCC 40 MG IJ SOLR
40.0000 mg | Freq: Four times a day (QID) | INTRAMUSCULAR | Status: DC
  Administered 2017-08-23 – 2017-08-25 (×6): 40 mg via INTRAVENOUS
  Filled 2017-08-23 (×6): qty 1

## 2017-08-23 MED ORDER — GUAIFENESIN ER 600 MG PO TB12
600.0000 mg | ORAL_TABLET | Freq: Two times a day (BID) | ORAL | Status: DC
  Administered 2017-08-24 – 2017-08-25 (×3): 600 mg via ORAL
  Filled 2017-08-23 (×4): qty 1

## 2017-08-23 MED ORDER — CEFTRIAXONE SODIUM 1 G IJ SOLR
1.0000 g | INTRAMUSCULAR | Status: DC
  Administered 2017-08-24: 1 g via INTRAVENOUS
  Filled 2017-08-23 (×3): qty 10

## 2017-08-23 MED ORDER — DEXTROSE 5 % IV SOLN
500.0000 mg | INTRAVENOUS | Status: DC
  Administered 2017-08-24: 18:00:00 500 mg via INTRAVENOUS
  Filled 2017-08-23 (×3): qty 500

## 2017-08-23 MED ORDER — ENOXAPARIN SODIUM 40 MG/0.4ML ~~LOC~~ SOLN
40.0000 mg | SUBCUTANEOUS | Status: DC
  Administered 2017-08-23 – 2017-08-24 (×2): 40 mg via SUBCUTANEOUS
  Filled 2017-08-23 (×2): qty 0.4

## 2017-08-23 MED ORDER — POTASSIUM CHLORIDE 20 MEQ PO PACK
40.0000 meq | PACK | Freq: Once | ORAL | Status: DC
  Filled 2017-08-23: qty 2

## 2017-08-23 MED ORDER — CEFTRIAXONE SODIUM IN DEXTROSE 20 MG/ML IV SOLN
1.0000 g | INTRAVENOUS | Status: DC
  Filled 2017-08-23: qty 50

## 2017-08-23 MED ORDER — BUDESONIDE 0.5 MG/2ML IN SUSP
0.5000 mg | Freq: Two times a day (BID) | RESPIRATORY_TRACT | Status: DC
  Administered 2017-08-24 – 2017-08-25 (×3): 0.5 mg via RESPIRATORY_TRACT
  Filled 2017-08-23 (×3): qty 2

## 2017-08-23 MED ORDER — TRAZODONE HCL 50 MG PO TABS
50.0000 mg | ORAL_TABLET | Freq: Every day | ORAL | Status: DC
  Administered 2017-08-24: 21:00:00 50 mg via ORAL
  Filled 2017-08-23 (×2): qty 1

## 2017-08-23 MED ORDER — CEFTRIAXONE SODIUM IN DEXTROSE 20 MG/ML IV SOLN
1.0000 g | Freq: Once | INTRAVENOUS | Status: AC
  Administered 2017-08-23: 1 g via INTRAVENOUS
  Filled 2017-08-23: qty 50

## 2017-08-23 MED ORDER — SODIUM CHLORIDE 0.9 % IV BOLUS (SEPSIS)
500.0000 mL | Freq: Once | INTRAVENOUS | Status: AC
Start: 2017-08-23 — End: 2017-08-23
  Administered 2017-08-23: 500 mL via INTRAVENOUS

## 2017-08-23 MED ORDER — POTASSIUM CHLORIDE IN NACL 20-0.9 MEQ/L-% IV SOLN
INTRAVENOUS | Status: DC
  Administered 2017-08-23 – 2017-08-25 (×4): via INTRAVENOUS
  Filled 2017-08-23 (×6): qty 1000

## 2017-08-23 MED ORDER — LEFLUNOMIDE 20 MG PO TABS
20.0000 mg | ORAL_TABLET | Freq: Every day | ORAL | Status: DC
  Administered 2017-08-25: 10:00:00 20 mg via ORAL
  Filled 2017-08-23 (×3): qty 1

## 2017-08-23 NOTE — Telephone Encounter (Signed)
If she has already tried taking some pepto for nausea and it is still present, she should contact her PCP for further advice.

## 2017-08-23 NOTE — Telephone Encounter (Signed)
Pt calling stating she was seen yesterday by Dr Ashby Dawes She states the instructions he gave her is not working well for her  She has had very bad nausea along with a high fever She was told that it was more like COPD  (acute bronchitis)  Would like some new advise

## 2017-08-23 NOTE — Telephone Encounter (Signed)
Informed pt of response and she states her PCP left and the NP won't prescribe her anything until she has been seen. Pt then stated she had not voided in 24 hours so I informed pt she needs to go to ER or an urgent care if she won't call her PCP. Pt verbalized understanding. Nothing further needed.

## 2017-08-23 NOTE — ED Triage Notes (Signed)
Patient presents to ED via POV from home with c/o nausea, cough and lack of appetite x 1 week. Patient reports fevers at home, 101.8 last tylenol was taken at 1300.

## 2017-08-23 NOTE — Progress Notes (Signed)
ANTIBIOTIC CONSULT NOTE - INITIAL  Pharmacy Consult for azithromycin and ceftriaxone Indication: pneumonia  No Known Allergies  Patient Measurements: Height: 5\' 8"  (172.7 cm) Weight: 120 lb (54.4 kg) IBW/kg (Calculated) : 63.9   Vital Signs: Temp: 97.5 F (36.4 C) (11/20 1621) Temp Source: Oral (11/20 1621) BP: 122/53 (11/20 1930) Pulse Rate: 103 (11/20 1930) Intake/Output from previous day: No intake/output data recorded. Intake/Output from this shift: No intake/output data recorded.  Labs: Recent Labs    08/23/17 1625  WBC 7.5  HGB 10.7*  PLT 236  CREATININE 0.68   Estimated Creatinine Clearance: 45.8 mL/min (by C-G formula based on SCr of 0.68 mg/dL).  Assessment: Pharmacy consulted to dose ceftriaxone and azithromycin in this 83 year old female for PNA.  Plan:  Ceftriaxone 1 g IV daily and azithromycin 500 mg IV daily  Crystal Gregory 08/23/2017,8:11 PM

## 2017-08-23 NOTE — Telephone Encounter (Signed)
Please advise on message below.

## 2017-08-23 NOTE — ED Notes (Signed)
Kate RN, aware of bed assigned  

## 2017-08-23 NOTE — ED Provider Notes (Signed)
[facility] Emergency Department Provider Note    None    (approximate)  I have reviewed the triage vital signs and the nursing notes.   HISTORY  Chief Complaint Nausea and Cough    HPI Crystal Gregory is a 83 y.o. female history of rheumatoid arthritis on Plaquenil presents with chief complaint of nausea times week, productive cough, left rib cage pain and decreased oral intake.  No measured fevers.  Denies any abdominal pain but does endorse nausea and is not been able to eat any.  No recent antibiotics.  No recent admission.  Pain is worse with taking a deep breath.  Does have history of pulmonary fibrosis and is not on any home oxygen.  Denies any lower extremity edema.  Past Medical History:  Diagnosis Date  . Allergy   . GERD (gastroesophageal reflux disease)   . Septicemia ([place]) 1999   spent 4 weeks in ICU, intubated -? PNA   Family History  Problem Relation Age of Onset  . Parkinson's disease Mother   . Colon polyps Mother 51          . Breast cancer Sister    Past Surgical History:  Procedure Laterality Date  . ABDOMINAL HYSTERECTOMY    . FLEXIBLE BRONCHOSCOPY N/A 07/05/2016   Procedure: FLEXIBLE BRONCHOSCOPY;  Surgeon: Vilinda Boehringer, MD;  Location: [facility] ORS;  Service: Cardiopulmonary;  Laterality: N/A;   Patient Active Problem List   Diagnosis Date Noted  . RA (rheumatoid arthritis) ([place]) 07/13/2017  . Polyarthralgia 04/25/2017  . ILD (interstitial lung disease) ([place]) 06/18/2016  . IPF (idiopathic pulmonary fibrosis) ([place]) 03/23/2016  . Bronchiectasis without complication ([place]) 65/78/4696  . Allergic rhinitis 03/22/2016  . Hoarseness 03/22/2016  . Anal fissure 08/18/2015  . Insomnia 08/18/2015  . Osteoporosis 02/13/2015  . Stress due to illness of family member 09/16/2014  . Hiatal hernia 10/13/2012  . GERD (gastroesophageal reflux disease)   . Allergy       Prior to Admission medications   Medication Sig Start Date End  Date Taking? Authorizing Provider  benzonatate (TESSALON) 200 MG capsule Take 1 capsule (200 mg total) 3 (three) times daily as needed by mouth for cough. 08/22/17   Laverle Hobby, MD  Calcium Carbonate-Vit D-Min (CALCIUM 1200 PO) Take by mouth.    [provider]  denosumab (PROLIA) 60 MG/ML SOLN injection Inject 60 mg into the skin every 6 (six) months. Administer in upper arm, thigh, or abdomen    [provider]  hydroxychloroquine (PLAQUENIL) 200 MG tablet Take 200 mg daily by mouth.  05/16/17   [provider]  leflunomide (ARAVA) 20 MG tablet Take 20 mg by mouth daily. 06/15/17   [provider]  pantoprazole (PROTONIX) 20 MG tablet Take 1 tablet (20 mg total) by mouth daily. 07/13/17   Lucille Passy, MD  predniSONE (DELTASONE) 10 MG tablet Take 1.5 tablets by mouth daily. 06/03/17   [provider]  propranolol (INDERAL) 10 MG tablet TAKE 1 TABLET TWICE A DAY 07/13/17   Lucille Passy, MD  traZODone (DESYREL) 50 MG tablet TAKE 1 TABLET AT BEDTIME 07/13/17   Lucille Passy, MD    Allergies Patient has no known allergies.    Social History Social History   Tobacco Use  . Smoking status: Former Smoker    Packs/day: 0.50    Years: 30.00    Pack years: 15.00    Last attempt to quit: 01/16/1981    Years since quitting: 36.6  .  Smokeless tobacco: Never Used  Substance Use Topics  . Alcohol use: Yes    Comment: 2 drinks daily   . Drug use: No    Review of Systems Patient denies headaches, rhinorrhea, blurry vision, numbness, shortness of breath, chest pain, edema, cough, abdominal pain, nausea, vomiting, diarrhea, dysuria, fevers, rashes or hallucinations unless otherwise stated above in HPI. ____________________________________________   PHYSICAL EXAM:  VITAL SIGNS: Vitals:   08/23/17 1621 08/23/17 1920  BP: (!) 100/51 130/61  Pulse: (!) 118 100  Resp:  16  Temp: (!) 97.5 F (36.4 C)   SpO2: 94% 98%    Constitutional:  Alert and oriented. Chronically ill appearing and in no acute distress. Eyes: Conjunctivae are normal.  Head: Atraumatic. Nose: No congestion/rhinnorhea. Mouth/Throat: Mucous membranes are moist.   Neck: No stridor. Painless ROM.  Cardiovascular: tachycardic rate, regular rhythm. Grossly normal heart sounds.  Good peripheral circulation. Respiratory: Mild tachypnea with respiratory crackles in posterior bases with coarse breath sounds most notably in the left lung fields.   Gastrointestinal: Soft and nontender. No distention. No abdominal bruits. No CVA tenderness. Genitourinary:  Musculoskeletal: No lower extremity tenderness nor edema.  No joint effusions. Neurologic:  Normal speech and language. No gross focal neurologic deficits are appreciated. No facial droop Skin:  Skin is warm, dry and intact. No rash noted. Psychiatric: Mood and affect are normal. Speech and behavior are normal.  ____________________________________________   LABS (all labs ordered are listed, but only abnormal results are displayed)  Results for orders placed or performed during the hospital encounter of 08/23/17 (from the past 24 hour(s))  Basic metabolic panel     Status: Abnormal   Collection Time: 08/23/17  4:25 PM  Result Value Ref Range   Sodium 128 (L) 135 - 145 mmol/L   Potassium 3.2 (L) 3.5 - 5.1 mmol/L   Chloride 97 (L) 101 - 111 mmol/L   CO2 20 (L) 22 - 32 mmol/L   Glucose, Bld 129 (H) 65 - 99 mg/dL   BUN 14 6 - 20 mg/dL   Creatinine, Ser 0.68 0.44 - 1.00 mg/dL   Calcium 7.9 (L) 8.9 - 10.3 mg/dL   GFR calc non Af Amer >60 >60 mL/min   GFR calc Af Amer >60 >60 mL/min   Anion gap 11 5 - 15  CBC     Status: Abnormal   Collection Time: 08/23/17  4:25 PM  Result Value Ref Range   WBC 7.5 3.6 - 11.0 K/uL   RBC 3.17 (L) 3.80 - 5.20 MIL/uL   Hemoglobin 10.7 (L) 12.0 - 16.0 g/dL   HCT 30.9 (L) 35.0 - 47.0 %   MCV 97.6 80.0 - 100.0 fL   MCH 33.8 26.0 - 34.0 pg   MCHC 34.7 32.0 - 36.0 g/dL   RDW  13.5 11.5 - 14.5 %   Platelets 236 150 - 440 K/uL   ____________________________________________  EKG My review and personal interpretation at Time: 19:14   Indication: nausea  Rate: 90  Rhythm: sinus Axis: normal Other: non specific st changes, no stemi, normal intervals ____________________________________________  RADIOLOGY  I personally reviewed all radiographic images ordered to evaluate for the above acute complaints and reviewed radiology reports and findings.  These findings were personally discussed with the patient.  Please see medical record for radiology report.  ____________________________________________   PROCEDURES  Procedure(s) performed:  .Critical Care Performed by: Merlyn Lot, MD Authorized by: Merlyn Lot, MD   Critical care provider statement:    Critical care  time (minutes):  30   Critical care time was exclusive of:  Separately billable procedures and treating other patients   Critical care was time spent personally by me on the following activities:  Development of treatment plan with patient or surrogate, discussions with consultants, evaluation of patient's response to treatment, examination of patient, obtaining history from patient or surrogate, ordering and performing treatments and interventions, ordering and review of laboratory studies, ordering and review of radiographic studies, pulse oximetry, re-evaluation of patient's condition and review of old charts      Critical Care performed: yes  ____________________________________________   INITIAL IMPRESSION / ASSESSMENT AND PLAN / ED COURSE  Pertinent labs & imaging results that were available during my care of the patient were reviewed by me and considered in my medical decision making (see chart for details).  DDX: Asthma, copd, CHF, pna, ptx, malignancy, Pe, anemia   Deeanna Beightol is a 83 y.o. who presents to the ED with symptoms as described above.  Patient does have  evidence of probable lingular pneumonia based on chest x-ray and physical exam.  No hypoxia but she is tachycardic.  Patient is at higher risk due to her age as well as she is on Plaquenil with underlying lung disease.  port score is 93.  Patient also with acute hyponatremia for which I will give her IV fluids.  EKG shows some nonspecific changes likely rate dependent.  Seems less consistent with ACS, dissection, PE.  Her abdominal exam is otherwise soft and benign.  Based on her comorbidities high risk profile with concerning chest x-ray suggesting lingular pneumonia I do feel patient would benefit from admission to the hospital for continued IV hydration and hemodynamic monitoring with IV antibiotics.      ____________________________________________   FINAL CLINICAL IMPRESSION(S) / ED DIAGNOSES  Final diagnoses:  Community acquired pneumonia of left lower lobe of lung ([place])  Nausea  Hyponatremia      NEW MEDICATIONS STARTED DURING THIS VISIT:  This SmartLink is deprecated. Use AVSMEDLIST instead to display the medication list for a patient.   Note:  This document was prepared using Dragon voice recognition software and may include unintentional dictation errors.    Merlyn Lot, MD 08/23/17 [phone]

## 2017-08-23 NOTE — ED Triage Notes (Signed)
First Nurse Note:  Arrives from [facility] for ED evaluation. Patient  C/O Nausea x 1 week and fever x 3 days.  At [facility] heart rate was 118-125.  Tylenol last taken at 1300, temperature at [facility] was 98.  Patient also c/o decreased U/O x 24 hours and decreased PO intake x 3 days.

## 2017-08-23 NOTE — H&P (Addendum)
[place] [facility] [place] NAME: Crystal Gregory    MR#:  [id number]  DATE OF BIRTH:  18-Feb-1932  DATE OF ADMISSION:  08/23/2017  PRIMARY CARE PHYSICIAN: Pleas Koch, NP   REQUESTING/REFERRING PHYSICIAN:   CHIEF COMPLAINT:   Chief Complaint  Patient presents with  . Nausea  . Cough    HISTORY OF PRESENT ILLNESS: Crystal Gregory  is a 83 y.o. female with a known history of rheumatoid arthritis, interstitial lung disease, pulmonary fibrosis, rheumatoid arthritis, as well as below presented to the emergency room with 2-week history of not feeling well, generalized weakness/fatigue, cough, left-sided chest tightness/pain with deep breathing/cough, associated nausea, in the emergency room patient found to have left lingular pneumonia on chest x-ray, potassium 3.2, sodium 128, chloride 97, patient evaluated in the emergency room, no apparent distress, resting comfortably in bed, husband at the bedside, patient is now been admitted for community-acquired pneumonia in patient with immunocompromise state.  PAST MEDICAL HISTORY:   Past Medical History:  Diagnosis Date  . Allergy   . GERD (gastroesophageal reflux disease)   . Septicemia ([place]) 1999   spent 4 weeks in ICU, intubated -? PNA    PAST SURGICAL HISTORY:  Past Surgical History:  Procedure Laterality Date  . ABDOMINAL HYSTERECTOMY    . FLEXIBLE BRONCHOSCOPY N/A 07/05/2016   Procedure: FLEXIBLE BRONCHOSCOPY;  Surgeon: Vilinda Boehringer, MD;  Location: [facility] ORS;  Service: Cardiopulmonary;  Laterality: N/A;    SOCIAL HISTORY:  Social History   Tobacco Use  . Smoking status: Former Smoker    Packs/day: 0.50    Years: 30.00    Pack years: 15.00    Last attempt to quit: 01/16/1981    Years since quitting: 36.6  . Smokeless tobacco: Never Used  Substance Use Topics  . Alcohol use: Yes    Comment: 2 drinks daily     FAMILY HISTORY:  Family History  Problem Relation Age of Onset  .  Parkinson's disease Mother   . Colon polyps Mother 2          . Breast cancer Sister     DRUG ALLERGIES: No Known Allergies  REVIEW OF SYSTEMS:   CONSTITUTIONAL: + fever/fatigue/weakness.  EYES: No blurred or double vision.  EARS, NOSE, AND THROAT: No tinnitus or ear pain.  RESPIRATORY: + cough/shortness of breath/wheezing, no hemoptysis.  CARDIOVASCULAR: No chest pain, orthopnea, edema.  GASTROINTESTINAL:+ nausea, no vomiting, diarrhea or abdominal pain.  GENITOURINARY: No dysuria, hematuria.  ENDOCRINE: No polyuria, nocturia,  HEMATOLOGY: No anemia, easy bruising or bleeding SKIN: No rash or lesion. MUSCULOSKELETAL: No joint pain or arthritis.   NEUROLOGIC: No tingling, numbness, weakness.  PSYCHIATRY: No anxiety or depression.   MEDICATIONS AT HOME:  Prior to Admission medications   Medication Sig Start Date End Date Taking? Authorizing Provider  benzonatate (TESSALON) 200 MG capsule Take 1 capsule (200 mg total) 3 (three) times daily as needed by mouth for cough. 08/22/17   Laverle Hobby, MD  Calcium Carbonate-Vit D-Min (CALCIUM 1200 PO) Take by mouth.    [provider]  denosumab (PROLIA) 60 MG/ML SOLN injection Inject 60 mg into the skin every 6 (six) months. Administer in upper arm, thigh, or abdomen    [provider]  hydroxychloroquine (PLAQUENIL) 200 MG tablet Take 200 mg daily by mouth.  05/16/17   [provider]  leflunomide (ARAVA) 20 MG tablet Take 20 mg by mouth daily. 06/15/17   [provider]  pantoprazole ([place])  20 MG tablet Take 1 tablet (20 mg total) by mouth daily. 07/13/17   Lucille Passy, MD  predniSONE (DELTASONE) 10 MG tablet Take 1.5 tablets by mouth daily. 06/03/17   [provider]  propranolol (INDERAL) 10 MG tablet TAKE 1 TABLET TWICE A DAY 07/13/17   Lucille Passy, MD  traZODone (DESYREL) 50 MG tablet TAKE 1 TABLET AT BEDTIME 07/13/17   Lucille Passy, MD      PHYSICAL EXAMINATION:    VITAL SIGNS: Blood pressure 130/61, pulse 100, temperature (!) 97.5 F (36.4 C), temperature source Oral, resp. rate 16, height 5\' 8"  (1.727 m), weight 54.4 kg (120 lb), SpO2 98 %.  GENERAL:  83 y.o.-year-old patient lying in the bed with no acute distress.  Frail appearing EYES: Pupils equal, round, reactive to light and accommodation. No scleral icterus. Extraocular muscles intact.  HEENT: Head atraumatic, normocephalic. Oropharynx and nasopharynx clear.  NECK:  Supple, no jugular venous distention. No thyroid enlargement, no tenderness.  LUNGS: Bilateral coarse breath sounds with diminished breath sounds bilaterally, No use of accessory muscles of respiration.  CARDIOVASCULAR: S1, S2 normal. No murmurs, rubs, or gallops.  ABDOMEN: Soft, nontender, nondistended. Bowel sounds present. No organomegaly or mass.  EXTREMITIES: No pedal edema, cyanosis, or clubbing.  Diffuse muscular atrophy NEUROLOGIC: Cranial nerves II through XII are intact. MAES. Gait not checked.  PSYCHIATRIC: The patient is alert and oriented x 3.  SKIN: No obvious rash, lesion, or ulcer.   LABORATORY PANEL:   CBC Recent Labs  Lab 08/23/17 1625  WBC 7.5  HGB 10.7*  HCT 30.9*  PLT 236  MCV 97.6  MCH 33.8  MCHC 34.7  RDW 13.5   ------------------------------------------------------------------------------------------------------------------  Chemistries  Recent Labs  Lab 08/23/17 1625  NA 128*  K 3.2*  CL 97*  CO2 20*  GLUCOSE 129*  BUN 14  CREATININE 0.68  CALCIUM 7.9*   ------------------------------------------------------------------------------------------------------------------ estimated creatinine clearance is 45.8 mL/min (by C-G formula based on SCr of 0.68 mg/dL). ------------------------------------------------------------------------------------------------------------------ No results for input(s): TSH, T4TOTAL, T3FREE, THYROIDAB in the last 72 hours.  Invalid input(s):  FREET3   Coagulation profile No results for input(s): INR, PROTIME in the last 168 hours. ------------------------------------------------------------------------------------------------------------------- No results for input(s): DDIMER in the last 72 hours. -------------------------------------------------------------------------------------------------------------------  Cardiac Enzymes No results for input(s): CKMB, TROPONINI, MYOGLOBIN in the last 168 hours.  Invalid input(s): CK ------------------------------------------------------------------------------------------------------------------ Invalid input(s): POCBNP  ---------------------------------------------------------------------------------------------------------------  Urinalysis No results found for: COLORURINE, APPEARANCEUR, LABSPEC, PHURINE, GLUCOSEU, HGBUR, BILIRUBINUR, KETONESUR, PROTEINUR, UROBILINOGEN, NITRITE, LEUKOCYTESUR   RADIOLOGY: Dg Chest 2 View  Result Date: 08/23/2017 CLINICAL DATA:  Cough, nausea, lack of appetite for 1 week, history of interstitial lung disease/ pulmonary fibrosis, former smoker EXAM: CHEST  2 VIEW COMPARISON:  08/19/2017 FINDINGS: Normal heart size, mediastinal contours, and pulmonary vascularity. Atherosclerotic calcifications aorta. Emphysematous and bronchitic changes consistent with COPD. Chronic interstitial changes at the mid to lower lungs. Increased opacity at the LEFT lung base question developing superimposed acute infiltrate/pneumonia. No pleural effusion or pneumothorax. Bones unremarkable. IMPRESSION: Changes of COPD and chronic interstitial lung disease/ pulmonary fibrosis with suspect superimposed acute lingular consolidation/pneumonia. Electronically Signed   By: Lavonia Dana M.D.   On: 08/23/2017 17:28    EKG: Orders placed or performed during the hospital encounter of 08/23/17  . ED EKG  . ED EKG    IMPRESSION AND PLAN: 1 acute lingular  pneumonia/community-acquired Noted immunosuppressed state Admit to RNF bed on our pneumonia protocol, empiric Rocephin/azithromycin 5-7-day course, IVFs for rehydration,  mucolytic agents, supplemental oxygen as needed, and follow-up on cultures  2 acute on COPD/interstitial lung disease exacerbation Secondary to above Solu-Medrol IV with tapering as tolerated, supplemental oxygen prn, inhaled corticosteroids bid, and continue close medical monitoring  3 chronic GERD without esophagitis PPI daily  4 chronic rheumatoid arthritis Stable Continue Arava and Plaquenil  5 acute hyponatremia/hypochloremia/hypokalemia Replete with p.o. potassium/IV fluids, BMP in the morning  Full code Condition stable Prognosis fair DVT prophylaxis with Lovenox subcu Disposition Home in 2-3 days  All the records are reviewed and case discussed with ED provider. Management plans discussed with the patient, family and they are in agreement.  CODE STATUS: Code Status History    This patient does not have a recorded code status. Please follow your organizational policy for patients in this situation.    Advance Directive Documentation     Most Recent Value  Type of Advance Directive  Healthcare Power of Attorney, Living will  Pre-existing out of facility DNR order (yellow form or pink MOST form)  No data  "MOST" Form in Place?  No data       TOTAL TIME TAKING CARE OF THIS PATIENT: 40 minutes.    Avel Peace Johan Creveling M.D on 08/23/2017   Between 7am to 6pm - Pager - [phone]  After 6pm go to [email or web address] - password [email or web address] [facility]  Office  [phone]  CC: Primary care physician; Pleas Koch, NP   Note: This dictation was prepared with Dragon dictation along with smaller phrase technology. Any transcriptional errors that result from this process are unintentional.

## 2017-08-23 NOTE — ED Notes (Signed)
Pt states recent medication changes to RA medications. States x 1 week nausea, but no vomiting. States cough with rib cage pain when coughing. Pt states has pulmonary fibrosis. States hasn't eaten much x 1 week. Hasn't urinated x 24hrs. Was sent over from ED from [facility].   Alert, oriented. Husband at bedside.

## 2017-08-24 ENCOUNTER — Other Ambulatory Visit: Payer: Self-pay

## 2017-08-24 LAB — PROCALCITONIN: PROCALCITONIN: 0.52 ng/mL

## 2017-08-24 MED ORDER — PROCHLORPERAZINE EDISYLATE 5 MG/ML IJ SOLN
10.0000 mg | Freq: Four times a day (QID) | INTRAMUSCULAR | Status: DC | PRN
Start: 2017-08-24 — End: 2017-08-25
  Administered 2017-08-25: 10 mg via INTRAVENOUS
  Filled 2017-08-24 (×2): qty 2

## 2017-08-24 MED ORDER — ENSURE ENLIVE PO LIQD
237.0000 mL | Freq: Two times a day (BID) | ORAL | Status: DC
  Administered 2017-08-25: 10:00:00 237 mL via ORAL

## 2017-08-24 NOTE — Progress Notes (Signed)
[facility] [place] NAME: Crystal Gregory    MR#:  [id number]  DATE OF BIRTH:  1932-10-04  SUBJECTIVE:  CHIEF COMPLAINT:   Chief Complaint  Patient presents with  . Nausea  . Cough  c/o nausea. cough REVIEW OF SYSTEMS:  Review of Systems  Constitutional: Positive for malaise/fatigue. Negative for chills, fever and weight loss.  HENT: Negative for nosebleeds and sore throat.   Eyes: Negative for blurred vision.  Respiratory: Positive for cough. Negative for shortness of breath and wheezing.   Cardiovascular: Negative for chest pain, orthopnea, leg swelling and PND.  Gastrointestinal: Positive for nausea. Negative for abdominal pain, constipation, diarrhea, heartburn and vomiting.  Genitourinary: Negative for dysuria and urgency.  Musculoskeletal: Negative for back pain.  Skin: Negative for rash.  Neurological: Positive for weakness. Negative for dizziness, speech change, focal weakness and headaches.  Endo/Heme/Allergies: Does not bruise/bleed easily.  Psychiatric/Behavioral: Negative for depression.    DRUG ALLERGIES:  No Known Allergies VITALS:  Blood pressure 116/60, pulse 81, temperature 97.6 F (36.4 C), temperature source Oral, resp. rate 18, height 5\' 8"  (1.727 m), weight 54.3 kg (119 lb 12.8 oz), SpO2 100 %. PHYSICAL EXAMINATION:  Physical Exam  Constitutional: She is oriented to person, place, and time and well-developed, well-nourished, and in no distress.  HENT:  Head: Normocephalic and atraumatic.  Eyes: Conjunctivae and EOM are normal. Pupils are equal, round, and reactive to light.  Neck: Normal range of motion. Neck supple. No tracheal deviation present. No thyromegaly present.  Cardiovascular: Normal rate, regular rhythm and normal heart sounds.  Pulmonary/Chest: Effort normal and breath sounds normal. No respiratory distress. She has no wheezes. She exhibits no tenderness.  Abdominal: Soft. Bowel sounds are  normal. She exhibits no distension. There is no tenderness.  Musculoskeletal: Normal range of motion.  Neurological: She is alert and oriented to person, place, and time. No cranial nerve deficit.  Skin: Skin is warm and dry. No rash noted.  Psychiatric: Mood and affect normal.   LABORATORY PANEL:  Female CBC Recent Labs  Lab 08/23/17 1625  WBC 7.5  HGB 10.7*  HCT 30.9*  PLT 236   ------------------------------------------------------------------------------------------------------------------ Chemistries  Recent Labs  Lab 08/23/17 1625  NA 128*  K 3.2*  CL 97*  CO2 20*  GLUCOSE 129*  BUN 14  CREATININE 0.68  CALCIUM 7.9*   RADIOLOGY:  Dg Chest 2 View  Result Date: 08/23/2017 CLINICAL DATA:  Cough, nausea, lack of appetite for 1 week, history of interstitial lung disease/ pulmonary fibrosis, former smoker EXAM: CHEST  2 VIEW COMPARISON:  08/19/2017 FINDINGS: Normal heart size, mediastinal contours, and pulmonary vascularity. Atherosclerotic calcifications aorta. Emphysematous and bronchitic changes consistent with COPD. Chronic interstitial changes at the mid to lower lungs. Increased opacity at the LEFT lung base question developing superimposed acute infiltrate/pneumonia. No pleural effusion or pneumothorax. Bones unremarkable. IMPRESSION: Changes of COPD and chronic interstitial lung disease/ pulmonary fibrosis with suspect superimposed acute lingular consolidation/pneumonia. Electronically Signed   By: Lavonia Dana M.D.   On: 08/23/2017 17:28   ASSESSMENT AND PLAN:   1 Acute lingular pneumonia/community-acquired - immunosuppressed state - Elevated Pro-calcitonin so continue Rocephin + Azithromycin 5 day course,  - Continue IVFs for rehydration, mucolytic agents, supplemental oxygen as needed, and follow-up on cultures  2 Acute on COPD/interstitial lung disease exacerbation - continue Solu-Medrol IV with tapering as tolerated, supplemental oxygen prn, inhaled  corticosteroids bid  3 chronic GERD without esophagitis PPI daily  4 chronic rheumatoid arthritis - Continue Arava and Plaquenil - Follows with Dr Jefm Bryant  5 Acute hyponatremia/hypochloremia/hypokalemia Replete and Recheck, continue IVF. Na 128       All the records are reviewed and case discussed with Care Management/Social Worker. Management plans discussed with the patient, nursing and they are in agreement.  CODE STATUS: Full Code  TOTAL TIME TAKING CARE OF THIS PATIENT: 35 minutes.   More than 50% of the time was spent in counseling/coordination of care: YES  POSSIBLE D/C IN 1-2 DAYS, DEPENDING ON CLINICAL CONDITION.   Max Sane M.D on 08/24/2017 at 12:52 PM  Between 7am to 6pm - Pager - [phone]  After 6pm go to [email or web address] - Proofreader  [facility]  Office  [phone]  CC: Primary care physician; Pleas Koch, NP  Note: This dictation was prepared with Dragon dictation along with smaller phrase technology. Any transcriptional errors that result from this process are unintentional.

## 2017-08-24 NOTE — Progress Notes (Signed)
Initial Nutrition Assessment  DOCUMENTATION CODES:   Severe malnutrition in context of chronic illness  INTERVENTION:  1. Ensure Enlive po BID, each supplement provides 350 kcal and 20 grams of protein  NUTRITION DIAGNOSIS:   Severe Malnutrition related to chronic illness as evidenced by severe fat depletion, severe muscle depletion, percent weight loss.  GOAL:   Patient will meet greater than or equal to 90% of their needs  MONITOR:   PO intake, I & O's, Labs, Supplement acceptance, Weight trends  REASON FOR ASSESSMENT:   Malnutrition Screening Tool    ASSESSMENT:   Crystal Gregory  is a 83 y.o. female with a known history of rheumatoid arthritis, interstitial lung disease, pulmonary fibrosis, rheumatoid arthritis, as well as below presented to the emergency room with 2-week history of not feeling well, generalized weakness/fatigue, cough, left-sided chest tightness/pain with deep breathing/cough, associated nausea, in the emergency room patient found to have left lingular pneumonia on chest x-ray  Spoke with Crystal Gregory at bedside. She reports no PO intake x1 week due to being unable to swallow anything. Reports losing 10 pounds during that time a 7.6% severe weight loss, UBW of 130 pounds. She ate well today for breakfast and lunch, 90% meal completion documented. Her and her husband reside at [place], where they can choose to eat already prepared meals or cook for themselves. She has trouble swallowing meat, and thus eats more cheese, eggs and omelets, along with some chicken to maintain protein intake.  Labs reviewed:  Na 128, K+ 3.2  Medications reviewed and include:  Solumedrol NS 20K+ at 174mL/hr    NUTRITION - FOCUSED PHYSICAL EXAM:    Most Recent Value  Orbital Region  Moderate depletion  Upper Arm Region  Severe depletion  Thoracic and Lumbar Region  Moderate depletion  Buccal Region  Moderate depletion  Temple Region  Severe depletion  Clavicle Bone Region   Severe depletion  Clavicle and Acromion Bone Region  Severe depletion  Scapular Bone Region  Severe depletion  Dorsal Hand  Severe depletion  Patellar Region  Moderate depletion  Anterior Thigh Region  Moderate depletion  Posterior Calf Region  Severe depletion  Edema (RD Assessment)  None       Diet Order:  Diet Heart Room service appropriate? Yes; Fluid consistency: Thin  EDUCATION NEEDS:   Education needs have been addressed  Skin:  Skin Assessment: Reviewed RN Assessment  Last BM:  08/23/2017  Height:   Ht Readings from Last 1 Encounters:  08/23/17 5\' 8"  (1.727 m)    Weight:   Wt Readings from Last 1 Encounters:  08/23/17 119 lb 12.8 oz (54.3 kg)    Ideal Body Weight:  63.63 kg  BMI:  Body mass index is 18.22 kg/m.  Estimated Nutritional Needs:   Kcal:  1350-1650 calories  Protein:  81-92 grams  Fluid:  >1.5L   Crystal Gregory. Crystal Butrick, MS, RD LDN Inpatient Clinical Dietitian Pager [phone]

## 2017-08-24 NOTE — Plan of Care (Signed)
Pt. Provided general education.Patient comfort is expressed and Pain currently under control. Pt. nutrition Is progressing with IV fluid intake. Pt. Continues to decline medications and food by mouth has been educated. Will continue to work with pt. In progression toward recovery home.

## 2017-08-25 LAB — URINE CULTURE

## 2017-08-25 LAB — BASIC METABOLIC PANEL
ANION GAP: 9 (ref 5–15)
BUN: 16 mg/dL (ref 6–20)
CALCIUM: 6.7 mg/dL — AB (ref 8.9–10.3)
CO2: 19 mmol/L — ABNORMAL LOW (ref 22–32)
Chloride: 110 mmol/L (ref 101–111)
Creatinine, Ser: 0.54 mg/dL (ref 0.44–1.00)
GLUCOSE: 135 mg/dL — AB (ref 65–99)
Potassium: 3.7 mmol/L (ref 3.5–5.1)
SODIUM: 138 mmol/L (ref 135–145)

## 2017-08-25 LAB — CBC
HEMATOCRIT: 26.2 % — AB (ref 35.0–47.0)
HEMOGLOBIN: 8.9 g/dL — AB (ref 12.0–16.0)
MCH: 33.6 pg (ref 26.0–34.0)
MCHC: 34.1 g/dL (ref 32.0–36.0)
MCV: 98.7 fL (ref 80.0–100.0)
PLATELETS: 225 10*3/uL (ref 150–440)
RBC: 2.66 MIL/uL — ABNORMAL LOW (ref 3.80–5.20)
RDW: 14.2 % (ref 11.5–14.5)
WBC: 6.1 10*3/uL (ref 3.6–11.0)

## 2017-08-25 LAB — HIV ANTIBODY (ROUTINE TESTING W REFLEX): HIV Screen 4th Generation wRfx: NONREACTIVE

## 2017-08-25 MED ORDER — CEFUROXIME AXETIL 250 MG PO TABS: 250.0000 mg | ORAL_TABLET | Freq: Two times a day (BID) | ORAL | 0 refills | Status: AC

## 2017-08-25 MED ORDER — METHYLPREDNISOLONE SODIUM SUCC 40 MG IJ SOLR
40.0000 mg | Freq: Two times a day (BID) | INTRAMUSCULAR | Status: DC
  Administered 2017-08-25: 10:00:00 40 mg via INTRAVENOUS
  Filled 2017-08-25: qty 1

## 2017-08-25 MED ORDER — ONDANSETRON 4 MG PO TBDP
4.0000 mg | ORAL_TABLET | Freq: Three times a day (TID) | ORAL | 0 refills | Status: DC | PRN
Start: 2017-08-25 — End: 2017-11-25

## 2017-08-25 MED ORDER — METHYLPREDNISOLONE SODIUM SUCC 40 MG IJ SOLR: 40.0000 mg | INTRAMUSCULAR | Status: DC

## 2017-08-25 MED ORDER — AZITHROMYCIN 250 MG PO TABS: 250.0000 mg | ORAL_TABLET | Freq: Every day | ORAL | 0 refills | Status: DC

## 2017-08-25 NOTE — Progress Notes (Signed)
Discharge paperwork reviewed with patient who verbalized understanding of current medications and follow up appointment. Patient is stable and ready for discharge. Ambulating with steady gait independently. Husband to transport home.

## 2017-08-25 NOTE — Discharge Summary (Signed)
[facility] [place] NAME: Crystal Gregory    MR#:  [id number]  DATE OF BIRTH:  12/31/1931  DATE OF ADMISSION:  08/23/2017 ADMITTING PHYSICIAN: Gorden Harms, MD  DATE OF DISCHARGE: 08/25/2017   PRIMARY CARE PHYSICIAN: Pleas Koch, NP    ADMISSION DIAGNOSIS:  Hyponatremia [E87.1] Nausea [R11.0] Community acquired pneumonia of left lower lobe of lung ([place]) [J18.1]  DISCHARGE DIAGNOSIS:  Active Problems:   CAP (community acquired pneumonia)   SECONDARY DIAGNOSIS:   Past Medical History:  Diagnosis Date  . Allergy   . GERD (gastroesophageal reflux disease)   . Septicemia ([place]) 1999   spent 4 weeks in ICU, intubated -? PNA    HOSPITAL COURSE:   1Acute lingular pneumonia/community-acquired - immunosuppressed state - Elevated Pro-calcitonin so continue Rocephin + Azithromycin 5 day course,  - Continue IVFsfor rehydration, mucolytic agents, supplemental oxygen as needed, and follow-up on cultures   Feeling much better now.  2Acute on COPD/interstitial lung disease exacerbation - continue Solu-Medrol IV with tapering as tolerated, supplemental oxygenprn, inhaled corticosteroidsbid - much improved, no wheezing now. - She is on oral steroids at home, resume that,  3chronic GERD without esophagitis PPI daily  4chronic rheumatoid arthritis - Continue Plaquenil, She said- she had stopped Arava at home- so I will hold it for now- until she follow with her rheumatology. - Follows with Dr Jefm Bryant  5Acute hyponatremia/hypochloremia/hypokalemia Replete and Recheck, continue IVF. Na 128 Corrected.   DISCHARGE CONDITIONS:   Stable.  CONSULTS OBTAINED:    DRUG ALLERGIES:  No Known Allergies  DISCHARGE MEDICATIONS:   Current Discharge Medication List    START taking these medications   Details  azithromycin (ZITHROMAX) 250 MG tablet Take 1 tablet (250 mg total) by mouth daily for 3  days. Qty: 3 each, Refills: 0    cefUROXime (CEFTIN) 250 MG tablet Take 1 tablet (250 mg total) by mouth 2 (two) times daily for 3 days. Qty: 6 tablet, Refills: 0    ondansetron (ZOFRAN ODT) 4 MG disintegrating tablet Take 1 tablet (4 mg total) by mouth every 8 (eight) hours as needed for nausea or vomiting. Qty: 20 tablet, Refills: 0      CONTINUE these medications which have NOT CHANGED   Details  benzonatate (TESSALON) 200 MG capsule Take 1 capsule (200 mg total) 3 (three) times daily as needed by mouth for cough. Qty: 20 capsule, Refills: 0    denosumab (PROLIA) 60 MG/ML SOLN injection Inject 60 mg into the skin every 6 (six) months. Administer in upper arm, thigh, or abdomen    hydroxychloroquine (PLAQUENIL) 200 MG tablet Take 200 mg daily by mouth.     pantoprazole (PROTONIX) 20 MG tablet Take 1 tablet (20 mg total) by mouth daily. Qty: 90 tablet, Refills: 3    predniSONE (DELTASONE) 10 MG tablet Take 1.5 tablets by mouth daily.    propranolol (INDERAL) 10 MG tablet TAKE 1 TABLET TWICE A DAY Qty: 180 tablet, Refills: 3    traZODone (DESYREL) 50 MG tablet TAKE 1 TABLET AT BEDTIME Qty: 90 tablet, Refills: 3      STOP taking these medications     leflunomide (ARAVA) 20 MG tablet          DISCHARGE INSTRUCTIONS:    Follow with PMD in 1-2 weeks.  If you experience worsening of your admission symptoms, develop shortness of breath, life threatening emergency, suicidal or homicidal thoughts you must seek medical attention immediately by calling  911 or calling your MD immediately  if symptoms less severe.  You Must read complete instructions/literature along with all the possible adverse reactions/side effects for all the Medicines you take and that have been prescribed to you. Take any new Medicines after you have completely understood and accept all the possible adverse reactions/side effects.   Please note  You were cared for by a hospitalist during your hospital  stay. If you have any questions about your discharge medications or the care you received while you were in the hospital after you are discharged, you can call the unit and asked to speak with the hospitalist on call if the hospitalist that took care of you is not available. Once you are discharged, your primary care physician will handle any further medical issues. Please note that NO REFILLS for any discharge medications will be authorized once you are discharged, as it is imperative that you return to your primary care physician (or establish a relationship with a primary care physician if you do not have one) for your aftercare needs so that they can reassess your need for medications and monitor your lab values.    Today   CHIEF COMPLAINT:   Chief Complaint  Patient presents with  . Nausea  . Cough    HISTORY OF PRESENT ILLNESS:  Crystal Gregory  is a 83 y.o. female with a known history of rheumatoid arthritis, interstitial lung disease, pulmonary fibrosis, rheumatoid arthritis, as well as below presented to the emergency room with 2-week history of not feeling well, generalized weakness/fatigue, cough, left-sided chest tightness/pain with deep breathing/cough, associated nausea, in the emergency room patient found to have left lingular pneumonia on chest x-ray, potassium 3.2, sodium 128, chloride 97, patient evaluated in the emergency room, no apparent distress, resting comfortably in bed, husband at the bedside, patient is now been admitted for community-acquired pneumonia in patient with immunocompromise state.   VITAL SIGNS:  Blood pressure 122/60, pulse 76, temperature (!) 96.5 F (35.8 C), temperature source Axillary, resp. rate 18, height 5\' 8"  (1.727 m), weight 54.3 kg (119 lb 12.8 oz), SpO2 95 %.  I/O:    Intake/Output Summary (Last 24 hours) at 08/25/2017 0931 Last data filed at 08/25/2017 0303 Gross per 24 hour  Intake 2582.5 ml  Output 1000 ml  Net 1582.5 ml     PHYSICAL EXAMINATION:  GENERAL:  83 y.o.-year-old patient lying in the bed with no acute distress.  EYES: Pupils equal, round, reactive to light and accommodation. No scleral icterus. Extraocular muscles intact.  HEENT: Head atraumatic, normocephalic. Oropharynx and nasopharynx clear.  NECK:  Supple, no jugular venous distention. No thyroid enlargement, no tenderness.  LUNGS: Normal breath sounds bilaterally, no wheezing, rales,rhonchi or crepitation. No use of accessory muscles of respiration.  CARDIOVASCULAR: S1, S2 normal. No murmurs, rubs, or gallops.  ABDOMEN: Soft, non-tender, non-distended. Bowel sounds present. No organomegaly or mass.  EXTREMITIES: No pedal edema, cyanosis, or clubbing.  NEUROLOGIC: Cranial nerves II through XII are intact. Muscle strength 5/5 in all extremities. Sensation intact. Gait not checked.  PSYCHIATRIC: The patient is alert and oriented x 3.  SKIN: No obvious rash, lesion, or ulcer.   DATA REVIEW:   CBC Recent Labs  Lab 08/25/17 0331  WBC 6.1  HGB 8.9*  HCT 26.2*  PLT 225    Chemistries  Recent Labs  Lab 08/25/17 0331  NA 138  K 3.7  CL 110  CO2 19*  GLUCOSE 135*  BUN 16  CREATININE 0.54  CALCIUM  6.7*    Cardiac Enzymes Recent Labs  Lab 08/23/17 1914  TROPONINI 0.03*    Microbiology Results  Results for orders placed or performed during the hospital encounter of 08/23/17  Blood Culture (routine x 2)     Status: None (Preliminary result)   Collection Time: 08/23/17  7:14 PM  Result Value Ref Range Status   Specimen Description BLOOD BLOOD LEFT WRIST  Final   Special Requests   Final    BOTTLES DRAWN AEROBIC AND ANAEROBIC Blood Culture adequate volume   Culture NO GROWTH 2 DAYS  Final   Report Status PENDING  Incomplete  Blood Culture (routine x 2)     Status: None (Preliminary result)   Collection Time: 08/23/17  7:15 PM  Result Value Ref Range Status   Specimen Description BLOOD LEFT ANTECUBITAL  Final   Special  Requests   Final    BOTTLES DRAWN AEROBIC AND ANAEROBIC Blood Culture adequate volume   Culture NO GROWTH 2 DAYS  Final   Report Status PENDING  Incomplete    RADIOLOGY:  Dg Chest 2 View  Result Date: 08/23/2017 CLINICAL DATA:  Cough, nausea, lack of appetite for 1 week, history of interstitial lung disease/ pulmonary fibrosis, former smoker EXAM: CHEST  2 VIEW COMPARISON:  08/19/2017 FINDINGS: Normal heart size, mediastinal contours, and pulmonary vascularity. Atherosclerotic calcifications aorta. Emphysematous and bronchitic changes consistent with COPD. Chronic interstitial changes at the mid to lower lungs. Increased opacity at the LEFT lung base question developing superimposed acute infiltrate/pneumonia. No pleural effusion or pneumothorax. Bones unremarkable. IMPRESSION: Changes of COPD and chronic interstitial lung disease/ pulmonary fibrosis with suspect superimposed acute lingular consolidation/pneumonia. Electronically Signed   By: Lavonia Dana M.D.   On: 08/23/2017 17:28    EKG:   Orders placed or performed during the hospital encounter of 08/23/17  . ED EKG  . ED EKG      Management plans discussed with the patient, family and they are in agreement.  CODE STATUS:     Code Status Orders  (From admission, onward)        Start     Ordered   08/23/17 2110  Full code  Continuous     08/23/17 2110    Code Status History    Date Active Date Inactive Code Status Order ID Comments User Context   This patient has a current code status but no historical code status.    Advance Directive Documentation     Most Recent Value  Type of Advance Directive  Healthcare Power of Attorney, Living will  Pre-existing out of facility DNR order (yellow form or pink MOST form)  No data  "MOST" Form in Place?  No data      TOTAL TIME TAKING CARE OF THIS PATIENT: 35 minutes.    Vaughan Basta M.D on 08/25/2017 at 9:31 AM  Between 7am to 6pm - Pager - [phone]  After  6pm go to [email or web address] - password [email or web address]  Office  [phone]  CC: Primary care physician; Pleas Koch, NP   Note: This dictation was prepared with Dragon dictation along with smaller phrase technology. Any transcriptional errors that result from this process are unintentional.

## 2017-08-26 LAB — CULTURE, RESPIRATORY

## 2017-08-26 LAB — CULTURE, RESPIRATORY W GRAM STAIN

## 2017-08-28 LAB — CULTURE, BLOOD (ROUTINE X 2)
CULTURE: NO GROWTH
Culture: NO GROWTH
SPECIAL REQUESTS: ADEQUATE
Special Requests: ADEQUATE

## 2017-08-29 ENCOUNTER — Other Ambulatory Visit: Payer: Self-pay

## 2017-08-29 NOTE — Patient Outreach (Signed)
[facility]) Care Management  08/29/2017  Crystal Gregory [birth year] [id number]   EMMI: Pneumonia Referral Date: 08/29/17 Referral Source: EMMI  Referral Reason: Follow up appointment? No Day #  1   Outreach attempt # 1 Spoke with patient. Reviewed and addressed red alert. Patient reports that she has been slow to recover but is doing some better.  She reports that she has finished her antibiotic so the nausea and diarrhea should subside.  Patient reports she has an appointment with the physician for follow up on Wednesday.  Patient reports she had her husband and other helping her over the holiday weekend.  Patient reports some chest tightness that she attributes to her coughing.  Patient feels all her needs are being met and has no further questions.      Plan: RN CM will close case as patient has no further needs.    Jone Baseman, RN, MSN [facility] Care Management Care Management Coordinator Direct Line [phone] Toll Free: [phone]  Fax: [phone]

## 2017-08-31 ENCOUNTER — Ambulatory Visit (INDEPENDENT_AMBULATORY_CARE_PROVIDER_SITE_OTHER): Payer: Medicare Other | Admitting: Primary Care

## 2017-08-31 ENCOUNTER — Encounter: Payer: Self-pay | Admitting: Primary Care

## 2017-08-31 VITALS — BP 122/66 | HR 90 | Temp 97.7°F | Wt 120.4 lb

## 2017-08-31 DIAGNOSIS — K219 Gastro-esophageal reflux disease without esophagitis: Secondary | ICD-10-CM

## 2017-08-31 DIAGNOSIS — G4700 Insomnia, unspecified: Secondary | ICD-10-CM

## 2017-08-31 DIAGNOSIS — K121 Other forms of stomatitis: Secondary | ICD-10-CM | POA: Diagnosis not present

## 2017-08-31 DIAGNOSIS — J84112 Idiopathic pulmonary fibrosis: Secondary | ICD-10-CM | POA: Diagnosis not present

## 2017-08-31 DIAGNOSIS — I2510 Atherosclerotic heart disease of native coronary artery without angina pectoris: Secondary | ICD-10-CM

## 2017-08-31 DIAGNOSIS — M069 Rheumatoid arthritis, unspecified: Secondary | ICD-10-CM | POA: Diagnosis not present

## 2017-08-31 DIAGNOSIS — J849 Interstitial pulmonary disease, unspecified: Secondary | ICD-10-CM | POA: Diagnosis not present

## 2017-08-31 DIAGNOSIS — J189 Pneumonia, unspecified organism: Secondary | ICD-10-CM | POA: Diagnosis not present

## 2017-08-31 DIAGNOSIS — M810 Age-related osteoporosis without current pathological fracture: Secondary | ICD-10-CM

## 2017-08-31 MED ORDER — TRIAMCINOLONE ACETONIDE 0.1 % MT PSTE: 1.0000 "application " | PASTE | Freq: Two times a day (BID) | OROMUCOSAL | 0 refills | Status: DC

## 2017-08-31 NOTE — Assessment & Plan Note (Signed)
Admitted to [facility] for pneumonia given history of interstitial lung disease and immunocompromisation from rheumatoid arthritis treatment.  Overall slight improvement, however still struggling to recover.  Exam today with overall clear lungs, normal vitals, does not appear sickly.  We will repeat chest x-ray in 2 weeks to ensure resolve of pneumonia.

## 2017-08-31 NOTE — Progress Notes (Signed)
Subjective:    Patient ID: Crystal Gregory, female    DOB: 03/15/1932, 84 y.o.   MRN: [id number]  HPI  Crystal Gregory is an 83 year old female who presents today to transfer care from Dr. Deborra Medina and for hospital follow up.  1) Rheumatoid Arthritis: Diagnosed in Summer 2018. Currently managed on hydroxychloroquine 400 mg. Previously managed on prednisone, [place]. Currently following with endocrinology.   2) GERD: Currently managed on pantoprazole 20 mg. Symptoms include esophageal burning, difficulty swallowing without her medication.  Overall she feels well managed on this regimen.  3) Idiopathic Pulmonary Fibrosis/Bronchiectasis/Interstitial Lung Disease: Diagnosed 1-2 years ago. She's undergone pulmonary rehabilitation sessions. She is following with pulmonologist.  Her rheumatoid arthritis is thought to be the cause of her pulmonary issues.  4) Insomnia: Currently managed on Trazodone 50 mg for which she takes as needed for sleep. She takes this every other night.   5) Hospital Follow Up:  Presented to [facility] ED on 08/23/17 with a three week history of nausea and cough. During her stay in the emergency department she underwent chest xray (pneumonia), lab work (hyonatremia), ECG (non specific changes). She was admitted for treatment of pneumonia.  During her hospital stay she was treated with IV antibiotics, IV steroids, and supplemental oxygen. Her hyponatremia corrected. She felt much improved after treatment and was discharged home on 08/25/17. She was discharged home on oral Azithromycin and Ceftin.   Since her discharge home she's feeling slightly better. She's still experiencing exertional shortness of breath. Her nausea has resolved.  She completed her course of antibiotics.  She's experiencing pain to the left side of her tongue which began prior to her hospital stay.  She was treated by her pulmonologist with Duke's Magic mouthwash but has not found this to be effective.  She denies fevers.     Review of Systems  Constitutional: Positive for fatigue.  HENT:       Oral ulcer  Respiratory: Positive for shortness of breath. Negative for cough.   Gastrointestinal: Negative for nausea.       GERD  Musculoskeletal: Positive for arthralgias.       Past Medical History:  Diagnosis Date  . Allergy   . GERD (gastroesophageal reflux disease)   . Septicemia ([place]) 1999   spent 4 weeks in ICU, intubated -? PNA     Social History   Socioeconomic History  . Marital status: Married    Spouse name: Not on file  . Number of children: 2  . Years of education: Not on file  . Highest education level: Not on file  Social Needs  . Financial resource strain: Not on file  . Food insecurity - worry: Not on file  . Food insecurity - inability: Not on file  . Transportation needs - medical: Not on file  . Transportation needs - non-medical: Not on file  Occupational History  . Occupation: Retired   Tobacco Use  . Smoking status: Former Smoker    Packs/day: 0.50    Years: 30.00    Pack years: 15.00    Last attempt to quit: 01/16/1981    Years since quitting: 36.6  . Smokeless tobacco: Never Used  Substance and Sexual Activity  . Alcohol use: Yes    Comment: 2 drinks daily   . Drug use: No  . Sexual activity: Not on file  Other Topics Concern  . Not on file  Social History Narrative   Married.     2 children.  Lives  with her husband in [place].    Past Surgical History:  Procedure Laterality Date  . ABDOMINAL HYSTERECTOMY    . FLEXIBLE BRONCHOSCOPY N/A 07/05/2016   Procedure: FLEXIBLE BRONCHOSCOPY;  Surgeon: Vilinda Boehringer, MD;  Location: [facility] ORS;  Service: Cardiopulmonary;  Laterality: N/A;    Family History  Problem Relation Age of Onset  . Parkinson's disease Mother   . Colon polyps Mother 85          . Breast cancer Sister     No Known Allergies  Current Outpatient Medications on File Prior to Visit  Medication Sig Dispense Refill  . benzonatate  (TESSALON) 200 MG capsule Take 1 capsule (200 mg total) 3 (three) times daily as needed by mouth for cough. 20 capsule 0  . denosumab (PROLIA) 60 MG/ML SOLN injection Inject 60 mg into the skin every 6 (six) months. Administer in upper arm, thigh, or abdomen    . hydroxychloroquine (PLAQUENIL) 200 MG tablet Take 200 mg daily by mouth.     . ondansetron (ZOFRAN ODT) 4 MG disintegrating tablet Take 1 tablet (4 mg total) by mouth every 8 (eight) hours as needed for nausea or vomiting. 20 tablet 0  . pantoprazole (PROTONIX) 20 MG tablet Take 1 tablet (20 mg total) by mouth daily. 90 tablet 3  . propranolol (INDERAL) 10 MG tablet TAKE 1 TABLET TWICE A DAY 180 tablet 3  . traZODone (DESYREL) 50 MG tablet TAKE 1 TABLET AT BEDTIME 90 tablet 3   No current facility-administered medications on file prior to visit.     BP 122/66   Pulse 90   Temp 97.7 F (36.5 C) (Oral)   Wt 120 lb 6.4 oz (54.6 kg)   SpO2 98%   BMI 18.31 kg/m    Objective:   Physical Exam  Constitutional: She appears well-nourished.  HENT:  0.5 cm circular ulcer to mid aspect of left lateral tongue  Neck: Neck supple.  Cardiovascular: Normal rate and regular rhythm.  Pulmonary/Chest: Effort normal and breath sounds normal. She has no wheezes. She has no rales.  Skin: Skin is warm and dry.  Psychiatric: She has a normal mood and affect.          Assessment & Plan:  Oral ulcer:  Located to the left lateral tongue for several weeks. No improvement with Duke's Magic mouthwash. Prescription for triamcinolone paste sent to pharmacy with instructions for use provided today. She will update if no improvement.  Sheral Flow, NP

## 2017-08-31 NOTE — Assessment & Plan Note (Addendum)
Following with pulmonology 

## 2017-08-31 NOTE — Assessment & Plan Note (Signed)
Following with pulmonology 

## 2017-08-31 NOTE — Patient Instructions (Signed)
Apply the triamcinolone paste twice daily before meals. Allow 30 minutes for absorption prior to eating.  Return on or after December 12th for your repeat chest xray. You may come in anytime at your convenience.   It was a pleasure meeting you!

## 2017-08-31 NOTE — Assessment & Plan Note (Signed)
With following with endocrinology.  Continue Plaquenil.

## 2017-08-31 NOTE — Assessment & Plan Note (Signed)
Using trazodone as needed for insomnia.  Continue same.

## 2017-08-31 NOTE — Assessment & Plan Note (Signed)
Feels well managed on current regimen, continue.

## 2017-08-31 NOTE — Assessment & Plan Note (Signed)
Managed on Prolia injections semiannually.

## 2017-09-13 DIAGNOSIS — J84112 Idiopathic pulmonary fibrosis: Secondary | ICD-10-CM | POA: Diagnosis not present

## 2017-09-13 DIAGNOSIS — Z79899 Other long term (current) drug therapy: Secondary | ICD-10-CM | POA: Diagnosis not present

## 2017-09-13 DIAGNOSIS — J849 Interstitial pulmonary disease, unspecified: Secondary | ICD-10-CM | POA: Diagnosis not present

## 2017-09-13 DIAGNOSIS — M059 Rheumatoid arthritis with rheumatoid factor, unspecified: Secondary | ICD-10-CM | POA: Diagnosis not present

## 2017-09-13 DIAGNOSIS — J312 Chronic pharyngitis: Secondary | ICD-10-CM | POA: Diagnosis not present

## 2017-09-19 ENCOUNTER — Ambulatory Visit (INDEPENDENT_AMBULATORY_CARE_PROVIDER_SITE_OTHER)
Admission: RE | Admit: 2017-09-19 | Discharge: 2017-09-19 | Disposition: A | Discharge status: Home / Self Care | Payer: Medicare Other | Source: Ambulatory Visit | Attending: Primary Care | Admitting: Primary Care

## 2017-09-19 DIAGNOSIS — J189 Pneumonia, unspecified organism: Secondary | ICD-10-CM

## 2017-09-20 ENCOUNTER — Ambulatory Visit: Payer: Medicare Other | Admitting: Primary Care

## 2017-10-12 ENCOUNTER — Telehealth: Payer: Self-pay | Admitting: Pulmonary Disease

## 2017-10-12 NOTE — Telephone Encounter (Signed)
Pt calling to schedule PFT and follow up visit with DS for February. Thanks.

## 2017-10-12 NOTE — Telephone Encounter (Signed)
Crystal Gregory has her PFT scheduled on 11/22/2017 @ 9:30am and her return office visit with Dr. Alva Garnet on 11/25/2017 @ 9:00am. Crystal Gregory is aware of these appts.

## 2017-10-12 NOTE — Telephone Encounter (Signed)
Pt needs PFT prior to fu  Please call back to schedule this

## 2017-11-03 DIAGNOSIS — L659 Nonscarring hair loss, unspecified: Secondary | ICD-10-CM | POA: Diagnosis not present

## 2017-11-03 DIAGNOSIS — M059 Rheumatoid arthritis with rheumatoid factor, unspecified: Secondary | ICD-10-CM | POA: Diagnosis not present

## 2017-11-03 DIAGNOSIS — J849 Interstitial pulmonary disease, unspecified: Secondary | ICD-10-CM | POA: Diagnosis not present

## 2017-11-03 DIAGNOSIS — Z79899 Other long term (current) drug therapy: Secondary | ICD-10-CM | POA: Diagnosis not present

## 2017-11-22 ENCOUNTER — Ambulatory Visit
Admission: RE | Admit: 2017-11-22 | Discharge: 2017-11-22 | Disposition: A | Discharge status: Home / Self Care | Payer: Medicare Other | Source: Ambulatory Visit | Attending: Pulmonary Disease | Admitting: Pulmonary Disease

## 2017-11-22 ENCOUNTER — Ambulatory Visit (HOSPITAL_COMMUNITY): Payer: Medicare Other

## 2017-11-22 ENCOUNTER — Other Ambulatory Visit: Payer: Self-pay | Admitting: Pulmonary Disease

## 2017-11-22 DIAGNOSIS — J8410 Pulmonary fibrosis, unspecified: Secondary | ICD-10-CM

## 2017-11-22 DIAGNOSIS — J439 Emphysema, unspecified: Secondary | ICD-10-CM | POA: Insufficient documentation

## 2017-11-22 DIAGNOSIS — J84112 Idiopathic pulmonary fibrosis: Secondary | ICD-10-CM

## 2017-11-25 ENCOUNTER — Encounter: Payer: Self-pay | Admitting: Pulmonary Disease

## 2017-11-25 ENCOUNTER — Ambulatory Visit (INDEPENDENT_AMBULATORY_CARE_PROVIDER_SITE_OTHER): Payer: Medicare Other | Admitting: Pulmonary Disease

## 2017-11-25 VITALS — BP 104/60 | HR 83 | Ht 68.0 in | Wt 119.0 lb

## 2017-11-25 DIAGNOSIS — Z8709 Personal history of other diseases of the respiratory system: Secondary | ICD-10-CM | POA: Diagnosis not present

## 2017-11-25 DIAGNOSIS — J8410 Pulmonary fibrosis, unspecified: Secondary | ICD-10-CM

## 2017-11-25 DIAGNOSIS — M0510 Rheumatoid lung disease with rheumatoid arthritis of unspecified site: Secondary | ICD-10-CM | POA: Diagnosis not present

## 2017-11-25 NOTE — Patient Instructions (Signed)
Continue your current therapies We will see each other every 6 months and plan on CXR and lung function tests every year Call us as needed for any respiratory symptoms including increased shortness of breath, cough, increased mucus or chest discomfort Follow-up in 6 months

## 2017-11-25 NOTE — Progress Notes (Signed)
PULMONARY OFFICE FOLLOW UP NOTE  PROBLEMS: History of prior ARDS 1999 Prior long term nitrofurantoin therapy Pulmonary fibrosis Bronchiectasis Mild COPD  PT PROFILE: 84 y.o. F with remote minimal smoking history (15 PY, quit 1982) initially evaluated by Dr Stevenson Clinch 03/2016 and subsequently by Dr Chase Caller for pulmonary fibrosis. Pt has prior history of severe ARDS (1999) and has been on chronic nitrofurantoin for recurrent UTIs. She has been tried on 2 maintenance inhalers without discernible benefit.   DATA: CT chest 03/22/16: Generalized pulmonary hyperinflation but without bullous emphysema. Widespread bronchiectasis with bronchial wall thickening consistent with inflammatory bronchitis. Areas of pulmonary fibrosis, most pronounced in the lower lobes, where the bronchiectasis is more extensive, including areas of pulmonary lung destruction HRCT chest 06/11/16: Diffuse cylindrical and varicoid bronchiectasis throughout both lungs, most severe at the lung bases. Basilar predominant fibrotic interstitial lung disease with patchy honeycombing, with mild progression in the short interval since 03/22/2016 Spirometry 06/18/16: Mild obstruction, FEV1 1.42 liters (63%), FVC 2.43 (80%) Bronchoscopy 07/05/16: Normal airway exam. BAL negative for AFB Echocardiogram 10/07/15: LVEF 60%, Grade I DD, mild MR, RVSP estimate 35 mmHg HRCT 11/04/16: again compatible with interstitial lung disease, and considered diagnostic of usual interstitial pneumonia (UIP) from an imaging standpoint. There has been no significant progression of disease compared to the recent prior examination PFT 02/10/17: no obstruction, normal TLC, moderate reduction in DLCO 6MWT 03/11/17: 345 m. No desaturation PFTs 11/22/17: No obstruction.  Lung volumes normal.  DLCO moderately reduced (55% predicted). [place] since 02/10/17  INTERVAL: Hospitalized 11/20-11/22/18 with hyponatremia, nausea, suspected pneumonia.   SUBJ: This is a  routine evaluation.  His last visit, she has been hospitalized as documented above.  She is presently back to her pulmonary baseline.  She continues to work on finding the optimal regimen for rheumatoid arthritis.  She has no new respiratory or pulmonary complaints. She denies significant cough or sputum production. She denies chest pain, hemoptysis, purulent sputum, lower extremity edema, calf tenderness.   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 - appears to be minimally progressive.  She does also have a prior history of ARDS and nitrofurantoin therapy dose of which might have contributed to pulmonary fibrosis.  She also has mild paracicatricial bronchiectasis which is asymptomatic   PLAN: Continue her current therapies She should be followed with at least annual chest x-ray and PFTs Follow-up in 6 months for routine reevaluation   [name], MD PCCM service Mobile [phone] Pager [phone] 11/25/2017 3:24 PM

## 2017-12-01 DIAGNOSIS — L560 Drug phototoxic response: Secondary | ICD-10-CM | POA: Diagnosis not present

## 2017-12-01 DIAGNOSIS — L650 Telogen effluvium: Secondary | ICD-10-CM | POA: Diagnosis not present

## 2018-01-13 ENCOUNTER — Telehealth: Payer: Self-pay | Admitting: Primary Care

## 2018-01-13 ENCOUNTER — Other Ambulatory Visit: Payer: Self-pay

## 2018-01-13 ENCOUNTER — Encounter: Payer: Self-pay | Admitting: Family Medicine

## 2018-01-13 ENCOUNTER — Ambulatory Visit (INDEPENDENT_AMBULATORY_CARE_PROVIDER_SITE_OTHER): Payer: Medicare Other | Admitting: Family Medicine

## 2018-01-13 VITALS — BP 120/60 | HR 86 | Temp 98.6°F | Ht 68.0 in | Wt 123.8 lb

## 2018-01-13 DIAGNOSIS — M26609 Unspecified temporomandibular joint disorder, unspecified side: Secondary | ICD-10-CM

## 2018-01-13 NOTE — Telephone Encounter (Signed)
I spoke with pt and she has RA; Dr Loetta Rough is not available today and on 01/12/18 began with TMJ pain and difficult to open mouth wide. Pt can drink and has eaten soup; no injury or lacerations. If pt condition worsens prior to appt pt will go to ED otherwise will see Dr Diona Browner 01/13/18 at 12 noon.

## 2018-01-13 NOTE — Telephone Encounter (Signed)
Copied from [place] [phone]. Topic: Quick Communication - See Telephone Encounter >> Jan 13, 2018  9:20 AM Cleaster Corin, NT wrote: CRM for notification. See Telephone encounter for: 01/13/18. Pt. Calling to see if she can get a same day appt. With Allie Bossier today. Let pt. Know she doesn't have anything available and I can get her in on Monday. Pt. Doesn't want to see anyone else. Pt. Would like for someone to give her a call back if something becomes available. Pt. Needs to be seen for her arthritis.

## 2018-01-13 NOTE — Patient Instructions (Addendum)
Start home TMJ exercises. See dentist to make sure not grinding teeth.. Use a mouthguard if you are.  Increase prednisone to 10 m  g daily x 1 week. Call rheumatologist next week if not improving. Temporomandibular Joint Syndrome Temporomandibular joint (TMJ) syndrome is a condition that affects the joints between your jaw and your skull. The TMJs are located near your ears and allow your jaw to open and close. These joints and the nearby muscles are involved in all movements of the jaw. People with TMJ syndrome have pain in the area of these joints and muscles. Chewing, biting, or other movements of the jaw can be difficult or painful. TMJ syndrome can be caused by various things. In many cases, the condition is mild and goes away within a few weeks. For some people, the condition can become a long-term problem. What are the causes? Possible causes of TMJ syndrome include:  Grinding your teeth or clenching your jaw. Some people do this when they are under stress.  Arthritis.  Injury to the jaw.  Head or neck injury.  Teeth or dentures that are not aligned well.  In some cases, the cause of TMJ syndrome may not be known. What are the signs or symptoms? The most common symptom is an aching pain on the side of the head in the area of the TMJ. Other symptoms may include:  Pain when moving your jaw, such as when chewing or biting.  Being unable to open your jaw all the way.  Making a clicking sound when you open your mouth.  Headache.  Earache.  Neck or shoulder pain.  How is this diagnosed? Diagnosis can usually be made based on your symptoms, your medical history, and a physical exam. Your health care provider may check the range of motion of your jaw. Imaging tests, such as X-rays or an MRI, are sometimes done. You may need to see your dentist to determine if your teeth and jaw are lined up correctly. How is this treated? TMJ syndrome often goes away on its own. If treatment is  needed, the options may include:  Eating soft foods and applying ice or heat.  Medicines to relieve pain or inflammation.  Medicines to relax the muscles.  A splint, bite plate, or mouthpiece to prevent teeth grinding or jaw clenching.  Relaxation techniques or counseling to help reduce stress.  Transcutaneous electrical nerve stimulation (TENS). This helps to relieve pain by applying an electrical current through the skin.  Acupuncture. This is sometimes helpful to relieve pain.  Jaw surgery. This is rarely needed.  Follow these instructions at home:  Take medicines only as directed by your health care provider.  Eat a soft diet if you are having trouble chewing.  Apply ice to the painful area. ? Put ice in a plastic bag. ? Place a towel between your skin and the bag. ? Leave the ice on for 20 minutes, 2-3 times a day.  Apply a warm compress to the painful area as directed.  Massage your jaw area and perform any jaw stretching exercises as recommended by your health care provider.  If you were given a mouthpiece or bite plate, wear it as directed.  Avoid foods that require a lot of chewing. Do not chew gum.  Keep all follow-up visits as directed by your health care provider. This is important. Contact a health care provider if:  You are having trouble eating.  You have new or worsening symptoms. Get help right away if:  Your jaw locks open or closed. This information is not intended to replace advice given to you by your health care provider. Make sure you discuss any questions you have with your health care provider. Document Released: 06/15/2001 Document Revised: 05/20/2016 Document Reviewed: 04/25/2014 Elsevier Interactive Patient Education  [employer].

## 2018-01-13 NOTE — Progress Notes (Signed)
Subjective:    Patient ID: Crystal Gregory, female    DOB: 1932/07/22, 84 y.o.   MRN: [id number]  HPI   84 year old female with RA  (on plaquenil, prednisone), pulmonary fibrosis, ILD,  presents with new onset  Jaw pain.. She reports it is painful to open her mouth.  1 week ago started with clicking in right jaw at TMJ. Resolved. In last  24 hours days.. Severe pain now in left jaw at jaw line..  Pain with opening jaw as well as swelling.  No pain in ear.  No ST, no cold symptoms.  No fever.   Not chewing much.   No new medicines.  Using prednisone 5 mg daily in last weeks  Has chronic shoulder stiffness.   Rheumatology: Dr. Annalee Genta CCP positive, seropositive non erosive rheumatoid arthritis. Medical history of interstitial lung disease diagnosed ,2017 bronchoscopybiopsy proven, IPFpossible sequelae of severe ARDS 1999 and long-term use of nitrofurantoin rheumatoid lung disease, mild pulmonary fibrosis, FU withDr. [name]   On prolia.. No longer on fosamax.  Blood pressure 120/60, pulse 86, temperature 98.6 F (37 C), temperature source Oral, height 5\' 8"  (1.727 m), weight 123 lb 12 oz (56.1 kg).  Review of Systems  Constitutional: Negative for fatigue and fever.  HENT: Negative for congestion.   Eyes: Negative for pain.  Respiratory: Negative for cough and shortness of breath.   Cardiovascular: Negative for chest pain, palpitations and leg swelling.  Gastrointestinal: Negative for abdominal pain.  Genitourinary: Negative for dysuria and vaginal bleeding.  Musculoskeletal: Positive for arthralgias. Negative for back pain.  Neurological: Negative for syncope, light-headedness and headaches.  Psychiatric/Behavioral: Negative for dysphoric mood.       Objective:   Physical Exam  Constitutional: Vital signs are normal. She appears well-developed and well-nourished. She is cooperative.  Non-toxic appearance. She does not appear ill. No distress.  HENT:    Head: Normocephalic.  Right Ear: Hearing, tympanic membrane, external ear and ear canal normal. Tympanic membrane is not erythematous, not retracted and not bulging.  Left Ear: Hearing, tympanic membrane, external ear and ear canal normal. Tympanic membrane is not erythematous, not retracted and not bulging.  Nose: No mucosal edema or rhinorrhea. Right sinus exhibits no maxillary sinus tenderness and no frontal sinus tenderness. Left sinus exhibits no maxillary sinus tenderness and no frontal sinus tenderness.  Mouth/Throat: Uvula is midline, oropharynx is clear and moist and mucous membranes are normal. Normal dentition. No dental abscesses or dental caries. No posterior oropharyngeal edema or posterior oropharyngeal erythema.  ttp over right tmj and clicking when opening  Eyes: Pupils are equal, round, and reactive to light. Conjunctivae, EOM and lids are normal. Lids are everted and swept, no foreign bodies found.  Neck: Trachea normal and normal range of motion. Neck supple. Carotid bruit is not present. No thyroid mass and no thyromegaly present.  Cardiovascular: Normal rate, regular rhythm, S1 normal, S2 normal, normal heart sounds, intact distal pulses and normal pulses. Exam reveals no gallop and no friction rub.  No murmur heard. Pulmonary/Chest: Effort normal and breath sounds normal. No tachypnea. No respiratory distress. She has no decreased breath sounds. She has no wheezes. She has no rhonchi. She has no rales.  Abdominal: Soft. Normal appearance and bowel sounds are normal. There is no tenderness.  Neurological: She is alert.  Skin: Skin is warm, dry and intact. No rash noted.  Psychiatric: Her speech is normal and behavior is normal. Judgment and thought content normal. Her mood appears  not anxious. Cognition and memory are normal. She does not exhibit a depressed mood.          Assessment & Plan:

## 2018-01-17 DIAGNOSIS — M059 Rheumatoid arthritis with rheumatoid factor, unspecified: Secondary | ICD-10-CM | POA: Diagnosis not present

## 2018-01-17 DIAGNOSIS — Z79899 Other long term (current) drug therapy: Secondary | ICD-10-CM | POA: Diagnosis not present

## 2018-01-17 DIAGNOSIS — J849 Interstitial pulmonary disease, unspecified: Secondary | ICD-10-CM | POA: Diagnosis not present

## 2018-01-17 DIAGNOSIS — M1711 Unilateral primary osteoarthritis, right knee: Secondary | ICD-10-CM | POA: Diagnosis not present

## 2018-01-26 ENCOUNTER — Telehealth: Payer: Self-pay | Admitting: *Deleted

## 2018-01-26 NOTE — Telephone Encounter (Signed)
Information has been submitted to pts insurance for verification of benefits. Awaiting response for coverage  

## 2018-02-10 ENCOUNTER — Other Ambulatory Visit (INDEPENDENT_AMBULATORY_CARE_PROVIDER_SITE_OTHER): Payer: Medicare Other

## 2018-02-10 DIAGNOSIS — M810 Age-related osteoporosis without current pathological fracture: Secondary | ICD-10-CM

## 2018-02-10 LAB — CALCIUM: Calcium: 9.6 mg/dL (ref 8.4–10.5)

## 2018-02-10 NOTE — Telephone Encounter (Signed)
Verification of benefits have been processed and an approval has been received for pts prolia injection. Pts estimated cost are appx $0. This is only an estimate and cannot be confirmed until benefits are paid. Please advise pt and schedule if needed. If scheduled, once the injection is received, pls contact me back with the date it was received so that I am able to update prolia folder. thanks  

## 2018-02-10 NOTE — Telephone Encounter (Signed)
Spoke to pt and advised. Pt is agreeable and Ca lab and prolia scheduled

## 2018-02-28 DIAGNOSIS — J84112 Idiopathic pulmonary fibrosis: Secondary | ICD-10-CM | POA: Diagnosis not present

## 2018-02-28 DIAGNOSIS — M059 Rheumatoid arthritis with rheumatoid factor, unspecified: Secondary | ICD-10-CM | POA: Diagnosis not present

## 2018-02-28 DIAGNOSIS — M25475 Effusion, left foot: Secondary | ICD-10-CM | POA: Diagnosis not present

## 2018-02-28 DIAGNOSIS — M7552 Bursitis of left shoulder: Secondary | ICD-10-CM | POA: Diagnosis not present

## 2018-02-28 DIAGNOSIS — M19049 Primary osteoarthritis, unspecified hand: Secondary | ICD-10-CM | POA: Diagnosis not present

## 2018-02-28 DIAGNOSIS — J849 Interstitial pulmonary disease, unspecified: Secondary | ICD-10-CM | POA: Diagnosis not present

## 2018-02-28 DIAGNOSIS — Z79899 Other long term (current) drug therapy: Secondary | ICD-10-CM | POA: Diagnosis not present

## 2018-03-01 DIAGNOSIS — Z79899 Other long term (current) drug therapy: Secondary | ICD-10-CM | POA: Diagnosis not present

## 2018-03-01 DIAGNOSIS — M25475 Effusion, left foot: Secondary | ICD-10-CM | POA: Diagnosis not present

## 2018-03-01 DIAGNOSIS — M059 Rheumatoid arthritis with rheumatoid factor, unspecified: Secondary | ICD-10-CM | POA: Diagnosis not present

## 2018-03-02 ENCOUNTER — Ambulatory Visit (INDEPENDENT_AMBULATORY_CARE_PROVIDER_SITE_OTHER): Payer: Medicare Other | Admitting: *Deleted

## 2018-03-02 DIAGNOSIS — M810 Age-related osteoporosis without current pathological fracture: Secondary | ICD-10-CM

## 2018-03-02 DIAGNOSIS — M26609 Unspecified temporomandibular joint disorder, unspecified side: Secondary | ICD-10-CM | POA: Insufficient documentation

## 2018-03-02 MED ORDER — DENOSUMAB 60 MG/ML ~~LOC~~ SOSY
60.0000 mg | PREFILLED_SYRINGE | Freq: Once | SUBCUTANEOUS | Status: AC
  Administered 2018-03-02: 60 mg via SUBCUTANEOUS

## 2018-03-02 NOTE — Assessment & Plan Note (Signed)
Start home TMJ exercises. See dentist to make sure not grinding teeth.. Use a mouthguard if you are.  Increase prednisone to 10 m  g daily x 1 week. Call rheumatologist next week if not improving.

## 2018-03-02 NOTE — Progress Notes (Signed)
Per orders of Dory Larsen, NP, injection of prolia given by Modena Nunnery. Patient tolerated injection well.

## 2018-03-06 ENCOUNTER — Encounter: Payer: Self-pay | Admitting: Primary Care

## 2018-03-06 ENCOUNTER — Ambulatory Visit (INDEPENDENT_AMBULATORY_CARE_PROVIDER_SITE_OTHER): Payer: Medicare Other | Admitting: Primary Care

## 2018-03-06 DIAGNOSIS — M069 Rheumatoid arthritis, unspecified: Secondary | ICD-10-CM

## 2018-03-06 DIAGNOSIS — R600 Localized edema: Secondary | ICD-10-CM | POA: Diagnosis not present

## 2018-03-06 NOTE — Patient Instructions (Signed)
Please consider physical therapy for joint pain as discussed.  You may want to consider increasing your dose of hydrocodone daily for the next 3 days, then as needed.   Follow up with your rheumatologist as scheduled.  It was a pleasure to see you today!

## 2018-03-06 NOTE — Progress Notes (Signed)
Subjective:    Patient ID: Crystal Gregory, female    DOB: 1932/09/20, 84 y.o.   MRN: [id number]  HPI  Crystal Gregory is an 84 year old female with a history of rheumatoid arthritis, tobacco abuse (quit in 1982) who presents today with a chief complaint of upper and lower extremity pain and swelling.  Last week she noticed significant swelling to her left ankle and foot so she went to her Rheumatologist's office who recommended evaluation with her PCP. The week prior she was on vacation at the beach, ate out nearly every meal, and rode 4 hours in a vehicle home. The swelling began when she came home from the beach, lasted 5 days and then dissipated. She elevates her extremities and wears compression socks. She did not take anything. She presented to her rheumatologist who observed the moderate swelling with pitting and suggested PRN Lasix.    Her main concern is chronic, intermittent from various joints. Her recent pain is located to the right shoulder and knee. She describes her pain as sharp, shooting that will radiate from her right shoulder down to her right upper extremity and around to her thoracic back. She'll also experience right knee pain with radiation to her mid upper thigh.   She is currently managed on hydrocodone as needed for which is prescribed by her Rheumatologist. Plaquenil was discontinued due to side effects and she is now managed on Imuran 50 mg, also taking Prednisone 5 mg daily.  She is having difficulty cooking, can no longer play the piano, and is experiencing more prolonged pain. She took one of her husbands Oxycodone pills and experienced relief.   Review of Systems  Constitutional: Negative for unexpected weight change.  Cardiovascular: Positive for leg swelling.  Musculoskeletal: Positive for arthralgias.  Skin: Negative for color change.       Past Medical History:  Diagnosis Date  . Allergy   . GERD (gastroesophageal reflux disease)   . Septicemia ([place]) 1999   spent 4 weeks in ICU, intubated -? PNA     Social History   Socioeconomic History  . Marital status: Married    Spouse name: Not on file  . Number of children: 2  . Years of education: Not on file  . Highest education level: Not on file  Occupational History  . Occupation: Retired   Scientific laboratory technician  . Financial resource strain: Not on file  . Food insecurity:    Worry: Not on file    Inability: Not on file  . Transportation needs:    Medical: Not on file    Non-medical: Not on file  Tobacco Use  . Smoking status: Former Smoker    Packs/day: 0.50    Years: 30.00    Pack years: 15.00    Last attempt to quit: 01/16/1981    Years since quitting: 37.1  . Smokeless tobacco: Never Used  Substance and Sexual Activity  . Alcohol use: Yes    Comment: 2 drinks daily   . Drug use: No  . Sexual activity: Not on file  Lifestyle  . Physical activity:    Days per week: Not on file    Minutes per session: Not on file  . Stress: Not on file  Relationships  . Social connections:    Talks on phone: Not on file    Gets together: Not on file    Attends religious service: Not on file    Active member of club or organization: Not on file  Attends meetings of clubs or organizations: Not on file    Relationship status: Not on file  . Intimate partner violence:    Fear of current or ex partner: Not on file    Emotionally abused: Not on file    Physically abused: Not on file    Forced sexual activity: Not on file  Other Topics Concern  . Not on file  Social History Narrative   Married.     2 children.  Lives with her husband in [place].    Past Surgical History:  Procedure Laterality Date  . ABDOMINAL HYSTERECTOMY    . FLEXIBLE BRONCHOSCOPY N/A 07/05/2016   Procedure: FLEXIBLE BRONCHOSCOPY;  Surgeon: Vilinda Boehringer, MD;  Location: [facility] ORS;  Service: Cardiopulmonary;  Laterality: N/A;    Family History  Problem Relation Age of Onset  . Parkinson's disease Mother   . Colon  polyps Mother 32          . Breast cancer Sister     No Known Allergies  Current Outpatient Medications on File Prior to Visit  Medication Sig Dispense Refill  . azaTHIOprine (IMURAN) 50 MG tablet Please give 50 mg tablets it will be gradually increased 2 tabs daily, 90 days    . denosumab (PROLIA) 60 MG/ML SOLN injection Inject 60 mg into the skin every 6 (six) months. Administer in upper arm, thigh, or abdomen    . HYDROcodone-acetaminophen (NORCO/VICODIN) 5-325 MG tablet May take 1- 2 TABLETS  every 8 hours, if more pain control is needed for five days THIRTY TABLETS    . pantoprazole (PROTONIX) 20 MG tablet Take 1 tablet (20 mg total) by mouth daily. 90 tablet 3  . predniSONE (DELTASONE) 5 MG tablet Take 5 mg by mouth daily.    . propranolol (INDERAL) 10 MG tablet TAKE 1 TABLET TWICE A DAY 180 tablet 3  . traZODone (DESYREL) 50 MG tablet TAKE 1 TABLET AT BEDTIME 90 tablet 3   No current facility-administered medications on file prior to visit.     BP 106/66   Pulse 80   Temp 98.3 F (36.8 C) (Oral)   Ht 5\' 8"  (1.727 m)   Wt 124 lb (56.2 kg)   SpO2 99%   BMI 18.85 kg/m    Objective:   Physical Exam  Constitutional: She appears well-nourished.  Neck: Neck supple.  Cardiovascular: Normal rate and regular rhythm.  Pulses:      Dorsalis pedis pulses are 2+ on the right side, and 2+ on the left side.       Posterior tibial pulses are 2+ on the right side, and 2+ on the left side.  No lower extremity edema on exam today  Respiratory: Effort normal and breath sounds normal.  Skin: Skin is warm and dry.  Psychiatric: She has a normal mood and affect.           Assessment & Plan:

## 2018-03-06 NOTE — Assessment & Plan Note (Signed)
Moderate edema last week per patient, also witnessed by rheumatologist. Exam today benign.  Will refrain from PO furosemide or other diuretic treatment at this point given that this was one event and resolved spontaneously. If symptoms do begin to reoccur then would recommend echocardiogram and further work up. Consider PRN furosemide if there is a clinical need.   Continue elevation, discussed use of compression socks/hose. She will update if symptoms return.

## 2018-03-06 NOTE — Assessment & Plan Note (Signed)
Chronic pain secondary to rheumatoid arthritis, managed on Norco PRN per Rheumatologist.  Discussed that I do not prescribe oxycodone for pain management.  Recommended she increase her Norco to daily use x 3 days for pain control, then back down to PRN use. Cannot take NSAID's as she's on prednisone chronically. Continue Imuran 100 mg as prescribed.  Discussed options for further treatment including physical therapy vs pain management referral, she would like to discuss this with her Rheumatologist. Continue Norco, discussed to update regarding current flare within a few days.

## 2018-04-10 DIAGNOSIS — Z79899 Other long term (current) drug therapy: Secondary | ICD-10-CM | POA: Diagnosis not present

## 2018-04-10 DIAGNOSIS — M059 Rheumatoid arthritis with rheumatoid factor, unspecified: Secondary | ICD-10-CM | POA: Diagnosis not present

## 2018-05-04 ENCOUNTER — Telehealth: Payer: Self-pay | Admitting: Pulmonary Disease

## 2018-05-04 NOTE — Telephone Encounter (Signed)
Pt.notified

## 2018-05-04 NOTE — Telephone Encounter (Signed)
We do not need to get those until Feb 2020  Thanks  Waunita Schooner

## 2018-05-04 NOTE — Telephone Encounter (Signed)
Pt has an appt on 8/19, she asks if she needs CXR and PFT. Please call and advise.

## 2018-05-22 ENCOUNTER — Encounter: Payer: Self-pay | Admitting: Pulmonary Disease

## 2018-05-22 ENCOUNTER — Ambulatory Visit (INDEPENDENT_AMBULATORY_CARE_PROVIDER_SITE_OTHER): Payer: Medicare Other | Admitting: Pulmonary Disease

## 2018-05-22 VITALS — BP 110/68 | HR 79 | Resp 16 | Ht 68.0 in | Wt 123.0 lb

## 2018-05-22 DIAGNOSIS — R059 Cough, unspecified: Secondary | ICD-10-CM

## 2018-05-22 DIAGNOSIS — R05 Cough: Secondary | ICD-10-CM | POA: Diagnosis not present

## 2018-05-22 DIAGNOSIS — J8410 Pulmonary fibrosis, unspecified: Secondary | ICD-10-CM | POA: Diagnosis not present

## 2018-05-22 NOTE — Progress Notes (Signed)
PULMONARY OFFICE FOLLOW UP NOTE  PROBLEMS: History of prior ARDS 1999 Prior long term nitrofurantoin therapy Pulmonary fibrosis Bronchiectasis Mild COPD  PT PROFILE: 84 y.o. F with remote minimal smoking history (15 PY, quit 1982) initially evaluated by Dr Stevenson Clinch 03/2016 and subsequently by Dr Chase Caller for pulmonary fibrosis. Pt has prior history of severe ARDS (1999) and has been on chronic nitrofurantoin for recurrent UTIs. She has been tried on 2 maintenance inhalers without discernible benefit.   DATA: CT chest 03/22/16: Generalized pulmonary hyperinflation but without bullous emphysema. Widespread bronchiectasis with bronchial wall thickening consistent with inflammatory bronchitis. Areas of pulmonary fibrosis, most pronounced in the lower lobes, where the bronchiectasis is more extensive, including areas of pulmonary lung destruction HRCT chest 06/11/16: Diffuse cylindrical and varicoid bronchiectasis throughout both lungs, most severe at the lung bases. Basilar predominant fibrotic interstitial lung disease with patchy honeycombing, with mild progression in the short interval since 03/22/2016 Spirometry 06/18/16: Mild obstruction, FEV1 1.42 liters (63%), FVC 2.43 (80%) Bronchoscopy 07/05/16: Normal airway exam. BAL negative for AFB Echocardiogram 10/07/15: LVEF 60%, Grade I DD, mild MR, RVSP estimate 35 mmHg HRCT 11/04/16: again compatible with interstitial lung disease, and considered diagnostic of usual interstitial pneumonia (UIP) from an imaging standpoint. There has been no significant progression of disease compared to the recent prior examination PFT 02/10/17: no obstruction, normal TLC, moderate reduction in DLCO 6MWT 03/11/17: 345 m. No desaturation PFTs 11/22/17: No obstruction.  Lung volumes normal.  DLCO moderately reduced (55% predicted). [place] since 02/10/17  INTERVAL: No major pulmonary events.  Last seen 11/25/2017.     SUBJ: This is a scheduled follow-up. Since  last visit, has been started on azathioprine for rheumatoid arthritis.  Remains on prednisone 5 mg daily for rheumatoid arthritis.  From a respiratory point review she is doing very well.  She has little or no respiratory limitation to her activity.  She does report chronic, mild cough with scant mucus production.  Her cough is worse in a supine position at night. Her symptoms of rheumatoid arthritis are finally under control with the medication changes documented above.  She denies CP, fever, purulent sputum, hemoptysis, LE edema and calf tenderness.  OBJ: Vitals:   05/22/18 0917 05/22/18 0920  BP:  110/68  Pulse:  79  Resp: 16   SpO2:  97%  Weight: 123 lb (55.8 kg)   Height: 5\' 8"  (1.727 m)   RA  Gen: NAD HEENT: NCAT, sclera white Neck: No JVD Lungs: Breath sounds mildly diminished, LLL crackles Cardiovascular: RRR, no murmurs Abdomen: Soft, nontender, normal BS Ext: without clubbing, cyanosis, edema Neuro: grossly intact Skin: Limited exam, no lesions noted    DATA: CXR: No new film  IMPRESSION:   ICD-10-CM   1. Pulmonary fibrosis ([place]) J84.10 Pulmonary Function Test [facility] Only    DG Chest 2 View  2. Cough R05    Pulmonary fibrosis has a UIP pattern on CT chest likely due to rheumatoid lung disease. She does also have a prior history of ARDS and nitrofurantoin therapy dose of which might have contributed to pulmonary fibrosis.    Cough likely due to GERD  PLAN: I recommended that she double pantoprazole to 40 mg daily to be taken after dinner  She will follow-up in 6 months with repeat PFTs and CXR at that time   [name], MD PCCM service Mobile [phone] Pager [phone] 05/22/2018 3:36 PM

## 2018-05-22 NOTE — Patient Instructions (Signed)
You may try doubling up pantoprazole (Protonix) to 40 mg prior to bedtime and see if this benefits your nocturnal cough  Follow-up in 6 months with lung function tests and chest x-ray prior to that visit

## 2018-05-23 DIAGNOSIS — J849 Interstitial pulmonary disease, unspecified: Secondary | ICD-10-CM | POA: Diagnosis not present

## 2018-05-23 DIAGNOSIS — Z79899 Other long term (current) drug therapy: Secondary | ICD-10-CM | POA: Diagnosis not present

## 2018-05-23 DIAGNOSIS — M059 Rheumatoid arthritis with rheumatoid factor, unspecified: Secondary | ICD-10-CM | POA: Diagnosis not present

## 2018-06-14 ENCOUNTER — Telehealth: Payer: Self-pay | Admitting: Internal Medicine

## 2018-06-14 NOTE — Telephone Encounter (Signed)
Crystal Gregory  Just following cases I had seen before. Noticed interim dx of RA. If she does another PFT and there is decline - she might qualify for a non-IPF registry study run out of [facility] and sponsored by [facility]; we are a site. 6 month blood draw and qeustionnaires with $50/visit compensaion. Just thought I will share with you  Thanks    SIGNATURE    Dr. Brand Males, M.D., F.C.C.P,  Pulmonary and Critical Care Medicine Staff Physician, [place] Director - Interstitial Lung Disease  Program  Pulmonary [place] [place], [place], [address]  Pager: [phone], If no answer or between  15:00h - 7:00h: call [phone] Telephone: [phone]  1:40 PM 06/14/2018

## 2018-06-14 NOTE — Telephone Encounter (Signed)
OK with me if you want to contact her for possible enrollment. She has minimal physiological impairment so I don't think I would advise her (at 75 yrs of age) to enroll in a trial that involved experimental drug therapy but if it is a trial to simply monitor course of illness etc, I'm good with that.  Crystal Gregory

## 2018-06-15 NOTE — Telephone Encounter (Signed)
Yeah this is just monitoring but only for those who have declined. So, she will need a standard of care breathing test and/or HRCT chest - if you feel they are indicated. No rush. You can do it at next visit provided is by Jan 2020  Thanks for your support

## 2018-06-19 ENCOUNTER — Ambulatory Visit (INDEPENDENT_AMBULATORY_CARE_PROVIDER_SITE_OTHER): Payer: Medicare Other | Admitting: Primary Care

## 2018-06-19 ENCOUNTER — Encounter: Payer: Self-pay | Admitting: Primary Care

## 2018-06-19 VITALS — BP 120/70 | HR 71 | Temp 98.0°F | Ht 68.0 in | Wt 123.2 lb

## 2018-06-19 DIAGNOSIS — J84112 Idiopathic pulmonary fibrosis: Secondary | ICD-10-CM | POA: Diagnosis not present

## 2018-06-19 DIAGNOSIS — K219 Gastro-esophageal reflux disease without esophagitis: Secondary | ICD-10-CM

## 2018-06-19 DIAGNOSIS — M069 Rheumatoid arthritis, unspecified: Secondary | ICD-10-CM | POA: Diagnosis not present

## 2018-06-19 DIAGNOSIS — Z23 Encounter for immunization: Secondary | ICD-10-CM | POA: Diagnosis not present

## 2018-06-19 DIAGNOSIS — M810 Age-related osteoporosis without current pathological fracture: Secondary | ICD-10-CM

## 2018-06-19 MED ORDER — OMEPRAZOLE 20 MG PO CPDR: 20.0000 mg | DELAYED_RELEASE_CAPSULE | Freq: Every day | ORAL | 3 refills | Status: DC

## 2018-06-19 NOTE — Assessment & Plan Note (Signed)
Increased symptoms recently with recurring cough that would wake her at night. This has significantly improved since addition of omeprazole 20 mg. Will continue both PPI's as this has been most beneficial. Rx for omeprazole 20 mg sent to pharmacy.

## 2018-06-19 NOTE — Assessment & Plan Note (Signed)
Compliant to Prolia injections.

## 2018-06-19 NOTE — Assessment & Plan Note (Signed)
Due for repeat pneumonia vaccination, pneumovax 23 provided today. Influenza vaccination provided today.

## 2018-06-19 NOTE — Assessment & Plan Note (Signed)
Will be weaning off of prednisone slowly over the months. Agree. Following with rheumatology.

## 2018-06-19 NOTE — Progress Notes (Signed)
Subjective:    Patient ID: Crystal Gregory, female    DOB: [birth year], 84 y.o.   MRN: [id number]  HPI  Crystal Gregory is an 84 year old female who presents today to discuss questions regarding medications and vaccinations.  1) ILD/IPF: Currently following with pulmonology for whom she saw recently for chronic, recurrent cough. She was thought to have uncontrolled, silent GERD. She was told to increase the dose of her PPI so she is taking pantoprazole 20 mg and omeprazole 20 mg HS and has noticed significant improvement. She would like a prescription for omeprazole 20 mg sent to her pharmacy.   Her last pneumonia vaccination was in 2013, Prevnar 13. She is due for her influenza vaccination.  2) Rheumatoid Arthritis: Chronic, mostly with symptoms to her bilateral hands and fingers. She'll experience pain with stiffness when preparing light meals, playing piano/organ. She is managed on prednisone 5 mg daily and is following with rheumatology. The plan is to wean her off of the prednisone by one milligram at a time over the next several months.   Review of Systems  Respiratory: Negative for shortness of breath and wheezing.        Recurrent cough improved after dose increase of PPI  Cardiovascular: Negative for chest pain.  Musculoskeletal: Positive for arthralgias.   epicact:MR_REPORT_VIEWER_MODAL_RESIZABLE,RunParamsURLEncoded:MR_REPORTS%7C%7C%07%2F8%[id number]%5E8%5E%[id number]    Past Medical History:  Diagnosis Date  . Allergy   . GERD (gastroesophageal reflux disease)   . Septicemia ([place]) 1999   spent 4 weeks in ICU, intubated -? PNA     Social History   Socioeconomic History  . Marital status: Married    Spouse name: Not on file  . Number of children: 2  . Years of education: Not on file  . Highest education level: Not on file  Occupational History  . Occupation: Retired   Scientific laboratory technician  . Financial resource strain: Not on file  . Food insecurity:   Worry: Not on file    Inability: Not on file  . Transportation needs:    Medical: Not on file    Non-medical: Not on file  Tobacco Use  . Smoking status: Former Smoker    Packs/day: 0.50    Years: 30.00    Pack years: 15.00    Last attempt to quit: 01/16/1981    Years since quitting: 37.4  . Smokeless tobacco: Never Used  Substance and Sexual Activity  . Alcohol use: Yes    Comment: 2 drinks daily   . Drug use: No  . Sexual activity: Not on file  Lifestyle  . Physical activity:    Days per week: Not on file    Minutes per session: Not on file  . Stress: Not on file  Relationships  . Social connections:    Talks on phone: Not on file    Gets together: Not on file    Attends religious service: Not on file    Active member of club or organization: Not on file    Attends meetings of clubs or organizations: Not on file    Relationship status: Not on file  . Intimate partner violence:    Fear of current or ex partner: Not on file    Emotionally abused: Not on file    Physically abused: Not on file    Forced sexual activity: Not on file  Other Topics Concern  . Not on file  Social History Narrative   Married.     2 children.  Lives with her husband in  [place].    Past Surgical History:  Procedure Laterality Date  . ABDOMINAL HYSTERECTOMY    . FLEXIBLE BRONCHOSCOPY N/A 07/05/2016   Procedure: FLEXIBLE BRONCHOSCOPY;  Surgeon: Vilinda Boehringer, MD;  Location: [facility] ORS;  Service: Cardiopulmonary;  Laterality: N/A;    Family History  Problem Relation Age of Onset  . Parkinson's disease Mother   . Colon polyps Mother 55          . Breast cancer Sister     No Known Allergies  Current Outpatient Medications on File Prior to Visit  Medication Sig Dispense Refill  . azaTHIOprine (IMURAN) 50 MG tablet 1 TAB TWICE A DAY , 90 DAYS    . denosumab (PROLIA) 60 MG/ML SOLN injection Inject 60 mg into the skin every 6 (six) months. Administer in upper arm, thigh, or abdomen    .  pantoprazole (PROTONIX) 20 MG tablet Take 1 tablet (20 mg total) by mouth daily. 90 tablet 3  . predniSONE (DELTASONE) 5 MG tablet Take 5 mg by mouth daily.    . propranolol (INDERAL) 10 MG tablet TAKE 1 TABLET TWICE A DAY 180 tablet 3  . traZODone (DESYREL) 50 MG tablet TAKE 1 TABLET AT BEDTIME 90 tablet 3   No current facility-administered medications on file prior to visit.     BP 120/70   Pulse 71   Temp 98 F (36.7 C) (Oral)   Ht 5\' 8"  (1.727 m)   Wt 123 lb 4 oz (55.9 kg)   SpO2 98%   BMI 18.74 kg/m    Objective:   Physical Exam  Constitutional: She appears well-nourished.  Neck: Neck supple.  Cardiovascular: Normal rate and regular rhythm.  Respiratory: Effort normal and breath sounds normal.  Skin: Skin is warm and dry.  Psychiatric: She has a normal mood and affect.           Assessment & Plan:

## 2018-06-19 NOTE — Addendum Note (Signed)
Addended by: Jacqualin Combes on: 06/19/2018 01:01 PM   Modules accepted: Orders

## 2018-06-19 NOTE — Patient Instructions (Signed)
Continue pantoprazole 20 mg and omeprazole 20 mg daily for heartburn.  You were provided with  pneumonia and influenza vaccinations today.  It was a pleasure to see you today!

## 2018-07-18 DIAGNOSIS — G250 Essential tremor: Secondary | ICD-10-CM

## 2018-07-18 DIAGNOSIS — G4700 Insomnia, unspecified: Secondary | ICD-10-CM

## 2018-07-18 DIAGNOSIS — K219 Gastro-esophageal reflux disease without esophagitis: Secondary | ICD-10-CM

## 2018-07-18 NOTE — Telephone Encounter (Signed)
Rx have not bee prescribed by Allie Bossier. Last office visit on 06/19/2018

## 2018-07-19 MED ORDER — PANTOPRAZOLE SODIUM 20 MG PO TBEC: DELAYED_RELEASE_TABLET | ORAL | 3 refills | Status: DC

## 2018-07-19 MED ORDER — TRAZODONE HCL 50 MG PO TABS: ORAL_TABLET | ORAL | 3 refills | Status: DC

## 2018-07-20 MED ORDER — PROPRANOLOL HCL 10 MG PO TABS
ORAL_TABLET | ORAL | 3 refills | Status: DC
Start: 2018-07-20 — End: 2019-09-26

## 2018-07-21 ENCOUNTER — Telehealth: Payer: Self-pay | Admitting: *Deleted

## 2018-07-21 NOTE — Telephone Encounter (Signed)
Information has been submitted to pts insurance for verification of benefits. Awaiting response for coverage  

## 2018-07-25 DIAGNOSIS — M059 Rheumatoid arthritis with rheumatoid factor, unspecified: Secondary | ICD-10-CM | POA: Diagnosis not present

## 2018-07-25 DIAGNOSIS — Z79899 Other long term (current) drug therapy: Secondary | ICD-10-CM | POA: Diagnosis not present

## 2018-08-22 DIAGNOSIS — M059 Rheumatoid arthritis with rheumatoid factor, unspecified: Secondary | ICD-10-CM | POA: Diagnosis not present

## 2018-08-22 DIAGNOSIS — J849 Interstitial pulmonary disease, unspecified: Secondary | ICD-10-CM | POA: Diagnosis not present

## 2018-08-22 DIAGNOSIS — Z79899 Other long term (current) drug therapy: Secondary | ICD-10-CM | POA: Diagnosis not present

## 2018-08-22 DIAGNOSIS — M7551 Bursitis of right shoulder: Secondary | ICD-10-CM | POA: Diagnosis not present

## 2018-08-25 ENCOUNTER — Emergency Department: Payer: Medicare Other

## 2018-08-25 ENCOUNTER — Emergency Department
Admission: EM | Admit: 2018-08-25 | Discharge: 2018-08-25 | Disposition: A | Discharge status: Home / Self Care | Payer: Medicare Other | Attending: Emergency Medicine | Admitting: Emergency Medicine

## 2018-08-25 ENCOUNTER — Other Ambulatory Visit: Payer: Self-pay

## 2018-08-25 ENCOUNTER — Encounter: Payer: Self-pay | Admitting: Emergency Medicine

## 2018-08-25 DIAGNOSIS — Z79899 Other long term (current) drug therapy: Secondary | ICD-10-CM | POA: Insufficient documentation

## 2018-08-25 DIAGNOSIS — Z87891 Personal history of nicotine dependence: Secondary | ICD-10-CM | POA: Insufficient documentation

## 2018-08-25 DIAGNOSIS — R110 Nausea: Secondary | ICD-10-CM | POA: Diagnosis not present

## 2018-08-25 DIAGNOSIS — R42 Dizziness and giddiness: Secondary | ICD-10-CM | POA: Diagnosis not present

## 2018-08-25 DIAGNOSIS — R0602 Shortness of breath: Secondary | ICD-10-CM | POA: Diagnosis not present

## 2018-08-25 DIAGNOSIS — Z8709 Personal history of other diseases of the respiratory system: Secondary | ICD-10-CM | POA: Diagnosis not present

## 2018-08-25 DIAGNOSIS — R079 Chest pain, unspecified: Secondary | ICD-10-CM | POA: Diagnosis not present

## 2018-08-25 HISTORY — DX: Interstitial pulmonary disease, unspecified: J84.9

## 2018-08-25 HISTORY — DX: Pulmonary fibrosis, unspecified: J84.10

## 2018-08-25 LAB — CBC
HCT: 34.2 % — ABNORMAL LOW (ref 36.0–46.0)
HEMOGLOBIN: 11.7 g/dL — AB (ref 12.0–15.0)
MCH: 36.4 pg — ABNORMAL HIGH (ref 26.0–34.0)
MCHC: 34.2 g/dL (ref 30.0–36.0)
MCV: 106.5 fL — ABNORMAL HIGH (ref 80.0–100.0)
NRBC: 0 % (ref 0.0–0.2)
Platelets: 306 10*3/uL (ref 150–400)
RBC: 3.21 MIL/uL — ABNORMAL LOW (ref 3.87–5.11)
RDW: 14.7 % (ref 11.5–15.5)
WBC: 6.8 10*3/uL (ref 4.0–10.5)

## 2018-08-25 LAB — BASIC METABOLIC PANEL
ANION GAP: 10 (ref 5–15)
BUN: 18 mg/dL (ref 8–23)
CO2: 22 mmol/L (ref 22–32)
Calcium: 8.6 mg/dL — ABNORMAL LOW (ref 8.9–10.3)
Chloride: 103 mmol/L (ref 98–111)
Creatinine, Ser: 0.59 mg/dL (ref 0.44–1.00)
GFR calc non Af Amer: >60 mL/min (ref >60)
Glucose, Bld: 121 mg/dL — ABNORMAL HIGH (ref 70–99)
POTASSIUM: 4.1 mmol/L (ref 3.5–5.1)
Sodium: 135 mmol/L (ref 135–145)

## 2018-08-25 LAB — TROPONIN I
Troponin I: <0.03 ng/mL (ref <0.03)
Troponin I: <0.03 ng/mL (ref <0.03)

## 2018-08-25 MED ORDER — ASPIRIN 81 MG PO CHEW
324.0000 mg | CHEWABLE_TABLET | Freq: Once | ORAL | Status: DC
  Filled 2018-08-25: qty 4

## 2018-08-25 MED ORDER — ONDANSETRON HCL 4 MG/2ML IJ SOLN
4.0000 mg | Freq: Once | INTRAMUSCULAR | Status: AC
  Administered 2018-08-25: 4 mg via INTRAVENOUS
  Filled 2018-08-25: qty 2

## 2018-08-25 NOTE — ED Triage Notes (Signed)
[facility]:  C/O sob and CP since 1615 today.  ASA 324 mg taken PTA and 2 sl NTG taken.  Patient has history of pulmonary fibrosis and RA.  Taking a steroid taper currently for RA flare (day 3).  18 ga SL LAC.  CBG:  161

## 2018-08-25 NOTE — Discharge Instructions (Signed)
I really think it would be safer if you stayed in the hospital tonight but since she will not please call your cardiologist Dr. Rockey Situ first thing Monday.  Let him know you are having chest pain he should be out of see you in the office on Monday.  Take a regular aspirin every day until then.  We will give you the first dose here in the emergency room tonight.

## 2018-08-25 NOTE — ED Provider Notes (Signed)
[facility] Emergency Department Provider Note   ____________________________________________   First MD Initiated Contact with Patient 08/25/18 1748     (approximate)  I have reviewed the triage vital signs and the nursing notes.   HISTORY  Chief Complaint Chest Pain and Shortness of Breath    HPI Crystal Gregory is a 84 y.o. female who has a history of pulmonary fibrosis.  She reports she was sitting on the couch and developed pain in the jaw this went down into her chest when then she got chest pain and some increased shortness of breath felt nauseated and lightheaded.  She went to see her husband who has congestive failure and a pacemaker he thought she was having a heart attack gave her 2 of his nitros which did not help when she came in here.  Now her chest pain is feeling much better she just feels a "fullness".  Past Medical History:  Diagnosis Date  . Allergy   . GERD (gastroesophageal reflux disease)   . Interstitial lung disorders ([place])   . Pulmonary fibrosis ([place])   . Septicemia ([place]) 1999   spent 4 weeks in ICU, intubated -? PNA    Patient Active Problem List   Diagnosis Date Noted  . Lower extremity edema 03/06/2018  . TMJ (temporomandibular joint syndrome) 03/02/2018  . CAP (community acquired pneumonia) 08/23/2017  . RA (rheumatoid arthritis) ([place]) 07/13/2017  . Polyarthralgia 04/25/2017  . ILD (interstitial lung disease) ([place]) 06/18/2016  . IPF (idiopathic pulmonary fibrosis) ([place]) 03/23/2016  . Bronchiectasis without complication ([place]) 23/76/2831  . Allergic rhinitis 03/22/2016  . Anal fissure 08/18/2015  . Insomnia 08/18/2015  . Osteoporosis 02/13/2015  . Stress due to illness of family member 09/16/2014  . Hiatal hernia 10/13/2012  . GERD (gastroesophageal reflux disease)   . Allergy     Past Surgical History:  Procedure Laterality Date  . ABDOMINAL HYSTERECTOMY    . FLEXIBLE BRONCHOSCOPY N/A 07/05/2016   Procedure:  FLEXIBLE BRONCHOSCOPY;  Surgeon: Vilinda Boehringer, MD;  Location: [facility] ORS;  Service: Cardiopulmonary;  Laterality: N/A;    Prior to Admission medications   Medication Sig Start Date End Date Taking? Authorizing Provider  azaTHIOprine (IMURAN) 50 MG tablet 1 TAB TWICE A DAY , 90 DAYS 05/23/18   [provider]  denosumab (PROLIA) 60 MG/ML SOLN injection Inject 60 mg into the skin every 6 (six) months. Administer in upper arm, thigh, or abdomen    [provider]  omeprazole (PRILOSEC) 20 MG capsule Take 1 capsule (20 mg total) by mouth daily. 06/19/18   Pleas Koch, NP  pantoprazole (PROTONIX) 20 MG tablet Take 1 tablet by mouth once daily for heartburn. 07/19/18   Pleas Koch, NP  predniSONE (DELTASONE) 5 MG tablet Take 5 mg by mouth daily.    [provider]  propranolol (INDERAL) 10 MG tablet TAKE 1 TABLET TWICE A DAY for tremor. 07/20/18   Pleas Koch, NP  traZODone (DESYREL) 50 MG tablet TAKE 1 TABLET AT BEDTIME for sleep. 07/19/18   Pleas Koch, NP    Allergies Patient has no known allergies.  Family History  Problem Relation Age of Onset  . Parkinson's disease Mother   . Colon polyps Mother 19          . Breast cancer Sister     Social History Social History   Tobacco Use  . Smoking status: Former Smoker    Packs/day: 0.50    Years: 30.00  Pack years: 15.00    Last attempt to quit: 01/16/1981    Years since quitting: 37.6  . Smokeless tobacco: Never Used  Substance Use Topics  . Alcohol use: Yes    Comment: 2 drinks daily   . Drug use: No    Review of Systems  Constitutional: No fever/chills Eyes: No visual changes. ENT: No sore throat. Cardiovascular:  chest pain. Respiratory:  shortness of breath. Gastrointestinal: No abdominal pain.  No nausea, no vomiting.  No diarrhea.  No constipation. Genitourinary: Negative for dysuria. Musculoskeletal: Negative for back pain. Skin: Negative for rash. Neurological:  Negative for headaches, focal weakness   ____________________________________________   PHYSICAL EXAM:  VITAL SIGNS: ED Triage Vitals  Enc Vitals Group     BP 08/25/18 1752 (!) 166/81     Pulse --      Resp 08/25/18 1752 (!) 21     Temp 08/25/18 1752 98.1 F (36.7 C)     Temp Source 08/25/18 1752 Oral     SpO2 08/25/18 1752 98 %     Weight 08/25/18 1754 120 lb (54.4 kg)     Height 08/25/18 1754 5\' 8"  (1.727 m)     Head Circumference --      Peak Flow --      Pain Score 08/25/18 1753 3     Pain Loc --      Pain Edu? --      Excl. in [place]? --     Constitutional: Alert and oriented. Well appearing and in no acute distress. Eyes: Conjunctivae are normal.  Head: Atraumatic. Nose: No congestion/rhinnorhea. Mouth/Throat: Mucous membranes are moist.  Oropharynx non-erythematous. Neck: No stridor. Cardiovascular: Normal rate, regular rhythm. Grossly normal heart sounds.  Good peripheral circulation. Respiratory: Normal respiratory effort.  No retractions. Lungs CTAB. Gastrointestinal: Soft and nontender. No distention. No abdominal bruits. No CVA tenderness. Musculoskeletal: No lower extremity tenderness  Neurologic:  Normal speech and language. No gross focal neurologic deficits are appreciated.  Skin:  Skin is warm, dry and intact. No rash noted. Psychiatric: Mood and affect are normal. Speech and behavior are normal.  ____________________________________________   LABS (all labs ordered are listed, but only abnormal results are displayed)  Labs Reviewed  BASIC METABOLIC PANEL - Abnormal; Notable for the following components:      Result Value   Glucose, Bld 121 (*)    Calcium 8.6 (*)    All other components within normal limits  CBC - Abnormal; Notable for the following components:   RBC 3.21 (*)    Hemoglobin 11.7 (*)    HCT 34.2 (*)    MCV 106.5 (*)    MCH 36.4 (*)    All other components within normal limits  TROPONIN I  TROPONIN I    ____________________________________________  EKG EKG read interpreted by me shows normal sinus rhythm rate of 75 normal axis there is slight ST segment depression laterally and inferiorly T waves are slightly large and expect possibly mildly hyperacute.  ____________________________________________  RADIOLOGY  ED MD interpretation: Chest x-ray read by radiology reviewed by me shows no acute disease  Official radiology report(s): Dg Chest 2 View  Result Date: 08/25/2018 CLINICAL DATA:  Chest pain and shortness of breath EXAM: CHEST - 2 VIEW COMPARISON:  11/22/2017 FINDINGS: Extensive artifact from EKG leads. Chronic hyperinflation in this former smoker. Chronic coarse interstitial opacities at the bases-there is known honeycombing. Normal heart size. Negative aortic contours. No acute lung opacity or effusion. IMPRESSION: 1. Pulmonary fibrosis. 2.  Hyperinflation, suspect there is also COPD. 3. No acute finding when compared to prior Electronically Signed   By: Monte Fantasia M.D.   On: 08/25/2018 18:49    ____________________________________________   PROCEDURES  Procedure(s) performed:   Procedures  Critical Care performed:   ____________________________________________   INITIAL IMPRESSION / ASSESSMENT AND PLAN / ED COURSE  Second troponin is negative.  Her history is very suspicious however and I want her to stay however she refuses her husband also tries to talk her into staying but she also refuses to stay for him.  She understands that she could have another heart attack and perhaps not make it back here because she would be dead but she still wants to go home.  She will call Dr. Rockey Situ who is her cardiologist first thing Monday.  She will take an aspirin every day from now until then.         ____________________________________________   FINAL CLINICAL IMPRESSION(S) / ED DIAGNOSES  Final diagnoses:  Chest pain, unspecified type     ED Discharge Orders     None       Note:  This document was prepared using Dragon voice recognition software and may include unintentional dictation errors.    Nena Polio, MD 08/25/18 2108

## 2018-08-28 ENCOUNTER — Other Ambulatory Visit: Payer: Medicare Other

## 2018-08-28 NOTE — Telephone Encounter (Signed)
Verification of benefits have been processed and an approval has been received for pts prolia injection. Pts estimated cost are appx $0. This is only an estimate and cannot be confirmed until benefits are paid. Please advise pt and schedule if needed. If scheduled, once the injection is received, pls contact me back with the date it was received so that I am able to update prolia folder. thanks  

## 2018-08-28 NOTE — Telephone Encounter (Signed)
Pt is agreeable to out of pocket expenses; injection scheduled.

## 2018-08-29 ENCOUNTER — Encounter: Payer: Self-pay | Admitting: Nurse Practitioner

## 2018-08-29 ENCOUNTER — Ambulatory Visit (INDEPENDENT_AMBULATORY_CARE_PROVIDER_SITE_OTHER): Payer: Medicare Other | Admitting: Nurse Practitioner

## 2018-08-29 DIAGNOSIS — R079 Chest pain, unspecified: Secondary | ICD-10-CM | POA: Diagnosis not present

## 2018-08-29 MED ORDER — NITROGLYCERIN 0.4 MG SL SUBL: 0.4000 mg | SUBLINGUAL_TABLET | SUBLINGUAL | 3 refills | Status: DC | PRN

## 2018-08-29 NOTE — Progress Notes (Signed)
Office Visit    Patient Name: Crystal Gregory Date of Encounter: 08/29/2018  Primary Care Provider:  Pleas Koch, NP Primary Cardiologist:  Ida Rogue, MD  Chief Complaint    84 year old female with a history of coronary calcification on CT, diastolic dysfunction, dyspnea, interstitial lung disease/pulmonary fibrosis, rheumatoid arthritis, and remote pneumonia and septicemia, who presents for follow-up after recent ER evaluation for chest pain.  Past Medical History    Past Medical History:  Diagnosis Date  . Allergy   . Chest pain   . Coronary artery calcification seen on CT scan   . Diastolic dysfunction    a. 10/2016 Echo: EF 60-65%, no rwma, Gr1 DD, mild MR, nl RV fxn, mild to mod TR, PASP 24mmHg.  Marland Kitchen Dyspnea   . GERD (gastroesophageal reflux disease)   . Interstitial lung disorders ([place])   . Pulmonary fibrosis ([place])   . Septicemia ([place]) 1999   spent 4 weeks in ICU, intubated -? PNA   Past Surgical History:  Procedure Laterality Date  . ABDOMINAL HYSTERECTOMY    . FLEXIBLE BRONCHOSCOPY N/A 07/05/2016   Procedure: FLEXIBLE BRONCHOSCOPY;  Surgeon: Vilinda Boehringer, MD;  Location: [facility] ORS;  Service: Cardiopulmonary;  Laterality: N/A;    Allergies  No Known Allergies  History of Present Illness    84 year old female with the above past medical history including coronary calcification noted on CT, diastolic dysfunction, dyspnea, interstitial lung disease/pulmonary fibrosis, rheumatoid arthritis, and remote pneumonia and septicemia.  She also has a history of remote tobacco abuse, quitting in 1982.  She was previously evaluated by Dr. Rockey Situ in November 2017 in the setting of questionable abnormal ECG.  She later underwent echocardiogram in January 2018 which showed normal LV function.  She has been followed closely by primary care and pulmonology in the setting of her interstitial lung disease and rheumatology in the setting of rheumatoid arthritis.  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  She typically does pretty well and together, they live in an assisted living facility.  On November 22, just before 5 PM, she had sudden onset of bilateral jaw pain that moved into her chest.  This was heavy in nature.  She became nauseated and mildly dyspneic.  She sat for about 15 minutes and when symptoms did not resolve, she approached her husband.  He gave her a sublingual nitroglycerin without immediate relief.  About 5 minutes later, she took a second sublingual nitroglycerin.  EMS was called and by the time they had arrived, symptoms had resolved.  She estimates that total duration of symptoms was about 30 minutes.  She is not sure if the nitroglycerin helped or not.  She was seen in the emergency department and troponins were normal x2, about 2 hours apart.  She was subsequently discharged.  She has not had any recurrence of chest pain.  She is chronic dyspnea which is unchanged.  She denies PND, palpitations, orthopnea, dizziness, syncope, edema, or early satiety.  Home Medications    Prior to Admission medications   Medication Sig Start Date End Date Taking? Authorizing Provider  azaTHIOprine (IMURAN) 50 MG tablet 1 TAB TWICE A DAY , 90 DAYS 05/23/18   [provider]  denosumab (PROLIA) 60 MG/ML SOLN injection Inject 60 mg into the skin every 6 (six) months. Administer in upper arm, thigh, or abdomen    [provider]  omeprazole (PRILOSEC) 20 MG capsule Take 1 capsule (20 mg total) by  mouth daily. 06/19/18   Pleas Koch, NP  pantoprazole (PROTONIX) 20 MG tablet Take 1 tablet by mouth once daily for heartburn. 07/19/18   Pleas Koch, NP  predniSONE (DELTASONE) 5 MG tablet Take 5 mg by mouth daily.    [provider]  propranolol (INDERAL) 10 MG tablet TAKE 1 TABLET TWICE A DAY for tremor. 07/20/18   Pleas Koch, NP  traZODone (DESYREL) 50 MG tablet TAKE 1 TABLET AT BEDTIME for sleep.  07/19/18   Pleas Koch, NP    Review of Systems    Chest pain and chronic dyspnea on exertion as above.  All other systems reviewed and are otherwise negative except as noted above.  Physical Exam    VS:  BP 130/60 (BP Location: Left Arm, Patient Position: Sitting, Cuff Size: Normal)   Pulse 63   Ht 5\' 8"  (1.727 m)   Wt 121 lb 4 oz (55 kg)   BMI 18.44 kg/m  , BMI Body mass index is 18.44 kg/m. GEN: Well nourished, well developed, in no acute distress. HEENT: normal. Neck: Supple, no JVD, carotid bruits, or masses. Cardiac: RRR, no murmurs, rubs, or gallops. No clubbing, cyanosis, edema.  Radials/DP/PT 2+ and equal bilaterally.  Respiratory:  Respirations regular and unlabored, fine end expiratory crackles noted bilaterally.  GI: Soft, nontender, nondistended, BS + x 4. MS: no deformity or atrophy. Skin: warm and dry, no rash. Neuro:  Strength and sensation are intact. Psych: Normal affect.  Accessory Clinical Findings    ECG personally reviewed by me today -regular sinus rhythm, 63- no acute changes.  Assessment & Plan    1.  Chest pain: Patient had an episode of chest discomfort earlier this month that lasts about 30 minutes and resolved after taking 2 sublingual nitroglycerin tablets.  She is not sure if the nitroglycerin helped, as it did not help immediately.  In the ER, troponins were normal x2 despite 30 minutes of symptoms at home.  She has not had any recurrent symptoms.  We discussed options for evaluation including stress testing and cardiac CT angiography.  She prefers to undergo stress testing and I have arranged for a Lexiscan Myoview for next week.  If this is normal, would not pursue additional ischemic evaluation.  I have provided her with a prescription for supplemental glycerin today.  2.  Interstitial lung disease: Relatively stable and followed closely by pulmonology.  3.  Rheumatoid arthritis: She does have intermittent joint pain and remains on Imuran,  Prolia, and daily prednisone therapy.  She is followed closely by rheumatology.  4.  Disposition: Follow-up stress testing as above.  Follow-up in clinic in approximately 3 weeks or sooner if necessary.   Murray Hodgkins, NP 08/29/2018, 6:37 PM

## 2018-08-29 NOTE — Patient Instructions (Signed)
Medication Instructions:  Your physician recommends that you continue on your current medications as directed. Please refer to the Current Medication list given to you today.  If you need a refill on your cardiac medications before your next appointment, please call your pharmacy.   Lab work: none If you have labs (blood work) drawn today and your tests are completely normal, you will receive your results only by: Mar[facility] Kitchen MyChart Message (if you have MyChart) OR . A paper copy in the mail If you have any lab test that is abnormal or we need to change your treatment, we will call you to review the results.  Testing/Procedures: Your physician has requested that you have a lexiscan myoview. For further information please visit [email or web address]. Please follow instruction sheet, as given.  [place]  Your caregiver has ordered a Stress Test with nuclear imaging. The purpose of this test is to evaluate the blood supply to your heart muscle. This procedure is referred to as a "Non-Invasive Stress Test." This is because other than having an IV started in your vein, nothing is inserted or "invades" your body. Cardiac stress tests are done to find areas of poor blood flow to the heart by determining the extent of coronary artery disease (CAD). Some patients exercise on a treadmill, which naturally increases the blood flow to your heart, while others who are  unable to walk on a treadmill due to physical limitations have a pharmacologic/chemical stress agent called Lexiscan . This medicine will mimic walking on a treadmill by temporarily increasing your coronary blood flow.   Please note: these test may take anywhere between 2-4 hours to complete  PLEASE REPORT TO [place] AT THE FIRST DESK WILL DIRECT YOU WHERE TO GO  Date of Procedure:_____________________________________  Arrival Time for Procedure:______________________________  Instructions regarding  medication:    PLEASE NOTIFY THE OFFICE AT LEAST 24 HOURS IN ADVANCE IF YOU ARE UNABLE TO KEEP YOUR APPOINTMENT.  [phone] AND  PLEASE NOTIFY NUCLEAR MEDICINE AT [facility] AT LEAST 24 HOURS IN ADVANCE IF YOU ARE UNABLE TO KEEP YOUR APPOINTMENT. [phone]  How to prepare for your Myoview test:  1. Do not eat or drink after midnight 2. No caffeine for 24 hours prior to test 3. No smoking 24 hours prior to test. 4. Your medication may be taken with water.  If your doctor stopped a medication because of this test, do not take that medication. 5. Ladies, please do not wear dresses.  Skirts or pants are appropriate. Please wear a short sleeve shirt. 6. No perfume, cologne or lotion. 7. Wear comfortable walking shoes. No heels!   Follow-Up: At [facility], you and your health needs are our priority.  As part of our continuing mission to provide you with exceptional heart care, we have created designated Provider Care Teams.  These Care Teams include your primary Cardiologist (physician) and Advanced Practice Providers (APPs -  Physician Assistants and Nurse Practitioners) who all work together to provide you with the care you need, when you need it. You will need a follow up appointment in 3 weeks.  Please call our office 2 months in advance to schedule this appointment.  You may see Ida Rogue, MD or Murray Hodgkins, NP.     Cardiac Nuclear Scan A cardiac nuclear scan is a test that measures blood flow to the heart when a person is resting and when he or she is exercising. The test looks for problems such as:  Not enough blood reaching a portion of the heart.  The heart muscle not working normally.  You may need this test if:  You have heart disease.  You have had abnormal lab results.  You have had heart surgery or angioplasty.  You have chest pain.  You have shortness of breath.  In this test, a radioactive dye (tracer) is injected into your bloodstream. After the  tracer has traveled to your heart, an imaging device is used to measure how much of the tracer is absorbed by or distributed to various areas of your heart. This procedure is usually done at a hospital and takes 2-4 hours. Tell a health care provider about:  Any allergies you have.  All medicines you are taking, including vitamins, herbs, eye drops, creams, and over-the-counter medicines.  Any problems you or family members have had with the use of anesthetic medicines.  Any blood disorders you have.  Any surgeries you have had.  Any medical conditions you have.  Whether you are pregnant or may be pregnant. What are the risks? Generally, this is a safe procedure. However, problems may occur, including:  Serious chest pain and heart attack. This is only a risk if the stress portion of the test is done.  Rapid heartbeat.  Sensation of warmth in your chest. This usually passes quickly.  What happens before the procedure?  Ask your health care provider about changing or stopping your regular medicines. This is especially important if you are taking diabetes medicines or blood thinners.  Remove your jewelry on the day of the procedure. What happens during the procedure?  An IV tube will be inserted into one of your veins.  Your health care provider will inject a small amount of radioactive tracer through the tube.  You will wait for 20-40 minutes while the tracer travels through your bloodstream.  Your heart activity will be monitored with an electrocardiogram (ECG).  You will lie down on an exam table.  Images of your heart will be taken for about 15-20 minutes.  You may be asked to exercise on a treadmill or stationary bike. While you exercise, your heart's activity will be monitored with an ECG, and your blood pressure will be checked. If you are unable to exercise, you may be given a medicine to increase blood flow to parts of your heart.  When blood flow to your heart  has peaked, a tracer will again be injected through the IV tube.  After 20-40 minutes, you will get back on the exam table and have more images taken of your heart.  When the procedure is over, your IV tube will be removed. The procedure may vary among health care providers and hospitals. Depending on the type of tracer used, scans may need to be repeated 3-4 hours later. What happens after the procedure?  Unless your health care provider tells you otherwise, you may return to your normal schedule, including diet, activities, and medicines.  Unless your health care provider tells you otherwise, you may increase your fluid intake. This will help flush the contrast dye from your body. Drink enough fluid to keep your urine clear or pale yellow.  It is up to you to get your test results. Ask your health care provider, or the department that is doing the test, when your results will be ready. Summary  A cardiac nuclear scan measures the blood flow to the heart when a person is resting and when he or she is exercising.  You  may need this test if you are at risk for heart disease.  Tell your health care provider if you are pregnant.  Unless your health care provider tells you otherwise, increase your fluid intake. This will help flush the contrast dye from your body. Drink enough fluid to keep your urine clear or pale yellow. This information is not intended to replace advice given to you by your health care provider. Make sure you discuss any questions you have with your health care provider. Document Released: 10/15/2004 Document Revised: 09/22/2016 Document Reviewed: 08/29/2013 Elsevier Interactive Patient Education  2017 [employer].

## 2018-09-04 ENCOUNTER — Telehealth: Payer: Self-pay

## 2018-09-04 ENCOUNTER — Encounter
Admission: RE | Admit: 2018-09-04 | Discharge: 2018-09-04 | Disposition: A | Discharge status: Home / Self Care | Payer: Medicare Other | Source: Ambulatory Visit | Attending: Nurse Practitioner | Admitting: Nurse Practitioner

## 2018-09-04 DIAGNOSIS — R079 Chest pain, unspecified: Secondary | ICD-10-CM | POA: Diagnosis not present

## 2018-09-04 LAB — NM MYOCAR MULTI W/SPECT W/WALL MOTION / EF
CHL CUP NUCLEAR SSS: 3
CSEPHR: 74 %
LV dias vol: 35 mL (ref 46–106)
LV sys vol: 9 mL
Peak HR: 101 {beats}/min
Rest HR: 69 {beats}/min
SDS: 3
SRS: 0

## 2018-09-04 MED ORDER — REGADENOSON 0.4 MG/5ML IV SOLN
0.4000 mg | Freq: Once | INTRAVENOUS | Status: AC
  Administered 2018-09-04: 0.4 mg via INTRAVENOUS

## 2018-09-04 MED ORDER — TECHNETIUM TC 99M TETROFOSMIN IV KIT
29.8130 | PACK | Freq: Once | INTRAVENOUS | Status: AC | PRN
  Administered 2018-09-04: 29.813 via INTRAVENOUS

## 2018-09-04 MED ORDER — TECHNETIUM TC 99M TETROFOSMIN IV KIT
10.2990 | PACK | Freq: Once | INTRAVENOUS | Status: AC | PRN
  Administered 2018-09-04: 10.299 via INTRAVENOUS

## 2018-09-07 ENCOUNTER — Ambulatory Visit (INDEPENDENT_AMBULATORY_CARE_PROVIDER_SITE_OTHER): Payer: Medicare Other | Admitting: *Deleted

## 2018-09-07 DIAGNOSIS — M810 Age-related osteoporosis without current pathological fracture: Secondary | ICD-10-CM

## 2018-09-07 MED ORDER — DENOSUMAB 60 MG/ML ~~LOC~~ SOSY
60.0000 mg | PREFILLED_SYRINGE | Freq: Once | SUBCUTANEOUS | Status: AC
  Administered 2018-09-07: 60 mg via SUBCUTANEOUS

## 2018-09-07 NOTE — Progress Notes (Signed)
Per orders of Kate Clark,NP injection of Prolia given by Claira Jeter Simpson. Patient tolerated injection well.  

## 2018-09-08 ENCOUNTER — Telehealth: Payer: Self-pay | Admitting: Primary Care

## 2018-09-08 NOTE — Telephone Encounter (Signed)
Results called to pt. Pt verbalized understanding of myoview results and is aware of upcoming appointment date and time.

## 2018-09-08 NOTE — Telephone Encounter (Signed)
Pt need refill for  Omeprazole 20 mg  Sent to [facility]

## 2018-09-08 NOTE — Telephone Encounter (Signed)
Per DPR, left detail message for patient that Crystal Gregory have already sent 90 days supply with 3 refills on 06/19/2018 which will cover for a year. Left message for patient to call express script for her additions refills.

## 2018-09-15 ENCOUNTER — Encounter: Payer: Self-pay | Admitting: Nurse Practitioner

## 2018-09-15 ENCOUNTER — Ambulatory Visit (INDEPENDENT_AMBULATORY_CARE_PROVIDER_SITE_OTHER): Payer: Medicare Other | Admitting: Nurse Practitioner

## 2018-09-15 VITALS — BP 110/58 | HR 80 | Ht 68.0 in | Wt 120.5 lb

## 2018-09-15 DIAGNOSIS — R079 Chest pain, unspecified: Secondary | ICD-10-CM

## 2018-09-15 NOTE — Progress Notes (Signed)
Office Visit    Patient Name: Crystal Gregory Date of Encounter: 09/15/2018  Primary Care Provider:  Pleas Koch, NP Primary Cardiologist:  Ida Rogue, MD  Chief Complaint    84 y/o ? with a history of coronary calcification on CT, diastolic dysfunction, dyspnea, interstitial lung disease/pulmonary fibrosis, rheumatoid arthritis, and remote pneumonia and septicemia, who presents for follow-up after recent stress testing in the setting of chest pain.  Past Medical History    Past Medical History:  Diagnosis Date  . Allergy   . Chest pain    a. 09/04/2018 MV: EF >65%. No ischemia/infarct.  . Coronary artery calcification seen on CT scan   . Diastolic dysfunction    a. 10/2016 Echo: EF 60-65%, no rwma, Gr1 DD, mild MR, nl RV fxn, mild to mod TR, PASP 74mmHg.  Marland Kitchen Dyspnea   . GERD (gastroesophageal reflux disease)   . Interstitial lung disorders ([place])   . Pulmonary fibrosis ([place])   . Septicemia ([place]) 1999   spent 4 weeks in ICU, intubated -? PNA   Past Surgical History:  Procedure Laterality Date  . ABDOMINAL HYSTERECTOMY    . FLEXIBLE BRONCHOSCOPY N/A 07/05/2016   Procedure: FLEXIBLE BRONCHOSCOPY;  Surgeon: Vilinda Boehringer, MD;  Location: [facility] ORS;  Service: Cardiopulmonary;  Laterality: N/A;   Allergies  No Known Allergies  History of Present Illness    84 year old female with the above past medical history including coronary calcification noted on CT, diastolic dysfunction, chronic dyspnea, interstitial lung disease/pulmonary fibrosis, rheumatoid arthritis, and remote pneumonia and septicemia.  She also has a history of remote tobacco abuse, quitting in 1982.  She was previous evaluated by Dr. Rockey Situ in November 2017 in the setting of questionable abnormal ECG.  She later underwent echocardiogram in January 2018, which showed normal LV function.  She was recently seen in the emergency department on November 22 in the setting of chest heaviness.  There was no objective  evidence of ischemia and she was subsequently discharged.  I saw her in clinic on November 26, and she denied any recurrence in symptoms.  She underwent Lexiscan Myoview, which was nonischemic with normal LV function.  She has had no recurrence of chest discomfort.  She does have some degree of chronic dyspnea on exertion though overall, she has been stable.  She denies PND, palpitations, orthopnea, dizziness, syncope, edema, or early satiety.  Home Medications    Prior to Admission medications   Medication Sig Start Date End Date Taking? Authorizing Provider  azaTHIOprine (IMURAN) 50 MG tablet Take 75 mg by mouth 2 (two) times daily.  05/23/18   [provider]  denosumab (PROLIA) 60 MG/ML SOLN injection Inject 60 mg into the skin every 6 (six) months. Administer in upper arm, thigh, or abdomen    [provider]  nitroGLYCERIN (NITROSTAT) 0.4 MG SL tablet Place 1 tablet (0.4 mg total) under the tongue every 5 (five) minutes as needed for chest pain. Maximum 3 doses. 08/29/18 11/27/18  Theora Gianotti, NP  omeprazole (PRILOSEC) 20 MG capsule Take 1 capsule (20 mg total) by mouth daily. 06/19/18   Pleas Koch, NP  pantoprazole (PROTONIX) 20 MG tablet Take 1 tablet by mouth once daily for heartburn. 07/19/18   Pleas Koch, NP  predniSONE (DELTASONE) 5 MG tablet Take 5 mg by mouth daily.    [provider]  propranolol (INDERAL) 10 MG tablet TAKE 1 TABLET TWICE A DAY for tremor. 07/20/18   Pleas Koch, NP  traZODone (DESYREL) 50 MG tablet TAKE 1 TABLET AT BEDTIME for sleep. 07/19/18   Pleas Koch, NP    Review of Systems    Chronic dyspnea on exertion as outlined above.  Chronic arthritic pain managed by rheumatology.  She denies chest pain, palpitations, pnd, orthopnea, n, v, dizziness, syncope, edema, weight gain, or early satiety.  All other systems reviewed and are otherwise negative except as noted above.  Physical Exam    VS:  BP  (!) 110/58 (BP Location: Left Arm, Patient Position: Sitting, Cuff Size: Normal)   Pulse 80   Ht 5\' 8"  (1.727 m)   Wt 120 lb 8 oz (54.7 kg)   BMI 18.32 kg/m  , BMI Body mass index is 18.32 kg/m. GEN: Well nourished, well developed, in no acute distress. HEENT: normal. Neck: Supple, no JVD, carotid bruits, or masses. Cardiac: RRR, no murmurs, rubs, or gallops. No clubbing, cyanosis, edema.  Radials/DP/PT 2+ and equal bilaterally.  Respiratory:  Respirations regular and unlabored, bibasilar crackles. GI: Soft, nontender, nondistended, BS + x 4. MS: no deformity or atrophy. Skin: warm and dry, no rash. Neuro:  Strength and sensation are intact. Psych: Normal affect.  Accessory Clinical Findings    Stress testing reviewed with patient and past medical history updated with results-above.  Assessment & Plan    1.  Chest pain: Patient had an episode of chest discomfort in late November and was seen in the emergency department with negative enzymes.  Recent stress testing was nonischemic.  She has had no recurrent symptoms.  No further ischemic work-up warranted.  2.  Interstitial lung disease: Relatively stable and followed closely by pulmonology.  3.  Rheumatoid arthritis: Followed closely by rheumatology.  Overall improved over the past 2 months on Imuran, Prolia, and daily prednisone.  4.  Disposition: Patient wishes to follow-up PRN.   Murray Hodgkins, NP 09/15/2018, 9:31 AM

## 2018-09-15 NOTE — Patient Instructions (Signed)
Medication Instructions:  Your physician recommends that you continue on your current medications as directed. Please refer to the Current Medication list given to you today.  If you need a refill on your cardiac medications before your next appointment, please call your pharmacy.   Lab work: None today   If you have labs (blood work) drawn today and your tests are completely normal, you will receive your results only by: Marland Kitchen MyChart Message (if you have MyChart) OR . A paper copy in the mail If you have any lab test that is abnormal or we need to change your treatment, we will call you to review the results.  Testing/Procedures: None ordered   Follow-Up: At [facility], you and your health needs are our priority.  As part of our continuing mission to provide you with exceptional heart care, we have created designated Provider Care Teams.  These Care Teams include your primary Cardiologist (physician) and Advanced Practice Providers (APPs -  Physician Assistants and Nurse Practitioners) who all work together to provide you with the care you need, when you need it. Follow up as needed.  Please call our office 2 months in advance to schedule this appointment.  You may see Dr. Rockey Situ or one of the following Advanced Practice Providers on your designated Care Team:   Murray Hodgkins, NP Christell Faith, PA-C . Marrianne Mood, PA-C

## 2018-10-03 DIAGNOSIS — M059 Rheumatoid arthritis with rheumatoid factor, unspecified: Secondary | ICD-10-CM | POA: Diagnosis not present

## 2018-10-03 DIAGNOSIS — M810 Age-related osteoporosis without current pathological fracture: Secondary | ICD-10-CM | POA: Diagnosis not present

## 2018-10-03 DIAGNOSIS — J84112 Idiopathic pulmonary fibrosis: Secondary | ICD-10-CM | POA: Diagnosis not present

## 2018-10-03 DIAGNOSIS — Z79899 Other long term (current) drug therapy: Secondary | ICD-10-CM | POA: Diagnosis not present

## 2018-11-23 ENCOUNTER — Ambulatory Visit (HOSPITAL_COMMUNITY): Payer: Medicare Other

## 2018-11-23 ENCOUNTER — Ambulatory Visit
Admission: RE | Admit: 2018-11-23 | Discharge: 2018-11-23 | Disposition: A | Discharge status: Home / Self Care | Payer: Medicare Other | Source: Ambulatory Visit | Attending: Pulmonary Disease | Admitting: Pulmonary Disease

## 2018-11-23 DIAGNOSIS — J8410 Pulmonary fibrosis, unspecified: Secondary | ICD-10-CM | POA: Diagnosis not present

## 2018-11-23 DIAGNOSIS — J8489 Other specified interstitial pulmonary diseases: Secondary | ICD-10-CM | POA: Diagnosis not present

## 2018-11-23 MED ORDER — ALBUTEROL SULFATE (2.5 MG/3ML) 0.083% IN NEBU
2.5000 mg | INHALATION_SOLUTION | Freq: Once | RESPIRATORY_TRACT | Status: AC
  Administered 2018-11-23: 2.5 mg via RESPIRATORY_TRACT
  Filled 2018-11-23: qty 3

## 2018-11-30 ENCOUNTER — Encounter: Payer: Self-pay | Admitting: Pulmonary Disease

## 2018-11-30 ENCOUNTER — Ambulatory Visit (INDEPENDENT_AMBULATORY_CARE_PROVIDER_SITE_OTHER): Payer: Medicare Other | Admitting: Pulmonary Disease

## 2018-11-30 VITALS — BP 124/60 | HR 80 | Ht 68.0 in | Wt 122.6 lb

## 2018-11-30 DIAGNOSIS — M0510 Rheumatoid lung disease with rheumatoid arthritis of unspecified site: Secondary | ICD-10-CM

## 2018-11-30 DIAGNOSIS — J8410 Pulmonary fibrosis, unspecified: Secondary | ICD-10-CM | POA: Diagnosis not present

## 2018-11-30 DIAGNOSIS — R05 Cough: Secondary | ICD-10-CM | POA: Diagnosis not present

## 2018-11-30 DIAGNOSIS — R059 Cough, unspecified: Secondary | ICD-10-CM

## 2018-11-30 DIAGNOSIS — K219 Gastro-esophageal reflux disease without esophagitis: Secondary | ICD-10-CM | POA: Diagnosis not present

## 2018-11-30 MED ORDER — PANTOPRAZOLE SODIUM 40 MG PO TBEC: 40.0000 mg | DELAYED_RELEASE_TABLET | Freq: Every day | ORAL | 10 refills | Status: DC

## 2018-11-30 MED ORDER — FAMOTIDINE 20 MG PO TABS: 20.0000 mg | ORAL_TABLET | Freq: Every day | ORAL | 10 refills | Status: DC

## 2018-11-30 MED ORDER — DM-GUAIFENESIN ER 60-1200 MG PO TB12: 1.0000 | ORAL_TABLET | Freq: Every evening | ORAL | 10 refills | Status: DC

## 2018-11-30 NOTE — Patient Instructions (Signed)
Please note that omeprazole and Protonix are the same medication and may be used interchangeably at the same dose.  Therefore you may use up the medication such you have and then we will consolidate these medications into one.  I want you to take 40 mg of either one at bedtime.    I have changed your prescription to Protonix 40 mg  Begin famotidine (Pepcid) 20 mg each morning  Robitussin-DM or equivalent at bedtime for cough  Follow-up in 6 to 8 weeks for quick recheck

## 2018-11-30 NOTE — Progress Notes (Signed)
PULMONARY OFFICE FOLLOW UP NOTE  PROBLEMS: History of prior ARDS 1999 Prior long term nitrofurantoin therapy Pulmonary fibrosis Bronchiectasis Mild COPD  PT PROFILE: 85 y.o. F with remote minimal smoking history (15 PY, quit 1982) initially evaluated by Dr Stevenson Clinch 03/2016 and subsequently by Dr Chase Caller for pulmonary fibrosis. Pt has prior history of severe ARDS (1999) and has been on chronic nitrofurantoin for recurrent UTIs. She has been tried on 2 maintenance inhalers without discernible benefit.   DATA: CT chest 03/22/16: Generalized pulmonary hyperinflation but without bullous emphysema. Widespread bronchiectasis with bronchial wall thickening consistent with inflammatory bronchitis. Areas of pulmonary fibrosis, most pronounced in the lower lobes, where the bronchiectasis is more extensive, including areas of pulmonary lung destruction HRCT chest 06/11/16: Diffuse cylindrical and varicoid bronchiectasis throughout both lungs, most severe at the lung bases. Basilar predominant fibrotic interstitial lung disease with patchy honeycombing, with mild progression in the short interval since 03/22/2016 Spirometry 06/18/16: Mild obstruction, FEV1 1.42 liters (63%), FVC 2.43 (80%) Bronchoscopy 07/05/16: Normal airway exam. BAL negative for AFB Echocardiogram 10/07/15: LVEF 60%, Grade I DD, mild MR, RVSP estimate 35 mmHg HRCT 11/04/16: again compatible with interstitial lung disease, and considered diagnostic of usual interstitial pneumonia (UIP) from an imaging standpoint. There has been no significant progression of disease compared to the recent prior examination PFT 02/10/17: no obstruction, normal TLC, moderate reduction in DLCO 6MWT 03/11/17: 345 m. No desaturation PFTs 11/22/17: No obstruction.  Lung volumes normal.  DLCO moderately reduced (55% predicted). [place] since 02/10/17 PFTs 11/23/18 : FVC: 2.67 L (95 %pred), FEV1: 2.35 L (112 %pred), FEV1/FVC: 88%, TLC: 4.98 L (88 %pred), DLCO 42  %pred    INTERVAL: Last see 05/22/18. No major pulmonary events.    SUBJ: This is a scheduled follow-up.  Overall, her respiratory status is largely unchanged.  However, she has noted some mild increased cough and hoarseness with posterior nasal drainage in the past few weeks.  She has very mild exertional dyspnea.  With regard to cough, she describes 2-3 paroxysms per day.  More troubling, she has nocturnal coughing paroxysms that awakens her from sleep.  Her cough is mostly nonproductive.  She denies CP, fever, purulent sputum, hemoptysis, LE edema and calf tenderness.  OBJ: Vitals:   11/30/18 0857 11/30/18 0900  BP:  124/60  Pulse:  80  SpO2:  100%  Weight: 122 lb 9.6 oz (55.6 kg)   Height: 5\' 8"  (1.727 m)   RA  Gen: WDWN in NAD HEENT: NCAT, sclerae white, oropharynx normal Neck: No LAN, no JVD noted Lungs: L >R basilar crackles, no wheezes Cardiovascular: Regular, normal rate, no M noted Abdomen: Soft, NT, +BS Ext: no C/C/E Neuro: PERRL, EOMI, motor/sensory grossly intact Skin: No lesions noted   DATA: BMP Latest Ref Rng & Units 08/25/2018 02/10/2018 08/25/2017  Glucose 70 - 99 mg/dL 121(H) - 135(H)  BUN 8 - 23 mg/dL 18 - 16  Creatinine 0.44 - 1.00 mg/dL 0.59 - 0.54  Sodium 135 - 145 mmol/L 135 - 138  Potassium 3.5 - 5.1 mmol/L 4.1 - 3.7  Chloride 98 - 111 mmol/L 103 - 110  CO2 22 - 32 mmol/L 22 - 19(L)  Calcium 8.9 - 10.3 mg/dL 8.6(L) 9.6 6.7(L)   CBC Latest Ref Rng & Units 08/25/2018 08/25/2017 08/23/2017  WBC 4.0 - 10.5 K/uL 6.8 6.1 7.5  Hemoglobin 12.0 - 15.0 g/dL 11.7(L) 8.9(L) 10.7(L)  Hematocrit 36.0 - 46.0 % 34.2(L) 26.2(L) 30.9(L)  Platelets 150 - 400 K/uL 306 225 236  CXR: No significant change in chronic fibrotic changes, predominantly in bases  IMPRESSION:   ICD-10-CM   1. Pulmonary fibrosis ([place]) J84.10   2. Rheumatoid lung ([place]) M05.10   3. Acute exacerbation of chronic cough R05   4. Gastroesophageal reflux disease, esophagitis presence not  specified K21.9      There appears to be little or no progression of pulmonary fibrosis by CXR and PFTs  Her cough is most likely related to GERD  She is on both omeprazole and pantoprazole taking them both at the same time at bedtime.  This does not make any sense to me.  We will consolidate these medications into a single medication.  PLAN: Change omeprazole plus pantoprazole to pantoprazole 40 mg daily at bedtime.  I have made these changes in her MAR.  Begin famotidine (Pepcid) 20 mg each morning  New Rx: Robitussin-DM or equivalent at bedtime for cough  Follow-up in 6 to 8 weeks for quick recheck regarding cough   [name], MD PCCM service Mobile [phone] Pager [phone] 11/30/2018 10:59 AM

## 2019-01-11 ENCOUNTER — Encounter: Payer: Self-pay | Admitting: Pulmonary Disease

## 2019-01-11 ENCOUNTER — Ambulatory Visit (INDEPENDENT_AMBULATORY_CARE_PROVIDER_SITE_OTHER): Payer: Medicare Other | Admitting: Pulmonary Disease

## 2019-01-11 DIAGNOSIS — J8410 Pulmonary fibrosis, unspecified: Secondary | ICD-10-CM

## 2019-01-11 DIAGNOSIS — R05 Cough: Secondary | ICD-10-CM | POA: Diagnosis not present

## 2019-01-11 DIAGNOSIS — K219 Gastro-esophageal reflux disease without esophagitis: Secondary | ICD-10-CM | POA: Diagnosis not present

## 2019-01-11 DIAGNOSIS — R059 Cough, unspecified: Secondary | ICD-10-CM

## 2019-01-11 NOTE — Progress Notes (Signed)
Virtual Visit via Telephone Note  I connected with Crystal Gregory on 01/22/19 at  9:15 AM EDT by telephone and verified that I am speaking with the correct person using two identifiers.   I discussed the limitations, risks, security and privacy concerns of performing an evaluation and management service by telephone and the availability of in person appointments. I also discussed with the patient that there may be a patient responsible charge related to this service. The patient expressed understanding and agreed to proceed.  DATA: CT chest 03/22/16: Generalized pulmonary hyperinflation but without bullous emphysema. Widespread bronchiectasis with bronchial wall thickening consistent with inflammatory bronchitis. Areas of pulmonary fibrosis, most pronounced in the lower lobes, where the bronchiectasis is more extensive, including areas of pulmonary lung destruction HRCT chest 06/11/16: Diffuse cylindrical and varicoid bronchiectasis throughout both lungs, most severe at the lung bases. Basilar predominant fibrotic interstitial lung disease with patchy honeycombing, with mild progression in the short interval since 03/22/2016 Spirometry 06/18/16: Mild obstruction, FEV1 1.42 liters (63%), FVC 2.43 (80%) Bronchoscopy 07/05/16: Normal airway exam. BAL negative for AFB Echocardiogram 10/07/15: LVEF 60%, Grade I DD, mild MR, RVSP estimate 35 mmHg HRCT 11/04/16: again compatible with interstitial lung disease, and considered diagnostic of usual interstitial pneumonia (UIP) from an imaging standpoint. There has been no significant progression of disease compared to the recent prior examination PFT 02/10/17: no obstruction, normal TLC, moderate reduction in DLCO 6MWT 03/11/17: 345 m. No desaturation PFTs 11/22/17: No obstruction.  Lung volumes normal.  DLCO moderately reduced (55% predicted). [place] since 02/10/17 PFTs 11/23/18 : FVC: 2.67 L (95 %pred), FEV1: 2.35 L (112 %pred), FEV1/FVC: 88%, TLC: 4.98 L (88  %pred), DLCO 42 %pred   History of Present Illness: Last visit 02/27.  At that time, she had chief complaint of paroxysms of cough.  It was my impression that likely cause of cough was GERD.  She was already on a PPI.  I added famotidine, 20 mg daily.  I recommended Robitussin-DM or equivalent at bedtime for cough.  She reports that her cough paroxysms are less severe and less frequent.  She has no new complaints.  She has minimal exertional dyspnea.  She denies CP, fever, purulent sputum, hemoptysis, LE edema and calf tenderness.   Observations/Objective: No coughing and not apparently dyspneic during this telephone encounter  Assessment and Plan:   ICD-10-CM   1. Pulmonary fibrosis, likely rheumatoid lung.  History of ARDS.  History of nitrofurantoin use.  Radiographically and physiologically stable J84.10   2. Cough R05   3. GERD, suspected K21.9    Continue all current therapies as noted on intake form including pantoprazole and famotidine  Follow Up Instructions: Follow-up in 3 months.  Call sooner if needed   I discussed the assessment and treatment plan with the patient. The patient was provided an opportunity to ask questions and all were answered. The patient agreed with the plan and demonstrated an understanding of the instructions.   The patient was advised to call back or seek an in-person evaluation if the symptoms worsen or if the condition fails to improve as anticipated.  I provided 14 minutes of non-face-to-face time during this encounter.  [name], MD PCCM service Mobile [phone] Pager [phone] 01/22/2019 11:05 AM

## 2019-01-11 NOTE — Patient Instructions (Addendum)
Continue all current therapies as noted in intake form including pantoprazole and famotidine Follow-up in 3 months for quick recheck.  Call sooner if needed

## 2019-01-18 ENCOUNTER — Encounter: Payer: Self-pay | Admitting: Primary Care

## 2019-01-18 ENCOUNTER — Ambulatory Visit (INDEPENDENT_AMBULATORY_CARE_PROVIDER_SITE_OTHER): Payer: Medicare Other | Admitting: Primary Care

## 2019-01-18 DIAGNOSIS — J84112 Idiopathic pulmonary fibrosis: Secondary | ICD-10-CM | POA: Diagnosis not present

## 2019-01-18 DIAGNOSIS — M069 Rheumatoid arthritis, unspecified: Secondary | ICD-10-CM

## 2019-01-18 DIAGNOSIS — M810 Age-related osteoporosis without current pathological fracture: Secondary | ICD-10-CM

## 2019-01-18 DIAGNOSIS — K219 Gastro-esophageal reflux disease without esophagitis: Secondary | ICD-10-CM

## 2019-01-18 DIAGNOSIS — J849 Interstitial pulmonary disease, unspecified: Secondary | ICD-10-CM

## 2019-01-18 DIAGNOSIS — G4700 Insomnia, unspecified: Secondary | ICD-10-CM | POA: Diagnosis not present

## 2019-01-18 MED ORDER — AZATHIOPRINE 50 MG PO TABS: ORAL_TABLET | ORAL | 0 refills | Status: DC

## 2019-01-18 NOTE — Assessment & Plan Note (Signed)
Following with pulmonology and is doing well.  Continue current regimen.

## 2019-01-18 NOTE — Assessment & Plan Note (Signed)
Doing well on Trazodone PRN, continue same.

## 2019-01-18 NOTE — Assessment & Plan Note (Signed)
Compliant to Prolia. Continue same.

## 2019-01-18 NOTE — Assessment & Plan Note (Signed)
Overall doing well on prednisone 3 mg. Continue same. Following with rheumatology.

## 2019-01-18 NOTE — Assessment & Plan Note (Signed)
Doing better with increased dose of pantoprazole 40 mg and famotidine. Continue same.

## 2019-01-18 NOTE — Progress Notes (Signed)
Subjective:    Patient ID: Crystal Gregory, female    DOB: [birth year], 85 y.o.   MRN: [id number]  HPI     Crystal Gregory - 85 y.o. female  MRN [id number]  Date of Birth: Dec 08, 1931  PCP: Pleas Koch, NP  This service was provided via telemedicine. Phone Visit performed on 01/18/2019    Rationale for phone visit along with limitations reviewed. Patient consented to telephone encounter.    Location of patient: Home Location of provider: Office at [employer] @ [facility] Name of referring provider: N/A   Names of persons and role in encounter: Provider: Pleas Koch, NP  Patient: Crystal Gregory  Other: N/A   Time on call: 7 min 15 seconds   Subjective: CC: General Healthcare Questions HPI:  Crystal Gregory is an 85 year old female with a history of interstitial lung disease, pulmonary fibrosis, bronchiectasis, rheumatoid arthritis who presents today with a few questions and for follow up.  1) Rheumatoid Arthritis: She is managed on prednisone 3 mg daily for her rheumatoid arthritis. Also on Prolia for osteoporosis. Overall doing well. She has a follow up visit scheduled with her rheumatologist next month.   2) GERD: Currently managed on pantoprazole 40 mg which was recently increased from 20 mg per pulmonology. Also now taking famotidine 20 mg for cough. Overall doing well.   3) IPF/ILD: Currently following with pulmonology and managed on Imuran 75 mg in the AM and 50 mg in the PM. Overall doing well. She is only going out of her home once weekly to go to the grocery store. She is using a bandana to cover her mouth. She goes in the early hours and the store is nearly empty at that time. She's had no direct contact with anyone.   She denies fevers, changes in cough, changes in shortness of breath.   4) Insomnia: Currently managed on Trazodone 50 mg for which she uses PRN. Overall doing well.   Objective/Observations:   No physical exam or vital signs collected unless  specifically identified below.   There were no vitals taken for this visit.   Respiratory status: speaks in complete sentences without evident shortness of breath.   Assessment/Plan:  See problem based charting.  No problem-specific Assessment & Plan notes found for this encounter.   I discussed the assessment and treatment plan with the patient. The patient was provided an opportunity to ask questions and all were answered. The patient agreed with the plan and demonstrated an understanding of the instructions.  Lab Orders  No laboratory test(s) ordered today    No orders of the defined types were placed in this encounter.   The patient was advised to call back or seek an in-person evaluation if the symptoms worsen or if the condition fails to improve as anticipated.  Pleas Koch, NP    Review of Systems  Constitutional: Negative for chills and fever.  Respiratory: Negative for shortness of breath.   Cardiovascular: Negative for chest pain.  Neurological: Negative for dizziness.       Past Medical History:  Diagnosis Date  . Allergy   . Chest pain    a. 09/04/2018 MV: EF >65%. No ischemia/infarct.  . Coronary artery calcification seen on CT scan   . Diastolic dysfunction    a. 10/2016 Echo: EF 60-65%, no rwma, Gr1 DD, mild MR, nl RV fxn, mild to mod TR, PASP 87mmHg.  Marland Kitchen Dyspnea   . GERD (gastroesophageal reflux disease)   .  Interstitial lung disorders ([place])   . Pulmonary fibrosis ([place])   . Septicemia ([place]) 1999   spent 4 weeks in ICU, intubated -? PNA     Social History   Socioeconomic History  . Marital status: Married    Spouse name: Not on file  . Number of children: 2  . Years of education: Not on file  . Highest education level: Not on file  Occupational History  . Occupation: Retired   Scientific laboratory technician  . Financial resource strain: Not on file  . Food insecurity:    Worry: Not on file    Inability: Not on file  . Transportation needs:     Medical: Not on file    Non-medical: Not on file  Tobacco Use  . Smoking status: Former Smoker    Packs/day: 0.50    Years: 30.00    Pack years: 15.00    Last attempt to quit: 01/16/1981    Years since quitting: 38.0  . Smokeless tobacco: Never Used  Substance and Sexual Activity  . Alcohol use: Yes    Comment: 2 drinks daily   . Drug use: No  . Sexual activity: Not on file  Lifestyle  . Physical activity:    Days per week: Not on file    Minutes per session: Not on file  . Stress: Not on file  Relationships  . Social connections:    Talks on phone: Not on file    Gets together: Not on file    Attends religious service: Not on file    Active member of club or organization: Not on file    Attends meetings of clubs or organizations: Not on file    Relationship status: Not on file  . Intimate partner violence:    Fear of current or ex partner: Not on file    Emotionally abused: Not on file    Physically abused: Not on file    Forced sexual activity: Not on file  Other Topics Concern  . Not on file  Social History Narrative   Married.     2 children.  Lives with her husband in [place].    Past Surgical History:  Procedure Laterality Date  . ABDOMINAL HYSTERECTOMY    . FLEXIBLE BRONCHOSCOPY N/A 07/05/2016   Procedure: FLEXIBLE BRONCHOSCOPY;  Surgeon: Vilinda Boehringer, MD;  Location: [facility] ORS;  Service: Cardiopulmonary;  Laterality: N/A;    Family History  Problem Relation Age of Onset  . Parkinson's disease Mother   . Colon polyps Mother 32          . Breast cancer Sister     No Known Allergies  Current Outpatient Medications on File Prior to Visit  Medication Sig Dispense Refill  . denosumab (PROLIA) 60 MG/ML SOLN injection Inject 60 mg into the skin every 6 (six) months. Administer in upper arm, thigh, or abdomen    . Dextromethorphan-Guaifenesin 60-1200 MG 12hr tablet Take 1 tablet by mouth Nightly. 30 tablet 10  . famotidine (PEPCID) 20 MG tablet Take 1  tablet (20 mg total) by mouth daily. Each morning 30 tablet 10  . pantoprazole (PROTONIX) 40 MG tablet Take 1 tablet (40 mg total) by mouth daily. 30 tablet 10  . predniSONE (DELTASONE) 5 MG tablet Take 3 mg by mouth daily.     . propranolol (INDERAL) 10 MG tablet TAKE 1 TABLET TWICE A DAY for tremor. 180 tablet 3  . traZODone (DESYREL) 50 MG tablet TAKE 1 TABLET AT BEDTIME for sleep.  90 tablet 3  . nitroGLYCERIN (NITROSTAT) 0.4 MG SL tablet Place 1 tablet (0.4 mg total) under the tongue every 5 (five) minutes as needed for chest pain. Maximum 3 doses. (Patient not taking: Reported on 01/18/2019) 25 tablet 3   No current facility-administered medications on file prior to visit.     There were no vitals taken for this visit.   Objective:   Physical Exam  Constitutional: She is oriented to person, place, and time.  Respiratory: Effort normal. No respiratory distress.  Neurological: She is alert and oriented to person, place, and time.  Psychiatric: She has a normal mood and affect.           Assessment & Plan:

## 2019-01-18 NOTE — Patient Instructions (Signed)
Continue your current regimen per pulmonology and rheumatology.  Continue to avoid public places and continue to cover your mouth when out.  It was a pleasure to see you today!

## 2019-01-18 NOTE — Assessment & Plan Note (Addendum)
Following with pulmonology and doing well.  Continue current regimen.  Encouraged her to continue to practice social distancing, commended her for her practices.

## 2019-01-22 ENCOUNTER — Encounter: Payer: Self-pay | Admitting: Pulmonary Disease

## 2019-01-25 DIAGNOSIS — Z79899 Other long term (current) drug therapy: Secondary | ICD-10-CM | POA: Diagnosis not present

## 2019-01-31 DIAGNOSIS — J849 Interstitial pulmonary disease, unspecified: Secondary | ICD-10-CM | POA: Diagnosis not present

## 2019-01-31 DIAGNOSIS — M059 Rheumatoid arthritis with rheumatoid factor, unspecified: Secondary | ICD-10-CM | POA: Diagnosis not present

## 2019-01-31 DIAGNOSIS — Z79899 Other long term (current) drug therapy: Secondary | ICD-10-CM | POA: Diagnosis not present

## 2019-02-22 ENCOUNTER — Telehealth: Payer: Self-pay | Admitting: Primary Care

## 2019-02-22 DIAGNOSIS — M810 Age-related osteoporosis without current pathological fracture: Secondary | ICD-10-CM

## 2019-02-22 NOTE — Telephone Encounter (Signed)
Prolia benefits submitted. °

## 2019-02-22 NOTE — Telephone Encounter (Signed)
Crystal Gregory, will you please have patient scheduled for repeat BMP to check calcium? Last one on file was from November 2019.  Lower calcium is acceptable, higher calcium can be dangerous with Prolia.  Once BMP has resulted we should be able to proceed.

## 2019-02-22 NOTE — Telephone Encounter (Signed)
Pt had abnl calcium lab 08-25-18.  Pt due for Prolia 03-10-2019.  Please advise.

## 2019-02-23 NOTE — Telephone Encounter (Signed)
Message left for patient to return my call.  

## 2019-02-27 NOTE — Telephone Encounter (Signed)
Per DPR, left detail message of [name] comments for patient to call back and schedule a lab appointment for BMP.

## 2019-02-28 ENCOUNTER — Other Ambulatory Visit (INDEPENDENT_AMBULATORY_CARE_PROVIDER_SITE_OTHER): Payer: Medicare Other

## 2019-02-28 DIAGNOSIS — M810 Age-related osteoporosis without current pathological fracture: Secondary | ICD-10-CM

## 2019-02-28 LAB — BASIC METABOLIC PANEL
BUN: 17 mg/dL (ref 6–23)
CO2: 29 mEq/L (ref 19–32)
Calcium: 9.4 mg/dL (ref 8.4–10.5)
Chloride: 100 mEq/L (ref 96–112)
Creatinine, Ser: 0.75 mg/dL (ref 0.40–1.20)
GFR: 73.41 mL/min (ref >60.00)
Glucose, Bld: 111 mg/dL — ABNORMAL HIGH (ref 70–99)
Potassium: 4.4 mEq/L (ref 3.5–5.1)
Sodium: 136 mEq/L (ref 135–145)

## 2019-03-02 ENCOUNTER — Telehealth: Payer: Self-pay | Admitting: Primary Care

## 2019-03-02 NOTE — Telephone Encounter (Signed)
Discussed Prolia benefits.  Pt would owe deductible $198 if not met, if met would owe$0.  Pt scheduled for next injection.

## 2019-03-13 ENCOUNTER — Ambulatory Visit (INDEPENDENT_AMBULATORY_CARE_PROVIDER_SITE_OTHER): Payer: Medicare Other

## 2019-03-13 DIAGNOSIS — M810 Age-related osteoporosis without current pathological fracture: Secondary | ICD-10-CM | POA: Diagnosis not present

## 2019-03-13 MED ORDER — DENOSUMAB 60 MG/ML ~~LOC~~ SOSY
60.0000 mg | PREFILLED_SYRINGE | Freq: Once | SUBCUTANEOUS | Status: AC
  Administered 2019-03-13: 60 mg via SUBCUTANEOUS

## 2019-03-13 NOTE — Progress Notes (Signed)
Per orders of Alma Friendly, NP, injection of denosumab 60mg  given by Diamond Nickel, RN.  Administered subcutaneously to left arm.   Patient tolerated injection well.

## 2019-04-18 ENCOUNTER — Telehealth: Payer: Self-pay | Admitting: Pulmonary Disease

## 2019-04-18 ENCOUNTER — Ambulatory Visit: Payer: Medicare Other | Admitting: Pulmonary Disease

## 2019-04-18 NOTE — Telephone Encounter (Signed)
Called patient for COVID-19 pre-screening for in office visit.  Have you recently traveled any where out of the local area in the last 2 weeks? no  Have you been in close contact with a person diagnosed with COVID-19 or someone awaiting results within the last 2 weeks? no  Do you currently have any of the following symptoms? If so, when did they start? Cough-baseline- present for years     Diarrhea   Joint Pain Fever      Muscle Pain   Red eyes Shortness of breath- baseline- present for years   Abdominal pain  Vomiting Loss of smell    Rash    Sore Throat Headache    Weakness   Bruising or bleeding   Okay to proceed with visit.

## 2019-04-20 ENCOUNTER — Ambulatory Visit (INDEPENDENT_AMBULATORY_CARE_PROVIDER_SITE_OTHER): Payer: Medicare Other | Admitting: Pulmonary Disease

## 2019-04-20 ENCOUNTER — Other Ambulatory Visit: Payer: Self-pay

## 2019-04-20 VITALS — BP 120/70 | HR 87

## 2019-04-20 DIAGNOSIS — R05 Cough: Secondary | ICD-10-CM

## 2019-04-20 DIAGNOSIS — K219 Gastro-esophageal reflux disease without esophagitis: Secondary | ICD-10-CM | POA: Diagnosis not present

## 2019-04-20 DIAGNOSIS — J8410 Pulmonary fibrosis, unspecified: Secondary | ICD-10-CM

## 2019-04-20 DIAGNOSIS — R053 Chronic cough: Secondary | ICD-10-CM

## 2019-04-20 MED ORDER — PANTOPRAZOLE SODIUM 20 MG PO TBEC: 20.0000 mg | DELAYED_RELEASE_TABLET | Freq: Two times a day (BID) | ORAL | 1 refills | Status: DC

## 2019-04-20 MED ORDER — PANTOPRAZOLE SODIUM 20 MG PO TBEC: 20.0000 mg | DELAYED_RELEASE_TABLET | Freq: Two times a day (BID) | ORAL | 12 refills | Status: DC

## 2019-04-20 MED ORDER — FLUTICASONE PROPIONATE 50 MCG/ACT NA SUSP: 1.0000 | Freq: Every day | NASAL | 5 refills | Status: DC

## 2019-04-20 MED ORDER — FLUTICASONE PROPIONATE 50 MCG/ACT NA SUSP: 1.0000 | Freq: Every day | NASAL | 1 refills | Status: DC

## 2019-04-20 NOTE — Patient Instructions (Signed)
Change Protonix to 20 mg twice a day Continue famotidine 20 mg daily Flonase 1 spray per nostril daily.  May be taken in the morning or at night Phone visit follow-up in 4-6 weeks

## 2019-04-20 NOTE — Progress Notes (Signed)
PULMONARY OFFICE FOLLOW UP NOTE  PROBLEMS: History of prior ARDS 1999 Prior long term nitrofurantoin therapy Pulmonary fibrosis Bronchiectasis Mild COPD  PT PROFILE: 85 y.o. F with remote minimal smoking history (15 PY, quit 1982) initially evaluated by Dr Stevenson Clinch 03/2016 and subsequently by Dr Chase Caller for pulmonary fibrosis. Pt has prior history of severe ARDS (1999) and has been on chronic nitrofurantoin for recurrent UTIs. She has been tried on 2 maintenance inhalers without discernible benefit.   DATA: CT chest 03/22/16: Generalized pulmonary hyperinflation but without bullous emphysema. Widespread bronchiectasis with bronchial wall thickening consistent with inflammatory bronchitis. Areas of pulmonary fibrosis, most pronounced in the lower lobes, where the bronchiectasis is more extensive, including areas of pulmonary lung destruction HRCT chest 06/11/16: Diffuse cylindrical and varicoid bronchiectasis throughout both lungs, most severe at the lung bases. Basilar predominant fibrotic interstitial lung disease with patchy honeycombing, with mild progression in the short interval since 03/22/2016 Spirometry 06/18/16: Mild obstruction, FEV1 1.42 liters (63%), FVC 2.43 (80%) Bronchoscopy 07/05/16: Normal airway exam. BAL negative for AFB Echocardiogram 10/07/15: LVEF 60%, Grade I DD, mild MR, RVSP estimate 35 mmHg HRCT 11/04/16: again compatible with interstitial lung disease, and considered diagnostic of usual interstitial pneumonia (UIP) from an imaging standpoint. There has been no significant progression of disease compared to the recent prior examination PFT 02/10/17: no obstruction, normal TLC, moderate reduction in DLCO 6MWT 03/11/17: 345 m. No desaturation PFTs 11/22/17: No obstruction.  Lung volumes normal.  DLCO moderately reduced (55% predicted). [place] since 02/10/17 PFTs 11/23/18 : FVC: 2.67 L (95 %pred), FEV1: 2.35 L (112 %pred), FEV1/FVC: 88%, TLC: 4.98 L (88 %pred), DLCO 42  %pred    INTERVAL: Last encounter was a telephone encounter 01/11/19.  At that time, cough was improving.     SUBJ: This is a scheduled follow-up.  She reports that she continues to have "troublesome" cough particularly at night.  It often awakens her from sleep.  It is mostly nonproductive but causes her to gag.  She takes Robitussin-DM at bedtime but its effect seems to wear off by 3 in the morning.  She has intermittent reflux symptoms.  She reports chronic nasal congestion.  She denies fever, chest pain, purulent sputum, hemoptysis, lower extremity edema, calf tenderness.  Notably, she is on azathioprine and low dose prednisone for RA.  OBJ: Vitals:   04/20/19 0946  BP: 120/70  Pulse: 87  SpO2: 100%  RA  Gen: NAD HEENT: NCAT, sclerae white Neck: No JVD Lungs: L>R basilar crackles, no wheezes Cardiovascular: RRR, no murmurs Abdomen: Soft, nontender, normal BS Ext: without clubbing, cyanosis, edema Neuro: grossly intact Skin: Limited exam, no lesions noted    DATA: BMP Latest Ref Rng & Units 02/28/2019 08/25/2018 02/10/2018  Glucose 70 - 99 mg/dL 111(H) 121(H) -  BUN 6 - 23 mg/dL 17 18 -  Creatinine 0.40 - 1.20 mg/dL 0.75 0.59 -  Sodium 135 - 145 mEq/L 136 135 -  Potassium 3.5 - 5.1 mEq/L 4.4 4.1 -  Chloride 96 - 112 mEq/L 100 103 -  CO2 19 - 32 mEq/L 29 22 -  Calcium 8.4 - 10.5 mg/dL 9.4 8.6(L) 9.6   CBC Latest Ref Rng & Units 08/25/2018 08/25/2017 08/23/2017  WBC 4.0 - 10.5 K/uL 6.8 6.1 7.5  Hemoglobin 12.0 - 15.0 g/dL 11.7(L) 8.9(L) 10.7(L)  Hematocrit 36.0 - 46.0 % 34.2(L) 26.2(L) 30.9(L)  Platelets 150 - 400 K/uL 306 225 236    CXR: No new film  IMPRESSION:   ICD-10-CM  1. Pulmonary fibrosis, likely rheumatoid lung.  History of ARDS.  History of nitrofurantoin use.  Radiographically and physiologically stable  J84.10   2. Chronic cough  R05   3. Suspected GERD  K21.9      Cough which is worse at night (in supine position) likely due to GERD +/-  rhinosinusitis with posterior nasal drainage  PLAN: Change Protonix to 20 mg twice a day Continue famotidine 20 mg daily Flonase 1 spray per nostril daily.  May be taken in the morning or at night Phone visit follow-up in 4-6 weeks  [name], MD PCCM service Mobile [phone] Pager [phone] 04/26/2019 4:41 PM

## 2019-04-26 ENCOUNTER — Encounter: Payer: Self-pay | Admitting: Pulmonary Disease

## 2019-04-26 DIAGNOSIS — Z79899 Other long term (current) drug therapy: Secondary | ICD-10-CM | POA: Diagnosis not present

## 2019-04-26 DIAGNOSIS — M059 Rheumatoid arthritis with rheumatoid factor, unspecified: Secondary | ICD-10-CM | POA: Diagnosis not present

## 2019-05-25 ENCOUNTER — Telehealth: Payer: Self-pay | Admitting: Pulmonary Disease

## 2019-05-25 NOTE — Telephone Encounter (Signed)
Called patient for COVID-19 pre-screening for in office visit. ° °Have you recently traveled any where out of the local area in the last 2 weeks? No ° °Have you been in close contact with a person diagnosed with COVID-19 or someone awaiting results within the last 2 weeks? No ° °Do you currently have any of the following symptoms? If so, when did they start? °Cough     Diarrhea   Joint Pain °Fever      Muscle Pain   Red eyes °Shortness of breath   Abdominal pain  Vomiting °Loss of smell    Rash    Sore Throat °Headache    Weakness   Bruising or bleeding ° ° °Okay to proceed with visit 05/28/2019   ° ° °

## 2019-05-28 ENCOUNTER — Ambulatory Visit
Admission: RE | Admit: 2019-05-28 | Discharge: 2019-05-28 | Disposition: A | Discharge status: Home / Self Care | Payer: Medicare Other | Source: Ambulatory Visit | Attending: Pulmonary Disease | Admitting: Pulmonary Disease

## 2019-05-28 ENCOUNTER — Telehealth: Payer: Self-pay | Admitting: Pulmonary Disease

## 2019-05-28 ENCOUNTER — Other Ambulatory Visit: Payer: Self-pay

## 2019-05-28 ENCOUNTER — Ambulatory Visit (INDEPENDENT_AMBULATORY_CARE_PROVIDER_SITE_OTHER): Payer: Medicare Other | Admitting: Pulmonary Disease

## 2019-05-28 ENCOUNTER — Encounter: Payer: Self-pay | Admitting: Pulmonary Disease

## 2019-05-28 VITALS — BP 116/70 | HR 73 | Temp 97.2°F | Ht 68.0 in | Wt 122.2 lb

## 2019-05-28 DIAGNOSIS — R05 Cough: Secondary | ICD-10-CM | POA: Diagnosis not present

## 2019-05-28 DIAGNOSIS — K219 Gastro-esophageal reflux disease without esophagitis: Secondary | ICD-10-CM

## 2019-05-28 DIAGNOSIS — J329 Chronic sinusitis, unspecified: Secondary | ICD-10-CM

## 2019-05-28 DIAGNOSIS — J8410 Pulmonary fibrosis, unspecified: Secondary | ICD-10-CM

## 2019-05-28 DIAGNOSIS — R053 Chronic cough: Secondary | ICD-10-CM

## 2019-05-28 MED ORDER — BUDESONIDE-FORMOTEROL FUMARATE 80-4.5 MCG/ACT IN AERO: 2.0000 | INHALATION_SPRAY | Freq: Two times a day (BID) | RESPIRATORY_TRACT | 12 refills | Status: DC

## 2019-05-28 MED ORDER — ADVAIR HFA 115-21 MCG/ACT IN AERO: 2.0000 | INHALATION_SPRAY | Freq: Two times a day (BID) | RESPIRATORY_TRACT | 12 refills | Status: DC

## 2019-05-28 MED ORDER — BUDESONIDE-FORMOTEROL FUMARATE 80-4.5 MCG/ACT IN AERO: 2.0000 | INHALATION_SPRAY | Freq: Two times a day (BID) | RESPIRATORY_TRACT | 0 refills | Status: DC

## 2019-05-28 NOTE — Patient Instructions (Signed)
Continue famotidine and Protonix as previously prescribed Continue Flonase as previously prescribed New prescription: Symbicort 80-4.5, two inhalations twice a day.  Rinse mouth after use.  Sample provided and prescription entered ([facility])  Chest x-ray ordered for today  Follow-up with Dr. Patsey Berthold in 6 weeks

## 2019-05-28 NOTE — Telephone Encounter (Signed)
Received medication change request from [facility].  Covered alternative for symbicort 80 is advirdiskus and advair HFA.   Dr. Alva Garnet please advise if okay to switch?

## 2019-05-28 NOTE — Telephone Encounter (Signed)
Called and spoke to pt, who is requesting that I contact [facility] to hold Rx for advair.  I have contacted [facility] and requested that they do so. Nothing further is needed.

## 2019-05-28 NOTE — Telephone Encounter (Signed)
Rx sent in for [place]

## 2019-05-28 NOTE — Progress Notes (Signed)
PULMONARY OFFICE FOLLOW UP NOTE  PROBLEMS: History of prior ARDS 1999 Prior long term nitrofurantoin therapy Pulmonary fibrosis Bronchiectasis Mild COPD  PT PROFILE: 85 y.o. F with remote minimal smoking history (15 PY, quit 1982) initially evaluated by Dr Stevenson Clinch 03/2016 and subsequently by Dr Chase Caller for pulmonary fibrosis. Pt has prior history of severe ARDS (1999) and has been on chronic nitrofurantoin for recurrent UTIs. She has been tried on 2 maintenance inhalers without discernible benefit.   DATA: CT chest 03/22/16: Generalized pulmonary hyperinflation but without bullous emphysema. Widespread bronchiectasis with bronchial wall thickening consistent with inflammatory bronchitis. Areas of pulmonary fibrosis, most pronounced in the lower lobes, where the bronchiectasis is more extensive, including areas of pulmonary lung destruction HRCT chest 06/11/16: Diffuse cylindrical and varicoid bronchiectasis throughout both lungs, most severe at the lung bases. Basilar predominant fibrotic interstitial lung disease with patchy honeycombing, with mild progression in the short interval since 03/22/2016 Spirometry 06/18/16: Mild obstruction, FEV1 1.42 liters (63%), FVC 2.43 (80%) Bronchoscopy 07/05/16: Normal airway exam. BAL negative for AFB Echocardiogram 10/07/15: LVEF 60%, Grade I DD, mild MR, RVSP estimate 35 mmHg HRCT 11/04/16: again compatible with interstitial lung disease, and considered diagnostic of usual interstitial pneumonia (UIP) from an imaging standpoint. There has been no significant progression of disease compared to the recent prior examination PFT 02/10/17: no obstruction, normal TLC, moderate reduction in DLCO 6MWT 03/11/17: 345 m. No desaturation PFTs 11/22/17: No obstruction.  Lung volumes normal.  DLCO moderately reduced (55% predicted). [place] since 02/10/17 PFTs 11/23/18 : FVC: 2.67 L (95 %pred), FEV1: 2.35 L (112 %pred), FEV1/FVC: 88%, TLC: 4.98 L (88 %pred), DLCO 42  %pred    INTERVAL: Last visit 04/20/19.  At that time, she complained of increased cough, especially at night while supine.  Protonix was changed to 20 mg twice daily and famotidine was continued.  Flonase was initiated.   SUBJ: This is a scheduled follow-up.  She is using pantoprazole twice a day and famotidine. GERD symptoms are improved but it is not helping her cough. She also remains on Flonase as prescribed. Her cough persists, mostly at night and often awakens her from sleep. Cough is productive of minimum clear mucus. Dextromethorphan does not help. She denies CP, fever, purulent sputum, hemoptysis, LE edema and calf tenderness.  OBJ: Vitals:   05/28/19 1017  BP: 116/70  Pulse: 73  Temp: (!) 97.2 F (36.2 C)  TempSrc: Temporal  SpO2: 98%  Weight: 122 lb 3.2 oz (55.4 kg)  Height: 5\' 8"  (1.727 m)  RA  Gen: NAD HEENT: NCAT, sclerae white Neck: No JVD Lungs: bibasilar crackles Cardiovascular: RRR, no murmurs Abdomen: Soft, nontender, normal BS Ext: without clubbing, cyanosis, edema Neuro: grossly intact Skin: Limited exam, no lesions noted     DATA: BMP Latest Ref Rng & Units 02/28/2019 08/25/2018 02/10/2018  Glucose 70 - 99 mg/dL 111(H) 121(H) -  BUN 6 - 23 mg/dL 17 18 -  Creatinine 0.40 - 1.20 mg/dL 0.75 0.59 -  Sodium 135 - 145 mEq/L 136 135 -  Potassium 3.5 - 5.1 mEq/L 4.4 4.1 -  Chloride 96 - 112 mEq/L 100 103 -  CO2 19 - 32 mEq/L 29 22 -  Calcium 8.4 - 10.5 mg/dL 9.4 8.6(L) 9.6   CBC Latest Ref Rng & Units 08/25/2018 08/25/2017 08/23/2017  WBC 4.0 - 10.5 K/uL 6.8 6.1 7.5  Hemoglobin 12.0 - 15.0 g/dL 11.7(L) 8.9(L) 10.7(L)  Hematocrit 36.0 - 46.0 % 34.2(L) 26.2(L) 30.9(L)  Platelets 150 - 400 K/uL  306 225 236    CXR 08/24: No significant change in predominantly bibasilar fibrosis  IMPRESSION:   ICD-10-CM   1. Pulmonary fibrosis, likely rheumatoid lung.  History of ARDS.  History of nitrofurantoin use.  Radiographically and physiologically stable   J84.10   2. Chronic cough  R05   3. Suspected GERD  K21.9   4. Chronic rhinosinusitis  J32.9        PLAN: Continue famotidine and Protonix as previously prescribed Continue Flonase as previously prescribed New prescription: Advair HFA 115, two inhalations twice a day.  Rinse mouth after use. Prescription entered ([facility])  Chest x-ray ordered for today - reviewed above  Follow-up with Dr. Patsey Berthold in 6 weeks   [name], MD PCCM service Mobile [phone] Pager [phone] 05/28/2019 10:21 AM

## 2019-05-28 NOTE — Telephone Encounter (Signed)
Pt is aware of below message and voiced her understanding. Nothing further is needed. 

## 2019-05-31 DIAGNOSIS — M19049 Primary osteoarthritis, unspecified hand: Secondary | ICD-10-CM | POA: Diagnosis not present

## 2019-05-31 DIAGNOSIS — M059 Rheumatoid arthritis with rheumatoid factor, unspecified: Secondary | ICD-10-CM | POA: Diagnosis not present

## 2019-05-31 DIAGNOSIS — M810 Age-related osteoporosis without current pathological fracture: Secondary | ICD-10-CM | POA: Diagnosis not present

## 2019-05-31 DIAGNOSIS — J849 Interstitial pulmonary disease, unspecified: Secondary | ICD-10-CM | POA: Diagnosis not present

## 2019-05-31 DIAGNOSIS — Z79899 Other long term (current) drug therapy: Secondary | ICD-10-CM | POA: Diagnosis not present

## 2019-06-21 ENCOUNTER — Ambulatory Visit: Payer: Medicare Other

## 2019-07-04 ENCOUNTER — Ambulatory Visit: Payer: Medicare Other | Admitting: Pulmonary Disease

## 2019-07-04 ENCOUNTER — Other Ambulatory Visit: Payer: Self-pay

## 2019-07-04 ENCOUNTER — Encounter: Payer: Self-pay | Admitting: Pulmonary Disease

## 2019-07-04 VITALS — BP 124/70 | HR 78 | Temp 97.8°F | Ht 68.0 in | Wt 124.8 lb

## 2019-07-04 DIAGNOSIS — R059 Cough, unspecified: Secondary | ICD-10-CM

## 2019-07-04 MED ORDER — SPIRIVA RESPIMAT 2.5 MCG/ACT IN AERS: 2.0000 | INHALATION_SPRAY | Freq: Every day | RESPIRATORY_TRACT | 0 refills | Status: DC

## 2019-07-04 MED ORDER — OLOPATADINE HCL 0.6 % NA SOLN: 2.0000 | Freq: Two times a day (BID) | NASAL | 1 refills | Status: DC

## 2019-07-04 NOTE — Patient Instructions (Addendum)
1.  Take your Protonix 2 tablets in the morning (total 40 mg)  2.  Take Pepcid 1 tablet (20 mg) at bedtime  3.  Continue following antireflux measures as you are doing  4.  Continue Zyrtec for now  5.  We will give you a trial of Spiriva 2 inhalations daily to see if this helps your cough.  6.  We will see you in follow-up in 4 weeks time call sooner should any new difficulties arise or symptoms worsen in the interim.

## 2019-07-11 ENCOUNTER — Other Ambulatory Visit: Payer: Self-pay | Admitting: Primary Care

## 2019-07-11 DIAGNOSIS — G4700 Insomnia, unspecified: Secondary | ICD-10-CM

## 2019-07-13 MED ORDER — SPIRIVA RESPIMAT 2.5 MCG/ACT IN AERS: 2.0000 | INHALATION_SPRAY | Freq: Every day | RESPIRATORY_TRACT | 0 refills | Status: DC

## 2019-07-13 NOTE — Telephone Encounter (Signed)
Last prescribed on 07/19/2018. Last appointment on 01/18/2019 . N future appointment

## 2019-07-13 NOTE — Telephone Encounter (Signed)
Noted, refills sent to pharmacy. 

## 2019-08-01 ENCOUNTER — Encounter: Payer: Self-pay | Admitting: Pulmonary Disease

## 2019-08-01 ENCOUNTER — Ambulatory Visit: Payer: Medicare Other | Admitting: Pulmonary Disease

## 2019-08-01 ENCOUNTER — Other Ambulatory Visit: Payer: Self-pay

## 2019-08-01 VITALS — BP 108/68 | HR 75 | Temp 97.3°F | Ht 68.0 in | Wt 123.6 lb

## 2019-08-01 DIAGNOSIS — J8410 Pulmonary fibrosis, unspecified: Secondary | ICD-10-CM

## 2019-08-01 DIAGNOSIS — J329 Chronic sinusitis, unspecified: Secondary | ICD-10-CM

## 2019-08-01 DIAGNOSIS — R053 Chronic cough: Secondary | ICD-10-CM

## 2019-08-01 DIAGNOSIS — R05 Cough: Secondary | ICD-10-CM

## 2019-08-01 DIAGNOSIS — M0510 Rheumatoid lung disease with rheumatoid arthritis of unspecified site: Secondary | ICD-10-CM

## 2019-08-01 NOTE — Patient Instructions (Signed)
1.  Continue Spiriva 2 puffs every morning.  2.  Continue pantoprazole and famotidine as you are now taking.  3.  You may discontinue the nasal spray for now a resume using it if allergy comes worsen.  4.  We will see you in follow-up in 3 months time.

## 2019-08-01 NOTE — Progress Notes (Signed)
    Assessment & Plan:  1. Pulmonary fibrosis, likely rheumatoid lung.  History of ARDS.  History of nitrofurantoin  use.  Radiographically and physiologically stable (Primary)  2. Chronic cough  3. Chronic rhinosinusitis  4. Rheumatoid lung (HCC)   Patient Instructions  1.  Continue Spiriva  2 puffs every morning.  2.  Continue pantoprazole  and famotidine  as you are now taking.  3.  You may discontinue the nasal spray for now a resume using it if allergy comes worsen.  4.  We will see you in follow-up in 3 months time.  Please note: late entry documentation due to logistical difficulties during COVID-19 pandemic. This note is filed for information purposes only, and is not intended to be used for billing, nor does it represent the full scope/nature of the visit in question. Please see any associated scanned media linked to date of encounter for additional pertinent information.  Subjective:    HPI: Crystal Gregory is a 85 y.o. female presenting to the pulmonology clinic on 08/01/2019 with report of: Follow-up (Trial of Spiriva . She has noticed an improvement using the spiriva . )     Outpatient Encounter Medications as of 08/01/2019  Medication Sig   denosumab  (PROLIA ) 60 MG/ML SOLN injection Inject 60 mg into the skin every 6 (six) months. Administer in upper arm, thigh, or abdomen (Patient not taking: Reported on 06/27/2024)   [DISCONTINUED] azaTHIOprine  (IMURAN ) 50 MG tablet Take 75 mg every morning and 50 mg every evening.   [DISCONTINUED] famotidine  (PEPCID ) 20 MG tablet Take 1 tablet (20 mg total) by mouth daily. Each morning   [DISCONTINUED] Olopatadine  HCl 0.6 % SOLN Place 2 sprays into the nose 2 (two) times daily.   [DISCONTINUED] pantoprazole  (PROTONIX ) 20 MG tablet Take 1 tablet (20 mg total) by mouth 2 (two) times daily. (Patient taking differently: Take 40 mg by mouth daily before breakfast. )   [DISCONTINUED] predniSONE  (DELTASONE ) 1 MG tablet Take 2 mg by mouth  daily with breakfast.   [DISCONTINUED] propranolol  (INDERAL ) 10 MG tablet TAKE 1 TABLET TWICE A DAY for tremor.   [DISCONTINUED] Tiotropium Bromide Monohydrate  (SPIRIVA  RESPIMAT) 2.5 MCG/ACT AERS Inhale 2 puffs into the lungs daily.   [DISCONTINUED] traZODone  (DESYREL ) 50 MG tablet TAKE 1 TABLET AT BEDTIME FOR SLEEP   [DISCONTINUED] nitroGLYCERIN  (NITROSTAT ) 0.4 MG SL tablet Place 1 tablet (0.4 mg total) under the tongue every 5 (five) minutes as needed for chest pain. Maximum 3 doses.   No facility-administered encounter medications on file as of 08/01/2019.      Objective:   Vitals:   08/01/19 0954  BP: 108/68  Pulse: 75  Temp: (!) 97.3 F (36.3 C)  Height: 5' 8 (1.727 m)  Weight: 123 lb 9.6 oz (56.1 kg)  SpO2: 99%  TempSrc: Temporal  BMI (Calculated): 18.8     Physical exam documentation is limited by delayed entry of information.

## 2019-08-09 ENCOUNTER — Telehealth: Payer: Self-pay

## 2019-08-09 NOTE — Telephone Encounter (Signed)
LMOM.  Need to discuss Prolia benefits w/pt. [place]

## 2019-08-10 ENCOUNTER — Telehealth: Payer: Self-pay

## 2019-08-10 NOTE — Telephone Encounter (Signed)
Discussed Prolia benefits w/pt.  Pt owes $0.  Pt understands and agrees.

## 2019-08-29 DIAGNOSIS — M059 Rheumatoid arthritis with rheumatoid factor, unspecified: Secondary | ICD-10-CM | POA: Diagnosis not present

## 2019-08-29 DIAGNOSIS — Z79899 Other long term (current) drug therapy: Secondary | ICD-10-CM | POA: Diagnosis not present

## 2019-09-13 ENCOUNTER — Other Ambulatory Visit: Payer: Self-pay

## 2019-09-13 ENCOUNTER — Ambulatory Visit (INDEPENDENT_AMBULATORY_CARE_PROVIDER_SITE_OTHER): Payer: Medicare Other

## 2019-09-13 DIAGNOSIS — M810 Age-related osteoporosis without current pathological fracture: Secondary | ICD-10-CM

## 2019-09-13 MED ORDER — DENOSUMAB 60 MG/ML ~~LOC~~ SOSY
60.0000 mg | PREFILLED_SYRINGE | Freq: Once | SUBCUTANEOUS | Status: AC
  Administered 2019-09-13: 60 mg via SUBCUTANEOUS

## 2019-09-13 NOTE — Progress Notes (Signed)
Pt given Prolia injection in Right Arm today. Tolerated well. Given sticker for reminder of next injection due date.

## 2019-09-24 NOTE — Progress Notes (Signed)
Approve

## 2019-09-25 ENCOUNTER — Telehealth: Payer: Self-pay | Admitting: Primary Care

## 2019-09-25 DIAGNOSIS — G250 Essential tremor: Secondary | ICD-10-CM

## 2019-10-08 NOTE — Telephone Encounter (Signed)
Patient called She received the letter that she needed to schedule a follow up visit for any future refills. Patient did schedule but was not sure about coming out because she lives in [place]. And they are trying to keep their residents as safe as possible and would like to keep them to stay there if possible./ Patient was not sure if she really needed this appt .

## 2019-10-08 NOTE — Telephone Encounter (Signed)
Spoken and notified patient of [name] comments. Patient verbalized understanding.  Went ahead and cancel the appointment. It was meant to schedule when she needs the next refill

## 2019-10-08 NOTE — Telephone Encounter (Signed)
Please notify patient that I do not need to see her until April 2021. Crystal Gregory, was there another reason she needs to be seen prior to April 2021?  Also in the letter that was sent out there is a typo, the word "require" should be " required".

## 2019-10-08 NOTE — Telephone Encounter (Signed)
Appointment have been cancel. Patient will call back in 01/2020 to schedule

## 2019-10-22 DIAGNOSIS — Z23 Encounter for immunization: Secondary | ICD-10-CM | POA: Diagnosis not present

## 2019-10-23 ENCOUNTER — Ambulatory Visit: Payer: Medicare Other | Admitting: Primary Care

## 2019-10-23 DIAGNOSIS — G4700 Insomnia, unspecified: Secondary | ICD-10-CM

## 2019-10-23 MED ORDER — TRAZODONE HCL 50 MG PO TABS: ORAL_TABLET | ORAL | 0 refills | Status: DC

## 2019-10-30 DIAGNOSIS — M059 Rheumatoid arthritis with rheumatoid factor, unspecified: Secondary | ICD-10-CM | POA: Diagnosis not present

## 2019-10-30 DIAGNOSIS — J849 Interstitial pulmonary disease, unspecified: Secondary | ICD-10-CM | POA: Diagnosis not present

## 2019-10-30 DIAGNOSIS — Z79899 Other long term (current) drug therapy: Secondary | ICD-10-CM | POA: Diagnosis not present

## 2019-10-31 ENCOUNTER — Other Ambulatory Visit: Payer: Self-pay

## 2019-10-31 ENCOUNTER — Ambulatory Visit: Payer: Medicare Other | Admitting: Pulmonary Disease

## 2019-10-31 ENCOUNTER — Encounter: Payer: Self-pay | Admitting: Pulmonary Disease

## 2019-10-31 VITALS — BP 126/68 | HR 74 | Temp 96.4°F | Ht 68.0 in | Wt 125.8 lb

## 2019-10-31 DIAGNOSIS — R053 Chronic cough: Secondary | ICD-10-CM

## 2019-10-31 DIAGNOSIS — J8410 Pulmonary fibrosis, unspecified: Secondary | ICD-10-CM

## 2019-10-31 DIAGNOSIS — R0602 Shortness of breath: Secondary | ICD-10-CM

## 2019-10-31 DIAGNOSIS — M0510 Rheumatoid lung disease with rheumatoid arthritis of unspecified site: Secondary | ICD-10-CM

## 2019-10-31 DIAGNOSIS — R05 Cough: Secondary | ICD-10-CM

## 2019-10-31 MED ORDER — TRELEGY ELLIPTA 100-62.5-25 MCG/INH IN AEPB: 1.0000 | INHALATION_SPRAY | Freq: Every day | RESPIRATORY_TRACT | 11 refills | Status: DC

## 2019-10-31 NOTE — Patient Instructions (Signed)
Stop Spiriva  We are giving you a trial of Trelegy Ellipta 1 inhalation daily  We are checking an echocardiogram to see how you are artery going from the heart to the lungs is doing and make sure you do not have pulmonary hypertension  We will see you in follow-up in 4 to 6 weeks time

## 2019-10-31 NOTE — Progress Notes (Signed)
    Assessment & Plan:  1. Pulmonary fibrosis, likely rheumatoid lung.  History of ARDS.  History of nitrofurantoin  use.  Radiographically and physiologically stable (Primary)  2. Rheumatoid lung (HCC)  3. Shortness of breath - ECHOCARDIOGRAM COMPLETE; Future  4. Chronic cough   Patient Instructions  Stop Spiriva   We are giving you a trial of Trelegy Ellipta  1 inhalation daily  We are checking an echocardiogram to see how you are artery going from the heart to the lungs is doing and make sure you do not have pulmonary hypertension  We will see you in follow-up in 4 to 6 weeks time  Please note: late entry documentation due to logistical difficulties during COVID-19 pandemic. This note is filed for information purposes only, and is not intended to be used for billing, nor does it represent the full scope/nature of the visit in question. Please see any associated scanned media linked to date of encounter for additional pertinent information.  Subjective:    HPI: Crystal Gregory is a 86 y.o. female presenting to the pulmonology clinic on 10/31/2019 with report of: Follow-up (Pt states she has been feeling more SOB. Pt states she still coughs occ. Denies any wheezing, fever, or chills.)     Outpatient Encounter Medications as of 10/31/2019  Medication Sig   denosumab  (PROLIA ) 60 MG/ML SOLN injection Inject 60 mg into the skin every 6 (six) months. Administer in upper arm, thigh, or abdomen (Patient not taking: Reported on 06/27/2024)   [DISCONTINUED] azaTHIOprine  (IMURAN ) 50 MG tablet Take 75 mg every morning and 50 mg every evening.   [DISCONTINUED] famotidine  (PEPCID ) 20 MG tablet Take 1 tablet (20 mg total) by mouth daily. Each morning   [DISCONTINUED] Olopatadine  HCl 0.6 % SOLN Place 2 sprays into the nose 2 (two) times daily.   [DISCONTINUED] pantoprazole  (PROTONIX ) 20 MG tablet Take 1 tablet (20 mg total) by mouth 2 (two) times daily. (Patient taking differently: Take 40 mg by  mouth daily before breakfast. )   [DISCONTINUED] predniSONE  (DELTASONE ) 1 MG tablet Take 2 mg by mouth daily with breakfast.   [DISCONTINUED] propranolol  (INDERAL ) 10 MG tablet TAKE 1 TABLET TWICE A DAY FOR TREMOR (NEED APPOINTMENT FOR FURTHER REFILLS. PLEASE CALL OUR OFFICE TO MAKE AN APPOINTMENT)   [DISCONTINUED] Tiotropium Bromide Monohydrate  (SPIRIVA  RESPIMAT) 2.5 MCG/ACT AERS Inhale 2 puffs into the lungs daily.   [DISCONTINUED] traZODone  (DESYREL ) 50 MG tablet TAKE 1 TABLET AT BEDTIME FOR SLEEP   [DISCONTINUED] Fluticasone -Umeclidin-Vilant (TRELEGY ELLIPTA ) 100-62.5-25 MCG/INH AEPB Inhale 1 puff into the lungs daily.   [DISCONTINUED] nitroGLYCERIN  (NITROSTAT ) 0.4 MG SL tablet Place 1 tablet (0.4 mg total) under the tongue every 5 (five) minutes as needed for chest pain. Maximum 3 doses.   No facility-administered encounter medications on file as of 10/31/2019.      Objective:   Vitals:   10/31/19 0904  BP: 126/68  Pulse: 74  Temp: (!) 96.4 F (35.8 C)  Height: 5' 8 (1.727 m)  Weight: 125 lb 12.8 oz (57.1 kg)  SpO2: 98% Comment: on ra  TempSrc: Temporal  BMI (Calculated): 19.13     Physical exam documentation is limited by delayed entry of information.

## 2019-11-16 ENCOUNTER — Ambulatory Visit
Admission: RE | Admit: 2019-11-16 | Discharge: 2019-11-16 | Disposition: A | Discharge status: Home / Self Care | Payer: Medicare Other | Source: Ambulatory Visit | Attending: Pulmonary Disease | Admitting: Pulmonary Disease

## 2019-11-16 ENCOUNTER — Other Ambulatory Visit: Payer: Self-pay

## 2019-11-16 DIAGNOSIS — J8410 Pulmonary fibrosis, unspecified: Secondary | ICD-10-CM | POA: Diagnosis not present

## 2019-11-16 DIAGNOSIS — I071 Rheumatic tricuspid insufficiency: Secondary | ICD-10-CM | POA: Diagnosis not present

## 2019-11-16 DIAGNOSIS — R0600 Dyspnea, unspecified: Secondary | ICD-10-CM | POA: Diagnosis not present

## 2019-11-16 DIAGNOSIS — J849 Interstitial pulmonary disease, unspecified: Secondary | ICD-10-CM | POA: Insufficient documentation

## 2019-11-16 DIAGNOSIS — R0602 Shortness of breath: Secondary | ICD-10-CM

## 2019-11-16 NOTE — Progress Notes (Signed)
*  PRELIMINARY RESULTS* Echocardiogram 2D Echocardiogram has been performed.  Crystal Gregory 11/16/2019, 10:39 AM

## 2019-11-19 ENCOUNTER — Telehealth: Payer: Self-pay | Admitting: Primary Care

## 2019-11-19 DIAGNOSIS — Z23 Encounter for immunization: Secondary | ICD-10-CM | POA: Diagnosis not present

## 2019-11-19 NOTE — Progress Notes (Signed)
  Chronic Care Management   Outreach Note  11/19/2019 Name: Crystal Gregory MRN: [id number] DOB: Apr 27, 1932  Referred by: Pleas Koch, NP Reason for referral : No chief complaint on file.   An unsuccessful telephone outreach was attempted today. The patient was referred to the pharmacist for assistance with care management and care coordination.   Follow Up Plan:   [name] [employer] Scheduler

## 2019-11-29 DIAGNOSIS — M059 Rheumatoid arthritis with rheumatoid factor, unspecified: Secondary | ICD-10-CM | POA: Diagnosis not present

## 2019-11-29 DIAGNOSIS — Z79899 Other long term (current) drug therapy: Secondary | ICD-10-CM | POA: Diagnosis not present

## 2019-11-30 ENCOUNTER — Encounter: Payer: Self-pay | Admitting: Pulmonary Disease

## 2019-11-30 ENCOUNTER — Ambulatory Visit: Payer: Medicare Other | Admitting: Pulmonary Disease

## 2019-11-30 ENCOUNTER — Other Ambulatory Visit: Payer: Self-pay

## 2019-11-30 VITALS — BP 128/70 | HR 82 | Temp 97.5°F | Ht 68.0 in | Wt 124.6 lb

## 2019-11-30 DIAGNOSIS — J849 Interstitial pulmonary disease, unspecified: Secondary | ICD-10-CM

## 2019-11-30 DIAGNOSIS — J479 Bronchiectasis, uncomplicated: Secondary | ICD-10-CM

## 2019-11-30 DIAGNOSIS — J3089 Other allergic rhinitis: Secondary | ICD-10-CM

## 2019-11-30 MED ORDER — FLUTICASONE PROPIONATE HFA 110 MCG/ACT IN AERO: 2.0000 | INHALATION_SPRAY | Freq: Two times a day (BID) | RESPIRATORY_TRACT | 2 refills | Status: DC

## 2019-11-30 MED ORDER — MONTELUKAST SODIUM 10 MG PO TABS: 10.0000 mg | ORAL_TABLET | Freq: Every day | ORAL | 2 refills | Status: DC

## 2019-11-30 NOTE — Patient Instructions (Addendum)
We will try Singulair 1 tablet daily.  Also a trial of Flovent HFA 2 sprays twice a day.  Make sure you rinse your mouth well after use.  We will see you in follow-up in 2 months time.  Let us know how you are doing with the inhaler and the tablet and we can change the prescription from your pharmacy to [facility].

## 2019-11-30 NOTE — Progress Notes (Signed)
    Assessment & Plan:  1. ILD (interstitial lung disease) (HCC) (Primary)  2. Bronchiectasis without complication (HCC)  3. Perennial allergic rhinitis   Patient Instructions  We will try Singulair  1 tablet daily.  Also a trial of Flovent  HFA 2 sprays twice a day.  Make sure you rinse your mouth well after use.  We will see you in follow-up in 2 months time.  Let us  know how you are doing with the inhaler and the tablet and we can change the prescription from your pharmacy to [facility].    Please note: late entry documentation due to logistical difficulties during COVID-19 pandemic. This note is filed for information purposes only, and is not intended to be used for billing, nor does it represent the full scope/nature of the visit in question. Please see any associated scanned media linked to date of encounter for additional pertinent information.  Subjective:    HPI: Crystal Gregory is a 86 y.o. female presenting to the pulmonology clinic on 11/30/2019 with report of: Follow-up (Pt states she has been feeling about the same since last ov. SOB on exertion. Occ cough. Pt denies wheezing, fever, chills, or sweats.)     Outpatient Encounter Medications as of 11/30/2019  Medication Sig   denosumab  (PROLIA ) 60 MG/ML SOLN injection Inject 60 mg into the skin every 6 (six) months. Administer in upper arm, thigh, or abdomen (Patient not taking: Reported on 06/27/2024)   [DISCONTINUED] azaTHIOprine  (IMURAN ) 50 MG tablet Take 75 mg every morning and 50 mg every evening.   [DISCONTINUED] Fluticasone -Umeclidin-Vilant (TRELEGY ELLIPTA ) 100-62.5-25 MCG/INH AEPB Inhale 1 puff into the lungs daily.   [DISCONTINUED] pantoprazole  (PROTONIX ) 20 MG tablet Take 1 tablet (20 mg total) by mouth 2 (two) times daily. (Patient taking differently: Take 40 mg by mouth daily before breakfast. )   [DISCONTINUED] predniSONE  (DELTASONE ) 1 MG tablet Take 2 mg by mouth daily with breakfast.   [DISCONTINUED]  propranolol  (INDERAL ) 10 MG tablet TAKE 1 TABLET TWICE A DAY FOR TREMOR (NEED APPOINTMENT FOR FURTHER REFILLS. PLEASE CALL OUR OFFICE TO MAKE AN APPOINTMENT)   [DISCONTINUED] traZODone  (DESYREL ) 50 MG tablet TAKE 1 TABLET AT BEDTIME FOR SLEEP   [DISCONTINUED] fluticasone  (FLOVENT  HFA) 110 MCG/ACT inhaler Inhale 2 puffs into the lungs 2 (two) times daily.   [DISCONTINUED] montelukast  (SINGULAIR ) 10 MG tablet Take 1 tablet (10 mg total) by mouth daily.   [DISCONTINUED] nitroGLYCERIN  (NITROSTAT ) 0.4 MG SL tablet Place 1 tablet (0.4 mg total) under the tongue every 5 (five) minutes as needed for chest pain. Maximum 3 doses.   [DISCONTINUED] Tiotropium Bromide Monohydrate  (SPIRIVA  RESPIMAT) 2.5 MCG/ACT AERS Inhale 2 puffs into the lungs daily.   No facility-administered encounter medications on file as of 11/30/2019.      Objective:   Vitals:   11/30/19 0951  BP: 128/70  Pulse: 82  Temp: (!) 97.5 F (36.4 C)  Height: 5' 8 (1.727 m)  Weight: 124 lb 9.6 oz (56.5 kg)  SpO2: 99% Comment: on ra  TempSrc: Temporal  BMI (Calculated): 18.95     Physical exam documentation is limited by delayed entry of information.

## 2019-12-04 DIAGNOSIS — E538 Deficiency of other specified B group vitamins: Secondary | ICD-10-CM | POA: Diagnosis not present

## 2019-12-04 DIAGNOSIS — D7589 Other specified diseases of blood and blood-forming organs: Secondary | ICD-10-CM | POA: Diagnosis not present

## 2019-12-04 DIAGNOSIS — D649 Anemia, unspecified: Secondary | ICD-10-CM | POA: Diagnosis not present

## 2019-12-05 DIAGNOSIS — J849 Interstitial pulmonary disease, unspecified: Secondary | ICD-10-CM

## 2019-12-05 DIAGNOSIS — J84112 Idiopathic pulmonary fibrosis: Secondary | ICD-10-CM

## 2019-12-05 DIAGNOSIS — K219 Gastro-esophageal reflux disease without esophagitis: Secondary | ICD-10-CM

## 2019-12-05 MED ORDER — PANTOPRAZOLE SODIUM 20 MG PO TBEC: 20.0000 mg | DELAYED_RELEASE_TABLET | Freq: Two times a day (BID) | ORAL | 0 refills | Status: DC

## 2020-01-09 ENCOUNTER — Encounter: Payer: Self-pay | Admitting: Primary Care

## 2020-01-09 ENCOUNTER — Other Ambulatory Visit: Payer: Self-pay

## 2020-01-09 ENCOUNTER — Ambulatory Visit (INDEPENDENT_AMBULATORY_CARE_PROVIDER_SITE_OTHER): Payer: Medicare Other | Admitting: Primary Care

## 2020-01-09 VITALS — BP 126/72 | HR 80 | Temp 95.7°F | Ht 62.0 in | Wt 124.0 lb

## 2020-01-09 DIAGNOSIS — G4700 Insomnia, unspecified: Secondary | ICD-10-CM

## 2020-01-09 DIAGNOSIS — M069 Rheumatoid arthritis, unspecified: Secondary | ICD-10-CM

## 2020-01-09 DIAGNOSIS — G250 Essential tremor: Secondary | ICD-10-CM | POA: Diagnosis not present

## 2020-01-09 DIAGNOSIS — J849 Interstitial pulmonary disease, unspecified: Secondary | ICD-10-CM

## 2020-01-09 DIAGNOSIS — J84112 Idiopathic pulmonary fibrosis: Secondary | ICD-10-CM

## 2020-01-09 DIAGNOSIS — K219 Gastro-esophageal reflux disease without esophagitis: Secondary | ICD-10-CM

## 2020-01-09 DIAGNOSIS — M810 Age-related osteoporosis without current pathological fracture: Secondary | ICD-10-CM

## 2020-01-09 LAB — BASIC METABOLIC PANEL
BUN: 13 mg/dL (ref 6–23)
CO2: 30 mEq/L (ref 19–32)
Calcium: 8.8 mg/dL (ref 8.4–10.5)
Chloride: 102 mEq/L (ref 96–112)
Creatinine, Ser: 0.77 mg/dL (ref 0.40–1.20)
GFR: 71.07 mL/min (ref >60.00)
Glucose, Bld: 85 mg/dL (ref 70–99)
Potassium: 4.4 mEq/L (ref 3.5–5.1)
Sodium: 139 mEq/L (ref 135–145)

## 2020-01-09 MED ORDER — PROPRANOLOL HCL 10 MG PO TABS: ORAL_TABLET | ORAL | 3 refills | Status: DC

## 2020-01-09 MED ORDER — TRAZODONE HCL 50 MG PO TABS: ORAL_TABLET | ORAL | 3 refills | Status: DC

## 2020-01-09 MED ORDER — PANTOPRAZOLE SODIUM 20 MG PO TBEC: 20.0000 mg | DELAYED_RELEASE_TABLET | Freq: Two times a day (BID) | ORAL | 3 refills | Status: DC

## 2020-01-09 NOTE — Assessment & Plan Note (Signed)
Repeat bone density scan pending. Repeat BMP pending with calcium.  Due for Prolia injection this Summer.

## 2020-01-09 NOTE — Assessment & Plan Note (Signed)
Well controlled on pantoprazole 20 mg BID, continue same. Refills provided.

## 2020-01-09 NOTE — Assessment & Plan Note (Signed)
Following with pulmonology.  Now managed on Flovent, Singulair, albuterol PRN.

## 2020-01-09 NOTE — Patient Instructions (Signed)
Call the [facility] to schedule your bone density scan.  Stop by the lab prior to leaving today. I will notify you of your results once received.   It was a pleasure to see you today!

## 2020-01-09 NOTE — Assessment & Plan Note (Signed)
Following with rheumatology, compliant to Imuran and prednisone 2-3 mg.

## 2020-01-09 NOTE — Assessment & Plan Note (Signed)
Doing well on Trazodone 50 mg, continue same. Refills provided.

## 2020-01-09 NOTE — Progress Notes (Signed)
Subjective:    Patient ID: Crystal Gregory, female    DOB: [birth year], 86 y.o.   MRN: [id number]  HPI  This visit occurred during the SARS-CoV-2 public health emergency.  Safety protocols were in place, including screening questions prior to the visit, additional usage of staff PPE, and extensive cleaning of exam room while observing appropriate contact time as indicated for disinfecting solutions.   Crystal Gregory is a 86 year old female with a medical history of IPF, ILD, CAP, GERD, RA, insomnia, who presents today for medication refills and follow up.  1) Insomnia: Currently managed on Trazodone 50 mg for which she takes every night for sleep, does very well on this regimen.   2) GERD: Currently managed on pantoprazole 20 mg BID for which she's taken for years. She's tried weaning down and also with other PPI's, but couldn't come off. She is doing well on pantoprazole 20 mg BID.  3) Tremor: Chronic to bilateral hands. Managed on propranolol 10 mg BID for which she has taken for years. Overall feels well managed on this regimen.    BP Readings from Last 3 Encounters:  01/09/20 126/72  11/30/19 128/70  10/31/19 126/68     Review of Systems  Respiratory:       Intermittent shortness of breath, chronic.  Cardiovascular: Negative for chest pain.  Gastrointestinal:       Intermittent esophageal reflux  Musculoskeletal: Positive for arthralgias and joint swelling.  Psychiatric/Behavioral: Negative for sleep disturbance.       Past Medical History:  Diagnosis Date  . Allergy   . CAP (community acquired pneumonia) 08/23/2017  . Chest pain    a. 09/04/2018 MV: EF >65%. No ischemia/infarct.  . Coronary artery calcification seen on CT scan   . Diastolic dysfunction    a. 10/2016 Echo: EF 60-65%, no rwma, Gr1 DD, mild MR, nl RV fxn, mild to mod TR, PASP 68mmHg.  Marland Kitchen Dyspnea   . GERD (gastroesophageal reflux disease)   . Interstitial lung disorders ([place])   . Pulmonary fibrosis ([place])   .  Septicemia ([place]) 1999   spent 4 weeks in ICU, intubated -? PNA     Social History   Socioeconomic History  . Marital status: Married    Spouse name: Not on file  . Number of children: 2  . Years of education: Not on file  . Highest education level: Not on file  Occupational History  . Occupation: Retired   Tobacco Use  . Smoking status: Former Smoker    Packs/day: 0.50    Years: 30.00    Pack years: 15.00    Quit date: 01/16/1981    Years since quitting: 39.0  . Smokeless tobacco: Never Used  Substance and Sexual Activity  . Alcohol use: Yes    Comment: 2 drinks daily   . Drug use: No  . Sexual activity: Not on file  Other Topics Concern  . Not on file  Social History Narrative   Married.     2 children.  Lives with her husband in [place].   Social Determinants of Health   Financial Resource Strain:   . Difficulty of Paying Living Expenses:   Food Insecurity:   . Worried About Charity fundraiser in the Last Year:   . Arboriculturist in the Last Year:   Transportation Needs:   . Film/video editor (Medical):   Marland Kitchen Lack of Transportation (Non-Medical):   Physical Activity:   . Days of  Exercise per Week:   . Minutes of Exercise per Session:   Stress:   . Feeling of Stress :   Social Connections:   . Frequency of Communication with Friends and Family:   . Frequency of Social Gatherings with Friends and Family:   . Attends Religious Services:   . Active Member of Clubs or Organizations:   . Attends Archivist Meetings:   Marland Kitchen Marital Status:   Intimate Partner Violence:   . Fear of Current or Ex-Partner:   . Emotionally Abused:   Marland Kitchen Physically Abused:   . Sexually Abused:     Past Surgical History:  Procedure Laterality Date  . ABDOMINAL HYSTERECTOMY    . FLEXIBLE BRONCHOSCOPY N/A 07/05/2016   Procedure: FLEXIBLE BRONCHOSCOPY;  Surgeon: Vilinda Boehringer, MD;  Location: [facility] ORS;  Service: Cardiopulmonary;  Laterality: N/A;    Family History    Problem Relation Age of Onset  . Parkinson's disease Mother   . Colon polyps Mother 18          . Breast cancer Sister     No Known Allergies  Current Outpatient Medications on File Prior to Visit  Medication Sig Dispense Refill  . azaTHIOprine (IMURAN) 50 MG tablet Take 75 mg every morning and 50 mg every evening. (Patient taking differently: Take 50 mg by mouth. Take 50 mg every morning and 50 mg every evening.) 75 tablet 0  . denosumab (PROLIA) 60 MG/ML SOLN injection Inject 60 mg into the skin every 6 (six) months. Administer in upper arm, thigh, or abdomen    . fluticasone (FLOVENT HFA) 110 MCG/ACT inhaler Inhale 2 puffs into the lungs 2 (two) times daily. 1 Inhaler 2  . montelukast (SINGULAIR) 10 MG tablet Take 1 tablet (10 mg total) by mouth daily. 30 tablet 2  . pantoprazole (PROTONIX) 20 MG tablet Take 1 tablet (20 mg total) by mouth 2 (two) times daily. 180 tablet 0  . predniSONE (DELTASONE) 1 MG tablet Take 2 mg by mouth daily with breakfast.    . propranolol (INDERAL) 10 MG tablet TAKE 1 TABLET TWICE A DAY FOR TREMOR (NEED APPOINTMENT FOR FURTHER REFILLS. PLEASE CALL OUR OFFICE TO MAKE AN APPOINTMENT) 180 tablet 0  . traZODone (DESYREL) 50 MG tablet TAKE 1 TABLET AT BEDTIME FOR SLEEP 30 tablet 0  . nitroGLYCERIN (NITROSTAT) 0.4 MG SL tablet Place 1 tablet (0.4 mg total) under the tongue every 5 (five) minutes as needed for chest pain. Maximum 3 doses. (Patient not taking: Reported on 01/18/2019) 25 tablet 3   No current facility-administered medications on file prior to visit.    BP 126/72   Pulse 80   Temp (!) 95.7 F (35.4 C) (Temporal)   Ht 5\' 2"  (1.575 m)   Wt 124 lb (56.2 kg)   SpO2 97%   BMI 22.68 kg/m    Objective:   Physical Exam  Constitutional: She appears well-nourished.  Cardiovascular: Normal rate and regular rhythm.  Respiratory: Effort normal and breath sounds normal.  Musculoskeletal:     Cervical back: Neck supple.  Skin: Skin is warm and dry.   Psychiatric: She has a normal mood and affect.           Assessment & Plan:

## 2020-01-22 ENCOUNTER — Ambulatory Visit
Admission: RE | Admit: 2020-01-22 | Discharge: 2020-01-22 | Disposition: A | Discharge status: Home / Self Care | Payer: Medicare Other | Source: Ambulatory Visit | Attending: Primary Care | Admitting: Primary Care

## 2020-01-22 DIAGNOSIS — M810 Age-related osteoporosis without current pathological fracture: Secondary | ICD-10-CM | POA: Insufficient documentation

## 2020-02-05 ENCOUNTER — Telehealth: Payer: Self-pay

## 2020-02-05 DIAGNOSIS — M810 Age-related osteoporosis without current pathological fracture: Secondary | ICD-10-CM

## 2020-02-05 NOTE — Telephone Encounter (Signed)
Pt due for Prolia 03-14-20. Pt has MCR and sup.  No PA required.  Deductible met.  Pt would owe approximately $0 for Prolia/admin fee. 

## 2020-02-06 ENCOUNTER — Other Ambulatory Visit: Payer: Self-pay

## 2020-02-06 ENCOUNTER — Encounter: Payer: Self-pay | Admitting: Pulmonary Disease

## 2020-02-06 ENCOUNTER — Ambulatory Visit: Payer: Medicare Other | Admitting: Pulmonary Disease

## 2020-02-06 VITALS — BP 100/56 | HR 88 | Temp 98.7°F | Ht 67.0 in | Wt 124.0 lb

## 2020-02-06 DIAGNOSIS — J302 Other seasonal allergic rhinitis: Secondary | ICD-10-CM

## 2020-02-06 DIAGNOSIS — J3089 Other allergic rhinitis: Secondary | ICD-10-CM

## 2020-02-06 DIAGNOSIS — J849 Interstitial pulmonary disease, unspecified: Secondary | ICD-10-CM

## 2020-02-06 DIAGNOSIS — J479 Bronchiectasis, uncomplicated: Secondary | ICD-10-CM

## 2020-02-06 NOTE — Progress Notes (Signed)
    Assessment & Plan:  1. Interstitial pulmonary disease (HCC) (Primary) - CT Chest High Resolution; Future - Allergen Panel (27) + IGE; Future - CBC with Differential/Platelet; Future  2. Bronchiectasis without complication (HCC)  3. Perennial allergic rhinitis with seasonal variation   Patient Instructions  We are going to obtain chest CT to evaluate the status of your fibrosis and bronchiectasis.  You did well on your walk today.  We may need to evaluate you with a cardiac catheterization but will await the CT and blood tests.  We are performing blood test to check for specific allergens that may be triggering you.  Follow-up in 6 to 8 weeks time.  Call sooner should any new problems arise.  Please note: late entry documentation due to logistical difficulties during COVID-19 pandemic. This note is filed for information purposes only, and is not intended to be used for billing, nor does it represent the full scope/nature of the visit in question. Please see any associated scanned media linked to date of encounter for additional pertinent information.  Subjective:    HPI: Crystal Gregory is a 86 y.o. female presenting to the pulmonology clinic on 02/06/2020 with report of: Follow-up (Patient has shortness of breath with exertion like walking,showering, getting dressed, etc. Patient has a productive cough with thick green sputum. Patient rheumatoid arthritis is a little worse and had a flare about 6 weeks ago. )     Outpatient Encounter Medications as of 02/06/2020  Medication Sig   denosumab  (PROLIA ) 60 MG/ML SOLN injection Inject 60 mg into the skin every 6 (six) months. Administer in upper arm, thigh, or abdomen (Patient not taking: Reported on 06/27/2024)   [DISCONTINUED] azaTHIOprine  (IMURAN ) 50 MG tablet Take 75 mg every morning and 50 mg every evening.   [DISCONTINUED] montelukast  (SINGULAIR ) 10 MG tablet Take 1 tablet (10 mg total) by mouth daily.   [DISCONTINUED]  nitroGLYCERIN  (NITROSTAT ) 0.4 MG SL tablet Place 1 tablet (0.4 mg total) under the tongue every 5 (five) minutes as needed for chest pain. Maximum 3 doses.   [DISCONTINUED] pantoprazole  (PROTONIX ) 20 MG tablet Take 1 tablet (20 mg total) by mouth 2 (two) times daily. For heartburn.   [DISCONTINUED] predniSONE  (DELTASONE ) 1 MG tablet Take 2 mg by mouth daily with breakfast.   [DISCONTINUED] propranolol  (INDERAL ) 10 MG tablet TAKE 1 TABLET TWICE A DAY FOR TREMOR   [DISCONTINUED] traZODone  (DESYREL ) 50 MG tablet TAKE 1 TABLET AT BEDTIME FOR SLEEP   [DISCONTINUED] fluticasone  (FLOVENT  HFA) 110 MCG/ACT inhaler Inhale 2 puffs into the lungs 2 (two) times daily.   No facility-administered encounter medications on file as of 02/06/2020.      Objective:   Vitals:   02/06/20 0939  BP: (!) 100/56  Pulse: 88  Temp: 98.7 F (37.1 C)  Height: 5' 7 (1.702 m)  Weight: 124 lb (56.2 kg)  SpO2: 98%  TempSrc: Temporal  BMI (Calculated): 19.42     Physical exam documentation is limited by delayed entry of information.

## 2020-02-06 NOTE — Patient Instructions (Signed)
We are going to obtain chest CT to evaluate the status of your fibrosis and bronchiectasis.  You did well on your walk today.  We may need to evaluate you with a cardiac catheterization but will await the CT and blood tests.  We are performing blood test to check for specific allergens that may be triggering you.  Follow-up in 6 to 8 weeks time.  Call sooner should any new problems arise.

## 2020-02-07 ENCOUNTER — Other Ambulatory Visit
Admission: RE | Admit: 2020-02-07 | Discharge: 2020-02-07 | Disposition: A | Discharge status: Home / Self Care | Payer: Medicare Other | Attending: Pulmonary Disease | Admitting: Pulmonary Disease

## 2020-02-07 ENCOUNTER — Other Ambulatory Visit: Payer: Self-pay

## 2020-02-07 DIAGNOSIS — J849 Interstitial pulmonary disease, unspecified: Secondary | ICD-10-CM | POA: Insufficient documentation

## 2020-02-07 LAB — CBC WITH DIFFERENTIAL/PLATELET
Abs Immature Granulocytes: 0.01 10*3/uL (ref 0.00–0.07)
Basophils Absolute: 0 10*3/uL (ref 0.0–0.1)
Basophils Relative: 1 %
Eosinophils Absolute: 0.1 10*3/uL (ref 0.0–0.5)
Eosinophils Relative: 2 %
HCT: 31.4 % — ABNORMAL LOW (ref 36.0–46.0)
Hemoglobin: 10.9 g/dL — ABNORMAL LOW (ref 12.0–15.0)
Immature Granulocytes: 0 %
Lymphocytes Relative: 17 %
Lymphs Abs: 0.8 10*3/uL (ref 0.7–4.0)
MCH: 37.6 pg — ABNORMAL HIGH (ref 26.0–34.0)
MCHC: 34.7 g/dL (ref 30.0–36.0)
MCV: 108.3 fL — ABNORMAL HIGH (ref 80.0–100.0)
Monocytes Absolute: 0.5 10*3/uL (ref 0.1–1.0)
Monocytes Relative: 10 %
Neutro Abs: 3.3 10*3/uL (ref 1.7–7.7)
Neutrophils Relative %: 70 %
Platelets: 268 10*3/uL (ref 150–400)
RBC: 2.9 MIL/uL — ABNORMAL LOW (ref 3.87–5.11)
RDW: 14.6 % (ref 11.5–15.5)
WBC: 4.7 10*3/uL (ref 4.0–10.5)
nRBC: 0 % (ref 0.0–0.2)

## 2020-02-07 NOTE — Progress Notes (Signed)
Spoke with Ms. Coogan and provided results from lab work.  Advised her to f/u with her pcp for further follow up.  She will schedule an appointment with her pcp.  She verbalized understanding.

## 2020-02-12 LAB — ALLERGEN PANEL (27) + IGE
Alternaria Alternata IgE: <0.1 kU/L
Aspergillus Fumigatus IgE: <0.1 kU/L
Bahia Grass IgE: <0.1 kU/L
Bermuda Grass IgE: <0.1 kU/L
Cat Dander IgE: <0.1 kU/L
Cedar, Mountain IgE: <0.1 kU/L
Cladosporium Herbarum IgE: <0.1 kU/L
Cocklebur IgE: <0.1 kU/L
Cockroach, American IgE: <0.1 kU/L
Common Silver Birch IgE: 0.19 kU/L — AB
D Farinae IgE: 0.43 kU/L — AB
D Pteronyssinus IgE: 0.43 kU/L — AB
Dog Dander IgE: <0.1 kU/L
Elm, American IgE: <0.1 kU/L
Hickory, White IgE: 0.59 kU/L — AB
IgE (Immunoglobulin E), Serum: 14 IU/mL (ref 6–495)
Johnson Grass IgE: <0.1 kU/L
Kentucky Bluegrass IgE: 0.42 kU/L — AB
Maple/Box Elder IgE: <0.1 kU/L
Mucor Racemosus IgE: <0.1 kU/L
Oak, White IgE: 0.58 kU/L — AB
Penicillium Chrysogen IgE: <0.1 kU/L
Pigweed, Rough IgE: <0.1 kU/L
Plantain, English IgE: <0.1 kU/L
Ragweed, Short IgE: <0.1 kU/L
Setomelanomma Rostrat: <0.1 kU/L
Timothy Grass IgE: 0.36 kU/L — AB
White Mulberry IgE: <0.1 kU/L

## 2020-02-12 NOTE — Telephone Encounter (Signed)
LVM. Please send call to this nurse

## 2020-02-15 ENCOUNTER — Ambulatory Visit
Admission: RE | Admit: 2020-02-15 | Discharge: 2020-02-15 | Disposition: A | Discharge status: Home / Self Care | Payer: Medicare Other | Source: Ambulatory Visit | Attending: Pulmonary Disease | Admitting: Pulmonary Disease

## 2020-02-15 ENCOUNTER — Other Ambulatory Visit: Payer: Self-pay

## 2020-02-15 DIAGNOSIS — I700 Atherosclerosis of aorta: Secondary | ICD-10-CM | POA: Diagnosis not present

## 2020-02-15 DIAGNOSIS — J849 Interstitial pulmonary disease, unspecified: Secondary | ICD-10-CM | POA: Insufficient documentation

## 2020-02-15 NOTE — Addendum Note (Signed)
Addended by: Randall An on: 02/15/2020 09:59 AM   Modules accepted: Orders

## 2020-02-15 NOTE — Telephone Encounter (Signed)
Contacted pt and scheduled for lab on 5/25 and inj on 6/15. Pt verbalized understanding.  Placed order for BPM

## 2020-02-20 ENCOUNTER — Other Ambulatory Visit: Payer: Self-pay

## 2020-02-20 DIAGNOSIS — K219 Gastro-esophageal reflux disease without esophagitis: Secondary | ICD-10-CM

## 2020-02-25 NOTE — Telephone Encounter (Signed)
Crystal Gregory, pt is checking on the status of her dg esophagus being scheduled. Can you please advise or call her to schedule, thanks!

## 2020-02-26 ENCOUNTER — Other Ambulatory Visit (INDEPENDENT_AMBULATORY_CARE_PROVIDER_SITE_OTHER): Payer: Medicare Other

## 2020-02-26 DIAGNOSIS — M810 Age-related osteoporosis without current pathological fracture: Secondary | ICD-10-CM

## 2020-02-26 LAB — BASIC METABOLIC PANEL
BUN: 14 mg/dL (ref 6–23)
CO2: 29 mEq/L (ref 19–32)
Calcium: 9.6 mg/dL (ref 8.4–10.5)
Chloride: 101 mEq/L (ref 96–112)
Creatinine, Ser: 0.76 mg/dL (ref 0.40–1.20)
GFR: 72.13 mL/min (ref >60.00)
Glucose, Bld: 93 mg/dL (ref 70–99)
Potassium: 4.2 mEq/L (ref 3.5–5.1)
Sodium: 138 mEq/L (ref 135–145)

## 2020-02-27 DIAGNOSIS — Z79899 Other long term (current) drug therapy: Secondary | ICD-10-CM | POA: Diagnosis not present

## 2020-02-27 DIAGNOSIS — M059 Rheumatoid arthritis with rheumatoid factor, unspecified: Secondary | ICD-10-CM | POA: Diagnosis not present

## 2020-02-27 DIAGNOSIS — J849 Interstitial pulmonary disease, unspecified: Secondary | ICD-10-CM | POA: Diagnosis not present

## 2020-02-27 MED ORDER — MONTELUKAST SODIUM 10 MG PO TABS: 10.0000 mg | ORAL_TABLET | Freq: Every day | ORAL | 2 refills | Status: DC

## 2020-02-27 NOTE — Telephone Encounter (Signed)
Ca level normal and CrCl is 47.26mL/min. Pt had covid vaccines in [place] to proceed with inj on 6/15

## 2020-03-05 ENCOUNTER — Other Ambulatory Visit: Payer: Self-pay

## 2020-03-05 ENCOUNTER — Ambulatory Visit
Admission: RE | Admit: 2020-03-05 | Discharge: 2020-03-05 | Disposition: A | Discharge status: Home / Self Care | Payer: Medicare Other | Source: Ambulatory Visit | Attending: Pulmonary Disease | Admitting: Pulmonary Disease

## 2020-03-05 DIAGNOSIS — K219 Gastro-esophageal reflux disease without esophagitis: Secondary | ICD-10-CM | POA: Diagnosis not present

## 2020-03-18 ENCOUNTER — Ambulatory Visit (INDEPENDENT_AMBULATORY_CARE_PROVIDER_SITE_OTHER): Payer: Medicare Other

## 2020-03-18 DIAGNOSIS — M810 Age-related osteoporosis without current pathological fracture: Secondary | ICD-10-CM

## 2020-03-18 MED ORDER — DENOSUMAB 60 MG/ML ~~LOC~~ SOSY
60.0000 mg | PREFILLED_SYRINGE | Freq: Once | SUBCUTANEOUS | Status: AC
  Administered 2020-03-18: 60 mg via SUBCUTANEOUS

## 2020-03-18 NOTE — Progress Notes (Signed)
Per orders of Allie Bossier, NP, injection of Prolia given by Randall An. Patient tolerated injection well.

## 2020-03-31 ENCOUNTER — Other Ambulatory Visit: Payer: Self-pay

## 2020-03-31 ENCOUNTER — Encounter: Payer: Self-pay | Admitting: Pulmonary Disease

## 2020-03-31 ENCOUNTER — Ambulatory Visit: Payer: Medicare Other | Admitting: Pulmonary Disease

## 2020-03-31 VITALS — BP 104/68 | HR 98 | Temp 97.8°F | Ht 67.0 in | Wt 121.0 lb

## 2020-03-31 DIAGNOSIS — J849 Interstitial pulmonary disease, unspecified: Secondary | ICD-10-CM

## 2020-03-31 DIAGNOSIS — M0510 Rheumatoid lung disease with rheumatoid arthritis of unspecified site: Secondary | ICD-10-CM

## 2020-03-31 DIAGNOSIS — R053 Chronic cough: Secondary | ICD-10-CM

## 2020-03-31 DIAGNOSIS — K219 Gastro-esophageal reflux disease without esophagitis: Secondary | ICD-10-CM

## 2020-03-31 DIAGNOSIS — R05 Cough: Secondary | ICD-10-CM

## 2020-03-31 DIAGNOSIS — J479 Bronchiectasis, uncomplicated: Secondary | ICD-10-CM

## 2020-03-31 MED ORDER — AZITHROMYCIN 250 MG PO TABS: ORAL_TABLET | ORAL | 3 refills | Status: DC

## 2020-03-31 MED ORDER — GABAPENTIN 100 MG PO CAPS: 100.0000 mg | ORAL_CAPSULE | Freq: Every day | ORAL | 3 refills | Status: DC

## 2020-03-31 NOTE — Patient Instructions (Addendum)
We are going to try azithromycin 1 tablet 3 times a week  We are also going to try gabapentin at bedtime   Follow-up in 4 to 6 weeks time

## 2020-03-31 NOTE — Progress Notes (Signed)
    Assessment & Plan:  1. Interstitial pulmonary disease (HCC) (Primary)  2. Bronchiectasis without complication (HCC)  3. Gastroesophageal reflux disease, unspecified whether esophagitis present  4. Rheumatoid lung (HCC)  5. Chronic cough   Patient Instructions  We are going to try azithromycin  1 tablet 3 times a week  We are also going to try gabapentin  at bedtime   Follow-up in 4 to 6 weeks time    Please note: late entry documentation due to logistical difficulties during COVID-19 pandemic. This note is filed for information purposes only, and is not intended to be used for billing, nor does it represent the full scope/nature of the visit in question. Please see any associated scanned media linked to date of encounter for additional pertinent information.  Subjective:    HPI: Crystal Gregory is a 86 y.o. female presenting to the pulmonology clinic on 03/31/2020 with report of: Follow-up (pt states breathing is baseline. c/o prod cough with gray to greenish mucus and sob after exertion. )     Outpatient Encounter Medications as of 03/31/2020  Medication Sig   denosumab  (PROLIA ) 60 MG/ML SOLN injection Inject 60 mg into the skin every 6 (six) months. Administer in upper arm, thigh, or abdomen (Patient not taking: Reported on 06/27/2024)   [DISCONTINUED] azaTHIOprine  (IMURAN ) 50 MG tablet Take 75 mg every morning and 50 mg every evening.   [DISCONTINUED] montelukast  (SINGULAIR ) 10 MG tablet Take 1 tablet (10 mg total) by mouth daily.   [DISCONTINUED] pantoprazole  (PROTONIX ) 20 MG tablet Take 1 tablet (20 mg total) by mouth 2 (two) times daily. For heartburn.   [DISCONTINUED] predniSONE  (DELTASONE ) 1 MG tablet Take 2 mg by mouth daily with breakfast.   [DISCONTINUED] propranolol  (INDERAL ) 10 MG tablet TAKE 1 TABLET TWICE A DAY FOR TREMOR   [DISCONTINUED] traZODone  (DESYREL ) 50 MG tablet TAKE 1 TABLET AT BEDTIME FOR SLEEP   [DISCONTINUED] azithromycin  (ZITHROMAX ) 250 MG  tablet 1 tablet Monday Wednesday Friday enthesis 3 times a week)   [DISCONTINUED] gabapentin  (NEURONTIN ) 100 MG capsule Take 1 capsule (100 mg total) by mouth at bedtime.   [DISCONTINUED] nitroGLYCERIN  (NITROSTAT ) 0.4 MG SL tablet Place 1 tablet (0.4 mg total) under the tongue every 5 (five) minutes as needed for chest pain. Maximum 3 doses.   No facility-administered encounter medications on file as of 03/31/2020.      Objective:   Vitals:   03/31/20 0926  BP: 104/68  Pulse: 98  Temp: 97.8 F (36.6 C)  Height: 5' 7 (1.702 m)  Weight: 121 lb (54.9 kg)  SpO2: (!) 87%  TempSrc: Temporal  BMI (Calculated): 18.95     Physical exam documentation is limited by delayed entry of information.

## 2020-05-07 ENCOUNTER — Ambulatory Visit: Payer: Medicare Other | Admitting: Pulmonary Disease

## 2020-05-07 ENCOUNTER — Other Ambulatory Visit: Payer: Self-pay

## 2020-05-07 ENCOUNTER — Encounter: Payer: Self-pay | Admitting: Pulmonary Disease

## 2020-05-07 VITALS — BP 120/60 | HR 74 | Temp 98.2°F | Ht 67.0 in | Wt 121.4 lb

## 2020-05-07 DIAGNOSIS — M0510 Rheumatoid lung disease with rheumatoid arthritis of unspecified site: Secondary | ICD-10-CM

## 2020-05-07 DIAGNOSIS — R05 Cough: Secondary | ICD-10-CM

## 2020-05-07 DIAGNOSIS — J849 Interstitial pulmonary disease, unspecified: Secondary | ICD-10-CM

## 2020-05-07 DIAGNOSIS — J479 Bronchiectasis, uncomplicated: Secondary | ICD-10-CM

## 2020-05-07 DIAGNOSIS — R053 Chronic cough: Secondary | ICD-10-CM

## 2020-05-07 NOTE — Progress Notes (Signed)
    Assessment & Plan:  1. ILD (interstitial lung disease) (HCC) (Primary)  2. Bronchiectasis without complication (HCC)  3. Chronic cough  4. Rheumatoid lung (HCC)   Patient Instructions  Continue azithromycin  (250 mg) 3 times a week Monday Wednesday Friday  Continue gabapentin  100 mg at bedtime  We will see you in 3 months time call sooner should any new difficulties arise  Please note: late entry documentation due to logistical difficulties during COVID-19 pandemic. This note is filed for information purposes only, and is not intended to be used for billing, nor does it represent the full scope/nature of the visit in question. Please see any associated scanned media linked to date of encounter for additional pertinent information.  Subjective:    HPI: Crystal Gregory is a 86 y.o. female presenting to the pulmonology clinic on 05/07/2020 with report of: Follow-up (pt reports of sob with exertion and prod cough with clear mucus)     Outpatient Encounter Medications as of 05/07/2020  Medication Sig   denosumab  (PROLIA ) 60 MG/ML SOLN injection Inject 60 mg into the skin every 6 (six) months. Administer in upper arm, thigh, or abdomen (Patient not taking: Reported on 06/27/2024)   [DISCONTINUED] azaTHIOprine  (IMURAN ) 50 MG tablet Take 75 mg every morning and 50 mg every evening.   [DISCONTINUED] azithromycin  (ZITHROMAX ) 250 MG tablet Take 1 tablet by mouth 3 (three) times a week.   [DISCONTINUED] gabapentin  (NEURONTIN ) 100 MG capsule Take 1 capsule (100 mg total) by mouth at bedtime.   [DISCONTINUED] montelukast  (SINGULAIR ) 10 MG tablet Take 1 tablet (10 mg total) by mouth daily.   [DISCONTINUED] pantoprazole  (PROTONIX ) 20 MG tablet Take 1 tablet (20 mg total) by mouth 2 (two) times daily. For heartburn.   [DISCONTINUED] predniSONE  (DELTASONE ) 1 MG tablet Take 2 mg by mouth daily with breakfast.   [DISCONTINUED] propranolol  (INDERAL ) 10 MG tablet TAKE 1 TABLET TWICE A DAY FOR TREMOR    [DISCONTINUED] traZODone  (DESYREL ) 50 MG tablet TAKE 1 TABLET AT BEDTIME FOR SLEEP   [DISCONTINUED] nitroGLYCERIN  (NITROSTAT ) 0.4 MG SL tablet Place 1 tablet (0.4 mg total) under the tongue every 5 (five) minutes as needed for chest pain. Maximum 3 doses.   No facility-administered encounter medications on file as of 05/07/2020.      Objective:   Vitals:   05/07/20 0952  BP: 120/60  Pulse: 74  Temp: 98.2 F (36.8 C)  Height: 5' 7 (1.702 m)  Weight: 121 lb 6.4 oz (55.1 kg)  SpO2: 99%  TempSrc: Temporal  BMI (Calculated): 19.01     Physical exam documentation is limited by delayed entry of information.

## 2020-05-07 NOTE — Patient Instructions (Signed)
Continue azithromycin (250 mg) 3 times a week Monday Wednesday Friday  Continue gabapentin 100 mg at bedtime  We will see you in 3 months time call sooner should any new difficulties arise

## 2020-05-19 DIAGNOSIS — Z23 Encounter for immunization: Secondary | ICD-10-CM | POA: Diagnosis not present

## 2020-05-29 DIAGNOSIS — M059 Rheumatoid arthritis with rheumatoid factor, unspecified: Secondary | ICD-10-CM | POA: Diagnosis not present

## 2020-05-29 DIAGNOSIS — Z79899 Other long term (current) drug therapy: Secondary | ICD-10-CM | POA: Diagnosis not present

## 2020-05-29 MED ORDER — MONTELUKAST SODIUM 10 MG PO TABS: 10.0000 mg | ORAL_TABLET | Freq: Every day | ORAL | 2 refills | Status: DC

## 2020-06-23 MED ORDER — GABAPENTIN 100 MG PO CAPS: 100.0000 mg | ORAL_CAPSULE | Freq: Every day | ORAL | 1 refills | Status: DC

## 2020-06-23 MED ORDER — AZITHROMYCIN 250 MG PO TABS: 250.0000 mg | ORAL_TABLET | ORAL | 1 refills | Status: DC

## 2020-06-23 MED ORDER — MONTELUKAST SODIUM 10 MG PO TABS: 10.0000 mg | ORAL_TABLET | Freq: Every day | ORAL | 1 refills | Status: DC

## 2020-06-30 DIAGNOSIS — Z111 Encounter for screening for respiratory tuberculosis: Secondary | ICD-10-CM | POA: Diagnosis not present

## 2020-06-30 DIAGNOSIS — J849 Interstitial pulmonary disease, unspecified: Secondary | ICD-10-CM | POA: Diagnosis not present

## 2020-06-30 DIAGNOSIS — Z79899 Other long term (current) drug therapy: Secondary | ICD-10-CM | POA: Diagnosis not present

## 2020-06-30 DIAGNOSIS — M059 Rheumatoid arthritis with rheumatoid factor, unspecified: Secondary | ICD-10-CM | POA: Diagnosis not present

## 2020-06-30 DIAGNOSIS — D649 Anemia, unspecified: Secondary | ICD-10-CM | POA: Diagnosis not present

## 2020-07-22 DIAGNOSIS — Z23 Encounter for immunization: Secondary | ICD-10-CM | POA: Diagnosis not present

## 2020-08-06 ENCOUNTER — Other Ambulatory Visit
Admission: RE | Admit: 2020-08-06 | Discharge: 2020-08-06 | Disposition: A | Discharge status: Home / Self Care | Payer: Medicare Other | Source: Ambulatory Visit | Attending: Pulmonary Disease | Admitting: Pulmonary Disease

## 2020-08-06 ENCOUNTER — Ambulatory Visit (INDEPENDENT_AMBULATORY_CARE_PROVIDER_SITE_OTHER): Payer: Medicare Other | Admitting: Pulmonary Disease

## 2020-08-06 ENCOUNTER — Other Ambulatory Visit: Payer: Self-pay

## 2020-08-06 ENCOUNTER — Encounter: Payer: Self-pay | Admitting: Pulmonary Disease

## 2020-08-06 VITALS — BP 126/74 | HR 74 | Temp 97.1°F | Ht 68.0 in | Wt 121.2 lb

## 2020-08-06 DIAGNOSIS — R053 Chronic cough: Secondary | ICD-10-CM

## 2020-08-06 DIAGNOSIS — J849 Interstitial pulmonary disease, unspecified: Secondary | ICD-10-CM

## 2020-08-06 DIAGNOSIS — D649 Anemia, unspecified: Secondary | ICD-10-CM

## 2020-08-06 DIAGNOSIS — E538 Deficiency of other specified B group vitamins: Secondary | ICD-10-CM | POA: Insufficient documentation

## 2020-08-06 DIAGNOSIS — R5383 Other fatigue: Secondary | ICD-10-CM

## 2020-08-06 DIAGNOSIS — R0602 Shortness of breath: Secondary | ICD-10-CM

## 2020-08-06 LAB — CBC WITH DIFFERENTIAL/PLATELET
Abs Immature Granulocytes: 0.02 10*3/uL (ref 0.00–0.07)
Basophils Absolute: 0 10*3/uL (ref 0.0–0.1)
Basophils Relative: 1 %
Eosinophils Absolute: 0 10*3/uL (ref 0.0–0.5)
Eosinophils Relative: 0 %
HCT: 28.8 % — ABNORMAL LOW (ref 36.0–46.0)
Hemoglobin: 9.5 g/dL — ABNORMAL LOW (ref 12.0–15.0)
Immature Granulocytes: 0 %
Lymphocytes Relative: 17 %
Lymphs Abs: 0.8 10*3/uL (ref 0.7–4.0)
MCH: 37.8 pg — ABNORMAL HIGH (ref 26.0–34.0)
MCHC: 33 g/dL (ref 30.0–36.0)
MCV: 114.7 fL — ABNORMAL HIGH (ref 80.0–100.0)
Monocytes Absolute: 0.4 10*3/uL (ref 0.1–1.0)
Monocytes Relative: 8 %
Neutro Abs: 3.4 10*3/uL (ref 1.7–7.7)
Neutrophils Relative %: 74 %
Platelets: 276 10*3/uL (ref 150–400)
RBC: 2.51 MIL/uL — ABNORMAL LOW (ref 3.87–5.11)
RDW: 15.5 % (ref 11.5–15.5)
Smear Review: NORMAL
WBC: 4.7 10*3/uL (ref 4.0–10.5)
nRBC: 0 % (ref 0.0–0.2)

## 2020-08-06 LAB — IRON AND TIBC
Iron: 76 ug/dL (ref 28–170)
Saturation Ratios: 25 % (ref 10.4–31.8)
TIBC: 304 ug/dL (ref 250–450)
UIBC: 228 ug/dL

## 2020-08-06 LAB — VITAMIN B12: Vitamin B-12: 277 pg/mL (ref 180–914)

## 2020-08-06 LAB — FERRITIN: Ferritin: 79 ng/mL (ref 11–307)

## 2020-08-06 MED ORDER — BENZONATATE 200 MG PO CAPS: 200.0000 mg | ORAL_CAPSULE | Freq: Two times a day (BID) | ORAL | 2 refills | Status: DC

## 2020-08-06 NOTE — Progress Notes (Signed)
Subjective:    Patient ID: Crystal Gregory, female    DOB: [birth year], 86 y.o.   MRN: [id number]  PROBLEMS: History of prior ARDS 1999 Prior long term nitrofurantoin therapy Pulmonary fibrosis Bronchiectasis Mild COPD  PT PROFILE: 86 y.o. with remote minimal smoking history (15 PY, quit 1982) initially evaluated by Dr Stevenson Clinch 03/2016 and subsequently by Dr Chase Caller for pulmonary fibrosis. Pt has prior history of severe ARDS (1999) and has been on chronic nitrofurantoin for recurrent UTIs. She has been tried on 2 maintenance inhalers without discernible benefit.  DATA: CT chest 03/22/16: Generalized pulmonary hyperinflation but without bullous emphysema. Widespread bronchiectasis with bronchial wall thickening consistent with inflammatory bronchitis. Areas of pulmonary fibrosis, most pronounced in the lower lobes, where the bronchiectasis is more extensive, including areas of pulmonary lung destruction HRCT chest 06/11/16: Diffuse cylindrical and varicoid bronchiectasis throughout both lungs, most severe at the lung bases. Basilar predominant fibrotic interstitial lung disease with patchy honeycombing, with mild progression in the short interval since 03/22/2016 Spirometry 06/18/16: Mild obstruction, FEV1 1.42 liters (63%), FVC 2.43 (80%) Bronchoscopy 07/05/16: Normal airway exam. BAL negative for AFB Echocardiogram 10/07/15: LVEF 60%, Grade I DD, mild MR, RVSP estimate 35 mmHg HRCT 11/04/16: again compatible with interstitial lung disease, and considered diagnostic of usual interstitial pneumonia (UIP) from an imaging standpoint. There has been no significant progression of disease compared to the recent prior examination PFT 02/10/17: no obstruction, normal TLC, moderate reduction in DLCO 6MWT 03/11/17: 345 m. No desaturation PFTs 11/22/17: No obstruction.  Lung volumes normal.  DLCO moderately reduced (55% predicted). [place] since 02/10/17 PFTs 11/23/18 : FVC: 2.67 L (95 %pred), FEV1: 2.35 L  (112 %pred), FEV1/FVC: 88%, TLC: 4.98 L (88 %pred), DLCO 42 %pred 2D echo 11/16/2019:Left ventricular ejection fraction, by estimation, is 60 to 65%. Left ventricular diastolic parameters were normal.Right ventricular systolic function is normal. The right ventricular size is normal. Mildly elevated pulmonary artery systolic pressure. The estimated right ventricular systolic pressure is 00.7 mmHg. Left atrial size was mildly dilated.Tricuspid valve regurgitation is mild to moderate CT chest 02/15/2020:The appearance of the lungs is compatible with interstitial lung disease, with a spectrum of findings considered diagnostic of usual interstitial pneumonia (UIP) per current ATS guidelines. Minimal progression compared to the prior examination.    INTERVAL: Last encounter was for August 2021, at that time she was doing well with cough fairly well controlled.  Instructed to continue Azithromycin 3 times a week and gabapentin 100 mg at bedtime.  Cough and dyspnea controlled at that time.  HPI Crystal Gregory follows today for the issues above.  She is very complex patient with interstitial lung disease likely secondary to rheumatoid lung, post ARDS fibrosis and past use of long-term Macrodantin.  She has also been plagued with a dry chronic cough due to this issue as well as gastroesophageal reflux with laryngopharyngeal component.  During her last visit she was doing relatively well today she presents however stating that she is more short of breath than her baseline.  Normally she can walk 2 miles on level ground around her neighborhood but she has noted that she starts to get now winded after 1 mile.  This has occurred over the last several months.  He has a very complex active problem list as below.  She has not had any sputum production or hemoptysis.  She has not had any fevers, chills or sweats.  Weight and appetite are stable.  She does note that around 4:00 in the evening she starts  increasingly having more  issues with cough and generalized fatigue.  She is chronically on prednisone at 2 mg daily in the mornings for her RA.  She has had chronic issues with anemia and appears somewhat pale today.  He has been on Imuran of longstanding due to rheumatoid arthritis.  When she develops issues with dyspnea particularly during exertion she only improves with rest.   Review of Systems A 10 point review of systems was performed and it is as noted above otherwise negative.  Patient Active Problem List   Diagnosis Date Noted  . Anemia 08/06/2020  . Lower extremity edema 03/06/2018  . TMJ (temporomandibular joint syndrome) 03/02/2018  . RA (rheumatoid arthritis) ([place]) 07/13/2017  . Polyarthralgia 04/25/2017  . ILD (interstitial lung disease) ([place]) 06/18/2016  . IPF (idiopathic pulmonary fibrosis) ([place]) 03/23/2016  . Bronchiectasis without complication ([place]) 32/99/2426  . Allergic rhinitis 03/22/2016  . Anal fissure 08/18/2015  . Insomnia 08/18/2015  . Osteoporosis 02/13/2015  . Stress due to illness of family member 09/16/2014  . Hiatal hernia 10/13/2012  . GERD (gastroesophageal reflux disease)   . Allergy    No Known Allergies  Current Meds  Medication Sig  . azaTHIOprine (IMURAN) 50 MG tablet Take 75 mg every morning and 50 mg every evening. (Patient taking differently: Take 50 mg by mouth. Take 50 mg every morning and 50 mg every evening.)  . azithromycin (ZITHROMAX) 250 MG tablet Take 1 tablet (250 mg total) by mouth 3 (three) times a week.  . denosumab (PROLIA) 60 MG/ML SOLN injection Inject 60 mg into the skin every 6 (six) months. Administer in upper arm, thigh, or abdomen  . gabapentin (NEURONTIN) 100 MG capsule Take 1 capsule (100 mg total) by mouth at bedtime.  . montelukast (SINGULAIR) 10 MG tablet Take 1 tablet (10 mg total) by mouth daily.  . pantoprazole (PROTONIX) 20 MG tablet Take 1 tablet (20 mg total) by mouth 2 (two) times daily. For heartburn.  . predniSONE (DELTASONE) 1 MG  tablet Take 2 mg by mouth daily with breakfast.  . propranolol (INDERAL) 10 MG tablet TAKE 1 TABLET TWICE A DAY FOR TREMOR  . traZODone (DESYREL) 50 MG tablet TAKE 1 TABLET AT BEDTIME FOR SLEEP   Immunization History  Administered Date(s) Administered  . Fluad Quad(high Dose 65+) 07/24/2019  . Influenza Split 06/04/2012  . Influenza, High Dose Seasonal PF 09/10/2016  . Influenza,inj,Quad PF,6+ Mos 07/04/2013, 08/18/2015, 07/13/2017, 06/19/2018  . Influenza-Unspecified 07/15/2020  . Moderna SARS-COVID-2 Vaccination 10/22/2019, 11/19/2019  . Pneumococcal Conjugate-13 10/06/2011  . Pneumococcal Polysaccharide-23 06/19/2018  . Zoster 01/14/2007        Objective:   Physical Exam BP 126/74 (BP Location: Left Arm, Cuff Size: Normal)   Pulse 74   Temp (!) 97.1 F (36.2 C) (Temporal)   Ht 5\' 8"  (1.727 m)   Wt 121 lb 3.2 oz (55 kg)   SpO2 98%   BMI 18.43 kg/m  GENERAL: Thin well-developed woman in no acute distress.  Fully ambulatory.  She is pale HEAD: Normocephalic, atraumatic.  EYES: Pupils equal, round, reactive to light.  No scleral icterus.  MOUTH: Nose/mouth/throat not examined due to masking requirements for COVID 19. NECK: Supple. No thyromegaly. Trachea midline. No JVD.  No adenopathy. PULMONARY: Good air entry bilaterally.  Coarse breath sounds at the bases, faint dry crackles. CARDIOVASCULAR: S1 and S2. Regular rate and rhythm.  No rubs, murmurs or gallops heard.  ABDOMEN: Nondistended,  MUSCULOSKELETAL: No joint deformity, no clubbing, no edema.  NEUROLOGIC: No overt focal deficit, no gait disturbance, speech is fluent. SKIN: Intact,warm,dry. PSYCH: Mood and behavior are normal.  Ambulatory oximetry performed today: No desaturations, O2 sats remained at 99%.     Assessment & Plan:     ICD-10-CM   1. ILD (interstitial lung disease) (HCC)  J84.9 Pulse oximetry, overnight   Multifactorial, underlying RA, Macrodantin use, past ARDS Repeat PFTs Overnight oximetry  2.  Anemia, unspecified type  D64.9 CBC w/Diff    Ferritin    Iron Binding Cap (TIBC)([facility])    CANCELED: Iron   Macrocytosis may be secondary to Imuran use Recheck CBC Check B12 and folate Check iron studies  3. SOB (shortness of breath)  R06.02 Pulmonary Function Test [facility] Only   Query worsening of anemia Will obtain PFTs to reassess ILD If PFTs worsen will recheck CT Last chest CT checked May 2021  4. Other fatigue  R53.83    Exhibits mostly in the evenings Query relative adrenal insufficiency due to chronic prednisone use   5. Vitamin B 12 deficiency - under investigation  E53.8 B12   Patient with anemia and macrocytosis Rule out B12/folate deficiency Macrocytosis may be due to Imuran use May also be contributing to fatigue  6. Chronic cough  R05.3    Tessalon as needed May increase gabapentin to 200 mg at bedtime   Orders Placed This Encounter  Procedures  . CBC w/Diff    Standing Status:   Future    Number of Occurrences:   1    Standing Expiration Date:   08/06/2021  . B12    Standing Status:   Future    Number of Occurrences:   1    Standing Expiration Date:   08/06/2021  . Ferritin    Standing Status:   Future    Number of Occurrences:   1    Standing Expiration Date:   08/06/2021  . Iron Binding Cap (TIBC)([facility])    Standing Status:   Future    Number of Occurrences:   1    Standing Expiration Date:   08/06/2021  . Pulse oximetry, overnight    On roomair Dme:new start    Standing Status:   Future    Standing Expiration Date:   08/06/2021  . Pulmonary Function Test [facility] Only    Standing Status:   Future    Number of Occurrences:   1    Standing Expiration Date:   08/06/2021    Scheduling Instructions:     Next available.    Order Specific Question:   Full PFT: includes the following: basic spirometry, spirometry pre & post bronchodilator, diffusion capacity (DLCO), lung volumes    Answer:   Full PFT   Meds ordered this encounter    Medications  . benzonatate (TESSALON) 200 MG capsule    Sig: Take 1 capsule (200 mg total) by mouth in the morning and at bedtime.    Dispense:  30 capsule    Refill:  2   Discussion:  Patient has had very complex pulmonary history with interstitial lung disease likely related to rheumatoid arthritis, prior use of Macrodantin use and post inflammatory fibrosis post ARDS.  She has been relatively stable however has noted increasing dyspnea.  Will reassess anemia, she does have chronic anemia however appears to be somewhat paler today than her baseline.  She has been on Imuran of longstanding for rheumatoid arthritis.  If this may be cost.  Will obtain laboratory data as above.  She may  benefit from hematology input.  She has been noticing fatigue particularly in the evenings query if this may be developing adrenal insufficiency.  She has also been on steroids albeit low-dose, of longstanding.  We will initiate work-up as above.  She also has noted somewhat increasing cough.  Will ask her to increase gabapentin to 200 mg at bedtime to see if this helps and will start trial of Tessalon Perles as needed.  We will see her in follow-up in contact us sooner should any new problems arise.   Renold Don, MD [facility]   *This note was dictated using voice recognition software/Dragon.  Despite best efforts to proofread, errors can occur which can change the meaning.  Any change was purely unintentional.

## 2020-08-06 NOTE — Patient Instructions (Signed)
We are ordering tests to check for your anemia  Ordering test to check for overnight oxygen level  Repeating breathing tests to compare to previous  See you back in 4 to 6 weeks time call sooner should any new problems arise

## 2020-08-11 ENCOUNTER — Encounter: Payer: Self-pay | Admitting: Oncology

## 2020-08-11 ENCOUNTER — Inpatient Hospital Stay: Payer: Medicare Other | Attending: Oncology | Admitting: Oncology

## 2020-08-11 ENCOUNTER — Inpatient Hospital Stay: Payer: Medicare Other

## 2020-08-11 ENCOUNTER — Other Ambulatory Visit: Payer: Self-pay

## 2020-08-11 VITALS — BP 129/75 | HR 76 | Temp 98.9°F | Resp 16 | Wt 122.4 lb

## 2020-08-11 DIAGNOSIS — G4700 Insomnia, unspecified: Secondary | ICD-10-CM | POA: Insufficient documentation

## 2020-08-11 DIAGNOSIS — R5383 Other fatigue: Secondary | ICD-10-CM | POA: Diagnosis not present

## 2020-08-11 DIAGNOSIS — Z79899 Other long term (current) drug therapy: Secondary | ICD-10-CM | POA: Insufficient documentation

## 2020-08-11 DIAGNOSIS — Z87891 Personal history of nicotine dependence: Secondary | ICD-10-CM | POA: Insufficient documentation

## 2020-08-11 DIAGNOSIS — E538 Deficiency of other specified B group vitamins: Secondary | ICD-10-CM | POA: Insufficient documentation

## 2020-08-11 DIAGNOSIS — K219 Gastro-esophageal reflux disease without esophagitis: Secondary | ICD-10-CM

## 2020-08-11 DIAGNOSIS — J84112 Idiopathic pulmonary fibrosis: Secondary | ICD-10-CM | POA: Diagnosis not present

## 2020-08-11 DIAGNOSIS — Z803 Family history of malignant neoplasm of breast: Secondary | ICD-10-CM | POA: Insufficient documentation

## 2020-08-11 DIAGNOSIS — I2510 Atherosclerotic heart disease of native coronary artery without angina pectoris: Secondary | ICD-10-CM

## 2020-08-11 DIAGNOSIS — R0602 Shortness of breath: Secondary | ICD-10-CM

## 2020-08-11 DIAGNOSIS — R5382 Chronic fatigue, unspecified: Secondary | ICD-10-CM | POA: Insufficient documentation

## 2020-08-11 DIAGNOSIS — J8410 Pulmonary fibrosis, unspecified: Secondary | ICD-10-CM | POA: Insufficient documentation

## 2020-08-11 DIAGNOSIS — M069 Rheumatoid arthritis, unspecified: Secondary | ICD-10-CM | POA: Insufficient documentation

## 2020-08-11 DIAGNOSIS — M0510 Rheumatoid lung disease with rheumatoid arthritis of unspecified site: Secondary | ICD-10-CM | POA: Insufficient documentation

## 2020-08-11 DIAGNOSIS — Z8371 Family history of colonic polyps: Secondary | ICD-10-CM | POA: Diagnosis not present

## 2020-08-11 DIAGNOSIS — D539 Nutritional anemia, unspecified: Secondary | ICD-10-CM | POA: Diagnosis not present

## 2020-08-11 DIAGNOSIS — D7589 Other specified diseases of blood and blood-forming organs: Secondary | ICD-10-CM | POA: Diagnosis not present

## 2020-08-11 DIAGNOSIS — Z818 Family history of other mental and behavioral disorders: Secondary | ICD-10-CM

## 2020-08-11 LAB — COMPREHENSIVE METABOLIC PANEL
ALT: 10 U/L (ref 0–44)
AST: 21 U/L (ref 15–41)
Albumin: 4.2 g/dL (ref 3.5–5.0)
Alkaline Phosphatase: 44 U/L (ref 38–126)
Anion gap: 9 (ref 5–15)
BUN: 17 mg/dL (ref 8–23)
CO2: 28 mmol/L (ref 22–32)
Calcium: 9.6 mg/dL (ref 8.9–10.3)
Chloride: 98 mmol/L (ref 98–111)
Creatinine, Ser: 0.72 mg/dL (ref 0.44–1.00)
GFR, Estimated: >60 mL/min (ref >60)
Glucose, Bld: 107 mg/dL — ABNORMAL HIGH (ref 70–99)
Potassium: 4.7 mmol/L (ref 3.5–5.1)
Sodium: 135 mmol/L (ref 135–145)
Total Bilirubin: 0.9 mg/dL (ref 0.3–1.2)
Total Protein: 8.2 g/dL — ABNORMAL HIGH (ref 6.5–8.1)

## 2020-08-11 LAB — CBC WITH DIFFERENTIAL/PLATELET
Abs Immature Granulocytes: 0.03 10*3/uL (ref 0.00–0.07)
Basophils Absolute: 0 10*3/uL (ref 0.0–0.1)
Basophils Relative: 1 %
Eosinophils Absolute: 0.1 10*3/uL (ref 0.0–0.5)
Eosinophils Relative: 1 %
HCT: 29.6 % — ABNORMAL LOW (ref 36.0–46.0)
Hemoglobin: 9.9 g/dL — ABNORMAL LOW (ref 12.0–15.0)
Immature Granulocytes: 1 %
Lymphocytes Relative: 21 %
Lymphs Abs: 1 10*3/uL (ref 0.7–4.0)
MCH: 38.2 pg — ABNORMAL HIGH (ref 26.0–34.0)
MCHC: 33.4 g/dL (ref 30.0–36.0)
MCV: 114.3 fL — ABNORMAL HIGH (ref 80.0–100.0)
Monocytes Absolute: 0.5 10*3/uL (ref 0.1–1.0)
Monocytes Relative: 9 %
Neutro Abs: 3.2 10*3/uL (ref 1.7–7.7)
Neutrophils Relative %: 67 %
Platelets: 300 10*3/uL (ref 150–400)
RBC: 2.59 MIL/uL — ABNORMAL LOW (ref 3.87–5.11)
RDW: 15.4 % (ref 11.5–15.5)
WBC: 4.8 10*3/uL (ref 4.0–10.5)
nRBC: 0 % (ref 0.0–0.2)

## 2020-08-11 LAB — RETICULOCYTES
Immature Retic Fract: 9 % (ref 2.3–15.9)
RBC.: 2.58 MIL/uL — ABNORMAL LOW (ref 3.87–5.11)
Retic Count, Absolute: 57.5 10*3/uL (ref 19.0–186.0)
Retic Ct Pct: 2.2 % (ref 0.4–3.1)

## 2020-08-11 LAB — FOLATE: Folate: 14.3 ng/mL (ref >5.9)

## 2020-08-11 LAB — TSH: TSH: 2.034 u[IU]/mL (ref 0.350–4.500)

## 2020-08-11 MED ORDER — CYANOCOBALAMIN 1000 MCG/ML IJ SOLN
1000.0000 ug | Freq: Once | INTRAMUSCULAR | Status: AC
  Administered 2020-08-11: 1000 ug via INTRAMUSCULAR
  Filled 2020-08-11: qty 1

## 2020-08-11 NOTE — Progress Notes (Signed)
Hematology/Oncology Consult note [facility] Telephone:([phone] [phone]  Patient Care Team: Pleas Koch, NP as PCP - General (Internal Medicine) Minna Merritts, MD as PCP - Cardiology (Cardiology) Minna Merritts, MD as Consulting Physician (Cardiology) Marlowe Sax, MD as Consulting Physician (Rheumatology)   Name of the patient: Crystal Gregory  [id number]  1931-10-18    Reason for referral-anemia   Referring physician-Dr. Patsey Berthold  Date of visit: 08/11/20   History of presenting illness- Patient is a 86 year old female referred for macrocytic anemia.  She has a prior history of rheumatoid arthritis as well as interstitial lung disease from rheumatoid arthritis as well.  She is not on home oxygen.  She has been referred for anemia.  Dating back at her CBC is her hemoglobin was always between 10-11 even back in 2018 and 2019.  Her most recent CBC from 08/06/2020 showed white count of 4.7, H&H of 9.5/28.8 with an MCV of 114 and platelet count of 276.  She has developed progressive macrocytosis since 2019.  B12 levels were mildly low at 277.  Patient reports chronic fatigue and exertional shortness of breath.  She reports having decent amount of energy after 4 PM and following that her energy levels dropped significantly.  Recent iron studies were normal.  She is on Imuran for rheumatoid arthritis  ECOG PS- 1  Pain scale- 4   Review of systems- Review of Systems  Constitutional: Positive for malaise/fatigue. Negative for chills, fever and weight loss.  HENT: Negative for congestion, ear discharge and nosebleeds.   Eyes: Negative for blurred vision.  Respiratory: Positive for shortness of breath. Negative for cough, hemoptysis, sputum production and wheezing.   Cardiovascular: Negative for chest pain, palpitations, orthopnea and claudication.  Gastrointestinal: Negative for abdominal pain, blood in stool, constipation,  diarrhea, heartburn, melena, nausea and vomiting.  Genitourinary: Negative for dysuria, flank pain, frequency, hematuria and urgency.  Musculoskeletal: Positive for joint pain. Negative for back pain and myalgias.  Skin: Negative for rash.  Neurological: Negative for dizziness, tingling, focal weakness, seizures, weakness and headaches.  Endo/Heme/Allergies: Does not bruise/bleed easily.  Psychiatric/Behavioral: Negative for depression and suicidal ideas. The patient does not have insomnia.     No Known Allergies  Patient Active Problem List   Diagnosis Date Noted  . Anemia 08/06/2020  . Lower extremity edema 03/06/2018  . TMJ (temporomandibular joint syndrome) 03/02/2018  . RA (rheumatoid arthritis) ([place]) 07/13/2017  . Polyarthralgia 04/25/2017  . ILD (interstitial lung disease) ([place]) 06/18/2016  . IPF (idiopathic pulmonary fibrosis) ([place]) 03/23/2016  . Bronchiectasis without complication ([place]) 16/10/930  . Allergic rhinitis 03/22/2016  . Anal fissure 08/18/2015  . Insomnia 08/18/2015  . Osteoporosis 02/13/2015  . Stress due to illness of family member 09/16/2014  . Hiatal hernia 10/13/2012  . GERD (gastroesophageal reflux disease)   . Allergy      Past Medical History:  Diagnosis Date  . Allergy   . CAP (community acquired pneumonia) 08/23/2017  . Chest pain    a. 09/04/2018 MV: EF >65%. No ischemia/infarct.  . Coronary artery calcification seen on CT scan   . Diastolic dysfunction    a. 10/2016 Echo: EF 60-65%, no rwma, Gr1 DD, mild MR, nl RV fxn, mild to mod TR, PASP 12mmHg.  Marland Kitchen Dyspnea   . GERD (gastroesophageal reflux disease)   . Interstitial lung disorders ([place])   . Pulmonary fibrosis ([place])   . Septicemia ([place]) 1999   spent 4 weeks in ICU, intubated -? PNA  Past Surgical History:  Procedure Laterality Date  . ABDOMINAL HYSTERECTOMY    . FLEXIBLE BRONCHOSCOPY N/A 07/05/2016   Procedure: FLEXIBLE BRONCHOSCOPY;  Surgeon: Vilinda Boehringer, MD;  Location: [facility]  ORS;  Service: Cardiopulmonary;  Laterality: N/A;    Social History   Socioeconomic History  . Marital status: Married    Spouse name: Not on file  . Number of children: 2  . Years of education: Not on file  . Highest education level: Not on file  Occupational History  . Occupation: Retired   Tobacco Use  . Smoking status: Former Smoker    Packs/day: 0.50    Years: 30.00    Pack years: 15.00    Quit date: 01/16/1981    Years since quitting: 39.5  . Smokeless tobacco: Never Used  Vaping Use  . Vaping Use: Never used  Substance and Sexual Activity  . Alcohol use: Yes    Comment: 2 drinks daily   . Drug use: No  . Sexual activity: Not on file  Other Topics Concern  . Not on file  Social History Narrative   Married.     2 children.  Lives with her husband in [place].   Social Determinants of Health   Financial Resource Strain:   . Difficulty of Paying Living Expenses: Not on file  Food Insecurity:   . Worried About Charity fundraiser in the Last Year: Not on file  . Ran Out of Food in the Last Year: Not on file  Transportation Needs:   . Lack of Transportation (Medical): Not on file  . Lack of Transportation (Non-Medical): Not on file  Physical Activity:   . Days of Exercise per Week: Not on file  . Minutes of Exercise per Session: Not on file  Stress:   . Feeling of Stress : Not on file  Social Connections:   . Frequency of Communication with Friends and Family: Not on file  . Frequency of Social Gatherings with Friends and Family: Not on file  . Attends Religious Services: Not on file  . Active Member of Clubs or Organizations: Not on file  . Attends Archivist Meetings: Not on file  . Marital Status: Not on file  Intimate Partner Violence:   . Fear of Current or Ex-Partner: Not on file  . Emotionally Abused: Not on file  . Physically Abused: Not on file  . Sexually Abused: Not on file     Family History  Problem Relation Age of Onset  .  Parkinson's disease Mother   . Colon polyps Mother 25          . Breast cancer Sister      Current Outpatient Medications:  .  azaTHIOprine (IMURAN) 50 MG tablet, Take 75 mg every morning and 50 mg every evening., Disp: 75 tablet, Rfl: 0 .  azithromycin (ZITHROMAX) 250 MG tablet, Take 1 tablet (250 mg total) by mouth 3 (three) times a week., Disp: 36 each, Rfl: 1 .  benzonatate (TESSALON) 200 MG capsule, Take 1 capsule (200 mg total) by mouth in the morning and at bedtime., Disp: 30 capsule, Rfl: 2 .  denosumab (PROLIA) 60 MG/ML SOLN injection, Inject 60 mg into the skin every 6 (six) months. Administer in upper arm, thigh, or abdomen, Disp: , Rfl:  .  gabapentin (NEURONTIN) 100 MG capsule, Take 1 capsule (100 mg total) by mouth at bedtime., Disp: 90 capsule, Rfl: 1 .  montelukast (SINGULAIR) 10 MG tablet, Take 1 tablet (10  mg total) by mouth daily., Disp: 90 tablet, Rfl: 1 .  pantoprazole (PROTONIX) 20 MG tablet, Take 1 tablet (20 mg total) by mouth 2 (two) times daily. For heartburn., Disp: 180 tablet, Rfl: 3 .  predniSONE (DELTASONE) 1 MG tablet, Take 2 mg by mouth daily with breakfast., Disp: , Rfl:  .  propranolol (INDERAL) 10 MG tablet, TAKE 1 TABLET TWICE A DAY FOR TREMOR, Disp: 180 tablet, Rfl: 3 .  traZODone (DESYREL) 50 MG tablet, TAKE 1 TABLET AT BEDTIME FOR SLEEP, Disp: 90 tablet, Rfl: 3 .  nitroGLYCERIN (NITROSTAT) 0.4 MG SL tablet, Place 1 tablet (0.4 mg total) under the tongue every 5 (five) minutes as needed for chest pain. Maximum 3 doses., Disp: 25 tablet, Rfl: 3   Physical exam:  Vitals:   08/11/20 1106  BP: 129/75  Pulse: 76  Resp: 16  Temp: 98.9 F (37.2 C)  TempSrc: Tympanic  SpO2: 100%  Weight: 122 lb 6.4 oz (55.5 kg)   Physical Exam Constitutional:      Comments: Thin elderly female in no acute distress  Cardiovascular:     Rate and Rhythm: Normal rate and regular rhythm.     Heart sounds: Normal heart sounds.  Pulmonary:     Effort: Pulmonary effort is  normal.     Breath sounds: Normal breath sounds.  Abdominal:     General: Bowel sounds are normal.     Palpations: Abdomen is soft.  Skin:    General: Skin is warm and dry.  Neurological:     Mental Status: She is alert and oriented to person, place, and time.        CMP Latest Ref Rng & Units 08/11/2020  Glucose 70 - 99 mg/dL 107(H)  BUN 8 - 23 mg/dL 17  Creatinine 0.44 - 1.00 mg/dL 0.72  Sodium 135 - 145 mmol/L 135  Potassium 3.5 - 5.1 mmol/L 4.7  Chloride 98 - 111 mmol/L 98  CO2 22 - 32 mmol/L 28  Calcium 8.9 - 10.3 mg/dL 9.6  Total Protein 6.5 - 8.1 g/dL 8.2(H)  Total Bilirubin 0.3 - 1.2 mg/dL 0.9  Alkaline Phos 38 - 126 U/L 44  AST 15 - 41 U/L 21  ALT 0 - 44 U/L 10   CBC Latest Ref Rng & Units 08/11/2020  WBC 4.0 - 10.5 K/uL 4.8  Hemoglobin 12.0 - 15.0 g/dL 9.9(L)  Hematocrit 36 - 46 % 29.6(L)  Platelets 150 - 400 K/uL 300    Assessment and plan- Patient is a 86 y.o. female with history of rheumatoid arthritis and rheumatoid lung referred for macrocytic anemia  Macrocytic anemia: B12 levels are borderline low at 277 and I will start off with B12 supplementation.  Patient will receive her B12 shot today.  I will also check folate TSH reticulocyte count and myeloma panel haptoglobin and serum free light chains today along with CBC with differential and CMP.  If anemia does not improve despite B12 supplementation and if the results of above peripheral blood work are otherwise unremarkable possibilities include anemia of chronic disease versus myelodysplastic syndrome.  Discussed that myelodysplastic syndrome can be age-related with development of progressive macrocytic anemia and the diagnosis can only be made on a bone marrow biopsy.  If her hemoglobin remains stable close to 10 we do not have to proceed with a bone marrow biopsy at this time but it could be considered down the line if there is a significant decline in her anemia.  I will see her back  in 2 weeks for an in  person visit to discuss the results of above blood work   Thank you for this kind referral and the opportunity to participate in the care of this patient   Visit Diagnosis 1. Macrocytic anemia     Dr. Randa Evens, MD, MPH [facility] [phone] 08/11/2020 4:13 PM

## 2020-08-12 LAB — MULTIPLE MYELOMA PANEL, SERUM
Albumin SerPl Elph-Mcnc: 3.8 g/dL (ref 2.9–4.4)
Albumin/Glob SerPl: 1.1 (ref 0.7–1.7)
Alpha 1: 0.4 g/dL (ref 0.0–0.4)
Alpha2 Glob SerPl Elph-Mcnc: 1 g/dL (ref 0.4–1.0)
B-Globulin SerPl Elph-Mcnc: 1 g/dL (ref 0.7–1.3)
Gamma Glob SerPl Elph-Mcnc: 1.2 g/dL (ref 0.4–1.8)
Globulin, Total: 3.6 g/dL (ref 2.2–3.9)
IgA: 142 mg/dL (ref 64–422)
IgG (Immunoglobin G), Serum: 1104 mg/dL (ref 586–1602)
IgM (Immunoglobulin M), Srm: 253 mg/dL — ABNORMAL HIGH (ref 26–217)
Total Protein ELP: 7.4 g/dL (ref 6.0–8.5)

## 2020-08-12 LAB — KAPPA/LAMBDA LIGHT CHAINS
Kappa free light chain: 29.9 mg/L — ABNORMAL HIGH (ref 3.3–19.4)
Kappa, lambda light chain ratio: 0.98 (ref 0.26–1.65)
Lambda free light chains: 30.5 mg/L — ABNORMAL HIGH (ref 5.7–26.3)

## 2020-08-12 LAB — ERYTHROPOIETIN: Erythropoietin: 100.3 m[IU]/mL — ABNORMAL HIGH (ref 2.6–18.5)

## 2020-08-12 LAB — HAPTOGLOBIN: Haptoglobin: 268 mg/dL (ref 41–333)

## 2020-08-12 NOTE — Progress Notes (Signed)
Thank you for evaluating this very nice and kind lady.

## 2020-08-18 ENCOUNTER — Encounter: Payer: Self-pay | Admitting: Pulmonary Disease

## 2020-08-19 ENCOUNTER — Other Ambulatory Visit: Payer: Self-pay

## 2020-08-19 ENCOUNTER — Inpatient Hospital Stay: Payer: Medicare Other

## 2020-08-19 ENCOUNTER — Inpatient Hospital Stay (HOSPITAL_BASED_OUTPATIENT_CLINIC_OR_DEPARTMENT_OTHER): Payer: Medicare Other | Admitting: Oncology

## 2020-08-19 VITALS — BP 125/55 | HR 84 | Temp 98.1°F | Resp 18 | Wt 122.7 lb

## 2020-08-19 DIAGNOSIS — D539 Nutritional anemia, unspecified: Secondary | ICD-10-CM

## 2020-08-19 DIAGNOSIS — E538 Deficiency of other specified B group vitamins: Secondary | ICD-10-CM | POA: Diagnosis not present

## 2020-08-19 DIAGNOSIS — R0602 Shortness of breath: Secondary | ICD-10-CM | POA: Diagnosis not present

## 2020-08-19 DIAGNOSIS — R5383 Other fatigue: Secondary | ICD-10-CM | POA: Diagnosis not present

## 2020-08-19 DIAGNOSIS — M069 Rheumatoid arthritis, unspecified: Secondary | ICD-10-CM | POA: Diagnosis not present

## 2020-08-19 DIAGNOSIS — J84112 Idiopathic pulmonary fibrosis: Secondary | ICD-10-CM | POA: Diagnosis not present

## 2020-08-20 ENCOUNTER — Telehealth: Payer: Self-pay

## 2020-08-20 DIAGNOSIS — J849 Interstitial pulmonary disease, unspecified: Secondary | ICD-10-CM | POA: Diagnosis not present

## 2020-08-20 NOTE — Telephone Encounter (Signed)
ONO reviewed by Dr. Verda Cumins need for oxygen. Patient is aware and voiced her understanding.  Nothing further needed.

## 2020-08-22 ENCOUNTER — Telehealth: Payer: Self-pay | Admitting: Pulmonary Disease

## 2020-08-22 NOTE — Progress Notes (Signed)
Hematology/Oncology Consult note [facility]  [phone] [phone]  Patient Care Team: Pleas Koch, NP as PCP - General (Internal Medicine) Minna Merritts, MD as PCP - Cardiology (Cardiology) Minna Merritts, MD as Consulting Physician (Cardiology) Marlowe Sax, MD as Consulting Physician (Rheumatology)   Name of the patient: Crystal Gregory  [id number]  11-18-1931   Date of visit: 08/22/20  Diagnosis-macrocytic anemia likely anemia of chronic disease versus possible MDS  Chief complaint/ Reason for visit-discussed results of blood work  Heme/Onc history: Patient is a 86 year old female referred for macrocytic anemia.  She has a prior history of rheumatoid arthritis as well as interstitial lung disease from rheumatoid arthritis as well.  She is not on home oxygen.  She has been referred for anemia.  Dating back at her CBC is her hemoglobin was always between 10-11 even back in 2018 and 2019.  Her most recent CBC from 08/06/2020 showed white count of 4.7, H&H of 9.5/28.8 with an MCV of 114 and platelet count of 276.  She has developed progressive macrocytosis since 2019.  B12 levels were mildly low at 277.  Patient reports chronic fatigue and exertional shortness of breath.  She reports having decent amount of energy after 4 PM and following that her energy levels dropped significantly.  Recent iron studies were normal.  She is on Imuran for rheumatoid arthritis  Results of blood work from 08/11/2020 were as follows: CBC showed white count of 4.8, H&H of 9.9/29.6 with an MCV of 114 and a platelet count of 300.  B12 level was low at 277.  Folate and TSH was normal.  EPO level was elevated at 100.  Haptoglobin was normal.  Myeloma panel showed polyclonal increase but no monoclonal protein.  Reticulocyte count was inappropriately low for the degree of anemia at 2.2%.  CMP showed normal kidney and liver functions.  Interval  history-patient reports chronic fatigue and exertional shortness of breath.  Denies any new symptoms at this time1  ECOG PS- 1 Pain scale- 0   Review of systems- Review of Systems  Constitutional: Positive for malaise/fatigue. Negative for chills, fever and weight loss.  HENT: Negative for congestion, ear discharge and nosebleeds.   Eyes: Negative for blurred vision.  Respiratory: Positive for shortness of breath. Negative for cough, hemoptysis, sputum production and wheezing.   Cardiovascular: Negative for chest pain, palpitations, orthopnea and claudication.  Gastrointestinal: Negative for abdominal pain, blood in stool, constipation, diarrhea, heartburn, melena, nausea and vomiting.  Genitourinary: Negative for dysuria, flank pain, frequency, hematuria and urgency.  Musculoskeletal: Negative for back pain, joint pain and myalgias.  Skin: Negative for rash.  Neurological: Negative for dizziness, tingling, focal weakness, seizures, weakness and headaches.  Endo/Heme/Allergies: Does not bruise/bleed easily.  Psychiatric/Behavioral: Negative for depression and suicidal ideas. The patient does not have insomnia.       No Known Allergies   Past Medical History:  Diagnosis Date  . Allergy   . CAP (community acquired pneumonia) 08/23/2017  . Chest pain    a. 09/04/2018 MV: EF >65%. No ischemia/infarct.  . Coronary artery calcification seen on CT scan   . Diastolic dysfunction    a. 10/2016 Echo: EF 60-65%, no rwma, Gr1 DD, mild MR, nl RV fxn, mild to mod TR, PASP 66mmHg.  Marland Kitchen Dyspnea   . GERD (gastroesophageal reflux disease)   . Interstitial lung disorders ([employer])   . Pulmonary fibrosis ([place])   . Septicemia ([place]) 1999   spent  4 weeks in ICU, intubated -? PNA     Past Surgical History:  Procedure Laterality Date  . ABDOMINAL HYSTERECTOMY    . FLEXIBLE BRONCHOSCOPY N/A 07/05/2016   Procedure: FLEXIBLE BRONCHOSCOPY;  Surgeon: Vilinda Boehringer, MD;  Location: [facility] ORS;  Service:  Cardiopulmonary;  Laterality: N/A;    Social History   Socioeconomic History  . Marital status: Married    Spouse name: Not on file  . Number of children: 2  . Years of education: Not on file  . Highest education level: Not on file  Occupational History  . Occupation: Retired   Tobacco Use  . Smoking status: Former Smoker    Packs/day: 0.50    Years: 30.00    Pack years: 15.00    Quit date: 01/16/1981    Years since quitting: 39.6  . Smokeless tobacco: Never Used  Vaping Use  . Vaping Use: Never used  Substance and Sexual Activity  . Alcohol use: Yes    Comment: 2 drinks daily   . Drug use: No  . Sexual activity: Not on file  Other Topics Concern  . Not on file  Social History Narrative   Married.     2 children.  Lives with her husband in [place].   Social Determinants of Health   Financial Resource Strain:   . Difficulty of Paying Living Expenses: Not on file  Food Insecurity:   . Worried About Charity fundraiser in the Last Year: Not on file  . Ran Out of Food in the Last Year: Not on file  Transportation Needs:   . Lack of Transportation (Medical): Not on file  . Lack of Transportation (Non-Medical): Not on file  Physical Activity:   . Days of Exercise per Week: Not on file  . Minutes of Exercise per Session: Not on file  Stress:   . Feeling of Stress : Not on file  Social Connections:   . Frequency of Communication with Friends and Family: Not on file  . Frequency of Social Gatherings with Friends and Family: Not on file  . Attends Religious Services: Not on file  . Active Member of Clubs or Organizations: Not on file  . Attends Archivist Meetings: Not on file  . Marital Status: Not on file  Intimate Partner Violence:   . Fear of Current or Ex-Partner: Not on file  . Emotionally Abused: Not on file  . Physically Abused: Not on file  . Sexually Abused: Not on file    Family History  Problem Relation Age of Onset  . Parkinson's disease  Mother   . Colon polyps Mother 38          . Breast cancer Sister      Current Outpatient Medications:  .  azaTHIOprine (IMURAN) 50 MG tablet, Take 75 mg every morning and 50 mg every evening., Disp: 75 tablet, Rfl: 0 .  azithromycin (ZITHROMAX) 250 MG tablet, Take 1 tablet (250 mg total) by mouth 3 (three) times a week., Disp: 36 each, Rfl: 1 .  benzonatate (TESSALON) 200 MG capsule, Take 1 capsule (200 mg total) by mouth in the morning and at bedtime., Disp: 30 capsule, Rfl: 2 .  denosumab (PROLIA) 60 MG/ML SOLN injection, Inject 60 mg into the skin every 6 (six) months. Administer in upper arm, thigh, or abdomen, Disp: , Rfl:  .  gabapentin (NEURONTIN) 100 MG capsule, Take 1 capsule (100 mg total) by mouth at bedtime., Disp: 90 capsule, Rfl: 1 .  montelukast (SINGULAIR) 10 MG tablet, Take 1 tablet (10 mg total) by mouth daily., Disp: 90 tablet, Rfl: 1 .  pantoprazole (PROTONIX) 20 MG tablet, Take 1 tablet (20 mg total) by mouth 2 (two) times daily. For heartburn., Disp: 180 tablet, Rfl: 3 .  predniSONE (DELTASONE) 1 MG tablet, Take 2 mg by mouth daily with breakfast., Disp: , Rfl:  .  propranolol (INDERAL) 10 MG tablet, TAKE 1 TABLET TWICE A DAY FOR TREMOR, Disp: 180 tablet, Rfl: 3 .  traZODone (DESYREL) 50 MG tablet, TAKE 1 TABLET AT BEDTIME FOR SLEEP, Disp: 90 tablet, Rfl: 3 .  nitroGLYCERIN (NITROSTAT) 0.4 MG SL tablet, Place 1 tablet (0.4 mg total) under the tongue every 5 (five) minutes as needed for chest pain. Maximum 3 doses., Disp: 25 tablet, Rfl: 3  Physical exam:  Vitals:   08/19/20 1439  BP: (!) 125/55  Pulse: 84  Resp: 18  Temp: 98.1 F (36.7 C)  TempSrc: Tympanic  SpO2: 100%  Weight: 122 lb 11.2 oz (55.7 kg)   Physical Exam Constitutional:      General: She is not in acute distress. Pulmonary:     Effort: Pulmonary effort is normal.  Skin:    General: Skin is warm and dry.  Neurological:     Mental Status: She is alert and oriented to person, place, and time.        CMP Latest Ref Rng & Units 08/11/2020  Glucose 70 - 99 mg/dL 107(H)  BUN 8 - 23 mg/dL 17  Creatinine 0.44 - 1.00 mg/dL 0.72  Sodium 135 - 145 mmol/L 135  Potassium 3.5 - 5.1 mmol/L 4.7  Chloride 98 - 111 mmol/L 98  CO2 22 - 32 mmol/L 28  Calcium 8.9 - 10.3 mg/dL 9.6  Total Protein 6.5 - 8.1 g/dL 8.2(H)  Total Bilirubin 0.3 - 1.2 mg/dL 0.9  Alkaline Phos 38 - 126 U/L 44  AST 15 - 41 U/L 21  ALT 0 - 44 U/L 10   CBC Latest Ref Rng & Units 08/11/2020  WBC 4.0 - 10.5 K/uL 4.8  Hemoglobin 12.0 - 15.0 g/dL 9.9(L)  Hematocrit 36 - 46 % 29.6(L)  Platelets 150 - 400 K/uL 300      Assessment and plan- Patient is a 86 y.o. female referred for macrocytic anemia possibly secondary to anemia of chronic disease versus MDS  Discussed results of peripheral blood anemia work-up which was consistent with a mild B12 deficiency for which she will be receiving monthly B12 shots.  It is unclear if the anemia can be entirely attributed to B12 deficiency alone.  Patient has rheumatoid arthritis and rheumatoid lung secondary to it and is on Imuran for that.  Also given her macrocytosis and her age MDS is a possibility.  Given that her hemoglobin is close to 10 at this time I am inclined to monitor this without a bone marrow biopsy and repeating levels after adequate B12 supplementation.  I will repeat CBC with differential and B12 levels in 6 weeks.  If her hemoglobin drifts down to less than 9 despite B12 supplementation I will proceed with a bone marrow biopsy at that time.  Patient verbalized understanding of my plan   Visit Diagnosis 1. Macrocytic anemia      Dr. Randa Evens, MD, MPH [facility] at [facility] [phone] 08/22/2020 8:40 AM

## 2020-08-22 NOTE — Telephone Encounter (Signed)
Patient is aware of date/time of covid test prior to PFT.  

## 2020-08-25 ENCOUNTER — Other Ambulatory Visit: Payer: Self-pay

## 2020-08-25 ENCOUNTER — Other Ambulatory Visit
Admission: RE | Admit: 2020-08-25 | Discharge: 2020-08-25 | Disposition: A | Discharge status: Home / Self Care | Payer: Medicare Other | Source: Ambulatory Visit | Attending: Pulmonary Disease | Admitting: Pulmonary Disease

## 2020-08-25 DIAGNOSIS — Z20822 Contact with and (suspected) exposure to covid-19: Secondary | ICD-10-CM | POA: Insufficient documentation

## 2020-08-25 DIAGNOSIS — Z01812 Encounter for preprocedural laboratory examination: Secondary | ICD-10-CM | POA: Insufficient documentation

## 2020-08-25 LAB — SARS CORONAVIRUS 2 (TAT 6-24 HRS): SARS Coronavirus 2: NEGATIVE

## 2020-08-26 ENCOUNTER — Ambulatory Visit: Payer: Medicare Other | Attending: Pulmonary Disease

## 2020-08-26 DIAGNOSIS — R0602 Shortness of breath: Secondary | ICD-10-CM | POA: Diagnosis not present

## 2020-08-26 MED ORDER — ALBUTEROL SULFATE (2.5 MG/3ML) 0.083% IN NEBU
2.5000 mg | INHALATION_SOLUTION | Freq: Once | RESPIRATORY_TRACT | Status: AC
  Administered 2020-08-26: 2.5 mg via RESPIRATORY_TRACT
  Filled 2020-08-26: qty 3

## 2020-08-27 MED ORDER — GABAPENTIN 100 MG PO CAPS: 100.0000 mg | ORAL_CAPSULE | Freq: Every day | ORAL | 1 refills | Status: DC

## 2020-08-31 LAB — PULMONARY FUNCTION TEST ARMC ONLY
DL/VA % pred: 38 %
DL/VA: 1.5 ml/min/mmHg/L
DLCO unc % pred: 31 %
DLCO unc: 6.58 ml/min/mmHg
FEF 25-75 Post: 1.59 L/sec
FEF 25-75 Pre: 3.22 L/sec
FEF2575-%Change-Post: -50 %
FEF2575-%Pred-Post: 119 %
FEF2575-%Pred-Pre: 241 %
FEV1-%Change-Post: -17 %
FEV1-%Pred-Post: 84 %
FEV1-%Pred-Pre: 102 %
FEV1-Post: 1.78 L
FEV1-Pre: 2.16 L
FEV1FVC-%Change-Post: -14 %
FEV1FVC-%Pred-Pre: 129 %
FEV6-%Change-Post: -3 %
FEV6-%Pred-Post: 82 %
FEV6-%Pred-Pre: 85 %
FEV6-Post: 2.22 L
FEV6-Pre: 2.3 L
FEV6FVC-%Pred-Post: 105 %
FEV6FVC-%Pred-Pre: 105 %
FVC-%Change-Post: -3 %
FVC-%Pred-Post: 78 %
FVC-%Pred-Pre: 80 %
FVC-Post: 2.22 L
FVC-Pre: 2.3 L
Post FEV1/FVC ratio: 80 %
Post FEV6/FVC ratio: 100 %
Pre FEV1/FVC ratio: 94 %
Pre FEV6/FVC Ratio: 100 %
RV % pred: 92 %
RV: 2.49 L
TLC % pred: 78 %
TLC: 4.46 L

## 2020-09-02 ENCOUNTER — Inpatient Hospital Stay: Payer: Medicare Other

## 2020-09-02 ENCOUNTER — Other Ambulatory Visit: Payer: Self-pay

## 2020-09-02 ENCOUNTER — Other Ambulatory Visit: Payer: Self-pay | Admitting: Oncology

## 2020-09-02 DIAGNOSIS — D539 Nutritional anemia, unspecified: Secondary | ICD-10-CM | POA: Diagnosis not present

## 2020-09-02 DIAGNOSIS — E538 Deficiency of other specified B group vitamins: Secondary | ICD-10-CM

## 2020-09-02 DIAGNOSIS — J84112 Idiopathic pulmonary fibrosis: Secondary | ICD-10-CM | POA: Diagnosis not present

## 2020-09-02 DIAGNOSIS — M069 Rheumatoid arthritis, unspecified: Secondary | ICD-10-CM | POA: Diagnosis not present

## 2020-09-02 DIAGNOSIS — R0602 Shortness of breath: Secondary | ICD-10-CM | POA: Diagnosis not present

## 2020-09-02 DIAGNOSIS — R5383 Other fatigue: Secondary | ICD-10-CM | POA: Diagnosis not present

## 2020-09-02 MED ORDER — CYANOCOBALAMIN 1000 MCG/ML IJ SOLN
1000.0000 ug | INTRAMUSCULAR | Status: DC
  Administered 2020-09-02: 1000 ug via INTRAMUSCULAR
  Filled 2020-09-02: qty 1

## 2020-09-03 ENCOUNTER — Telehealth: Payer: Self-pay

## 2020-09-03 NOTE — Telephone Encounter (Signed)
Last prolia on 03/18/20.  Next inj due after 09/18/20.  Need benefits run. Sent msg to [employer]

## 2020-09-04 MED ORDER — GABAPENTIN 100 MG PO CAPS: 200.0000 mg | ORAL_CAPSULE | Freq: Every day | ORAL | 1 refills | Status: DC

## 2020-09-05 NOTE — Telephone Encounter (Signed)
Have submitted benefits online today. Do we have a sheet started on her for the book?

## 2020-09-19 NOTE — Telephone Encounter (Signed)
Benefits report pt will owe $0 for prolia and no PA is needed.  Pt needs to be scheduled for labs and NV for inj.

## 2020-09-30 DIAGNOSIS — J849 Interstitial pulmonary disease, unspecified: Secondary | ICD-10-CM | POA: Diagnosis not present

## 2020-09-30 DIAGNOSIS — M26609 Unspecified temporomandibular joint disorder, unspecified side: Secondary | ICD-10-CM | POA: Diagnosis not present

## 2020-09-30 DIAGNOSIS — M059 Rheumatoid arthritis with rheumatoid factor, unspecified: Secondary | ICD-10-CM | POA: Diagnosis not present

## 2020-09-30 DIAGNOSIS — Z79899 Other long term (current) drug therapy: Secondary | ICD-10-CM | POA: Diagnosis not present

## 2020-10-02 ENCOUNTER — Inpatient Hospital Stay: Payer: Medicare Other

## 2020-10-02 ENCOUNTER — Encounter: Payer: Self-pay | Admitting: Oncology

## 2020-10-02 ENCOUNTER — Inpatient Hospital Stay: Payer: Medicare Other | Attending: Oncology

## 2020-10-02 ENCOUNTER — Other Ambulatory Visit: Payer: Self-pay

## 2020-10-02 ENCOUNTER — Inpatient Hospital Stay (HOSPITAL_BASED_OUTPATIENT_CLINIC_OR_DEPARTMENT_OTHER): Payer: Medicare Other | Admitting: Oncology

## 2020-10-02 VITALS — BP 140/60 | HR 79 | Temp 97.8°F | Resp 16 | Wt 121.3 lb

## 2020-10-02 DIAGNOSIS — D638 Anemia in other chronic diseases classified elsewhere: Secondary | ICD-10-CM | POA: Diagnosis not present

## 2020-10-02 DIAGNOSIS — R5382 Chronic fatigue, unspecified: Secondary | ICD-10-CM | POA: Insufficient documentation

## 2020-10-02 DIAGNOSIS — J849 Interstitial pulmonary disease, unspecified: Secondary | ICD-10-CM | POA: Diagnosis not present

## 2020-10-02 DIAGNOSIS — D7589 Other specified diseases of blood and blood-forming organs: Secondary | ICD-10-CM | POA: Diagnosis not present

## 2020-10-02 DIAGNOSIS — E538 Deficiency of other specified B group vitamins: Secondary | ICD-10-CM

## 2020-10-02 DIAGNOSIS — D539 Nutritional anemia, unspecified: Secondary | ICD-10-CM | POA: Insufficient documentation

## 2020-10-02 DIAGNOSIS — Z87891 Personal history of nicotine dependence: Secondary | ICD-10-CM | POA: Insufficient documentation

## 2020-10-02 DIAGNOSIS — K219 Gastro-esophageal reflux disease without esophagitis: Secondary | ICD-10-CM | POA: Insufficient documentation

## 2020-10-02 DIAGNOSIS — Z803 Family history of malignant neoplasm of breast: Secondary | ICD-10-CM | POA: Insufficient documentation

## 2020-10-02 DIAGNOSIS — Z8371 Family history of colonic polyps: Secondary | ICD-10-CM | POA: Insufficient documentation

## 2020-10-02 DIAGNOSIS — Z8269 Family history of other diseases of the musculoskeletal system and connective tissue: Secondary | ICD-10-CM | POA: Diagnosis not present

## 2020-10-02 DIAGNOSIS — Z79899 Other long term (current) drug therapy: Secondary | ICD-10-CM | POA: Diagnosis not present

## 2020-10-02 DIAGNOSIS — R0602 Shortness of breath: Secondary | ICD-10-CM | POA: Diagnosis not present

## 2020-10-02 DIAGNOSIS — M069 Rheumatoid arthritis, unspecified: Secondary | ICD-10-CM | POA: Diagnosis not present

## 2020-10-02 LAB — CBC WITH DIFFERENTIAL/PLATELET
Abs Immature Granulocytes: 0.03 10*3/uL (ref 0.00–0.07)
Basophils Absolute: 0 10*3/uL (ref 0.0–0.1)
Basophils Relative: 1 %
Eosinophils Absolute: 0.1 10*3/uL (ref 0.0–0.5)
Eosinophils Relative: 1 %
HCT: 30 % — ABNORMAL LOW (ref 36.0–46.0)
Hemoglobin: 10.2 g/dL — ABNORMAL LOW (ref 12.0–15.0)
Immature Granulocytes: 1 %
Lymphocytes Relative: 28 %
Lymphs Abs: 1.5 10*3/uL (ref 0.7–4.0)
MCH: 38.2 pg — ABNORMAL HIGH (ref 26.0–34.0)
MCHC: 34 g/dL (ref 30.0–36.0)
MCV: 112.4 fL — ABNORMAL HIGH (ref 80.0–100.0)
Monocytes Absolute: 0.6 10*3/uL (ref 0.1–1.0)
Monocytes Relative: 11 %
Neutro Abs: 3.2 10*3/uL (ref 1.7–7.7)
Neutrophils Relative %: 58 %
Platelets: 364 10*3/uL (ref 150–400)
RBC: 2.67 MIL/uL — ABNORMAL LOW (ref 3.87–5.11)
RDW: 15.1 % (ref 11.5–15.5)
WBC: 5.4 10*3/uL (ref 4.0–10.5)
nRBC: 0 % (ref 0.0–0.2)

## 2020-10-02 LAB — VITAMIN B12: Vitamin B-12: 473 pg/mL (ref 180–914)

## 2020-10-02 MED ORDER — CYANOCOBALAMIN 1000 MCG/ML IJ SOLN
1000.0000 ug | INTRAMUSCULAR | Status: DC
  Administered 2020-10-02: 10:00:00 1000 ug via INTRAMUSCULAR
  Filled 2020-10-02: qty 1

## 2020-10-02 MED ORDER — CYANOCOBALAMIN 1000 MCG/ML IJ SOLN: 1000.0000 ug | INTRAMUSCULAR | Status: DC

## 2020-10-02 NOTE — Progress Notes (Signed)
Hematology/Oncology Consult note [facility]  [phone] [phone]  Patient Care Team: Pleas Koch, NP as PCP - General (Internal Medicine) Minna Merritts, MD as PCP - Cardiology (Cardiology) Minna Merritts, MD as Consulting Physician (Cardiology) Marlowe Sax, MD as Consulting Physician (Rheumatology)   Name of the patient: Crystal Gregory  [id number]  1931-11-05   Date of visit: 10/02/20  Diagnosis- macrocytic anemia likely anemia of chronic disease and component of B12 deficiency versus possible MDS  Chief complaint/ Reason for visit-routine follow-up of anemia  Heme/Onc history: Patient is a 86 year old female referred for macrocytic anemia. She has a prior history of rheumatoid arthritis as well as interstitial lung disease from rheumatoid arthritis as well. She is not on home oxygen. She has been referred for anemia. Dating back at her CBC is her hemoglobin was always between 10-11 even back in 2018 and 2019. Her most recent CBC from 08/06/2020 showed white count of 4.7, H&H of 9.5/28.8 with an MCV of 114 and platelet count of 276. She has developed progressive macrocytosis since 2019. B12 levels were mildly low at 277. Patient reports chronic fatigue and exertional shortness of breath. She reports having decent amount of energy after 4 PM and following that her energy levels dropped significantly. Recent iron studies were normal.She is on Imuran for rheumatoid arthritis  Results of blood work from 08/11/2020 were as follows: CBC showed white count of 4.8, H&H of 9.9/29.6 with an MCV of 114 and a platelet count of 300.  B12 level was low at 277.  Folate and TSH was normal.  EPO level was elevated at 100.  Haptoglobin was normal.  Myeloma panel showed polyclonal increase but no monoclonal protein.  Reticulocyte count was inappropriately low for the degree of anemia at 2.2%.  CMP showed normal kidney and  liver functions.   Interval history- she has chronic fatigue and exertional sob which is unchanged.   ECOG PS- 1 Pain scale- 0   Review of systems- Review of Systems  Constitutional: Positive for malaise/fatigue. Negative for chills, fever and weight loss.  HENT: Negative for congestion, ear discharge and nosebleeds.   Eyes: Negative for blurred vision.  Respiratory: Negative for cough, hemoptysis, sputum production, shortness of breath and wheezing.   Cardiovascular: Negative for chest pain, palpitations, orthopnea and claudication.  Gastrointestinal: Negative for abdominal pain, blood in stool, constipation, diarrhea, heartburn, melena, nausea and vomiting.  Genitourinary: Negative for dysuria, flank pain, frequency, hematuria and urgency.  Musculoskeletal: Negative for back pain, joint pain and myalgias.  Skin: Negative for rash.  Neurological: Negative for dizziness, tingling, focal weakness, seizures, weakness and headaches.  Endo/Heme/Allergies: Does not bruise/bleed easily.  Psychiatric/Behavioral: Negative for depression and suicidal ideas. The patient does not have insomnia.       No Known Allergies   Past Medical History:  Diagnosis Date  . Allergy   . CAP (community acquired pneumonia) 08/23/2017  . Chest pain    a. 09/04/2018 MV: EF >65%. No ischemia/infarct.  . Coronary artery calcification seen on CT scan   . Diastolic dysfunction    a. 10/2016 Echo: EF 60-65%, no rwma, Gr1 DD, mild MR, nl RV fxn, mild to mod TR, PASP 34mmHg.  Marland Kitchen Dyspnea   . GERD (gastroesophageal reflux disease)   . Interstitial lung disorders ([place])   . Pulmonary fibrosis ([place])   . Septicemia ([place]) 1999   spent 4 weeks in ICU, intubated -? PNA     Past Surgical  History:  Procedure Laterality Date  . ABDOMINAL HYSTERECTOMY    . FLEXIBLE BRONCHOSCOPY N/A 07/05/2016   Procedure: FLEXIBLE BRONCHOSCOPY;  Surgeon: Vilinda Boehringer, MD;  Location: [facility] ORS;  Service: Cardiopulmonary;  Laterality:  N/A;    Social History   Socioeconomic History  . Marital status: Married    Spouse name: Not on file  . Number of children: 2  . Years of education: Not on file  . Highest education level: Not on file  Occupational History  . Occupation: Retired   Tobacco Use  . Smoking status: Former Smoker    Packs/day: 0.50    Years: 30.00    Pack years: 15.00    Quit date: 01/16/1981    Years since quitting: 39.7  . Smokeless tobacco: Never Used  Vaping Use  . Vaping Use: Never used  Substance and Sexual Activity  . Alcohol use: Yes    Comment: 2 drinks daily   . Drug use: No  . Sexual activity: Not on file  Other Topics Concern  . Not on file  Social History Narrative   Married.     2 children.  Lives with her husband in [place].   Social Determinants of Health   Financial Resource Strain: Not on file  Food Insecurity: Not on file  Transportation Needs: Not on file  Physical Activity: Not on file  Stress: Not on file  Social Connections: Not on file  Intimate Partner Violence: Not on file    Family History  Problem Relation Age of Onset  . Parkinson's disease Mother   . Colon polyps Mother 24          . Breast cancer Sister      Current Outpatient Medications:  .  azaTHIOprine (IMURAN) 50 MG tablet, Take 75 mg every morning and 50 mg every evening., Disp: 75 tablet, Rfl: 0 .  azithromycin (ZITHROMAX) 250 MG tablet, Take 1 tablet (250 mg total) by mouth 3 (three) times a week., Disp: 36 each, Rfl: 1 .  benzonatate (TESSALON) 200 MG capsule, Take 1 capsule (200 mg total) by mouth in the morning and at bedtime., Disp: 30 capsule, Rfl: 2 .  denosumab (PROLIA) 60 MG/ML SOLN injection, Inject 60 mg into the skin every 6 (six) months. Administer in upper arm, thigh, or abdomen, Disp: , Rfl:  .  gabapentin (NEURONTIN) 100 MG capsule, Take 2 capsules (200 mg total) by mouth at bedtime., Disp: 180 capsule, Rfl: 1 .  montelukast (SINGULAIR) 10 MG tablet, Take 1 tablet (10 mg  total) by mouth daily., Disp: 90 tablet, Rfl: 1 .  nitroGLYCERIN (NITROSTAT) 0.4 MG SL tablet, Place 1 tablet (0.4 mg total) under the tongue every 5 (five) minutes as needed for chest pain. Maximum 3 doses., Disp: 25 tablet, Rfl: 3 .  pantoprazole (PROTONIX) 20 MG tablet, Take 1 tablet (20 mg total) by mouth 2 (two) times daily. For heartburn., Disp: 180 tablet, Rfl: 3 .  predniSONE (DELTASONE) 1 MG tablet, Take 2 mg by mouth daily with breakfast., Disp: , Rfl:  .  propranolol (INDERAL) 10 MG tablet, TAKE 1 TABLET TWICE A DAY FOR TREMOR, Disp: 180 tablet, Rfl: 3 .  traZODone (DESYREL) 50 MG tablet, TAKE 1 TABLET AT BEDTIME FOR SLEEP, Disp: 90 tablet, Rfl: 3  Physical exam: There were no vitals filed for this visit. Physical Exam Constitutional:      Comments: Thin elderly female in no acute distress  Eyes:     Extraocular Movements: EOM normal.  Pupils: Pupils are equal, round, and reactive to light.  Pulmonary:     Effort: Pulmonary effort is normal.  Skin:    General: Skin is warm and dry.  Neurological:     Mental Status: She is alert and oriented to person, place, and time.      CMP Latest Ref Rng & Units 08/11/2020  Glucose 70 - 99 mg/dL 016(W)  BUN 8 - 23 mg/dL 17  Creatinine 1.09 - 3.23 mg/dL 5.57  Sodium 322 - 025 mmol/L 135  Potassium 3.5 - 5.1 mmol/L 4.7  Chloride 98 - 111 mmol/L 98  CO2 22 - 32 mmol/L 28  Calcium 8.9 - 10.3 mg/dL 9.6  Total Protein 6.5 - 8.1 g/dL 8.2(H)  Total Bilirubin 0.3 - 1.2 mg/dL 0.9  Alkaline Phos 38 - 126 U/L 44  AST 15 - 41 U/L 21  ALT 0 - 44 U/L 10   CBC Latest Ref Rng & Units 08/11/2020  WBC 4.0 - 10.5 K/uL 4.8  Hemoglobin 12.0 - 15.0 g/dL 4.2(H)  Hematocrit 06.2 - 46.0 % 29.6(L)  Platelets 150 - 400 K/uL 300      Assessment and plan- Patient is a 86 y.o. female with macrocytic anemia likely multifactorial secondary to B12 deficiency and anemia of chronic disease.  This is a routine follow-up visit  Anemia presently stable  between 9-10. Persistent macrocytosis possibly due to MDS. B12 has normalized but anemia has not improved significantly. She will continue to take po b12. If cytopenias worsen, I will conisder BM biopsy at that time to rule out MDS. It will not change management presently.  See me in 3 months with labs   Visit Diagnosis 1. B12 deficiency   2. Anemia of chronic disease      Dr. Owens Shark, MD, MPH [facility] at [facility] [phone] 10/02/2020

## 2020-10-25 ENCOUNTER — Telehealth: Payer: Self-pay

## 2020-10-25 DIAGNOSIS — M810 Age-related osteoporosis without current pathological fracture: Secondary | ICD-10-CM

## 2020-10-25 NOTE — Telephone Encounter (Signed)
Unable to get pt scheduled for labs and NV so benefits had to be resubmitted. Starting new telephone encounter for new year.

## 2020-10-25 NOTE — Telephone Encounter (Signed)
Pt last prolia inj was on 03/18/20. Next prolia due after 09/18/20. Unable to get lab and NV in before end of year.    Benefits have been resubmitted and interpreted by Joycelyn Schmid. Pt will $0 but Joycelyn Schmid reports pt may have some deductible to pay with her insurance. No PA was needed  Need to contact pt to advise of cost and set up for lab and NV.

## 2020-11-10 NOTE — Telephone Encounter (Signed)
Can you please check the status of this for the patient as she is calling back to see when she can get scheduled for this being she is overdue. Thank you EM

## 2020-11-12 NOTE — Telephone Encounter (Signed)
LVM to try to schedule

## 2020-11-12 NOTE — Telephone Encounter (Signed)
Advised pt of potential cost. Scheduled for lab on 2/10 and prolia inj on 2/16. Pt verbalized understanding.   Added lab order

## 2020-11-12 NOTE — Addendum Note (Signed)
Addended by: Randall An on: 11/12/2020 03:48 PM   Modules accepted: Orders

## 2020-11-13 ENCOUNTER — Other Ambulatory Visit (INDEPENDENT_AMBULATORY_CARE_PROVIDER_SITE_OTHER): Payer: Medicare Other

## 2020-11-13 ENCOUNTER — Other Ambulatory Visit: Payer: Self-pay

## 2020-11-13 DIAGNOSIS — M810 Age-related osteoporosis without current pathological fracture: Secondary | ICD-10-CM

## 2020-11-13 LAB — BASIC METABOLIC PANEL
BUN: 17 mg/dL (ref 6–23)
CO2: 30 mEq/L (ref 19–32)
Calcium: 9.6 mg/dL (ref 8.4–10.5)
Chloride: 100 mEq/L (ref 96–112)
Creatinine, Ser: 0.79 mg/dL (ref 0.40–1.20)
GFR: 67.51 mL/min (ref >60.00)
Glucose, Bld: 93 mg/dL (ref 70–99)
Potassium: 4 mEq/L (ref 3.5–5.1)
Sodium: 138 mEq/L (ref 135–145)

## 2020-11-14 NOTE — Telephone Encounter (Signed)
Ca normal and CrCl is 44.82mL/min Ok for inj.

## 2020-11-19 ENCOUNTER — Other Ambulatory Visit: Payer: Self-pay

## 2020-11-19 ENCOUNTER — Ambulatory Visit (INDEPENDENT_AMBULATORY_CARE_PROVIDER_SITE_OTHER): Payer: Medicare Other

## 2020-11-19 DIAGNOSIS — M810 Age-related osteoporosis without current pathological fracture: Secondary | ICD-10-CM | POA: Diagnosis not present

## 2020-11-19 MED ORDER — DENOSUMAB 60 MG/ML ~~LOC~~ SOSY
60.0000 mg | PREFILLED_SYRINGE | Freq: Once | SUBCUTANEOUS | Status: AC
Start: 2020-11-19 — End: 2020-11-19
  Administered 2020-11-19: 60 mg via SUBCUTANEOUS

## 2020-11-19 NOTE — Progress Notes (Signed)
Per orders of Allie Bossier, injection of Prolia given in left arm, by Loreen Freud. Patient tolerated injection well.

## 2020-12-02 ENCOUNTER — Other Ambulatory Visit: Payer: Self-pay

## 2020-12-02 ENCOUNTER — Encounter: Payer: Self-pay | Admitting: Pulmonary Disease

## 2020-12-02 ENCOUNTER — Ambulatory Visit (INDEPENDENT_AMBULATORY_CARE_PROVIDER_SITE_OTHER): Payer: Medicare Other | Admitting: Pulmonary Disease

## 2020-12-02 VITALS — BP 128/74 | HR 93 | Temp 97.1°F | Ht 68.0 in | Wt 118.8 lb

## 2020-12-02 DIAGNOSIS — K219 Gastro-esophageal reflux disease without esophagitis: Secondary | ICD-10-CM | POA: Diagnosis not present

## 2020-12-02 DIAGNOSIS — M059 Rheumatoid arthritis with rheumatoid factor, unspecified: Secondary | ICD-10-CM

## 2020-12-02 DIAGNOSIS — E538 Deficiency of other specified B group vitamins: Secondary | ICD-10-CM

## 2020-12-02 DIAGNOSIS — J479 Bronchiectasis, uncomplicated: Secondary | ICD-10-CM

## 2020-12-02 DIAGNOSIS — R053 Chronic cough: Secondary | ICD-10-CM

## 2020-12-02 DIAGNOSIS — J8410 Pulmonary fibrosis, unspecified: Secondary | ICD-10-CM | POA: Diagnosis not present

## 2020-12-02 MED ORDER — AZITHROMYCIN 250 MG PO TABS: 250.0000 mg | ORAL_TABLET | ORAL | 3 refills | Status: DC

## 2020-12-02 MED ORDER — GABAPENTIN 100 MG PO CAPS: 100.0000 mg | ORAL_CAPSULE | Freq: Every day | ORAL | 3 refills | Status: DC

## 2020-12-02 MED ORDER — MONTELUKAST SODIUM 10 MG PO TABS: 10.0000 mg | ORAL_TABLET | Freq: Every day | ORAL | 3 refills | Status: DC

## 2020-12-02 NOTE — Progress Notes (Signed)
Subjective:    Patient ID: Crystal Gregory, female    DOB: [birth year], 87 y.o.   MRN: [id number] PROBLEMS: History of prior ARDS 1999 Prior long term nitrofurantoin therapy Pulmonary fibrosis Bronchiectasis Mild COPD  PT PROFILE: 87 y.o.with remote minimal smoking history (15 PY, quit 1982) initially evaluated by Dr Stevenson Clinch 03/2016 and subsequently by Dr Chase Caller for pulmonary fibrosis. Pt has prior history of severe ARDS (1999) and has been on chronic nitrofurantoin for recurrent UTIs. She has been tried on 2 maintenance inhalers without discernible benefit.  DATA: CT chest 03/22/16:Generalized pulmonary hyperinflation but without bullous emphysema. Widespread bronchiectasis with bronchial wall thickening consistent with inflammatory bronchitis. Areas of pulmonary fibrosis, most pronounced in the lower lobes, where the bronchiectasis is more extensive, including areas of pulmonary lung destruction HRCT chest 06/11/16:Diffuse cylindrical and varicoid bronchiectasis throughout both lungs, most severe at the lung bases. Basilar predominant fibrotic interstitial lung disease with patchy honeycombing, with mild progression in the short interval since 03/22/2016 Spirometry 06/18/16: Mild obstruction, FEV1 1.42 liters (63%), FVC 2.43 (80%) Bronchoscopy 07/05/16:Normal airway exam. BAL negative for AFB Echocardiogram 10/07/15:LVEF 60%, Grade I DD, mild MR, RVSP estimate 35 mmHg HRCT 11/04/16:again compatible with interstitial lung disease, and considered diagnostic of usual interstitial pneumonia (UIP) from an imaging standpoint. There has been no significant progression of disease compared to the recent prior examination PFT 02/10/17: no obstruction, normal TLC, moderate reduction in DLCO 6MWT 03/11/17:345 m. No desaturation PFTs 11/22/17:No obstruction. Lung volumes normal. DLCO moderately reduced (55% predicted). [place] since 02/10/17 PFTs 11/23/18 : FVC: 2.67 L (95 %pred), FEV1: 2.35 L  (112 %pred), FEV1/FVC: 88%, TLC: 4.98 L (88 %pred), DLCO 42 %pred 2D echo 11/16/2019:Left ventricular ejection fraction, by estimation, is 60 to 65%. Left ventricular diastolic parameters were normal.Right ventricular systolic function is normal. The right ventricular size is normal. Mildly elevated pulmonary artery systolic pressure. The estimated right ventricular systolic pressure is 20.2 mmHg. Left atrial size was mildly dilated.Tricuspid valve regurgitation is mild to moderate CT chest high res 02/15/2020:The appearance of the lungs is compatible with interstitial lung disease, with a spectrum of findings considered diagnostic of usual interstitial pneumonia (UIP) per current ATS guidelines. Minimal progression compared to the prior examination. Barium swallow study 03/05/2020: Tertiary contractions of the esophagus mild spasm, none episode of tracheal aspiration noted without cough reflex, mild GERD. Overnight pulse oximetry 08/18/2020: No evidence of significant nocturnal desaturation. PFTs 08/31/2020: FEV1 2.16 L or 102% predicted, FVC 2.30 L or 80% predicted, FEV1 FVC 94% predicted, no bronchodilator response.  TLC 4.46 L or 78% predicted, DLCO 37% predicted.   INTERVAL: Lastencounter 08/06/2020, at that time she was doing well with cough fairly well controlled.  Referred to hematology for anemia, macrocytic.  She has seen Dr. Janese Banks.  She has B12 deficiency versus MDS.  HPI Shakya follows today for issues with pulmonary fibrosis and bronchiectasis.  Has a remote minimal smoking history.  Complex origins of pulmonary fibrosis possibilities include episode of severe ARDS 1999, chronic nitrofurantoin use for recurrent UTIs and rheumatoid arthritis.  She has not really had much progression of her disease since 2017.  Negative bronchoscopy then.  She has issues with esophageal dysmotility (tertiary contractions and rare episodes of aspiration) she is maintained on PPI to prevent further lung  injury.  She has B12 deficiency and macrocytosis and is on B12 supplementation.  She is followed for hematology.  There is still a possibility for MDS.  She has been on Imuran for a number of years  for control of rheumatoid arthritis.  She has chronic cough due to her fibrosis and bronchiectasis.  She is relatively well controlled on her current regimen of Singulair, antireflux measures, low-dose gabapentin at bedtime and 3 times a week low-dose Azithromycin.  She notes no improvement on inhalers.  Her only concern today is that she would like to be referred to a rheumatologist with [facility].  She does not mind driving to [place].  She was seeing a rheumatologist at [facility] however, this rheumatologist left and she is not satisfied with the new physician.  She is concerned about her chronic Imuran use which is understandable.  She also has issues with osteoporosis and is on Prolia.  Review of Systems A 10 point review of systems was performed and it is as noted above otherwise negative.  Patient Active Problem List   Diagnosis Date Noted   B12 deficiency 09/02/2020   Anemia 08/06/2020   Lower extremity edema 03/06/2018   TMJ (temporomandibular joint syndrome) 03/02/2018   RA (rheumatoid arthritis) ([place]) 07/13/2017   Polyarthralgia 04/25/2017   ILD (interstitial lung disease) ([place]) 06/18/2016   IPF (idiopathic pulmonary fibrosis) ([place]) 03/23/2016   Bronchiectasis without complication ([place]) 93/81/0175   Allergic rhinitis 03/22/2016   Anal fissure 08/18/2015   Insomnia 08/18/2015   Osteoporosis 02/13/2015   Stress due to illness of family member 09/16/2014   Hiatal hernia 10/13/2012   GERD (gastroesophageal reflux disease)    Allergy    No Known Allergies  Current Meds  Medication Sig   azaTHIOprine (IMURAN) 50 MG tablet Take 75 mg every morning and 50 mg every evening.   [START ON 12/03/2020] azithromycin (ZITHROMAX) 250 MG tablet Take 1 tablet (250 mg  total) by mouth 3 (three) times a week.   denosumab (PROLIA) 60 MG/ML SOLN injection Inject 60 mg into the skin every 6 (six) months. Administer in upper arm, thigh, or abdomen   gabapentin (NEURONTIN) 100 MG capsule Take 1 capsule (100 mg total) by mouth at bedtime.   montelukast (SINGULAIR) 10 MG tablet Take 1 tablet (10 mg total) by mouth daily.   nitroGLYCERIN (NITROSTAT) 0.4 MG SL tablet Place 1 tablet (0.4 mg total) under the tongue every 5 (five) minutes as needed for chest pain. Maximum 3 doses.   pantoprazole (PROTONIX) 20 MG tablet Take 1 tablet (20 mg total) by mouth 2 (two) times daily. For heartburn.   predniSONE (DELTASONE) 1 MG tablet Take 2 mg by mouth daily with breakfast.   propranolol (INDERAL) 10 MG tablet TAKE 1 TABLET TWICE A DAY FOR TREMOR   traZODone (DESYREL) 50 MG tablet TAKE 1 TABLET AT BEDTIME FOR SLEEP   vitamin B-12 (CYANOCOBALAMIN) 500 MCG tablet Take 500 mcg by mouth daily.   [DISCONTINUED] benzonatate (TESSALON) 200 MG capsule Take 1 capsule (200 mg total) by mouth in the morning and at bedtime.   Immunization History  Administered Date(s) Administered   Fluad Quad(high Dose 65+) 07/24/2019   Influenza Split 06/04/2012   Influenza, High Dose Seasonal PF 09/10/2016   Influenza,inj,Quad PF,6+ Mos 07/04/2013, 08/18/2015, 07/13/2017, 06/19/2018   Influenza-Unspecified 07/15/2020   Moderna SARS-COV2 Booster Vaccination 05/19/2020   Moderna Sars-Covid-2 Vaccination 10/22/2019, 11/19/2019   Pneumococcal Conjugate-13 10/06/2011   Pneumococcal Polysaccharide-23 06/19/2018   Zoster 01/14/2007        Objective:   Physical Exam BP 128/74 (BP Location: Left Arm, Cuff Size: Normal)    Pulse 93    Temp (!) 97.1 F (36.2 C) (Temporal)    Ht 5'  8" (1.727 m)    Wt 118 lb 12.8 oz (53.9 kg)    SpO2 98%    BMI 18.06 kg/m  GENERAL: Thin well-developed woman in no acute distress.  Fully ambulatory.    Chronically ill-appearing but spry. HEAD:  Normocephalic, atraumatic.  EYES: Pupils equal, round, reactive to light.  No scleral icterus.  MOUTH: Nose/mouth/throat not examined due to masking requirements for COVID 19. NECK: Supple. No thyromegaly. Trachea midline. No JVD.  No adenopathy. PULMONARY: Good air entry bilaterally.  Coarse breath sounds at the bases, faint dry crackles. CARDIOVASCULAR: S1 and S2. Regular rate and rhythm.  No rubs, murmurs or gallops heard.  ABDOMEN: Nondistended,  MUSCULOSKELETAL: No joint deformity, no clubbing, no edema.  NEUROLOGIC: No overt focal deficit, no gait disturbance, speech is fluent. SKIN: Intact,warm,dry. PSYCH: Mood and behavior are normal.       Assessment & Plan:     ICD-10-CM   1. Pulmonary fibrosis, likely rheumatoid lung.  History of ARDS.  History of nitrofurantoin use.  Radiographically and physiologically stable  J84.10    Complex past history of possible etiologies No significant progression  2. Bronchiectasis without complication (HCC)  V42.5    Continue azithromycin 3 times a week  3. Seropositive rheumatoid arthritis ([place])  M05.9 Ambulatory referral to Rheumatology   Referral to Dr. Estanislado Pandy Patient's request  4. Chronic cough  R05.3    Not worsened Continue current measures of control  5. Vitamin B 12 deficiency   E53.8    B12 deficiency versus MDS On B12 supplementation Followed by hematology  6. Gastroesophageal reflux disease, unspecified whether esophagitis present  K21.9    Mostly due to esophageal dysmotility Continue PPI   Orders Placed This Encounter  Procedures   Ambulatory referral to Rheumatology    Referral Priority:   Routine    Referral Type:   Consultation    Referral Reason:   Specialty Services Required    Requested Specialty:   Rheumatology    Number of Visits Requested:   1   Meds ordered this encounter  Medications   azithromycin (ZITHROMAX) 250 MG tablet    Sig: Take 1 tablet (250 mg total) by mouth 3 (three) times a week.     Dispense:  36 each    Refill:  3   montelukast (SINGULAIR) 10 MG tablet    Sig: Take 1 tablet (10 mg total) by mouth daily.    Dispense:  90 tablet    Refill:  3   gabapentin (NEURONTIN) 100 MG capsule    Sig: Take 1 capsule (100 mg total) by mouth at bedtime.    Dispense:  90 capsule    Refill:  3   Discussion:  Overall the patient is doing well.  We are mutually concerned about current dosing of Imuran and ongoing use of Imuran.  She has rheumatoid arthritis and is currently not satisfied with her current rheumatologist.  She would like to switch to a rheumatologist within [facility] system.  Referral has been made to. Dr. Bo Merino.  We will see the patient in follow-up in 3 months time she is to contact us prior to that time should any new difficulties arise.  Renold Don, MD [facility]   *This note was dictated using voice recognition software/Dragon.  Despite best efforts to proofread, errors can occur which can change the meaning.  Any change was purely unintentional.

## 2020-12-02 NOTE — Patient Instructions (Signed)
We are referring you to Dr. Bo Merino, rheumatologist in [place] phone number is [phone].  Her office is on [address] in [place].  Continue your medications as they are.   We will see you in follow-up in 3 months time please call sooner should any new problems arise.

## 2020-12-26 ENCOUNTER — Other Ambulatory Visit: Payer: Self-pay

## 2020-12-26 DIAGNOSIS — E538 Deficiency of other specified B group vitamins: Secondary | ICD-10-CM

## 2020-12-26 DIAGNOSIS — D638 Anemia in other chronic diseases classified elsewhere: Secondary | ICD-10-CM

## 2020-12-28 ENCOUNTER — Other Ambulatory Visit: Payer: Self-pay | Admitting: *Deleted

## 2020-12-28 DIAGNOSIS — D638 Anemia in other chronic diseases classified elsewhere: Secondary | ICD-10-CM

## 2020-12-28 DIAGNOSIS — E538 Deficiency of other specified B group vitamins: Secondary | ICD-10-CM

## 2020-12-29 DIAGNOSIS — J849 Interstitial pulmonary disease, unspecified: Secondary | ICD-10-CM | POA: Diagnosis not present

## 2020-12-29 DIAGNOSIS — M059 Rheumatoid arthritis with rheumatoid factor, unspecified: Secondary | ICD-10-CM | POA: Diagnosis not present

## 2020-12-29 DIAGNOSIS — Z79899 Other long term (current) drug therapy: Secondary | ICD-10-CM | POA: Diagnosis not present

## 2020-12-29 DIAGNOSIS — M810 Age-related osteoporosis without current pathological fracture: Secondary | ICD-10-CM | POA: Diagnosis not present

## 2020-12-30 ENCOUNTER — Inpatient Hospital Stay (HOSPITAL_BASED_OUTPATIENT_CLINIC_OR_DEPARTMENT_OTHER): Payer: Medicare Other | Admitting: Oncology

## 2020-12-30 ENCOUNTER — Inpatient Hospital Stay: Payer: Medicare Other | Attending: Oncology

## 2020-12-30 ENCOUNTER — Inpatient Hospital Stay: Payer: Medicare Other

## 2020-12-30 ENCOUNTER — Encounter: Payer: Self-pay | Admitting: Oncology

## 2020-12-30 VITALS — BP 126/60 | HR 69 | Temp 98.0°F | Resp 16 | Ht 68.0 in | Wt 117.4 lb

## 2020-12-30 DIAGNOSIS — D539 Nutritional anemia, unspecified: Secondary | ICD-10-CM | POA: Diagnosis not present

## 2020-12-30 DIAGNOSIS — Z79899 Other long term (current) drug therapy: Secondary | ICD-10-CM | POA: Diagnosis not present

## 2020-12-30 DIAGNOSIS — R5382 Chronic fatigue, unspecified: Secondary | ICD-10-CM | POA: Insufficient documentation

## 2020-12-30 DIAGNOSIS — Z8269 Family history of other diseases of the musculoskeletal system and connective tissue: Secondary | ICD-10-CM | POA: Diagnosis not present

## 2020-12-30 DIAGNOSIS — K219 Gastro-esophageal reflux disease without esophagitis: Secondary | ICD-10-CM | POA: Diagnosis not present

## 2020-12-30 DIAGNOSIS — E538 Deficiency of other specified B group vitamins: Secondary | ICD-10-CM | POA: Diagnosis not present

## 2020-12-30 DIAGNOSIS — M069 Rheumatoid arthritis, unspecified: Secondary | ICD-10-CM | POA: Insufficient documentation

## 2020-12-30 DIAGNOSIS — R0602 Shortness of breath: Secondary | ICD-10-CM | POA: Insufficient documentation

## 2020-12-30 DIAGNOSIS — Z8371 Family history of colonic polyps: Secondary | ICD-10-CM | POA: Diagnosis not present

## 2020-12-30 DIAGNOSIS — Z7952 Long term (current) use of systemic steroids: Secondary | ICD-10-CM | POA: Insufficient documentation

## 2020-12-30 DIAGNOSIS — D638 Anemia in other chronic diseases classified elsewhere: Secondary | ICD-10-CM

## 2020-12-30 DIAGNOSIS — Z803 Family history of malignant neoplasm of breast: Secondary | ICD-10-CM | POA: Diagnosis not present

## 2020-12-30 DIAGNOSIS — Z87891 Personal history of nicotine dependence: Secondary | ICD-10-CM | POA: Insufficient documentation

## 2020-12-30 LAB — CBC WITH DIFFERENTIAL/PLATELET
Abs Immature Granulocytes: 0.03 10*3/uL (ref 0.00–0.07)
Basophils Absolute: 0 10*3/uL (ref 0.0–0.1)
Basophils Relative: 1 %
Eosinophils Absolute: 0 10*3/uL (ref 0.0–0.5)
Eosinophils Relative: 1 %
HCT: 31.3 % — ABNORMAL LOW (ref 36.0–46.0)
Hemoglobin: 10.5 g/dL — ABNORMAL LOW (ref 12.0–15.0)
Immature Granulocytes: 1 %
Lymphocytes Relative: 26 %
Lymphs Abs: 1.7 10*3/uL (ref 0.7–4.0)
MCH: 38 pg — ABNORMAL HIGH (ref 26.0–34.0)
MCHC: 33.5 g/dL (ref 30.0–36.0)
MCV: 113.4 fL — ABNORMAL HIGH (ref 80.0–100.0)
Monocytes Absolute: 0.6 10*3/uL (ref 0.1–1.0)
Monocytes Relative: 10 %
Neutro Abs: 3.9 10*3/uL (ref 1.7–7.7)
Neutrophils Relative %: 61 %
Platelets: 321 10*3/uL (ref 150–400)
RBC: 2.76 MIL/uL — ABNORMAL LOW (ref 3.87–5.11)
RDW: 15.7 % — ABNORMAL HIGH (ref 11.5–15.5)
WBC: 6.2 10*3/uL (ref 4.0–10.5)
nRBC: 0 % (ref 0.0–0.2)

## 2020-12-30 LAB — COMPREHENSIVE METABOLIC PANEL
ALT: 9 U/L (ref 0–44)
AST: 19 U/L (ref 15–41)
Albumin: 3.9 g/dL (ref 3.5–5.0)
Alkaline Phosphatase: 39 U/L (ref 38–126)
Anion gap: 12 (ref 5–15)
BUN: 16 mg/dL (ref 8–23)
CO2: 25 mmol/L (ref 22–32)
Calcium: 8.8 mg/dL — ABNORMAL LOW (ref 8.9–10.3)
Chloride: 101 mmol/L (ref 98–111)
Creatinine, Ser: 0.7 mg/dL (ref 0.44–1.00)
GFR, Estimated: >60 mL/min (ref >60)
Glucose, Bld: 107 mg/dL — ABNORMAL HIGH (ref 70–99)
Potassium: 4.1 mmol/L (ref 3.5–5.1)
Sodium: 138 mmol/L (ref 135–145)
Total Bilirubin: 1 mg/dL (ref 0.3–1.2)
Total Protein: 7.3 g/dL (ref 6.5–8.1)

## 2020-12-30 LAB — IRON AND TIBC
Iron: 80 ug/dL (ref 28–170)
Saturation Ratios: 29 % (ref 10.4–31.8)
TIBC: 279 ug/dL (ref 250–450)
UIBC: 199 ug/dL

## 2020-12-30 LAB — VITAMIN B12: Vitamin B-12: 656 pg/mL (ref 180–914)

## 2020-12-30 LAB — FERRITIN: Ferritin: 38 ng/mL (ref 11–307)

## 2020-12-30 NOTE — Progress Notes (Signed)
Pt wants to speak to rao about her being on protonix and after 3 years or more it says that it can cause low b12 level. No other concerns.

## 2020-12-31 NOTE — Progress Notes (Signed)
Hematology/Oncology Consult note [facility]  [phone] Fax:([phone]  Patient Care Team: Pleas Koch, NP as PCP - General (Internal Medicine) Minna Merritts, MD as PCP - Cardiology (Cardiology) Minna Merritts, MD as Consulting Physician (Cardiology) Marlowe Sax, MD as Consulting Physician (Rheumatology)   Name of the patient: Crystal Gregory  [id number]  01/22/1932   Date of visit: 12/31/20  Diagnosis- macrocytic anemia likely anemia of chronic disease and component of B12 deficiency versus possible MDS  Chief complaint/ Reason for visit-routine follow-up of anemia  Heme/Onc history: Patient is a 87 year old female referred for macrocytic anemia. She has a prior history of rheumatoid arthritis as well as interstitial lung disease from rheumatoid arthritis as well. She is not on home oxygen. She has been referred for anemia. Dating back at her CBC is her hemoglobin was always between 10-11 even back in 2018 and 2019. Her most recent CBC from 08/06/2020 showed white count of 4.7, H&H of 9.5/28.8 with an MCV of 114 and platelet count of 276. She has developed progressive macrocytosis since 2019. B12 levels were mildly low at 277. Patient reports chronic fatigue and exertional shortness of breath.  She is on Imuran for rheumatoid arthritis  Results of blood work from 08/11/2020 were as follows: CBC showed white count of 4.8, H&H of 9.9/29.6 with an MCV of 114 and a platelet count of 300. B12 level was low at 277. Folate and TSH was normal. EPO level was elevated at 100. Haptoglobin was normal. Myeloma panel showed polyclonal increase but no monoclonal protein. Reticulocyte count was inappropriately low for the degree of anemia at 2.2%. CMP showed normal kidney and liver functions.  Interval history-patient has been at her baseline state of health over the last 3 months.  No recent hospitalizations.  Recently  seen by pulmonary as well.Appetite and weight have remained stable.  ECOG PS- 1 Pain scale- 0   Review of systems- Review of Systems  Constitutional: Positive for malaise/fatigue. Negative for chills, fever and weight loss.  HENT: Negative for congestion, ear discharge and nosebleeds.   Eyes: Negative for blurred vision.  Respiratory: Negative for cough, hemoptysis, sputum production, shortness of breath and wheezing.   Cardiovascular: Negative for chest pain, palpitations, orthopnea and claudication.  Gastrointestinal: Negative for abdominal pain, blood in stool, constipation, diarrhea, heartburn, melena, nausea and vomiting.  Genitourinary: Negative for dysuria, flank pain, frequency, hematuria and urgency.  Musculoskeletal: Negative for back pain, joint pain and myalgias.  Skin: Negative for rash.  Neurological: Negative for dizziness, tingling, focal weakness, seizures, weakness and headaches.  Endo/Heme/Allergies: Does not bruise/bleed easily.  Psychiatric/Behavioral: Negative for depression and suicidal ideas. The patient does not have insomnia.       No Known Allergies   Past Medical History:  Diagnosis Date  . Allergy   . CAP (community acquired pneumonia) 08/23/2017  . Chest pain    a. 09/04/2018 MV: EF >65%. No ischemia/infarct.  . Coronary artery calcification seen on CT scan   . Diastolic dysfunction    a. 10/2016 Echo: EF 60-65%, no rwma, Gr1 DD, mild MR, nl RV fxn, mild to mod TR, PASP 55mmHg.  Marland Kitchen Dyspnea   . GERD (gastroesophageal reflux disease)   . Interstitial lung disorders ([place])   . Pulmonary fibrosis ([place])   . Septicemia ([place]) 1999   spent 4 weeks in ICU, intubated -? PNA     Past Surgical History:  Procedure Laterality Date  . ABDOMINAL HYSTERECTOMY    .  FLEXIBLE BRONCHOSCOPY N/A 07/05/2016   Procedure: FLEXIBLE BRONCHOSCOPY;  Surgeon: Stephanie Acre, MD;  Location: [facility] ORS;  Service: Cardiopulmonary;  Laterality: N/A;    Social History    Socioeconomic History  . Marital status: Married    Spouse name: Not on file  . Number of children: 2  . Years of education: Not on file  . Highest education level: Not on file  Occupational History  . Occupation: Retired   Tobacco Use  . Smoking status: Former Smoker    Packs/day: 0.50    Years: 30.00    Pack years: 15.00    Quit date: 01/16/1981    Years since quitting: 39.9  . Smokeless tobacco: Never Used  Vaping Use  . Vaping Use: Never used  Substance and Sexual Activity  . Alcohol use: Yes    Comment: 2 drinks daily   . Drug use: No  . Sexual activity: Not on file  Other Topics Concern  . Not on file  Social History Narrative   Married.     2 children.  Lives with her husband in [place].   Social Determinants of Health   Financial Resource Strain: Not on file  Food Insecurity: Not on file  Transportation Needs: Not on file  Physical Activity: Not on file  Stress: Not on file  Social Connections: Not on file  Intimate Partner Violence: Not on file    Family History  Problem Relation Age of Onset  . Parkinson's disease Mother   . Colon polyps Mother 52          . Breast cancer Sister      Current Outpatient Medications:  .  azaTHIOprine (IMURAN) 50 MG tablet, Take 75 mg every morning and 50 mg every evening., Disp: 75 tablet, Rfl: 0 .  azithromycin (ZITHROMAX) 250 MG tablet, Take 1 tablet (250 mg total) by mouth 3 (three) times a week., Disp: 36 each, Rfl: 3 .  denosumab (PROLIA) 60 MG/ML SOLN injection, Inject 60 mg into the skin every 6 (six) months. Administer in upper arm, thigh, or abdomen, Disp: , Rfl:  .  gabapentin (NEURONTIN) 100 MG capsule, Take 1 capsule (100 mg total) by mouth at bedtime., Disp: 90 capsule, Rfl: 3 .  montelukast (SINGULAIR) 10 MG tablet, Take 1 tablet (10 mg total) by mouth daily., Disp: 90 tablet, Rfl: 3 .  Multiple Minerals-Vitamins (CALCIUM & VIT D3 BONE HEALTH PO), Take 1 Dose by mouth daily., Disp: , Rfl:  .   pantoprazole (PROTONIX) 20 MG tablet, Take 1 tablet (20 mg total) by mouth 2 (two) times daily. For heartburn., Disp: 180 tablet, Rfl: 3 .  predniSONE (DELTASONE) 1 MG tablet, Take 2 mg by mouth daily with breakfast., Disp: , Rfl:  .  propranolol (INDERAL) 10 MG tablet, TAKE 1 TABLET TWICE A DAY FOR TREMOR, Disp: 180 tablet, Rfl: 3 .  traZODone (DESYREL) 50 MG tablet, TAKE 1 TABLET AT BEDTIME FOR SLEEP, Disp: 90 tablet, Rfl: 3 .  vitamin B-12 (CYANOCOBALAMIN) 500 MCG tablet, Take 500 mcg by mouth daily., Disp: , Rfl:  No current facility-administered medications for this visit.  Facility-Administered Medications Ordered in Other Visits:  .  cyanocobalamin ((VITAMIN B-12)) injection 1,000 mcg, 1,000 mcg, Intramuscular, Q30 days, Creig Hines, MD, 1,000 mcg at 10/02/20 1022  Physical exam:  Vitals:   12/30/20 1014  BP: 126/60  Pulse: 69  Resp: 16  Temp: 98 F (36.7 C)  TempSrc: Oral  Weight: 117 lb 6.4 oz (53.3 kg)  Height: '5\' 8"'$  (1.727 m)   Physical Exam Constitutional:      General: She is not in acute distress. Cardiovascular:     Rate and Rhythm: Normal rate.  Pulmonary:     Effort: Pulmonary effort is normal.     Breath sounds: Normal breath sounds.  Skin:    General: Skin is warm and dry.  Neurological:     Mental Status: She is alert and oriented to person, place, and time.      CMP Latest Ref Rng & Units 12/30/2020  Glucose 70 - 99 mg/dL 107(H)  BUN 8 - 23 mg/dL 16  Creatinine 0.44 - 1.00 mg/dL 0.70  Sodium 135 - 145 mmol/L 138  Potassium 3.5 - 5.1 mmol/L 4.1  Chloride 98 - 111 mmol/L 101  CO2 22 - 32 mmol/L 25  Calcium 8.9 - 10.3 mg/dL 8.8(L)  Total Protein 6.5 - 8.1 g/dL 7.3  Total Bilirubin 0.3 - 1.2 mg/dL 1.0  Alkaline Phos 38 - 126 U/L 39  AST 15 - 41 U/L 19  ALT 0 - 44 U/L 9   CBC Latest Ref Rng & Units 12/30/2020  WBC 4.0 - 10.5 K/uL 6.2  Hemoglobin 12.0 - 15.0 g/dL 10.5(L)  Hematocrit 36.0 - 46.0 % 31.3(L)  Platelets 150 - 400 K/uL 321       Assessment and plan- Patient is a 87 y.o. female with macrocytic anemia here for routine follow-up  Patient has had chronic stable macrocytic anemia.  She has developed macrocytosis since November 2019.  Even prior to that her hemoglobin has been mostly between 10-11 since 2018.  Results of anemia work-up were otherwise unremarkable except for a mildly low B12 level of 277 done 4 months ago.  She is now on oral B12 and her numbers today are normal at 656.  I suspect her anemia secondary to chronic disease but given the macrocytosis an element of MDS cannot be ruled out.  Regardless her counts are stable and besides anemia she does not have any other cytopenias.  This can be monitored conservatively without the need for a bone marrow biopsy.  I will see her back in 6 months with CBC ferritin and iron studies and B12   Visit Diagnosis 1. B12 deficiency   2. Macrocytic anemia      Dr. Randa Evens, MD, MPH [facility] [phone] 12/31/2020 8:07 AM

## 2021-01-01 ENCOUNTER — Other Ambulatory Visit: Payer: Self-pay | Admitting: Primary Care

## 2021-01-01 DIAGNOSIS — G250 Essential tremor: Secondary | ICD-10-CM

## 2021-01-19 NOTE — Progress Notes (Signed)
Office Visit Note  Patient: Crystal Gregory             Date of Birth: 01-11-1932           MRN: [id number]             PCP: Pleas Koch, NP Referring: Tyler Pita, MD Visit Date: 02/02/2021 Occupation: @GUAROCC @  Subjective:  Rheumatoid arthritis and interstitial lung disease   History of Present Illness: Crystal Gregory is a 87 y.o. female history of rheumatoid arthritis and interstitial lung disease.  On reviewing chart I found that she was evaluated by Dr. Meda Coffee in July 2018 when she presented with joint pain and said elevated sedimentation rate.  She already had the diagnosis of ILD and pulmonary fibrosis.  ILD was diagnosed in 2017 after bronchoscopy.  Initially it was felt due to severe ARDS from long-term use of nitrofurantoin in 1999.  She was under care of Dr. Alva Garnet.  The CT scan in February 2018 was consistent with IPF and pulmonary fibrosis.  Her rheumatologist found positive rheumatoid factor and elevated sedimentation rate.  She was diagnosed with rheumatoid arthritis.  She was initially treated with prednisone.She was initially started on Plaquenil but it was discontinued due to hair loss and photosensitivity.  She was on leflunomide for a short time.  In May 2019 she was a started on Imuran 150 mg p.o. daily and the dose was later decreased to 125 mg p.o. daily.  She had been under care of Dr. Posey Pronto since December 2021.  She according to the chart review she was on Imuran 125 mg p.o. daily along with prednisone 2 mg p.o. daily.  She is followed by Dr. Patsey Berthold for UIP.  She also has history of osteoporosis for which she has been on Prolia for the last several years by her PCP.  She has history of TMJ pain for which she uses Voltaren gel.  According to the notes her rheumatoid arthritis has been stable.  She continues to have some discomfort in her right second MCP joint.  Activities of Daily Living:  Patient reports morning stiffness for all day. Patient Reports  nocturnal pain.  Difficulty dressing/grooming: Denies Difficulty climbing stairs: Denies Difficulty getting out of chair: Denies Difficulty using hands for taps, buttons, cutlery, and/or writing: Reports  Review of Systems  Constitutional: Positive for fatigue. Negative for night sweats, weight gain and weight loss.  HENT: Negative for mouth sores, trouble swallowing, trouble swallowing, mouth dryness and nose dryness.   Eyes: Negative for pain, redness, itching, visual disturbance and dryness.  Respiratory: Positive for shortness of breath and difficulty breathing. Negative for cough.   Cardiovascular: Negative for chest pain, palpitations, hypertension, irregular heartbeat and swelling in legs/feet.  Gastrointestinal: Negative for blood in stool, constipation and diarrhea.  Endocrine: Negative for increased urination.  Genitourinary: Negative for difficulty urinating and vaginal dryness.  Musculoskeletal: Positive for arthralgias, joint pain and morning stiffness. Negative for joint swelling, myalgias, muscle weakness, muscle tenderness and myalgias.  Skin: Negative for color change, rash, hair loss, redness, skin tightness, ulcers and sensitivity to sunlight.  Allergic/Immunologic: Negative for susceptible to infections.  Neurological: Negative for dizziness, numbness, headaches, memory loss, night sweats and weakness.  Hematological: Negative for bruising/bleeding tendency and swollen glands.  Psychiatric/Behavioral: Negative for depressed mood, confusion and sleep disturbance. The patient is not nervous/anxious.     PMFS History:  Patient Active Problem List   Diagnosis Date Noted  . B12 deficiency 09/02/2020  . Anemia 08/06/2020  .  Lower extremity edema 03/06/2018  . TMJ (temporomandibular joint syndrome) 03/02/2018  . RA (rheumatoid arthritis) ([place]) 07/13/2017  . Polyarthralgia 04/25/2017  . ILD (interstitial lung disease) ([place]) 06/18/2016  . IPF (idiopathic pulmonary  fibrosis) ([place]) 03/23/2016  . Bronchiectasis without complication ([place]) 90/30/0923  . Allergic rhinitis 03/22/2016  . Anal fissure 08/18/2015  . Insomnia 08/18/2015  . Osteoporosis 02/13/2015  . Stress due to illness of family member 09/16/2014  . Hiatal hernia 10/13/2012  . GERD (gastroesophageal reflux disease)   . Allergy     Past Medical History:  Diagnosis Date  . Allergy   . CAP (community acquired pneumonia) 08/23/2017  . Chest pain    a. 09/04/2018 MV: EF >65%. No ischemia/infarct.  . Coronary artery calcification seen on CT scan   . Diastolic dysfunction    a. 10/2016 Echo: EF 60-65%, no rwma, Gr1 DD, mild MR, nl RV fxn, mild to mod TR, PASP 40mmHg.  Marland Kitchen Dyspnea   . GERD (gastroesophageal reflux disease)   . Interstitial lung disorders ([place])   . Pulmonary fibrosis ([place])   . Septicemia ([place]) 1999   spent 4 weeks in ICU, intubated -? PNA    Family History  Problem Relation Age of Onset  . Parkinson's disease Mother   . Colon polyps Mother 62          . Ulcers Father   . Rheumatic fever Father   . Macular degeneration Sister   . Healthy Son   . Healthy Daughter    Past Surgical History:  Procedure Laterality Date  . ABDOMINAL HYSTERECTOMY    . FLEXIBLE BRONCHOSCOPY N/A 07/05/2016   Procedure: FLEXIBLE BRONCHOSCOPY;  Surgeon: Vilinda Boehringer, MD;  Location: [facility] ORS;  Service: Cardiopulmonary;  Laterality: N/A;   Social History   Social History Narrative   Married.     2 children.  Lives with her husband in [place].   Immunization History  Administered Date(s) Administered  . Fluad Quad(high Dose 65+) 07/24/2019  . Influenza Split 06/04/2012  . Influenza, High Dose Seasonal PF 09/10/2016  . Influenza,inj,Quad PF,6+ Mos 07/04/2013, 08/18/2015, 07/13/2017, 06/19/2018  . Influenza-Unspecified 07/15/2020  . Moderna SARS-COV2 Booster Vaccination 05/19/2020  . Moderna Sars-Covid-2 Vaccination 10/22/2019, 11/19/2019  . Pneumococcal Conjugate-13 10/06/2011  .  Pneumococcal Polysaccharide-23 06/19/2018  . Zoster 01/14/2007     Objective: Vital Signs: BP 117/67 (BP Location: Right Arm, Patient Position: Sitting, Cuff Size: Normal)   Pulse 79   Ht 5\' 7"  (1.702 m)   Wt 117 lb 6.4 oz (53.3 kg)   BMI 18.39 kg/m    Physical Exam Vitals and nursing note reviewed.  Constitutional:      Appearance: She is well-developed.  HENT:     Head: Normocephalic and atraumatic.  Eyes:     Conjunctiva/sclera: Conjunctivae normal.  Cardiovascular:     Rate and Rhythm: Normal rate and regular rhythm.     Heart sounds: Normal heart sounds.  Pulmonary:     Effort: Pulmonary effort is normal.     Breath sounds: Rales present.  Abdominal:     General: Bowel sounds are normal.     Palpations: Abdomen is soft.  Musculoskeletal:     Cervical back: Normal range of motion.  Lymphadenopathy:     Cervical: No cervical adenopathy.  Skin:    General: Skin is warm and dry.     Capillary Refill: Capillary refill takes less than 2 seconds.  Neurological:     Mental Status: She is alert and oriented to  person, place, and time.  Psychiatric:        Behavior: Behavior normal.      Musculoskeletal Exam: C-spine was in good range of motion.  She has mild tenderness over left TMJ.  Shoulder joints, elbow joints, wrist joints in good range of motion.  She has minor deviation.  She has mild synovitis over right second MCP joint.  Hip joints, knee joints, MTPs were in good range of motion.  She has bilateral hammertoes.  No synovitis was noted.  CDAI Exam: CDAI Score: 2.4  Patient Global: 2 mm; Provider Global: 2 mm Swollen: 1 ; Tender: 1  Joint Exam 02/02/2021      Right  Left  MCP 2  Swollen Tender        Investigation: No additional findings.  Imaging: No results found.  Recent Labs: Lab Results  Component Value Date   WBC 6.2 12/30/2020   HGB 10.5 (L) 12/30/2020   PLT 321 12/30/2020   NA 138 12/30/2020   K 4.1 12/30/2020   CL 101 12/30/2020   CO2  25 12/30/2020   GLUCOSE 107 (H) 12/30/2020   BUN 16 12/30/2020   CREATININE 0.70 12/30/2020   BILITOT 1.0 12/30/2020   ALKPHOS 39 12/30/2020   AST 19 12/30/2020   ALT 9 12/30/2020   PROT 7.3 12/30/2020   ALBUMIN 3.9 12/30/2020   CALCIUM 8.8 (L) 12/30/2020   GFRAA >60 08/25/2018    Speciality Comments: Plaquenil and leflunomide-inadequate response Imuran 125 mg p.o. daily started May 2019.  We discussed switching to CellCept which she declined.  Procedures:  No procedures performed Allergies: Patient has no known allergies.   Assessment / Plan:     Visit Diagnoses: Seropositive rheumatoid arthritis (HCC)-patient was diagnosed with rheumatoid arthritis in 2018 by Dr. Meda Coffee.  She was treated with Plaquenil and leflunomide which were discontinued due to inadequate response.  She had been on Imuran since 2019.  She has mild synovitis over right second MCP joint which has been unchanged for many years per patient.  She is also some ulnar deviation.  She has been tolerating current dose well.  High risk medication use - Imuran 50 mg q AM with 75 mg q PM and prednisone 2 mg by mouth daily -she has been on low-dose prednisone for many years.  Most recent labs CBC with differential and CMP with GFR from March 2022 were stable.  She has chronic anemia.  We will continue to monitor labs every 3 months.  Plan: CBC with Differential/Platelet, COMPLETE METABOLIC PANEL WITH GFR  Pain in both hands -she does not have much discomfort in her hands.  She had some synovitis of her right second MCP joint which she states has been there for many years.  Plan: XR Hand 2 View Right, XR Hand 2 View Left.  X-ray showed juxta-articular osteopenia, right hand MCP narrowing and osteoarthritic changes.  No erosive changes were noted.  Pain in both feet -she had no synovitis on examination.  Plan: XR Foot 2 Views Right, XR Foot 2 Views Left.  X-ray showed started osteopenia and osteoarthritic changes.  No erosive changes  were noted.  IPF (idiopathic pulmonary fibrosis) (HCC)-she has history of ARDS from nitrofurantoin.  ILD (interstitial lung disease) ([place]) - followed by Dr. Patsey Berthold he had UIP pattern noted on high-resolution CT Feb 15, 2020.  I reviewed records and note from December 02, 2020.  According to Dr. Patsey Berthold her ILD has been stable.  Bronchiectasis without complication (HCC)-she is on  azithromycin 3 times a week.  TMJ (temporomandibular joint syndrome) - L TMJ  Age-related osteoporosis without current pathological fracture - January 22, 2020 DEXA the BMD measured at Forearm Radius 33% is 0.604 g/cm2 with a T-score.  She has been on Prolia by her PCP.of -3.1of -3.1  History of gastroesophageal reflux (GERD)-history of esophageal dysmotility.  She is on PPI.  Hiatal hernia  B12 deficiency    Orders: Orders Placed This Encounter  Procedures  . XR Hand 2 View Right  . XR Hand 2 View Left  . XR Foot 2 Views Right  . XR Foot 2 Views Left  . CBC with Differential/Platelet  . COMPLETE METABOLIC PANEL WITH GFR   No orders of the defined types were placed in this encounter.   Face-to-face time spent with patient was 60 minutes. Greater than 50% of time was spent in counseling and coordination of care.  Follow-Up Instructions: Return for Rheumatoid arthritis,ILD.   Bo Merino, MD  Note - This record has been created using Editor, commissioning.  Chart creation errors have been sought, but may not always  have been located. Such creation errors do not reflect on  the standard of medical care.

## 2021-01-21 ENCOUNTER — Ambulatory Visit (INDEPENDENT_AMBULATORY_CARE_PROVIDER_SITE_OTHER): Payer: Medicare Other | Admitting: Primary Care

## 2021-01-21 ENCOUNTER — Other Ambulatory Visit: Payer: Self-pay

## 2021-01-21 DIAGNOSIS — J84112 Idiopathic pulmonary fibrosis: Secondary | ICD-10-CM | POA: Diagnosis not present

## 2021-01-21 DIAGNOSIS — G250 Essential tremor: Secondary | ICD-10-CM | POA: Diagnosis not present

## 2021-01-21 DIAGNOSIS — K219 Gastro-esophageal reflux disease without esophagitis: Secondary | ICD-10-CM

## 2021-01-21 DIAGNOSIS — E538 Deficiency of other specified B group vitamins: Secondary | ICD-10-CM | POA: Diagnosis not present

## 2021-01-21 DIAGNOSIS — M069 Rheumatoid arthritis, unspecified: Secondary | ICD-10-CM | POA: Diagnosis not present

## 2021-01-21 DIAGNOSIS — M810 Age-related osteoporosis without current pathological fracture: Secondary | ICD-10-CM | POA: Diagnosis not present

## 2021-01-21 DIAGNOSIS — J479 Bronchiectasis, uncomplicated: Secondary | ICD-10-CM

## 2021-01-21 DIAGNOSIS — M26609 Unspecified temporomandibular joint disorder, unspecified side: Secondary | ICD-10-CM

## 2021-01-21 DIAGNOSIS — G4700 Insomnia, unspecified: Secondary | ICD-10-CM | POA: Diagnosis not present

## 2021-01-21 DIAGNOSIS — J849 Interstitial pulmonary disease, unspecified: Secondary | ICD-10-CM

## 2021-01-21 DIAGNOSIS — M2550 Pain in unspecified joint: Secondary | ICD-10-CM

## 2021-01-21 MED ORDER — TRAZODONE HCL 50 MG PO TABS: ORAL_TABLET | ORAL | 3 refills | Status: DC

## 2021-01-21 MED ORDER — PROPRANOLOL HCL 10 MG PO TABS
10.0000 mg | ORAL_TABLET | Freq: Two times a day (BID) | ORAL | 3 refills | Status: DC
Start: 2021-01-21 — End: 2022-01-22

## 2021-01-21 MED ORDER — PANTOPRAZOLE SODIUM 20 MG PO TBEC: 20.0000 mg | DELAYED_RELEASE_TABLET | Freq: Two times a day (BID) | ORAL | 3 refills | Status: DC

## 2021-01-21 NOTE — Assessment & Plan Note (Signed)
Stable.  Continue Singulair 10 mg, Azithromycin three times weekly, gabapentin 100 mg.   Recent notes reviewed form pulmonology visit.

## 2021-01-21 NOTE — Assessment & Plan Note (Signed)
Chronic secondary to RA. Will be switching rheumatologists soon. Continue current RA treatment.

## 2021-01-21 NOTE — Assessment & Plan Note (Addendum)
Following with rheumatology, continue prednisone 2 mg and Imuran 75 mg in AM and 50 mg in PM.  Recent notes reviewed.

## 2021-01-21 NOTE — Assessment & Plan Note (Signed)
Doing well on pantoprazole 20 mg BID, continue same.

## 2021-01-21 NOTE — Patient Instructions (Addendum)
It was a pleasure to see you today!   

## 2021-01-21 NOTE — Assessment & Plan Note (Signed)
Following with hematology, continue oral B12.  Labs reviewed.

## 2021-01-21 NOTE — Assessment & Plan Note (Signed)
Compliant to Prolia injections per rheumatology, continue same.

## 2021-01-21 NOTE — Assessment & Plan Note (Signed)
Recent flare, overall improving.

## 2021-01-21 NOTE — Assessment & Plan Note (Signed)
Overall seems to be doing well on Trazodone 50 mg HS, continue same.

## 2021-01-21 NOTE — Progress Notes (Signed)
Subjective:    Patient ID: Crystal Gregory, female    DOB: 1932-08-06, 87 y.o.   MRN: [id number]  HPI  Crystal Gregory is a very pleasant 87 y.o. female with a history of IPF, ILD, GERD, RA, anemia, who presents today for follow up of chronic conditions.  1) Rheumatoid Arthritis: Currently following with Rheumatology, last visit in late March 2020, recently referred to [facility] from her [facility] provider as she was unhappy with her recent care. Managed on Imuran , prednisone 2 mg, and also Prolia for osteoporosis. She has an appointment scheduled for May 2022.   2) IPF, ILD, pulmonary fibrosis: Currently following with pulmonology, last visit in March 2022, no changes made in her regimen. Managed on Singulair 10 mg, Azithromycin three times weekly, gabapentin 100 mg HS. No longer on Benzonatate.   3) Anemia/B12 Deficiency: Following with hematology, last visit in March 2022. No changes made during this visit, she is to continue oral B12. She is compliant to her oral B12.   4) Tremor: Currently managed on propranolol 20 mg BID. Doing well on this regimen and is needing refills.   5) Insomnia: Currently managed on Trazodone 50 mg, wakes 3-4 times nightly which is mostly due to her husbands chronic health conditions. Overall doing well on this regimen.   BP Readings from Last 3 Encounters:  01/21/21 120/60  12/30/20 126/60  12/02/20 128/74      Review of Systems  Respiratory:       Dyspnea stable.  Cardiovascular: Negative for chest pain.  Musculoskeletal: Positive for arthralgias.       Chronic arthralgias to her hands  Neurological: Negative for tremors.  Psychiatric/Behavioral: Positive for sleep disturbance.       Overall doing well on Trazodone         Past Medical History:  Diagnosis Date  . Allergy   . CAP (community acquired pneumonia) 08/23/2017  . Chest pain    a. 09/04/2018 MV: EF >65%. No ischemia/infarct.  . Coronary artery calcification seen on CT  scan   . Diastolic dysfunction    a. 10/2016 Echo: EF 60-65%, no rwma, Gr1 DD, mild MR, nl RV fxn, mild to mod TR, PASP 41mmHg.  Marland Kitchen Dyspnea   . GERD (gastroesophageal reflux disease)   . Interstitial lung disorders ([place])   . Pulmonary fibrosis ([place])   . Septicemia ([place]) 1999   spent 4 weeks in ICU, intubated -? PNA    Social History   Socioeconomic History  . Marital status: Married    Spouse name: Not on file  . Number of children: 2  . Years of education: Not on file  . Highest education level: Not on file  Occupational History  . Occupation: Retired   Tobacco Use  . Smoking status: Former Smoker    Packs/day: 0.50    Years: 30.00    Pack years: 15.00    Quit date: 01/16/1981    Years since quitting: 40.0  . Smokeless tobacco: Never Used  Vaping Use  . Vaping Use: Never used  Substance and Sexual Activity  . Alcohol use: Yes    Comment: 2 drinks daily   . Drug use: No  . Sexual activity: Not on file  Other Topics Concern  . Not on file  Social History Narrative   Married.     2 children.  Lives with her husband in [place].   Social Determinants of Health   Financial Resource Strain: Not on file  Food  Insecurity: Not on file  Transportation Needs: Not on file  Physical Activity: Not on file  Stress: Not on file  Social Connections: Not on file  Intimate Partner Violence: Not on file    Past Surgical History:  Procedure Laterality Date  . ABDOMINAL HYSTERECTOMY    . FLEXIBLE BRONCHOSCOPY N/A 07/05/2016   Procedure: FLEXIBLE BRONCHOSCOPY;  Surgeon: Vilinda Boehringer, MD;  Location: [facility] ORS;  Service: Cardiopulmonary;  Laterality: N/A;    Family History  Problem Relation Age of Onset  . Parkinson's disease Mother   . Colon polyps Mother 68          . Breast cancer Sister     No Known Allergies  Current Outpatient Medications on File Prior to Visit  Medication Sig Dispense Refill  . azaTHIOprine (IMURAN) 50 MG tablet Take 75 mg every morning and 50 mg  every evening. 75 tablet 0  . azithromycin (ZITHROMAX) 250 MG tablet Take 1 tablet (250 mg total) by mouth 3 (three) times a week. 36 each 3  . denosumab (PROLIA) 60 MG/ML SOLN injection Inject 60 mg into the skin every 6 (six) months. Administer in upper arm, thigh, or abdomen    . gabapentin (NEURONTIN) 100 MG capsule Take 1 capsule (100 mg total) by mouth at bedtime. 90 capsule 3  . montelukast (SINGULAIR) 10 MG tablet Take 1 tablet (10 mg total) by mouth daily. 90 tablet 3  . Multiple Minerals-Vitamins (CALCIUM & VIT D3 BONE HEALTH PO) Take 1 Dose by mouth daily.    . predniSONE (DELTASONE) 1 MG tablet Take 2 mg by mouth daily with breakfast.    . propranolol (INDERAL) 10 MG tablet TAKE 1 TABLET TWICE A DAY FOR TREMOR 180 tablet 0  . traZODone (DESYREL) 50 MG tablet TAKE 1 TABLET AT BEDTIME FOR SLEEP 90 tablet 3  . vitamin B-12 (CYANOCOBALAMIN) 500 MCG tablet Take 500 mcg by mouth daily.    . pantoprazole (PROTONIX) 20 MG tablet Take 1 tablet (20 mg total) by mouth 2 (two) times daily. For heartburn. 180 tablet 3   Current Facility-Administered Medications on File Prior to Visit  Medication Dose Route Frequency Provider Last Rate Last Admin  . cyanocobalamin ((VITAMIN B-12)) injection 1,000 mcg  1,000 mcg Intramuscular Q30 days Sindy Guadeloupe, MD   1,000 mcg at 10/02/20 1022    BP 120/60   Pulse 71   Temp 98.7 F (37.1 C) (Temporal)   Ht 5\' 8"  (1.727 m)   Wt 115 lb (52.2 kg)   SpO2 99%   BMI 17.49 kg/m  Objective:   Physical Exam Cardiovascular:     Rate and Rhythm: Normal rate and regular rhythm.  Pulmonary:     Effort: Pulmonary effort is normal.     Breath sounds: Normal breath sounds.  Musculoskeletal:     Cervical back: Neck supple.  Skin:    General: Skin is warm and dry.  Neurological:     Mental Status: She is alert.  Psychiatric:        Mood and Affect: Mood normal.           Assessment & Plan:      This visit occurred during the SARS-CoV-2 public  health emergency.  Safety protocols were in place, including screening questions prior to the visit, additional usage of staff PPE, and extensive cleaning of exam room while observing appropriate contact time as indicated for disinfecting solutions.

## 2021-02-02 ENCOUNTER — Ambulatory Visit: Payer: Self-pay

## 2021-02-02 ENCOUNTER — Encounter: Payer: Self-pay | Admitting: Rheumatology

## 2021-02-02 ENCOUNTER — Other Ambulatory Visit: Payer: Self-pay

## 2021-02-02 ENCOUNTER — Ambulatory Visit (INDEPENDENT_AMBULATORY_CARE_PROVIDER_SITE_OTHER): Payer: Medicare Other | Admitting: Rheumatology

## 2021-02-02 VITALS — BP 117/67 | HR 79 | Ht 67.0 in | Wt 117.4 lb

## 2021-02-02 DIAGNOSIS — M26609 Unspecified temporomandibular joint disorder, unspecified side: Secondary | ICD-10-CM | POA: Diagnosis not present

## 2021-02-02 DIAGNOSIS — M79671 Pain in right foot: Secondary | ICD-10-CM

## 2021-02-02 DIAGNOSIS — E538 Deficiency of other specified B group vitamins: Secondary | ICD-10-CM | POA: Diagnosis not present

## 2021-02-02 DIAGNOSIS — M79642 Pain in left hand: Secondary | ICD-10-CM

## 2021-02-02 DIAGNOSIS — M79641 Pain in right hand: Secondary | ICD-10-CM | POA: Diagnosis not present

## 2021-02-02 DIAGNOSIS — M810 Age-related osteoporosis without current pathological fracture: Secondary | ICD-10-CM

## 2021-02-02 DIAGNOSIS — J84112 Idiopathic pulmonary fibrosis: Secondary | ICD-10-CM | POA: Diagnosis not present

## 2021-02-02 DIAGNOSIS — M059 Rheumatoid arthritis with rheumatoid factor, unspecified: Secondary | ICD-10-CM | POA: Diagnosis not present

## 2021-02-02 DIAGNOSIS — K449 Diaphragmatic hernia without obstruction or gangrene: Secondary | ICD-10-CM

## 2021-02-02 DIAGNOSIS — Z79899 Other long term (current) drug therapy: Secondary | ICD-10-CM | POA: Diagnosis not present

## 2021-02-02 DIAGNOSIS — Z8719 Personal history of other diseases of the digestive system: Secondary | ICD-10-CM

## 2021-02-02 DIAGNOSIS — M79672 Pain in left foot: Secondary | ICD-10-CM

## 2021-02-02 DIAGNOSIS — J849 Interstitial pulmonary disease, unspecified: Secondary | ICD-10-CM | POA: Diagnosis not present

## 2021-02-02 DIAGNOSIS — J479 Bronchiectasis, uncomplicated: Secondary | ICD-10-CM | POA: Diagnosis not present

## 2021-02-02 NOTE — Progress Notes (Signed)
Pharmacy Note  Subjective: Patient presents today to [facility] Rheumatology for follow up office visit. Patient seen by the pharmacist for counseling on azathioprine (Imuran) for rheumatoid arthritis.  Previous therapy includes: Plaquenil and leflunomide  Objective: CMP     Component Value Date/Time   NA 138 12/30/2020 0852   K 4.1 12/30/2020 0852   CL 101 12/30/2020 0852   CO2 25 12/30/2020 0852   GLUCOSE 107 (H) 12/30/2020 0852   BUN 16 12/30/2020 0852   CREATININE 0.70 12/30/2020 0852   CALCIUM 8.8 (L) 12/30/2020 0852   PROT 7.3 12/30/2020 0852   ALBUMIN 3.9 12/30/2020 0852   AST 19 12/30/2020 0852   ALT 9 12/30/2020 0852   ALKPHOS 39 12/30/2020 0852   BILITOT 1.0 12/30/2020 0852   GFRNONAA >60 12/30/2020 0852   GFRAA >60 08/25/2018 1759    CBC    Component Value Date/Time   WBC 6.2 12/30/2020 0852   RBC 2.76 (L) 12/30/2020 0852   HGB 10.5 (L) 12/30/2020 0852   HCT 31.3 (L) 12/30/2020 0852   PLT 321 12/30/2020 0852   MCV 113.4 (H) 12/30/2020 0852   MCH 38.0 (H) 12/30/2020 0852   MCHC 33.5 12/30/2020 0852   RDW 15.7 (H) 12/30/2020 0852   LYMPHSABS 1.7 12/30/2020 0852   MONOABS 0.6 12/30/2020 0852   EOSABS 0.0 12/30/2020 0852   BASOSABS 0.0 12/30/2020 0852    Baseline Immunosuppressant Therapy Labs TB GOLD - negative on 06/30/20   Hepatitis Panel - never drawn per patient  HIV negative on 08/23/2017   HIV Lab Results  Component Value Date   HIV Non Reactive 08/23/2017   Immunoglobulins Immunoglobulin Electrophoresis Latest Ref Rng & Units 08/11/2020  IgG 586 - 1,602 mg/dL 1,104  IgM 26 - 217 mg/dL 253(H)   05/04/2017  Total IgG: 1686 (H)  IgA: 133  IgM: 231 (H)  Alpha-1-globulin: 0.2  Alpha-2-globulin: 0.8  Beta globulin: 0.8  Gamma globulin: 1.6  M-spike not observed  Total globulin: 3.5  A/G ratio: 1.1  08/12/20  Total IgG: 1104  IgA: 142  IgM: 253 (H)  Alpha-1-globulin: 0.4  Alpha-2-globulin: 1.0  Beta globulin:  1.0  Gamma globulin: 1.2  M-spike not observed  Total globulin: 3.6  Alb/Glubulin ratio: 1.1  SPEP Serum Protein Electrophoresis Latest Ref Rng & Units 12/30/2020  Total Protein 6.5 - 8.1 g/dL 7.3   TPMT  No results found for: TPMT - per [facility] scan, TPMT was normal but unclear of date drawn  HRCT: 02/15/20 - HRCT - showed interstitial densities consistent with pulmonary fibrosis - stable  05/02/2017 RA factor: 40.1 (H) Anti-CCP IgG/IgA antibodies: 91 (H)  Assessment/Plan: Patient was counseled on the purpose, proper use, and adverse effects of azathioprine including risk of infection, nausea, rash, and hair loss. Also informed that medication can cause discoloration of urine, sweat and tears. Reviewed risk of cancer after long term use.  Discussed risk of myelosupression and reviewed importance of frequent lab work to monitor blood counts.  Standing orders placed.  Reviewed drug-drug interactions including contraindication with allopurinol.  Provided patient with educational materials on azathioprine and answered all questions.  Patient consented to azathioprine.  Will upload consent into the media tab.   Patient will continue azathioprine 75 mg every morning and 50 mg every evening.  Patient has enough medication at home and refill available at [place], PharmD, MPH Clinical Pharmacist (Rheumatology and Pulmonology)

## 2021-02-02 NOTE — Patient Instructions (Signed)
Azathioprine tablets What is this medicine? AZATHIOPRINE (ay za THYE oh preen) suppresses the immune system. It is used to prevent organ rejection after a transplant. It is also used to treat rheumatoid arthritis. This medicine may be used for other purposes; ask your health care provider or pharmacist if you have questions. COMMON BRAND NAME(S): Azasan, Imuran What should I tell my health care provider before I take this medicine? They need to know if you have any of these conditions:  infection  kidney disease  liver disease  an unusual or allergic reaction to azathioprine, other medicines, lactose, foods, dyes, or preservatives  pregnant or trying to get pregnant  breast feeding How should I use this medicine? Take this medicine by mouth with a full glass of water. Follow the directions on the prescription label. Take your medicine at regular intervals. Do not take your medicine more often than directed. Continue to take your medicine even if you feel better. Do not stop taking except on your doctor's advice. Talk to your pediatrician regarding the use of this medicine in children. Special care may be needed. Overdosage: If you think you have taken too much of this medicine contact a poison control center or emergency room at once. NOTE: This medicine is only for you. Do not share this medicine with others. What if I miss a dose? If you miss a dose, take it as soon as you can. If it is almost time for your next dose, take only that dose. Do not take double or extra doses. What may interact with this medicine? Do not take this medicine with any of the following medications:  febuxostat  mercaptopurine This medicine may also interact with the following medications:  allopurinol  aminosalicylates like sulfasalazine, mesalamine, balsalazide, and olsalazine  leflunomide  medicines called ACE inhibitors like benazepril, captopril, enalapril, fosinopril, quinapril, lisinopril,  ramipril, and trandolapril  mycophenolate  sulfamethoxazole; trimethoprim  vaccines  warfarin This list may not describe all possible interactions. Give your health care provider a list of all the medicines, herbs, non-prescription drugs, or dietary supplements you use. Also tell them if you smoke, drink alcohol, or use illegal drugs. Some items may interact with your medicine. What should I watch for while using this medicine? Visit your doctor or health care professional for regular checks on your progress. You will need frequent blood checks during the first few months you are receiving the medicine. If you get a cold or other infection while receiving this medicine, call your doctor or health care professional. Do not treat yourself. The medicine may increase your risk of getting an infection. Women should inform their doctor if they wish to become pregnant or think they might be pregnant. There is a potential for serious side effects to an unborn child. Talk to your health care professional or pharmacist for more information. Men may have a reduced sperm count while they are taking this medicine. Talk to your health care professional for more information. This medicine may increase your risk of getting certain kinds of cancer. Talk to your doctor about healthy lifestyle choices, important screenings, and your risk. What side effects may I notice from receiving this medicine? Side effects that you should report to your doctor or health care professional as soon as possible:  allergic reactions like skin rash, itching or hives, swelling of the face, lips, or tongue  changes in vision  confusion  fever, chills, or any other sign of infection  loss of balance or coordination  severe stomach pain  unusual bleeding, bruising  unusually weak or tired  vomiting  yellowing of the eyes or skin Side effects that usually do not require medical attention (report to your doctor or health  care professional if they continue or are bothersome):  hair loss  nausea This list may not describe all possible side effects. Call your doctor for medical advice about side effects. You may report side effects to FDA at 1-800-FDA-1088. Where should I keep my medicine? Keep out of the reach of children. Store at room temperature between 15 and 25 degrees C (59 and 77 degrees F). Protect from light. Throw away any unused medicine after the expiration date. NOTE: This sheet is a summary. It may not cover all possible information. If you have questions about this medicine, talk to your doctor, pharmacist, or health care provider.  2021 Elsevier/Gold Standard (2014-01-15 12:00:31)  Standing Labs We placed an order today for your standing lab work.   Please have your standing labs drawn in June and every 3 months  If possible, please have your labs drawn 2 weeks prior to your appointment so that the provider can discuss your results at your appointment.  We have open lab daily Monday through Thursday from 1:30-4:30 PM and Friday from 1:30-4:00 PM at the office of Dr. Bo Merino, [facility].   Please be advised, all patients with office appointments requiring lab work will take precedents over walk-in lab work.  If possible, please come for your lab work on Monday and Friday afternoons, as you may experience shorter wait times. The office is located at [address], [place], [place], [place] [address] No appointment is necessary.   Labs are drawn by [facility]. Please bring your co-pay at the time of your lab draw.  You may receive a bill from [facility] for your lab work.  If you wish to have your labs drawn at another location, please call the office 24 hours in advance to send orders.  If you have any questions regarding directions or hours of operation,  please call [phone].   As a reminder, please drink plenty of water prior to coming for your lab work.  Thanks!   COVID-19 vaccine recommendations:   COVID-19 vaccine is recommended for everyone (unless you are allergic to a vaccine component), even if you are on a medication that suppresses your immune system.   The recommendations are that the patients on immunosuppressive therapy should get first 3 COVID-19 vaccines 1 month apart and then a fourth dose (booster) 3 months after the third dose.  Do not take Tylenol or any anti-inflammatory medications (NSAIDs) 24 hours prior to the COVID-19 vaccination.   There is no direct evidence about the efficacy of the COVID-19 vaccine in individuals who are on medications that suppress the immune system.   Even if you are fully vaccinated, and you are on any medications that suppress your immune system, please continue to wear a mask, maintain at least six feet social distance and practice hand hygiene.   If you develop a COVID-19 infection, please contact your PCP or our office to determine if you need monoclonal antibody infusion.  The booster vaccine is now available for immunocompromised patients.   Please see the following web sites for updated information.   [email or web address]  Vaccines You are taking a medication(s) that can suppress your immune system.  The following immunizations are recommended: . Flu annually . Covid-19  . Pneumonia (Pneumovax 23 and Prevnar 13 spaced  at least 1 year apart) . Shingrix (after age 63)  Please check with your PCP to make sure you are up to date.

## 2021-02-23 ENCOUNTER — Encounter: Payer: Self-pay | Admitting: Rheumatology

## 2021-02-26 NOTE — Progress Notes (Signed)
Office Visit Note  Patient: Crystal Gregory             Date of Birth: 03-09-1932           MRN: [id number]             PCP: Pleas Koch, NP Referring: Pleas Koch, NP Visit Date: 03/06/2021 Occupation: @GUAROCC @  Subjective:  Medication monitoring.   History of Present Illness: Crystal Gregory is a 87 y.o. female with a history of rheumatoid arthritis and ILD.  She denies having any joint pain or joint swelling.  She has been tolerating Imuran well.  She states that she continues to have some cough and shortness of breath.  She is planning to schedule an appointment with Dr. Patsey Berthold.  Activities of Daily Living:  Patient reports morning stiffness for all day.  Patient Denies nocturnal pain.  Difficulty dressing/grooming: Denies Difficulty climbing stairs: Denies Difficulty getting out of chair: Denies Difficulty using hands for taps, buttons, cutlery, and/or writing: Reports  Review of Systems  Constitutional: Positive for fatigue.  HENT: Negative for mouth sores, mouth dryness and nose dryness.   Eyes: Negative for pain, itching and dryness.  Respiratory: Positive for cough, shortness of breath and difficulty breathing.   Cardiovascular: Negative for chest pain and palpitations.  Gastrointestinal: Negative for blood in stool, constipation and diarrhea.  Endocrine: Negative for increased urination.  Genitourinary: Negative for difficulty urinating.  Musculoskeletal: Positive for arthralgias, joint pain, joint swelling and morning stiffness. Negative for myalgias, muscle tenderness and myalgias.  Skin: Negative for color change, rash and redness.  Allergic/Immunologic: Negative for susceptible to infections.  Neurological: Negative for dizziness, numbness, headaches, memory loss and weakness.  Hematological: Negative for bruising/bleeding tendency.  Psychiatric/Behavioral: Negative for confusion.    PMFS History:  Patient Active Problem List   Diagnosis Date  Noted  . B12 deficiency 09/02/2020  . Anemia 08/06/2020  . Lower extremity edema 03/06/2018  . TMJ (temporomandibular joint syndrome) 03/02/2018  . RA (rheumatoid arthritis) ([place]) 07/13/2017  . Polyarthralgia 04/25/2017  . ILD (interstitial lung disease) ([place]) 06/18/2016  . IPF (idiopathic pulmonary fibrosis) ([place]) 03/23/2016  . Bronchiectasis without complication ([place]) 23/53/6144  . Allergic rhinitis 03/22/2016  . Anal fissure 08/18/2015  . Insomnia 08/18/2015  . Osteoporosis 02/13/2015  . Stress due to illness of family member 09/16/2014  . Hiatal hernia 10/13/2012  . GERD (gastroesophageal reflux disease)   . Allergy     Past Medical History:  Diagnosis Date  . Allergy   . CAP (community acquired pneumonia) 08/23/2017  . Chest pain    a. 09/04/2018 MV: EF >65%. No ischemia/infarct.  . Coronary artery calcification seen on CT scan   . Diastolic dysfunction    a. 10/2016 Echo: EF 60-65%, no rwma, Gr1 DD, mild MR, nl RV fxn, mild to mod TR, PASP 9mmHg.  Marland Kitchen Dyspnea   . GERD (gastroesophageal reflux disease)   . Interstitial lung disorders ([place])   . Pulmonary fibrosis ([place])   . Septicemia ([place]) 1999   spent 4 weeks in ICU, intubated -? PNA    Family History  Problem Relation Age of Onset  . Parkinson's disease Mother   . Colon polyps Mother 74          . Ulcers Father   . Rheumatic fever Father   . Macular degeneration Sister   . Healthy Son   . Healthy Daughter    Past Surgical History:  Procedure Laterality Date  . ABDOMINAL HYSTERECTOMY    .  FLEXIBLE BRONCHOSCOPY N/A 07/05/2016   Procedure: FLEXIBLE BRONCHOSCOPY;  Surgeon: Vilinda Boehringer, MD;  Location: [facility] ORS;  Service: Cardiopulmonary;  Laterality: N/A;   Social History   Social History Narrative   Married.     2 children.  Lives with her husband in [place].   Immunization History  Administered Date(s) Administered  . Fluad Quad(high Dose 65+) 07/24/2019  . Influenza Split 06/04/2012  . Influenza,  High Dose Seasonal PF 09/10/2016  . Influenza,inj,Quad PF,6+ Mos 07/04/2013, 08/18/2015, 07/13/2017, 06/19/2018  . Influenza-Unspecified 07/15/2020  . Moderna SARS-COV2 Booster Vaccination 05/19/2020, 02/19/2021  . Moderna Sars-Covid-2 Vaccination 10/22/2019, 11/19/2019  . Pneumococcal Conjugate-13 10/06/2011  . Pneumococcal Polysaccharide-23 06/19/2018  . Zoster, Live 01/14/2007     Objective: Vital Signs: BP 118/66 (BP Location: Left Arm, Patient Position: Sitting, Cuff Size: Normal)   Pulse 73   Resp 13   Ht 5\' 7"  (1.702 m)   Wt 113 lb (51.3 kg)   BMI 17.70 kg/m    Physical Exam Vitals and nursing note reviewed.  Constitutional:      Appearance: She is well-developed.  HENT:     Head: Normocephalic and atraumatic.  Eyes:     Conjunctiva/sclera: Conjunctivae normal.  Cardiovascular:     Rate and Rhythm: Normal rate and regular rhythm.     Heart sounds: Normal heart sounds.  Pulmonary:     Effort: Pulmonary effort is normal.     Breath sounds: Normal breath sounds.  Abdominal:     General: Bowel sounds are normal.     Palpations: Abdomen is soft.  Musculoskeletal:     Cervical back: Normal range of motion.  Lymphadenopathy:     Cervical: No cervical adenopathy.  Skin:    General: Skin is warm and dry.     Capillary Refill: Capillary refill takes less than 2 seconds.  Neurological:     Mental Status: She is alert and oriented to person, place, and time.  Psychiatric:        Behavior: Behavior normal.      Musculoskeletal Exam: C-spine was in good range of motion.  Shoulder joints and wrist joints with good range of motion.  She has some limitation range of motion of her right wrist joint.  She has some synovial thickening over MCP joints but no synovitis was noted.  Hip joints, knee joints, ankles and MTPs with good range of motion with no synovitis.  Osteoarthritic changes were noted in the bilateral MTPs..  CDAI Exam: CDAI Score: 0.4  Patient Global: 2 mm;  Provider Global: 2 mm Swollen: 0 ; Tender: 0  Joint Exam 03/06/2021   No joint exam has been documented for this visit   There is currently no information documented on the homunculus. Go to the Rheumatology activity and complete the homunculus joint exam.  Investigation: No additional findings.  Imaging: No results found.  Recent Labs: Lab Results  Component Value Date   WBC 6.2 12/30/2020   HGB 10.5 (L) 12/30/2020   PLT 321 12/30/2020   NA 138 12/30/2020   K 4.1 12/30/2020   CL 101 12/30/2020   CO2 25 12/30/2020   GLUCOSE 107 (H) 12/30/2020   BUN 16 12/30/2020   CREATININE 0.70 12/30/2020   BILITOT 1.0 12/30/2020   ALKPHOS 39 12/30/2020   AST 19 12/30/2020   ALT 9 12/30/2020   PROT 7.3 12/30/2020   ALBUMIN 3.9 12/30/2020   CALCIUM 8.8 (L) 12/30/2020   GFRAA >60 08/25/2018    Speciality Comments: Plaquenil and  leflunomide-inadequate response Imuran 125 mg p.o. daily started May 2019.  We discussed switching to CellCept which she declined.  Procedures:  No procedures performed Allergies: Patient has no known allergies.   Assessment / Plan:     Visit Diagnoses: Seropositive rheumatoid arthritis ([place]) - dxd 2018 by Dr. Meda Coffee.  On Imuran since 2019.  She has synovial thickening but no active synovitis.  Her symptoms are quite well controlled on Imuran.  She states she has been taking 1 to 2 mg of prednisone on as needed basis for many years.- Plan: Sedimentation rate, Rheumatoid factor, Cyclic citrul peptide antibody, IgG  High risk medication use - Imuran 50 mg p.o. every morning and 75 mg p.o. every afternoon since 2019.  Prednisone 2 mg p.o. daily.  Side effects of prednisone were reviewed.  The prescription for prednisone was refilled per request.  (Plaquenil and leflunomide-inadequate response) -we will check labs today and then every 3 months to monitor for drug toxicity.  Plan: CBC with Differential/Platelet, COMPLETE METABOLIC PANEL WITH GFR, Hepatitis B core  antibody, IgM, Hepatitis B surface antigen, Hepatitis C antibody, QuantiFERON-TB Gold Plus, Thiopurine methyltransferase(tpmt)rbc.  She has received all of her COVID-19 vaccines and a booster.  I have given her updated information regarding the vaccination which was placed in the AVS.  She has been advised to stop Imuran in case she develops an infection and then restart a month infection resolves.  Information was placed in the AVS.  We will call her back with the lab results when available.  ILD (interstitial lung disease) ([facility]) -   ILD is a stable for Dr. Patsey Berthold.  Patient states she has been experiencing increased cough and shortness of breath recently.  She will schedule an appointment with Dr. Patsey Berthold.  IPF (idiopathic pulmonary fibrosis) ([facility]) - History of ARDS from nitrofurantoin  Bronchiectasis without complication ([place]) - She is on azithromycin 3 times a week for prophylaxis.  TMJ (temporomandibular joint syndrome) - Left TMJ discomfort.  Age-related osteoporosis without current pathological fracture - January 22, 2020 DEXA the BMD measured at Forearm Radius 33% is 0.604 g/cm2 with a T-score-3.1.  She is on Prolia by her PCP. - Plan: VITAMIN D 25 Hydroxy (Vit-D Deficiency, Fractures)  History of gastroesophageal reflux (GERD) - History of esophageal motility.  She is on PPIs.  Hiatal hernia  B12 deficiency  Orders: Orders Placed This Encounter  Procedures  . CBC with Differential/Platelet  . COMPLETE METABOLIC PANEL WITH GFR  . Sedimentation rate  . Rheumatoid factor  . Cyclic citrul peptide antibody, IgG  . Hepatitis B core antibody, IgM  . Hepatitis B surface antigen  . Hepatitis C antibody  . QuantiFERON-TB Gold Plus  . Thiopurine methyltransferase(tpmt)rbc  . VITAMIN D 25 Hydroxy (Vit-D Deficiency, Fractures)   Meds ordered this encounter  Medications  . predniSONE (DELTASONE) 1 MG tablet    Sig: Take 2 tablets (2 mg total) by mouth daily with breakfast.     Dispense:  180 tablet    Refill:  1     Follow-Up Instructions: Return in about 5 months (around 08/06/2021) for Rheumatoid arthritis, ILD.   Bo Merino, MD  Note - This record has been created using Editor, commissioning.  Chart creation errors have been sought, but may not always  have been located. Such creation errors do not reflect on  the standard of medical care.

## 2021-03-05 ENCOUNTER — Ambulatory Visit: Payer: Medicare Other | Admitting: Rheumatology

## 2021-03-06 ENCOUNTER — Encounter: Payer: Self-pay | Admitting: Rheumatology

## 2021-03-06 ENCOUNTER — Other Ambulatory Visit: Payer: Self-pay

## 2021-03-06 ENCOUNTER — Ambulatory Visit (INDEPENDENT_AMBULATORY_CARE_PROVIDER_SITE_OTHER): Payer: Medicare Other | Admitting: Rheumatology

## 2021-03-06 VITALS — BP 118/66 | HR 73 | Resp 13 | Ht 67.0 in | Wt 113.0 lb

## 2021-03-06 DIAGNOSIS — Z8719 Personal history of other diseases of the digestive system: Secondary | ICD-10-CM

## 2021-03-06 DIAGNOSIS — E538 Deficiency of other specified B group vitamins: Secondary | ICD-10-CM | POA: Diagnosis not present

## 2021-03-06 DIAGNOSIS — Z79899 Other long term (current) drug therapy: Secondary | ICD-10-CM

## 2021-03-06 DIAGNOSIS — M26609 Unspecified temporomandibular joint disorder, unspecified side: Secondary | ICD-10-CM

## 2021-03-06 DIAGNOSIS — J84112 Idiopathic pulmonary fibrosis: Secondary | ICD-10-CM

## 2021-03-06 DIAGNOSIS — J479 Bronchiectasis, uncomplicated: Secondary | ICD-10-CM

## 2021-03-06 DIAGNOSIS — K449 Diaphragmatic hernia without obstruction or gangrene: Secondary | ICD-10-CM

## 2021-03-06 DIAGNOSIS — J849 Interstitial pulmonary disease, unspecified: Secondary | ICD-10-CM

## 2021-03-06 DIAGNOSIS — M059 Rheumatoid arthritis with rheumatoid factor, unspecified: Secondary | ICD-10-CM

## 2021-03-06 DIAGNOSIS — M810 Age-related osteoporosis without current pathological fracture: Secondary | ICD-10-CM

## 2021-03-06 MED ORDER — PREDNISONE 1 MG PO TABS: 2.0000 mg | ORAL_TABLET | Freq: Every day | ORAL | 1 refills | Status: DC

## 2021-03-06 NOTE — Patient Instructions (Signed)
Standing Labs We placed an order today for your standing lab work.   Please have your standing labs drawn in September and every 3 months  If possible, please have your labs drawn 2 weeks prior to your appointment so that the provider can discuss your results at your appointment.  Please note that you may see your imaging and lab results in [facility] before we have reviewed them. We may be awaiting multiple results to interpret others before contacting you. Please allow our office up to 72 hours to thoroughly review all of the results before contacting the office for clarification of your results.  We have open lab daily: Monday through Thursday from 1:30-4:30 PM and Friday from 1:30-4:00 PM at the office of Dr. Bo Merino, [facility].   Please be advised, all patients with office appointments requiring lab work will take precedent over walk-in lab work.  If possible, please come for your lab work on Monday and Friday afternoons, as you may experience shorter wait times. The office is located at [address], [place], [place], [place] [address] No appointment is necessary.   Labs are drawn by [facility]. Please bring your co-pay at the time of your lab draw.  You may receive a bill from [facility] for your lab work.  If you wish to have your labs drawn at another location, please call the office 24 hours in advance to send orders.  If you have any questions regarding directions or hours of operation,  please call [phone].   As a reminder, please drink plenty of water prior to coming for your lab work. Thanks!  Vaccines You are taking a medication(s) that can suppress your immune system.  The following immunizations are recommended: . Flu annually . Covid-19  . Td/Tdap (tetanus, diphtheria, pertussis) every 10 years . Pneumonia (Prevnar 15 then Pneumovax 23 at least 1 year apart.  Alternatively, can take Prevnar 20 without needing additional dose) . Shingrix (after age  49): 2 doses from 4 weeks to 6 months apart  Please check with your PCP to make sure you are up to date.  If you have signs or symptoms of an infection or start antibiotics: . First, call your PCP for workup of your infection. . Hold your medication through the infection, until you complete your antibiotics, and until symptoms resolve if you take the following: o Injectable medication (Actemra, Benlysta, Cimzia, Cosentyx, Enbrel, Humira, Kevzara, Orencia, Remicade, Simponi, Stelara, Taltz, Tremfya) o Methotrexate o Leflunomide (Arava) o Mycophenolate (Cellcept) o Morrie Sheldon, Olumiant, or Rinvoq

## 2021-03-09 NOTE — Progress Notes (Signed)
Anemia persists.  CMP is normal.  Sed rate is 92, rheumatoid factor and anti-CCP are positive.  Hepatitis panel is negative.  TB gold was negative.  Vitamin D is normal.  TPMT is pending.

## 2021-03-11 ENCOUNTER — Other Ambulatory Visit: Payer: Self-pay

## 2021-03-11 ENCOUNTER — Encounter: Payer: Self-pay | Admitting: Pulmonary Disease

## 2021-03-11 ENCOUNTER — Ambulatory Visit (INDEPENDENT_AMBULATORY_CARE_PROVIDER_SITE_OTHER): Payer: Medicare Other | Admitting: Pulmonary Disease

## 2021-03-11 VITALS — BP 120/62 | HR 78 | Temp 97.1°F | Ht 67.0 in | Wt 113.0 lb

## 2021-03-11 DIAGNOSIS — J479 Bronchiectasis, uncomplicated: Secondary | ICD-10-CM | POA: Diagnosis not present

## 2021-03-11 DIAGNOSIS — J849 Interstitial pulmonary disease, unspecified: Secondary | ICD-10-CM | POA: Diagnosis not present

## 2021-03-11 DIAGNOSIS — K224 Dyskinesia of esophagus: Secondary | ICD-10-CM

## 2021-03-11 DIAGNOSIS — R0602 Shortness of breath: Secondary | ICD-10-CM

## 2021-03-11 DIAGNOSIS — M059 Rheumatoid arthritis with rheumatoid factor, unspecified: Secondary | ICD-10-CM

## 2021-03-11 NOTE — Patient Instructions (Addendum)
Start taking Zyrtec (cetirizine) 10 mg at bedtime regularly.  We have made a referral to Dr. Carlean Purl, gastroenterologist, at [facility] in [place].  We are scheduling a CT scan of the chest and breathing test.  We will see you in follow-up in 4 to 6 weeks time.  You may see one of my nurse practitioners at that time but rest assured that I work very closely with them.

## 2021-03-11 NOTE — Progress Notes (Signed)
Subjective:    Patient ID: Crystal Gregory, female    DOB: 18-Mar-1932, 87 y.o.   MRN: [id number] Chief Complaint  Patient presents with  . Follow-up    Sob with exertion and prod cough with yellow to green sputum   PROBLEMS: History of prior ARDS 1999 Prior long term nitrofurantoin therapy Pulmonary fibrosis Bronchiectasis Mild COPD  PT PROFILE: 87 y.o.with remote minimal smoking history (15 PY, quit 1982) initially evaluated by Dr Stevenson Clinch 03/2016 and subsequently by Dr Chase Caller for pulmonary fibrosis. Pt has prior history of severe ARDS (1999) and has been on chronic nitrofurantoin for recurrent UTIs. She has been tried on 2 maintenance inhalers without discernible benefit.  DATA: CT chest 03/22/16:Generalized pulmonary hyperinflation but without bullous emphysema. Widespread bronchiectasis with bronchial wall thickening consistent with inflammatory bronchitis. Areas of pulmonary fibrosis, most pronounced in the lower lobes, where the bronchiectasis is more extensive, including areas of pulmonary lung destruction HRCT chest 06/11/16:Diffuse cylindrical and varicoid bronchiectasis throughout both lungs, most severe at the lung bases. Basilar predominant fibrotic interstitial lung disease with patchy honeycombing, with mild progression in the short interval since 03/22/2016 Spirometry 06/18/16: Mild obstruction, FEV1 1.42 liters (63%), FVC 2.43 (80%) Bronchoscopy 07/05/16:Normal airway exam. BAL negative for AFB Echocardiogram 10/07/15:LVEF 60%, Grade I DD, mild MR, RVSP estimate 35 mmHg HRCT 11/04/16:again compatible with interstitial lung disease, and considered diagnostic of usual interstitial pneumonia (UIP) from an imaging standpoint. There has been no significant progression of disease compared to the recent prior examination PFT 02/10/17: no obstruction, normal TLC, moderate reduction in DLCO 6MWT 03/11/17:345 m. No desaturation PFTs 11/22/17:No obstruction. Lung volumes  normal. DLCO moderately reduced (55% predicted). [place] since 02/10/17 PFTs 11/23/18 : FVC: 2.67 L (95 %pred), FEV1: 2.35 L (112 %pred), FEV1/FVC: 88%, TLC: 4.98 L (88 %pred), DLCO 42 %pred 2D echo 11/16/2019:Left ventricular ejection fraction, by estimation, is 60 to 65%. Left ventricular diastolic parameters were normal.Right ventricular systolic function is normal. The right ventricular size is normal. Mildly elevated pulmonary artery systolic pressure. The estimated right ventricular systolic pressure is 46.9 mmHg. Left atrial size was mildly dilated.Tricuspid valve regurgitation is mild to moderate CT chest 02/15/2020:The appearance of the lungs is compatible with interstitial lung disease, with a spectrum of findings considered diagnostic of usual interstitial pneumonia (UIP) per current ATS guidelines. Minimal progression compared to the prior examination. Patulous esophagus noted. Barium swallow study 03/05/2020: Tertiary contractions of the esophagus mild spasm, none episode of tracheal aspiration noted without cough reflex, mild GERD. Overnight pulse oximetry 08/18/2020: No evidence of significant nocturnal desaturation. PFTs 08/31/2020: FEV1 2.16 L or 102% predicted, FVC 2.30 L or 80% predicted, FEV1 FVC 94% predicted, no bronchodilator response.  TLC 4.46 L or 78% predicted, DLCO 37% predicted.  INTERVAL: Last encounter here was 02 December 2020: Cough was well controlled at that time.  She was referred to Dr. Estanislado Pandy for her rheumatoid arthritis needs at her request.  In the interim, cough has now worsened again.  HPI Crystal Gregory is an 87 year old very remote former smoker, who follows for the issue of interstitial lung disease of multifactorial etiology (ARDS, prior long-term nitrofurantoin therapy and rheumatoid arthritis).  This is a scheduled visit.  She has chronic issues with cough which is for the most part nonproductive but occasionally productive of yellow to greenish sputum.  At a prior  visit her cough had been well controlled and she was doing well.  Since then she has had a gradual increase in the symptoms particularly at nighttime when  she is recumbent and initially upon arising.  Recall that on her CT scan of May 2021 it was noted that she had a patulous esophagus a barium swallow study showed that she had tertiary contractions in the esophagus occasional spasm episode of tracheal aspiration that occurred without triggering the cough reflex at that time. She has been on Protonix twice a day.  She has gastroesophageal reflux symptoms on occasion.  She has not had any fevers, chills or sweats.  No hemoptysis.  She is compliant with Azithromycin 3 times a week for prevention of bronchiectasis exacerbations.  She does not endorse any other symptomatology today.   Review of Systems A 10 point review of systems was performed and it is as noted above otherwise negative.  Patient Active Problem List   Diagnosis Date Noted  . B12 deficiency 09/02/2020  . Anemia 08/06/2020  . Lower extremity edema 03/06/2018  . TMJ (temporomandibular joint syndrome) 03/02/2018  . RA (rheumatoid arthritis) ([place]) 07/13/2017  . Polyarthralgia 04/25/2017  . ILD (interstitial lung disease) ([name]) 06/18/2016  . IPF (idiopathic pulmonary fibrosis) ([place]) 03/23/2016  . Bronchiectasis without complication ([place]) 10/62/6948  . Allergic rhinitis 03/22/2016  . Anal fissure 08/18/2015  . Insomnia 08/18/2015  . Osteoporosis 02/13/2015  . Stress due to illness of family member 09/16/2014  . Hiatal hernia 10/13/2012  . GERD (gastroesophageal reflux disease)   . Allergy    Social History   Tobacco Use  . Smoking status: Former Smoker    Packs/day: 0.25    Years: 20.00    Pack years: 5.00    Quit date: 01/16/1981    Years since quitting: 40.1  . Smokeless tobacco: Never Used  Substance Use Topics  . Alcohol use: Yes    Comment: 2 drinks daily    No Known Allergies Current Meds  Medication Sig  .  azaTHIOprine (IMURAN) 50 MG tablet Take 75 mg every morning and 50 mg every evening.  Marland Kitchen azithromycin (ZITHROMAX) 250 MG tablet Take 1 tablet (250 mg total) by mouth 3 (three) times a week.  . denosumab (PROLIA) 60 MG/ML SOLN injection Inject 60 mg into the skin every 6 (six) months. Administer in upper arm, thigh, or abdomen  . gabapentin (NEURONTIN) 100 MG capsule Take 1 capsule (100 mg total) by mouth at bedtime.  . Multiple Minerals-Vitamins (CALCIUM & VIT D3 BONE HEALTH PO) Take 1 Dose by mouth daily.  . pantoprazole (PROTONIX) 20 MG tablet Take 1 tablet (20 mg total) by mouth 2 (two) times daily. For heartburn.  . predniSONE (DELTASONE) 1 MG tablet Take 2 tablets (2 mg total) by mouth daily with breakfast.  . propranolol (INDERAL) 10 MG tablet Take 1 tablet (10 mg total) by mouth 2 (two) times daily. For tremor.  . traZODone (DESYREL) 50 MG tablet TAKE 1 TABLET AT BEDTIME FOR SLEEP  . vitamin B-12 (CYANOCOBALAMIN) 500 MCG tablet Take 500 mcg by mouth daily.   Immunization History  Administered Date(s) Administered  . Fluad Quad(high Dose 65+) 07/24/2019  . Influenza Split 06/04/2012  . Influenza, High Dose Seasonal PF 09/10/2016  . Influenza,inj,Quad PF,6+ Mos 07/04/2013, 08/18/2015, 07/13/2017, 06/19/2018  . Influenza-Unspecified 07/15/2020  . Moderna SARS-COV2 Booster Vaccination 05/19/2020, 02/19/2021  . Moderna Sars-Covid-2 Vaccination 10/22/2019, 11/19/2019  . Pneumococcal Conjugate-13 10/06/2011  . Pneumococcal Polysaccharide-23 06/19/2018  . Zoster, Live 01/14/2007       Objective:   Physical Exam BP 120/62 (BP Location: Left Arm, Cuff Size: Normal)   Pulse 78   Temp (!) 97.1  F (36.2 C) (Temporal)   Ht 5\' 7"  (1.702 m)   Wt 113 lb (51.3 kg)   SpO2 98%   BMI 17.70 kg/m  GENERAL: Thin well-developed woman in no acute distress.  Fully ambulatory.  She is chronically pale.  Mild tachypnea at baseline but no conversational dyspnea. HEAD: Normocephalic, atraumatic.  EYES:  Pupils equal, round, reactive to light.  No scleral icterus.  MOUTH: Nose/mouth/throat not examined due to masking requirements for COVID 19. NECK: Supple. No thyromegaly. Trachea midline. No JVD.  No adenopathy. PULMONARY: Good air entry bilaterally.  Coarse breath sounds at the bases, faint dry crackles. CARDIOVASCULAR: S1 and S2. Regular rate and rhythm.  No rubs, murmurs or gallops heard. ABDOMEN: Nondistended,  MUSCULOSKELETAL: No joint deformity, no clubbing, no edema.  NEUROLOGIC: No overt focal deficit, no gait disturbance, speech is fluent. SKIN: Intact,warm,dry. PSYCH: Mood and behavior are normal.  Ambulatory oximetry was performed today: No desaturations O2 sats remained at 99 to 100%.     Assessment & Plan:     ICD-10-CM   1. Interstitial pulmonary disease (HCC)  J84.9 CT Chest High Resolution    Pulmonary Function Test [facility] Only   Multifactorial Prior ARDS Prior long-term nitrofurantoin therapy Rheumatoid arthritis  2. Bronchiectasis without complication (HCC)  J81.1    Continue Azithromycin 3 times a week  3. Esophageal dysfunction  K22.4 Ambulatory referral to Gastroenterology   On prior esophagogram aspiration noted Has esophageal dysmotility Will refer to GI for evaluation, Dr. Carlean Purl  4. Shortness of breath  R06.02 Pulmonary Function Test [facility] Only   Chronic issue Reassess ILD with PFTs/high-res chest CT  5. Seropositive rheumatoid arthritis ([place])  M05.9    This issue adds complexity to her management   Orders Placed This Encounter  Procedures  . CT Chest High Resolution    Standing Status:   Future    Standing Expiration Date:   03/11/2022    Scheduling Instructions:     Next available.    Order Specific Question:   Preferred imaging location?    Answer:   [facility]  . Ambulatory referral to Gastroenterology    Referral Priority:   Routine    Referral Type:   Consultation    Referral Reason:   Specialty Services Required    Number of Visits  Requested:   1  . Pulmonary Function Test [facility] Only    Next available    Standing Status:   Future    Standing Expiration Date:   03/11/2022    Order Specific Question:   Full PFT: includes the following: basic spirometry, spirometry pre & post bronchodilator, diffusion capacity (DLCO), lung volumes    Answer:   Full PFT   We will reassess the patient's pulmonary function.  She in the past has not responded well to inhalers for control of her cough.  We will add cetirizine at bedtime see if this helps with her symptoms.  Continue Protonix for now.  Will refer to gastroenterology as ongoing issues with reflux may be aggravating the cough symptom.  We will see her in follow-up in weeks time she is to contact us prior to that time should any new difficulties arise.  Renold Don, MD [facility]   *This note was dictated using voice recognition software/Dragon.  Despite best efforts to proofread, errors can occur which can change the meaning.  Any change was purely unintentional.

## 2021-03-12 LAB — CBC WITH DIFFERENTIAL/PLATELET
Absolute Monocytes: 444 cells/uL (ref 200–950)
Basophils Absolute: 31 cells/uL (ref 0–200)
Basophils Relative: 0.6 %
Eosinophils Absolute: 41 cells/uL (ref 15–500)
Eosinophils Relative: 0.8 %
HCT: 29.6 % — ABNORMAL LOW (ref 35.0–45.0)
Hemoglobin: 10 g/dL — ABNORMAL LOW (ref 11.7–15.5)
Lymphs Abs: 1341 cells/uL (ref 850–3900)
MCH: 37.5 pg — ABNORMAL HIGH (ref 27.0–33.0)
MCHC: 33.8 g/dL (ref 32.0–36.0)
MCV: 110.9 fL — ABNORMAL HIGH (ref 80.0–100.0)
MPV: 8.6 fL (ref 7.5–12.5)
Monocytes Relative: 8.7 %
Neutro Abs: 3244 cells/uL (ref 1500–7800)
Neutrophils Relative %: 63.6 %
Platelets: 375 10*3/uL (ref 140–400)
RBC: 2.67 10*6/uL — ABNORMAL LOW (ref 3.80–5.10)
RDW: 15.4 % — ABNORMAL HIGH (ref 11.0–15.0)
Total Lymphocyte: 26.3 %
WBC: 5.1 10*3/uL (ref 3.8–10.8)

## 2021-03-12 LAB — COMPLETE METABOLIC PANEL WITH GFR
AG Ratio: 1.4 (calc) (ref 1.0–2.5)
ALT: 7 U/L (ref 6–29)
AST: 15 U/L (ref 10–35)
Albumin: 4.1 g/dL (ref 3.6–5.1)
Alkaline phosphatase (APISO): 48 U/L (ref 37–153)
BUN: 19 mg/dL (ref 7–25)
CO2: 27 mmol/L (ref 20–32)
Calcium: 9.3 mg/dL (ref 8.6–10.4)
Chloride: 101 mmol/L (ref 98–110)
Creat: 0.68 mg/dL (ref 0.60–0.88)
GFR, Est African American: 91 mL/min/{1.73_m2} (ref >=60)
GFR, Est Non African American: 79 mL/min/{1.73_m2} (ref >=60)
Globulin: 3 g/dL (calc) (ref 1.9–3.7)
Glucose, Bld: 94 mg/dL (ref 65–99)
Potassium: 4.6 mmol/L (ref 3.5–5.3)
Sodium: 137 mmol/L (ref 135–146)
Total Bilirubin: 0.7 mg/dL (ref 0.2–1.2)
Total Protein: 7.1 g/dL (ref 6.1–8.1)

## 2021-03-12 LAB — SEDIMENTATION RATE: Sed Rate: 92 mm/h — ABNORMAL HIGH (ref 0–30)

## 2021-03-12 LAB — QUANTIFERON-TB GOLD PLUS
Mitogen-NIL: 8.83 IU/mL
NIL: 0.02 IU/mL
QuantiFERON-TB Gold Plus: NEGATIVE
TB1-NIL: 0.01 IU/mL
TB2-NIL: 0.01 IU/mL

## 2021-03-12 LAB — VITAMIN D 25 HYDROXY (VIT D DEFICIENCY, FRACTURES): Vit D, 25-Hydroxy: 65 ng/mL (ref 30–100)

## 2021-03-12 LAB — RHEUMATOID FACTOR: Rheumatoid fact SerPl-aCnc: 195 IU/mL — ABNORMAL HIGH (ref <14)

## 2021-03-12 LAB — HEPATITIS B SURFACE ANTIGEN: Hepatitis B Surface Ag: NONREACTIVE

## 2021-03-12 LAB — THIOPURINE METHYLTRANSFERASE (TPMT), RBC: Thiopurine Methyltransferase, RBC: 20 nmol/hr/mL RBC

## 2021-03-12 LAB — HEPATITIS C ANTIBODY
Hepatitis C Ab: NONREACTIVE
SIGNAL TO CUT-OFF: 0.04 (ref <1.00)

## 2021-03-12 LAB — CYCLIC CITRUL PEPTIDE ANTIBODY, IGG: Cyclic Citrullin Peptide Ab: 93 UNITS — ABNORMAL HIGH

## 2021-03-12 LAB — HEPATITIS B CORE ANTIBODY, IGM: Hep B C IgM: NONREACTIVE

## 2021-03-20 ENCOUNTER — Telehealth: Payer: Self-pay

## 2021-03-20 ENCOUNTER — Other Ambulatory Visit: Payer: Medicare Other

## 2021-03-20 NOTE — Telephone Encounter (Signed)
Called and spoke with pt about upcoming Covid test, pt had a clear understanding.

## 2021-03-22 ENCOUNTER — Encounter: Payer: Self-pay | Admitting: Oncology

## 2021-03-24 ENCOUNTER — Encounter: Payer: Self-pay | Admitting: Oncology

## 2021-03-24 ENCOUNTER — Other Ambulatory Visit
Admission: RE | Admit: 2021-03-24 | Discharge: 2021-03-24 | Disposition: A | Discharge status: Home / Self Care | Payer: Medicare Other | Source: Ambulatory Visit | Attending: Pulmonary Disease | Admitting: Pulmonary Disease

## 2021-03-24 ENCOUNTER — Other Ambulatory Visit: Payer: Self-pay

## 2021-03-24 ENCOUNTER — Ambulatory Visit
Admission: RE | Admit: 2021-03-24 | Discharge: 2021-03-24 | Disposition: A | Discharge status: Home / Self Care | Payer: Medicare Other | Source: Ambulatory Visit | Attending: Pulmonary Disease | Admitting: Pulmonary Disease

## 2021-03-24 DIAGNOSIS — J90 Pleural effusion, not elsewhere classified: Secondary | ICD-10-CM | POA: Diagnosis not present

## 2021-03-24 DIAGNOSIS — Z20822 Contact with and (suspected) exposure to covid-19: Secondary | ICD-10-CM | POA: Insufficient documentation

## 2021-03-24 DIAGNOSIS — J849 Interstitial pulmonary disease, unspecified: Secondary | ICD-10-CM | POA: Insufficient documentation

## 2021-03-24 DIAGNOSIS — I2510 Atherosclerotic heart disease of native coronary artery without angina pectoris: Secondary | ICD-10-CM | POA: Diagnosis not present

## 2021-03-24 DIAGNOSIS — Z01812 Encounter for preprocedural laboratory examination: Secondary | ICD-10-CM | POA: Insufficient documentation

## 2021-03-24 DIAGNOSIS — J479 Bronchiectasis, uncomplicated: Secondary | ICD-10-CM | POA: Diagnosis not present

## 2021-03-24 LAB — SARS CORONAVIRUS 2 (TAT 6-24 HRS): SARS Coronavirus 2: NEGATIVE

## 2021-03-25 ENCOUNTER — Other Ambulatory Visit: Payer: Self-pay | Admitting: Pulmonary Disease

## 2021-03-25 ENCOUNTER — Ambulatory Visit: Payer: Medicare Other | Attending: Pulmonary Disease

## 2021-03-25 DIAGNOSIS — R0602 Shortness of breath: Secondary | ICD-10-CM | POA: Diagnosis not present

## 2021-03-25 DIAGNOSIS — J849 Interstitial pulmonary disease, unspecified: Secondary | ICD-10-CM

## 2021-03-25 DIAGNOSIS — H26491 Other secondary cataract, right eye: Secondary | ICD-10-CM | POA: Diagnosis not present

## 2021-03-25 MED ORDER — AMOXICILLIN-POT CLAVULANATE 875-125 MG PO TABS: 1.0000 | ORAL_TABLET | Freq: Two times a day (BID) | ORAL | 0 refills | Status: DC

## 2021-03-25 MED ORDER — ALBUTEROL SULFATE (2.5 MG/3ML) 0.083% IN NEBU
2.5000 mg | INHALATION_SOLUTION | Freq: Once | RESPIRATORY_TRACT | Status: AC
  Administered 2021-03-25: 2.5 mg via RESPIRATORY_TRACT
  Filled 2021-03-25: qty 3

## 2021-03-25 NOTE — Progress Notes (Signed)
Patient's chest CT appears worse than prior.  There is evidence of potential aspiration particular on the right lower lobe.  She has been having difficulties with nocturnal reflux.  She does have a very patulous esophagus and suspect that reflux is a major issue.  We will call Augmentin 875 mg twice daily for aspiration pneumonitis.  She has a follow-up appointment with [place] on 27 June.  Renold Don, MD [facility]

## 2021-03-30 ENCOUNTER — Encounter: Payer: Self-pay | Admitting: Pulmonary Disease

## 2021-03-30 ENCOUNTER — Other Ambulatory Visit: Payer: Self-pay

## 2021-03-30 ENCOUNTER — Ambulatory Visit (INDEPENDENT_AMBULATORY_CARE_PROVIDER_SITE_OTHER): Payer: Medicare Other | Admitting: Pulmonary Disease

## 2021-03-30 VITALS — BP 124/60 | HR 79 | Temp 97.7°F | Ht 67.0 in | Wt 113.0 lb

## 2021-03-30 DIAGNOSIS — K229 Disease of esophagus, unspecified: Secondary | ICD-10-CM | POA: Diagnosis not present

## 2021-03-30 DIAGNOSIS — T17908A Unspecified foreign body in respiratory tract, part unspecified causing other injury, initial encounter: Secondary | ICD-10-CM | POA: Diagnosis not present

## 2021-03-30 DIAGNOSIS — J849 Interstitial pulmonary disease, unspecified: Secondary | ICD-10-CM | POA: Diagnosis not present

## 2021-03-30 DIAGNOSIS — K224 Dyskinesia of esophagus: Secondary | ICD-10-CM | POA: Diagnosis not present

## 2021-03-30 DIAGNOSIS — R059 Cough, unspecified: Secondary | ICD-10-CM | POA: Diagnosis not present

## 2021-03-30 MED ORDER — AMOXICILLIN-POT CLAVULANATE 875-125 MG PO TABS: 1.0000 | ORAL_TABLET | Freq: Two times a day (BID) | ORAL | 0 refills | Status: AC

## 2021-03-30 NOTE — Patient Instructions (Addendum)
We are going to give you 3 more days of the antibiotic  I think that you had pneumonia from things going the wrong way and ending up in the lung  You have some issues with your esophagus that I want to be evaluated by Dr. Carlean Purl, gastroenterologist in [place]  We will review another chest CT and breathing test in 3 months time   You have an appointment with me on 19 July, I would like to see you then to make sure you are doing well

## 2021-04-01 ENCOUNTER — Encounter: Payer: Self-pay | Admitting: Pulmonary Disease

## 2021-04-01 ENCOUNTER — Other Ambulatory Visit
Admission: RE | Admit: 2021-04-01 | Discharge: 2021-04-01 | Disposition: A | Discharge status: Home / Self Care | Payer: Medicare Other | Attending: Pulmonary Disease | Admitting: Pulmonary Disease

## 2021-04-01 DIAGNOSIS — K224 Dyskinesia of esophagus: Secondary | ICD-10-CM

## 2021-04-01 DIAGNOSIS — T17908A Unspecified foreign body in respiratory tract, part unspecified causing other injury, initial encounter: Secondary | ICD-10-CM | POA: Diagnosis not present

## 2021-04-01 DIAGNOSIS — J84112 Idiopathic pulmonary fibrosis: Secondary | ICD-10-CM | POA: Diagnosis not present

## 2021-04-01 DIAGNOSIS — R059 Cough, unspecified: Secondary | ICD-10-CM | POA: Insufficient documentation

## 2021-04-01 NOTE — Progress Notes (Signed)
Subjective:    Patient ID: Akacia Boltz, female    DOB: 1932/09/26, 87 y.o.   MRN: [id number] Chief Complaint  Patient presents with   Follow-up    Sob and non prod cough.    PROBLEMS: History of prior ARDS 1999 Prior long term nitrofurantoin therapy Pulmonary fibrosis Bronchiectasis Mild COPD   PT PROFILE: 87 y.o. with remote minimal smoking history (15 PY, quit 1982) initially evaluated by Dr Stevenson Clinch 03/2016 and subsequently by Dr Chase Caller for pulmonary fibrosis. Pt has prior history of severe ARDS (1999) and has been on chronic nitrofurantoin for recurrent UTIs. She has been tried on 2 maintenance inhalers without discernible benefit.   DATA: CT chest 03/22/16: Generalized pulmonary hyperinflation but without bullous emphysema. Widespread bronchiectasis with bronchial wall thickening consistent with inflammatory bronchitis. Areas of pulmonary fibrosis, most pronounced in the lower lobes, where the bronchiectasis is more extensive, including areas of pulmonary lung destruction HRCT chest 06/11/16: Diffuse cylindrical and varicoid bronchiectasis throughout both lungs, most severe at the lung bases. Basilar predominant fibrotic interstitial lung disease with patchy honeycombing, with mild progression in the short interval since 03/22/2016 Spirometry 06/18/16: Mild obstruction, FEV1 1.42 liters (63%), FVC 2.43 (80%) Bronchoscopy 07/05/16: Normal airway exam. BAL negative for AFB Echocardiogram 10/07/15: LVEF 60%, Grade I DD, mild MR, RVSP estimate 35 mmHg HRCT 11/04/16: again compatible with interstitial lung disease, and considered diagnostic of usual interstitial pneumonia (UIP) from an imaging standpoint. There has been no significant progression of disease compared to the recent prior examination PFT 02/10/17: no obstruction, normal TLC, moderate reduction in DLCO 6MWT 03/11/17: 345 m. No desaturation PFTs 11/22/17: No obstruction.  Lung volumes normal.  DLCO moderately reduced (55%  predicted). [place] since 02/10/17 PFTs 11/23/18 : FVC: 2.67 L (95 %pred), FEV1: 2.35 L (112 %pred), FEV1/FVC: 88%, TLC: 4.98 L (88 %pred), DLCO 42 %pred 2D echo 11/16/2019:Left ventricular ejection fraction, by estimation, is 60 to 65%. Left ventricular diastolic parameters were normal.Right ventricular systolic function is normal. The right ventricular size is normal. Mildly elevated pulmonary artery systolic pressure. The estimated right ventricular systolic pressure is 10.6 mmHg. Left atrial size was mildly dilated.Tricuspid valve regurgitation is mild to moderate CT chest 02/15/2020:The appearance of the lungs is compatible with interstitial lung disease, with a spectrum of findings considered diagnostic of usual interstitial pneumonia (UIP) per current ATS guidelines. Minimal progression compared to the prior examination. Barium swallow study 03/05/2020: Tertiary contractions of the esophagus mild spasm, one episode of tracheal aspiration noted without cough reflex, mild GERD. Overnight pulse oximetry 08/18/2020: No evidence of significant nocturnal desaturation. PFTs 08/31/2020: FEV1 2.16 L or 102% predicted, FVC 2.30 L or 80% predicted, FEV1 FVC 94% predicted, no bronchodilator response.  TLC 4.46 L or 78% predicted, DLCO 37% predicted. CT high-res 03/24/2021: Progressively worsening interstitial lung disease likely UIP.  Markedly worsening bronchiectasis in the right lower lobe with acute airspace consolidation.  Pneumonia considered.  Trace partially loculated right pleural effusion. PFTs 03/25/2021: FEV1 1.38 L or 66% predicted, FVC 1.90 L or 67% predicted FEV1/FVC 73% no bronchodilator response.  Air-trapping noted.  Overall worsening on FEV1 from prior likely related to acute illness diffusion capacity severely reduced.   INTERVAL: Last encounter was for 11 March 2021, at that time she was noticing increasing shortness of breath from her baseline.  Cough also worsened at nighttime.  During that  visit a high-resolution CT of the chest and PFTs were ordered.   HPI Patient is an 87 year old with a very complex history  as noted below.  She has had issues with pulmonary fibrosis are multifactorial as noted.  This is a working evaluation.  Call that she was evaluated on 8 June and that she had increasing symptoms of shortness of breath as well as cough that was worsening at nighttime.  A high-resolution CT scan of the chest and PFTs were obtained.  CT chest was performed on 21 June and PFTs performed on 22 June.  I happened to see the patient as she was waiting for her PFTs and she was quite symptomatic noting that she had copious sputum production at nighttime with worsening cough.  She also had seen the report of her CT scan of the chest which we were to discuss with her and she was concerned.  We moved up her appointment for today to better assess these issues.  On the 22nd we also prescribed Augmentin for potential aspiration pneumonia after evaluation of her CT scan of the chest.  CT scan of the chest shows worsening pulmonary fibrosis changes particularly on the right however she has consolidation consistent with potential aspiration.  She also has a very patulous esophagus which had air-fluid levels and it and retained food and suspect that she is having chronic aspiration.  She has not been evaluated by GI.  Today she states that she is feeling better after initiating Augmentin therapy.  She notes that the cough and the sputum production have markedly improved.  We did discuss the findings from the CT scan of the chest films were reviewed with the patient.  Her PFTs also showed worsening of her restrictive physiology however, she was acutely ill at that time.  She has not had fevers, chills or sweats.  She voices no other complaint and states that overall she feels better than she did last week.   Review of Systems A 10 point review of systems was performed and it is as noted above otherwise  negative.  Patient Active Problem List   Diagnosis Date Noted   B12 deficiency 09/02/2020   Anemia 08/06/2020   Lower extremity edema 03/06/2018   TMJ (temporomandibular joint syndrome) 03/02/2018   RA (rheumatoid arthritis) ([place]) 07/13/2017   Polyarthralgia 04/25/2017   ILD (interstitial lung disease) ([place]) 06/18/2016   IPF (idiopathic pulmonary fibrosis) ([place]) 03/23/2016   Bronchiectasis without complication ([place]) 25/42/7062   Allergic rhinitis 03/22/2016   Anal fissure 08/18/2015   Insomnia 08/18/2015   Osteoporosis 02/13/2015   Stress due to illness of family member 09/16/2014   Hiatal hernia 10/13/2012   GERD (gastroesophageal reflux disease)    Allergy    Social History   Tobacco Use   Smoking status: Former    Packs/day: 0.25    Years: 20.00    Pack years: 5.00    Types: Cigarettes    Quit date: 01/16/1981    Years since quitting: 40.2   Smokeless tobacco: Never  Substance Use Topics   Alcohol use: Yes    Comment: 2 drinks daily    No Known Allergies  Current Meds  Medication Sig   amoxicillin-clavulanate (AUGMENTIN) 875-125 MG tablet Take 1 tablet by mouth 2 (two) times daily for 3 days.   azaTHIOprine (IMURAN) 50 MG tablet Take 75 mg every morning and 50 mg every evening.   azithromycin (ZITHROMAX) 250 MG tablet Take 1 tablet (250 mg total) by mouth 3 (three) times a week.   denosumab (PROLIA) 60 MG/ML SOLN injection Inject 60 mg into the skin every 6 (six) months. Administer in upper  arm, thigh, or abdomen   gabapentin (NEURONTIN) 100 MG capsule Take 1 capsule (100 mg total) by mouth at bedtime.   Multiple Minerals-Vitamins (CALCIUM & VIT D3 BONE HEALTH PO) Take 1 Dose by mouth daily.   pantoprazole (PROTONIX) 20 MG tablet Take 1 tablet (20 mg total) by mouth 2 (two) times daily. For heartburn.   predniSONE (DELTASONE) 1 MG tablet Take 2 tablets (2 mg total) by mouth daily with breakfast.   propranolol (INDERAL) 10 MG tablet Take 1 tablet (10 mg total) by  mouth 2 (two) times daily. For tremor.   traZODone (DESYREL) 50 MG tablet TAKE 1 TABLET AT BEDTIME FOR SLEEP   vitamin B-12 (CYANOCOBALAMIN) 500 MCG tablet Take 500 mcg by mouth daily.   [DISCONTINUED] amoxicillin-clavulanate (AUGMENTIN) 875-125 MG tablet Take 1 tablet by mouth 2 (two) times daily for 7 days.   Immunization History  Administered Date(s) Administered   Fluad Quad(high Dose 65+) 07/24/2019   Influenza Split 06/04/2012   Influenza, High Dose Seasonal PF 09/10/2016   Influenza,inj,Quad PF,6+ Mos 07/04/2013, 08/18/2015, 07/13/2017, 06/19/2018   Influenza-Unspecified 07/15/2020   Moderna SARS-COV2 Booster Vaccination 05/19/2020, 02/19/2021   Moderna Sars-Covid-2 Vaccination 10/22/2019, 11/19/2019   Pneumococcal Conjugate-13 10/06/2011   Pneumococcal Polysaccharide-23 06/19/2018   Zoster, Live 01/14/2007       Objective:   Physical Exam BP 124/60 (BP Location: Left Arm, Cuff Size: Normal)   Pulse 79   Temp 97.7 F (36.5 C) (Temporal)   Ht 5\' 7"  (1.702 m)   Wt 113 lb (51.3 kg)   SpO2 100%   BMI 17.70 kg/m  GENERAL: Thin well-developed woman in no acute distress.  Fully ambulatory.  She is pale HEAD: Normocephalic, atraumatic. EYES: Pupils equal, round, reactive to light.  No scleral icterus. MOUTH: Nose/mouth/throat not examined due to masking requirements for COVID 19. NECK: Supple. No thyromegaly. Trachea midline. No JVD.  No adenopathy. PULMONARY: Good air entry bilaterally.  Coarse breath sounds at the bases, faint dry crackles right greater than left. CARDIOVASCULAR: S1 and S2. Regular rate and rhythm.  No rubs, murmurs or gallops heard.   ABDOMEN: Nondistended, calf void. MUSCULOSKELETAL: No joint deformity, no clubbing, no edema. NEUROLOGIC: No overt focal deficit, no gait disturbance, speech is fluent. SKIN: Intact,warm,dry. PSYCH: Mood and behavior are normal.   Ambulatory oximetry today: No oxygen desaturations.  Saturations between 98 to 99% throughout  the whole study.  Patient tolerated well did complain of dyspnea during the study but not much over baseline.   Representative images from CT scan performed 24 March 2021:        Assessment & Plan:     ICD-10-CM   1. ILD (interstitial lung disease) ([place])  J84.9 Pulmonary Function Test [facility] Only    2. Aspiration into airway, initial encounter  T17.908A Ambulatory referral to Gastroenterology    Scleroderma Diagnostic Profile   Continue Augmentin for 3 more days Referral to GI for esophageal dysmotility issues Suspect chronic silent aspiration    3. Cough  R05.9 Pulmonary Function Test [facility] Only    CT CHEST WO CONTRAST    Scleroderma Diagnostic Profile   Likely related to recurrent aspiration Improving with antibiotics    4. Esophageal dysmotility  K22.4 Ambulatory referral to Gastroenterology    Scleroderma Diagnostic Profile    CANCELED: Anti-scleroderma antibody   Patulous esophagus noted on CT chest Has history of connective tissue disease Check scleroderma panel    5. Esophageal abnormality  K22.9 Scleroderma Diagnostic Profile   Patulous esophagus  6. Interstitial pulmonary disease ([facility])  J84.9 CT CHEST WO CONTRAST   Worsening pattern due to associated aspiration Repeat CT in 3 months     Meds ordered this encounter  Medications   amoxicillin-clavulanate (AUGMENTIN) 875-125 MG tablet    Sig: Take 1 tablet by mouth 2 (two) times daily for 3 days.    Dispense:  6 tablet    Refill:  0   Orders Placed This Encounter  Procedures   CT CHEST WO CONTRAST    Standing Status:   Future    Standing Expiration Date:   03/30/2022    Scheduling Instructions:     52mo    Order Specific Question:   Preferred imaging location?    Answer:   [facility]   Scleroderma Diagnostic Profile    Standing Status:   Future    Number of Occurrences:   1    Standing Expiration Date:   03/30/2022   Ambulatory referral to Gastroenterology    Referral Priority:   Routine     Referral Type:   Consultation    Referral Reason:   Specialty Services Required    Number of Visits Requested:   1   Pulmonary Function Test [facility] Only    Standing Status:   Future    Standing Expiration Date:   03/30/2022    Scheduling Instructions:     37mo    Order Specific Question:   Full PFT: includes the following: basic spirometry, spirometry pre & post bronchodilator, diffusion capacity (DLCO), lung volumes    Answer:   Full PFT   Discussion:  Patient has issues with esophageal dysmotility/patulous esophagus.  She is now exhibiting significant issues with aspiration.  This is likely have been an issue all along.  Recall that IPF/IUP is believed to be related to chronic silent aspiration.  Patient has underlying history of rheumatoid arthritis query associated connective tissue disease.  Will obtain scleroderma diagnostic panel.  It does not appear she has had this checked previously.  We will extend her Augmentin therapy for 3 more days.  She is feeling markedly better with this.  We will refer her to GI.  She prefers to go to [place].  Recommend Dr. Silvano Rusk.  She has previously a follow-up with me on 19 July and I have encouraged her to keep that appointment.  Renold Don, MD [facility]   *This note was dictated using voice recognition software/Dragon.  Despite best efforts to proofread, errors can occur which can change the meaning.  Any change was purely unintentional.

## 2021-04-02 LAB — SCLERODERMA DIAGNOSTIC PROFILE
Anti Nuclear Antibody (ANA): NEGATIVE
Scleroderma (Scl-70) (ENA) Antibody, IgG: <0.2 AI (ref 0.0–0.9)

## 2021-04-14 ENCOUNTER — Encounter: Payer: Self-pay | Admitting: Oncology

## 2021-04-16 DIAGNOSIS — H26491 Other secondary cataract, right eye: Secondary | ICD-10-CM | POA: Diagnosis not present

## 2021-04-21 ENCOUNTER — Encounter: Payer: Self-pay | Admitting: Pulmonary Disease

## 2021-04-21 ENCOUNTER — Encounter: Payer: Self-pay | Admitting: Oncology

## 2021-04-21 ENCOUNTER — Ambulatory Visit (INDEPENDENT_AMBULATORY_CARE_PROVIDER_SITE_OTHER): Payer: Medicare Other | Admitting: Pulmonary Disease

## 2021-04-21 ENCOUNTER — Other Ambulatory Visit: Payer: Self-pay

## 2021-04-21 VITALS — BP 98/60 | HR 71 | Temp 97.7°F | Ht 68.0 in | Wt 111.2 lb

## 2021-04-21 DIAGNOSIS — K224 Dyskinesia of esophagus: Secondary | ICD-10-CM | POA: Diagnosis not present

## 2021-04-21 DIAGNOSIS — T17908D Unspecified foreign body in respiratory tract, part unspecified causing other injury, subsequent encounter: Secondary | ICD-10-CM

## 2021-04-21 DIAGNOSIS — R1319 Other dysphagia: Secondary | ICD-10-CM | POA: Diagnosis not present

## 2021-04-21 DIAGNOSIS — R059 Cough, unspecified: Secondary | ICD-10-CM | POA: Diagnosis not present

## 2021-04-21 DIAGNOSIS — J849 Interstitial pulmonary disease, unspecified: Secondary | ICD-10-CM | POA: Diagnosis not present

## 2021-04-21 NOTE — Progress Notes (Signed)
Subjective:    Patient ID: Crystal Gregory, female    DOB: [birth year], 87 y.o.   MRN: [id number] Chief Complaint  Patient presents with   Follow-up    Sob, coughing.    PROBLEMS: History of prior ARDS 1999 Prior long term nitrofurantoin therapy Pulmonary fibrosis Bronchiectasis Mild COPD Rheumatoid arthritis (RF/CCP positive) Dysphagia with chronic silent aspiration   PT PROFILE: 87 y.o. with remote minimal smoking history (15 PY, quit 1982) initially evaluated by Dr Stevenson Clinch 03/2016 and subsequently by Dr Chase Caller for pulmonary fibrosis. Pt has prior history of severe ARDS (1999) and has been on chronic nitrofurantoin for recurrent UTIs. She has been tried on 2 maintenance inhalers without discernible benefit.   DATA: CT chest 03/22/16: Generalized pulmonary hyperinflation but without bullous emphysema. Widespread bronchiectasis with bronchial wall thickening consistent with inflammatory bronchitis. Areas of pulmonary fibrosis, most pronounced in the lower lobes, where the bronchiectasis is more extensive, including areas of pulmonary lung destruction HRCT chest 06/11/16: Diffuse cylindrical and varicoid bronchiectasis throughout both lungs, most severe at the lung bases. Basilar predominant fibrotic interstitial lung disease with patchy honeycombing, with mild progression in the short interval since 03/22/2016 Spirometry 06/18/16: Mild obstruction, FEV1 1.42 liters (63%), FVC 2.43 (80%) Bronchoscopy 07/05/16: Normal airway exam. BAL negative for AFB Echocardiogram 10/07/15: LVEF 60%, Grade I DD, mild MR, RVSP estimate 35 mmHg HRCT 11/04/16: again compatible with interstitial lung disease, and considered diagnostic of usual interstitial pneumonia (UIP) from an imaging standpoint. There has been no significant progression of disease compared to the recent prior examination PFT 02/10/17: no obstruction, normal TLC, moderate reduction in DLCO 6MWT 03/11/17: 345 m. No desaturation PFTs  11/22/17: No obstruction.  Lung volumes normal.  DLCO moderately reduced (55% predicted). [place] since 02/10/17 PFTs 11/23/18 : FVC: 2.67 L (95 %pred), FEV1: 2.35 L (112 %pred), FEV1/FVC: 88%, TLC: 4.98 L (88 %pred), DLCO 42 %pred 2D echo 11/16/2019:Left ventricular ejection fraction, by estimation, is 60 to 65%. Left ventricular diastolic parameters were normal.Right ventricular systolic function is normal. The right ventricular size is normal. Mildly elevated pulmonary artery systolic pressure. The estimated right ventricular systolic pressure is 91.6 mmHg. Left atrial size was mildly dilated.Tricuspid valve regurgitation is mild to moderate CT chest 02/15/2020:The appearance of the lungs is compatible with interstitial lung disease, with a spectrum of findings considered diagnostic of usual interstitial pneumonia (UIP) per current ATS guidelines. Minimal progression compared to the prior examination. Barium swallow study 03/05/2020: Tertiary contractions of the esophagus mild spasm, one episode of tracheal aspiration noted without cough reflex, mild GERD. Overnight pulse oximetry 08/18/2020: No evidence of significant nocturnal desaturation. PFTs 08/31/2020: FEV1 2.16 L or 102% predicted, FVC 2.30 L or 80% predicted, FEV1 FVC 94% predicted, no bronchodilator response.  TLC 4.46 L or 78% predicted, DLCO 37% predicted. QuantiFERON gold 03/12/2021: Negative. CT high-res 03/24/2021: Progressively worsening interstitial lung disease likely UIP.  Markedly worsening bronchiectasis in the right lower lobe with acute airspace consolidation.  Pneumonia considered.  Trace partially loculated right pleural effusion. PFTs 03/25/2021: FEV1 1.38 L or 66% predicted, FVC 1.90 L or 67% predicted FEV1/FVC 73% no bronchodilator response.  Air-trapping noted.  Overall worsening on FEV1 from prior likely related to acute illness diffusion capacity severely reduced. Scleroderma panel 04/01/2021: Negative  INTERVAL: Last  encounter was 30 March 2021, at that time she was being treated for aspiration pneumonia after CT high-res showed chronic interstitial changes with superimposed infiltrate classic for aspiration and small pleural effusion.  Treated with Augmentin.  She  is markedly improved.  Referred to GI due to potential esophageal dysmotility, no appointment yet.  HPI Crystal Gregory is an 87 year old with a very complex history as noted below.  She has had issues with pulmonary fibrosis that are multifactorial as noted.  This is a scheduled visit.  Recall that she was evaluated on 8 June and that she had increasing symptoms of shortness of breath as well as cough that was worsening at nighttime.  A high-resolution CT scan of the chest and PFTs were obtained.  CT chest was performed on 21 June and PFTs performed on 22 June.  CT of 22 June was consistent with aspiration she was prescribed Augmentin for aspiration pneumonia after evaluation of her CT scan of the chest.  CT scan of the chest shows that she has a very patulous esophagus which had air-fluid levels and it and retained food and suspect that she is having chronic aspiration.  This is indicative of potential esophageal dysmotility, scleroderma panel was performed and this was negative.  She has not been evaluated by GI as of yet though she has been referred.  Since her last visit she is back to her baseline.  Notes that her cough is markedly better.  She continues to have issues with her she has cough particularly at nighttime when she lays down.  She has not had any fevers, chills or sweats since her last visit.  She does note some issues with dysphagia particularly when eating crackers or flaky type foods.  She states that this can lead to a severe coughing spell and inability to clear her airway.  As noted she has a GI evaluation upcoming.  She does not endorse any other symptomatology.   Review of Systems A 10 point review of systems was performed and it is as noted  above otherwise negative.  Patient Active Problem List   Diagnosis Date Noted   B12 deficiency 09/02/2020   Anemia 08/06/2020   Lower extremity edema 03/06/2018   TMJ (temporomandibular joint syndrome) 03/02/2018   RA (rheumatoid arthritis) ([place]) 07/13/2017   Polyarthralgia 04/25/2017   ILD (interstitial lung disease) ([place]) 06/18/2016   IPF (idiopathic pulmonary fibrosis) ([place]) 03/23/2016   Bronchiectasis without complication ([place]) 74/94/4967   Allergic rhinitis 03/22/2016   Anal fissure 08/18/2015   Insomnia 08/18/2015   Osteoporosis 02/13/2015   Stress due to illness of family member 09/16/2014   Hiatal hernia 10/13/2012   GERD (gastroesophageal reflux disease)    Allergy    Social History   Tobacco Use   Smoking status: Former    Packs/day: 0.25    Years: 20.00    Pack years: 5.00    Types: Cigarettes    Quit date: 01/16/1981    Years since quitting: 40.2   Smokeless tobacco: Never  Substance Use Topics   Alcohol use: Yes    Comment: 2 drinks daily    No Known Allergies Current Meds  Medication Sig   amoxicillin-clavulanate (AUGMENTIN) 875-125 MG tablet    azaTHIOprine (IMURAN) 50 MG tablet Take 75 mg every morning and 50 mg every evening.   azithromycin (ZITHROMAX) 250 MG tablet Take 1 tablet (250 mg total) by mouth 3 (three) times a week.   denosumab (PROLIA) 60 MG/ML SOLN injection Inject 60 mg into the skin every 6 (six) months. Administer in upper arm, thigh, or abdomen   gabapentin (NEURONTIN) 100 MG capsule Take 1 capsule (100 mg total) by mouth at bedtime.   Multiple Minerals-Vitamins (CALCIUM & VIT D3 BONE HEALTH  PO) Take 1 Dose by mouth daily.   pantoprazole (PROTONIX) 20 MG tablet Take 1 tablet (20 mg total) by mouth 2 (two) times daily. For heartburn.   predniSONE (DELTASONE) 1 MG tablet Take 2 tablets (2 mg total) by mouth daily with breakfast.   propranolol (INDERAL) 10 MG tablet Take 1 tablet (10 mg total) by mouth 2 (two) times daily. For tremor.    traZODone (DESYREL) 50 MG tablet TAKE 1 TABLET AT BEDTIME FOR SLEEP   vitamin B-12 (CYANOCOBALAMIN) 500 MCG tablet Take 500 mcg by mouth daily.   Immunization History  Administered Date(s) Administered   Fluad Quad(high Dose 65+) 07/24/2019   Influenza Split 06/04/2012   Influenza, High Dose Seasonal PF 09/10/2016   Influenza,inj,Quad PF,6+ Mos 07/04/2013, 08/18/2015, 07/13/2017, 06/19/2018   Influenza-Unspecified 07/15/2020   Moderna SARS-COV2 Booster Vaccination 05/19/2020, 02/19/2021   Moderna Sars-Covid-2 Vaccination 10/22/2019, 11/19/2019   Pneumococcal Conjugate-13 10/06/2011   Pneumococcal Polysaccharide-23 06/19/2018   Zoster, Live 01/14/2007       Objective:   Physical Exam BP 98/60 (BP Location: Left Arm, Patient Position: Sitting, Cuff Size: Normal)   Pulse 71   Temp 97.7 F (36.5 C) (Temporal)   Ht 5\' 8"  (1.727 m)   Wt 111 lb 3.2 oz (50.4 kg)   SpO2 98%   BMI 16.91 kg/m   GENERAL: Thin well-developed woman in no acute distress.  Fully ambulatory.  She is pale HEAD: Normocephalic, atraumatic. EYES: Pupils equal, round, reactive to light.  No scleral icterus. MOUTH: Nose/mouth/throat not examined due to masking requirements for COVID 19. NECK: Supple. No thyromegaly. Trachea midline. No JVD.  No adenopathy. PULMONARY: Good air entry bilaterally.  Coarse breath sounds at the bases, faint dry crackles right greater than left. CARDIOVASCULAR: S1 and S2. Regular rate and rhythm.  No rubs, murmurs or gallops heard.   ABDOMEN: Nondistended, calf void. MUSCULOSKELETAL: No joint deformity, no clubbing, no edema. NEUROLOGIC: No overt focal deficit, no gait disturbance, speech is fluent. SKIN: Intact,warm,dry. PSYCH: Mood and behavior are normal.    Assessment & Plan:     ICD-10-CM   1. ILD (interstitial lung disease) ([place])  J84.9    Multifactorial in etiology Recent worsening associated with aspiration Symptomatology back to baseline    2. Aspiration into airway,  subsequent encounter  T17.908D    Repeat CT chest in 2 weeks     3. Cough  R05.9    Improved after treatment with Augmentin Nocturnal issues with cough likely related to chronic silent aspiration    4. Esophageal dysmotility  K22.4    Has been referred to GI Patient prefers [place]    5. Other dysphagia  R13.19    Issues with dysphagia particularly with crackers and flaky foods     She is to have a repeat chest CT in 2 weeks time.  This has been previously scheduled.  We will also repeat pulmonary function testing at a later date.  We will see her in follow-up in 4 to 6 weeks time she is to contact us prior to that time should any new difficulties arise.  Hopefully by that time she should have had her GI evaluation.  Renold Don, MD [facility]   *This note was dictated using voice recognition software/Dragon.  Despite best efforts to proofread, errors can occur which can change the meaning.  Any change was purely unintentional.

## 2021-04-21 NOTE — Patient Instructions (Addendum)
We are going to get a CT chest (regular) and 2 more weeks to make sure your pneumonia cleared.  We will see you in follow-up in 4 to 6 weeks time, call sooner should any new problems arise  We are going to check to see what is happening with gastroenterology referral in [place].

## 2021-04-23 ENCOUNTER — Telehealth: Payer: Self-pay

## 2021-04-23 NOTE — Telephone Encounter (Signed)
Benefits submitted-pending Next injection due after 05/20/21

## 2021-04-29 ENCOUNTER — Encounter: Payer: Self-pay | Admitting: *Deleted

## 2021-04-30 NOTE — Telephone Encounter (Signed)
Noted. Patient scheduled for 05/21/21 for nurse visit

## 2021-04-30 NOTE — Telephone Encounter (Signed)
Benefits received. OOP cost is $0. Spoke with Allie Bossier, NP and discussed it is ok to use lab results on 03/06/21. Calcium was normal 9.3. CrCl  46.37 mL/min. Is it ok to proceed with injection?  Have not made nurse visit for this yet. Thank you

## 2021-04-30 NOTE — Telephone Encounter (Signed)
According to up to date, creatinine clearance >30, no adjustment is needed. Okay to proceed.

## 2021-05-05 ENCOUNTER — Other Ambulatory Visit: Payer: Self-pay

## 2021-05-05 ENCOUNTER — Ambulatory Visit
Admission: RE | Admit: 2021-05-05 | Discharge: 2021-05-05 | Disposition: A | Discharge status: Home / Self Care | Payer: Medicare Other | Source: Ambulatory Visit | Attending: Pulmonary Disease | Admitting: Pulmonary Disease

## 2021-05-05 DIAGNOSIS — J479 Bronchiectasis, uncomplicated: Secondary | ICD-10-CM | POA: Diagnosis not present

## 2021-05-05 DIAGNOSIS — J849 Interstitial pulmonary disease, unspecified: Secondary | ICD-10-CM | POA: Diagnosis not present

## 2021-05-05 DIAGNOSIS — I700 Atherosclerosis of aorta: Secondary | ICD-10-CM | POA: Diagnosis not present

## 2021-05-05 DIAGNOSIS — R059 Cough, unspecified: Secondary | ICD-10-CM | POA: Diagnosis not present

## 2021-05-05 DIAGNOSIS — J90 Pleural effusion, not elsewhere classified: Secondary | ICD-10-CM | POA: Diagnosis not present

## 2021-05-14 ENCOUNTER — Encounter: Payer: Self-pay | Admitting: Rheumatology

## 2021-05-15 MED ORDER — AZATHIOPRINE 50 MG PO TABS: ORAL_TABLET | ORAL | 0 refills | Status: DC

## 2021-05-15 NOTE — Telephone Encounter (Signed)
Next Visit: 06/12/2021  Last Visit: 03/06/2021   DX: Seropositive rheumatoid arthritis   Current Dose per office note 03/06/2021:  Imuran 50 mg p.o. every morning and 75 mg p.o. every afternoon since 2019.  Patient states she is taking I take one and one-half tablets in the morning and one tablet in the evening  Labs: 03/06/2021 Anemia persists.  CMP is normal.  Sed rate is 92, rheumatoid factor and anti-CCP are positive.  Hepatitis panel is negative.  TB gold was negative.  Vitamin D is normal.  TPMT is pending.  Okay to refill Imuran?

## 2021-05-21 ENCOUNTER — Other Ambulatory Visit: Payer: Self-pay

## 2021-05-21 ENCOUNTER — Ambulatory Visit (INDEPENDENT_AMBULATORY_CARE_PROVIDER_SITE_OTHER): Payer: Medicare Other

## 2021-05-21 DIAGNOSIS — M810 Age-related osteoporosis without current pathological fracture: Secondary | ICD-10-CM | POA: Diagnosis not present

## 2021-05-21 MED ORDER — DENOSUMAB 60 MG/ML ~~LOC~~ SOSY
60.0000 mg | PREFILLED_SYRINGE | Freq: Once | SUBCUTANEOUS | Status: AC
  Administered 2021-05-21: 60 mg via SUBCUTANEOUS

## 2021-05-21 NOTE — Progress Notes (Signed)
Per orders of Allie Bossier, NP, injection of Prolia given by Pilar Grammes, CMA in Left Arm.  Patient tolerated injection well.  Sending to Dr Einar Pheasant to sign-off in [place] absence.

## 2021-05-27 ENCOUNTER — Telehealth: Payer: Self-pay

## 2021-05-27 NOTE — Telephone Encounter (Signed)
Called and spoke with patients in regards to upcoming Covid test, patient had a full understanding nothing further needed.

## 2021-05-29 NOTE — Progress Notes (Signed)
Office Visit Note  Patient: Crystal Gregory             Date of Birth: 1932/04/05           MRN: [id number]             PCP: Crystal Koch, NP Referring: Crystal Koch, NP Visit Date: 06/12/2021 Occupation: '@GUAROCC'$ @  Subjective:  Increased SOB  History of Present Illness: Crystal Gregory is a 87 y.o. female with history of seropositive rheumatoid arthritis and ILD.  Patient is currently taking Imuran 75 mg in the morning and 50 mg in the evening.  She continues to tolerate Imuran without any side effects.  She remains on prednisone 2 mg daily.  She states that her rheumatoid arthritis remains well controlled on the current treatment regimen.  She states that she continues to noticed increased shortness of breath and intermittent coughing due to progression of pulmonary fibrosis.  She was evaluated by Dr. Patsey Gregory on 06/10/2021.  She had an updated chest CT on 05/05/2021.  She will be following up with cardiology soon for further evaluation of pulmonary hypertension.  She also has an upcoming appointment gastroenterology on 06/16/2021 for further evaluation and management of abnormal CT findings-patulous esophagus.   She receives Prolia 60 mg subcutaneous injections every 6 months ordered by her PCP.  Denies any recent falls or fractures.     Activities of Daily Living:  Patient reports morning stiffness for 0 minutes.   Patient Denies nocturnal pain.  Difficulty dressing/grooming: Denies Difficulty climbing stairs: Denies Difficulty getting out of chair: Denies Difficulty using hands for taps, buttons, cutlery, and/or writing: Reports  Review of Systems  Constitutional:  Positive for fatigue.  HENT:  Negative for mouth sores, mouth dryness and nose dryness.   Eyes:  Negative for pain, itching and dryness.  Respiratory:  Positive for shortness of breath and difficulty breathing.   Cardiovascular:  Negative for chest pain and palpitations.  Gastrointestinal:  Negative for blood in  stool, constipation and diarrhea.  Endocrine: Negative for increased urination.  Genitourinary:  Negative for difficulty urinating.  Musculoskeletal:  Negative for joint pain, joint pain, joint swelling, myalgias, morning stiffness, muscle tenderness and myalgias.  Skin:  Negative for color change, rash and redness.  Allergic/Immunologic: Negative for susceptible to infections.  Neurological:  Negative for dizziness, numbness, headaches, memory loss and weakness.  Hematological:  Negative for bruising/bleeding tendency.  Psychiatric/Behavioral:  Negative for confusion.    PMFS History:  Patient Active Problem List   Diagnosis Date Noted  . B12 deficiency 09/02/2020  . Anemia 08/06/2020  . Lower extremity edema 03/06/2018  . TMJ (temporomandibular joint syndrome) 03/02/2018  . RA (rheumatoid arthritis) ([place]) 07/13/2017  . Polyarthralgia 04/25/2017  . ILD (interstitial lung disease) ([place]) 06/18/2016  . IPF (idiopathic pulmonary fibrosis) ([place]) 03/23/2016  . Bronchiectasis without complication ([place]) [id number]  . Allergic rhinitis 03/22/2016  . Anal fissure 08/18/2015  . Insomnia 08/18/2015  . Osteoporosis 02/13/2015  . Stress due to illness of family member 09/16/2014  . Hiatal hernia 10/13/2012  . GERD (gastroesophageal reflux disease)   . Allergy     Past Medical History:  Diagnosis Date  . Allergy   . Anal fissure   . Aspiration into airway   . CAP (community acquired pneumonia) 08/23/2017  . Chest pain    a. 09/04/2018 MV: EF >65%. No ischemia/infarct.  . Coronary artery calcification seen on CT scan   . Diastolic dysfunction    a. 10/2016 Echo: EF  60-65%, no rwma, Gr1 DD, mild MR, nl RV fxn, mild to mod TR, PASP 97mHg.  . Diverticulosis   . Dyspnea   . GERD (gastroesophageal reflux disease)   . Hemorrhoids   . Hyperplastic colon polyp   . Interstitial lung disorders ([place]   . Pulmonary fibrosis ([place]   . Septicemia ([place] 1999   spent 4 weeks in ICU, intubated -?  PNA    Family History  Problem Relation Age of Onset  . Parkinson's disease Mother   . Colon polyps Mother 845         . Ulcers Father   . Rheumatic fever Father   . Macular degeneration Sister   . Healthy Son   . Healthy Daughter    Past Surgical History:  Procedure Laterality Date  . ABDOMINAL HYSTERECTOMY    . FLEXIBLE BRONCHOSCOPY N/A 07/05/2016   Procedure: FLEXIBLE BRONCHOSCOPY;  Surgeon: VVilinda Boehringer MD;  Location: [facility] ORS;  Service: Cardiopulmonary;  Laterality: N/A;   Social History   Social History Narrative   Married.     2 children.  [place]   Immunization History  Administered Date(s) Administered  . Fluad Quad(high Dose 65+) 07/24/2019  . Influenza Split 06/04/2012  . Influenza, High Dose Seasonal PF 09/10/2016  . Influenza,inj,Quad PF,6+ Mos 07/04/2013, 08/18/2015, 07/13/2017, 06/19/2018  . Influenza-Unspecified 07/15/2020  . Moderna SARS-COV2 Booster Vaccination 05/19/2020, 02/19/2021  . Moderna Sars-Covid-2 Vaccination 10/22/2019, 11/19/2019  . Pneumococcal Conjugate-13 10/06/2011  . Pneumococcal Polysaccharide-23 06/19/2018  . Zoster, Live 01/14/2007     Objective: Vital Signs: BP 117/71 (BP Location: Left Arm, Patient Position: Sitting, Cuff Size: Normal)   Pulse 86   Ht '5\' 8"'$  (1.727 m)   Wt 112 lb 9.6 oz (51.1 kg)   BMI 17.12 kg/m    Physical Exam Vitals and nursing note reviewed.  Constitutional:      Appearance: She is well-developed.  HENT:     Head: Normocephalic and atraumatic.  Eyes:     Conjunctiva/sclera: Conjunctivae normal.  Cardiovascular:     Rate and Rhythm: Normal rate and regular rhythm.     Pulses: Normal pulses.     Heart sounds: Normal heart sounds.  Pulmonary:     Effort: Pulmonary effort is normal.     Comments: Faint crackles at lung bases, R>L. Abdominal:     Palpations: Abdomen is soft.  Musculoskeletal:     Cervical back: Normal range of motion.  Lymphadenopathy:      Cervical: No cervical adenopathy.  Skin:    General: Skin is warm and dry.     Capillary Refill: Capillary refill takes less than 2 seconds.  Neurological:     Mental Status: She is alert and oriented to person, place, and time.  Psychiatric:        Behavior: Behavior normal.     Musculoskeletal Exam: C-spine, thoracic spine, lumbar spine have good range of motion with no discomfort.  Shoulder joints and elbow joints have good ROM. Limited ROM of both wrist joints.  Thickening over MCP joints but no tenderness or synovitis.  Complete fist formation bilaterally.  Hip joints have good range of motion with no discomfort.  Knee joints have good range of motion with no warmth or effusion.  Ankle joints have good range of motion with no tenderness or joint swelling.  CDAI Exam: CDAI Score: -- Patient Global: --; Provider Global: -- Swollen: --; Tender: -- Joint Exam 06/12/2021   No joint exam has  been documented for this visit   There is currently no information documented on the homunculus. Go to the Rheumatology activity and complete the homunculus joint exam.  Investigation: No additional findings.  Imaging: Pulmonary Function Test [facility] Only  Result Date: 06/02/2021 Spirometry Data Is Acceptable and Reproducible Mild Obstructive Airways Disease without  Significant Broncho-Dilator Response Consider outpatient Pulmonary Consultation if needed Clinical Correlation Advised    Recent Labs: Lab Results  Component Value Date   WBC 5.1 03/06/2021   HGB 10.0 (L) 03/06/2021   PLT 375 03/06/2021   NA 137 03/06/2021   K 4.6 03/06/2021   CL 101 03/06/2021   CO2 27 03/06/2021   GLUCOSE 94 03/06/2021   BUN 19 03/06/2021   CREATININE 0.68 03/06/2021   BILITOT 0.7 03/06/2021   ALKPHOS 39 12/30/2020   AST 15 03/06/2021   ALT 7 03/06/2021   PROT 7.1 03/06/2021   ALBUMIN 3.9 12/30/2020   CALCIUM 9.3 03/06/2021   GFRAA 91 03/06/2021   QFTBGOLDPLUS NEGATIVE 03/06/2021    Speciality  Comments: Plaquenil and leflunomide-inadequate response Imuran 125 mg p.o. daily started May 2019.  We discussed switching to CellCept which she declined.  Procedures:  No procedures performed Allergies: Patient has no known allergies.   Assessment / Plan:     Visit Diagnoses: Seropositive rheumatoid arthritis ([place]) - dxd 2018 by Dr. Meda Coffee.  On Imuran since 2019. RF+, anti-CCP+: She has no tenderness or synovitis on examination today.  Her rheumatoid arthritis has been well controlled on Imuran 75 mg every morning and 50 mg in the evening.  She remains on prednisone 2 mg daily.  She is not experiencing any increased joint pain and has no inflammation on examination today.  She has not had any morning stiffness, nocturnal pain, or difficulty with ADLs.  She will remain on the current treatment regimen.  She was advised to notify us if she develops increased joint pain or joint swelling.  She will follow-up in the office in 5 months.  High risk medication use - Imuran 75 mg every morning and 50 mg in the evening since 2019.  She remains on Prednisone 2 mg p.o. daily.  Previously had an adequate response to Plaquenil and Arava.  Declined CellCept in the past.  CBC and CMP were drawn on 03/06/2021.  She is due to update lab work.  Orders for CBC and CMP were released.  Her next lab work will be due in December and every 3 months to monitor for drug toxicity.  Standing orders for CBC and CMP remain in place.- Plan: CBC with Differential/Platelet, COMPLETE METABOLIC PANEL WITH GFR Discussed the importance of holding Imuran if she develops signs or symptoms of an infection and to resume once infection has completely cleared.  ILD (interstitial lung disease) ([place]) - Followed by Dr. Patsey Gregory.She had an appointment with Dr. Patsey Gregory on 06/10/2021.  Her most recent chest CT was on 05/05/2021: No significant change and moderate pulmonary fibrosis and pattern with apical to basal gradient, featuring irregular peripheral  interstitial opacity, septal thickening, traction bronchiectasis, subpleural bronchiolectasis, and extensive areas of honeycombing at the lung bases. She remains on Imuran 75 mg in the morning and 50 mg in the evening.  She has also been taking prednisone 2 mg daily.  Declined use of cellcept in the past. She continues to have ongoing shortness of breath and coughing related to bronchiectasis/ILD.  In the past she went to pulmonary rehab for 36 sessions with minimal improvement in her symptoms. She  will be following up with cardiology for further evaluation of pulmonary hypertension.  Scheduled to update echocardiogram.  She has an upcoming appointment with gastroenterology on 06/16/2021 for further evaluation of patulous esophagus noted on CT. Retained food particles were noted with prior evidence of aspiration. She remains on protonix as prescribed.   IPF (idiopathic pulmonary fibrosis) ([facility]) - History of ARDS from nitrofurantoin.   Bronchiectasis without complication ([facility])   TMJ (temporomandibular joint syndrome) - Left TMJ discomfort.  Resolved.  Age-related osteoporosis without current pathological fracture - January 22, 2020 DEXA the BMD measured at Forearm Radius 33% is 0.604 g/cm2 with a T-score-3.1.  She is on Prolia by her PCP.  She continues to take a calcium and vitamin D supplement on a daily basis.  History of gastroesophageal reflux (GERD) - History of esophageal motility.  She is on PPIs.  She has an upcoming appointment with GI on 07/03/2021.  Hiatal hernia  B12 deficiency  Orders: Orders Placed This Encounter  Procedures  . CBC with Differential/Platelet  . COMPLETE METABOLIC PANEL WITH GFR   No orders of the defined types were placed in this encounter.     Follow-Up Instructions: Return in about 3 months (around 09/11/2021) for Rheumatoid arthritis, ILD.   Ofilia Neas, PA-C  Note - This record has been created using Dragon software.  Chart creation errors have been  sought, but may not always  have been located. Such creation errors do not reflect on  the standard of medical care.

## 2021-06-01 ENCOUNTER — Other Ambulatory Visit: Payer: Self-pay

## 2021-06-01 ENCOUNTER — Other Ambulatory Visit
Admission: RE | Admit: 2021-06-01 | Discharge: 2021-06-01 | Disposition: A | Discharge status: Home / Self Care | Payer: Medicare Other | Source: Ambulatory Visit | Attending: Pulmonary Disease | Admitting: Pulmonary Disease

## 2021-06-01 DIAGNOSIS — Z01812 Encounter for preprocedural laboratory examination: Secondary | ICD-10-CM | POA: Insufficient documentation

## 2021-06-01 DIAGNOSIS — Z20822 Contact with and (suspected) exposure to covid-19: Secondary | ICD-10-CM | POA: Diagnosis not present

## 2021-06-01 LAB — SARS CORONAVIRUS 2 (TAT 6-24 HRS): SARS Coronavirus 2: NEGATIVE

## 2021-06-02 ENCOUNTER — Ambulatory Visit: Payer: Medicare Other | Attending: Pulmonary Disease

## 2021-06-02 DIAGNOSIS — J849 Interstitial pulmonary disease, unspecified: Secondary | ICD-10-CM

## 2021-06-02 DIAGNOSIS — R059 Cough, unspecified: Secondary | ICD-10-CM

## 2021-06-02 MED ORDER — ALBUTEROL SULFATE (2.5 MG/3ML) 0.083% IN NEBU
2.5000 mg | INHALATION_SOLUTION | Freq: Once | RESPIRATORY_TRACT | Status: AC
  Administered 2021-06-02: 2.5 mg via RESPIRATORY_TRACT
  Filled 2021-06-02: qty 3

## 2021-06-10 ENCOUNTER — Other Ambulatory Visit: Payer: Self-pay

## 2021-06-10 ENCOUNTER — Ambulatory Visit (INDEPENDENT_AMBULATORY_CARE_PROVIDER_SITE_OTHER): Payer: Medicare Other | Admitting: Pulmonary Disease

## 2021-06-10 ENCOUNTER — Encounter: Payer: Self-pay | Admitting: Pulmonary Disease

## 2021-06-10 VITALS — BP 102/60 | HR 82 | Temp 97.6°F | Ht 68.0 in | Wt 111.8 lb

## 2021-06-10 DIAGNOSIS — K224 Dyskinesia of esophagus: Secondary | ICD-10-CM | POA: Diagnosis not present

## 2021-06-10 DIAGNOSIS — R059 Cough, unspecified: Secondary | ICD-10-CM | POA: Diagnosis not present

## 2021-06-10 DIAGNOSIS — R0602 Shortness of breath: Secondary | ICD-10-CM

## 2021-06-10 DIAGNOSIS — J479 Bronchiectasis, uncomplicated: Secondary | ICD-10-CM

## 2021-06-10 DIAGNOSIS — J849 Interstitial pulmonary disease, unspecified: Secondary | ICD-10-CM | POA: Diagnosis not present

## 2021-06-10 MED ORDER — PROAIR DIGIHALER 108 (90 BASE) MCG/ACT IN AEPB: 2.0000 | INHALATION_SPRAY | Freq: Four times a day (QID) | RESPIRATORY_TRACT | 0 refills | Status: DC

## 2021-06-10 NOTE — Patient Instructions (Signed)
We are providing you with a flutter valve that you can use twice a day to keep your lungs clear as well prevent those episodes where the mucus accumulates.  I will be on the look out for the evaluation from GI.  We are ordering echocardiogram to reevaluate your heart and your pulmonary hypertension.  You did well on your ambulatory oxygen level today it remained at 97%.  I have provided you with some albuterol (ProAir) you can use this 2 puffs every 4-6 hours as needed for cough or shortness of breath.  We will see you in follow-up in 4 to 6 weeks time call sooner should any new problems arise.

## 2021-06-10 NOTE — Progress Notes (Signed)
Subjective:    Patient ID: Crystal Gregory, female    DOB: 12-06-1931, 87 y.o.   MRN: [id number] Chief Complaint  Patient presents with   Follow-up    Sob with exertion, prod cough with clear to yellow sputum and wheezing.     PROBLEMS: History of prior ARDS 1999 Prior long term nitrofurantoin therapy Pulmonary fibrosis Bronchiectasis Mild COPD Rheumatoid arthritis (RF/CCP positive) Dysphagia with chronic silent aspiration   PT PROFILE: 87 y.o. with remote minimal smoking history (15 PY, quit 1982) initially evaluated by Dr Stevenson Clinch 03/2016 and subsequently by Dr Chase Caller for pulmonary fibrosis. Pt has prior history of severe ARDS (1999) and had been on chronic nitrofurantoin for recurrent UTIs. She has been tried on 2 maintenance inhalers without discernible benefit.   DATA: CT chest 03/22/16: Generalized pulmonary hyperinflation but without bullous emphysema. Widespread bronchiectasis with bronchial wall thickening consistent with inflammatory bronchitis. Areas of pulmonary fibrosis, most pronounced in the lower lobes, where the bronchiectasis is more extensive, including areas of pulmonary lung destruction HRCT chest 06/11/16: Diffuse cylindrical and varicoid bronchiectasis throughout both lungs, most severe at the lung bases. Basilar predominant fibrotic interstitial lung disease with patchy honeycombing, with mild progression in the short interval since 03/22/2016 Spirometry 06/18/16: Mild obstruction, FEV1 1.42 liters (63%), FVC 2.43 (80%) Bronchoscopy 07/05/16: Normal airway exam. BAL negative for AFB Echocardiogram 10/07/15: LVEF 60%, Grade I DD, mild MR, RVSP estimate 35 mmHg HRCT 11/04/16: again compatible with interstitial lung disease, and considered diagnostic of usual interstitial pneumonia (UIP) from an imaging standpoint. There has been no significant progression of disease compared to the recent prior examination PFT 02/10/17: no obstruction, normal TLC, moderate  reduction in DLCO 6MWT 03/11/17: 345 m. No desaturation PFTs 11/22/17: No obstruction.  Lung volumes normal.  DLCO moderately reduced (55% predicted). [place] since 02/10/17 PFTs 11/23/18 : FVC: 2.67 L (95 %pred), FEV1: 2.35 L (112 %pred), FEV1/FVC: 88%, TLC: 4.98 L (88 %pred), DLCO 42 %pred 2D echo 11/16/2019:Left ventricular ejection fraction, by estimation, is 60 to 65%. Left ventricular diastolic parameters were normal.Right ventricular systolic function is normal. The right ventricular size is normal. Mildly elevated pulmonary artery systolic pressure. The estimated right ventricular systolic pressure is [id number] mmHg. Left atrial size was mildly dilated.Tricuspid valve regurgitation is mild to moderate CT chest 02/15/2020:The appearance of the lungs is compatible with interstitial lung disease, with a spectrum of findings considered diagnostic of usual interstitial pneumonia (UIP) per current ATS guidelines. Minimal progression compared to the prior examination. Barium swallow study 03/05/2020: Tertiary contractions of the esophagus mild spasm, one episode of tracheal aspiration noted without cough reflex, mild GERD. Overnight pulse oximetry 08/18/2020: No evidence of significant nocturnal desaturation. PFTs 08/31/2020: FEV1 2.16 L or 102% predicted, FVC 2.30 L or 80% predicted, FEV1 FVC 94% predicted, no bronchodilator response.  TLC 4.46 L or 78% predicted, DLCO 37% predicted. QuantiFERON gold 03/12/2021: Negative. CT high-res 03/24/2021: Progressively worsening interstitial lung disease likely UIP.  Markedly worsening bronchiectasis in the right lower lobe with acute airspace consolidation.  Pneumonia considered.  Trace partially loculated right pleural effusion. PFTs 03/25/2021: FEV1 1.38 L or 66% predicted, FVC 1.90 L or 67% predicted FEV1/FVC 73% no bronchodilator response.  Air-trapping noted.  Overall worsening on FEV1 from prior likely related to acute illness diffusion capacity severely  reduced. Scleroderma panel 04/01/2021: Negative Chest CT without contrast 05/05/2021: No significant change in pulmonary fibrosis bronchiectasis and areas of honeycombing at the lung bases.  Are consistent with UIP.  The previously noted infiltrate  has resolved. PFTs 06/02/2021: FEV1 1.59 L or 76% predicted, FVC 2.08 L or 74% predicted, FEV1/FVC 76%, no bronchodilator response.  Lung volumes normal, improvement from prior PFTs noted.  Diffusion capacity severely reduced however improved from prior.  INTERVAL: Last encounter was 21 April 2021, at that time she had completed treatment for aspiration pneumonia after CT high-res showed chronic interstitial changes with superimposed infiltrate classic for aspiration and small pleural effusion.  Treated with Augmentin.  She has in the interim had repeat CT which showed resolution of the infiltrate but persistent significant fibrotic changes, she also had PFTs which showed improved lung function from the June 2022 study.    HPI Crystal Gregory is an 87 year old with a very complex history as noted below.  She has had issues with pulmonary fibrosis that are multifactorial as noted.  This is a scheduled visit.  Recall that she was evaluated on 8 June and that she had increasing symptoms of shortness of breath as well as cough that was worsening at nighttime.  A high-resolution CT scan of the chest and PFTs were obtained.  CT chest was performed on 21 June and PFTs performed on 22 June.  CT of 22 June was consistent with aspiration she was prescribed Augmentin for aspiration pneumonia after evaluation of her CT scan of the chest.  CT scan of the chest shows that she has a very patulous esophagus which had air-fluid levels and it and retained food and suspect that she is having chronic aspiration.  This is indicative of potential esophageal dysmotility, scleroderma panel was performed and this was negative.  She is to be evaluated by GI on 13 September.  Since her last visit  she is back to her baseline.  Notes that her cough is markedly better.  She continues to have issues with her she has cough particularly at nighttime when she lays down.  But notes that this is better since she has curtailed all p.o. intake from 7 PM on.  She does get bouts of cough with associated copious sputum secretion.  He currently is not using flutter valve though this has been provided for her previously.  She does have bronchiectasis.  She has not had any fevers, chills or sweats since her last visit.  She does note some issues with dysphagia particularly when eating crackers or flaky type foods.  She states that this can lead to a severe coughing spell and inability to clear her airway.  As noted she has a GI evaluation upcoming.   She has had issues with dyspnea on exertion however we have never been able to corroborate any oxygen desaturations with exercise.  She does have mild to moderate pulmonary hypertension.   Review of Systems A 10 point review of systems was performed and it is as noted above otherwise negative.  Patient Active Problem List   Diagnosis Date Noted   B12 deficiency 09/02/2020   Anemia 08/06/2020   Lower extremity edema 03/06/2018   TMJ (temporomandibular joint syndrome) 03/02/2018   RA (rheumatoid arthritis) ([place]) 07/13/2017   Polyarthralgia 04/25/2017   ILD (interstitial lung disease) ([place]) 06/18/2016   IPF (idiopathic pulmonary fibrosis) ([place]) 03/23/2016   Bronchiectasis without complication ([place]) [id number]   Allergic rhinitis 03/22/2016   Anal fissure 08/18/2015   Insomnia 08/18/2015   Osteoporosis 02/13/2015   Stress due to illness of family member 09/16/2014   Hiatal hernia 10/13/2012   GERD (gastroesophageal reflux disease)    Allergy    Social History   Tobacco  Use   Smoking status: Former    Packs/day: 0.25    Years: 20.00    Pack years: 5.00    Types: Cigarettes    Quit date: 01/16/1981    Years since quitting: 40.4   Smokeless tobacco:  Never  Substance Use Topics   Alcohol use: Yes    Comment: 2 drinks daily    No Known Allergies  Current Meds  Medication Sig   azaTHIOprine (IMURAN) 50 MG tablet Take 75 mg every morning and 50 mg every evening.   azithromycin (ZITHROMAX) 250 MG tablet Take 1 tablet (250 mg total) by mouth 3 (three) times a week.   denosumab (PROLIA) 60 MG/ML SOLN injection Inject 60 mg into the skin every 6 (six) months. Administer in upper arm, thigh, or abdomen   gabapentin (NEURONTIN) 100 MG capsule Take 1 capsule (100 mg total) by mouth at bedtime.   Multiple Minerals-Vitamins (CALCIUM & VIT D3 BONE HEALTH PO) Take 1 Dose by mouth daily.   pantoprazole (PROTONIX) 20 MG tablet Take 1 tablet (20 mg total) by mouth 2 (two) times daily. For heartburn.   predniSONE (DELTASONE) 1 MG tablet Take 2 tablets (2 mg total) by mouth daily with breakfast.   propranolol (INDERAL) 10 MG tablet Take 1 tablet (10 mg total) by mouth 2 (two) times daily. For tremor.   traZODone (DESYREL) 50 MG tablet TAKE 1 TABLET AT BEDTIME FOR SLEEP   vitamin B-12 (CYANOCOBALAMIN) 500 MCG tablet Take 500 mcg by mouth daily.   [DISCONTINUED] amoxicillin-clavulanate (AUGMENTIN) 875-125 MG tablet    Immunization History  Administered Date(s) Administered   Fluad Quad(high Dose 65+) 07/24/2019   Influenza Split 06/04/2012   Influenza, High Dose Seasonal PF 09/10/2016   Influenza,inj,Quad PF,6+ Mos 07/04/2013, 08/18/2015, 07/13/2017, 06/19/2018   Influenza-Unspecified 07/15/2020   Moderna SARS-COV2 Booster Vaccination 05/19/2020, 02/19/2021   Moderna Sars-Covid-2 Vaccination 10/22/2019, 11/19/2019   Pneumococcal Conjugate-13 10/06/2011   Pneumococcal Polysaccharide-23 06/19/2018   Zoster, Live 01/14/2007       Objective:   Physical Exam BP 102/60 (BP Location: Left Arm, Cuff Size: Normal)   Pulse 82   Temp 97.6 F (36.4 C) (Temporal)   Ht '5\' 8"'$  (1.727 m)   Wt 111 lb 12.8 oz (50.7 kg)   SpO2 97%   BMI 17.00 kg/m    GENERAL: Chronically ill-appearing, thin well-developed woman in no acute distress.  Fully ambulatory.  She is pale. HEAD: Normocephalic, atraumatic. EYES: Pupils equal, round, reactive to light.  No scleral icterus. MOUTH: Nose/mouth/throat not examined due to masking requirements for COVID 19. NECK: Supple. No thyromegaly. Trachea midline. No JVD.  No adenopathy. PULMONARY: Good air entry bilaterally.  Coarse breath sounds at the bases, faint dry crackles right greater than left. CARDIOVASCULAR: S1 and S2. Regular rate and rhythm.  No rubs, murmurs or gallops heard.   ABDOMEN: Nondistended, calf void. MUSCULOSKELETAL: No joint deformity, no clubbing, no edema. NEUROLOGIC: No overt focal deficit, no gait disturbance, speech is fluent. SKIN: Intact,warm,dry. PSYCH: Mood and behavior are normal.        Assessment & Plan:     ICD-10-CM   1. Shortness of breath  R06.02 ECHOCARDIOGRAM COMPLETE    Flutter valve    ECHOCARDIOGRAM COMPLETE   Reassess with 2D echo Has evidence of mild to moderate pulmonary hypertension    2. ILD (interstitial lung disease) (HCC)  J84.9 Flutter valve   Multifactorial Some evidence of UIP/IPF    3. Bronchiectasis without complication ([place])  [id number]  Flutter valve As needed albuterol for secretion clearance    4. Esophageal dysmotility  K22.4    Patulous esophagus noted on CT Retained food particles Prior evidence of aspiration GI evaluation 9/13    5. Cough  R05.9    Related to bronchiectasis/ILD Element of reflux with silent aspiration Continue antireflux measures Airway clearance and as needed SABA     Orders Placed This Encounter  Procedures   Flutter valve    Standing Status:   Future    Standing Expiration Date:   06/10/2022   ECHOCARDIOGRAM COMPLETE    Standing Status:   Future    Standing Expiration Date:   12/08/2021    Order Specific Question:   Where should this test be performed    Answer:   [facility]    Order Specific  Question:   Please indicate who you request to read the nuc med / echo results.    Answer:   [facility]    Order Specific Question:   Perflutren DEFINITY (image enhancing agent) should be administered unless hypersensitivity or allergy exist    Answer:   Administer Perflutren    Order Specific Question:   Reason for exam-Echo    Answer:   Dyspnea  R06.00   ECHOCARDIOGRAM COMPLETE    Standing Status:   Future    Standing Expiration Date:   12/08/2021    Order Specific Question:   Where should this test be performed    Answer:   [facility]    Order Specific Question:   Perflutren DEFINITY (image enhancing agent) should be administered unless hypersensitivity or allergy exist    Answer:   Administer Perflutren    Order Specific Question:   Reason for exam-Echo    Answer:   Dyspnea  R06.00   Meds ordered this encounter  Medications   Albuterol Sulfate, sensor, (PROAIR DIGIHALER) 108 (90 Base) MCG/ACT AEPB    Sig: Inhale 2 puffs into the lungs every 6 (six) hours.    Dispense:  1 each    Refill:  0    Order Specific Question:   Lot Number?    Answer:   [id number]    Order Specific Question:   Expiration Date?    Answer:   08/04/2022    Order Specific Question:   Quantity    Answer:   1    We will see the patient in follow-up in 4 to 6 weeks time she is to contact us prior to that time should any new difficulties arise.  Renold Don, MD Advanced Bronchoscopy PCCM [facility]    *This note was dictated using voice recognition software/Dragon.  Despite best efforts to proofread, errors can occur which can change the meaning.  Any change was purely unintentional.

## 2021-06-12 ENCOUNTER — Encounter: Payer: Self-pay | Admitting: Physician Assistant

## 2021-06-12 ENCOUNTER — Ambulatory Visit (INDEPENDENT_AMBULATORY_CARE_PROVIDER_SITE_OTHER): Payer: Medicare Other | Admitting: Physician Assistant

## 2021-06-12 ENCOUNTER — Other Ambulatory Visit: Payer: Self-pay

## 2021-06-12 VITALS — BP 117/71 | HR 86 | Ht 68.0 in | Wt 112.6 lb

## 2021-06-12 DIAGNOSIS — J84112 Idiopathic pulmonary fibrosis: Secondary | ICD-10-CM | POA: Diagnosis not present

## 2021-06-12 DIAGNOSIS — K449 Diaphragmatic hernia without obstruction or gangrene: Secondary | ICD-10-CM

## 2021-06-12 DIAGNOSIS — M810 Age-related osteoporosis without current pathological fracture: Secondary | ICD-10-CM | POA: Diagnosis not present

## 2021-06-12 DIAGNOSIS — E538 Deficiency of other specified B group vitamins: Secondary | ICD-10-CM | POA: Diagnosis not present

## 2021-06-12 DIAGNOSIS — M059 Rheumatoid arthritis with rheumatoid factor, unspecified: Secondary | ICD-10-CM | POA: Diagnosis not present

## 2021-06-12 DIAGNOSIS — Z79899 Other long term (current) drug therapy: Secondary | ICD-10-CM | POA: Diagnosis not present

## 2021-06-12 DIAGNOSIS — J479 Bronchiectasis, uncomplicated: Secondary | ICD-10-CM

## 2021-06-12 DIAGNOSIS — J849 Interstitial pulmonary disease, unspecified: Secondary | ICD-10-CM

## 2021-06-12 DIAGNOSIS — M26609 Unspecified temporomandibular joint disorder, unspecified side: Secondary | ICD-10-CM | POA: Diagnosis not present

## 2021-06-12 DIAGNOSIS — Z8719 Personal history of other diseases of the digestive system: Secondary | ICD-10-CM | POA: Diagnosis not present

## 2021-06-12 LAB — COMPLETE METABOLIC PANEL WITH GFR
AG Ratio: 1.3 (calc) (ref 1.0–2.5)
ALT: 6 U/L (ref 6–29)
AST: 15 U/L (ref 10–35)
Albumin: 3.8 g/dL (ref 3.6–5.1)
Alkaline phosphatase (APISO): 55 U/L (ref 37–153)
BUN: 17 mg/dL (ref 7–25)
CO2: 28 mmol/L (ref 20–32)
Calcium: 9.1 mg/dL (ref 8.6–10.4)
Chloride: 100 mmol/L (ref 98–110)
Creat: 0.73 mg/dL (ref 0.60–0.95)
Globulin: 2.9 g/dL (calc) (ref 1.9–3.7)
Glucose, Bld: 85 mg/dL (ref 65–99)
Potassium: 4.5 mmol/L (ref 3.5–5.3)
Sodium: 136 mmol/L (ref 135–146)
Total Bilirubin: 0.6 mg/dL (ref 0.2–1.2)
Total Protein: 6.7 g/dL (ref 6.1–8.1)
eGFR: 80 mL/min/{1.73_m2} (ref >=60)

## 2021-06-12 LAB — CBC WITH DIFFERENTIAL/PLATELET
Absolute Monocytes: 486 cells/uL (ref 200–950)
Basophils Absolute: 48 cells/uL (ref 0–200)
Basophils Relative: 0.8 %
Eosinophils Absolute: 48 cells/uL (ref 15–500)
Eosinophils Relative: 0.8 %
HCT: 29.7 % — ABNORMAL LOW (ref 35.0–45.0)
Hemoglobin: 10.2 g/dL — ABNORMAL LOW (ref 11.7–15.5)
Lymphs Abs: 1650 cells/uL (ref 850–3900)
MCH: 38.1 pg — ABNORMAL HIGH (ref 27.0–33.0)
MCHC: 34.3 g/dL (ref 32.0–36.0)
MCV: 110.8 fL — ABNORMAL HIGH (ref 80.0–100.0)
MPV: 9 fL (ref 7.5–12.5)
Monocytes Relative: 8.1 %
Neutro Abs: 3768 cells/uL (ref 1500–7800)
Neutrophils Relative %: 62.8 %
Platelets: 349 10*3/uL (ref 140–400)
RBC: 2.68 10*6/uL — ABNORMAL LOW (ref 3.80–5.10)
RDW: 14.5 % (ref 11.0–15.0)
Total Lymphocyte: 27.5 %
WBC: 6 10*3/uL (ref 3.8–10.8)

## 2021-06-12 NOTE — Patient Instructions (Signed)
Standing Labs We placed an order today for your standing lab work.   Please have your standing labs drawn in December and every 3 months    If possible, please have your labs drawn 2 weeks prior to your appointment so that the provider can discuss your results at your appointment.  Please note that you may see your imaging and lab results in MyChart before we have reviewed them. We may be awaiting multiple results to interpret others before contacting you. Please allow our office up to 72 hours to thoroughly review all of the results before contacting the office for clarification of your results.  We have open lab daily: Monday through Thursday from 1:30-4:30 PM and Friday from 1:30-4:00 PM at the office of Dr. Shaili Deveshwar, [facility].   Please be advised, all patients with office appointments requiring lab work will take precedent over walk-in lab work.  If possible, please come for your lab work on Monday and Friday afternoons, as you may experience shorter wait times. The office is located at [address], [address], [place], [place] [address] No appointment is necessary.   Labs are drawn by [facility]. Please bring your co-pay at the time of your lab draw.  You may receive a bill from [facility] for your lab work.  If you wish to have your labs drawn at another location, please call the office 24 hours in advance to send orders.  If you have any questions regarding directions or hours of operation,  please call [phone].   As a reminder, please drink plenty of water prior to coming for your lab work. Thanks!  

## 2021-06-15 NOTE — Progress Notes (Signed)
CMP WNL.  Hgb and hct remain low but are stable.  We will continue to monitor lab work every 3 months.

## 2021-06-16 ENCOUNTER — Ambulatory Visit (INDEPENDENT_AMBULATORY_CARE_PROVIDER_SITE_OTHER): Payer: Medicare Other | Admitting: Internal Medicine

## 2021-06-16 ENCOUNTER — Encounter: Payer: Self-pay | Admitting: Internal Medicine

## 2021-06-16 VITALS — BP 118/56 | HR 70 | Ht 68.0 in | Wt 111.0 lb

## 2021-06-16 DIAGNOSIS — K224 Dyskinesia of esophagus: Secondary | ICD-10-CM | POA: Diagnosis not present

## 2021-06-16 DIAGNOSIS — K219 Gastro-esophageal reflux disease without esophagitis: Secondary | ICD-10-CM

## 2021-06-16 DIAGNOSIS — J479 Bronchiectasis, uncomplicated: Secondary | ICD-10-CM | POA: Diagnosis not present

## 2021-06-16 DIAGNOSIS — J849 Interstitial pulmonary disease, unspecified: Secondary | ICD-10-CM

## 2021-06-16 DIAGNOSIS — T17908A Unspecified foreign body in respiratory tract, part unspecified causing other injury, initial encounter: Secondary | ICD-10-CM

## 2021-06-16 MED ORDER — PANTOPRAZOLE SODIUM 20 MG PO TBEC: 20.0000 mg | DELAYED_RELEASE_TABLET | Freq: Two times a day (BID) | ORAL | 3 refills | Status: DC

## 2021-06-16 MED ORDER — PANTOPRAZOLE SODIUM 20 MG PO TBEC: 20.0000 mg | DELAYED_RELEASE_TABLET | Freq: Two times a day (BID) | ORAL | 1 refills | Status: DC

## 2021-06-16 NOTE — Progress Notes (Signed)
Patient ID: Crystal Gregory, female   DOB: Feb 14, 1932, 87 y.o.   MRN: [id number] HPI: Kirin Aufderheide is an 87 year old female with a history of GERD, idiopathic pulmonary fibrosis, bronchiectasis, history of pneumonia and concern for aspiration, rheumatoid arthritis on azathioprine who is seen to evaluate her history of reflux disease and also concern for aspiration leading to pneumonia.  She is here alone today.  She was seen here in 2014 and 2015 initially to evaluate diarrhea and loose stools.  She later had a flexible sigmoidoscopy which I performed in June 2015 to evaluate anorectal pain and change in bowel habit.  She was diagnosed with anal fissure and internal hemorrhoids.  We treated anal fissure topically and it healed without recurrence.  She reports that she has been seeing Dr. Patsey Berthold with a diagnosis of IPF.  She also has had imaging suggesting reflux and aspiration.  She reports that she does not have issue with heartburn though she is taking pantoprazole 20 mg twice daily.  She uses this after eating.  She denies dysphagia and odynophagia.  She is not having chest pain.  There is no nausea or vomiting.  She reports she can eat and drink without having food stick or slow with her swallowing.  She does not have coughing with swallowing.  She does have frequent coughing with mucus production which she relates to her bronchiectasis and IPF.  She does not have nocturnal episodes where she wakes up with coughing.  She reports her bowel habits as regular without blood in her stool nor melena.  She does report significant stress with her husband.  He has advanced CHF, stage IV chronic kidney disease, he has been treated for a venous stasis ulcer and recently found to have profound anemia despite IV iron infusion.  He will need blood transfusion later this week.  Past Medical History:  Diagnosis Date   Allergy    Anal fissure    Aspiration into airway    CAP (community acquired pneumonia)  08/23/2017   Chest pain    a. 09/04/2018 MV: EF >65%. No ischemia/infarct.   Coronary artery calcification seen on CT scan    Diastolic dysfunction    a. 10/2016 Echo: EF 60-65%, no rwma, Gr1 DD, mild MR, nl RV fxn, mild to mod TR, PASP 30mHg.   Diverticulosis    Dyspnea    GERD (gastroesophageal reflux disease)    Hemorrhoids    Hyperplastic colon polyp    Interstitial lung disorders ([place]    [place]    [place]   spent 4 weeks in ICU, intubated -? PNA    Past Surgical History:  Procedure Laterality Date   ABDOMINAL HYSTERECTOMY     FLEXIBLE BRONCHOSCOPY N/A 07/05/2016   Procedure: FLEXIBLE BRONCHOSCOPY;  Surgeon: VVilinda Boehringer MD;  Location: [facility] ORS;  Service: Cardiopulmonary;  Laterality: N/A;    Outpatient Medications Prior to Visit  Medication Sig Dispense Refill   Albuterol Sulfate, sensor, (PROAIR DIGIHALER) 108 (90 Base) MCG/ACT AEPB Inhale 2 puffs into the lungs every 6 (six) hours. (Patient taking differently: Inhale 2 puffs into the lungs as needed.) 1 each 0   azaTHIOprine (IMURAN) 50 MG tablet Take 75 mg every morning and 50 mg every evening. 225 tablet 0   denosumab (PROLIA) 60 MG/ML SOLN injection Inject 60 mg into the skin every 6 (six) months. Administer in upper arm, thigh, or abdomen     gabapentin (NEURONTIN) 100 MG capsule Take 1 capsule (100 mg total) by  mouth at bedtime. 90 capsule 3   Multiple Minerals-Vitamins (CALCIUM & VIT D3 BONE HEALTH PO) Take 1 Dose by mouth daily.     predniSONE (DELTASONE) 1 MG tablet Take 2 tablets (2 mg total) by mouth daily with breakfast. 180 tablet 1   propranolol (INDERAL) 10 MG tablet Take 1 tablet (10 mg total) by mouth 2 (two) times daily. For tremor. 180 tablet 3   traZODone (DESYREL) 50 MG tablet TAKE 1 TABLET AT BEDTIME FOR SLEEP 90 tablet 3   vitamin B-12 (CYANOCOBALAMIN) 500 MCG tablet Take 500 mcg by mouth daily.     pantoprazole (PROTONIX) 20 MG tablet Take 1 tablet (20 mg total) by  mouth 2 (two) times daily. For heartburn. 180 tablet 3   montelukast (SINGULAIR) 10 MG tablet Take 1 tablet (10 mg total) by mouth daily. 90 tablet 3   azithromycin (ZITHROMAX) 250 MG tablet Take 1 tablet (250 mg total) by mouth 3 (three) times a week. (Patient not taking: Reported on 06/12/2021) 36 each 3   No facility-administered medications prior to visit.    No Known Allergies  Family History  Problem Relation Age of Onset   Parkinson's disease Mother    Colon polyps Mother 66           Ulcers Father    Rheumatic fever Father    Macular degeneration Sister    Healthy Son    Healthy Daughter     Social History   Tobacco Use   Smoking status: Former    Packs/day: 0.25    Years: 20.00    Pack years: 5.00    Types: Cigarettes    Quit date: 01/16/1981    Years since quitting: 40.4   Smokeless tobacco: Never  Vaping Use   Vaping Use: Never used  Substance Use Topics   Alcohol use: Yes    Comment: 2 drinks daily    Drug use: No    ROS: As per history of present illness, otherwise negative  BP (!) 118/56   Pulse 70   Ht '5\' 8"'$  (1.727 m)   Wt 111 lb (50.3 kg)   BMI 16.88 kg/m  Constitutional: Well-developed and well-nourished. No distress. HEENT: Normocephalic and atraumatic.  No scleral icterus. Cardiovascular: Normal rate, regular rhythm and intact distal pulses. No M/R/G Pulmonary/chest: Effort normal with fine crackles bilateral bases, no wheezing. Abdominal: Soft, nontender, nondistended. Bowel sounds active throughout.  Extremities: no clubbing, cyanosis, or edema Neurological: Alert and oriented to person place and time. Skin: Skin is warm and dry.  Psychiatric: Normal mood and affect. Behavior is normal.  RELEVANT LABS AND IMAGING: CBC    Component Value Date/Time   WBC 6.0 06/12/2021 1107   RBC 2.68 (L) 06/12/2021 1107   HGB 10.2 (L) 06/12/2021 1107   HCT 29.7 (L) 06/12/2021 1107   PLT 349 06/12/2021 1107   MCV 110.8 (H) 06/12/2021 1107   MCH 38.1  (H) 06/12/2021 1107   MCHC 34.3 06/12/2021 1107   RDW 14.5 06/12/2021 1107   LYMPHSABS 1,650 06/12/2021 1107   MONOABS 0.6 12/30/2020 0852   EOSABS 48 06/12/2021 1107   BASOSABS 48 06/12/2021 1107    CMP     Component Value Date/Time   NA 136 06/12/2021 1107   K 4.5 06/12/2021 1107   CL 100 06/12/2021 1107   CO2 28 06/12/2021 1107   GLUCOSE 85 06/12/2021 1107   BUN 17 06/12/2021 1107   CREATININE 0.73 06/12/2021 1107   CALCIUM 9.1 06/12/2021 1107  PROT 6.7 06/12/2021 1107   ALBUMIN 3.9 12/30/2020 0852   AST 15 06/12/2021 1107   ALT 6 06/12/2021 1107   ALKPHOS 39 12/30/2020 0852   BILITOT 0.6 06/12/2021 1107   GFRNONAA 79 03/06/2021 1120   GFRAA 91 03/06/2021 1120   CT CHEST WITHOUT CONTRAST   TECHNIQUE: Multidetector CT imaging of the chest was performed following the standard protocol without IV contrast.   COMPARISON:  03/24/2021   FINDINGS: Cardiovascular: Aortic atherosclerosis. Normal heart size. Left and right coronary artery calcifications. No pericardial effusion.   Mediastinum/Nodes: No enlarged mediastinal, hilar, or axillary lymph nodes. Patulous, air-filled esophagus. Thyroid gland and trachea demonstrate no significant findings.   Lungs/Pleura: No significant change in a moderate pulmonary fibrosis in a pattern with apical to basal gradient, featuring irregular peripheral interstitial opacity, septal thickening, traction bronchiectasis, subpleural bronchiolectasis, and extensive areas of honeycombing at the lung bases. There is superimposed cystic and varicoid bronchiectasis as well as dense consolidation and bronchial plugging of the right lung base (series 3, image 134). Unchanged small, loculated right pleural effusion about the posterior right apex (series 3, image 38).   Upper Abdomen: No acute abnormality.   Musculoskeletal: No chest wall mass or suspicious bone lesions identified.   IMPRESSION: 1. No significant change in a moderate  pulmonary fibrosis in a pattern with apical to basal gradient, featuring irregular peripheral interstitial opacity, septal thickening, traction bronchiectasis, subpleural bronchiolectasis, and extensive areas of honeycombing at the lung bases. Findings remain in a UIP pattern by pulmonary fibrosis criteria. Findings are consistent with UIP per consensus guidelines: Diagnosis of Idiopathic Pulmonary Fibrosis: An Official ATS/ERS/JRS/ALAT Clinical Practice Guideline. [place], Iss 5, [phone], Jun 04 2017. 2. There is superimposed cystic and varicoid bronchiectasis as well as dense consolidation and bronchial plugging of the right lung base, overall appearance not significantly changed, most consistent with aspiration, which may be ongoing. 3. Unchanged small, loculated right pleural effusion about the posterior right apex. 4. Coronary artery disease.   Aortic Atherosclerosis (ICD10-I70.0).     Electronically Signed   By: Eddie Candle M.D.   On: 05/06/2021 15:37    ESOPHOGRAM / BARIUM SWALLOW / BARIUM TABLET STUDY   TECHNIQUE: Combined double contrast and single contrast examination performed using effervescent crystals, thick barium liquid, and thin barium liquid. The patient was observed with fluoroscopy swallowing a 13 mm barium sulphate tablet.   FLUOROSCOPY TIME:  Fluoroscopy Time:  1.1 minute   Radiation Exposure Index (if provided by the fluoroscopic device): 3.6 mGy   Number of Acquired Spot Images: 0   COMPARISON:  None.   FINDINGS: Normal pharyngeal anatomy and motility. Single episode of tracheal aspiration without cough reflex. Contrast flowed freely through the esophagus without evidence of a stricture or mass. Normal esophageal mucosa without evidence of irregularity or ulceration. Tertiary contractions of the esophagus as can be seen with mild spasm. Mild gastroesophageal reflux. No definite hiatal hernia was demonstrated.   At  the end of the examination a 13 mm barium tablet was administered which transited through the esophagus and esophagogastric junction without delay.   IMPRESSION: Single episode of tracheal aspiration without cough reflex.   Tertiary contractions of the esophagus as can be seen with mild spasm.   Mild gastroesophageal reflux.     Electronically Signed   By: Kathreen Devoid   On: 03/05/2020 09:59    ASSESSMENT/PLAN: 87 year old female with a history of GERD, idiopathic pulmonary fibrosis, bronchiectasis, history of pneumonia  and concern for aspiration, rheumatoid arthritis on azathioprine who is seen to evaluate her history of reflux disease and also concern for aspiration leading to pneumonia.    GERD/esophageal dysmotility --we discussed her previous evaluation which includes barium esophagram with tablet.  This was enlightening and that it did confirm reflux but also tertiary contractions consistent with esophageal dysmotility.  We discussed this at length today and this is likely a combination of presbyesophagus but also possibly esophageal dysmotility exacerbated by reflux.  She is taking her pantoprazole after meals and so it may be incompletely effective at controlling reflux.  There was no concerning esophageal changes to warrant upper endoscopy and she is not having dysphagia nor odynophagia.  See #2 regarding aspiration --I advised that she move her pantoprazole 20 mg twice daily to at least 30 minutes before her first and last meal of the day --No role for upper endoscopy at this time based on the above  2.  Aspiration without cough --the barium esophagram revealed that she is having tracheal aspiration without cough reflex.  We discussed how this can lead to pneumonitis and may exacerbate her bronchiectasis and pulmonary fibrosis.  She works closely with Dr. Patsey Berthold with management of her lung disease.  I do think she would benefit from a speech and swallow therapy evaluation to see  if there is any therapy that can be offered to help avoid tracheal aspiration --Speech and swallow therapy evaluation plus management; referral to [facility] which is closer to her home  3.  IPF and bronchiectasis --following with Dr. Patsey Berthold  She can follow-up with GI as needed    [id number], [place]icia [place], [place],   [id number]

## 2021-06-16 NOTE — Patient Instructions (Addendum)
Referral has been made to Outpatient Services for Speech Therapy at [facility]. Someone from their office will contact you to schedule this appointment. If you have not heard from them within 2 days please contact them [phone].  Take your Pantoprazole 30 min before meals twice daily.   We have sent the following medications to your pharmacy for you to pick up at your convenience: Pantoprazole   If you are age 85 or older, your body mass index should be between 23-30. Your Body mass index is 16.88 kg/m. If this is out of the aforementioned range listed, please consider follow up with your Primary Care Provider.   __________________________________________________________  The [place] [facility] providers would like to encourage you to use [facility] to communicate with providers for non-urgent requests or questions.  Due to long hold times on the telephone, sending your provider a message by [facility] may be a faster and more efficient way to get a response.  Please allow 48 business hours for a response.  Please remember that this is for non-urgent requests.   Thank you for choosing me and [facility].  Dr.Pyrtle

## 2021-06-30 ENCOUNTER — Ambulatory Visit: Payer: Medicare Other | Attending: Internal Medicine | Admitting: Speech Pathology

## 2021-06-30 ENCOUNTER — Other Ambulatory Visit: Payer: Self-pay

## 2021-06-30 DIAGNOSIS — R1313 Dysphagia, pharyngeal phase: Secondary | ICD-10-CM | POA: Insufficient documentation

## 2021-07-01 ENCOUNTER — Telehealth: Payer: Self-pay

## 2021-07-01 ENCOUNTER — Other Ambulatory Visit: Payer: Self-pay | Admitting: Pulmonary Disease

## 2021-07-01 DIAGNOSIS — R1310 Dysphagia, unspecified: Secondary | ICD-10-CM

## 2021-07-01 DIAGNOSIS — T17908A Unspecified foreign body in respiratory tract, part unspecified causing other injury, initial encounter: Secondary | ICD-10-CM

## 2021-07-01 NOTE — Telephone Encounter (Signed)
Got a call from Santel from [facility] speech therapy requesting I place order for a study, order placed nothing further needed.

## 2021-07-02 ENCOUNTER — Encounter: Payer: Self-pay | Admitting: Nurse Practitioner

## 2021-07-02 ENCOUNTER — Inpatient Hospital Stay: Payer: Medicare Other | Attending: Nurse Practitioner

## 2021-07-02 ENCOUNTER — Other Ambulatory Visit: Payer: Self-pay

## 2021-07-02 ENCOUNTER — Other Ambulatory Visit: Payer: Self-pay | Admitting: Pulmonary Disease

## 2021-07-02 ENCOUNTER — Inpatient Hospital Stay (HOSPITAL_BASED_OUTPATIENT_CLINIC_OR_DEPARTMENT_OTHER): Payer: Medicare Other | Admitting: Nurse Practitioner

## 2021-07-02 VITALS — BP 120/63 | HR 79 | Temp 97.7°F | Resp 18 | Wt 112.2 lb

## 2021-07-02 DIAGNOSIS — Z79899 Other long term (current) drug therapy: Secondary | ICD-10-CM | POA: Insufficient documentation

## 2021-07-02 DIAGNOSIS — Z83518 Family history of other specified eye disorder: Secondary | ICD-10-CM | POA: Diagnosis not present

## 2021-07-02 DIAGNOSIS — K219 Gastro-esophageal reflux disease without esophagitis: Secondary | ICD-10-CM | POA: Insufficient documentation

## 2021-07-02 DIAGNOSIS — J8410 Pulmonary fibrosis, unspecified: Secondary | ICD-10-CM | POA: Diagnosis not present

## 2021-07-02 DIAGNOSIS — T17908A Unspecified foreign body in respiratory tract, part unspecified causing other injury, initial encounter: Secondary | ICD-10-CM

## 2021-07-02 DIAGNOSIS — Z87891 Personal history of nicotine dependence: Secondary | ICD-10-CM | POA: Diagnosis not present

## 2021-07-02 DIAGNOSIS — R0602 Shortness of breath: Secondary | ICD-10-CM | POA: Diagnosis not present

## 2021-07-02 DIAGNOSIS — D539 Nutritional anemia, unspecified: Secondary | ICD-10-CM

## 2021-07-02 DIAGNOSIS — Z8719 Personal history of other diseases of the digestive system: Secondary | ICD-10-CM | POA: Insufficient documentation

## 2021-07-02 DIAGNOSIS — Z818 Family history of other mental and behavioral disorders: Secondary | ICD-10-CM | POA: Insufficient documentation

## 2021-07-02 DIAGNOSIS — M2550 Pain in unspecified joint: Secondary | ICD-10-CM | POA: Diagnosis not present

## 2021-07-02 DIAGNOSIS — E538 Deficiency of other specified B group vitamins: Secondary | ICD-10-CM

## 2021-07-02 DIAGNOSIS — D638 Anemia in other chronic diseases classified elsewhere: Secondary | ICD-10-CM | POA: Diagnosis not present

## 2021-07-02 DIAGNOSIS — R1310 Dysphagia, unspecified: Secondary | ICD-10-CM

## 2021-07-02 DIAGNOSIS — M069 Rheumatoid arthritis, unspecified: Secondary | ICD-10-CM | POA: Insufficient documentation

## 2021-07-02 DIAGNOSIS — D7589 Other specified diseases of blood and blood-forming organs: Secondary | ICD-10-CM | POA: Diagnosis not present

## 2021-07-02 LAB — IRON AND TIBC
Iron: 61 ug/dL (ref 28–170)
Saturation Ratios: 21 % (ref 10.4–31.8)
TIBC: 293 ug/dL (ref 250–450)
UIBC: 232 ug/dL

## 2021-07-02 LAB — CBC
HCT: 31.5 % — ABNORMAL LOW (ref 36.0–46.0)
Hemoglobin: 10.3 g/dL — ABNORMAL LOW (ref 12.0–15.0)
MCH: 37.5 pg — ABNORMAL HIGH (ref 26.0–34.0)
MCHC: 32.7 g/dL (ref 30.0–36.0)
MCV: 114.5 fL — ABNORMAL HIGH (ref 80.0–100.0)
Platelets: 341 10*3/uL (ref 150–400)
RBC: 2.75 MIL/uL — ABNORMAL LOW (ref 3.87–5.11)
RDW: 15.6 % — ABNORMAL HIGH (ref 11.5–15.5)
WBC: 5.9 10*3/uL (ref 4.0–10.5)
nRBC: 0 % (ref 0.0–0.2)

## 2021-07-02 LAB — FERRITIN: Ferritin: 60 ng/mL (ref 11–307)

## 2021-07-02 LAB — VITAMIN B12: Vitamin B-12: 807 pg/mL (ref 180–914)

## 2021-07-02 NOTE — Progress Notes (Signed)
Patient feels chronically fatigued. Appetite is reported as good. No visible blood loss noted. Bowels are regular.

## 2021-07-02 NOTE — Progress Notes (Signed)
Hematology/Oncology Consult Note [facility]  Telephone:([phone] [phone]  Patient Care Team: Pleas Koch, NP as PCP - General (Internal Medicine) Minna Merritts, MD as PCP - Cardiology (Cardiology) Minna Merritts, MD as Consulting Physician (Cardiology) Marlowe Sax, MD as Consulting Physician (Rheumatology)   Name of the patient: Crystal Gregory  [id number]  1932-07-18   Date of visit: 07/02/21  Diagnosis- macrocytic anemia likely anemia of chronic disease and component of B12 deficiency versus possible MDS  Chief complaint/ Reason for visit-routine follow-up of anemia  Heme/Onc history: Patient is a 87 year old female referred for macrocytic anemia.  She has a prior history of rheumatoid arthritis as well as interstitial lung disease from rheumatoid arthritis as well.  She is not on home oxygen.  She has been referred for anemia.  Dating back at her CBC is her hemoglobin was always between 10-11 even back in 2018 and 2019.  Her most recent CBC from 08/06/2020 showed white count of 4.7, H&H of 9.5/28.8 with an MCV of 114 and platelet count of 276.  She has developed progressive macrocytosis since 2019.  B12 levels were mildly low at 277.  Patient reports chronic fatigue and exertional shortness of breath.   She is on Imuran for rheumatoid arthritis   Results of blood work from 08/11/2020 were as follows: CBC showed white count of 4.8, H&H of 9.9/29.6 with an MCV of 114 and a platelet count of 300.  B12 level was low at 277.  Folate and TSH was normal.  EPO level was elevated at 100.  Haptoglobin was normal.  Myeloma panel showed polyclonal increase but no monoclonal protein.  Reticulocyte count was inappropriately low for the degree of anemia at 2.2%.  CMP showed normal kidney and liver functions.   Interval history-patient is 87 year old female who returns to clinic for labs and further evaluation.  She continues to remain in  her baseline state of health.  No recent hospitalizations.  Denies abnormal bleeding or bruising.  Continues to be followed by pulmonology for history of pulmonary fibrosis.  Is currently having a flare of her rheumatoid arthritis.  She remains active as tolerated.  ECOG PS- 1 Pain scale- 0   Review of systems- Review of Systems  Constitutional:  Negative for chills, fever, malaise/fatigue and weight loss.  HENT:  Negative for congestion, ear discharge and nosebleeds.   Eyes:  Negative for blurred vision.  Respiratory:  Negative for cough, hemoptysis, sputum production, shortness of breath and wheezing.   Cardiovascular:  Negative for chest pain, palpitations, orthopnea and claudication.  Gastrointestinal:  Negative for abdominal pain, blood in stool, constipation, diarrhea, heartburn, melena, nausea and vomiting.  Genitourinary:  Negative for dysuria, flank pain, frequency, hematuria and urgency.  Musculoskeletal:  Positive for joint pain. Negative for back pain and myalgias.  Skin:  Negative for rash.  Neurological:  Negative for dizziness, tingling, focal weakness, seizures, weakness and headaches.  Endo/Heme/Allergies:  Does not bruise/bleed easily.  Psychiatric/Behavioral:  Negative for depression and suicidal ideas. The patient does not have insomnia.      No Known Allergies   Past Medical History:  Diagnosis Date   Allergy    Anal fissure    Aspiration into airway    CAP (community acquired pneumonia) 08/23/2017   Chest pain    a. 09/04/2018 MV: EF >65%. No ischemia/infarct.   Coronary artery calcification seen on CT scan    Diastolic dysfunction    a. 10/2016 Echo: EF  60-65%, no rwma, Gr1 DD, mild MR, nl RV fxn, mild to mod TR, PASP 14mHg.   Diverticulosis    Dyspnea    GERD (gastroesophageal reflux disease)    Hemorrhoids    Hyperplastic colon polyp    Interstitial lung disorders ([place]    Pulmonary fibrosis ([place]    [place]   spent 4 weeks in ICU,  intubated -? PNA     Past Surgical History:  Procedure Laterality Date   ABDOMINAL HYSTERECTOMY     FLEXIBLE BRONCHOSCOPY N/A 07/05/2016   Procedure: FLEXIBLE BRONCHOSCOPY;  Surgeon: VVilinda Boehringer MD;  Location: [facility] ORS;  Service: Cardiopulmonary;  Laterality: N/A;    Social History   Socioeconomic History   Marital status: Married    Spouse name: Not on file   Number of children: 2   Years of education: Not on file   Highest education level: Not on file  Occupational History   Occupation: Retired   Tobacco Use   Smoking status: Former    Packs/day: 0.25    Years: 20.00    Pack years: 5.00    Types: Cigarettes    Quit date: 01/16/1981    Years since quitting: 40.4   Smokeless tobacco: Never  Vaping Use   Vaping Use: Never used  Substance and Sexual Activity   Alcohol use: Yes    Comment: 2 drinks daily    Drug use: No   Sexual activity: Not on file  Other Topics Concern   Not on file  Social History Narrative   Married.     2 children.  [place]   Social Determinants of Health   Financial Resource Strain: Not on file  Food Insecurity: Not on file  Transportation Needs: Not on file  Physical Activity: Not on file  Stress: Not on file  Social Connections: Not on file  Intimate Partner Violence: Not on file    Family History  Problem Relation Age of Onset   Parkinson's disease Mother    Colon polyps Mother 849          Ulcers Father    Rheumatic fever Father    Macular degeneration Sister    Healthy Son    Healthy Daughter      Current Outpatient Medications:    azaTHIOprine (IMURAN) 50 MG tablet, Take 75 mg every morning and 50 mg every evening., Disp: 225 tablet, Rfl: 0   denosumab (PROLIA) 60 MG/ML SOLN injection, Inject 60 mg into the skin every 6 (six) months. Administer in upper arm, thigh, or abdomen, Disp: , Rfl:    gabapentin (NEURONTIN) 100 MG capsule, Take 1 capsule (100 mg total) by mouth at bedtime., Disp: 90  capsule, Rfl: 3   Multiple Minerals-Vitamins (CALCIUM & VIT D3 BONE HEALTH PO), Take 1 Dose by mouth daily., Disp: , Rfl:    predniSONE (DELTASONE) 1 MG tablet, Take 2 tablets (2 mg total) by mouth daily with breakfast., Disp: 180 tablet, Rfl: 1   propranolol (INDERAL) 10 MG tablet, Take 1 tablet (10 mg total) by mouth 2 (two) times daily. For tremor., Disp: 180 tablet, Rfl: 3   traZODone (DESYREL) 50 MG tablet, TAKE 1 TABLET AT BEDTIME FOR SLEEP, Disp: 90 tablet, Rfl: 3   vitamin B-12 (CYANOCOBALAMIN) 500 MCG tablet, Take 500 mcg by mouth daily., Disp: , Rfl:    Albuterol Sulfate, sensor, (PROAIR DIGIHALER) 108 (90 Base) MCG/ACT AEPB, Inhale 2 puffs into the lungs every 6 (six) hours. (  Patient not taking: Reported on 07/02/2021), Disp: 1 each, Rfl: 0   montelukast (SINGULAIR) 10 MG tablet, Take 1 tablet (10 mg total) by mouth daily., Disp: 90 tablet, Rfl: 3  Physical exam:  Vitals:   07/02/21 1018  BP: 120/63  Pulse: 79  Resp: 18  Temp: 97.7 F (36.5 C)  TempSrc: Tympanic  SpO2: 100%  Weight: 112 lb 3.2 oz (50.9 kg)   Physical Exam Constitutional:      General: She is not in acute distress. Cardiovascular:     Rate and Rhythm: Normal rate.  Pulmonary:     Effort: Pulmonary effort is normal.     Breath sounds: Normal breath sounds.  Skin:    General: Skin is warm and dry.  Neurological:     Mental Status: She is alert and oriented to person, place, and time.     CMP Latest Ref Rng & Units 06/12/2021  Glucose 65 - 99 mg/dL 85  BUN 7 - 25 mg/dL 17  Creatinine 0.60 - 0.95 mg/dL 0.73  Sodium 135 - 146 mmol/L 136  Potassium 3.5 - 5.3 mmol/L 4.5  Chloride 98 - 110 mmol/L 100  CO2 20 - 32 mmol/L 28  Calcium 8.6 - 10.4 mg/dL 9.1  Total Protein 6.1 - 8.1 g/dL 6.7  Total Bilirubin 0.2 - 1.2 mg/dL 0.6  Alkaline Phos 38 - 126 U/L -  AST 10 - 35 U/L 15  ALT 6 - 29 U/L 6   CBC Latest Ref Rng & Units 07/02/2021  WBC 4.0 - 10.5 K/uL 5.9  Hemoglobin 12.0 - 15.0 g/dL 10.3(L)   Hematocrit 36.0 - 46.0 % 31.5(L)  Platelets 150 - 400 K/uL 341      Assessment and plan- Patient is a 87 y.o. female with macrocytic anemia who returns to clinic for routine follow-up.  She continues to have chronic, stable macrocytic anemia since November 2019.  Baseline hemoglobin remains between 10 and 11 since 2018.  Anemia work-up was overall unremarkable previously except for mildly low B12.  No improvement with injections and she continues oral B12.  Suspected etiology of her anemia has been chronic disease but given macrocytosis and element of MDS cannot be ruled out.  However, her counts are stable and she does not have other cytopenias beyond anemia.  Ferritin and iron studies today iron normal and no recommendation for IV iron.  Continue to monitor conservatively.  If she develops additional cytopenias or worsening anemia would consider bone marrow biopsy.  Return to clinic in 6 months for labs (CBC, ferritin, iron and TIBC, B12), follow-up with Dr. Janese Banks  Visit Diagnosis 1. Macrocytic anemia   2. Anemia of chronic disease   3. B12 deficiency    Thank you for allowing me to participate in the care of this very pleasant patient.  Beckey Rutter, DNP, AGNP-C [place] at [facility] [phone] (clinic) 07/02/2021 12:18 PM

## 2021-07-02 NOTE — Therapy (Signed)
[place] MAIN [facility] SERVICES [address] [place], [place], [address] Phone: [phone]   Fax:  [phone]  Speech Language Pathology Evaluation  Patient Details  Name: Crystal Gregory MRN: [id number] Date of Birth: 07/24/1932 Referring Provider (SLP): Zenovia Jarred   Encounter Date: 06/30/2021   End of Session - 07/02/21 0830     Visit Number 1    Number of Visits 13    Date for SLP Re-Evaluation 08/27/21    Authorization Type Medicare    Authorization Time Period 06/30/2021 thru 08/27/2021    Authorization - Visit Number 1    Progress Note Due on Visit 10    SLP Start Time 1400    SLP Stop Time  1500    SLP Time Calculation (min) 60 min    Activity Tolerance Patient tolerated treatment well             Past Medical History:  Diagnosis Date   Allergy    Anal fissure    Aspiration into airway    CAP (community acquired pneumonia) 08/23/2017   Chest pain    a. 09/04/2018 MV: EF >65%. No ischemia/infarct.   Coronary artery calcification seen on CT scan    Diastolic dysfunction    a. 10/2016 Echo: EF 60-65%, no rwma, Gr1 DD, mild MR, nl RV fxn, mild to mod TR, PASP 48mmHg.   Diverticulosis    Dyspnea    GERD (gastroesophageal reflux disease)    Hemorrhoids    Hyperplastic colon polyp    Interstitial lung disorders ([place])    Pulmonary fibrosis ([place])    Septicemia ([place]) 1999   spent 4 weeks in ICU, intubated -? PNA    Past Surgical History:  Procedure Laterality Date   ABDOMINAL HYSTERECTOMY     FLEXIBLE BRONCHOSCOPY N/A 07/05/2016   Procedure: FLEXIBLE BRONCHOSCOPY;  Surgeon: Vilinda Boehringer, MD;  Location: [facility] ORS;  Service: Cardiopulmonary;  Laterality: N/A;    There were no vitals filed for this visit.   Subjective Assessment - 07/01/21 1807     Subjective "I don't know why I am here for speech therapy"    Currently in Pain? No/denies                SLP Evaluation [facility] - 07/02/21 0800       SLP Visit  Information   SLP Received On 06/30/21    Referring Provider (SLP) Zenovia Jarred    Onset Date 06/16/2021    Medical Diagnosis Aspiration into Airway, Dysphagia unspecified      Prior Functional Status   Cognitive/Linguistic Baseline Within functional limits      Oral Motor/Sensory Function   Overall Oral Motor/Sensory Function Appears within functional limits for tasks assessed      Motor Speech   Overall Motor Speech Appears within functional limits for tasks assessed    Respiration Impaired    Level of Impairment Phrase    Phonation Breathy;Low vocal intensity      Standardized Assessments   Standardized Assessments  --   Clinical Swallow Evaluation     Assessment   Clinical Impression Statement (ACUTE ONLY) Pt presents with documented silent aspiration of thin liquids > 1 year prior who is now respiratory decline as evidenced by recent chest CTs. Pt is very short of breath during ambulation and conversation. Extensive education provided to the pt on silent aspiration noted during esophagram (03/2020), definition of silent aspiration, risks of silent aspiration as well results of chest CT. Recommend pt  participate in Modified Barium Swallow Study to assess oropharyngeal dysphagia, aspiration and to create POC reducing risk of aspiration pneumonia. While pt voiced understanding, she is likely to require continued education on the above topics.                SLP Education - 07/02/21 0830     Education Details silent aspiration, need for instrumental assessment of swallow function    Person(s) Educated Patient    Methods Explanation;Demonstration;Verbal cues;Handout    Comprehension Verbalized understanding;Need further instruction                SLP Long Term Goals - 07/02/21 9643       SLP LONG TERM GOAL #1   Title Pt will demonstrate improvement in swallow function as evidenced by decreased aspiration on instrumental swallow study.    Time 8    Period Weeks     Status New    Target Date 08/27/21              Plan - 07/02/21 0831     Speech Therapy Frequency 2x / week    Duration 8 weeks    Treatment/Interventions Aspiration precaution training;Diet toleration management by SLP;Pharyngeal strengthening exercises;SLP instruction and feedback;Patient/family education;Trials of upgraded texture/liquids    Potential to Achieve Goals Good    Consulted and Agree with Plan of Care Patient             Patient will benefit from skilled therapeutic intervention in order to improve the following deficits and impairments:   Dysphagia, pharyngeal phase    Problem List Patient Active Problem List   Diagnosis Date Noted   B12 deficiency 09/02/2020   Anemia 08/06/2020   Lower extremity edema 03/06/2018   TMJ (temporomandibular joint syndrome) 03/02/2018   RA (rheumatoid arthritis) ([place]) 07/13/2017   Polyarthralgia 04/25/2017   ILD (interstitial lung disease) ([place]) 06/18/2016   IPF (idiopathic pulmonary fibrosis) ([place]) 03/23/2016   Bronchiectasis without complication ([place]) 83/81/8403   Allergic rhinitis 03/22/2016   Anal fissure 08/18/2015   Insomnia 08/18/2015   Osteoporosis 02/13/2015   Stress due to illness of family member 09/16/2014   Hiatal hernia 10/13/2012   GERD (gastroesophageal reflux disease)    Allergy    Kiaja Shorty B. Rutherford Nail M.S., CCC-SLP, [place] Pathologist Rehabilitation Services Office [phone]  Stormy Fabian 07/02/2021, 8:34 AM  [place] MAIN [facility] SERVICES [address] [place], [place], [address] Phone: [phone]   Fax:  [phone]  Name: Crystal Gregory MRN: [id number] Date of Birth: 1932/01/19

## 2021-07-07 ENCOUNTER — Ambulatory Visit: Payer: Medicare Other | Admitting: Speech Pathology

## 2021-07-09 ENCOUNTER — Other Ambulatory Visit: Payer: Self-pay

## 2021-07-09 ENCOUNTER — Ambulatory Visit (INDEPENDENT_AMBULATORY_CARE_PROVIDER_SITE_OTHER): Payer: Medicare Other

## 2021-07-09 DIAGNOSIS — R0602 Shortness of breath: Secondary | ICD-10-CM | POA: Diagnosis not present

## 2021-07-09 LAB — ECHOCARDIOGRAM COMPLETE
AR max vel: 2.75 cm2
AV Area VTI: 2.67 cm2
AV Area mean vel: 2.81 cm2
AV Mean grad: 4 mmHg
AV Peak grad: 7.6 mmHg
Ao pk vel: 1.38 m/s
Area-P 1/2: 3.31 cm2
Calc EF: 48.2 %
S' Lateral: 2.4 cm
Single Plane A2C EF: 46.6 %
Single Plane A4C EF: 51.4 %

## 2021-07-10 ENCOUNTER — Ambulatory Visit
Admission: RE | Admit: 2021-07-10 | Discharge: 2021-07-10 | Disposition: A | Discharge status: Home / Self Care | Payer: Medicare Other | Source: Ambulatory Visit | Attending: Pulmonary Disease | Admitting: Pulmonary Disease

## 2021-07-10 ENCOUNTER — Encounter: Payer: Self-pay | Admitting: Speech Pathology

## 2021-07-10 DIAGNOSIS — K219 Gastro-esophageal reflux disease without esophagitis: Secondary | ICD-10-CM | POA: Insufficient documentation

## 2021-07-10 DIAGNOSIS — R1310 Dysphagia, unspecified: Secondary | ICD-10-CM | POA: Insufficient documentation

## 2021-07-10 DIAGNOSIS — T17908A Unspecified foreign body in respiratory tract, part unspecified causing other injury, initial encounter: Secondary | ICD-10-CM | POA: Insufficient documentation

## 2021-07-10 NOTE — Progress Notes (Signed)
Modified Barium Swallow Progress Note  Patient Details  Name: Idalis Hoelting MRN: [id number] Date of Birth: Aug 26, 1932  Today's Date: 07/10/2021  Modified Barium Swallow completed.  Full report located under Chart Review in the Imaging Section.  Brief recommendations include the following:  Clinical Impression  Pt presents with adequate oropharyngeal abilities with no aspiration or penetration observed during the study. Pt's swallow initiation was timely with complete airway protection observed throughout trials of puree, thin liquids via cup including large consecutive sips as well as whole barium tablet with thin liquids. At this time, pt's risk of aspiration is minimal when following general aspiration precautions. Education provided to pt on results of study as well as general aspiration precautions.   Swallow Evaluation Recommendations       SLP Diet Recommendations: Regular solids;Thin liquid   Liquid Administration via: Cup   Medication Administration: Whole meds with liquid   Supervision: Patient able to self feed   Compensations: Minimize environmental distractions;Slow rate;Small sips/bites   Postural Changes: Remain semi-upright after after feeds/meals (Comment);Seated upright at 90 degrees   Oral Care Recommendations: Oral care BID       Latrail Pounders B. Rutherford Nail, M.S., CCC-SLP, [place] Office [place] 07/10/2021,2:30 PM

## 2021-07-10 NOTE — Therapy (Signed)
[place] MAIN [facility] SERVICES [address] [place], [place], [address] Phone: [phone]   Fax:  [phone]  Speech Language Pathology Follow UP  Patient Details  Name: Crystal Gregory MRN: [id number] Date of Birth: 17-Oct-1931 Referring Provider (SLP): Zenovia Jarred   Encounter Date: 07/10/2021  Pt completed MBSS with signs of aspiration or penetration. Further ST intervention is not indicated at this time. All future appts will be cancelled.   Christophere Hillhouse B. Rutherford Nail M.S., CCC-SLP, [place] Pathologist Rehabilitation Services Office [phone]  Stormy Fabian 07/10/2021, 2:37 PM  [place] MAIN [facility] SERVICES [address] [place], [place], [address] Phone: [phone]   Fax:  [phone]   Name: Crystal Gregory MRN: [id number] Date of Birth: [birth year]

## 2021-07-13 ENCOUNTER — Encounter: Payer: Self-pay | Admitting: Pulmonary Disease

## 2021-07-13 ENCOUNTER — Ambulatory Visit (INDEPENDENT_AMBULATORY_CARE_PROVIDER_SITE_OTHER): Payer: Medicare Other | Admitting: Pulmonary Disease

## 2021-07-13 ENCOUNTER — Other Ambulatory Visit: Payer: Self-pay

## 2021-07-13 VITALS — BP 122/52 | HR 84 | Temp 97.1°F | Ht 68.0 in | Wt 112.0 lb

## 2021-07-13 DIAGNOSIS — J479 Bronchiectasis, uncomplicated: Secondary | ICD-10-CM

## 2021-07-13 DIAGNOSIS — I5189 Other ill-defined heart diseases: Secondary | ICD-10-CM

## 2021-07-13 DIAGNOSIS — J849 Interstitial pulmonary disease, unspecified: Secondary | ICD-10-CM | POA: Diagnosis not present

## 2021-07-13 DIAGNOSIS — K224 Dyskinesia of esophagus: Secondary | ICD-10-CM | POA: Diagnosis not present

## 2021-07-13 NOTE — Progress Notes (Signed)
Subjective:    Patient ID: Crystal Gregory, female    DOB: 08/06/1932, 87 y.o.   MRN: [id number]  Chief Complaint  Patient presents with   Follow-up    Pt states no concerns    PROBLEMS: History of prior ARDS 1999 Prior long term nitrofurantoin therapy Pulmonary fibrosis Bronchiectasis Mild COPD Rheumatoid arthritis (RF/CCP positive) Dysphagia with chronic silent aspiration   PT PROFILE: 87 y.o. with remote minimal smoking history (15 PY, quit 1982) initially evaluated by Crystal Gregory 03/2016 and subsequently by Crystal Gregory for pulmonary fibrosis. Pt has prior history of severe ARDS (1999) and had been on chronic nitrofurantoin for recurrent UTIs. She has been tried on 2 maintenance inhalers without discernible benefit.   DATA: CT chest 03/22/16: Generalized pulmonary hyperinflation but without bullous emphysema. Widespread bronchiectasis with bronchial wall thickening consistent with inflammatory bronchitis. Areas of pulmonary fibrosis, most pronounced in the lower lobes, where the bronchiectasis is more extensive, including areas of pulmonary lung destruction HRCT chest 06/11/16: Diffuse cylindrical and varicoid bronchiectasis throughout both lungs, most severe at the lung bases. Basilar predominant fibrotic interstitial lung disease with patchy honeycombing, with mild progression in the short interval since 03/22/2016 Spirometry 06/18/16: Mild obstruction, FEV1 1.42 liters (63%), FVC 2.43 (80%) Bronchoscopy 07/05/16: Normal airway exam. BAL negative for AFB Echocardiogram 10/07/15: LVEF 60%, Grade I DD, mild MR, RVSP estimate 35 mmHg HRCT 11/04/16: again compatible with interstitial lung disease, and considered diagnostic of usual interstitial pneumonia (UIP) from an imaging standpoint. There has been no significant progression of disease compared to the recent prior examination PFT 02/10/17: no obstruction, normal TLC, moderate reduction in DLCO 6MWT 03/11/17: 345 m. No  desaturation PFTs 11/22/17: No obstruction.  Lung volumes normal.  DLCO moderately reduced (55% predicted). [place] since 02/10/17 PFTs 11/23/18 : FVC: 2.67 L (95 %pred), FEV1: 2.35 L (112 %pred), FEV1/FVC: 88%, TLC: 4.98 L (88 %pred), DLCO 42 %pred 2D echo 11/16/2019:Left ventricular ejection fraction, by estimation, is 60 to 65%. Left ventricular diastolic parameters were normal.Right ventricular systolic function is normal. The right ventricular size is normal. Mildly elevated pulmonary artery systolic pressure. The estimated right ventricular systolic pressure is 09.2 mmHg. Left atrial size was mildly dilated.Tricuspid valve regurgitation is mild to moderate CT chest 02/15/2020:The appearance of the lungs is compatible with interstitial lung disease, with a spectrum of findings considered diagnostic of usual interstitial pneumonia (UIP) per current ATS guidelines. Minimal progression compared to the prior examination. Barium swallow study 03/05/2020: Tertiary contractions of the esophagus mild spasm, one episode of tracheal aspiration noted without cough reflex, mild GERD. Overnight pulse oximetry 08/18/2020: No evidence of significant nocturnal desaturation. PFTs 08/31/2020: FEV1 2.16 L or 102% predicted, FVC 2.30 L or 80% predicted, FEV1 FVC 94% predicted, no bronchodilator response.  TLC 4.46 L or 78% predicted, DLCO 37% predicted. QuantiFERON gold 03/12/2021: Negative. CT high-res 03/24/2021: Progressively worsening interstitial lung disease likely UIP.  Markedly worsening bronchiectasis in the right lower lobe with acute airspace consolidation.  Pneumonia considered.  Trace partially loculated right pleural effusion. PFTs 03/25/2021: FEV1 1.38 L or 66% predicted, FVC 1.90 L or 67% predicted FEV1/FVC 73% no bronchodilator response.  Air-trapping noted.  Overall worsening on FEV1 from prior likely related to acute illness diffusion capacity severely reduced. Scleroderma panel 04/01/2021:  Negative Chest CT without contrast 05/05/2021: No significant change in pulmonary fibrosis bronchiectasis and areas of honeycombing at the lung bases.  Are consistent with UIP.  The previously noted infiltrate has resolved. PFTs 06/02/2021: FEV1 1.59 L or  76% predicted, FVC 2.08 L or 74% predicted, FEV1/FVC 76%, no bronchodilator response.  Lung volumes normal, improvement from prior PFTs noted.  Diffusion capacity severely reduced however improved from prior. 2D echocardiogram 07/09/2021: LVEF 60 to 65%, grade 2 DD, mildly elevated pulmonary artery systolic pressure, RV systolic pressure 77.9 mmHg mild aortic sclerosis without stenosis.  Stable from prior. Swallow evaluation 07/10/2021: Normal swallow function.  No laryngeal penetration or tracheal aspiration.   INTERVAL: Last encounter was 10 June 2021, at that time she was doing relatively well and was observing antireflux measures any further episode of aspiration.  At that encounter she has seen Crystal. Zenovia Gregory, GI at [facility] in [place].  No role for upper endoscopy noted at that time.  HPI Crystal Gregory presents today for follow-up particularly after testing performed which was ordered during her last visit.  She had a swallow evaluation on the seventh of tolerated that showed normal swallow function.  Previously we have determined that she does have possible presbyesophagus and reflux.  She is following antireflux measures carefully.  He was instructed by GI to take her pantoprazole before meals and not after meals and she feels that this is helping her.  She did have also a 2D echo to reevaluate her pulmonary hypertension this remains in mild degree.  She does have grade 2 diastolic dysfunction.  With regards to cough and dyspnea these are at baseline.  Cough is actually relatively well controlled since she started being more careful with antireflux measures.  Her dyspnea is mostly related to when she "hurries".  She is taking care of of an  ill husband and this does tax her some.  She does not endorse any other new symptomatology.   Review of Systems A 10 point review of systems was performed and it is as noted above otherwise negative.  Patient Active Problem List   Diagnosis Date Noted   B12 deficiency 09/02/2020   Anemia 08/06/2020   Lower extremity edema 03/06/2018   TMJ (temporomandibular joint syndrome) 03/02/2018   RA (rheumatoid arthritis) ([place]) 07/13/2017   Polyarthralgia 04/25/2017   ILD (interstitial lung disease) ([place]) 06/18/2016   IPF (idiopathic pulmonary fibrosis) ([place]) 03/23/2016   Bronchiectasis without complication ([place]) 39/12/90   Allergic rhinitis 03/22/2016   Anal fissure 08/18/2015   Insomnia 08/18/2015   Osteoporosis 02/13/2015   Stress due to illness of family member 09/16/2014   Hiatal hernia 10/13/2012   GERD (gastroesophageal reflux disease)    Allergy    Social History   Tobacco Use   Smoking status: Former    Packs/day: 0.25    Years: 20.00    Pack years: 5.00    Types: Cigarettes    Quit date: 01/16/1981    Years since quitting: 40.5   Smokeless tobacco: Never  Substance Use Topics   Alcohol use: Yes    Comment: 2 drinks daily    No Known Allergies Current Meds  Medication Sig   Albuterol Sulfate, sensor, (PROAIR DIGIHALER) 108 (90 Base) MCG/ACT AEPB Inhale 2 puffs into the lungs every 6 (six) hours.   azaTHIOprine (IMURAN) 50 MG tablet Take 75 mg every morning and 50 mg every evening.   denosumab (PROLIA) 60 MG/ML SOLN injection Inject 60 mg into the skin every 6 (six) months. Administer in upper arm, thigh, or abdomen   gabapentin (NEURONTIN) 100 MG capsule Take 1 capsule (100 mg total) by mouth at bedtime.   Multiple Minerals-Vitamins (CALCIUM & VIT D3 BONE HEALTH PO) Take 1 Dose by  mouth daily.   predniSONE (DELTASONE) 1 MG tablet Take 2 tablets (2 mg total) by mouth daily with breakfast.   propranolol (INDERAL) 10 MG tablet Take 1 tablet (10 mg total) by mouth 2 (two)  times daily. For tremor.   traZODone (DESYREL) 50 MG tablet TAKE 1 TABLET AT BEDTIME FOR SLEEP   vitamin B-12 (CYANOCOBALAMIN) 500 MCG tablet Take 500 mcg by mouth daily.   Immunization History  Administered Date(s) Administered   Fluad Quad(high Dose 65+) 07/24/2019   Influenza Split 06/04/2012   Influenza, High Dose Seasonal PF 09/10/2016   Influenza,inj,Quad PF,6+ Mos 07/04/2013, 08/18/2015, 07/13/2017, 06/19/2018   Influenza-Unspecified 07/15/2020   Moderna SARS-COV2 Booster Vaccination 05/19/2020, 02/19/2021   Moderna Sars-Covid-2 Vaccination 10/22/2019, 11/19/2019   Pneumococcal Conjugate-13 10/06/2011   Pneumococcal Polysaccharide-23 06/19/2018   Zoster, Live 01/14/2007       Objective:   Physical Exam BP (!) 122/52 (BP Location: Left Arm, Patient Position: Sitting, Cuff Size: Normal)   Pulse 84   Temp (!) 97.1 F (36.2 C) (Oral)   Ht 5\' 8"  (1.727 m)   Wt 112 lb (50.8 kg)   SpO2 98%   BMI 17.03 kg/m  GENERAL: Chronically ill-appearing, thin well-developed woman in no acute distress.  Fully ambulatory.  She is actually quite spry.  She is chronically pale. HEAD: Normocephalic, atraumatic. EYES: Pupils equal, round, reactive to light.  No scleral icterus. MOUTH: Nose/mouth/throat not examined due to masking requirements for COVID 19. NECK: Supple. No thyromegaly. Trachea midline. No JVD.  No adenopathy. PULMONARY: Good air entry bilaterally.  Coarse breath sounds at the bases, faint dry crackles right greater than left. CARDIOVASCULAR: S1 and S2. Regular rate and rhythm.  No rubs, murmurs or gallops heard.   ABDOMEN: Nondistended, scaphoid.   MUSCULOSKELETAL: No joint deformity, no clubbing, no edema. NEUROLOGIC: No overt focal deficit, no gait disturbance, speech is fluent. SKIN: Intact,warm,dry. PSYCH: Mood and behavior are normal.       Assessment & Plan:     ICD-10-CM   1. ILD (interstitial lung disease) (HCC)  J84.9    Multifactorial etiology Stable  physiologically    2. Bronchiectasis without complication ([place])  I10.3    Uses flutter valve As needed albuterol for secretion clearance Azithromycin MWF No complication    3. Esophageal dysmotility  K22.4    This issue adds complexity to her management Observing antireflux measures On PPI twice daily    4. Grade II diastolic dysfunction  U13.14    This issue adds complexity to her management May add to her sensation of dyspnea     We discussed healthy recent data with the patient.  She had no questions.  We will see her in follow-up in 3 months time she is to contact us prior to that time should any new difficulties arise.  She is set to get her flu vaccination at [facility] later this week.  Renold Don, MD Advanced Bronchoscopy PCCM [facility]    *This note was dictated using voice recognition software/Dragon.  Despite best efforts to proofread, errors can occur which can change the meaning.  Any change was purely unintentional.

## 2021-07-13 NOTE — Patient Instructions (Signed)
Good to see you doing well today.  We will see him in follow-up in 3 months time call sooner should any new problems arise.

## 2021-07-14 ENCOUNTER — Ambulatory Visit: Payer: Medicare Other | Admitting: Speech Pathology

## 2021-07-16 ENCOUNTER — Ambulatory Visit: Payer: Medicare Other | Admitting: Speech Pathology

## 2021-07-16 DIAGNOSIS — Z23 Encounter for immunization: Secondary | ICD-10-CM | POA: Diagnosis not present

## 2021-07-21 ENCOUNTER — Ambulatory Visit: Payer: Medicare Other | Admitting: Speech Pathology

## 2021-07-23 ENCOUNTER — Encounter: Payer: Medicare Other | Admitting: Speech Pathology

## 2021-07-28 ENCOUNTER — Encounter: Payer: Medicare Other | Admitting: Speech Pathology

## 2021-07-29 ENCOUNTER — Other Ambulatory Visit: Payer: Self-pay | Admitting: Physician Assistant

## 2021-07-29 NOTE — Telephone Encounter (Signed)
Next Visit: 09/11/2021  Last Visit: 06/12/2021  Last Fill: 05/15/2021  DX: Seropositive rheumatoid arthritis   Current Dose per office note 06/12/2021:  Imuran 75 mg every morning and 50 mg in the evening  Labs: 06/12/2021, CMP WNL.  Hgb and hct remain low but are stable.  We will continue to monitor lab work every 3 months.   Okay to refill Imuran?

## 2021-07-30 ENCOUNTER — Encounter: Payer: Medicare Other | Admitting: Speech Pathology

## 2021-08-04 ENCOUNTER — Encounter: Payer: Medicare Other | Admitting: Speech Pathology

## 2021-08-11 ENCOUNTER — Encounter: Payer: Medicare Other | Admitting: Speech Pathology

## 2021-08-13 ENCOUNTER — Encounter: Payer: Medicare Other | Admitting: Speech Pathology

## 2021-08-18 ENCOUNTER — Encounter: Payer: Medicare Other | Admitting: Speech Pathology

## 2021-08-20 ENCOUNTER — Encounter: Payer: Medicare Other | Admitting: Speech Pathology

## 2021-08-25 ENCOUNTER — Encounter: Payer: Medicare Other | Admitting: Speech Pathology

## 2021-08-29 ENCOUNTER — Encounter: Payer: Self-pay | Admitting: Rheumatology

## 2021-08-31 MED ORDER — PREDNISONE 1 MG PO TABS: 2.0000 mg | ORAL_TABLET | Freq: Every day | ORAL | 0 refills | Status: DC

## 2021-08-31 NOTE — Telephone Encounter (Signed)
Next Visit: 09/11/2021   Last Visit: 06/12/2021   Last Fill: 03/06/2021  DX: Seropositive rheumatoid arthritis    Current Dose per office note 06/12/2021: Prednisone 2 mg p.o. daily  Okay to refill Prednisone?

## 2021-08-31 NOTE — Progress Notes (Deleted)
Office Visit Note  Patient: Crystal Gregory             Date of Birth: [birth year]           MRN: [id number]             PCP: Pleas Koch, NP Referring: Pleas Koch, NP Visit Date: 09/11/2021 Occupation: @GUAROCC @  Subjective:  No chief complaint on file.   History of Present Illness: Crystal Gregory is a 87 y.o. female ***   Activities of Daily Living:  Patient reports morning stiffness for *** {minute/hour:19697}.   Patient {ACTIONS;DENIES/REPORTS:21021675::"Denies"} nocturnal pain.  Difficulty dressing/grooming: {ACTIONS;DENIES/REPORTS:21021675::"Denies"} Difficulty climbing stairs: {ACTIONS;DENIES/REPORTS:21021675::"Denies"} Difficulty getting out of chair: {ACTIONS;DENIES/REPORTS:21021675::"Denies"} Difficulty using hands for taps, buttons, cutlery, and/or writing: {ACTIONS;DENIES/REPORTS:21021675::"Denies"}  No Rheumatology ROS completed.   PMFS History:  Patient Active Problem List   Diagnosis Date Noted   B12 deficiency 09/02/2020   Anemia 08/06/2020   Lower extremity edema 03/06/2018   TMJ (temporomandibular joint syndrome) 03/02/2018   RA (rheumatoid arthritis) ([place]) 07/13/2017   Polyarthralgia 04/25/2017   ILD (interstitial lung disease) ([place]) 06/18/2016   IPF (idiopathic pulmonary fibrosis) ([place]) 03/23/2016   Bronchiectasis without complication ([place]) 73/41/9379   Allergic rhinitis 03/22/2016   Anal fissure 08/18/2015   Insomnia 08/18/2015   Osteoporosis 02/13/2015   Stress due to illness of family member 09/16/2014   Hiatal hernia 10/13/2012   GERD (gastroesophageal reflux disease)    Allergy     Past Medical History:  Diagnosis Date   Allergy    Anal fissure    Aspiration into airway    CAP (community acquired pneumonia) 08/23/2017   Chest pain    a. 09/04/2018 MV: EF >65%. No ischemia/infarct.   Coronary artery calcification seen on CT scan    Diastolic dysfunction    a. 10/2016 Echo: EF 60-65%, no rwma, Gr1 DD, mild MR, nl RV fxn,  mild to mod TR, PASP 40mmHg.   Diverticulosis    Dyspnea    GERD (gastroesophageal reflux disease)    Hemorrhoids    Hyperplastic colon polyp    Interstitial lung disorders ([place])    Pulmonary fibrosis ([place])    Septicemia ([place]) 1999   spent 4 weeks in ICU, intubated -? PNA    Family History  Problem Relation Age of Onset   Parkinson's disease Mother    Colon polyps Mother 47           Ulcers Father    Rheumatic fever Father    Macular degeneration Sister    Healthy Son    Healthy Daughter    Past Surgical History:  Procedure Laterality Date   ABDOMINAL HYSTERECTOMY     FLEXIBLE BRONCHOSCOPY N/A 07/05/2016   Procedure: FLEXIBLE BRONCHOSCOPY;  Surgeon: Vilinda Boehringer, MD;  Location: [facility] ORS;  Service: Cardiopulmonary;  Laterality: N/A;   Social History   Social History Narrative   Married.     2 children.  Lives with her husband in [place].   Immunization History  Administered Date(s) Administered   Fluad Quad(high Dose 65+) 07/24/2019   Influenza Split 06/04/2012   Influenza, High Dose Seasonal PF 09/10/2016   Influenza,inj,Quad PF,6+ Mos 07/04/2013, 08/18/2015, 07/13/2017, 06/19/2018   Influenza-Unspecified 07/15/2020   Moderna SARS-COV2 Booster Vaccination 05/19/2020, 02/19/2021   Moderna Sars-Covid-2 Vaccination 10/22/2019, 11/19/2019   Pneumococcal Conjugate-13 10/06/2011   Pneumococcal Polysaccharide-23 06/19/2018   Zoster, Live 01/14/2007     Objective: Vital Signs: There were no vitals taken for this visit.   Physical Exam  Musculoskeletal Exam: ***  CDAI Exam: CDAI Score: -- Patient Global: --; Provider Global: -- Swollen: --; Tender: -- Joint Exam 09/11/2021   No joint exam has been documented for this visit   There is currently no information documented on the homunculus. Go to the Rheumatology activity and complete the homunculus joint exam.  Investigation: No additional findings.  Imaging: No results found.  Recent Labs: Lab Results   Component Value Date   WBC 5.9 07/02/2021   HGB 10.3 (L) 07/02/2021   PLT 341 07/02/2021   NA 136 06/12/2021   K 4.5 06/12/2021   CL 100 06/12/2021   CO2 28 06/12/2021   GLUCOSE 85 06/12/2021   BUN 17 06/12/2021   CREATININE 0.73 06/12/2021   BILITOT 0.6 06/12/2021   ALKPHOS 39 12/30/2020   AST 15 06/12/2021   ALT 6 06/12/2021   PROT 6.7 06/12/2021   ALBUMIN 3.9 12/30/2020   CALCIUM 9.1 06/12/2021   GFRAA 91 03/06/2021   QFTBGOLDPLUS NEGATIVE 03/06/2021    Speciality Comments: Plaquenil and leflunomide-inadequate response Imuran 125 mg p.o. daily started May 2019.  We discussed switching to CellCept which she declined.  Procedures:  No procedures performed Allergies: Patient has no known allergies.   Assessment / Plan:     Visit Diagnoses: No diagnosis found.  Orders: No orders of the defined types were placed in this encounter.  No orders of the defined types were placed in this encounter.   Face-to-face time spent with patient was *** minutes. Greater than 50% of time was spent in counseling and coordination of care.  Follow-Up Instructions: No follow-ups on file.   Earnestine Mealing, CMA  Note - This record has been created using Editor, commissioning.  Chart creation errors have been sought, but may not always  have been located. Such creation errors do not reflect on  the standard of medical care.

## 2021-09-01 ENCOUNTER — Encounter: Payer: Medicare Other | Admitting: Speech Pathology

## 2021-09-03 ENCOUNTER — Encounter: Payer: Medicare Other | Admitting: Speech Pathology

## 2021-09-11 ENCOUNTER — Ambulatory Visit: Payer: Medicare Other | Admitting: Rheumatology

## 2021-10-01 NOTE — Progress Notes (Signed)
Office Visit Note  Patient: Crystal Gregory             Date of Birth: [birth year]           MRN: [id number]             PCP: Pleas Koch, NP Referring: Pleas Koch, NP Visit Date: 10/15/2021 Occupation: @GUAROCC @  Subjective:  Thoracic pain.   History of Present Illness: Crystal Gregory is a 87 y.o. female with a history of rheumatoid arthritis, interstitial lung disease.  She states she has been doing well on Imuran without any increased joint pain or swelling.  She has been followed by Dr. Patsey Berthold on a regular basis.  She states recently she has been experiencing increased thoracic pain and lumbar pain.  She was evaluated by Dr. Patsey Berthold who did x-rays of her thoracic and lumbar spine which revealed a new T11 compression fracture.  She is scheduled for MRI of her thoracic spine this evening.  She has been having severe pain and discomfort in her thoracic spine off and on.  She denies any history of joint swelling.  Activities of Daily Living:  Patient reports morning stiffness for 0 minute.   Patient Denies nocturnal pain.  Difficulty dressing/grooming: Denies Difficulty climbing stairs: Denies Difficulty getting out of chair: Denies Difficulty using hands for taps, buttons, cutlery, and/or writing: Denies  Review of Systems  Constitutional:  Positive for fatigue.  HENT:  Negative for mouth sores and mouth dryness.   Eyes:  Negative for dryness.  Respiratory:  Positive for shortness of breath.   Cardiovascular:  Negative for chest pain and palpitations.  Gastrointestinal:  Negative for blood in stool, constipation and diarrhea.  Endocrine: Positive for increased urination.  Genitourinary:  Positive for nocturia.  Musculoskeletal:  Positive for joint pain, joint pain, myalgias and myalgias.  Skin:  Negative for color change, rash and sensitivity to sunlight.  Allergic/Immunologic: Positive for susceptible to infections.  Neurological:  Negative for headaches.   Hematological:  Positive for swollen glands.  Psychiatric/Behavioral:  Negative for depressed mood and sleep disturbance. The patient is not nervous/anxious.    PMFS History:  Patient Active Problem List   Diagnosis Date Noted   B12 deficiency 09/02/2020   Anemia 08/06/2020   Lower extremity edema 03/06/2018   TMJ (temporomandibular joint syndrome) 03/02/2018   RA (rheumatoid arthritis) ([place]) 07/13/2017   Polyarthralgia 04/25/2017   ILD (interstitial lung disease) ([place]) 06/18/2016   IPF (idiopathic pulmonary fibrosis) ([place]) 03/23/2016   Bronchiectasis without complication ([place]) 32/99/2426   Allergic rhinitis 03/22/2016   Anal fissure 08/18/2015   Insomnia 08/18/2015   Osteoporosis 02/13/2015   Stress due to illness of family member 09/16/2014   Hiatal hernia 10/13/2012   GERD (gastroesophageal reflux disease)    Allergy     Past Medical History:  Diagnosis Date   Allergy    Anal fissure    Aspiration into airway    CAP (community acquired pneumonia) 08/23/2017   Chest pain    a. 09/04/2018 MV: EF >65%. No ischemia/infarct.   Coronary artery calcification seen on CT scan    Diastolic dysfunction    a. 10/2016 Echo: EF 60-65%, no rwma, Gr1 DD, mild MR, nl RV fxn, mild to mod TR, PASP 31mmHg.   Diverticulosis    Dyspnea    GERD (gastroesophageal reflux disease)    Hemorrhoids    Hyperplastic colon polyp    Interstitial lung disorders ([place])    Pulmonary fibrosis ([place])  Septicemia ([place]) 1999   spent 4 weeks in ICU, intubated -? PNA   Vertebral fracture, pathological     Family History  Problem Relation Age of Onset   Parkinson's disease Mother    Colon polyps Mother 50           Ulcers Father    Rheumatic fever Father    Macular degeneration Sister    Healthy Son    Healthy Daughter    Past Surgical History:  Procedure Laterality Date   ABDOMINAL HYSTERECTOMY     FLEXIBLE BRONCHOSCOPY N/A 07/05/2016   Procedure: FLEXIBLE BRONCHOSCOPY;  Surgeon: Vilinda Boehringer,  MD;  Location: [facility] ORS;  Service: Cardiopulmonary;  Laterality: N/A;   Social History   Social History Narrative   Married.     2 children.  Lives with her husband in [place].   Immunization History  Administered Date(s) Administered   Fluad Quad(high Dose 65+) 07/24/2019   Influenza Split 06/04/2012   Influenza, High Dose Seasonal PF 09/10/2016   Influenza,inj,Quad PF,6+ Mos 07/04/2013, 08/18/2015, 07/13/2017, 06/19/2018   Influenza-Unspecified 07/15/2020   Moderna SARS-COV2 Booster Vaccination 05/19/2020, 02/19/2021   Moderna Sars-Covid-2 Vaccination 10/22/2019, 11/19/2019   Pneumococcal Conjugate-13 10/06/2011   Pneumococcal Polysaccharide-23 06/19/2018   Zoster, Live 01/14/2007     Objective: Vital Signs: BP 121/65 (BP Location: Left Arm, Patient Position: Sitting, Cuff Size: Small)    Pulse 89    Resp 12    Ht 5\' 8"  (1.727 m)    Wt 109 lb (49.4 kg)    BMI 16.57 kg/m    Physical Exam Vitals and nursing note reviewed.  Constitutional:      Appearance: She is well-developed.  HENT:     Head: Normocephalic and atraumatic.  Eyes:     Conjunctiva/sclera: Conjunctivae normal.  Cardiovascular:     Rate and Rhythm: Normal rate and regular rhythm.     Heart sounds: Normal heart sounds.  Pulmonary:     Effort: Pulmonary effort is normal.     Breath sounds: Normal breath sounds.  Abdominal:     General: Bowel sounds are normal.     Palpations: Abdomen is soft.  Musculoskeletal:     Cervical back: Normal range of motion.  Lymphadenopathy:     Cervical: No cervical adenopathy.  Skin:    General: Skin is warm and dry.     Capillary Refill: Capillary refill takes less than 2 seconds.  Neurological:     Mental Status: She is alert and oriented to person, place, and time.  Psychiatric:        Behavior: Behavior normal.     Musculoskeletal Exam: She had limited range of motion of cervical spine with some discomfort.  She has thoracic kyphosis and discomfort in thoracic  region.  Shoulder joints, elbow joints, wrist joints with good range of motion.  PIP and DIP thickening with no MCP swelling or synovitis was noted.  Hip joints and knee joints with good range of motion.  She had no tenderness over ankles or MTPs.  CDAI Exam: CDAI Score: -- Patient Global: --; Provider Global: -- Swollen: --; Tender: -- Joint Exam 10/15/2021   No joint exam has been documented for this visit   There is currently no information documented on the homunculus. Go to the Rheumatology activity and complete the homunculus joint exam.  Investigation: No additional findings.  Imaging: DG Thoracic Spine 2 View  Addendum Date: 10/13/2021   ADDENDUM REPORT: 10/13/2021 18:43 ADDENDUM: Better seen on contemporaneous lumbar spine  radiographs, there is moderate to high-grade height loss of the anterior aspect of the T11 vertebral body, new from 05/05/2021. Recommend correlation for point tenderness. Electronically Signed   By: Yvonne Kendall   On: 10/13/2021 18:43   Result Date: 10/13/2021 CLINICAL DATA:  Back pain.  Rheumatoid arthritis. EXAM: THORACIC SPINE 2 VIEWS COMPARISON:  CT chest 05/05/2021, chest two views 11/23/2018 FINDINGS: Minimal dextrocurvature centered at T7-8, similar to prior. Minimal levocurvature centered at T12-L1, similar to prior. Mild-to-moderate multilevel disc space narrowing. Diffuse decreased bone mineralization. Within this limitation, no definite new vertebral body height loss. Mild multilevel chronic vertebral body endplate scalloping. Moderate C5-6 disc space narrowing. Moderate calcification within the aortic arch. Extensive costochondral calcifications. Moderate interlobular septal thickening within the bilateral lung bases, similar to prior. IMPRESSION: Mild-to-moderate degenerative disc changes of the thoracic spine with mild S shaped scoliotic curvature. Electronically Signed: By: Yvonne Kendall On: 10/13/2021 18:40   DG Lumbar Spine 2-3 Views  Result  Date: 10/13/2021 CLINICAL DATA:  Back pain.  Rheumatoid arthritis. EXAM: LUMBAR SPINE - 2-3 VIEW COMPARISON:  CT chest 05/05/2021. FINDINGS: There is diffuse decreased bone mineralization. There are 5 non-rib-bearing lumbar-type vertebral bodies. Partial lumbarization of S1. There is 5 mm grade 1 anterolisthesis of L5 on S1. Lucency suggesting chronic bilateral L5 pars defects. 2 mm retrolisthesis of L3 on L4. Moderate to high-grade approximate 50-60% height loss of the T11 vertebral body. This appears new compared to 05/05/2021 CT. IMPRESSION: There is moderate to high-grade height loss of the anterior aspect of the T11 vertebral body, new from 05/05/2021. Recommend correlation for point tenderness. Electronically Signed   By: Yvonne Kendall   On: 10/13/2021 18:43    Recent Labs: Lab Results  Component Value Date   WBC 5.9 07/02/2021   HGB 10.3 (L) 07/02/2021   PLT 341 07/02/2021   NA 136 06/12/2021   K 4.5 06/12/2021   CL 100 06/12/2021   CO2 28 06/12/2021   GLUCOSE 85 06/12/2021   BUN 17 06/12/2021   CREATININE 0.73 06/12/2021   BILITOT 0.6 06/12/2021   ALKPHOS 39 12/30/2020   AST 15 06/12/2021   ALT 6 06/12/2021   PROT 6.7 06/12/2021   ALBUMIN 3.9 12/30/2020   CALCIUM 9.1 06/12/2021   GFRAA 91 03/06/2021   QFTBGOLDPLUS NEGATIVE 03/06/2021    Speciality Comments: Plaquenil and leflunomide-inadequate response Imuran 125 mg p.o. daily started May 2019.  We discussed switching to CellCept which she declined.  Procedures:  No procedures performed Allergies: Patient has no known allergies.   Assessment / Plan:     Visit Diagnoses: Seropositive rheumatoid arthritis ([place]) - dxd 2018 by Dr. Meda Coffee.  On Imuran since 2019. RF+, anti-CCP+: She had no synovitis on my examination.  High risk medication use - Imuran 75 mg every morning and 50 mg in the evening since 2019.  She remains on Prednisone 2 mg p.o. daily.  Last labs from September 2022 were stable.  Need for getting labs every 3  months was emphasized.  We will obtain labs today.  Information regarding immunization was placed in the AVS.  She was also advised to stop Imuran in case if she develops an infection and resume after the infection resolves.  ILD (interstitial lung disease) ([place]) - Followed by Dr. Patsey Berthold.She had an appointment with Dr. Patsey Berthold on 06/10/2021.  Her most recent chest CT was on 05/05/2021: According the patient the patient she was told that the findings were stable.  IPF (idiopathic pulmonary fibrosis) ([place]) -  History of ARDS from nitrofurantoin.   Bronchiectasis without complication ([facility])-she is off and on increased cough.  She denies any history of fever.  TMJ (temporomandibular joint syndrome) - Left TMJ discomfort.  Resolved.  Age-related osteoporosis without current pathological fracture - January 22, 2020 DEXA the BMD measured at Forearm Radius 33% is 0.604 g/cm2 with a T-score-3.1.  She is on Prolia by her PCP.  Compression fracture of T11 vertebra with routine healing, subsequent encounter-patient is required a new vertebral fracture.  She will be getting MRI today.  She is on Prolia currently.  She may need Forteo or Tymlos.  History of gastroesophageal reflux (GERD) - History of esophageal motility.  She is on PPIs.    Hiatal hernia  B12 deficiency  Orders: Orders Placed This Encounter  Procedures   CBC with Differential/Platelet   COMPLETE METABOLIC PANEL WITH GFR   No orders of the defined types were placed in this encounter.    Follow-Up Instructions: Return in about 5 months (around 03/15/2022) for Rheumatoid arthritis, ILD.   Bo Merino, MD  Note - This record has been created using Editor, commissioning.  Chart creation errors have been sought, but may not always  have been located. Such creation errors do not reflect on  the standard of medical care.

## 2021-10-13 ENCOUNTER — Ambulatory Visit
Admission: RE | Admit: 2021-10-13 | Discharge: 2021-10-13 | Disposition: A | Discharge status: Home / Self Care | Payer: Medicare Other | Attending: Pulmonary Disease | Admitting: Pulmonary Disease

## 2021-10-13 ENCOUNTER — Ambulatory Visit (INDEPENDENT_AMBULATORY_CARE_PROVIDER_SITE_OTHER): Payer: Medicare Other | Admitting: Pulmonary Disease

## 2021-10-13 ENCOUNTER — Encounter: Payer: Self-pay | Admitting: Pulmonary Disease

## 2021-10-13 ENCOUNTER — Other Ambulatory Visit: Payer: Self-pay

## 2021-10-13 ENCOUNTER — Other Ambulatory Visit: Payer: Self-pay | Admitting: Pulmonary Disease

## 2021-10-13 ENCOUNTER — Encounter: Payer: Self-pay | Admitting: Oncology

## 2021-10-13 ENCOUNTER — Ambulatory Visit
Admission: RE | Admit: 2021-10-13 | Discharge: 2021-10-13 | Disposition: A | Discharge status: Home / Self Care | Payer: Medicare Other | Source: Ambulatory Visit | Attending: Pulmonary Disease | Admitting: Pulmonary Disease

## 2021-10-13 VITALS — BP 110/60 | HR 81 | Temp 97.1°F | Ht 68.0 in | Wt 108.8 lb

## 2021-10-13 DIAGNOSIS — M546 Pain in thoracic spine: Secondary | ICD-10-CM | POA: Insufficient documentation

## 2021-10-13 DIAGNOSIS — J849 Interstitial pulmonary disease, unspecified: Secondary | ICD-10-CM

## 2021-10-13 DIAGNOSIS — M545 Low back pain, unspecified: Secondary | ICD-10-CM

## 2021-10-13 DIAGNOSIS — K224 Dyskinesia of esophagus: Secondary | ICD-10-CM

## 2021-10-13 DIAGNOSIS — J479 Bronchiectasis, uncomplicated: Secondary | ICD-10-CM

## 2021-10-13 DIAGNOSIS — I5189 Other ill-defined heart diseases: Secondary | ICD-10-CM

## 2021-10-13 NOTE — Progress Notes (Signed)
Subjective:    Patient ID: Crystal Gregory, female    DOB: 12-Nov-1931, 88 y.o.   MRN: [id number] Chief Complaint  Patient presents with   Follow-up    ILD   PROBLEMS: History of prior ARDS 1999 Prior long term nitrofurantoin therapy Pulmonary fibrosis Bronchiectasis Mild COPD Rheumatoid arthritis (RF/CCP positive) Dysphagia with chronic silent aspiration   PT PROFILE: 88 y.o. with remote minimal smoking history (15 PY, quit 1982) initially evaluated by Dr Stevenson Clinch 03/2016 and subsequently by Dr Chase Caller for pulmonary fibrosis. Pt has prior history of severe ARDS (1999) and had been on chronic nitrofurantoin for recurrent UTIs. She has been tried on 2 maintenance inhalers without discernible benefit.   DATA: CT chest 03/22/16: Generalized pulmonary hyperinflation but without bullous emphysema. Widespread bronchiectasis with bronchial wall thickening consistent with inflammatory bronchitis. Areas of pulmonary fibrosis, most pronounced in the lower lobes, where the bronchiectasis is more extensive, including areas of pulmonary lung destruction HRCT chest 06/11/16: Diffuse cylindrical and varicoid bronchiectasis throughout both lungs, most severe at the lung bases. Basilar predominant fibrotic interstitial lung disease with patchy honeycombing, with mild progression in the short interval since 03/22/2016 Spirometry 06/18/16: Mild obstruction, FEV1 1.42 liters (63%), FVC 2.43 (80%) Bronchoscopy 07/05/16: Normal airway exam. BAL negative for AFB Echocardiogram 10/07/15: LVEF 60%, Grade I DD, mild MR, RVSP estimate 35 mmHg HRCT 11/04/16: again compatible with interstitial lung disease, and considered diagnostic of usual interstitial pneumonia (UIP) from an imaging standpoint. There has been no significant progression of disease compared to the recent prior examination PFT 02/10/17: no obstruction, normal TLC, moderate reduction in DLCO 6MWT 03/11/17: 345 m. No desaturation PFTs 11/22/17: No  obstruction.  Lung volumes normal.  DLCO moderately reduced (55% predicted). [place] since 02/10/17 PFTs 11/23/18 : FVC: 2.67 L (95 %pred), FEV1: 2.35 L (112 %pred), FEV1/FVC: 88%, TLC: 4.98 L (88 %pred), DLCO 42 %pred 2D echo 11/16/2019:Left ventricular ejection fraction, by estimation, is 60 to 65%. Left ventricular diastolic parameters were normal.Right ventricular systolic function is normal. The right ventricular size is normal. Mildly elevated pulmonary artery systolic pressure. The estimated right ventricular systolic pressure is 85.4 mmHg. Left atrial size was mildly dilated.Tricuspid valve regurgitation is mild to moderate CT chest 02/15/2020:The appearance of the lungs is compatible with interstitial lung disease, with a spectrum of findings considered diagnostic of usual interstitial pneumonia (UIP) per current ATS guidelines. Minimal progression compared to the prior examination. Barium swallow study 03/05/2020: Tertiary contractions of the esophagus mild spasm, one episode of tracheal aspiration noted without cough reflex, mild GERD. Overnight pulse oximetry 08/18/2020: No evidence of significant nocturnal desaturation. PFTs 08/31/2020: FEV1 2.16 L or 102% predicted, FVC 2.30 L or 80% predicted, FEV1 FVC 94% predicted, no bronchodilator response.  TLC 4.46 L or 78% predicted, DLCO 37% predicted. QuantiFERON gold 03/12/2021: Negative. CT high-res 03/24/2021: Progressively worsening interstitial lung disease likely UIP.  Markedly worsening bronchiectasis in the right lower lobe with acute airspace consolidation.  Pneumonia considered.  Trace partially loculated right pleural effusion. PFTs 03/25/2021: FEV1 1.38 L or 66% predicted, FVC 1.90 L or 67% predicted FEV1/FVC 73% no bronchodilator response.  Air-trapping noted.  Overall worsening on FEV1 from prior likely related to acute illness diffusion capacity severely reduced. Scleroderma panel 04/01/2021: Negative Chest CT without contrast  05/05/2021: No significant change in pulmonary fibrosis bronchiectasis and areas of honeycombing at the lung bases.  Are consistent with UIP.  The previously noted infiltrate has resolved. PFTs 06/02/2021: FEV1 1.59 L or 76% predicted, FVC 2.08 L  or 74% predicted, FEV1/FVC 76%, no bronchodilator response.  Lung volumes normal, improvement from prior PFTs noted.  Diffusion capacity severely reduced however improved from prior. 2D echocardiogram 07/09/2021: LVEF 60 to 65%, grade 2 DD, mildly elevated pulmonary artery systolic pressure, RV systolic pressure 97.0 mmHg mild aortic sclerosis without stenosis.  Stable from prior. Swallow evaluation 07/10/2021: Normal swallow function.  No laryngeal penetration or tracheal aspiration.   INTERVAL: Last encounter was 10 October r 2022, at that time she was doing relatively well and was observing antireflux measures without any further episode of aspiration.    HPI Crystal Gregory presents today for a scheduled follow-up of the issues noted above.  It has been determined that she does have possible presbyesophagus and reflux.  She is following antireflux measures carefully.  He was instructed by GI to take her pantoprazole before meals and not after meals and she feels that this is helping her.  Previously she had she did have also a 2D echo to reevaluate her pulmonary hypertension this remains in mild degree.  She does have grade 2 diastolic dysfunction.   With regards to cough and dyspnea these are at baseline, no worsening.  Cough is actually relatively well controlled since she started being more careful with antireflux measures.  Her dyspnea is mostly related to when she "hurries" but not when she is performing activities of daily living and "paces herself".  She is taking care of of an ill husband and this does tax her some.  This has been especially burdensome for her over the last several months because of his stays in the hospital and rehab.  She has had some  thoracolumbar pain of late.  Recall that she does have rheumatoid arthritis.  Pain is not associated with loss of use of limbs or with incontinence.  She does not endorse any other new symptomatology.   Review of Systems A 10 point review of systems was performed and it is as noted above otherwise negative.  Patient Active Problem List   Diagnosis Date Noted   B12 deficiency 09/02/2020   Anemia 08/06/2020   Lower extremity edema 03/06/2018   TMJ (temporomandibular joint syndrome) 03/02/2018   RA (rheumatoid arthritis) ([place]) 07/13/2017   Polyarthralgia 04/25/2017   ILD (interstitial lung disease) ([place]) 06/18/2016   IPF (idiopathic pulmonary fibrosis) ([place]) 03/23/2016   Bronchiectasis without complication ([place]) 26/37/8588   Allergic rhinitis 03/22/2016   Anal fissure 08/18/2015   Insomnia 08/18/2015   Osteoporosis 02/13/2015   Stress due to illness of family member 09/16/2014   Hiatal hernia 10/13/2012   GERD (gastroesophageal reflux disease)    Allergy    Social History   Tobacco Use   Smoking status: Former    Packs/day: 0.25    Years: 20.00    Pack years: 5.00    Types: Cigarettes    Quit date: 01/16/1981    Years since quitting: 40.7   Smokeless tobacco: Never  Substance Use Topics   Alcohol use: Yes    Comment: 2 drinks daily    No Known Allergies Current Meds  Medication Sig   Albuterol Sulfate, sensor, (PROAIR DIGIHALER) 108 (90 Base) MCG/ACT AEPB Inhale 2 puffs into the lungs every 6 (six) hours.   azaTHIOprine (IMURAN) 50 MG tablet TAKE ONE AND ONE-HALF TABLETS (75 MG) EVERY MORNING AND 1 TABLET (50 MG) EVERY EVENING   denosumab (PROLIA) 60 MG/ML SOLN injection Inject 60 mg into the skin every 6 (six) months. Administer in upper arm, thigh, or abdomen  gabapentin (NEURONTIN) 100 MG capsule Take 1 capsule (100 mg total) by mouth at bedtime.   montelukast (SINGULAIR) 10 MG tablet Take 1 tablet (10 mg total) by mouth daily.   Multiple Minerals-Vitamins (CALCIUM &  VIT D3 BONE HEALTH PO) Take 1 Dose by mouth daily.   predniSONE (DELTASONE) 1 MG tablet Take 2 tablets (2 mg total) by mouth daily with breakfast.   propranolol (INDERAL) 10 MG tablet Take 1 tablet (10 mg total) by mouth 2 (two) times daily. For tremor.   traZODone (DESYREL) 50 MG tablet TAKE 1 TABLET AT BEDTIME FOR SLEEP   vitamin B-12 (CYANOCOBALAMIN) 500 MCG tablet Take 500 mcg by mouth daily.   Immunization History  Administered Date(s) Administered   Fluad Quad(high Dose 65+) 07/24/2019   Influenza Split 06/04/2012   Influenza, High Dose Seasonal PF 09/10/2016   Influenza,inj,Quad PF,6+ Mos 07/04/2013, 08/18/2015, 07/13/2017, 06/19/2018   Influenza-Unspecified 07/15/2020   Moderna SARS-COV2 Booster Vaccination 05/19/2020, 02/19/2021   Moderna Sars-Covid-2 Vaccination 10/22/2019, 11/19/2019   Pneumococcal Conjugate-13 10/06/2011   Pneumococcal Polysaccharide-23 06/19/2018   Zoster, Live 01/14/2007       Objective:   Physical Exam BP 110/60 (BP Location: Left Arm, Patient Position: Sitting, Cuff Size: Normal)    Pulse 81    Temp (!) 97.1 F (36.2 C) (Oral)    Ht 5\' 8"  (1.727 m)    Wt 108 lb 12.8 oz (49.4 kg)    SpO2 97%    BMI 16.54 kg/m  GENERAL: Chronically ill-appearing, thin well-developed woman in no acute distress.  Fully ambulatory.  She is actually quite spry.  She is chronically pale. HEAD: Normocephalic, atraumatic. EYES: Pupils equal, round, reactive to light.  No scleral icterus. MOUTH: Nose/mouth/throat not examined due to masking requirements for COVID 19. NECK: Supple. No thyromegaly. Trachea midline. No JVD.  No adenopathy. PULMONARY: Good air entry bilaterally.  Coarse breath sounds at the bases, faint dry crackles right greater than left. CARDIOVASCULAR: S1 and S2. Regular rate and rhythm.  No rubs, murmurs or gallops heard.   ABDOMEN: Nondistended, scaphoid.   MUSCULOSKELETAL: No joint deformity, no clubbing, no edema. NEUROLOGIC: No overt focal deficit, no  gait disturbance, speech is fluent. SKIN: Intact,warm,dry. PSYCH: Mood and behavior are normal.      Assessment & Plan:     ICD-10-CM   1. ILD (interstitial lung disease) ([place])  J84.9    Multifactorial etiology Well compensated clinically    2. Bronchiectasis without complication ([place])  D78.2    She uses flutter valve and follows pulmonary toilet Continue albuterol as needed for secretion mobilization Continue azithromycin MWF No recent flare     3. Esophageal dysmotility  K22.4    Antireflux measures Small more frequent meals PPI    4. Grade II diastolic dysfunction  U23.53    This issue adds complexity to her management May add to her sensation of dyspnea    5. Back pain of thoracolumbar region  M54.50 DG Thoracic Spine 2 View   M54.6 CANCELED: DG Lumbar Spine 1 View   Obtain thoracic/lumbar films Query compression fracture     Orders Placed This Encounter  Procedures   DG Thoracic Spine 2 View    Standing Status:   Future    Number of Occurrences:   1    Standing Expiration Date:   04/12/2022    Order Specific Question:   Reason for Exam (SYMPTOM  OR DIAGNOSIS REQUIRED)    Answer:   Back pain, RA    Order  Specific Question:   Preferred imaging location?    Answer:   [facility]   We will review her thoracolumbar x-rays when available.  If a compression fracture is seen we will order MRI and Ortho or IR evaluation for kyphoplasty if needed.  We will see her in follow-up in 2 months time she is to contact us prior to that time should any new difficulties arise.  Renold Don, MD Advanced Bronchoscopy PCCM [facility]    *This note was dictated using voice recognition software/Dragon.  Despite best efforts to proofread, errors can occur which can change the meaning. Any transcriptional errors that result from this process are unintentional and may not be fully corrected at the time of dictation.

## 2021-10-13 NOTE — Patient Instructions (Signed)
We have ordered some x-rays of the back to see if you have a compression fracture.  Continue using your current medications.  We will see you in follow-up in 2 months time call sooner should any new problems arise

## 2021-10-14 ENCOUNTER — Other Ambulatory Visit: Payer: Self-pay

## 2021-10-14 ENCOUNTER — Encounter: Payer: Self-pay | Admitting: Oncology

## 2021-10-14 DIAGNOSIS — S22080A Wedge compression fracture of T11-T12 vertebra, initial encounter for closed fracture: Secondary | ICD-10-CM

## 2021-10-15 ENCOUNTER — Ambulatory Visit (INDEPENDENT_AMBULATORY_CARE_PROVIDER_SITE_OTHER): Payer: Medicare Other | Admitting: Rheumatology

## 2021-10-15 ENCOUNTER — Encounter: Payer: Self-pay | Admitting: Rheumatology

## 2021-10-15 ENCOUNTER — Ambulatory Visit
Admission: RE | Admit: 2021-10-15 | Discharge: 2021-10-15 | Disposition: A | Discharge status: Home / Self Care | Payer: Medicare Other | Source: Ambulatory Visit | Attending: Pulmonary Disease | Admitting: Pulmonary Disease

## 2021-10-15 ENCOUNTER — Other Ambulatory Visit: Payer: Self-pay

## 2021-10-15 VITALS — BP 121/65 | HR 89 | Resp 12 | Ht 68.0 in | Wt 109.0 lb

## 2021-10-15 DIAGNOSIS — E538 Deficiency of other specified B group vitamins: Secondary | ICD-10-CM

## 2021-10-15 DIAGNOSIS — M810 Age-related osteoporosis without current pathological fracture: Secondary | ICD-10-CM | POA: Diagnosis not present

## 2021-10-15 DIAGNOSIS — Z8739 Personal history of other diseases of the musculoskeletal system and connective tissue: Secondary | ICD-10-CM | POA: Diagnosis not present

## 2021-10-15 DIAGNOSIS — J479 Bronchiectasis, uncomplicated: Secondary | ICD-10-CM

## 2021-10-15 DIAGNOSIS — S22080A Wedge compression fracture of T11-T12 vertebra, initial encounter for closed fracture: Secondary | ICD-10-CM | POA: Insufficient documentation

## 2021-10-15 DIAGNOSIS — Z8719 Personal history of other diseases of the digestive system: Secondary | ICD-10-CM | POA: Diagnosis not present

## 2021-10-15 DIAGNOSIS — S22080D Wedge compression fracture of T11-T12 vertebra, subsequent encounter for fracture with routine healing: Secondary | ICD-10-CM

## 2021-10-15 DIAGNOSIS — J84112 Idiopathic pulmonary fibrosis: Secondary | ICD-10-CM | POA: Diagnosis not present

## 2021-10-15 DIAGNOSIS — M059 Rheumatoid arthritis with rheumatoid factor, unspecified: Secondary | ICD-10-CM | POA: Diagnosis not present

## 2021-10-15 DIAGNOSIS — Z79899 Other long term (current) drug therapy: Secondary | ICD-10-CM

## 2021-10-15 DIAGNOSIS — J849 Interstitial pulmonary disease, unspecified: Secondary | ICD-10-CM

## 2021-10-15 DIAGNOSIS — K449 Diaphragmatic hernia without obstruction or gangrene: Secondary | ICD-10-CM | POA: Diagnosis not present

## 2021-10-15 DIAGNOSIS — S22000A Wedge compression fracture of unspecified thoracic vertebra, initial encounter for closed fracture: Secondary | ICD-10-CM | POA: Diagnosis not present

## 2021-10-15 DIAGNOSIS — J90 Pleural effusion, not elsewhere classified: Secondary | ICD-10-CM | POA: Diagnosis not present

## 2021-10-15 DIAGNOSIS — M26609 Unspecified temporomandibular joint disorder, unspecified side: Secondary | ICD-10-CM | POA: Diagnosis not present

## 2021-10-15 LAB — COMPLETE METABOLIC PANEL WITH GFR
AG Ratio: 1.1 (calc) (ref 1.0–2.5)
ALT: 6 U/L (ref 6–29)
AST: 14 U/L (ref 10–35)
Albumin: 3.7 g/dL (ref 3.6–5.1)
Alkaline phosphatase (APISO): 48 U/L (ref 37–153)
BUN: 19 mg/dL (ref 7–25)
CO2: 28 mmol/L (ref 20–32)
Calcium: 9.1 mg/dL (ref 8.6–10.4)
Chloride: 99 mmol/L (ref 98–110)
Creat: 0.69 mg/dL (ref 0.60–0.95)
Globulin: 3.4 g/dL (calc) (ref 1.9–3.7)
Glucose, Bld: 94 mg/dL (ref 65–99)
Potassium: 5 mmol/L (ref 3.5–5.3)
Sodium: 135 mmol/L (ref 135–146)
Total Bilirubin: 0.8 mg/dL (ref 0.2–1.2)
Total Protein: 7.1 g/dL (ref 6.1–8.1)
eGFR: 84 mL/min/{1.73_m2} (ref >=60)

## 2021-10-15 LAB — CBC WITH DIFFERENTIAL/PLATELET
Absolute Monocytes: 516 cells/uL (ref 200–950)
Basophils Absolute: 29 cells/uL (ref 0–200)
Basophils Relative: 0.5 %
Eosinophils Absolute: 41 cells/uL (ref 15–500)
Eosinophils Relative: 0.7 %
HCT: 30.3 % — ABNORMAL LOW (ref 35.0–45.0)
Hemoglobin: 10.2 g/dL — ABNORMAL LOW (ref 11.7–15.5)
Lymphs Abs: 1601 cells/uL (ref 850–3900)
MCH: 38.5 pg — ABNORMAL HIGH (ref 27.0–33.0)
MCHC: 33.7 g/dL (ref 32.0–36.0)
MCV: 114.3 fL — ABNORMAL HIGH (ref 80.0–100.0)
MPV: 8.8 fL (ref 7.5–12.5)
Monocytes Relative: 8.9 %
Neutro Abs: 3613 cells/uL (ref 1500–7800)
Neutrophils Relative %: 62.3 %
Platelets: 369 10*3/uL (ref 140–400)
RBC: 2.65 10*6/uL — ABNORMAL LOW (ref 3.80–5.10)
RDW: 16.2 % — ABNORMAL HIGH (ref 11.0–15.0)
Total Lymphocyte: 27.6 %
WBC: 5.8 10*3/uL (ref 3.8–10.8)

## 2021-10-15 NOTE — Patient Instructions (Signed)
Standing Labs We placed an order today for your standing lab work.   Please have your standing labs drawn in April and every 3 months  If possible, please have your labs drawn 2 weeks prior to your appointment so that the provider can discuss your results at your appointment.  Please note that you may see your imaging and lab results in [place] before we have reviewed them. We may be awaiting multiple results to interpret others before contacting you. Please allow our office up to 72 hours to thoroughly review all of the results before contacting the office for clarification of your results.  We have open lab daily: Monday through Thursday from 1:30-4:30 PM and Friday from 1:30-4:00 PM at the office of Dr. Bo Merino, [facility].   Please be advised, all patients with office appointments requiring lab work will take precedent over walk-in lab work.  If possible, please come for your lab work on Monday and Friday afternoons, as you may experience shorter wait times. The office is located at [address] [place], [place], [place] [address] No appointment is necessary.   Labs are drawn by [facility]. Please bring your co-pay at the time of your lab draw.  You may receive a bill from [facility] for your lab work.  If you wish to have your labs drawn at another location, please call the office 24 hours in advance to send orders.  If you have any questions regarding directions or hours of operation,  please call [phone].   As a reminder, please drink plenty of water prior to coming for your lab work. Thanks!   Vaccines You are taking a medication(s) that can suppress your immune system.  The following immunizations are recommended: Flu annually Covid-19  Td/Tdap (tetanus, diphtheria, pertussis) every 10 years Pneumonia (Prevnar 15 then Pneumovax 23 at least 1 year apart.  Alternatively, can take Prevnar 20 without needing additional dose) Shingrix: 2 doses from 4 weeks  to 6 months apart  Please check with your PCP to make sure you are up to date.   If you have signs or symptoms of an infection or start antibiotics: First, call your PCP for workup of your infection. Hold your medication through the infection, until you complete your antibiotics, and until symptoms resolve if you take the following: Injectable medication (Actemra, Benlysta, Cimzia, Cosentyx, Enbrel, Humira, Kevzara, Orencia, Remicade, Simponi, Stelara, Taltz, Tremfya) Methotrexate Leflunomide (Arava) Mycophenolate (Cellcept) Morrie Sheldon, Olumiant, or Rinvoq

## 2021-10-16 ENCOUNTER — Other Ambulatory Visit: Payer: Self-pay

## 2021-10-16 DIAGNOSIS — S22080A Wedge compression fracture of T11-T12 vertebra, initial encounter for closed fracture: Secondary | ICD-10-CM

## 2021-10-20 ENCOUNTER — Telehealth: Payer: Self-pay | Admitting: Pulmonary Disease

## 2021-10-20 DIAGNOSIS — S22080A Wedge compression fracture of T11-T12 vertebra, initial encounter for closed fracture: Secondary | ICD-10-CM

## 2021-10-20 NOTE — Telephone Encounter (Signed)
Per Dr. Patsey Berthold verbally-- refer to IR for Kyphoplasty for T11 fracture. Ortho appointment is too far out.  Patient is aware. Referral has been placed. Nothing further needed.

## 2021-10-22 ENCOUNTER — Telehealth: Payer: Self-pay

## 2021-10-22 NOTE — Telephone Encounter (Signed)
Benefit verification submitted-pending. Next injection due after 11/22/21  Crystal Gregory, patient had recent MRI per chart that shows compression fracture and she is been set up with IR for Kyphoplasty for T11 fracture. Patient is not due for Prolia until next month. Is it ok to proceed with the injection at that time or need to wait?

## 2021-10-22 NOTE — Telephone Encounter (Signed)
Noted. Patient will be scheduled for Prolia when she is due which is after 11/22/21. Will be reaching out to the patient at that time.

## 2021-10-22 NOTE — Telephone Encounter (Signed)
Belenda Cruise, by reviewing her DEXA scan I cannot tell if she had improvement in her BMD or not.  If she had significant improvement in her BMD you do not need to change treatment.  She can continue Prolia lifelong.  The only other option will be to switch her to [facility] which is a daily and injection and compliance could be an issue.  Most patients continue to do well on Prolia.  Considering her age probably continuing Prolia will be a good choice.

## 2021-10-22 NOTE — Telephone Encounter (Signed)
Dr. Estanislado Pandy,  I reviewed your recent office notes regarding this patients osteoporosis. Do you believe that we should continue Prolia or do you plan on initiating another treatment?  Thanks! Allie Bossier, NP-C

## 2021-10-22 NOTE — Telephone Encounter (Addendum)
Thank you!   Crystal Gregory, I don't see where she's scheduled.  You can schedule her for the Prolia injection as we do not know when she will be undergoing the procedure.  Also the compression fracture looks to be subacute to chronic.  From a quick review of the literature, she is at higher risk for vertebral fracture if Prolia is delayed or discontinued.

## 2021-10-23 ENCOUNTER — Encounter: Payer: Self-pay | Admitting: Oncology

## 2021-10-28 NOTE — Telephone Encounter (Signed)
OOP cost is $0 Labs were done on 10/15/21. Nurse visit scheduled for 11/24/21 CrCl is 44.8 mL/min Calcium normal at 9.1

## 2021-10-30 ENCOUNTER — Encounter: Payer: Self-pay | Admitting: Pulmonary Disease

## 2021-10-30 NOTE — Telephone Encounter (Signed)
IR referral placed  Kyphoplasty on 10/20/2021.  Patient has not been contacted.   Rodena Piety, please advise. Thanks

## 2021-11-02 ENCOUNTER — Other Ambulatory Visit: Payer: Self-pay | Admitting: Pulmonary Disease

## 2021-11-02 ENCOUNTER — Other Ambulatory Visit: Payer: Self-pay

## 2021-11-02 DIAGNOSIS — S22080A Wedge compression fracture of T11-T12 vertebra, initial encounter for closed fracture: Secondary | ICD-10-CM

## 2021-11-05 ENCOUNTER — Telehealth: Payer: Self-pay | Admitting: Pulmonary Disease

## 2021-11-05 ENCOUNTER — Other Ambulatory Visit: Payer: Self-pay | Admitting: Pulmonary Disease

## 2021-11-05 ENCOUNTER — Encounter: Payer: Self-pay | Admitting: Oncology

## 2021-11-05 ENCOUNTER — Ambulatory Visit
Admission: RE | Admit: 2021-11-05 | Discharge: 2021-11-05 | Disposition: A | Discharge status: Home / Self Care | Payer: Medicare Other | Source: Ambulatory Visit | Attending: Pulmonary Disease | Admitting: Pulmonary Disease

## 2021-11-05 DIAGNOSIS — S22080A Wedge compression fracture of T11-T12 vertebra, initial encounter for closed fracture: Secondary | ICD-10-CM

## 2021-11-05 DIAGNOSIS — S22082A Unstable burst fracture of T11-T12 vertebra, initial encounter for closed fracture: Secondary | ICD-10-CM | POA: Diagnosis not present

## 2021-11-05 HISTORY — PX: IR RADIOLOGIST EVAL & MGMT: IMG5224

## 2021-11-05 NOTE — Telephone Encounter (Signed)
Received below message form Dr. Patsey Berthold via epic secure chat.  IR was unable to do a kyphoplasty for her because they believe that it more chronic compression fracture.  They suggested physical therapy.  I recommend that she contact her rheumatologist office to see if they can provide a regimen for her physical therapy wise.  Patient is aware of recommendations and voiced her understanding.  Nothing further needed at this time.

## 2021-11-05 NOTE — H&P (Signed)
Interventional Radiology - Clinic Visit, Initial H&P    Referring Provider (current admission): Tyler Pita, MD  Reason for Visit: T11 compression fracture     History of Present Illness  Crystal Gregory is a 88 y.o. female with a relevant past medical history of OA, RA and pulmonary fibrosis seen today in Interventional Radiology clinic for T11 compression fracture.  She states a gradual onset of mid back pain that started about 5 months ago. It has become more bothersome recently, and she related being unable to perform any significant lifting or prolonged standing without back pain exacerbating. The pain is not daily, but she is unable to give an exact frequency. The pain is usually worst on days when she is more physiucally active. The pain is better lying down and in the mornings. She take tylenol intermittently for pain, with maybe only marginal benefit. She has been advised by her rheumatologist to avoid NSAIDs as she on prednisone maintenance for her RA.   MRI Jan 2023 earlier this month showed T11 compression fracture that appeared more chronic in nature.    Additional Past Medical History Past Medical History:  Diagnosis Date   Allergy    Anal fissure    Aspiration into airway    CAP (community acquired pneumonia) 08/23/2017   Chest pain    a. 09/04/2018 MV: EF >65%. No ischemia/infarct.   Coronary artery calcification seen on CT scan    Diastolic dysfunction    a. 10/2016 Echo: EF 60-65%, no rwma, Gr1 DD, mild MR, nl RV fxn, mild to mod TR, PASP 25mmHg.   Diverticulosis    Dyspnea    GERD (gastroesophageal reflux disease)    Hemorrhoids    Hyperplastic colon polyp    Interstitial lung disorders ([place])    Pulmonary fibrosis ([place])    Septicemia ([place]) 1999   spent 4 weeks in ICU, intubated -? PNA   Vertebral fracture, pathological      Surgical History  Past Surgical History:  Procedure Laterality Date   ABDOMINAL HYSTERECTOMY     FLEXIBLE BRONCHOSCOPY N/A  07/05/2016   Procedure: FLEXIBLE BRONCHOSCOPY;  Surgeon: Vilinda Boehringer, MD;  Location: [facility] ORS;  Service: Cardiopulmonary;  Laterality: N/A;     Medications  I have reviewed the current medication list. Refer to chart for details. Current Outpatient Medications  Medication Instructions   Albuterol Sulfate, sensor, (PROAIR DIGIHALER) 108 (90 Base) MCG/ACT AEPB 2 puffs, Inhalation, Every 6 hours   azaTHIOprine (IMURAN) 50 MG tablet TAKE ONE AND ONE-HALF TABLETS (75 MG) EVERY MORNING AND 1 TABLET (50 MG) EVERY EVENING   denosumab (PROLIA) 60 mg, Subcutaneous, Every 6 months, Administer in upper arm, thigh, or abdomen    gabapentin (NEURONTIN) 100 mg, Oral, Daily at bedtime   montelukast (SINGULAIR) 10 mg, Oral, Daily   Multiple Minerals-Vitamins (CALCIUM & VIT D3 BONE HEALTH PO) 1 Dose, Oral, Daily   predniSONE (DELTASONE) 2 mg, Oral, Daily with breakfast   propranolol (INDERAL) 10 mg, Oral, 2 times daily, For tremor.   traZODone (DESYREL) 50 MG tablet TAKE 1 TABLET AT BEDTIME FOR SLEEP   vitamin B-12 (CYANOCOBALAMIN) 500 mcg, Oral, Daily      Allergies No Known Allergies Does patient have contrast allergy: No     Physical Exam Current Vitals Temp: 97.9 F (36.6 C) ( )   Pulse Rate: 88   Resp: 16   BP: 132/63   SpO2: 98 %           There is no  height or weight on file to calculate BMI.  General: Alert and answers questions appropriately. No apparent distress. HEENT: Normocephalic, atraumatic. Cardiac: Regular rate. No dependent edema. Pulmonary: Normal work of breathing. On room air. Abdominal: Soft. Extremities: Normally-formed, well perfused.  Back: No midline TTP.    Pertinent Lab Results CBC Latest Ref Rng & Units 10/15/2021 07/02/2021 06/12/2021  WBC 3.8 - 10.8 Thousand/uL 5.8 5.9 6.0  Hemoglobin 11.7 - 15.5 g/dL 10.2(L) 10.3(L) 10.2(L)  Hematocrit 35.0 - 45.0 % 30.3(L) 31.5(L) 29.7(L)  Platelets 140 - 400 Thousand/uL 369 341 349   CMP Latest Ref Rng & Units 10/15/2021  06/12/2021 03/06/2021  Glucose 65 - 99 mg/dL 94 85 94  BUN 7 - 25 mg/dL 19 17 19   Creatinine 0.60 - 0.95 mg/dL 0.69 0.73 0.68  Sodium 135 - 146 mmol/L 135 136 137  Potassium 3.5 - 5.3 mmol/L 5.0 4.5 4.6  Chloride 98 - 110 mmol/L 99 100 101  CO2 20 - 32 mmol/L 28 28 27   Calcium 8.6 - 10.4 mg/dL 9.1 9.1 9.3  Total Protein 6.1 - 8.1 g/dL 7.1 6.7 7.1  Total Bilirubin 0.2 - 1.2 mg/dL 0.8 0.6 0.7  Alkaline Phos 38 - 126 U/L - - -  AST 10 - 35 U/L 14 15 15   ALT 6 - 29 U/L 6 6 7       Relevant and/or Recent Imaging: MRI Jan 2023    Assessment & Plan Crystal Gregory is a 88 y.o. female with a relevant past medical history of OA, RA and pulmonary fibrosis seen today in Interventional Radiology clinic for T11 compression fracture.   Based on the patient's history, physical exam, and imaging, which were notable for gradual onset of back pain >3 months ago, MRI demonstrating likely chronic/healed fracture, and absence of midline tenderness on exam, I believe this is mostly a healed/chronic compression fracture that would likely not benefit from kyphoplasty at this point. I discussed this with the patient, who understood. Her back pain may be related to biomechanical alterations after her compression fracture. I suggested that physical therapy may be benefit.     I spent a total of  30 Minutes  in face-to-face in clinical consultation, greater than 50% of which was spent on medical decision-making and counseling/coordinating care for T11 compression fracture.     Albin Felling, MD  Vascular and Interventional Radiology 11/05/2021 2:42 PM

## 2021-11-24 ENCOUNTER — Encounter: Payer: Self-pay | Admitting: Rheumatology

## 2021-11-24 ENCOUNTER — Ambulatory Visit: Payer: TRICARE For Life (TFL)

## 2021-11-24 ENCOUNTER — Encounter: Payer: Self-pay | Admitting: Pulmonary Disease

## 2021-11-24 MED ORDER — AZATHIOPRINE 50 MG PO TABS: ORAL_TABLET | ORAL | 0 refills | Status: DC

## 2021-11-24 MED ORDER — MONTELUKAST SODIUM 10 MG PO TABS: 10.0000 mg | ORAL_TABLET | Freq: Every day | ORAL | 3 refills | Status: DC

## 2021-11-24 NOTE — Telephone Encounter (Signed)
Next Visit: 03/15/2022  Last Visit: 10/15/2021  Last Fill: 07/29/2021  DX: Seropositive rheumatoid arthritis  Current Dose per office note 10/15/2021:  Imuran 75 mg every morning and 50 mg in the evening since 2019  Labs: 10/15/2021 CMP WNL.  Patient remains anemic-hemoglobin and hematocrit are stable.   Okay to refill Imuran?

## 2021-11-26 ENCOUNTER — Encounter: Payer: Self-pay | Admitting: Primary Care

## 2021-11-26 ENCOUNTER — Ambulatory Visit (INDEPENDENT_AMBULATORY_CARE_PROVIDER_SITE_OTHER): Payer: Medicare Other | Admitting: Primary Care

## 2021-11-26 ENCOUNTER — Other Ambulatory Visit: Payer: Self-pay

## 2021-11-26 VITALS — BP 130/70 | HR 75 | Temp 98.6°F | Ht 68.0 in | Wt 108.0 lb

## 2021-11-26 DIAGNOSIS — M2550 Pain in unspecified joint: Secondary | ICD-10-CM

## 2021-11-26 DIAGNOSIS — G4700 Insomnia, unspecified: Secondary | ICD-10-CM

## 2021-11-26 DIAGNOSIS — Z6379 Other stressful life events affecting family and household: Secondary | ICD-10-CM | POA: Diagnosis not present

## 2021-11-26 DIAGNOSIS — R251 Tremor, unspecified: Secondary | ICD-10-CM | POA: Diagnosis not present

## 2021-11-26 DIAGNOSIS — E2839 Other primary ovarian failure: Secondary | ICD-10-CM | POA: Diagnosis not present

## 2021-11-26 DIAGNOSIS — M4854XS Collapsed vertebra, not elsewhere classified, thoracic region, sequela of fracture: Secondary | ICD-10-CM | POA: Diagnosis not present

## 2021-11-26 DIAGNOSIS — M8000XS Age-related osteoporosis with current pathological fracture, unspecified site, sequela: Secondary | ICD-10-CM | POA: Diagnosis not present

## 2021-11-26 DIAGNOSIS — K219 Gastro-esophageal reflux disease without esophagitis: Secondary | ICD-10-CM | POA: Diagnosis not present

## 2021-11-26 DIAGNOSIS — J84112 Idiopathic pulmonary fibrosis: Secondary | ICD-10-CM | POA: Diagnosis not present

## 2021-11-26 DIAGNOSIS — M4854XA Collapsed vertebra, not elsewhere classified, thoracic region, initial encounter for fracture: Secondary | ICD-10-CM | POA: Insufficient documentation

## 2021-11-26 DIAGNOSIS — M069 Rheumatoid arthritis, unspecified: Secondary | ICD-10-CM

## 2021-11-26 MED ORDER — DENOSUMAB 60 MG/ML ~~LOC~~ SOSY
60.0000 mg | PREFILLED_SYRINGE | Freq: Once | SUBCUTANEOUS | Status: AC
  Administered 2021-11-26: 60 mg via SUBCUTANEOUS

## 2021-11-26 NOTE — Patient Instructions (Signed)
My records indicate that you have been taking pantoprazole 20 mg twice daily for heartburn.  Let me know what you find at home.  You will be contacted regarding your referral to physical therpay.  Please let us know if you have not been contacted within two weeks.   [facility].  It was a pleasure to see you today!

## 2021-11-26 NOTE — Assessment & Plan Note (Signed)
Overall stable.  Continue trazodone 50 mg at bedtime.

## 2021-11-26 NOTE — Assessment & Plan Note (Signed)
Controlled. Blood pressure under good control.  Continue propanolol 10 mg twice daily.

## 2021-11-26 NOTE — Assessment & Plan Note (Signed)
Stable.  Following with rheumatology.  Office notes from rheumatology visit reviewed from January 23.  Continue Imuran 75 mg in the a.m., 50 mg in the p.m. Continue prednisone 2 mg daily.

## 2021-11-26 NOTE — Assessment & Plan Note (Signed)
Stable.  Rheumatology notes from January 23 reviewed.  Continue Imuran 75 mg in the a.m., 50 mg in the p.m. Continue prednisone 2 mg daily.  We will touch base with rheumatologist regarding osteoporosis treatment in light of recent fracture.

## 2021-11-26 NOTE — Assessment & Plan Note (Signed)
Discussed with patient today.  She denies concerns for stress/anxiety and is doing overall okay.  Continue to monitor.

## 2021-11-26 NOTE — Addendum Note (Signed)
Addended by: Francella Solian on: 11/26/2021 12:04 PM   Modules accepted: Orders

## 2021-11-26 NOTE — Progress Notes (Signed)
Subjective:    Patient ID: Crystal Gregory, female    DOB: June 30, 1932, 88 y.o.   MRN: [id number]  HPI  Crystal Gregory is a very pleasant 88 y.o. female with a history of idiopathic pulmonary fibrosis, GERD, osteoporosis, rheumatoid arthritis, insomnia, who presents today for follow-up of chronic conditions and for Prolia injection.  1) Osteoporosis: Currently managed on Prolia 60 mg semi-annually.  Last bone density scan was completed in April 2021 with T score of -3.1.  She does have a recently found T11 compression fracture which was discovered via x-ray.  She underwent MRI in January 2023 which confirmed compression deformity of T11 with approximately 50% vertebral body height loss.  She was evaluated by IR earlier this month who notified her that the fracture was healing. Kyphoplasty was not recommended given her age and history of IPF.   Her back pain is intermittent. Tolerable at times.   Evaluated by her rheumatologist in January 2023 who is considering changing her treatment regimen from Prolia to [place] or Tymlos.   2) Idiopathic Pulmonary Fibrosis: Following with pulmonology, Dr. Patsey Berthold, last visit was in September 2022.  She underwent CT chest in August 2022 which revealed stable findings.   Currently managed on gabapentin 100 mg at bedtime, singular 10 mg daily,  3) Rheumatoid Arthritis: Following with Dr. Estanislado Pandy, last office visit was in January 2023.  Currently managed on Imuran 75 mg AM and 50 mg in PM, prednisone 2 mg daily.  4) Tremor: Currently managed on propanolol 10 mg twice daily. She stopped her propanolol 2 months ago for a few days due to one low BP reading of 58'N systolic. She also noticed immediate return in her hand tremors. She resumed her propranolol a few days later. She's not seen low BP readings since.   5) Insomnia: Currently managed on trazodone 50 mg at bedtime, she uses as needed. Typically takes 4-5 times weekly. She finds this treatment effective.    6) GERD: Currently managed on PPI, she thinks pantoprazole 10 mg twice daily. She feels well managed on this regimen.   Review of Systems  Constitutional:  Positive for fatigue.  Respiratory:  Positive for cough and shortness of breath.   Cardiovascular:  Negative for chest pain.  Musculoskeletal:  Positive for arthralgias.  Psychiatric/Behavioral:  The patient is not nervous/anxious.        Some caregiver stress         Past Medical History:  Diagnosis Date   Allergy    Anal fissure    Aspiration into airway    CAP (community acquired pneumonia) 08/23/2017   Chest pain    a. 09/04/2018 MV: EF >65%. No ischemia/infarct.   Coronary artery calcification seen on CT scan    Diastolic dysfunction    a. 10/2016 Echo: EF 60-65%, no rwma, Gr1 DD, mild MR, nl RV fxn, mild to mod TR, PASP 108mmHg.   Diverticulosis    Dyspnea    GERD (gastroesophageal reflux disease)    Hemorrhoids    Hyperplastic colon polyp    Interstitial lung disorders ([place])    Pulmonary fibrosis ([place])    Septicemia ([place]) 1999   spent 4 weeks in ICU, intubated -? PNA   Vertebral fracture, pathological     Social History   Socioeconomic History   Marital status: Married    Spouse name: Not on file   Number of children: 2   Years of education: Not on file   Highest education level: Not on file  Occupational History   Occupation: Retired   Tobacco Use   Smoking status: Former    Packs/day: 0.25    Years: 20.00    Pack years: 5.00    Types: Cigarettes    Quit date: 01/16/1981    Years since quitting: 40.8   Smokeless tobacco: Never  Vaping Use   Vaping Use: Never used  Substance and Sexual Activity   Alcohol use: Yes    Alcohol/week: 7.0 standard drinks    Types: 7 Standard drinks or equivalent per week   Drug use: No   Sexual activity: Not on file  Other Topics Concern   Not on file  Social History Narrative   Married.     2 children.  Lives with her husband in [place].   Social  Determinants of Health   Financial Resource Strain: Not on file  Food Insecurity: Not on file  Transportation Needs: Not on file  Physical Activity: Not on file  Stress: Not on file  Social Connections: Not on file  Intimate Partner Violence: Not on file    Past Surgical History:  Procedure Laterality Date   ABDOMINAL HYSTERECTOMY     FLEXIBLE BRONCHOSCOPY N/A 07/05/2016   Procedure: FLEXIBLE BRONCHOSCOPY;  Surgeon: Vilinda Boehringer, MD;  Location: [facility] ORS;  Service: Cardiopulmonary;  Laterality: N/A;   IR RADIOLOGIST EVAL & MGMT  11/05/2021    Family History  Problem Relation Age of Onset   Parkinson's disease Mother    Colon polyps Mother 44           Ulcers Father    Rheumatic fever Father    Macular degeneration Sister    Healthy Son    Healthy Daughter     No Known Allergies  Current Outpatient Medications on File Prior to Visit  Medication Sig Dispense Refill   Albuterol Sulfate, sensor, (PROAIR DIGIHALER) 108 (90 Base) MCG/ACT AEPB Inhale 2 puffs into the lungs every 6 (six) hours. 1 each 0   azaTHIOprine (IMURAN) 50 MG tablet TAKE ONE AND ONE-HALF TABLETS (75 MG) EVERY MORNING AND 1 TABLET (50 MG) EVERY EVENING 225 tablet 0   denosumab (PROLIA) 60 MG/ML SOLN injection Inject 60 mg into the skin every 6 (six) months. Administer in upper arm, thigh, or abdomen     gabapentin (NEURONTIN) 100 MG capsule Take 1 capsule (100 mg total) by mouth at bedtime. 90 capsule 3   montelukast (SINGULAIR) 10 MG tablet Take 1 tablet (10 mg total) by mouth daily. 90 tablet 3   Multiple Minerals-Vitamins (CALCIUM & VIT D3 BONE HEALTH PO) Take 1 Dose by mouth daily.     predniSONE (DELTASONE) 1 MG tablet Take 2 tablets (2 mg total) by mouth daily with breakfast. 180 tablet 0   propranolol (INDERAL) 10 MG tablet Take 1 tablet (10 mg total) by mouth 2 (two) times daily. For tremor. 180 tablet 3   traZODone (DESYREL) 50 MG tablet TAKE 1 TABLET AT BEDTIME FOR SLEEP 90 tablet 3   vitamin B-12  (CYANOCOBALAMIN) 500 MCG tablet Take 500 mcg by mouth daily.     No current facility-administered medications on file prior to visit.    BP 130/70    Pulse 75    Temp 98.6 F (37 C) (Oral)    Ht 5\' 8"  (1.727 m)    Wt 108 lb (49 kg)    SpO2 98%    BMI 16.42 kg/m  Objective:   Physical Exam Cardiovascular:     Rate and Rhythm: Normal  rate and regular rhythm.  Pulmonary:     Effort: Pulmonary effort is normal.     Breath sounds: Normal breath sounds.  Musculoskeletal:     Cervical back: Neck supple.  Skin:    General: Skin is warm and dry.  Psychiatric:        Mood and Affect: Mood normal.          Assessment & Plan:      This visit occurred during the SARS-CoV-2 public health emergency.  Safety protocols were in place, including screening questions prior to the visit, additional usage of staff PPE, and extensive cleaning of exam room while observing appropriate contact time as indicated for disinfecting solutions.

## 2021-11-26 NOTE — Assessment & Plan Note (Signed)
Due for repeat bone density scan, discussed this today.  Orders placed.  Continue Prolia 60 mg injections semi-annually, injection provided today.  Reviewed rheumatology notes from January 2023 who mentions possible switching to Tylmols or Forteo. Will discus with rheumatologist.  Density scan pending.

## 2021-11-26 NOTE — Assessment & Plan Note (Signed)
Unclear which dose of pantoprazole patient is actually taking.  From chart review it appears she is taking 20 mg twice daily, however she insists she is taking 10 mg twice daily.  She will update via MyChart.  Continue what ever regimen she is taking as this is effective.

## 2021-11-26 NOTE — Assessment & Plan Note (Signed)
Overall stable according to patient.  Continue gabapentin 100 mg at bedtime for cough. Continue albuterol inhaler as needed.

## 2021-11-26 NOTE — Assessment & Plan Note (Signed)
Relatively acute.  MRI reviewed from epic  Continue with osteoporosis treatment, will consult with rheumatology.  Discussed that she could take an extra gabapentin tablet during the day pain as long as this does not cause drowsiness.  Referral placed for physical therapy.  Not a candidate for kyphoplasty.

## 2021-11-30 ENCOUNTER — Encounter: Payer: Self-pay | Admitting: Rheumatology

## 2021-11-30 MED ORDER — PREDNISONE 1 MG PO TABS: 2.0000 mg | ORAL_TABLET | Freq: Every day | ORAL | 0 refills | Status: DC

## 2021-11-30 NOTE — Telephone Encounter (Signed)
Next Visit: 03/15/2022  Last Visit: 10/15/2021  Last Fill: 08/31/2021  Dx: Seropositive rheumatoid arthritis   Current Dose per office note on 10/15/2021: Prednisone 2 mg p.o. daily  Okay to refill Prednisone?

## 2021-12-08 DIAGNOSIS — M4854XD Collapsed vertebra, not elsewhere classified, thoracic region, subsequent encounter for fracture with routine healing: Secondary | ICD-10-CM | POA: Diagnosis not present

## 2021-12-08 DIAGNOSIS — M4854XS Collapsed vertebra, not elsewhere classified, thoracic region, sequela of fracture: Secondary | ICD-10-CM | POA: Diagnosis not present

## 2021-12-10 DIAGNOSIS — M4854XS Collapsed vertebra, not elsewhere classified, thoracic region, sequela of fracture: Secondary | ICD-10-CM | POA: Diagnosis not present

## 2021-12-10 DIAGNOSIS — M4854XD Collapsed vertebra, not elsewhere classified, thoracic region, subsequent encounter for fracture with routine healing: Secondary | ICD-10-CM | POA: Diagnosis not present

## 2021-12-11 ENCOUNTER — Encounter: Payer: Self-pay | Admitting: Pulmonary Disease

## 2021-12-11 MED ORDER — AZITHROMYCIN 250 MG PO TABS: ORAL_TABLET | ORAL | 3 refills | Status: DC

## 2021-12-11 NOTE — Telephone Encounter (Signed)
ORDER FOR AZITHROMYCIN SENT, nothing further needed. ?

## 2021-12-11 NOTE — Telephone Encounter (Signed)
Dr Patsey Berthold please advise: ? ? ?Patient is requesting the following: ? ? ? ?Please send an order to [facility] for Azithromycin Tabs 250 mg.  I take 1 tablet three times a week. ?Thank you. ?Crystal Gregory   ?

## 2021-12-11 NOTE — Telephone Encounter (Signed)
She has been on this medication chronically.  Yes this may be renewed.  He times a week that is Monday Wednesday Friday.  This is a long-term medication for her bronchiectasis. ?

## 2021-12-14 ENCOUNTER — Encounter: Payer: Self-pay | Admitting: Pulmonary Disease

## 2021-12-14 ENCOUNTER — Other Ambulatory Visit: Payer: Self-pay

## 2021-12-14 ENCOUNTER — Ambulatory Visit (INDEPENDENT_AMBULATORY_CARE_PROVIDER_SITE_OTHER): Payer: Medicare Other | Admitting: Pulmonary Disease

## 2021-12-14 VITALS — BP 110/70 | HR 81 | Temp 97.0°F | Ht 68.0 in | Wt 112.4 lb

## 2021-12-14 DIAGNOSIS — I5189 Other ill-defined heart diseases: Secondary | ICD-10-CM

## 2021-12-14 DIAGNOSIS — R053 Chronic cough: Secondary | ICD-10-CM | POA: Diagnosis not present

## 2021-12-14 DIAGNOSIS — J849 Interstitial pulmonary disease, unspecified: Secondary | ICD-10-CM

## 2021-12-14 DIAGNOSIS — J479 Bronchiectasis, uncomplicated: Secondary | ICD-10-CM | POA: Diagnosis not present

## 2021-12-14 DIAGNOSIS — K224 Dyskinesia of esophagus: Secondary | ICD-10-CM

## 2021-12-14 MED ORDER — GABAPENTIN 100 MG PO CAPS: 200.0000 mg | ORAL_CAPSULE | Freq: Every day | ORAL | 3 refills | Status: DC

## 2021-12-14 NOTE — Progress Notes (Signed)
Subjective:    Patient ID: Crystal Gregory, female    DOB: 1932-06-16, 88 y.o.   MRN: [id number] Patient Care Team: Pleas Koch, NP as PCP - General (Internal Medicine) Rockey Situ Kathlene November, MD as PCP - Cardiology (Cardiology) Minna Merritts, MD as Consulting Physician (Cardiology) Bo Merino, MD as Consulting Physician (Rheumatology)  Chief Complaint  Patient presents with   Follow-up   PROBLEMS: History of prior ARDS 1999 Prior long term nitrofurantoin therapy Pulmonary fibrosis Bronchiectasis Mild COPD Rheumatoid arthritis (RF/CCP positive) Dysphagia with chronic silent aspiration   PT PROFILE: 88 y.o. with remote minimal smoking history (15 PY, quit 1982) initially evaluated by Dr Stevenson Clinch 03/2016 and subsequently by Dr Chase Caller for pulmonary fibrosis. Pt has prior history of severe ARDS (1999) and had been on chronic nitrofurantoin for recurrent UTIs. She has been tried on 2 maintenance inhalers without discernible benefit.  INTERVAL: Last encounter was 13 October 2021, at that time she was doing relatively well and was observing antireflux measures without any further episode of aspiration.   HPI Cadience presents today for a scheduled follow-up of the issues noted above.  It has been determined that she does have possible presbyesophagus and reflux.  She is following antireflux measures carefully.  He was instructed by GI to take her pantoprazole before meals and not after meals and she feels that this is helping her.  Previously she had she did have also a 2D echo to reevaluate her pulmonary hypertension this remains in mild degree.  She does have grade 2 diastolic dysfunction.   With regards to cough and dyspnea these are at baseline, no worsening.  Cough is actually relatively well controlled since she started being more careful with antireflux measures.  Her dyspnea is mostly related to when she "hurries" but not when she is performing activities of daily living  and "paces herself".  She was doing well with regards to her cough with nighttime gabapentin at 100 mg, she has noted that her cough may have gotten worse here lately.  It is nonproductive.  She does not endorse any other new symptomatology.  She is on azithromycin 3 times a week to prevent bronchiectasis flares.    DATA: CT chest 03/22/16: Generalized pulmonary hyperinflation but without bullous emphysema. Widespread bronchiectasis with bronchial wall thickening consistent with inflammatory bronchitis. Areas of pulmonary fibrosis, most pronounced in the lower lobes, where the bronchiectasis is more extensive, including areas of pulmonary lung destruction HRCT chest 06/11/16: Diffuse cylindrical and varicoid bronchiectasis throughout both lungs, most severe at the lung bases. Basilar predominant fibrotic interstitial lung disease with patchy honeycombing, with mild progression in the short interval since 03/22/2016 Spirometry 06/18/16: Mild obstruction, FEV1 1.42 liters (63%), FVC 2.43 (80%) Bronchoscopy 07/05/16: Normal airway exam. BAL negative for AFB Echocardiogram 10/07/15: LVEF 60%, Grade I DD, mild MR, RVSP estimate 35 mmHg HRCT 11/04/16: again compatible with interstitial lung disease, and considered diagnostic of usual interstitial pneumonia (UIP) from an imaging standpoint. There has been no significant progression of disease compared to the recent prior examination PFT 02/10/17: no obstruction, normal TLC, moderate reduction in DLCO 6MWT 03/11/17: 345 m. No desaturation PFTs 11/22/17: No obstruction.  Lung volumes normal.  DLCO moderately reduced (55% predicted). [place] since 02/10/17 PFTs 11/23/18 : FVC: 2.67 L (95 %pred), FEV1: 2.35 L (112 %pred), FEV1/FVC: 88%, TLC: 4.98 L (88 %pred), DLCO 42 %pred 2D echo 11/16/2019:Left ventricular ejection fraction, by estimation, is 60 to 65%. Left ventricular diastolic parameters were normal.Right ventricular systolic function  is normal. The right  ventricular size is normal. Mildly elevated pulmonary artery systolic pressure. The estimated right ventricular systolic pressure is [id number] mmHg. Left atrial size was mildly dilated.Tricuspid valve regurgitation is mild to moderate CT chest 02/15/2020:The appearance of the lungs is compatible with interstitial lung disease, with a spectrum of findings considered diagnostic of usual interstitial pneumonia (UIP) per current ATS guidelines. Minimal progression compared to the prior examination. Barium swallow study 03/05/2020: Tertiary contractions of the esophagus mild spasm, one episode of tracheal aspiration noted without cough reflex, mild GERD. Overnight pulse oximetry 08/18/2020: No evidence of significant nocturnal desaturation. PFTs 08/31/2020: FEV1 2.16 L or 102% predicted, FVC 2.30 L or 80% predicted, FEV1 FVC 94% predicted, no bronchodilator response.  TLC 4.46 L or 78% predicted, DLCO 37% predicted. QuantiFERON gold 03/12/2021: Negative. CT high-res 03/24/2021: Progressively worsening interstitial lung disease likely UIP.  Markedly worsening bronchiectasis in the right lower lobe with acute airspace consolidation.  Pneumonia considered.  Trace partially loculated right pleural effusion. PFTs 03/25/2021: FEV1 1.38 L or 66% predicted, FVC 1.90 L or 67% predicted FEV1/FVC 73% no bronchodilator response.  Air-trapping noted.  Overall worsening on FEV1 from prior likely related to acute illness diffusion capacity severely reduced. Scleroderma panel 04/01/2021: Negative Chest CT without contrast 05/05/2021: No significant change in pulmonary fibrosis bronchiectasis and areas of honeycombing at the lung bases.  Are consistent with UIP.  The previously noted infiltrate has resolved. PFTs 06/02/2021: FEV1 1.59 L or 76% predicted, FVC 2.08 L or 74% predicted, FEV1/FVC 76%, no bronchodilator response.  Lung volumes normal, improvement from prior PFTs noted.  Diffusion capacity severely reduced however improved  from prior. 2D echocardiogram 07/09/2021: LVEF 60 to 65%, grade 2 DD, mildly elevated pulmonary artery systolic pressure, RV systolic pressure 99991111 mmHg mild aortic sclerosis without stenosis.  Stable from prior. Swallow evaluation 07/10/2021: Normal swallow function.  No laryngeal penetration or tracheal aspiration.  Review of Systems A 10 point review of systems was performed and it is as noted above otherwise negative.  Patient Active Problem List   Diagnosis Date Noted   Compression fracture of thoracic spine, non-traumatic ([place]) 11/26/2021   Tremor of both hands 11/26/2021   B12 deficiency 09/02/2020   Anemia 08/06/2020   Lower extremity edema 03/06/2018   TMJ (temporomandibular joint syndrome) 03/02/2018   RA (rheumatoid arthritis) ([place]) 07/13/2017   Polyarthralgia 04/25/2017   ILD (interstitial lung disease) ([place]) 06/18/2016   IPF (idiopathic pulmonary fibrosis) ([place]) 03/23/2016   Bronchiectasis without complication ([place]) [id number]   Allergic rhinitis 03/22/2016   Anal fissure 08/18/2015   Insomnia 08/18/2015   Osteoporosis 02/13/2015   Stress due to illness of family member 09/16/2014   Hiatal hernia 10/13/2012   GERD (gastroesophageal reflux disease)    Allergy    Social History   Tobacco Use   Smoking status: Former    Packs/day: 0.25    Years: 20.00    Pack years: 5.00    Types: Cigarettes    Quit date: 01/16/1981    Years since quitting: 40.9   Smokeless tobacco: Never  Substance Use Topics   Alcohol use: Yes    Alcohol/week: 7.0 standard drinks    Types: 7 Standard drinks or equivalent per week   No Known Allergies  Current Meds  Medication Sig   Albuterol Sulfate, sensor, (PROAIR DIGIHALER) 108 (90 Base) MCG/ACT AEPB Inhale 2 puffs into the lungs every 6 (six) hours.   azaTHIOprine (IMURAN) 50 MG tablet TAKE ONE AND ONE-HALF TABLETS (75 MG)  EVERY MORNING AND 1 TABLET (50 MG) EVERY EVENING   azithromycin (ZITHROMAX) 250 MG tablet Take 1 tablet 3x a week    denosumab (PROLIA) 60 MG/ML SOLN injection Inject 60 mg into the skin every 6 (six) months. Administer in upper arm, thigh, or abdomen   gabapentin (NEURONTIN) 100 MG capsule Take 1 capsule (100 mg total) by mouth at bedtime.   montelukast (SINGULAIR) 10 MG tablet Take 1 tablet (10 mg total) by mouth daily.   Multiple Minerals-Vitamins (CALCIUM & VIT D3 BONE HEALTH PO) Take 1 Dose by mouth daily.   pantoprazole (PROTONIX) 20 MG tablet Take 20 mg by mouth 2 (two) times daily.   predniSONE (DELTASONE) 1 MG tablet Take 2 tablets (2 mg total) by mouth daily with breakfast.   propranolol (INDERAL) 10 MG tablet Take 1 tablet (10 mg total) by mouth 2 (two) times daily. For tremor.   traZODone (DESYREL) 50 MG tablet TAKE 1 TABLET AT BEDTIME FOR SLEEP   vitamin B-12 (CYANOCOBALAMIN) 500 MCG tablet Take 500 mcg by mouth daily.   Immunization History  Administered Date(s) Administered   Fluad Quad(high Dose 65+) 07/24/2019   Influenza Split 06/04/2012   Influenza, High Dose Seasonal PF 09/10/2016   Influenza,inj,Quad PF,6+ Mos 07/04/2013, 08/18/2015, 07/13/2017, 06/19/2018   Influenza-Unspecified 07/15/2020   Moderna SARS-COV2 Booster Vaccination 05/19/2020, 02/19/2021   Moderna Sars-Covid-2 Vaccination 10/22/2019, 11/19/2019   Pneumococcal Conjugate-13 10/06/2011   Pneumococcal Polysaccharide-23 06/19/2018   Zoster, Live 01/14/2007       Objective:   Physical Exam BP 110/70 (BP Location: Left Arm, Patient Position: Sitting, Cuff Size: Normal)   Pulse 81   Temp (!) 97 F (36.1 C) (Oral)   Ht '5\' 8"'$  (1.727 m)   Wt 112 lb 6.4 oz (51 kg)   SpO2 99%   BMI 17.09 kg/m   GENERAL: Chronically ill-appearing, thin well-developed woman in no acute distress.  Fully ambulatory.  She is actually quite spry.  She is chronically pale. HEAD: Normocephalic, atraumatic. EYES: Pupils equal, round, reactive to light.  No scleral icterus. MOUTH: Nose/mouth/throat not examined due to masking requirements for  COVID 19. NECK: Supple. No thyromegaly. Trachea midline. No JVD.  No adenopathy. PULMONARY: Good air entry bilaterally.  Coarse breath sounds at the bases, faint dry crackles right greater than left. CARDIOVASCULAR: S1 and S2. Regular rate and rhythm.  No rubs, murmurs or gallops heard.   ABDOMEN: Nondistended, scaphoid.   MUSCULOSKELETAL: No joint deformity, no clubbing, no edema. NEUROLOGIC: No overt focal deficit, no gait disturbance, speech is fluent. SKIN: Intact,warm,dry. PSYCH: Mood and behavior are normal.      Assessment & Plan:     ICD-10-CM   1. ILD (interstitial lung disease) ([place])  J84.9    Appears to be well compensated Multifactorial as noted above    2. Bronchiectasis without complication (HCC)  A999333    Continue Azithromycin MWF Continue pulmonary toilet Continue albuterol as needed for secretion mobilization    3. Chronic cough  R05.3    Increase gabapentin to 200 mg at at bedtime Continue antireflux measures    4. Esophageal dysmotility  K22.4    Continue antireflux measures PPI twice a day before meals as per GI    5. Grade II diastolic dysfunction  [id number]    This issue adds complexity to her management Follows with cardiology     Meds ordered this encounter  Medications   gabapentin (NEURONTIN) 100 MG capsule    Sig: Take 2 capsules (200 mg total)  by mouth at bedtime.    Dispense:  180 capsule    Refill:  3   Will see the patient in follow-up in 3 months time call sooner should any new problems arise.  Renold Don, MD Advanced Bronchoscopy PCCM [facility]    *This note was dictated using voice recognition software/Dragon.  Despite best efforts to proofread, errors can occur which can change the meaning. Any transcriptional errors that result from this process are unintentional and may not be fully corrected at the time of dictation.

## 2021-12-14 NOTE — Patient Instructions (Signed)
Increase your gabapentin to 2 capsules (total of 200 mg) at bedtime. ? ? ?Follow-up in 3 months time call sooner should any new problems arise. ?

## 2021-12-15 DIAGNOSIS — M4854XS Collapsed vertebra, not elsewhere classified, thoracic region, sequela of fracture: Secondary | ICD-10-CM | POA: Diagnosis not present

## 2021-12-15 DIAGNOSIS — M4854XD Collapsed vertebra, not elsewhere classified, thoracic region, subsequent encounter for fracture with routine healing: Secondary | ICD-10-CM | POA: Diagnosis not present

## 2021-12-15 NOTE — Telephone Encounter (Signed)
Created in error

## 2021-12-21 DIAGNOSIS — M4854XD Collapsed vertebra, not elsewhere classified, thoracic region, subsequent encounter for fracture with routine healing: Secondary | ICD-10-CM | POA: Diagnosis not present

## 2021-12-21 DIAGNOSIS — M4854XS Collapsed vertebra, not elsewhere classified, thoracic region, sequela of fracture: Secondary | ICD-10-CM | POA: Diagnosis not present

## 2021-12-22 ENCOUNTER — Other Ambulatory Visit: Payer: Self-pay | Admitting: *Deleted

## 2021-12-22 DIAGNOSIS — D638 Anemia in other chronic diseases classified elsewhere: Secondary | ICD-10-CM

## 2021-12-22 DIAGNOSIS — D539 Nutritional anemia, unspecified: Secondary | ICD-10-CM

## 2021-12-24 DIAGNOSIS — M4854XS Collapsed vertebra, not elsewhere classified, thoracic region, sequela of fracture: Secondary | ICD-10-CM | POA: Diagnosis not present

## 2021-12-24 DIAGNOSIS — M4854XD Collapsed vertebra, not elsewhere classified, thoracic region, subsequent encounter for fracture with routine healing: Secondary | ICD-10-CM | POA: Diagnosis not present

## 2021-12-29 DIAGNOSIS — M4854XD Collapsed vertebra, not elsewhere classified, thoracic region, subsequent encounter for fracture with routine healing: Secondary | ICD-10-CM | POA: Diagnosis not present

## 2021-12-29 DIAGNOSIS — M4854XS Collapsed vertebra, not elsewhere classified, thoracic region, sequela of fracture: Secondary | ICD-10-CM | POA: Diagnosis not present

## 2021-12-31 DIAGNOSIS — M4854XD Collapsed vertebra, not elsewhere classified, thoracic region, subsequent encounter for fracture with routine healing: Secondary | ICD-10-CM | POA: Diagnosis not present

## 2021-12-31 DIAGNOSIS — M4854XS Collapsed vertebra, not elsewhere classified, thoracic region, sequela of fracture: Secondary | ICD-10-CM | POA: Diagnosis not present

## 2022-01-04 ENCOUNTER — Inpatient Hospital Stay: Payer: Medicare Other | Attending: Oncology

## 2022-01-04 ENCOUNTER — Encounter: Payer: Self-pay | Admitting: Oncology

## 2022-01-04 ENCOUNTER — Inpatient Hospital Stay (HOSPITAL_BASED_OUTPATIENT_CLINIC_OR_DEPARTMENT_OTHER): Payer: Medicare Other | Admitting: Oncology

## 2022-01-04 ENCOUNTER — Other Ambulatory Visit: Payer: Self-pay | Admitting: *Deleted

## 2022-01-04 VITALS — BP 117/55 | HR 74 | Temp 97.6°F | Resp 18 | Wt 111.5 lb

## 2022-01-04 DIAGNOSIS — Z8371 Family history of colonic polyps: Secondary | ICD-10-CM | POA: Insufficient documentation

## 2022-01-04 DIAGNOSIS — M7989 Other specified soft tissue disorders: Secondary | ICD-10-CM | POA: Diagnosis not present

## 2022-01-04 DIAGNOSIS — Z7289 Other problems related to lifestyle: Secondary | ICD-10-CM | POA: Insufficient documentation

## 2022-01-04 DIAGNOSIS — D539 Nutritional anemia, unspecified: Secondary | ICD-10-CM | POA: Insufficient documentation

## 2022-01-04 DIAGNOSIS — Z83518 Family history of other specified eye disorder: Secondary | ICD-10-CM | POA: Insufficient documentation

## 2022-01-04 DIAGNOSIS — R600 Localized edema: Secondary | ICD-10-CM | POA: Diagnosis not present

## 2022-01-04 DIAGNOSIS — D638 Anemia in other chronic diseases classified elsewhere: Secondary | ICD-10-CM

## 2022-01-04 DIAGNOSIS — K219 Gastro-esophageal reflux disease without esophagitis: Secondary | ICD-10-CM | POA: Diagnosis not present

## 2022-01-04 DIAGNOSIS — Z8719 Personal history of other diseases of the digestive system: Secondary | ICD-10-CM | POA: Diagnosis not present

## 2022-01-04 DIAGNOSIS — E538 Deficiency of other specified B group vitamins: Secondary | ICD-10-CM | POA: Diagnosis not present

## 2022-01-04 DIAGNOSIS — J8410 Pulmonary fibrosis, unspecified: Secondary | ICD-10-CM | POA: Insufficient documentation

## 2022-01-04 DIAGNOSIS — Z79899 Other long term (current) drug therapy: Secondary | ICD-10-CM | POA: Diagnosis not present

## 2022-01-04 DIAGNOSIS — Z87891 Personal history of nicotine dependence: Secondary | ICD-10-CM | POA: Insufficient documentation

## 2022-01-04 DIAGNOSIS — R0602 Shortness of breath: Secondary | ICD-10-CM | POA: Diagnosis not present

## 2022-01-04 DIAGNOSIS — Z818 Family history of other mental and behavioral disorders: Secondary | ICD-10-CM | POA: Diagnosis not present

## 2022-01-04 DIAGNOSIS — M069 Rheumatoid arthritis, unspecified: Secondary | ICD-10-CM | POA: Insufficient documentation

## 2022-01-04 DIAGNOSIS — R5382 Chronic fatigue, unspecified: Secondary | ICD-10-CM | POA: Diagnosis not present

## 2022-01-04 LAB — CBC WITH DIFFERENTIAL/PLATELET
Abs Immature Granulocytes: 0.03 10*3/uL (ref 0.00–0.07)
Basophils Absolute: 0 10*3/uL (ref 0.0–0.1)
Basophils Relative: 1 %
Eosinophils Absolute: 0.1 10*3/uL (ref 0.0–0.5)
Eosinophils Relative: 1 %
HCT: 31.9 % — ABNORMAL LOW (ref 36.0–46.0)
Hemoglobin: 10.6 g/dL — ABNORMAL LOW (ref 12.0–15.0)
Immature Granulocytes: 1 %
Lymphocytes Relative: 25 %
Lymphs Abs: 1.6 10*3/uL (ref 0.7–4.0)
MCH: 38.7 pg — ABNORMAL HIGH (ref 26.0–34.0)
MCHC: 33.2 g/dL (ref 30.0–36.0)
MCV: 116.4 fL — ABNORMAL HIGH (ref 80.0–100.0)
Monocytes Absolute: 0.5 10*3/uL (ref 0.1–1.0)
Monocytes Relative: 8 %
Neutro Abs: 4.1 10*3/uL (ref 1.7–7.7)
Neutrophils Relative %: 64 %
Platelets: 313 10*3/uL (ref 150–400)
RBC: 2.74 MIL/uL — ABNORMAL LOW (ref 3.87–5.11)
RDW: 15.8 % — ABNORMAL HIGH (ref 11.5–15.5)
WBC: 6.3 10*3/uL (ref 4.0–10.5)
nRBC: 0 % (ref 0.0–0.2)

## 2022-01-04 LAB — IRON AND TIBC
Iron: 77 ug/dL (ref 28–170)
Saturation Ratios: 26 % (ref 10.4–31.8)
TIBC: 300 ug/dL (ref 250–450)
UIBC: 223 ug/dL

## 2022-01-04 LAB — VITAMIN B12: Vitamin B-12: 935 pg/mL — ABNORMAL HIGH (ref 180–914)

## 2022-01-04 LAB — FERRITIN: Ferritin: 32 ng/mL (ref 11–307)

## 2022-01-04 NOTE — Progress Notes (Signed)
? ? ? ?Hematology/Oncology Consult note ?[place]  ?[phone] [phone] ? ?Patient Care Team: ?Pleas Koch, NP as PCP - General (Internal Medicine) ?Minna Merritts, MD as PCP - Cardiology (Cardiology) ?Minna Merritts, MD as Consulting Physician (Cardiology) ?Bo Merino, MD as Consulting Physician (Rheumatology)  ? ?Name of the patient: Crystal Gregory  ?[id number]  ?Dec 15, 1931  ? ?Date of visit: 01/04/22 ? ?Diagnosis- macrocytic anemia likely anemia of chronic disease and component of B12 deficiency versus possible MDS ? ?Chief complaint/ Reason for visit-routine follow-up of anemia ? ?Heme/Onc history:  Patient is a 88 year old female referred for macrocytic anemia.  She has a prior history of rheumatoid arthritis as well as interstitial lung disease from rheumatoid arthritis as well.  She is not on home oxygen.  She has been referred for anemia.  Dating back at her CBC is her hemoglobin was always between 10-11 even back in 2018 and 2019.  Her most recent CBC from 08/06/2020 showed white count of 4.7, H&H of 9.5/28.8 with an MCV of 114 and platelet count of 276.  She has developed progressive macrocytosis since 2019.  B12 levels were mildly low at 277.  Patient reports chronic fatigue and exertional shortness of breath.   She is on Imuran for rheumatoid arthritis ?  ?Results of blood work from 08/11/2020 were as follows: CBC showed white count of 4.8, H&H of 9.9/29.6 with an MCV of 114 and a platelet count of 300.  B12 level was low at 277.  Folate and TSH was normal.  EPO level was elevated at 100.  Haptoglobin was normal.  Myeloma panel showed polyclonal increase but no monoclonal protein.  Reticulocyte count was inappropriately low for the degree of anemia at 2.2%.  CMP showed normal kidney and liver functions. ?  ? ?Interval history-patient reports swelling in her right foot which has been ongoing for the last 1 week or so which makes it difficult  for her to wear a shoe or walk.  Denies any trauma.  Denies any pain in her right ankle.  She has not had any prior surgeries in her right foot. ? ?ECOG PS- 1 ?Pain scale- 0 ? ? ?Review of systems- Review of Systems  ?Constitutional:  Positive for malaise/fatigue. Negative for chills, fever and weight loss.  ?HENT:  Negative for congestion, ear discharge and nosebleeds.   ?Eyes:  Negative for blurred vision.  ?Respiratory:  Negative for cough, hemoptysis, sputum production, shortness of breath and wheezing.   ?Cardiovascular:  Positive for leg swelling. Negative for chest pain, palpitations, orthopnea and claudication.  ?Gastrointestinal:  Negative for abdominal pain, blood in stool, constipation, diarrhea, heartburn, melena, nausea and vomiting.  ?Genitourinary:  Negative for dysuria, flank pain, frequency, hematuria and urgency.  ?Musculoskeletal:  Negative for back pain, joint pain and myalgias.  ?Skin:  Negative for rash.  ?Neurological:  Negative for dizziness, tingling, focal weakness, seizures, weakness and headaches.  ?Endo/Heme/Allergies:  Does not bruise/bleed easily.  ?Psychiatric/Behavioral:  Negative for depression and suicidal ideas. The patient does not have insomnia.    ? ? ?No Known Allergies ? ? ?Past Medical History:  ?Diagnosis Date  ? Allergy   ? Anal fissure   ? Aspiration into airway   ? CAP (community acquired pneumonia) 08/23/2017  ? Chest pain   ? a. 09/04/2018 MV: EF >65%. No ischemia/infarct.  ? Coronary artery calcification seen on CT scan   ? Diastolic dysfunction   ? a. 10/2016 Echo: EF 60-65%, no  rwma, Gr1 DD, mild MR, nl RV fxn, mild to mod TR, PASP 70mHg.  ? Diverticulosis   ? Dyspnea   ? GERD (gastroesophageal reflux disease)   ? Hemorrhoids   ? Hyperplastic colon polyp   ? Interstitial lung disorders ([facility]   ? Pulmonary fibrosis ([facility]   ? Septicemia ([facility]  ? spent 4 weeks in ICU, intubated -? PNA  ? Vertebral fracture, pathological   ? ? ? ?Past Surgical History:  ?Procedure  Laterality Date  ? ABDOMINAL HYSTERECTOMY    ? FLEXIBLE BRONCHOSCOPY N/A 07/05/2016  ? Procedure: FLEXIBLE BRONCHOSCOPY;  Surgeon: VVilinda Boehringer MD;  Location: [facility] ORS;  Service: Cardiopulmonary;  Laterality: N/A;  ? IR RADIOLOGIST EVAL & MGMT  11/05/2021  ? ? ?Social History  ? ?Socioeconomic History  ? Marital status: Married  ?  Spouse name: Not on file  ? Number of children: 2  ? Years of education: Not on file  ? Highest education level: Not on file  ?Occupational History  ? Occupation: Retired   ?Tobacco Use  ? Smoking status: Former  ?  Packs/day: 0.25  ?  Years: 20.00  ?  Pack years: 5.00  ?  Types: Cigarettes  ?  Quit date: 01/16/1981  ?  Years since quitting: 40.9  ? Smokeless tobacco: Never  ?Vaping Use  ? Vaping Use: Never used  ?Substance and Sexual Activity  ? Alcohol use: Yes  ?  Alcohol/week: 7.0 standard drinks  ?  Types: 7 Standard drinks or equivalent per week  ? Drug use: No  ? Sexual activity: Not on file  ?Other Topics Concern  ? Not on file  ?Social History Narrative  ? Married.    ? 2 children.  [place] in [place]  ? ?Social Determinants of Health  ? ?Financial Resource Strain: Not on file  ?Food Insecurity: Not on file  ?Transportation Needs: Not on file  ?Physical Activity: Not on file  ?Stress: Not on file  ?Social Connections: Not on file  ?Intimate Partner Violence: Not on file  ? ? ?Family History  ?Problem Relation Age of Onset  ? Parkinson's disease Mother   ? Colon polyps Mother 84 ?        ? Ulcers Father   ? Rheumatic fever Father   ? Macular degeneration Sister   ? Healthy Son   ? Healthy Daughter   ? ? ? ?Current Outpatient Medications:  ?  Albuterol Sulfate, sensor, (PROAIR DIGIHALER) 108 (90 Base) MCG/ACT AEPB, Inhale 2 puffs into the lungs every 6 (six) hours., Disp: 1 each, Rfl: 0 ?  azaTHIOprine (IMURAN) 50 MG tablet, TAKE ONE AND ONE-HALF TABLETS (75 MG) EVERY MORNING AND 1 TABLET (50 MG) EVERY EVENING, Disp: 225 tablet, Rfl: 0 ?  azithromycin (ZITHROMAX)  250 MG tablet, Take 1 tablet 3x a week, Disp: 30 each, Rfl: 3 ?  denosumab (PROLIA) 60 MG/ML SOLN injection, Inject 60 mg into the skin every 6 (six) months. Administer in upper arm, thigh, or abdomen, Disp: , Rfl:  ?  gabapentin (NEURONTIN) 100 MG capsule, Take 2 capsules (200 mg total) by mouth at bedtime., Disp: 180 capsule, Rfl: 3 ?  montelukast (SINGULAIR) 10 MG tablet, Take 1 tablet (10 mg total) by mouth daily., Disp: 90 tablet, Rfl: 3 ?  Multiple Minerals-Vitamins (CALCIUM & VIT D3 BONE HEALTH PO), Take 1 Dose by mouth daily., Disp: , Rfl:  ?  pantoprazole (PROTONIX) 20 MG tablet, Take 20 mg by mouth 2 (  two) times daily., Disp: , Rfl:  ?  predniSONE (DELTASONE) 1 MG tablet, Take 2 tablets (2 mg total) by mouth daily with breakfast., Disp: 180 tablet, Rfl: 0 ?  propranolol (INDERAL) 10 MG tablet, Take 1 tablet (10 mg total) by mouth 2 (two) times daily. For tremor., Disp: 180 tablet, Rfl: 3 ?  traZODone (DESYREL) 50 MG tablet, TAKE 1 TABLET AT BEDTIME FOR SLEEP, Disp: 90 tablet, Rfl: 3 ?  vitamin B-12 (CYANOCOBALAMIN) 500 MCG tablet, Take 500 mcg by mouth daily., Disp: , Rfl:  ? ?Physical exam:  ?Vitals:  ? 01/04/22 1014  ?BP: (!) 117/55  ?Pulse: 74  ?Resp: 18  ?Temp: 97.6 ?F (36.4 ?C)  ?SpO2: 99%  ?Weight: 111 lb 8 oz (50.6 kg)  ? ?Physical Exam ?Constitutional:   ?   General: She is not in acute distress. ?Cardiovascular:  ?   Rate and Rhythm: Normal rate and regular rhythm.  ?   Heart sounds: Normal heart sounds.  ?Pulmonary:  ?   Effort: Pulmonary effort is normal.  ?   Breath sounds: Normal breath sounds.  ?Abdominal:  ?   General: Bowel sounds are normal.  ?   Palpations: Abdomen is soft.  ?Musculoskeletal:  ?   Comments: Right ankle/foot is diffusely swollen as compared to left.  No right leg edema or tenderness.  ?Skin: ?   General: Skin is warm and dry.  ?Neurological:  ?   Mental Status: She is alert and oriented to person, place, and time.  ?  ? ? ?  Latest Ref Rng & Units 10/15/2021  ? 10:32 AM   ?CMP  ?Glucose 65 - 99 mg/dL 94    ?BUN 7 - 25 mg/dL 19    ?Creatinine 0.60 - 0.95 mg/dL 0.69    ?Sodium 135 - 146 mmol/L 135    ?Potassium 3.5 - 5.3 mmol/L 5.0    ?Chloride 98 - 110 mmol/L 99    ?CO2 20

## 2022-01-04 NOTE — Progress Notes (Signed)
Pt states thar her husband is now on hospice care so she's doing her best trying to take care of him which is preventing adequate sleep for the pt; pt will like advice on her left foot due to swelling; not painful; PT states it is not sprained.  ?

## 2022-01-05 DIAGNOSIS — M4854XD Collapsed vertebra, not elsewhere classified, thoracic region, subsequent encounter for fracture with routine healing: Secondary | ICD-10-CM | POA: Diagnosis not present

## 2022-01-05 DIAGNOSIS — M4854XS Collapsed vertebra, not elsewhere classified, thoracic region, sequela of fracture: Secondary | ICD-10-CM | POA: Diagnosis not present

## 2022-01-07 DIAGNOSIS — M4854XD Collapsed vertebra, not elsewhere classified, thoracic region, subsequent encounter for fracture with routine healing: Secondary | ICD-10-CM | POA: Diagnosis not present

## 2022-01-07 DIAGNOSIS — M4854XS Collapsed vertebra, not elsewhere classified, thoracic region, sequela of fracture: Secondary | ICD-10-CM | POA: Diagnosis not present

## 2022-01-12 ENCOUNTER — Ambulatory Visit
Admission: RE | Admit: 2022-01-12 | Discharge: 2022-01-12 | Disposition: A | Discharge status: Home / Self Care | Payer: Medicare Other | Source: Ambulatory Visit | Attending: Oncology | Admitting: Oncology

## 2022-01-12 DIAGNOSIS — M7121 Synovial cyst of popliteal space [Baker], right knee: Secondary | ICD-10-CM | POA: Diagnosis not present

## 2022-01-12 DIAGNOSIS — R600 Localized edema: Secondary | ICD-10-CM | POA: Insufficient documentation

## 2022-01-14 ENCOUNTER — Encounter: Payer: Self-pay | Admitting: Family

## 2022-01-14 ENCOUNTER — Ambulatory Visit (INDEPENDENT_AMBULATORY_CARE_PROVIDER_SITE_OTHER): Payer: Medicare Other | Admitting: Family

## 2022-01-14 ENCOUNTER — Ambulatory Visit (INDEPENDENT_AMBULATORY_CARE_PROVIDER_SITE_OTHER)
Admission: RE | Admit: 2022-01-14 | Discharge: 2022-01-14 | Disposition: A | Discharge status: Home / Self Care | Payer: Medicare Other | Source: Ambulatory Visit | Attending: Family | Admitting: Family

## 2022-01-14 VITALS — BP 118/62 | HR 77 | Temp 98.3°F | Resp 16 | Ht 68.0 in | Wt 110.3 lb

## 2022-01-14 DIAGNOSIS — R0609 Other forms of dyspnea: Secondary | ICD-10-CM | POA: Insufficient documentation

## 2022-01-14 DIAGNOSIS — M7121 Synovial cyst of popliteal space [Baker], right knee: Secondary | ICD-10-CM | POA: Diagnosis not present

## 2022-01-14 DIAGNOSIS — M4854XD Collapsed vertebra, not elsewhere classified, thoracic region, subsequent encounter for fracture with routine healing: Secondary | ICD-10-CM | POA: Diagnosis not present

## 2022-01-14 DIAGNOSIS — J90 Pleural effusion, not elsewhere classified: Secondary | ICD-10-CM | POA: Diagnosis not present

## 2022-01-14 DIAGNOSIS — R600 Localized edema: Secondary | ICD-10-CM | POA: Insufficient documentation

## 2022-01-14 DIAGNOSIS — I8393 Asymptomatic varicose veins of bilateral lower extremities: Secondary | ICD-10-CM

## 2022-01-14 DIAGNOSIS — J449 Chronic obstructive pulmonary disease, unspecified: Secondary | ICD-10-CM | POA: Diagnosis not present

## 2022-01-14 DIAGNOSIS — M4854XS Collapsed vertebra, not elsewhere classified, thoracic region, sequela of fracture: Secondary | ICD-10-CM | POA: Diagnosis not present

## 2022-01-14 DIAGNOSIS — J8410 Pulmonary fibrosis, unspecified: Secondary | ICD-10-CM | POA: Diagnosis not present

## 2022-01-14 LAB — BASIC METABOLIC PANEL
BUN: 22 mg/dL (ref 6–23)
CO2: 25 mEq/L (ref 19–32)
Calcium: 8.8 mg/dL (ref 8.4–10.5)
Chloride: 101 mEq/L (ref 96–112)
Creatinine, Ser: 0.67 mg/dL (ref 0.40–1.20)
GFR: 78.24 mL/min (ref >60.00)
Glucose, Bld: 168 mg/dL — ABNORMAL HIGH (ref 70–99)
Potassium: 3.8 mEq/L (ref 3.5–5.1)
Sodium: 135 mEq/L (ref 135–145)

## 2022-01-14 LAB — BRAIN NATRIURETIC PEPTIDE: Pro B Natriuretic peptide (BNP): 384 pg/mL — ABNORMAL HIGH (ref 0.0–100.0)

## 2022-01-14 MED ORDER — FUROSEMIDE 20 MG PO TABS: 20.0000 mg | ORAL_TABLET | Freq: Every day | ORAL | 0 refills | Status: DC

## 2022-01-14 NOTE — Assessment & Plan Note (Signed)
Reviewed recent venous u/s with right bakers cyst.  ?Referral to orthopedist if needed ?Elevate as needed ?

## 2022-01-14 NOTE — Progress Notes (Signed)
? ?Established Patient Office Visit ? ?Subjective:  ?Patient ID: Crystal Gregory, female    DOB: 29-Nov-1931  Age: 88 y.o. MRN: [id number] ? ?CC:  ?Chief Complaint  ?Patient presents with  ? Edema  ?  Right leg  ? ? ?HPI ?Kawena Lyday is here today with concerns.  ? ?Saw hematologist 4/3 and was seen as well with right lower edema. US venous doppler US was performed, was found to have right bakers cyst with no evidence of Dvt.  ? ?RLE with a lot of swelling, end of day feels heavy. Now noticing it a bit in her left lower extremity as well. Nonpainful. No redness or irritation. No pain with walking. She does say she was referred to vein clinic but no follow up as of yet.  ? Confirmed referral is placed 4/3 ? ?No chest pain, palpitation. Echocardiogram 10/22/ some stiffness of left main heart chamber with artery form heart to lungs under moderate pressure that is unchanged. Pulmologist did suggest this may increase sensation of sob due to heart stiffness, requiring f/u with pcp. She does have pulmonary fibrosis, which causes sob. She does notice some DOE but stable. Her husband is in hospice care and she is his full time caregiver which is causing her some distress as well.  ? EF 60-65% ? Mild aortic valve sclerosis, no lvh  ? Grade II diastolic dysfunction ? ?She does state maybe a bit increased DOE with moving around. No longer able to walk two days a week anymore due to care giving.  ? ?Past Medical History:  ?Diagnosis Date  ? Allergy   ? Anal fissure   ? Aspiration into airway   ? CAP (community acquired pneumonia) 08/23/2017  ? Chest pain   ? a. 09/04/2018 MV: EF >65%. No ischemia/infarct.  ? Coronary artery calcification seen on CT scan   ? Diastolic dysfunction   ? a. 10/2016 Echo: EF 60-65%, no rwma, Gr1 DD, mild MR, nl RV fxn, mild to mod TR, PASP 31mHg.  ? Diverticulosis   ? Dyspnea   ? GERD (gastroesophageal reflux disease)   ? Hemorrhoids   ? Hyperplastic colon polyp   ? Interstitial lung disorders ([facility]    ? Pulmonary fibrosis ([facility]   ? Septicemia ([facility] 1999  ? spent 4 weeks in ICU, intubated -? PNA  ? Vertebral fracture, pathological   ? ? ?Past Surgical History:  ?Procedure Laterality Date  ? ABDOMINAL HYSTERECTOMY    ? FLEXIBLE BRONCHOSCOPY N/A 07/05/2016  ? Procedure: FLEXIBLE BRONCHOSCOPY;  Surgeon: VVilinda Boehringer MD;  Location: [facility] ORS;  Service: Cardiopulmonary;  Laterality: N/A;  ? IR RADIOLOGIST EVAL & MGMT  11/05/2021  ? ? ?Family History  ?Problem Relation Age of Onset  ? Parkinson's disease Mother   ? Colon polyps Mother 819 ?        ? Ulcers Father   ? Rheumatic fever Father   ? Macular degeneration Sister   ? Healthy Son   ? Healthy Daughter   ? ? ?Social History  ? ?Socioeconomic History  ? Marital status: Married  ?  Spouse name: Not on file  ? Number of children: 2  ? Years of education: Not on file  ? Highest education level: Not on file  ?Occupational History  ? Occupation: Retired   ?Tobacco Use  ? Smoking status: Former  ?  Packs/day: 0.25  ?  Years: 20.00  ?  Pack years: 5.00  ?  Types: Cigarettes  ?  Quit date: 01/16/1981  ?  Years since quitting: 41.0  ? Smokeless tobacco: Never  ?Vaping Use  ? Vaping Use: Never used  ?Substance and Sexual Activity  ? Alcohol use: Yes  ?  Alcohol/week: 7.0 standard drinks  ?  Types: 7 Standard drinks or equivalent per week  ? Drug use: No  ? Sexual activity: Not on file  ?Other Topics Concern  ? Not on file  ?Social History Narrative  ? Married.    ? 2 children.  Lives with her husband in [place].  ? ?Social Determinants of Health  ? ?Financial Resource Strain: Not on file  ?Food Insecurity: Not on file  ?Transportation Needs: Not on file  ?Physical Activity: Not on file  ?Stress: Not on file  ?Social Connections: Not on file  ?Intimate Partner Violence: Not on file  ? ? ?Outpatient Medications Prior to Visit  ?Medication Sig Dispense Refill  ? Albuterol Sulfate, sensor, (PROAIR DIGIHALER) 108 (90 Base) MCG/ACT AEPB Inhale 2 puffs into the lungs every 6 (six)  hours. 1 each 0  ? azaTHIOprine (IMURAN) 50 MG tablet TAKE ONE AND ONE-HALF TABLETS (75 MG) EVERY MORNING AND 1 TABLET (50 MG) EVERY EVENING 225 tablet 0  ? azithromycin (ZITHROMAX) 250 MG tablet Take 1 tablet 3x a week 30 each 3  ? denosumab (PROLIA) 60 MG/ML SOLN injection Inject 60 mg into the skin every 6 (six) months. Administer in upper arm, thigh, or abdomen    ? gabapentin (NEURONTIN) 100 MG capsule Take 2 capsules (200 mg total) by mouth at bedtime. 180 capsule 3  ? montelukast (SINGULAIR) 10 MG tablet Take 1 tablet (10 mg total) by mouth daily. 90 tablet 3  ? Multiple Minerals-Vitamins (CALCIUM & VIT D3 BONE HEALTH PO) Take 1 Dose by mouth daily.    ? pantoprazole (PROTONIX) 20 MG tablet Take 20 mg by mouth 2 (two) times daily.    ? predniSONE (DELTASONE) 1 MG tablet Take 2 tablets (2 mg total) by mouth daily with breakfast. 180 tablet 0  ? propranolol (INDERAL) 10 MG tablet Take 1 tablet (10 mg total) by mouth 2 (two) times daily. For tremor. 180 tablet 3  ? traZODone (DESYREL) 50 MG tablet TAKE 1 TABLET AT BEDTIME FOR SLEEP 90 tablet 3  ? vitamin B-12 (CYANOCOBALAMIN) 500 MCG tablet Take 500 mcg by mouth daily.    ? ?No facility-administered medications prior to visit.  ? ? ?No Known Allergies ? ?ROS ?Review of Systems  ?Constitutional:  Negative for chills, fatigue and unexpected weight change.  ?HENT:  Negative for congestion, ear pain, sinus pressure, sneezing and sore throat.   ?Respiratory:  Positive for shortness of breath (very mild increase DOE noted). Negative for cough, chest tightness and wheezing.   ?Cardiovascular:  Positive for leg swelling (right > left with heaviness to leg at end of day). Negative for chest pain.  ?     Bil Lower extremity with multiple varicosities  ?Genitourinary:  Negative for difficulty urinating.  ?Musculoskeletal:  Negative for arthralgias (no knee pain).  ?Neurological:  Negative for dizziness.  ?Psychiatric/Behavioral:  Negative for agitation and sleep  disturbance.   ?All other systems reviewed and are negative. ? ?  ?Objective:  ?  ?Wt Readings from Last 3 Encounters:  ?01/14/22 110 lb 5 oz (50 kg)  ?01/04/22 111 lb 8 oz (50.6 kg)  ?12/14/21 112 lb 6.4 oz (51 kg)  ? ? ? ?Physical Exam ?Constitutional:   ?   General: She is not in acute distress. ?  Appearance: Normal appearance. She is normal weight. She is not ill-appearing, toxic-appearing or diaphoretic.  ?Cardiovascular:  ?   Rate and Rhythm: Normal rate and regular rhythm.  ?   Pulses:     ?     Popliteal pulses are 2+ on the right side and 2+ on the left side.  ?     Dorsalis pedis pulses are 1+ on the right side and 1+ on the left side.  ?     Posterior tibial pulses are 1+ on the right side and 1+ on the left side.  ?   Comments: Multiple varicosities bil le with no thrombosis or bulging, no pain ? ?Pulmonary:  ?   Effort: Pulmonary effort is normal.  ?   Breath sounds: Stridor (right upper and lower lung) present. Rales (right upper and lower lung) present.  ?Musculoskeletal:  ?   Right knee: Effusion (posterior knee nontender) present.  ?   Right lower leg: 4+ Pitting Edema present.  ?   Left lower leg: 1+ Edema present.  ?   Right ankle: Swelling present. Normal range of motion.  ?   Left ankle: Swelling present. Normal range of motion.  ?Neurological:  ?   Mental Status: She is alert.  ? ? ?BP 118/62   Pulse 77   Temp 98.3 ?F (36.8 ?C)   Resp 16   Ht '5\' 8"'$  (1.727 m)   Wt 110 lb 5 oz (50 kg)   SpO2 98%   BMI 16.77 kg/m?  ?Wt Readings from Last 3 Encounters:  ?01/14/22 110 lb 5 oz (50 kg)  ?01/04/22 111 lb 8 oz (50.6 kg)  ?12/14/21 112 lb 6.4 oz (51 kg)  ? ? ? ?Health Maintenance Due  ?Topic Date Due  ? TETANUS/TDAP  Never done  ? Zoster Vaccines- Shingrix (1 of 2) Never done  ? COVID-19 Vaccine (3 - Moderna risk series) 03/19/2021  ? ? ?There are no preventive care reminders to display for this patient. ? ?Lab Results  ?Component Value Date  ? TSH 2.034 08/11/2020  ? ?Lab Results  ?Component  Value Date  ? WBC 6.3 01/04/2022  ? HGB 10.6 (L) 01/04/2022  ? HCT 31.9 (L) 01/04/2022  ? MCV 116.4 (H) 01/04/2022  ? PLT 313 01/04/2022  ? ?Lab Results  ?Component Value Date  ? NA 135 10/15/2021  ? K 5.0 10/15/2021  ?

## 2022-01-14 NOTE — Patient Instructions (Addendum)
You were referred by hematologist for vascular surgery. Please call the center and call to schedule.  ?Give it one more week, if you do not here anything please reach back out to Dr. Elroy Channel.  ? ?Complete xray(s) prior to leaving today. I will notify you of your results once received. ?Stop by the lab prior to leaving today. I will notify you of your results once received.  ? ?A referral was placed today for orthopedists for a bakers cyst. ?Please let us know if you have not heard back within 2 weeks about the referral. ? ?Due to recent changes in healthcare laws, you may see results of your imaging and/or laboratory studies on MyChart before I have had a chance to review them.  I understand that in some cases there may be results that are confusing or concerning to you. Please understand that not all results are received at the same time and often I may need to interpret multiple results in order to provide you with the best plan of care or course of treatment. Therefore, I ask that you please give me 2 business days to thoroughly review all your results before contacting my office for clarification. Should we see a critical lab result, you will be contacted sooner.  ? ?It was a pleasure seeing you today! Please do not hesitate to reach out with any questions and or concerns. ? ?Regards,  ? ?Mylinda Brook ?FNP-C ? ? ? ?

## 2022-01-14 NOTE — Assessment & Plan Note (Signed)
rx furosemide 20 mg for next ten days ?F/u one week for repeat bmp to monitor potassium ?Weight daily, let us know if >2 pounds in one day ?F/u with vascular ?

## 2022-01-14 NOTE — Assessment & Plan Note (Signed)
Referral placed 4/3 by hematologist ?Call vascular to schedule appt ?

## 2022-01-14 NOTE — Assessment & Plan Note (Signed)
Chest xray today and bnp to r/o CHF potential  ?Pt is with wheezing right bil lobes however with ILD ?

## 2022-01-15 ENCOUNTER — Other Ambulatory Visit: Payer: Self-pay | Admitting: Family

## 2022-01-15 DIAGNOSIS — R7989 Other specified abnormal findings of blood chemistry: Secondary | ICD-10-CM

## 2022-01-15 DIAGNOSIS — J90 Pleural effusion, not elsewhere classified: Secondary | ICD-10-CM

## 2022-01-15 DIAGNOSIS — R0609 Other forms of dyspnea: Secondary | ICD-10-CM

## 2022-01-15 NOTE — Progress Notes (Signed)
FYI  ?Right pleural effusion, slight elevation BNP. Furosemide 20 mg started yesterday.  ?Ordering echocardiogram.  ?Stable pulmonary fibrosis, 10 mm nodule

## 2022-01-15 NOTE — Progress Notes (Signed)
Crystal Gregory,  ?I saw this pt of yours yesterday with newer onset lower extremity swelling right >left.  ?Suspected CHF, ordered BNP is slightly elevated. ?Reviewed 10/22 echo, stable per pulmonologist. Started her on lasix 20 mg and asked her to f/u one week with you. I did order a chest xray, pending.  ? ?Question, do you want me to also order another echocardiogram since these are newer symptoms since 10/22 when her last echo was?

## 2022-01-15 NOTE — Progress Notes (Signed)
Please call pt and let her know small fluid buildup on right side of lung likely mild congestive heart failure. I am ordering an ultrasound of the heart again, should hear something next week to get scheduled.  ? ?Take the furosemide over the weekend and MUST make f/u appt within 1 week to repeat labs and see Anda Kraft in office. I don't see an appt scheduled.

## 2022-01-19 DIAGNOSIS — M4854XS Collapsed vertebra, not elsewhere classified, thoracic region, sequela of fracture: Secondary | ICD-10-CM | POA: Diagnosis not present

## 2022-01-19 DIAGNOSIS — M4854XD Collapsed vertebra, not elsewhere classified, thoracic region, subsequent encounter for fracture with routine healing: Secondary | ICD-10-CM | POA: Diagnosis not present

## 2022-01-20 ENCOUNTER — Ambulatory Visit: Payer: Medicare Other | Admitting: Family

## 2022-01-20 ENCOUNTER — Ambulatory Visit (INDEPENDENT_AMBULATORY_CARE_PROVIDER_SITE_OTHER): Payer: Medicare Other | Admitting: Primary Care

## 2022-01-20 ENCOUNTER — Encounter: Payer: Self-pay | Admitting: Primary Care

## 2022-01-20 VITALS — BP 118/72 | HR 72 | Temp 98.2°F | Ht 68.0 in | Wt 108.0 lb

## 2022-01-20 DIAGNOSIS — J84112 Idiopathic pulmonary fibrosis: Secondary | ICD-10-CM | POA: Diagnosis not present

## 2022-01-20 DIAGNOSIS — R0609 Other forms of dyspnea: Secondary | ICD-10-CM | POA: Diagnosis not present

## 2022-01-20 DIAGNOSIS — G4700 Insomnia, unspecified: Secondary | ICD-10-CM

## 2022-01-20 DIAGNOSIS — R600 Localized edema: Secondary | ICD-10-CM

## 2022-01-20 DIAGNOSIS — R7989 Other specified abnormal findings of blood chemistry: Secondary | ICD-10-CM

## 2022-01-20 LAB — BASIC METABOLIC PANEL
BUN: 17 mg/dL (ref 6–23)
CO2: 27 mEq/L (ref 19–32)
Calcium: 8.9 mg/dL (ref 8.4–10.5)
Chloride: 99 mEq/L (ref 96–112)
Creatinine, Ser: 0.66 mg/dL (ref 0.40–1.20)
GFR: 78.51 mL/min (ref >60.00)
Glucose, Bld: 105 mg/dL — ABNORMAL HIGH (ref 70–99)
Potassium: 4.1 mEq/L (ref 3.5–5.1)
Sodium: 135 mEq/L (ref 135–145)

## 2022-01-20 LAB — BRAIN NATRIURETIC PEPTIDE: Pro B Natriuretic peptide (BNP): 306 pg/mL — ABNORMAL HIGH (ref 0.0–100.0)

## 2022-01-20 MED ORDER — TRAZODONE HCL 50 MG PO TABS: 50.0000 mg | ORAL_TABLET | Freq: Every evening | ORAL | 0 refills | Status: DC | PRN

## 2022-01-20 NOTE — Patient Instructions (Signed)
You will be contacted regarding your echocardiogram.  Please let us know if you have not been contacted within two weeks.  ? ?Stop by the lab prior to leaving today. I will notify you of your results once received.  ? ?It was a pleasure to see you today! ? ?

## 2022-01-20 NOTE — Progress Notes (Signed)
? ?Subjective:  ? ? Patient ID: Crystal Gregory, female    DOB: 09/14/1932, 88 y.o.   MRN: [id number] ? ?HPI ? ?Crystal Gregory is a very pleasant 88 y.o. female with a history of IPF, bronchiectasis, osteoporosis, rheumatoid arthritis, anemia, sleep disturbance, thoracic compression fracture who presents today for follow up from her visit last week, for a refill of her Trazodone, and for handicap placard.  ? ?She was last evaluated by Lawerance Bach, NP on 01/14/22 for 1+ week history of right lower extremity edema. She underwent venous Doppler per her hematologist the week prior, negative for DVT, positive for right Baker's cyst.  During her visit with Tabatha she underwent labs which revealed BNP of 384, chest x-ray with small right pleural effusion.  An echocardiogram was ordered.  She was prescribed furosemide 20 mg and advised to follow-up today. ? ?Since her visit last week she has been taking furosemide 20 mg daily with some improvement to her extremity edema but without resolve. She continues to notice moderate right lower extremity edema from her mid chin through her toes. She does notice mild swelling to her left lower extremity.  She has tolerated furosemide well, denies dizziness, urinary incontinence.  ? ?She has noticed increased dyspnea and fatigue. She attributes her fatigue to her stress from being the sole caregiver of her husband. Previously following with Dr. Rockey Situ, last visit was years ago.  She has yet to hear from the scheduler regarding the echocardiogram. ? ?She is needing a refill of her trazodone for which she takes as needed for sleep. ? ?She needs her handicap placard renewed for which she has in place for her chronic pulmonary fibrosis. ? ? ? ?Review of Systems  ?Respiratory:  Positive for shortness of breath.   ?Cardiovascular:  Positive for leg swelling. Negative for chest pain.  ?Skin:  Negative for color change.  ?Neurological:  Negative for dizziness and weakness.  ? ?   ? ? ?Past Medical  History:  ?Diagnosis Date  ? Allergy   ? Anal fissure   ? Aspiration into airway   ? CAP (community acquired pneumonia) 08/23/2017  ? Chest pain   ? a. 09/04/2018 MV: EF >65%. No ischemia/infarct.  ? Coronary artery calcification seen on CT scan   ? Diastolic dysfunction   ? a. 10/2016 Echo: EF 60-65%, no rwma, Gr1 DD, mild MR, nl RV fxn, mild to mod TR, PASP 75mHg.  ? Diverticulosis   ? Dyspnea   ? GERD (gastroesophageal reflux disease)   ? Hemorrhoids   ? Hyperplastic colon polyp   ? [place]stitial lung disorders ([facility]   ? [place]monary fibrosis [place]   ? [place]ic[place] [place]  ? spent 4 weeks in ICU, intubated -? PNA  ? Vertebral fracture, pathological   ? ? ?Social History  ? ?Socioeconomic History  ? Marital status: Married  ?  Spouse name: Not on file  ? Number of children: 2  ? Years of education: Not on file  ? Highest education level: Not on file  ?Occupational History  ? Occupation: Retired   ?Tobacco Use  ? Smoking status: Former  ?  Packs/day: 0.25  ?  Years: 20.00  ?  Pack years: 5.00  ?  Types: Cigarettes  ?  Quit date: 01/16/1981  ?  Years since quitting: 41.0  ? Smokeless tobacco: Never  ?Vaping Use  ? Vaping Use: Never used  ?Substance and Sexual Activity  ? Alcohol use: Yes  ?  Alcohol/week: 7.0 standard drinks  ?  Types: 7 Standard drinks or equivalent per week  ? Drug use: No  ? Sexual activity: Not on file  ?Other Topics Concern  ? Not on file  ?Social History Narrative  ? Married.    ? 2 children.  Lives with her husband in [place].  ? ?Social Determinants of Health  ? ?Financial Resource Strain: Not on file  ?Food Insecurity: Not on file  ?Transportation Needs: Not on file  ?Physical Activity: Not on file  ?Stress: Not on file  ?Social Connections: Not on file  ?Intimate Partner Violence: Not on file  ? ? ?Past Surgical History:  ?Procedure Laterality Date  ? ABDOMINAL HYSTERECTOMY    ? FLEXIBLE BRONCHOSCOPY N/A 07/05/2016  ? Procedure: FLEXIBLE BRONCHOSCOPY;  Surgeon: Vilinda Boehringer, MD;  Location:  [facility] ORS;  Service: Cardiopulmonary;  Laterality: N/A;  ? IR RADIOLOGIST EVAL & MGMT  11/05/2021  ? ? ?Family History  ?Problem Relation Age of Onset  ? Parkinson's disease Mother   ? Colon polyps Mother 68  ?        ? Ulcers Father   ? Rheumatic fever Father   ? Macular degeneration Sister   ? Healthy Son   ? Healthy Daughter   ? ? ?No Known Allergies ? ?Current Outpatient Medications on File Prior to Visit  ?Medication Sig Dispense Refill  ? Albuterol Sulfate, sensor, (PROAIR DIGIHALER) 108 (90 Base) MCG/ACT AEPB Inhale 2 puffs into the lungs every 6 (six) hours. 1 each 0  ? azaTHIOprine (IMURAN) 50 MG tablet TAKE ONE AND ONE-HALF TABLETS (75 MG) EVERY MORNING AND 1 TABLET (50 MG) EVERY EVENING 225 tablet 0  ? azithromycin (ZITHROMAX) 250 MG tablet Take 1 tablet 3x a week 30 each 3  ? denosumab (PROLIA) 60 MG/ML SOLN injection Inject 60 mg into the skin every 6 (six) months. Administer in upper arm, thigh, or abdomen    ? furosemide (LASIX) 20 MG tablet Take 1 tablet (20 mg total) by mouth daily for 10 days. 10 tablet 0  ? gabapentin (NEURONTIN) 100 MG capsule Take 2 capsules (200 mg total) by mouth at bedtime. 180 capsule 3  ? montelukast (SINGULAIR) 10 MG tablet Take 1 tablet (10 mg total) by mouth daily. 90 tablet 3  ? Multiple Minerals-Vitamins (CALCIUM & VIT D3 BONE HEALTH PO) Take 1 Dose by mouth daily.    ? pantoprazole (PROTONIX) 20 MG tablet Take 20 mg by mouth 2 (two) times daily.    ? predniSONE (DELTASONE) 1 MG tablet Take 2 tablets (2 mg total) by mouth daily with breakfast. 180 tablet 0  ? propranolol (INDERAL) 10 MG tablet Take 1 tablet (10 mg total) by mouth 2 (two) times daily. For tremor. 180 tablet 3  ? traZODone (DESYREL) 50 MG tablet TAKE 1 TABLET AT BEDTIME FOR SLEEP 90 tablet 3  ? vitamin B-12 (CYANOCOBALAMIN) 500 MCG tablet Take 500 mcg by mouth daily.    ? ?No current facility-administered medications on file prior to visit.  ? ? ?BP 118/72 (BP Location: Left Arm, Patient Position: Sitting,  Cuff Size: Normal)   Pulse 72   Temp 98.2 ?F (36.8 ?C) (Temporal)   Ht '5\' 8"'$  (1.727 m)   Wt 108 lb (49 kg)   SpO2 96%   BMI 16.42 kg/m?  ?Objective:  ? Physical Exam ?Cardiovascular:  ?   Rate and Rhythm: Normal rate and regular rhythm.  ?   Pulses:     ?     Dorsalis pedis pulses are 2+  on the right side.  ?     Posterior tibial pulses are 2+ on the right side.  ?   Comments: Moderate right lower extremity edema from mid shin to toes, 2+ pitting to dorsal foot, 1+ pitting to leg. ? ?Mild left lower extremity edema, no pitting. ?Pulmonary:  ?   Effort: Pulmonary effort is normal.  ?   Breath sounds: Normal breath sounds.  ?Musculoskeletal:  ?   Cervical back: Neck supple.  ?Skin: ?   General: Skin is warm and dry.  ?   Findings: No erythema.  ? ? ? ? ? ?   ?Assessment & Plan:  ? ? ? ? ?This visit occurred during the SARS-CoV-2 public health emergency.  Safety protocols were in place, including screening questions prior to the visit, additional usage of staff PPE, and extensive cleaning of exam room while observing appropriate contact time as indicated for disinfecting solutions.  ?

## 2022-01-20 NOTE — Assessment & Plan Note (Signed)
Slight improvement with furosemide 20 mg. ? ?BMP and BNP pending today. ? ?Consider dose increase of furosemide to 40 mg daily.  Await results. ? ?Stat echocardiogram ordered and pending. ?

## 2022-01-20 NOTE — Assessment & Plan Note (Signed)
Continue trazodone 50 mg as needed. ?Refill sent to pharmacy ?

## 2022-01-20 NOTE — Assessment & Plan Note (Signed)
Suspect symptoms of mild congestive heart failure. ? ?Repeat echocardiogram ordered and pending today. ?Repeat BNP pending. ? ?Consider increasing furosemide to 40 mg daily, await BMP results. ?

## 2022-01-20 NOTE — Assessment & Plan Note (Signed)
Completed handicap placard renewal form per patient request. ?

## 2022-01-21 ENCOUNTER — Ambulatory Visit (INDEPENDENT_AMBULATORY_CARE_PROVIDER_SITE_OTHER): Payer: Medicare Other

## 2022-01-21 ENCOUNTER — Other Ambulatory Visit: Payer: Self-pay | Admitting: Primary Care

## 2022-01-21 DIAGNOSIS — R7989 Other specified abnormal findings of blood chemistry: Secondary | ICD-10-CM

## 2022-01-21 DIAGNOSIS — R600 Localized edema: Secondary | ICD-10-CM

## 2022-01-21 DIAGNOSIS — R0609 Other forms of dyspnea: Secondary | ICD-10-CM

## 2022-01-21 LAB — ECHOCARDIOGRAM COMPLETE
AR max vel: 2.47 cm2
AV Area VTI: 2.37 cm2
AV Area mean vel: 2.32 cm2
AV Mean grad: 5 mmHg
AV Peak grad: 8.2 mmHg
Ao pk vel: 1.43 m/s
Area-P 1/2: 3.08 cm2
Calc EF: 56.4 %
S' Lateral: 2.5 cm
Single Plane A2C EF: 57.4 %
Single Plane A4C EF: 54.6 %

## 2022-01-21 MED ORDER — FUROSEMIDE 20 MG PO TABS: 40.0000 mg | ORAL_TABLET | Freq: Every day | ORAL | 0 refills | Status: DC

## 2022-01-22 ENCOUNTER — Other Ambulatory Visit: Payer: Self-pay | Admitting: Primary Care

## 2022-01-22 DIAGNOSIS — G250 Essential tremor: Secondary | ICD-10-CM

## 2022-01-25 ENCOUNTER — Ambulatory Visit
Admission: RE | Admit: 2022-01-25 | Discharge: 2022-01-25 | Disposition: A | Discharge status: Home / Self Care | Payer: Medicare Other | Source: Ambulatory Visit | Attending: Primary Care | Admitting: Primary Care

## 2022-01-25 DIAGNOSIS — Z780 Asymptomatic menopausal state: Secondary | ICD-10-CM | POA: Diagnosis not present

## 2022-01-25 DIAGNOSIS — M8000XS Age-related osteoporosis with current pathological fracture, unspecified site, sequela: Secondary | ICD-10-CM | POA: Insufficient documentation

## 2022-01-25 DIAGNOSIS — E2839 Other primary ovarian failure: Secondary | ICD-10-CM | POA: Diagnosis not present

## 2022-01-25 DIAGNOSIS — M8589 Other specified disorders of bone density and structure, multiple sites: Secondary | ICD-10-CM | POA: Diagnosis not present

## 2022-01-25 DIAGNOSIS — M810 Age-related osteoporosis without current pathological fracture: Secondary | ICD-10-CM | POA: Diagnosis not present

## 2022-01-26 DIAGNOSIS — M4854XD Collapsed vertebra, not elsewhere classified, thoracic region, subsequent encounter for fracture with routine healing: Secondary | ICD-10-CM | POA: Diagnosis not present

## 2022-01-26 DIAGNOSIS — M4854XS Collapsed vertebra, not elsewhere classified, thoracic region, sequela of fracture: Secondary | ICD-10-CM | POA: Diagnosis not present

## 2022-01-29 ENCOUNTER — Encounter: Payer: Self-pay | Admitting: Family

## 2022-01-29 ENCOUNTER — Ambulatory Visit (INDEPENDENT_AMBULATORY_CARE_PROVIDER_SITE_OTHER): Payer: Medicare Other | Admitting: Family

## 2022-01-29 VITALS — BP 118/50 | HR 88 | Temp 97.8°F | Resp 16 | Ht 68.0 in | Wt 106.4 lb

## 2022-01-29 DIAGNOSIS — R600 Localized edema: Secondary | ICD-10-CM | POA: Diagnosis not present

## 2022-01-29 DIAGNOSIS — Z5181 Encounter for therapeutic drug level monitoring: Secondary | ICD-10-CM | POA: Insufficient documentation

## 2022-01-29 DIAGNOSIS — R0609 Other forms of dyspnea: Secondary | ICD-10-CM

## 2022-01-29 DIAGNOSIS — M7121 Synovial cyst of popliteal space [Baker], right knee: Secondary | ICD-10-CM

## 2022-01-29 DIAGNOSIS — R7989 Other specified abnormal findings of blood chemistry: Secondary | ICD-10-CM

## 2022-01-29 DIAGNOSIS — I5033 Acute on chronic diastolic (congestive) heart failure: Secondary | ICD-10-CM | POA: Diagnosis not present

## 2022-01-29 HISTORY — DX: Localized edema: R60.0

## 2022-01-29 LAB — BRAIN NATRIURETIC PEPTIDE: Pro B Natriuretic peptide (BNP): 363 pg/mL — ABNORMAL HIGH (ref 0.0–100.0)

## 2022-01-29 LAB — BASIC METABOLIC PANEL
BUN: 20 mg/dL (ref 6–23)
CO2: 28 mEq/L (ref 19–32)
Calcium: 9.1 mg/dL (ref 8.4–10.5)
Chloride: 98 mEq/L (ref 96–112)
Creatinine, Ser: 0.7 mg/dL (ref 0.40–1.20)
GFR: 77.39 mL/min (ref >60.00)
Glucose, Bld: 93 mg/dL (ref 70–99)
Potassium: 3.6 mEq/L (ref 3.5–5.1)
Sodium: 135 mEq/L (ref 135–145)

## 2022-01-29 NOTE — Assessment & Plan Note (Signed)
Only slight improvement with lasix, and pt not tolerating increased dose furosemide 40 mg well. Makes her dizzy and feels weak. Decrease to 20 mg once daily lasix, pending results of BMP.  ? ?Elevate above heart.  ?Did recommend vascular consult, pt states she does not want to see any more doctors right now. ?Pt to weight daily to see if any gain 2 pounds.  ? ?

## 2022-01-29 NOTE — Assessment & Plan Note (Signed)
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 We are decreasing lasix to 20 mg once daily, also cc' her pulmonologist on note as well for pulmonary fibrosis, increase with doe sob. Did review right pleural effusion on recent cxr. Reviewed echocardiogram, most recent, stable findings.  ? ?If any sudden worsening sob/cp/doe go to er. F/u two weeks with pcp.  ?Pending results of BNP and BMP  ?

## 2022-01-29 NOTE — Progress Notes (Signed)
? ?Established Patient Office Visit ? ?Subjective:  ?Patient ID: Crystal Gregory, female    DOB: 13-Apr-1932  Age: 88 y.o. MRN: [id number] ? ?CC:  ?Chief Complaint  ?Patient presents with  ? Foot Swelling  ? ? ?HPI ?Crystal Gregory is here today for follow up.  ?Pt is without acute concerns. ? ?Last saw by her PCP Alma Friendly 01/20/22, and her echocardiogram was ordered.  ?Repeat BNP was ordered as well. Anemia stable as of 01/04/22. Hast lost four pounds since last visit, is losing a lot of fluid from furosemide. Since increasing to 40 mg she feels pretty weak after taking the pills. She didn't take it this am because it makes her feel very fatigued. Echocardiogram reviewed with left Ventricular EJ 60-65%, with ventricular systolic function is norma. Does have cardiologist. No increase in right ventricular size is normal. DVT was normal, no DVT only seen was right bakers cyst which pt states was for some time she suspects as h/o injury in the past to the knee.  ? ?Does still feel sob and doe, reviewed cxr which did have small right pleural effusion. Pt with pulmonary fibrosis and copd as well. No f/u on schedule.  ? ?ProBNP ?   ?Component Value Date/Time  ? PROBNP 306.0 (H) 01/20/2022 1308  ?4/13: 384  ? ?Lab Results  ?Component Value Date  ? NA 135 01/20/2022  ? K 4.1 01/20/2022  ? CO2 27 01/20/2022  ? GLUCOSE 105 (H) 01/20/2022  ? BUN 17 01/20/2022  ? CREATININE 0.66 01/20/2022  ? CALCIUM 8.9 01/20/2022  ? EGFR 84 10/15/2021  ? GFRNONAA 79 03/06/2021  ?' ?Wt Readings from Last 3 Encounters:  ?01/29/22 106 lb 6 oz (48.3 kg)  ?01/20/22 108 lb (49 kg)  ?01/14/22 110 lb 5 oz (50 kg)  ? ? ? ? ? ? ?Past Medical History:  ?Diagnosis Date  ? Allergy   ? Anal fissure   ? Aspiration into airway   ? CAP (community acquired pneumonia) 08/23/2017  ? Chest pain   ? a. 09/04/2018 MV: EF >65%. No ischemia/infarct.  ? Coronary artery calcification seen on CT scan   ? Diastolic dysfunction   ? a. 10/2016 Echo: EF 60-65%, no rwma, Gr1  DD, mild MR, nl RV fxn, mild to mod TR, PASP 56mmHg.  ? Diverticulosis   ? Dyspnea   ? GERD (gastroesophageal reflux disease)   ? Hemorrhoids   ? Hyperplastic colon polyp   ? Interstitial lung disorders ([place])   ? Pulmonary fibrosis ([place])   ? Septicemia ([facility]) 1999  ? spent 4 weeks in ICU, intubated -? PNA  ? Vertebral fracture, pathological   ? ? ?Past Surgical History:  ?Procedure Laterality Date  ? ABDOMINAL HYSTERECTOMY    ? FLEXIBLE BRONCHOSCOPY N/A 07/05/2016  ? Procedure: FLEXIBLE BRONCHOSCOPY;  Surgeon: Vilinda Boehringer, MD;  Location: [facility] ORS;  Service: Cardiopulmonary;  Laterality: N/A;  ? IR RADIOLOGIST EVAL & MGMT  11/05/2021  ? ? ?Family History  ?Problem Relation Age of Onset  ? Parkinson's disease Mother   ? Colon polyps Mother 61  ?        ? Ulcers Father   ? Rheumatic fever Father   ? Macular degeneration Sister   ? Healthy Son   ? Healthy Daughter   ? ? ?Social History  ? ?Socioeconomic History  ? Marital status: Married  ?  Spouse name: Not on file  ? Number of children: 2  ? Years of education: Not on file  ?  Highest education level: Not on file  ?Occupational History  ? Occupation: Retired   ?Tobacco Use  ? Smoking status: Former  ?  Packs/day: 0.25  ?  Years: 20.00  ?  Pack years: 5.00  ?  Types: Cigarettes  ?  Quit date: 01/16/1981  ?  Years since quitting: 41.0  ? Smokeless tobacco: Never  ?Vaping Use  ? Vaping Use: Never used  ?Substance and Sexual Activity  ? Alcohol use: Yes  ?  Alcohol/week: 7.0 standard drinks  ?  Types: 7 Standard drinks or equivalent per week  ? Drug use: No  ? Sexual activity: Not on file  ?Other Topics Concern  ? Not on file  ?Social History Narrative  ? Married.    ? 2 children.  Lives with her husband in [place].  ? ?Social Determinants of Health  ? ?Financial Resource Strain: Not on file  ?Food Insecurity: Not on file  ?Transportation Needs: Not on file  ?Physical Activity: Not on file  ?Stress: Not on file  ?Social Connections: Not on file  ?Intimate Partner Violence:  Not on file  ? ? ?Outpatient Medications Prior to Visit  ?Medication Sig Dispense Refill  ? Albuterol Sulfate, sensor, (PROAIR DIGIHALER) 108 (90 Base) MCG/ACT AEPB Inhale 2 puffs into the lungs every 6 (six) hours. 1 each 0  ? azaTHIOprine (IMURAN) 50 MG tablet TAKE ONE AND ONE-HALF TABLETS (75 MG) EVERY MORNING AND 1 TABLET (50 MG) EVERY EVENING 225 tablet 0  ? azithromycin (ZITHROMAX) 250 MG tablet Take 1 tablet 3x a week 30 each 3  ? denosumab (PROLIA) 60 MG/ML SOLN injection Inject 60 mg into the skin every 6 (six) months. Administer in upper arm, thigh, or abdomen    ? furosemide (LASIX) 20 MG tablet Take 2 tablets (40 mg total) by mouth daily. For leg swelling. 14 tablet 0  ? furosemide (LASIX) 20 MG tablet Take 20 mg by mouth daily.    ? gabapentin (NEURONTIN) 100 MG capsule Take 2 capsules (200 mg total) by mouth at bedtime. 180 capsule 3  ? montelukast (SINGULAIR) 10 MG tablet Take 1 tablet (10 mg total) by mouth daily. 90 tablet 3  ? Multiple Minerals-Vitamins (CALCIUM & VIT D3 BONE HEALTH PO) Take 1 Dose by mouth daily.    ? pantoprazole (PROTONIX) 20 MG tablet Take 20 mg by mouth 2 (two) times daily.    ? predniSONE (DELTASONE) 1 MG tablet Take 2 tablets (2 mg total) by mouth daily with breakfast. 180 tablet 0  ? propranolol (INDERAL) 10 MG tablet TAKE 1 TABLET TWICE A DAY FOR TREMOR 180 tablet 2  ? traZODone (DESYREL) 50 MG tablet Take 1 tablet (50 mg total) by mouth at bedtime as needed for sleep. For sleep. 90 tablet 0  ? vitamin B-12 (CYANOCOBALAMIN) 500 MCG tablet Take 500 mcg by mouth daily.    ? ?No facility-administered medications prior to visit.  ? ? ?No Known Allergies ? ?ROS ?Review of Systems  ?Constitutional:  Positive for unexpected weight change (weight loss four pounds). Negative for chills, fatigue and fever.  ?Eyes:  Negative for visual disturbance.  ?Respiratory:  Positive for shortness of breath (with doe). Negative for chest tightness and wheezing.   ?Cardiovascular:  Negative for  chest pain.  ?Genitourinary:  Negative for difficulty urinating.  ?Skin:  Negative for rash.  ?Neurological:  Positive for dizziness (at times with increased dose of furosemide 40 mg only taking once daily). Negative for headaches.  ? ? ? ?  ?  Objective:  ?  ?Physical Exam ?Vitals reviewed.  ?Constitutional:   ?   General: She is not in acute distress. ?   Appearance: Normal appearance. She is not ill-appearing or toxic-appearing.  ?HENT:  ?   Right Ear: Tympanic membrane normal.  ?   Left Ear: Tympanic membrane normal.  ?   Mouth/Throat:  ?   Mouth: Mucous membranes are moist.  ?   Pharynx: No pharyngeal swelling.  ?   Tonsils: No tonsillar exudate.  ?Eyes:  ?   Extraocular Movements: Extraocular movements intact.  ?   Conjunctiva/sclera: Conjunctivae normal.  ?   Pupils: Pupils are equal, round, and reactive to light.  ?Neck:  ?   Thyroid: No thyroid mass.  ?Cardiovascular:  ?   Rate and Rhythm: Normal rate and regular rhythm.  ?   Pulses:     ?     Popliteal pulses are 1+ on the left side.  ?     Dorsalis pedis pulses are 1+ on the left side.  ?Pulmonary:  ?   Effort: Pulmonary effort is normal.  ?   Breath sounds: Examination of the left-upper field reveals rales. Examination of the left-middle field reveals rales. Rales present.  ?Abdominal:  ?   General: Abdomen is flat. Bowel sounds are normal.  ?   Palpations: Abdomen is soft.  ?Musculoskeletal:     ?   General: Normal range of motion.  ?   Right lower leg: 2+ Edema present.  ?   Left lower leg: No edema.  ?Lymphadenopathy:  ?   Cervical:  ?   Right cervical: No superficial cervical adenopathy. ?   Left cervical: No superficial cervical adenopathy.  ?Skin: ?   General: Skin is warm.  ?   Capillary Refill: Capillary refill takes less than 2 seconds.  ?Neurological:  ?   General: No focal deficit present.  ?   Mental Status: She is alert and oriented to person, place, and time.  ?Psychiatric:     ?   Mood and Affect: Mood normal.     ?   Behavior: Behavior  normal.     ?   Thought Content: Thought content normal.     ?   Judgment: Judgment normal.  ? ? ? ? ?BP (!) 118/50   Pulse 88   Temp 97.8 ?F (36.6 ?C)   Resp 16   Ht $R'5\' 8"'Am$  (1.727 m)   Wt 106 lb 6 oz (48.

## 2022-01-29 NOTE — Assessment & Plan Note (Signed)
Advised pt may be contributing to swelling right lower extremity, pt does not wish to see orthopedist at this time.  ?

## 2022-01-29 NOTE — Assessment & Plan Note (Signed)
Reviewed last bnp.  ?Slight decrease.  ?Repeat bnp today, pending ?

## 2022-01-29 NOTE — Assessment & Plan Note (Signed)
Repeating bmp, on lasix, monitoring potassium pending results.  ?

## 2022-01-29 NOTE — Patient Instructions (Signed)
Go down to 20 mg once daily for furosemide from 40 mg to see if how you are feeling improves.  ?Stop by the lab prior to leaving today. I will notify you of your results once received.  ? ?Due to recent changes in healthcare laws, you may see results of your imaging and/or laboratory studies on MyChart before I have had a chance to review them.  I understand that in some cases there may be results that are confusing or concerning to you. Please understand that not all results are received at the same time and often I may need to interpret multiple results in order to provide you with the best plan of care or course of treatment. Therefore, I ask that you please give me 2 business days to thoroughly review all your results before contacting my office for clarification. Should we see a critical lab result, you will be contacted sooner.  ? ?It was a pleasure seeing you today! Please do not hesitate to reach out with any questions and or concerns. ? ?Regards,  ? ?Verdene Creson ?FNP-C ? ?

## 2022-01-29 NOTE — Assessment & Plan Note (Signed)
Advised pt to also f/u with pulmonary for fibrosis as well as considered cardiologist however pt declines.  ?

## 2022-02-03 DIAGNOSIS — Z20822 Contact with and (suspected) exposure to covid-19: Secondary | ICD-10-CM | POA: Diagnosis not present

## 2022-02-11 ENCOUNTER — Encounter: Payer: Self-pay | Admitting: Primary Care

## 2022-02-11 ENCOUNTER — Ambulatory Visit (INDEPENDENT_AMBULATORY_CARE_PROVIDER_SITE_OTHER): Payer: Medicare Other | Admitting: Primary Care

## 2022-02-11 VITALS — BP 100/62 | HR 67 | Temp 98.6°F | Ht 68.0 in | Wt 108.0 lb

## 2022-02-11 DIAGNOSIS — M4854XS Collapsed vertebra, not elsewhere classified, thoracic region, sequela of fracture: Secondary | ICD-10-CM | POA: Diagnosis not present

## 2022-02-11 DIAGNOSIS — I5033 Acute on chronic diastolic (congestive) heart failure: Secondary | ICD-10-CM

## 2022-02-11 DIAGNOSIS — M2550 Pain in unspecified joint: Secondary | ICD-10-CM | POA: Diagnosis not present

## 2022-02-11 DIAGNOSIS — R600 Localized edema: Secondary | ICD-10-CM

## 2022-02-11 DIAGNOSIS — M069 Rheumatoid arthritis, unspecified: Secondary | ICD-10-CM | POA: Diagnosis not present

## 2022-02-11 NOTE — Assessment & Plan Note (Addendum)
Ongoing, no improvement despite furosemide 20 mg and 40 mg.  ?She has some torsemide at home from her husband's medication, she will notify which dose.  ? ?Reviewed echocardiogram from last month. ? ?Will refer back to vascular services as she does have a weak pulse to right dorsal foot.  ? ?Reviewed labs in chart.  ?

## 2022-02-11 NOTE — Patient Instructions (Signed)
You will be contacted regarding your referral to physical therapy and for vascular services.  Please let us know if you have not been contacted within two weeks.  ? ?Please contact me with the dose of torsemide you have at home. ? ?Stop furosemide. ? ?It was a pleasure to see you today! ? ? ?

## 2022-02-11 NOTE — Assessment & Plan Note (Signed)
Referral placed to physical therapy for further treatment. ?

## 2022-02-11 NOTE — Progress Notes (Signed)
? ?Subjective:  ? ? Patient ID: Crystal Gregory, female    DOB: [birth year], 88 y.o.   MRN: [id number] ? ?HPI ? ?Crystal Gregory is a very pleasant 88 y.o. female with a history of GERD pains, CHF, idiopathic pulmonary fibrosis, osteoporosis, rheumatoid arthritis who presents today for follow-up of lower extremity edema. ? ?She would also like to be referred to physical therapy for chronic back pain, generalized joint pain.  She did well with physical therapy previously and would like to resume.  She follows with physical therapy at [facility] legs. ? ?She was last evaluated by me on 01/20/2022 for ongoing lower extremity edema, right worse than left despite compliance to furosemide 20 mg daily.  She underwent venous ultrasound for DVT evaluation which was negative.  During this visit we increased her furosemide to 40 mg daily.  We repeated her echocardiogram given new her edema and increased dyspnea.  She was asked to follow-up 1 week later. ? ?Evaluated by Cassandria Santee, NP on 01/29/2022 during my absence.  Echocardiogram did not show significant change from prior echocardiogram 6 months ago.  During this visit she had lost 4 pounds, endorsed feeling weak after taking furosemide 40 mg daily.  She did notice a slight improvement in her lower extremity edema with Lasix 40 mg, however her dose was reduced to 20 mg as she felt symptoms of weakness on the 40 mg dose.  It was advised that she follow-up with her cardiologist, vascular surgery but she declined as she was already seen to many doctors and was too busy caring for her husband.  Repeat BMP showed increase to 363.  Renal function was unchanged. ? ?Today she continues to notice right lower extremity edema, she continues to feel weak on furosemide 20 mg. She follows with cardiology but has not seen them for follow-up recently.  She was referred to vascular services several months ago but declined the referral as she was busy with her terminally ill husband.  She is open to a  referral today. ? ?Her husband has several prescriptions for torsemide at home. ? ?Wt Readings from Last 3 Encounters:  ?02/11/22 108 lb (49 kg)  ?01/29/22 106 lb 6 oz (48.3 kg)  ?01/20/22 108 lb (49 kg)  ? ? ? ? ?Review of Systems  ?Respiratory:  Positive for shortness of breath.   ?Cardiovascular:  Positive for leg swelling. Negative for chest pain.  ?Musculoskeletal:  Positive for arthralgias and back pain.  ? ?   ? ? ?Past Medical History:  ?Diagnosis Date  ? Allergy   ? Anal fissure   ? Aspiration into airway   ? CAP (community acquired pneumonia) 08/23/2017  ? Chest pain   ? a. 09/04/2018 MV: EF >65%. No ischemia/infarct.  ? Coronary artery calcification seen on CT scan   ? Diastolic dysfunction   ? a. 10/2016 Echo: EF 60-65%, no rwma, Gr1 DD, mild MR, nl RV fxn, mild to mod TR, PASP 53mHg.  ? Diverticulosis   ? Dyspnea   ? GERD (gastroesophageal reflux disease)   ? Hemorrhoids   ? Hyperplastic colon polyp   ? [place]   ? [place]   [place]  ? spent 4 weeks in ICU, intubated -? PNA  ? Vertebral fracture, pathological   ? ? ?Social History  ? ?Socioeconomic History  ? Marital status: Married  ?  Spouse name: Not on file  ? Number of children: 2  ? Years of education: Not on  file  ? Highest education level: Not on file  ?Occupational History  ? Occupation: Retired   ?Tobacco Use  ? Smoking status: Former  ?  Packs/day: 0.25  ?  Years: 20.00  ?  Pack years: 5.00  ?  Types: Cigarettes  ?  Quit date: 01/16/1981  ?  Years since quitting: 41.0  ? Smokeless tobacco: Never  ?Vaping Use  ? Vaping Use: Never used  ?Substance and Sexual Activity  ? Alcohol use: Yes  ?  Alcohol/week: 7.0 standard drinks  ?  Types: 7 Standard drinks or equivalent per week  ? Drug use: No  ? Sexual activity: Not on file  ?Other Topics Concern  ? Not on file  ?Social History Narrative  ? Married.    ? 2 children.  Lives with her husband in [place].  ? ?Social Determinants of Health   ? ?Financial Resource Strain: Not on file  ?Food Insecurity: Not on file  ?Transportation Needs: Not on file  ?Physical Activity: Not on file  ?Stress: Not on file  ?Social Connections: Not on file  ?Intimate Partner Violence: Not on file  ? ? ?Past Surgical History:  ?Procedure Laterality Date  ? ABDOMINAL HYSTERECTOMY    ? FLEXIBLE BRONCHOSCOPY N/A 07/05/2016  ? Procedure: FLEXIBLE BRONCHOSCOPY;  Surgeon: Vilinda Boehringer, MD;  Location: [facility] ORS;  Service: Cardiopulmonary;  Laterality: N/A;  ? IR RADIOLOGIST EVAL & MGMT  11/05/2021  ? ? ?Family History  ?Problem Relation Age of Onset  ? Parkinson's disease Mother   ? Colon polyps Mother 53  ?        ? Ulcers Father   ? Rheumatic fever Father   ? Macular degeneration Sister   ? Healthy Son   ? Healthy Daughter   ? ? ?No Known Allergies ? ?Current Outpatient Medications on File Prior to Visit  ?Medication Sig Dispense Refill  ? Albuterol Sulfate, sensor, (PROAIR DIGIHALER) 108 (90 Base) MCG/ACT AEPB Inhale 2 puffs into the lungs every 6 (six) hours. 1 each 0  ? azaTHIOprine (IMURAN) 50 MG tablet TAKE ONE AND ONE-HALF TABLETS (75 MG) EVERY MORNING AND 1 TABLET (50 MG) EVERY EVENING 225 tablet 0  ? azithromycin (ZITHROMAX) 250 MG tablet Take 1 tablet 3x a week 30 each 3  ? denosumab (PROLIA) 60 MG/ML SOLN injection Inject 60 mg into the skin every 6 (six) months. Administer in upper arm, thigh, or abdomen    ? furosemide (LASIX) 20 MG tablet Take 2 tablets (40 mg total) by mouth daily. For leg swelling. 14 tablet 0  ? furosemide (LASIX) 20 MG tablet Take 20 mg by mouth daily.    ? gabapentin (NEURONTIN) 100 MG capsule Take 2 capsules (200 mg total) by mouth at bedtime. 180 capsule 3  ? montelukast (SINGULAIR) 10 MG tablet Take 1 tablet (10 mg total) by mouth daily. 90 tablet 3  ? Multiple Minerals-Vitamins (CALCIUM & VIT D3 BONE HEALTH PO) Take 1 Dose by mouth daily.    ? pantoprazole (PROTONIX) 20 MG tablet Take 20 mg by mouth 2 (two) times daily.    ? predniSONE  (DELTASONE) 1 MG tablet Take 2 tablets (2 mg total) by mouth daily with breakfast. 180 tablet 0  ? propranolol (INDERAL) 10 MG tablet TAKE 1 TABLET TWICE A DAY FOR TREMOR 180 tablet 2  ? traZODone (DESYREL) 50 MG tablet Take 1 tablet (50 mg total) by mouth at bedtime as needed for sleep. For sleep. 90 tablet 0  ? vitamin B-12 (  CYANOCOBALAMIN) 500 MCG tablet Take 500 mcg by mouth daily.    ? ?No current facility-administered medications on file prior to visit.  ? ? ?BP 100/62   Pulse 67   Temp 98.6 ?F (37 ?C) (Oral)   Ht '5\' 8"'$  (3.810 m)   Wt 108 lb (49 kg)   SpO2 95%   BMI 16.42 kg/m?  ?Objective:  ? Physical Exam ?Cardiovascular:  ?   Rate and Rhythm: Normal rate and regular rhythm.  ?   Pulses:     ?     Dorsalis pedis pulses are 1+ on the right side and 2+ on the left side.  ?     Posterior tibial pulses are 1+ on the right side and 2+ on the left side.  ?   Comments: Moderate right lower extremity edema noted from distal patella to toes. ?Right dorsalis pedis and posterior tibialis pulses are weak. ?Pulmonary:  ?   Effort: Pulmonary effort is normal.  ?   Breath sounds: Normal breath sounds.  ?Musculoskeletal:  ?   Cervical back: Neck supple.  ?Skin: ?   General: Skin is warm and dry.  ? ? ? ? ? ?   ?Assessment & Plan:  ? ? ? ? ?This visit occurred during the SARS-CoV-2 public health emergency.  Safety protocols were in place, including screening questions prior to the visit, additional usage of staff PPE, and extensive cleaning of exam room while observing appropriate contact time as indicated for disinfecting solutions.  ?

## 2022-02-11 NOTE — Assessment & Plan Note (Signed)
Reviewed echocardiogram from April 2023 and no significant change compared to echocardiogram from October 2022. ? ?Discontinue furosemide. ?Can trial torsemide, she will notify me which dose she has at home. ? ?Referral placed to vascular services for evaluation of edema. ?

## 2022-02-19 ENCOUNTER — Ambulatory Visit (INDEPENDENT_AMBULATORY_CARE_PROVIDER_SITE_OTHER): Payer: Medicare Other | Admitting: Vascular Surgery

## 2022-02-19 ENCOUNTER — Encounter (INDEPENDENT_AMBULATORY_CARE_PROVIDER_SITE_OTHER): Payer: Self-pay | Admitting: Vascular Surgery

## 2022-02-19 VITALS — BP 106/61 | HR 77 | Resp 15 | Wt 105.6 lb

## 2022-02-19 DIAGNOSIS — I5033 Acute on chronic diastolic (congestive) heart failure: Secondary | ICD-10-CM | POA: Diagnosis not present

## 2022-02-19 DIAGNOSIS — R600 Localized edema: Secondary | ICD-10-CM | POA: Diagnosis not present

## 2022-02-19 DIAGNOSIS — M7121 Synovial cyst of popliteal space [Baker], right knee: Secondary | ICD-10-CM | POA: Diagnosis not present

## 2022-02-19 NOTE — Assessment & Plan Note (Signed)
Cardiac dysfunction is a likely significant contributor to lower extremity swelling

## 2022-02-19 NOTE — Assessment & Plan Note (Addendum)
Recommend:  I have had a long discussion with the patient regarding swelling and why it  causes symptoms.  Patient will begin wearing graduated compression on a daily basis a prescription was given. The patient will  wear the stockings first thing in the morning and removing them in the evening. The patient is instructed specifically not to sleep in the stockings.   In addition, behavioral modification will be initiated.  This will include frequent elevation, use of over the counter pain medications and exercise such as walking.  The patient has had a negative venous work-up previously at the hospital. The patient appears to have significant lymphedema of the right lower extremity.  This appears to be stage II lymphedema.  It is refractory to elevation and the episodes where she has previously used compression socks.  At this point, a lymphedema pump would be an excellent adjuvant therapy to improve her lower extremity symptoms.  Discussed the pathophysiology and natural history of lymphedema.  We can see her back in 3 to 6 months

## 2022-02-19 NOTE — Assessment & Plan Note (Signed)
Contributes to LE swelling 

## 2022-02-19 NOTE — Progress Notes (Signed)
Patient ID: Crystal Gregory, female   DOB: Jun 15, 1932, 88 y.o.   MRN: [id number]  No chief complaint on file.   HPI Crystal Gregory is a 88 y.o. female.  I am asked to see the patient by Alma Friendly for evaluation of right leg swelling.  The patient has a longstanding history of heart disease and has been treated with diuretic therapy, but her swelling has persisted and gotten worse and is really just unilateral so her primary care physician astutely did a venous work-up which was negative and then referred her for further evaluation and treatment.  She has no known history of DVT or superficial thrombophlebitis to her knowledge.  No chest pain or shortness of breath.  No open wounds or ulceration.  No fever or chills.  The swelling goes down at night and she has essentially no swelling first thing in the morning.  Within an hour or 2, the right leg is significantly swollen.  She really has had no symptoms on the left leg.  Elevation does help.  She remains reasonably active.  Exacerbating the situation recently is the fact that she is dealing with the passing of her husband just a few weeks ago.  They were married for over 51 years.     Past Medical History:  Diagnosis Date   Allergy    Anal fissure    Aspiration into airway    CAP (community acquired pneumonia) 08/23/2017   Chest pain    a. 09/04/2018 MV: EF >65%. No ischemia/infarct.   Coronary artery calcification seen on CT scan    Diastolic dysfunction    a. 10/2016 Echo: EF 60-65%, no rwma, Gr1 DD, mild MR, nl RV fxn, mild to mod TR, PASP 60mHg.   Diverticulosis    Dyspnea    GERD (gastroesophageal reflux disease)    Hemorrhoids    Hyperplastic colon polyp    Interstitial lung disorders ([place]    Pulmonary fibrosis ([place]    [place]emia [place]   spent 4 weeks in ICU, intubated -? PNA   Vertebral fracture, pathological     Past Surgical History:  Procedure Laterality Date   ABDOMINAL HYSTERECTOMY     FLEXIBLE  BRONCHOSCOPY N/A 07/05/2016   Procedure: FLEXIBLE BRONCHOSCOPY;  Surgeon: VVilinda Boehringer MD;  Location: [facility] ORS;  Service: Cardiopulmonary;  Laterality: N/A;   IR RADIOLOGIST EVAL & MGMT  11/05/2021     Family History  Problem Relation Age of Onset   Parkinson's disease Mother    Colon polyps Mother 854          Ulcers Father    Rheumatic fever Father    Macular degeneration Sister    Healthy Son    Healthy Daughter       Social History   Tobacco Use   Smoking status: Former    Packs/day: 0.25    Years: 20.00    Pack years: 5.00    Types: Cigarettes    Quit date: 01/16/1981    Years since quitting: 41.1   Smokeless tobacco: Never  Vaping Use   Vaping Use: Never used  Substance Use Topics   Alcohol use: Yes    Alcohol/week: 7.0 standard drinks    Types: 7 Standard drinks or equivalent per week   Drug use: No     No Known Allergies  Current Outpatient Medications  Medication Sig Dispense Refill   Albuterol Sulfate, sensor, (PROAIR DIGIHALER) 108 (90 Base) MCG/ACT AEPB Inhale 2 puffs into the lungs every  6 (six) hours. 1 each 0   azaTHIOprine (IMURAN) 50 MG tablet TAKE ONE AND ONE-HALF TABLETS (75 MG) EVERY MORNING AND 1 TABLET (50 MG) EVERY EVENING 225 tablet 0   azithromycin (ZITHROMAX) 250 MG tablet Take 1 tablet 3x a week 30 each 3   denosumab (PROLIA) 60 MG/ML SOLN injection Inject 60 mg into the skin every 6 (six) months. Administer in upper arm, thigh, or abdomen     gabapentin (NEURONTIN) 100 MG capsule Take 2 capsules (200 mg total) by mouth at bedtime. 180 capsule 3   montelukast (SINGULAIR) 10 MG tablet Take 1 tablet (10 mg total) by mouth daily. 90 tablet 3   Multiple Minerals-Vitamins (CALCIUM & VIT D3 BONE HEALTH PO) Take 1 Dose by mouth daily.     pantoprazole (PROTONIX) 20 MG tablet Take 20 mg by mouth 2 (two) times daily.     predniSONE (DELTASONE) 1 MG tablet Take 2 tablets (2 mg total) by mouth daily with breakfast. 180 tablet 0   propranolol  (INDERAL) 10 MG tablet TAKE 1 TABLET TWICE A DAY FOR TREMOR 180 tablet 2   torsemide (DEMADEX) 20 MG tablet Take 10 mg by mouth 2 (two) times daily.     traZODone (DESYREL) 50 MG tablet Take 1 tablet (50 mg total) by mouth at bedtime as needed for sleep. For sleep. 90 tablet 0   vitamin B-12 (CYANOCOBALAMIN) 500 MCG tablet Take 500 mcg by mouth daily.     furosemide (LASIX) 20 MG tablet Take 2 tablets (40 mg total) by mouth daily. For leg swelling. (Patient not taking: Reported on 02/19/2022) 14 tablet 0   No current facility-administered medications for this visit.      REVIEW OF SYSTEMS (Negative unless checked)  Constitutional: '[]'$ Weight loss  '[]'$ Fever  '[]'$ Chills Cardiac: '[]'$ Chest pain   '[]'$ Chest pressure   '[]'$ Palpitations   '[]'$ Shortness of breath when laying flat   '[]'$ Shortness of breath at rest   '[x]'$ Shortness of breath with exertion. Vascular:  '[]'$ Pain in legs with walking   '[]'$ Pain in legs at rest   '[]'$ Pain in legs when laying flat   '[]'$ Claudication   '[]'$ Pain in feet when walking  '[]'$ Pain in feet at rest  '[]'$ Pain in feet when laying flat   '[]'$ History of DVT   '[]'$ Phlebitis   '[x]'$ Swelling in legs   '[]'$ Varicose veins   '[]'$ Non-healing ulcers Pulmonary:   '[]'$ Uses home oxygen   '[]'$ Productive cough   '[]'$ Hemoptysis   '[]'$ Wheeze  '[]'$ COPD   '[]'$ Asthma Neurologic:  '[]'$ Dizziness  '[]'$ Blackouts   '[]'$ Seizures   '[]'$ History of stroke   '[]'$ History of TIA  '[]'$ Aphasia   '[]'$ Temporary blindness   '[]'$ Dysphagia   '[]'$ Weakness or numbness in arms   '[]'$ Weakness or numbness in legs Musculoskeletal:  '[]'$ Arthritis   '[]'$ Joint swelling   '[x]'$ Joint pain   '[]'$ Low back pain Hematologic:  '[]'$ Easy bruising  '[]'$ Easy bleeding   '[]'$ Hypercoagulable state   '[]'$ Anemic  '[]'$ Hepatitis Gastrointestinal:  '[]'$ Blood in stool   '[]'$ Vomiting blood  '[]'$ Gastroesophageal reflux/heartburn   '[]'$ Abdominal pain Genitourinary:  '[]'$ Chronic kidney disease   '[]'$ Difficult urination  '[]'$ Frequent urination  '[]'$ Burning with urination   '[]'$ Hematuria Skin:  '[]'$ Rashes   '[]'$ Ulcers   '[]'$ Wounds Psychological:   '[]'$ History of anxiety   '[]'$  History of major depression.    Physical Exam BP 106/61 (BP Location: Right Arm)   Pulse 77   Resp 15   Wt 105 lb 9.6 oz (47.9 kg)   BMI 16.06 kg/m  Gen:  WD/WN, NAD. Appears younger than stated age. Head:  [place]/AT, No temporalis wasting.  Ear/Nose/Throat: Hearing grossly intact, nares w/o erythema or drainage, oropharynx w/o Erythema/Exudate Eyes: Conjunctiva clear, sclera non-icteric  Neck: trachea midline.  No JVD.  Pulmonary:  Good air movement, respirations not labored, no use of accessory muscles  Cardiac: RRR, no JVD Vascular:  Vessel Right Left  Radial Palpable Palpable                          DP 1+ 2+  PT Trace  1+   Gastrointestinal:. No masses, surgical incisions, or scars. Musculoskeletal: M/S 5/5 throughout.  Extremities without ischemic changes.  No deformity or atrophy. 1+ RLE edema.  Diffuse varicosities bilaterally Neurologic: Sensation grossly intact in extremities.  Symmetrical.  Speech is fluent. Motor exam as listed above. Psychiatric: Judgment intact, Mood & affect appropriate for pt's clinical situation. Dermatologic: No rashes or ulcers noted.  No cellulitis or open wounds.    Radiology DG Bone Density  Result Date: 01/25/2022 EXAM: DUAL X-RAY ABSORPTIOMETRY (DXA) FOR BONE MINERAL DENSITY IMPRESSION: Your patient Crystal Gregory completed a BMD test on 01/25/2022 using the [place] (software version: 14.10) manufactured by [employer]. The following summarizes the results of our evaluation. Technologist: SCE PATIENT BIOGRAPHICAL: Name: Crystal Gregory, Crystal Gregory Patient ID: [id number] Birth Date: Nov 03, 1931 Height: 66.5 in. Gender: Female Exam Date: 01/25/2022 Weight: 107.6 lbs. Indications: Advanced Age, Caucasian, COPD, Height Loss, History of Fracture (Adult), History of Osteoporosis, Hysterectomy, Long Term Prednisone Use, Postmenopausal, Rheumatoid Arthritis Fractures: Thoracic Spine Treatments: Calcium,  Multi-Vitamin, Prednisone, Prolia, Protonix, Singulair, Vitamin D DENSITOMETRY RESULTS: Site         Region     Measured Date Measured Age WHO Classification Young Adult T-score BMD         %Change vs. Previous Significant Change (*) AP Spine L1-L4 01/25/2024 88.0 Osteopenia -1.7 0.985 g/cm2 - - DualFemur Neck Right 01/25/2022 88.0 Osteopenia -1.4 0.847 g/cm2 7.4% Yes DualFemur Neck Right 01/22/2020 86.0 Osteopenia -1.8 0.789 g/cm2 - - DualFemur Total Mean 01/25/2022 88.0 Normal -0.9 0.893 g/cm2 4.9% Yes DualFemur Total Mean 01/22/2020 86.0 Osteopenia -1.2 0.851 g/cm2 - - Left Forearm Radius 33% 01/25/2022 88.0 Osteoporosis -2.8 0.629 g/cm2 4.1% - Left Forearm Radius 33% 01/22/2020 88.0 Osteoporosis -3.1 0.604 g/cm2 - - ASSESSMENT: The BMD measured at Forearm Radius 33% is 0.629 g/cm2 with a T-score of -2.8. This patient is considered osteoporotic according to [facility]) criteria. The scan quality is good. Compared with prior study, there has been a significant increase in the total hip. [facility] Pharmac[facility]) criteria for post-menopausal, Caucasian Women: Normal:                   T-score at or above -1 SD Osteopenia/low bone mass: T-score between -1 and -2.5 SD Osteoporosis:             T-score at or below -2.5 SD RECOMMENDATIONS: 1. All patients should optimize calcium and vitamin D intake. 2. Consider FDA-approved medical therapies in postmenopausal women and men aged 80 years and older, based on the following: a. A hip or vertebral(clinical or morphometric) fracture b. T-score < -2.5 at the femoral neck or spine after appropriate evaluation to exclude secondary causes c. Low bone mass (T-score between -1.0 and -2.5 at the femoral neck or spine) and a 10-year probability of a hip fracture > 3% or a 10-year probability of a major osteoporosis-related fracture > 20% based on the [place]-adapted WHO algorithm 3. Clinician  judgment and/or patient preferences may indicate treatment for people  with 10-year fracture probabilities above or below these levels FOLLOW-UP: People with diagnosed cases of osteoporosis or at high risk for fracture should have regular bone mineral density tests. For patients eligible for Medicare, routine testing is allowed once every 2 years. The testing frequency can be increased to one year for patients who have rapidly progressing disease, those who are receiving or discontinuing medical therapy to restore bone mass, or have additional risk factors. I have reviewed this report, and agree with the above findings. Mark A. Thornton Papas, M.D. [facility] Electronically Signed   By: Lavonia Dana M.D.   On: 01/25/2022 09:38   ECHOCARDIOGRAM COMPLETE  Result Date: 01/21/2022    ECHOCARDIOGRAM REPORT   Patient Name:   Crystal Gregory Date of Exam: 01/21/2022 Medical Rec #:  [id number]      Height:       68.0 in Accession #:    [id number]     Weight:       108.0 lb Date of Birth:  October 21, 1931      BSA:          1.573 m Patient Age:    14 years       BP:           118/72 mmHg Patient Gender: F              HR:           74 bpm. Exam Location:  [place] Procedure: 2D Echo, Cardiac Doppler, Color Doppler and Strain Analysis Indications:    I27.20 Pulmonary Hypertension; R06.02 SOB  History:        Patient has prior history of Echocardiogram examinations, most                 recent 07/09/2021. Elevated BNP, Pulmonary HTN and Pulmo0nary                 fibrosis, Tricuspid regurgitation, Signs/Symptoms:Dyspnea and                 Edema; Risk Factors:Former Smoker.  Sonographer:    Pilar Jarvis RDMS, RVT, RDCS Referring Phys: [id number] Pleas Koch  Sonographer Comments: Technically challenging study due to limited acoustic windows. IMPRESSIONS  1. Left ventricular ejection fraction, by estimation, is 60 to 65%. The left ventricle has normal function. The left ventricle has no regional wall motion abnormalities. Left ventricular diastolic parameters are consistent with Grade I  diastolic dysfunction (impaired relaxation). The average left ventricular global longitudinal strain is -20.3 %. The global longitudinal strain is normal.  2. Right ventricular systolic function is normal. The right ventricular size is normal. There is mildly elevated pulmonary artery systolic pressure. The estimated right ventricular systolic pressure is 73.4 mmHg.  3. Right atrial size was mildly dilated.  4. The mitral valve is normal in structure. No evidence of mitral valve regurgitation.  5. The aortic valve is tricuspid. Aortic valve regurgitation is not visualized.  6. The inferior vena cava is normal in size with <50% respiratory variability, suggesting right atrial pressure of 8 mmHg. FINDINGS  Left Ventricle: Left ventricular ejection fraction, by estimation, is 60 to 65%. The left ventricle has normal function. The left ventricle has no regional wall motion abnormalities. The average left ventricular global longitudinal strain is -20.3 %. The global longitudinal strain is normal. The left ventricular internal cavity size was normal in size. There is no left ventricular hypertrophy. Left ventricular diastolic parameters  are consistent with Grade I diastolic dysfunction (impaired relaxation). Right Ventricle: The right ventricular size is normal. No increase in right ventricular wall thickness. Right ventricular systolic function is normal. There is mildly elevated pulmonary artery systolic pressure. The tricuspid regurgitant velocity is 2.73  m/s, and with an assumed right atrial pressure of 8 mmHg, the estimated right ventricular systolic pressure is 14.4 mmHg. Left Atrium: Left atrial size was normal in size. Right Atrium: Right atrial size was mildly dilated. Pericardium: There is no evidence of pericardial effusion. Mitral Valve: The mitral valve is normal in structure. No evidence of mitral valve regurgitation. Tricuspid Valve: The tricuspid valve is normal in structure. Tricuspid valve regurgitation  is mild. Aortic Valve: The aortic valve is tricuspid. Aortic valve regurgitation is not visualized. Aortic valve mean gradient measures 5.0 mmHg. Aortic valve peak gradient measures 8.2 mmHg. Aortic valve area, by VTI measures 2.37 cm. Pulmonic Valve: The pulmonic valve was not well visualized. Pulmonic valve regurgitation is not visualized. Aorta: The aortic root is normal in size and structure. Venous: The inferior vena cava is normal in size with less than 50% respiratory variability, suggesting right atrial pressure of 8 mmHg. IAS/Shunts: No atrial level shunt detected by color flow Doppler.  LEFT VENTRICLE PLAX 2D LVIDd:         4.00 cm     Diastology LVIDs:         2.50 cm     LV e' medial:    5.33 cm/s LV PW:         0.80 cm     LV E/e' medial:  16.3 LV IVS:        0.80 cm     LV e' lateral:   7.07 cm/s LVOT diam:     1.80 cm     LV E/e' lateral: 12.3 LV SV:         78 LV SV Index:   50          2D Longitudinal Strain LVOT Area:     2.54 cm    2D Strain GLS Avg:     -20.3 %  LV Volumes (MOD) LV vol d, MOD A2C: 56.4 ml LV vol d, MOD A4C: 50.4 ml LV vol s, MOD A2C: 24.0 ml LV vol s, MOD A4C: 22.9 ml LV SV MOD A2C:     32.4 ml LV SV MOD A4C:     50.4 ml LV SV MOD BP:      30.0 ml RIGHT VENTRICLE             IVC RV Basal diam:  3.20 cm     IVC diam: 1.60 cm RV S prime:     13.80 cm/s TAPSE (M-mode): 2.7 cm LEFT ATRIUM             Index        RIGHT ATRIUM           Index LA diam:        3.80 cm 2.42 cm/m   RA Area:     15.80 cm LA Vol (A2C):   52.3 ml 33.25 ml/m  RA Volume:   39.80 ml  25.30 ml/m LA Vol (A4C):   47.0 ml 29.88 ml/m LA Biplane Vol: 50.3 ml 31.98 ml/m  AORTIC VALVE                     PULMONIC VALVE AV Area (Vmax):    2.47 cm  PV Vmax:       0.78 m/s AV Area (Vmean):   2.32 cm      PV Peak grad:  2.4 mmHg AV Area (VTI):     2.37 cm AV Vmax:           143.00 cm/s AV Vmean:          102.000 cm/s AV VTI:            0.329 m AV Peak Grad:      8.2 mmHg AV Mean Grad:      5.0 mmHg LVOT  Vmax:         139.00 cm/s LVOT Vmean:        93.000 cm/s LVOT VTI:          0.306 m LVOT/AV VTI ratio: 0.93  AORTA Ao Root diam: 3.50 cm MITRAL VALVE                TRICUSPID VALVE MV Area (PHT): 3.08 cm     TR Peak grad:   29.8 mmHg MV Decel Time: 246 msec     TR Vmax:        273.00 cm/s MV E velocity: 87.00 cm/s MV A velocity: 108.00 cm/s  SHUNTS MV E/A ratio:  0.81         Systemic VTI:  0.31 m                             Systemic Diam: 1.80 cm Kate Sable MD Electronically signed by Kate Sable MD Signature Date/Time: 01/21/2022/4:11:21 PM    Final     Labs Recent Results (from the past 2160 hour(s))  Iron and TIBC([facility])     Status: None   Collection Time: 01/04/22  9:57 AM  Result Value Ref Range   Iron 77 28 - 170 ug/dL   TIBC 300 250 - 450 ug/dL   Saturation Ratios 26 10.4 - 31.8 %   UIBC 223 ug/dL    Comment: Performed at [facility], [address] [place], [place] [address]  Ferritin     Status: None   Collection Time: 01/04/22  9:57 AM  Result Value Ref Range   Ferritin 32 11 - 307 ng/mL    Comment: Performed at [facility], [address] [place],  [address]  CBC with Differential/Platelet     Status: Abnormal   Collection Time: 01/04/22  9:57 AM  Result Value Ref Range   WBC 6.3 4.0 - 10.5 K/uL   RBC 2.74 (L) 3.87 - 5.11 MIL/uL   Hemoglobin 10.6 (L) 12.0 - 15.0 g/dL   HCT 31.9 (L) 36.0 - 46.0 %   MCV 116.4 (H) 80.0 - 100.0 fL   MCH 38.7 (H) 26.0 - 34.0 pg   MCHC 33.2 30.0 - 36.0 g/dL   RDW 15.8 (H) 11.5 - 15.5 %   Platelets 313 150 - 400 K/uL   nRBC 0.0 0.0 - 0.2 %   Neutrophils Relative % 64 %   Neutro Abs 4.1 1.7 - 7.7 K/uL   Lymphocytes Relative 25 %   Lymphs Abs 1.6 0.7 - 4.0 K/uL   Monocytes Relative 8 %   Monocytes Absolute 0.5 0.1 - 1.0 K/uL   Eosinophils Relative 1 %   Eosinophils Absolute 0.1 0.0 - 0.5 K/uL   Basophils Relative 1 %   Basophils Absolute 0.0 0.0 - 0.1 K/uL   Immature Granulocytes 1 %  Abs Immature Granulocytes 0.03 0.00 - 0.07 K/uL    Comment: Performed at [facility], [address] [place], [place] [address]  Vitamin B12     Status: Abnormal   Collection Time: 01/04/22  9:57 AM  Result Value Ref Range   Vitamin B-12 935 (H) 180 - 914 pg/mL    Comment: (NOTE) This assay is not validated for testing neonatal or myeloproliferative syndrome specimens for Vitamin B12 levels. Performed at [facility], [place] [address], [place], [place] [address]   Brain natriuretic peptide     Status: Abnormal   Collection Time: 01/14/22  1:39 PM  Result Value Ref Range   Pro B Natriuretic peptide (BNP) 384.0 (H) 0.0 - 100.0 pg/mL  Basic metabolic panel     Status: Abnormal   Collection Time: 01/14/22  1:39 PM  Result Value Ref Range   Sodium 135 135 - 145 mEq/L   Potassium 3.8 3.5 - 5.1 mEq/L   Chloride 101 96 - 112 mEq/L   CO2 25 19 - 32 mEq/L   Glucose, Bld 168 (H) 70 - 99 mg/dL   BUN 22 6 - 23 mg/dL   Creatinine, Ser 0.67 0.40 - 1.20 mg/dL   GFR 78.24 >60.00 mL/min    Comment: Calculated using the CKD-EPI Creatinine Equation (2021)   Calcium 8.8 8.4 - 10.5 mg/dL  Basic metabolic panel     Status: Abnormal   Collection Time: 01/20/22  1:08 PM  Result Value Ref Range   Sodium 135 135 - 145 mEq/L   Potassium 4.1 3.5 - 5.1 mEq/L   Chloride 99 96 - 112 mEq/L   CO2 27 19 - 32 mEq/L   Glucose, Bld 105 (H) 70 - 99 mg/dL   BUN 17 6 - 23 mg/dL   Creatinine, Ser 0.66 0.40 - 1.20 mg/dL   GFR 78.51 >60.00 mL/min    Comment: Calculated using the CKD-EPI Creatinine Equation (2021)   Calcium 8.9 8.4 - 10.5 mg/dL  Brain natriuretic peptide     Status: Abnormal   Collection Time: 01/20/22  1:08 PM  Result Value Ref Range   Pro B Natriuretic peptide (BNP) 306.0 (H) 0.0 - 100.0 pg/mL  ECHOCARDIOGRAM COMPLETE     Status: None   Collection Time: 01/21/22 12:14 PM  Result Value Ref Range   AR max vel 2.47 cm2   AV Peak grad 8.2 mmHg   Ao pk vel 1.43 m/s   S' Lateral 2.50  cm   Area-P 1/2 3.08 cm2   AV Area VTI 2.37 cm2   AV Mean grad 5.0 mmHg   Single Plane A4C EF 54.6 %   Single Plane A2C EF 57.4 %   Calc EF 56.4 %   AV Area mean vel 2.32 cm2  Basic metabolic panel     Status: None   Collection Time: 01/29/22 11:15 AM  Result Value Ref Range   Sodium 135 135 - 145 mEq/L   Potassium 3.6 3.5 - 5.1 mEq/L   Chloride 98 96 - 112 mEq/L   CO2 28 19 - 32 mEq/L   Glucose, Bld 93 70 - 99 mg/dL   BUN 20 6 - 23 mg/dL   Creatinine, Ser 0.70 0.40 - 1.20 mg/dL   GFR 77.39 >60.00 mL/min    Comment: Calculated using the CKD-EPI Creatinine Equation (2021)   Calcium 9.1 8.4 - 10.5 mg/dL  Brain natriuretic peptide     Status: Abnormal   Collection Time: 01/29/22 11:15 AM  Result  Value Ref Range   Pro B Natriuretic peptide (BNP) 363.0 (H) 0.0 - 100.0 pg/mL    Assessment/Plan:  Baker's cyst, right Contributes to LE swelling  Diastolic dysfunction with acute on chronic heart failure (HCC) Cardiac dysfunction is a likely significant contributor to lower extremity swelling  Edema of right lower extremity Recommend:  I have had a long discussion with the patient regarding swelling and why it  causes symptoms.  Patient will begin wearing graduated compression on a daily basis a prescription was given. The patient will  wear the stockings first thing in the morning and removing them in the evening. The patient is instructed specifically not to sleep in the stockings.   In addition, behavioral modification will be initiated.  This will include frequent elevation, use of over the counter pain medications and exercise such as walking.  The patient has had a negative venous work-up previously at the hospital. The patient appears to have significant lymphedema of the right lower extremity.  This appears to be stage II lymphedema.  It is refractory to elevation and the episodes where she has previously used compression socks.  At this point, a lymphedema pump would be an  excellent adjuvant therapy to improve her lower extremity symptoms.  Discussed the pathophysiology and natural history of lymphedema.  We can see her back in 3 to 6 months       Leotis Pain 02/19/2022, 11:16 AM   This note was created with Dragon medical transcription system.  Any errors from dictation are unintentional.

## 2022-02-23 DIAGNOSIS — M4854XD Collapsed vertebra, not elsewhere classified, thoracic region, subsequent encounter for fracture with routine healing: Secondary | ICD-10-CM | POA: Diagnosis not present

## 2022-02-23 DIAGNOSIS — M4854XS Collapsed vertebra, not elsewhere classified, thoracic region, sequela of fracture: Secondary | ICD-10-CM | POA: Diagnosis not present

## 2022-02-25 DIAGNOSIS — M4854XD Collapsed vertebra, not elsewhere classified, thoracic region, subsequent encounter for fracture with routine healing: Secondary | ICD-10-CM | POA: Diagnosis not present

## 2022-02-25 DIAGNOSIS — M4854XS Collapsed vertebra, not elsewhere classified, thoracic region, sequela of fracture: Secondary | ICD-10-CM | POA: Diagnosis not present

## 2022-02-26 ENCOUNTER — Encounter: Payer: Self-pay | Admitting: Rheumatology

## 2022-02-26 NOTE — Telephone Encounter (Signed)
Next Visit: 03/15/2022  Last Visit: 10/15/2021  Last Fill: 11/24/2021  DX: Seropositive rheumatoid arthritis   Current Dose per office note 10/15/2021: Imuran 75 mg every morning and 50 mg in the evening since 2019.   Labs: 01/04/2022 CBC: RBC 2.74, Hgb 10.6, Hct 31.9, MCV 116.4, MCH 38.7, RDW 15.8  Okay to refill Imuran?

## 2022-02-27 DIAGNOSIS — K219 Gastro-esophageal reflux disease without esophagitis: Secondary | ICD-10-CM

## 2022-02-27 MED ORDER — AZATHIOPRINE 50 MG PO TABS: ORAL_TABLET | ORAL | 0 refills | Status: DC

## 2022-03-02 DIAGNOSIS — M4854XS Collapsed vertebra, not elsewhere classified, thoracic region, sequela of fracture: Secondary | ICD-10-CM | POA: Diagnosis not present

## 2022-03-02 DIAGNOSIS — M4854XD Collapsed vertebra, not elsewhere classified, thoracic region, subsequent encounter for fracture with routine healing: Secondary | ICD-10-CM | POA: Diagnosis not present

## 2022-03-02 MED ORDER — PANTOPRAZOLE SODIUM 20 MG PO TBEC: 20.0000 mg | DELAYED_RELEASE_TABLET | Freq: Two times a day (BID) | ORAL | 3 refills | Status: DC

## 2022-03-04 DIAGNOSIS — M4854XS Collapsed vertebra, not elsewhere classified, thoracic region, sequela of fracture: Secondary | ICD-10-CM | POA: Diagnosis not present

## 2022-03-04 DIAGNOSIS — M4854XD Collapsed vertebra, not elsewhere classified, thoracic region, subsequent encounter for fracture with routine healing: Secondary | ICD-10-CM | POA: Diagnosis not present

## 2022-03-05 NOTE — Progress Notes (Deleted)
Office Visit Note  Patient: Crystal Gregory             Date of Birth: 1931/11/17           MRN: [id number]             PCP: Pleas Koch, NP Referring: Pleas Koch, NP Visit Date: 03/19/2022 Occupation: '@GUAROCC'$ @  Subjective:  No chief complaint on file.   History of Present Illness: Crystal Gregory is a 88 y.o. female ***   Activities of Daily Living:  Patient reports morning stiffness for *** {minute/hour:19697}.   Patient {ACTIONS;DENIES/REPORTS:21021675::"Denies"} nocturnal pain.  Difficulty dressing/grooming: {ACTIONS;DENIES/REPORTS:21021675::"Denies"} Difficulty climbing stairs: {ACTIONS;DENIES/REPORTS:21021675::"Denies"} Difficulty getting out of chair: {ACTIONS;DENIES/REPORTS:21021675::"Denies"} Difficulty using hands for taps, buttons, cutlery, and/or writing: {ACTIONS;DENIES/REPORTS:21021675::"Denies"}  No Rheumatology ROS completed.   PMFS History:  Patient Active Problem List   Diagnosis Date Noted   Elevated brain natriuretic peptide (BNP) level 01/29/2022   Encounter for therapeutic drug monitoring 01/29/2022   Edema of right lower extremity 92/42/6834   Diastolic dysfunction with acute on chronic heart failure ([place]) 01/29/2022   Baker's cyst, right 01/14/2022   Asymptomatic varicose veins of both lower extremities 01/14/2022   DOE (dyspnea on exertion) 01/14/2022   Compression fracture of thoracic spine, non-traumatic ([place]) 11/26/2021   Tremor of both hands 11/26/2021   B12 deficiency 09/02/2020   Anemia 08/06/2020   TMJ (temporomandibular joint syndrome) 03/02/2018   RA (rheumatoid arthritis) ([place]) 07/13/2017   Polyarthralgia 04/25/2017   ILD (interstitial lung disease) ([place]) 06/18/2016   IPF (idiopathic pulmonary fibrosis) ([place]) 03/23/2016   Bronchiectasis without complication ([place]) 19/62/2297   Allergic rhinitis 03/22/2016   Anal fissure 08/18/2015   Insomnia 08/18/2015   Osteoporosis 02/13/2015   Stress due to illness of family member  09/16/2014   Hiatal hernia 10/13/2012   GERD (gastroesophageal reflux disease)    Allergy     Past Medical History:  Diagnosis Date   Allergy    Anal fissure    Aspiration into airway    CAP (community acquired pneumonia) 08/23/2017   Chest pain    a. 09/04/2018 MV: EF >65%. No ischemia/infarct.   Coronary artery calcification seen on CT scan    Diastolic dysfunction    a. 10/2016 Echo: EF 60-65%, no rwma, Gr1 DD, mild MR, nl RV fxn, mild to mod TR, PASP 14mHg.   Diverticulosis    Dyspnea    GERD (gastroesophageal reflux disease)    Hemorrhoids    Hyperplastic colon polyp    Interstitial lung disorders ([place]    Pulmonary fibrosis ([facility]    Septicemia ([place] 1999   spent 4 weeks in ICU, intubated -? PNA   Vertebral fracture, pathological     Family History  Problem Relation Age of Onset   Parkinson's disease Mother    Colon polyps Mother 844          Ulcers Father    Rheumatic fever Father    Macular degeneration Sister    Healthy Son    Healthy Daughter    Past Surgical History:  Procedure Laterality Date   ABDOMINAL HYSTERECTOMY     FLEXIBLE BRONCHOSCOPY N/A 07/05/2016   Procedure: FLEXIBLE BRONCHOSCOPY;  Surgeon: VVilinda Boehringer MD;  Location: [facility] ORS;  Service: Cardiopulmonary;  Laterality: N/A;   IR RADIOLOGIST EVAL & MGMT  11/05/2021   Social History   Social History Narrative   Married.     2 children.  [place]   Immunization History  Administered Date(s) Administered   Fluad Quad(high Dose 65+) 07/24/2019   Influenza Split 06/04/2012   Influenza, High Dose Seasonal PF 09/10/2016   Influenza,inj,Quad PF,6+ Mos 07/04/2013, 08/18/2015, 07/13/2017, 06/19/2018   Influenza-Unspecified 07/15/2020   Moderna SARS-COV2 Booster Vaccination 05/19/2020, 02/19/2021   Moderna Sars-Covid-2 Vaccination 10/22/2019, 11/19/2019   Pneumococcal Conjugate-13 10/06/2011   Pneumococcal Polysaccharide-23 06/19/2018   Zoster, Live 01/14/2007      Objective: Vital Signs: There were no vitals taken for this visit.   Physical Exam   Musculoskeletal Exam: ***  CDAI Exam: CDAI Score: -- Patient Global: --; Provider Global: -- Swollen: --; Tender: -- Joint Exam 03/19/2022   No joint exam has been documented for this visit   There is currently no information documented on the homunculus. Go to the Rheumatology activity and complete the homunculus joint exam.  Investigation: No additional findings.  Imaging: No results found.  Recent Labs: Lab Results  Component Value Date   WBC 6.3 01/04/2022   HGB 10.6 (L) 01/04/2022   PLT 313 01/04/2022   NA 135 01/29/2022   K 3.6 01/29/2022   CL 98 01/29/2022   CO2 28 01/29/2022   GLUCOSE 93 01/29/2022   BUN 20 01/29/2022   CREATININE 0.70 01/29/2022   BILITOT 0.8 10/15/2021   ALKPHOS 39 12/30/2020   AST 14 10/15/2021   ALT 6 10/15/2021   PROT 7.1 10/15/2021   ALBUMIN 3.9 12/30/2020   CALCIUM 9.1 01/29/2022   GFRAA 91 03/06/2021   QFTBGOLDPLUS NEGATIVE 03/06/2021    Speciality Comments: Plaquenil and leflunomide-inadequate response Imuran 125 mg p.o. daily started May 2019.  We discussed switching to CellCept which she declined.  Procedures:  No procedures performed Allergies: Patient has no known allergies.   Assessment / Plan:     Visit Diagnoses: No diagnosis found.  Orders: No orders of the defined types were placed in this encounter.  No orders of the defined types were placed in this encounter.   Face-to-face time spent with patient was *** minutes. Greater than 50% of time was spent in counseling and coordination of care.  Follow-Up Instructions: No follow-ups on file.   Earnestine Mealing, CMA  Note - This record has been created using Editor, commissioning.  Chart creation errors have been sought, but may not always  have been located. Such creation errors do not reflect on  the standard of medical care.

## 2022-03-06 ENCOUNTER — Encounter: Payer: Self-pay | Admitting: Rheumatology

## 2022-03-07 ENCOUNTER — Other Ambulatory Visit: Payer: Self-pay | Admitting: *Deleted

## 2022-03-07 NOTE — Telephone Encounter (Signed)
Next Visit: 03/19/2022  Last Visit: 10/15/2021  Last Fill:11/30/2021  Dx: Seropositive rheumatoid arthritis   Current Dose per office note on 10/15/2021: Prednisone 2 mg p.o. daily  Okay to refill predisone?

## 2022-03-08 MED ORDER — PREDNISONE 1 MG PO TABS: 2.0000 mg | ORAL_TABLET | Freq: Every day | ORAL | 0 refills | Status: DC

## 2022-03-09 DIAGNOSIS — M4854XD Collapsed vertebra, not elsewhere classified, thoracic region, subsequent encounter for fracture with routine healing: Secondary | ICD-10-CM | POA: Diagnosis not present

## 2022-03-09 DIAGNOSIS — M4854XS Collapsed vertebra, not elsewhere classified, thoracic region, sequela of fracture: Secondary | ICD-10-CM | POA: Diagnosis not present

## 2022-03-12 DIAGNOSIS — M4854XD Collapsed vertebra, not elsewhere classified, thoracic region, subsequent encounter for fracture with routine healing: Secondary | ICD-10-CM | POA: Diagnosis not present

## 2022-03-12 DIAGNOSIS — M4854XS Collapsed vertebra, not elsewhere classified, thoracic region, sequela of fracture: Secondary | ICD-10-CM | POA: Diagnosis not present

## 2022-03-15 ENCOUNTER — Ambulatory Visit: Payer: TRICARE For Life (TFL) | Admitting: Physician Assistant

## 2022-03-16 ENCOUNTER — Telehealth: Payer: Self-pay | Admitting: Rheumatology

## 2022-03-16 DIAGNOSIS — M4854XD Collapsed vertebra, not elsewhere classified, thoracic region, subsequent encounter for fracture with routine healing: Secondary | ICD-10-CM | POA: Diagnosis not present

## 2022-03-16 DIAGNOSIS — M4854XS Collapsed vertebra, not elsewhere classified, thoracic region, sequela of fracture: Secondary | ICD-10-CM | POA: Diagnosis not present

## 2022-03-16 NOTE — Telephone Encounter (Signed)
Attempted to contact the patient. First time I called it sounded like someone answered the phone but never spoke. Second time I called the phone was busy.

## 2022-03-16 NOTE — Telephone Encounter (Signed)
Patient called to cancel her 5 month follow-up appointment on Friday, 03/19/22.  Patient states her husband drove her to appointments and he passed away in 23-Feb-2023.  Patient states her daughter has to work in [facility] and is unable to bring her.  Patient states she had labwork with her PCP in April.  Patient is not sure when she will be able to reschedule and might need to find a Rheumatologist in the [place] area.

## 2022-03-17 NOTE — Telephone Encounter (Signed)
Attempted to contact the patient and left message for patient to call the office. Advised patient to let us know if she would like [place] to place referral to rheumatology in [place].

## 2022-03-19 ENCOUNTER — Ambulatory Visit: Payer: TRICARE For Life (TFL) | Admitting: Physician Assistant

## 2022-03-19 DIAGNOSIS — Z8719 Personal history of other diseases of the digestive system: Secondary | ICD-10-CM

## 2022-03-19 DIAGNOSIS — M26609 Unspecified temporomandibular joint disorder, unspecified side: Secondary | ICD-10-CM

## 2022-03-19 DIAGNOSIS — J84112 Idiopathic pulmonary fibrosis: Secondary | ICD-10-CM

## 2022-03-19 DIAGNOSIS — Z79899 Other long term (current) drug therapy: Secondary | ICD-10-CM

## 2022-03-19 DIAGNOSIS — M059 Rheumatoid arthritis with rheumatoid factor, unspecified: Secondary | ICD-10-CM

## 2022-03-19 DIAGNOSIS — J479 Bronchiectasis, uncomplicated: Secondary | ICD-10-CM

## 2022-03-19 DIAGNOSIS — M4854XS Collapsed vertebra, not elsewhere classified, thoracic region, sequela of fracture: Secondary | ICD-10-CM | POA: Diagnosis not present

## 2022-03-19 DIAGNOSIS — K449 Diaphragmatic hernia without obstruction or gangrene: Secondary | ICD-10-CM

## 2022-03-19 DIAGNOSIS — S22080D Wedge compression fracture of T11-T12 vertebra, subsequent encounter for fracture with routine healing: Secondary | ICD-10-CM

## 2022-03-19 DIAGNOSIS — M4854XD Collapsed vertebra, not elsewhere classified, thoracic region, subsequent encounter for fracture with routine healing: Secondary | ICD-10-CM | POA: Diagnosis not present

## 2022-03-19 DIAGNOSIS — M810 Age-related osteoporosis without current pathological fracture: Secondary | ICD-10-CM

## 2022-03-19 DIAGNOSIS — E538 Deficiency of other specified B group vitamins: Secondary | ICD-10-CM

## 2022-03-19 DIAGNOSIS — J849 Interstitial pulmonary disease, unspecified: Secondary | ICD-10-CM

## 2022-03-19 NOTE — Telephone Encounter (Signed)
Patient states she will call the office when she is ready to be referred. Patient is aware that we are unable to refill her medication at this time as she needs an updated CMP.

## 2022-03-23 DIAGNOSIS — M4854XS Collapsed vertebra, not elsewhere classified, thoracic region, sequela of fracture: Secondary | ICD-10-CM | POA: Diagnosis not present

## 2022-03-23 DIAGNOSIS — M4854XD Collapsed vertebra, not elsewhere classified, thoracic region, subsequent encounter for fracture with routine healing: Secondary | ICD-10-CM | POA: Diagnosis not present

## 2022-03-25 ENCOUNTER — Ambulatory Visit (INDEPENDENT_AMBULATORY_CARE_PROVIDER_SITE_OTHER): Payer: Medicare Other | Admitting: Family

## 2022-03-25 ENCOUNTER — Encounter: Payer: Self-pay | Admitting: Family

## 2022-03-25 VITALS — BP 112/58 | HR 78 | Temp 97.9°F | Resp 16 | Ht 68.0 in | Wt 108.4 lb

## 2022-03-25 DIAGNOSIS — M2550 Pain in unspecified joint: Secondary | ICD-10-CM

## 2022-03-25 DIAGNOSIS — M069 Rheumatoid arthritis, unspecified: Secondary | ICD-10-CM | POA: Diagnosis not present

## 2022-03-25 DIAGNOSIS — M4854XD Collapsed vertebra, not elsewhere classified, thoracic region, subsequent encounter for fracture with routine healing: Secondary | ICD-10-CM | POA: Diagnosis not present

## 2022-03-25 DIAGNOSIS — R600 Localized edema: Secondary | ICD-10-CM | POA: Diagnosis not present

## 2022-03-25 DIAGNOSIS — M4854XS Collapsed vertebra, not elsewhere classified, thoracic region, sequela of fracture: Secondary | ICD-10-CM | POA: Diagnosis not present

## 2022-03-25 DIAGNOSIS — R7989 Other specified abnormal findings of blood chemistry: Secondary | ICD-10-CM

## 2022-03-25 MED ORDER — TORSEMIDE 20 MG PO TABS: 10.0000 mg | ORAL_TABLET | Freq: Two times a day (BID) | ORAL | 0 refills | Status: DC

## 2022-03-25 MED ORDER — TORSEMIDE 10 MG PO TABS: 10.0000 mg | ORAL_TABLET | Freq: Every day | ORAL | Status: DC

## 2022-03-25 NOTE — Assessment & Plan Note (Signed)
turosemide 10 mg tablet daily  Will keep low due to risk for hypotension as bp already on lower end  F/u one week with pcp for repeat potassium and eval of le edema

## 2022-03-25 NOTE — Assessment & Plan Note (Signed)
Pt wanting referral to rheum closer to home, drive to [place] too far for her.

## 2022-03-25 NOTE — Patient Instructions (Addendum)
A referral was placed today for rheumatology. Please let us know if you have not heard back within 2 weeks about the referral.  Restart turosemide 10 mg once daily. Watch blood pressure. Follow up with kate next week for f/u on leg and repeat potassium.  Wear compression stockings as able Work on elevating with foot on pillow for extra elevation  Call and inquire about lymphadema pumps from vascular    Due to recent changes in healthcare laws, you may see results of your imaging and/or laboratory studies on MyChart before I have had a chance to review them.  I understand that in some cases there may be results that are confusing or concerning to you. Please understand that not all results are received at the same time and often I may need to interpret multiple results in order to provide you with the best plan of care or course of treatment. Therefore, I ask that you please give me 2 business days to thoroughly review all your results before contacting my office for clarification. Should we see a critical lab result, you will be contacted sooner.   It was a pleasure seeing you today! Please do not hesitate to reach out with any questions and or concerns.  Regards,   Eugenia Pancoast FNP-C

## 2022-03-25 NOTE — Assessment & Plan Note (Signed)
Saw vascular doctor suspected lymphadema. Have advised pt to f/u with vascular to inquire about lymphadema pumps. Unable to wear compression stockings, did suggest that possibly have a less compressible sock that she can grip and pull up.

## 2022-03-26 ENCOUNTER — Telehealth (INDEPENDENT_AMBULATORY_CARE_PROVIDER_SITE_OTHER): Payer: Self-pay | Admitting: Vascular Surgery

## 2022-03-26 NOTE — Telephone Encounter (Signed)
Patient LVM regarding a prescription that Dr. Wyn Quaker gave her and she states she have not seen.  Please advise.

## 2022-03-30 DIAGNOSIS — M4854XD Collapsed vertebra, not elsewhere classified, thoracic region, subsequent encounter for fracture with routine healing: Secondary | ICD-10-CM | POA: Diagnosis not present

## 2022-03-30 DIAGNOSIS — M4854XS Collapsed vertebra, not elsewhere classified, thoracic region, sequela of fracture: Secondary | ICD-10-CM | POA: Diagnosis not present

## 2022-04-01 ENCOUNTER — Encounter: Payer: Self-pay | Admitting: Family

## 2022-04-01 ENCOUNTER — Telehealth: Payer: Self-pay

## 2022-04-01 DIAGNOSIS — M4854XD Collapsed vertebra, not elsewhere classified, thoracic region, subsequent encounter for fracture with routine healing: Secondary | ICD-10-CM | POA: Diagnosis not present

## 2022-04-01 DIAGNOSIS — M4854XS Collapsed vertebra, not elsewhere classified, thoracic region, sequela of fracture: Secondary | ICD-10-CM | POA: Diagnosis not present

## 2022-04-01 NOTE — Telephone Encounter (Signed)
ERROR

## 2022-04-02 ENCOUNTER — Ambulatory Visit: Payer: Medicare Other | Admitting: Family

## 2022-04-04 ENCOUNTER — Encounter: Payer: Self-pay | Admitting: Family

## 2022-04-04 DIAGNOSIS — G4700 Insomnia, unspecified: Secondary | ICD-10-CM

## 2022-04-05 MED ORDER — TRAZODONE HCL 50 MG PO TABS: 50.0000 mg | ORAL_TABLET | Freq: Every evening | ORAL | 2 refills | Status: DC | PRN

## 2022-04-06 DIAGNOSIS — M4854XD Collapsed vertebra, not elsewhere classified, thoracic region, subsequent encounter for fracture with routine healing: Secondary | ICD-10-CM | POA: Diagnosis not present

## 2022-04-06 DIAGNOSIS — M4854XS Collapsed vertebra, not elsewhere classified, thoracic region, sequela of fracture: Secondary | ICD-10-CM | POA: Diagnosis not present

## 2022-04-08 ENCOUNTER — Encounter: Payer: Self-pay | Admitting: Pulmonary Disease

## 2022-04-08 ENCOUNTER — Ambulatory Visit (INDEPENDENT_AMBULATORY_CARE_PROVIDER_SITE_OTHER): Payer: Medicare Other | Admitting: Pulmonary Disease

## 2022-04-08 VITALS — BP 112/64 | HR 83 | Temp 97.8°F | Ht 68.0 in | Wt 106.6 lb

## 2022-04-08 DIAGNOSIS — J849 Interstitial pulmonary disease, unspecified: Secondary | ICD-10-CM | POA: Diagnosis not present

## 2022-04-08 DIAGNOSIS — J479 Bronchiectasis, uncomplicated: Secondary | ICD-10-CM | POA: Diagnosis not present

## 2022-04-08 DIAGNOSIS — R0602 Shortness of breath: Secondary | ICD-10-CM

## 2022-04-08 DIAGNOSIS — I5189 Other ill-defined heart diseases: Secondary | ICD-10-CM | POA: Diagnosis not present

## 2022-04-08 DIAGNOSIS — K224 Dyskinesia of esophagus: Secondary | ICD-10-CM

## 2022-04-08 NOTE — Progress Notes (Signed)
Subjective:    Patient ID: Crystal Gregory, female    DOB: 1932-01-03, 88 y.o.   MRN: [id number] Patient Care Team: Pleas Koch, NP as PCP - General (Internal Medicine) Minna Merritts, MD as PCP - Cardiology (Cardiology) Minna Merritts, MD as Consulting Physician (Cardiology) Bo Merino, MD as Consulting Physician (Rheumatology) Tyler Pita, MD as Consulting Physician (Pulmonary Disease)  Chief Complaint  Patient presents with   Follow-up    SOB with exertion and prod cough with green to brown sputum.    PROBLEMS: History of prior ARDS 1999 Prior long term nitrofurantoin therapy Pulmonary fibrosis Bronchiectasis Mild COPD Rheumatoid arthritis (RF/CCP positive) Dysphagia with chronic silent aspiration   PT PROFILE: 88 y.o. with remote minimal smoking history (15 PY, quit 1982) initially evaluated by Dr Stevenson Clinch 03/2016 and subsequently by Dr Chase Caller for pulmonary fibrosis. Pt has prior history of severe ARDS (1999) and had been on chronic nitrofurantoin for recurrent UTIs. She has been tried on 2 maintenance inhalers without discernible benefit.   DATA: CT chest 03/22/16: Generalized pulmonary hyperinflation but without bullous emphysema. Widespread bronchiectasis with bronchial wall thickening consistent with inflammatory bronchitis. Areas of pulmonary fibrosis, most pronounced in the lower lobes, where the bronchiectasis is more extensive, including areas of pulmonary lung destruction HRCT chest 06/11/16: Diffuse cylindrical and varicoid bronchiectasis throughout both lungs, most severe at the lung bases. Basilar predominant fibrotic interstitial lung disease with patchy honeycombing, with mild progression in the short interval since 03/22/2016 Spirometry 06/18/16: Mild obstruction, FEV1 1.42 liters (63%), FVC 2.43 (80%) Bronchoscopy 07/05/16: Normal airway exam. BAL negative for AFB Echocardiogram 10/07/15: LVEF 60%, Grade I DD, mild MR, RVSP estimate 35  mmHg HRCT 11/04/16: again compatible with interstitial lung disease, and considered diagnostic of usual interstitial pneumonia (UIP) from an imaging standpoint. There has been no significant progression of disease compared to the recent prior examination PFT 02/10/17: no obstruction, normal TLC, moderate reduction in DLCO 6MWT 03/11/17: 345 m. No desaturation PFTs 11/22/17: No obstruction.  Lung volumes normal.  DLCO moderately reduced (55% predicted). [place] since 02/10/17 PFTs 11/23/18 : FVC: 2.67 L (95 %pred), FEV1: 2.35 L (112 %pred), FEV1/FVC: 88%, TLC: 4.98 L (88 %pred), DLCO 42 %pred 2D echo 11/16/2019:Left ventricular ejection fraction, by estimation, is 60 to 65%. Left ventricular diastolic parameters were normal.Right ventricular systolic function is normal. The right ventricular size is normal. Mildly elevated pulmonary artery systolic pressure. The estimated right ventricular systolic pressure is 40.1 mmHg. Left atrial size was mildly dilated.Tricuspid valve regurgitation is mild to moderate CT chest 02/15/2020:The appearance of the lungs is compatible with interstitial lung disease, with a spectrum of findings considered diagnostic of usual interstitial pneumonia (UIP) per current ATS guidelines. Minimal progression compared to the prior examination. Barium swallow study 03/05/2020: Tertiary contractions of the esophagus mild spasm, one episode of tracheal aspiration noted without cough reflex, mild GERD. Overnight pulse oximetry 08/18/2020: No evidence of significant nocturnal desaturation. PFTs 08/31/2020: FEV1 2.16 L or 102% predicted, FVC 2.30 L or 80% predicted, FEV1 FVC 94% predicted, no bronchodilator response.  TLC 4.46 L or 78% predicted, DLCO 37% predicted. QuantiFERON gold 03/12/2021: Negative. CT high-res 03/24/2021: Progressively worsening interstitial lung disease likely UIP.  Markedly worsening bronchiectasis in the right lower lobe with acute airspace consolidation.  Pneumonia  considered.  Trace partially loculated right pleural effusion. PFTs 03/25/2021: FEV1 1.38 L or 66% predicted, FVC 1.90 L or 67% predicted FEV1/FVC 73% no bronchodilator response.  Air-trapping noted.  Overall worsening on FEV1  from prior likely related to acute illness diffusion capacity severely reduced. Scleroderma panel 04/01/2021: Negative Chest CT without contrast 05/05/2021: No significant change in pulmonary fibrosis bronchiectasis and areas of honeycombing at the lung bases.  Are consistent with UIP.  The previously noted infiltrate has resolved. PFTs 06/02/2021: FEV1 1.59 L or 76% predicted, FVC 2.08 L or 74% predicted, FEV1/FVC 76%, no bronchodilator response.  Lung volumes normal, improvement from prior PFTs noted.  Diffusion capacity severely reduced however improved from prior. 2D echocardiogram 07/09/2021: LVEF 60 to 65%, grade 2 DD, mildly elevated pulmonary artery systolic pressure, RV systolic pressure 40.9 mmHg mild aortic sclerosis without stenosis.  Stable from prior. Swallow evaluation 07/10/2021: Normal swallow function.  No laryngeal penetration or tracheal aspiration.   INTERVAL: Last encounter was 14 December 2021, at that time she had been diagnosed with presbyesophagus and esophageal dysmotility with reflux.  She is following antireflux measures.  She also had gabapentin adjusted for chronic cough.   HPI Crystal Gregory presents today for a scheduled follow-up of the issues noted above.   She is following antireflux measures carefully. With regards to cough and dyspnea these are at baseline, no worsening.  She does note dyspnea worse in the evenings.  Cough is actually relatively well controlled since she started being more careful with antireflux measures.  Her dyspnea is mostly related to when she "hurries" but not when she is performing activities of daily living and "paces herself".  She is mourning the loss of her husband, [name]. This has "set her back" a bit but mostly emotionally.   She has not had any further issues with thoracolumbar pain.She does not endorse any other new symptomatology.   Review of Systems A 10 point review of systems was performed and it is as noted above otherwise negative.  Patient Active Problem List   Diagnosis Date Noted   Elevated brain natriuretic peptide (BNP) level 01/29/2022   Edema of right lower extremity 81/19/1478   Diastolic dysfunction with acute on chronic heart failure ([place]) 01/29/2022   Baker's cyst, right 01/14/2022   Asymptomatic varicose veins of both lower extremities 01/14/2022   DOE (dyspnea on exertion) 01/14/2022   Compression fracture of thoracic spine, non-traumatic ([place]) 11/26/2021   Tremor of both hands 11/26/2021   B12 deficiency 09/02/2020   Anemia 08/06/2020   TMJ (temporomandibular joint syndrome) 03/02/2018   RA (rheumatoid arthritis) ([place]) 07/13/2017   Polyarthralgia 04/25/2017   ILD (interstitial lung disease) ([place]) 06/18/2016   IPF (idiopathic pulmonary fibrosis) ([place]) 03/23/2016   Bronchiectasis without complication ([place]) 29/56/2130   Allergic rhinitis 03/22/2016   Anal fissure 08/18/2015   Insomnia 08/18/2015   Osteoporosis 02/13/2015   Stress due to illness of family member 09/16/2014   Hiatal hernia 10/13/2012   GERD (gastroesophageal reflux disease)    Allergy    Social History   Tobacco Use   Smoking status: Former    Packs/day: 0.25    Years: 20.00    Total pack years: 5.00    Types: Cigarettes    Quit date: 01/16/1981    Years since quitting: 41.8   Smokeless tobacco: Never  Substance Use Topics   Alcohol use: Yes    Alcohol/week: 7.0 standard drinks of alcohol    Types: 7 Standard drinks or equivalent per week   No Known Allergies  Current Meds  Medication Sig   Albuterol Sulfate, sensor, (PROAIR DIGIHALER) 108 (90 Base) MCG/ACT AEPB Inhale 2 puffs into the lungs every 6 (six) hours. (Patient not taking: Reported  on 07/07/2022)   azaTHIOprine (IMURAN) 50 MG tablet TAKE ONE  AND ONE-HALF TABLETS (75 MG) EVERY MORNING AND 1 TABLET (50 MG) EVERY EVENING   denosumab (PROLIA) 60 MG/ML SOLN injection Inject 60 mg into the skin every 6 (six) months. Administer in upper arm, thigh, or abdomen   gabapentin (NEURONTIN) 100 MG capsule Take 2 capsules (200 mg total) by mouth at bedtime.   Multiple Minerals-Vitamins (CALCIUM & VIT D3 BONE HEALTH PO) Take 1 Dose by mouth daily.   pantoprazole (PROTONIX) 20 MG tablet Take 1 tablet (20 mg total) by mouth 2 (two) times daily. For heartburn   predniSONE (DELTASONE) 1 MG tablet Take 2 tablets (2 mg total) by mouth daily with breakfast.   traZODone (DESYREL) 50 MG tablet Take 1 tablet (50 mg total) by mouth at bedtime as needed for sleep. For sleep.   vitamin B-12 (CYANOCOBALAMIN) 500 MCG tablet Take 500 mcg by mouth daily.   azithromycin (ZITHROMAX) 250 MG tablet Take 1 tablet 3x a week   propranolol (INDERAL) 10 MG tablet TAKE 1 TABLET TWICE A DAY FOR TREMOR   Immunization History  Administered Date(s) Administered   Fluad Quad(high Dose 65+) 07/24/2019   Influenza Split 06/04/2012   Influenza, High Dose Seasonal PF 09/10/2016   Influenza,inj,Quad PF,6+ Mos 07/04/2013, 08/18/2015, 07/13/2017, 06/19/2018   Influenza-Unspecified 07/15/2020   Moderna SARS-COV2 Booster Vaccination 05/19/2020, 02/19/2021   Moderna Sars-Covid-2 Vaccination 10/22/2019, 11/19/2019   Pneumococcal Conjugate-13 10/06/2011   Pneumococcal Polysaccharide-23 06/19/2018   Zoster, Live 01/14/2007       Objective:   Physical Exam BP 112/64 (BP Location: Left Arm, Cuff Size: Normal)   Pulse 83   Temp 97.8 F (36.6 C) (Temporal)   Ht '5\' 8"'$  (1.727 m)   Wt 106 lb 9.6 oz (48.4 kg)   SpO2 97%   BMI 16.21 kg/m  GENERAL: Chronically ill-appearing, thin, well-developed woman in no acute distress.  Fully ambulatory.  She is actually quite spry.  She is chronically pale. HEAD: Normocephalic, atraumatic. EYES: Pupils equal, round, reactive to light.  No scleral  icterus. MOUTH: Dentition intact, oral mucosa moist.  No thrush. NECK: Supple. No thyromegaly. Trachea midline. No JVD.  No adenopathy. PULMONARY: Good air entry bilaterally.  Coarse breath sounds at the bases, otherwise no adventitious sounds.   CARDIOVASCULAR: S1 and S2. Regular rate and rhythm.  No rubs, murmurs or gallops heard.   ABDOMEN: Nondistended, scaphoid.   MUSCULOSKELETAL: No joint deformity, no clubbing, no edema. NEUROLOGIC: No overt focal deficit, no gait disturbance, speech is fluent. SKIN: Intact,warm,dry. PSYCH: Mood and behavior are normal      Assessment & Plan:     ICD-10-CM   1. ILD (interstitial lung disease) ([place])  J84.9    Ill characterized Likely related to past ARDS Aggravated by nitrofurantoin use in past Aggravated by rheumatoid arthritis (possible rheumatoid lung)    2. Shortness of breath  R06.02    Advised to use albuterol in the evenings as needed    3. Bronchiectasis without complication ([place])  Z61.0    No evidence of recent exacerbation    4. Esophageal dysmotility  K22.4    This issue adds complexity to her management Continue antireflux measures Continue instructions as per GI    5. Grade II diastolic dysfunction  R60.45    This issue adds to her sensation of dyspnea Follows with cardiology     Overall [place] appears to be fairly well compensated at present.  We will see her in 3  months time she is to contact us prior to that time should any new difficulties arise.  Renold Don, MD Advanced Bronchoscopy PCCM [facility]    *This note was dictated using voice recognition software/Dragon.  Despite best efforts to proofread, errors can occur which can change the meaning. Any transcriptional errors that result from this process are unintentional and may not be fully corrected at the time of dictation.

## 2022-04-08 NOTE — Patient Instructions (Signed)
Try using your albuterol when you feel short of breath in the evening or if you are cough is an issue.   We will see him in follow-up in 3 months time call sooner should any new problems arise.

## 2022-04-09 DIAGNOSIS — M4854XD Collapsed vertebra, not elsewhere classified, thoracic region, subsequent encounter for fracture with routine healing: Secondary | ICD-10-CM | POA: Diagnosis not present

## 2022-04-09 DIAGNOSIS — M4854XS Collapsed vertebra, not elsewhere classified, thoracic region, sequela of fracture: Secondary | ICD-10-CM | POA: Diagnosis not present

## 2022-04-13 DIAGNOSIS — M4854XS Collapsed vertebra, not elsewhere classified, thoracic region, sequela of fracture: Secondary | ICD-10-CM | POA: Diagnosis not present

## 2022-04-13 DIAGNOSIS — M4854XD Collapsed vertebra, not elsewhere classified, thoracic region, subsequent encounter for fracture with routine healing: Secondary | ICD-10-CM | POA: Diagnosis not present

## 2022-04-15 DIAGNOSIS — M4854XS Collapsed vertebra, not elsewhere classified, thoracic region, sequela of fracture: Secondary | ICD-10-CM | POA: Diagnosis not present

## 2022-04-15 DIAGNOSIS — M4854XD Collapsed vertebra, not elsewhere classified, thoracic region, subsequent encounter for fracture with routine healing: Secondary | ICD-10-CM | POA: Diagnosis not present

## 2022-04-20 DIAGNOSIS — M4854XD Collapsed vertebra, not elsewhere classified, thoracic region, subsequent encounter for fracture with routine healing: Secondary | ICD-10-CM | POA: Diagnosis not present

## 2022-04-20 DIAGNOSIS — M4854XS Collapsed vertebra, not elsewhere classified, thoracic region, sequela of fracture: Secondary | ICD-10-CM | POA: Diagnosis not present

## 2022-04-22 DIAGNOSIS — H35371 Puckering of macula, right eye: Secondary | ICD-10-CM | POA: Diagnosis not present

## 2022-04-23 DIAGNOSIS — M4854XS Collapsed vertebra, not elsewhere classified, thoracic region, sequela of fracture: Secondary | ICD-10-CM | POA: Diagnosis not present

## 2022-04-23 DIAGNOSIS — H903 Sensorineural hearing loss, bilateral: Secondary | ICD-10-CM | POA: Diagnosis not present

## 2022-04-23 DIAGNOSIS — M4854XD Collapsed vertebra, not elsewhere classified, thoracic region, subsequent encounter for fracture with routine healing: Secondary | ICD-10-CM | POA: Diagnosis not present

## 2022-04-26 ENCOUNTER — Ambulatory Visit (INDEPENDENT_AMBULATORY_CARE_PROVIDER_SITE_OTHER): Payer: Medicare Other

## 2022-04-26 ENCOUNTER — Ambulatory Visit: Payer: Self-pay

## 2022-04-26 VITALS — Wt 106.0 lb

## 2022-04-26 DIAGNOSIS — Z0000 Encounter for general adult medical examination without abnormal findings: Secondary | ICD-10-CM

## 2022-04-26 NOTE — Patient Instructions (Signed)
Crystal Gregory , Thank you for taking time to come for your Medicare Wellness Visit. I appreciate your ongoing commitment to your health goals. Please review the following plan we discussed and let me know if I can assist you in the future.   Screening recommendations/referrals: Colonoscopy: aged out Mammogram: aged out Bone Density: aged out Recommended yearly ophthalmology/optometry visit for glaucoma screening and checkup Recommended yearly dental visit for hygiene and checkup  Vaccinations: Influenza vaccine: 07/16/21 Pneumococcal vaccine: 06/19/18 Tdap vaccine: n/d Shingles vaccine: Zostavax 01/14/07   Covid-19:10/22/19, 11/19/19, 05/19/20, 02/19/21  Advanced directives: no  Conditions/risks identified: none  Next appointment: Follow up in one year for your annual wellness visit 04/28/23 @ 8:15 am by phone   Preventive Care 65 Years and Older, Female Preventive care refers to lifestyle choices and visits with your health care provider that can promote health and wellness. What does preventive care include? A yearly physical exam. This is also called an annual well check. Dental exams once or twice a year. Routine eye exams. Ask your health care provider how often you should have your eyes checked. Personal lifestyle choices, including: Daily care of your teeth and gums. Regular physical activity. Eating a healthy diet. Avoiding tobacco and drug use. Limiting alcohol use. Practicing safe sex. Taking low-dose aspirin every day. Taking vitamin and mineral supplements as recommended by your health care provider. What happens during an annual well check? The services and screenings done by your health care provider during your annual well check will depend on your age, overall health, lifestyle risk factors, and family history of disease. Counseling  Your health care provider may ask you questions about your: Alcohol use. Tobacco use. Drug use. Emotional well-being. Home and  relationship well-being. Sexual activity. Eating habits. History of falls. Memory and ability to understand (cognition). Work and work Statistician. Reproductive health. Screening  You may have the following tests or measurements: Height, weight, and BMI. Blood pressure. Lipid and cholesterol levels. These may be checked every 5 years, or more frequently if you are over 44 years old. Skin check. Lung cancer screening. You may have this screening every year starting at age 34 if you have a 30-pack-year history of smoking and currently smoke or have quit within the past 15 years. Fecal occult blood test (FOBT) of the stool. You may have this test every year starting at age 61. Flexible sigmoidoscopy or colonoscopy. You may have a sigmoidoscopy every 5 years or a colonoscopy every 10 years starting at age 83. Hepatitis C blood test. Hepatitis B blood test. Sexually transmitted disease (STD) testing. Diabetes screening. This is done by checking your blood sugar (glucose) after you have not eaten for a while (fasting). You may have this done every 1-3 years. Bone density scan. This is done to screen for osteoporosis. You may have this done starting at age 21. Mammogram. This may be done every 1-2 years. Talk to your health care provider about how often you should have regular mammograms. Talk with your health care provider about your test results, treatment options, and if necessary, the need for more tests. Vaccines  Your health care provider may recommend certain vaccines, such as: Influenza vaccine. This is recommended every year. Tetanus, diphtheria, and acellular pertussis (Tdap, Td) vaccine. You may need a Td booster every 10 years. Zoster vaccine. You may need this after age 92. Pneumococcal 13-valent conjugate (PCV13) vaccine. One dose is recommended after age 37. Pneumococcal polysaccharide (PPSV23) vaccine. One dose is recommended after age 67.  Talk to your health care provider  about which screenings and vaccines you need and how often you need them. This information is not intended to replace advice given to you by your health care provider. Make sure you discuss any questions you have with your health care provider. Document Released: 10/17/2015 Document Revised: 06/09/2016 Document Reviewed: 07/22/2015 Elsevier Interactive Patient Education  2017 [place] Prevention in the Home Falls can cause injuries. They can happen to people of all ages. There are many things you can do to make your home safe and to help prevent falls. What can I do on the outside of my home? Regularly fix the edges of walkways and driveways and fix any cracks. Remove anything that might make you trip as you walk through a door, such as a raised step or threshold. Trim any bushes or trees on the path to your home. Use bright outdoor lighting. Clear any walking paths of anything that might make someone trip, such as rocks or tools. Regularly check to see if handrails are loose or broken. Make sure that both sides of any steps have handrails. Any raised decks and porches should have guardrails on the edges. Have any leaves, snow, or ice cleared regularly. Use sand or salt on walking paths during winter. Clean up any spills in your garage right away. This includes oil or grease spills. What can I do in the bathroom? Use night lights. Install grab bars by the toilet and in the tub and shower. Do not use towel bars as grab bars. Use non-skid mats or decals in the tub or shower. If you need to sit down in the shower, use a plastic, non-slip stool. Keep the floor dry. Clean up any water that spills on the floor as soon as it happens. Remove soap buildup in the tub or shower regularly. Attach bath mats securely with double-sided non-slip rug tape. Do not have throw rugs and other things on the floor that can make you trip. What can I do in the bedroom? Use night lights. Make sure  that you have a light by your bed that is easy to reach. Do not use any sheets or blankets that are too big for your bed. They should not hang down onto the floor. Have a firm chair that has side arms. You can use this for support while you get dressed. Do not have throw rugs and other things on the floor that can make you trip. What can I do in the kitchen? Clean up any spills right away. Avoid walking on wet floors. Keep items that you use a lot in easy-to-reach places. If you need to reach something above you, use a strong step stool that has a grab bar. Keep electrical cords out of the way. Do not use floor polish or wax that makes floors slippery. If you must use wax, use non-skid floor wax. Do not have throw rugs and other things on the floor that can make you trip. What can I do with my stairs? Do not leave any items on the stairs. Make sure that there are handrails on both sides of the stairs and use them. Fix handrails that are broken or loose. Make sure that handrails are as long as the stairways. Check any carpeting to make sure that it is firmly attached to the stairs. Fix any carpet that is loose or worn. Avoid having throw rugs at the top or bottom of the stairs. If you do have throw  rugs, attach them to the floor with carpet tape. Make sure that you have a light switch at the top of the stairs and the bottom of the stairs. If you do not have them, ask someone to add them for you. What else can I do to help prevent falls? Wear shoes that: Do not have high heels. Have rubber bottoms. Are comfortable and fit you well. Are closed at the toe. Do not wear sandals. If you use a stepladder: Make sure that it is fully opened. Do not climb a closed stepladder. Make sure that both sides of the stepladder are locked into place. Ask someone to hold it for you, if possible. Clearly mark and make sure that you can see: Any grab bars or handrails. First and last steps. Where the edge of  each step is. Use tools that help you move around (mobility aids) if they are needed. These include: Canes. Walkers. Scooters. Crutches. Turn on the lights when you go into a dark area. Replace any light bulbs as soon as they burn out. Set up your furniture so you have a clear path. Avoid moving your furniture around. If any of your floors are uneven, fix them. If there are any pets around you, be aware of where they are. Review your medicines with your doctor. Some medicines can make you feel dizzy. This can increase your chance of falling. Ask your doctor what other things that you can do to help prevent falls. This information is not intended to replace advice given to you by your health care provider. Make sure you discuss any questions you have with your health care provider. Document Released: 07/17/2009 Document Revised: 02/26/2016 Document Reviewed: 10/25/2014 Elsevier Interactive Patient Education  2017 [employer]. 2

## 2022-04-26 NOTE — Progress Notes (Addendum)
Virtual Visit via Telephone Note  I connected with  Crystal Gregory on 04/26/22 at  8:15 AM EDT by telephone and verified that I am speaking with the correct person using two identifiers.  Location: Patient: home Provider: [place] Persons participating in the virtual visit: [place]   I discussed the limitations, risks, security and privacy concerns of performing an evaluation and management service by telephone and the availability of in person appointments. The patient expressed understanding and agreed to proceed.  Interactive audio and video telecommunications were attempted between this nurse and patient, however failed, due to patient having technical difficulties OR patient did not have access to video capability.  We continued and completed visit with audio only.  Some vital signs may be absent or patient reported.   Dionisio David, LPN  Subjective:   Crystal Gregory is a 88 y.o. female who presents for Medicare Annual (Subsequent) preventive examination.  Review of Systems     Cardiac Risk Factors include: advanced age (>24mn, >>65women)     Objective:    There were no vitals filed for this visit. There is no height or weight on file to calculate BMI.     04/26/2022    8:23 AM 01/04/2022   10:05 AM 07/02/2021   10:16 AM 12/30/2020    9:42 AM 08/11/2020   11:05 AM 08/25/2018    5:55 PM 08/24/2017    1:00 AM  Advanced Directives  Does Patient Have a Medical Advance Directive? No No Yes Yes No No Yes  Type of AComptrollerLiving will HPardees[place]Living will   HA[place] will  Does patient want to make changes to medical advance directive?   No - Patient declined No - Patient declined   No - Patient declined  Copy of HGeneseein Chart?   No - copy requested No - copy requested   No - copy requested  Would patient like information on creating a  medical advance directive? No - Patient declined No - Patient declined    No - Patient declined     Current Medications (verified) Outpatient Encounter Medications as of 04/26/2022  Medication Sig   Albuterol Sulfate, sensor, (PROAIR DIGIHALER) 108 (90 Base) MCG/ACT AEPB Inhale 2 puffs into the lungs every 6 (six) hours.   azaTHIOprine (IMURAN) 50 MG tablet TAKE ONE AND ONE-HALF TABLETS (75 MG) EVERY MORNING AND 1 TABLET (50 MG) EVERY EVENING   azithromycin (ZITHROMAX) 250 MG tablet Take 1 tablet 3x a week   denosumab (PROLIA) 60 MG/ML SOLN injection Inject 60 mg into the skin every 6 (six) months. Administer in upper arm, thigh, or abdomen   furosemide (LASIX) 20 MG tablet    Multiple Minerals-Vitamins (CALCIUM & VIT D3 BONE HEALTH PO) Take 1 Dose by mouth daily.   pantoprazole (PROTONIX) 20 MG tablet Take 1 tablet (20 mg total) by mouth 2 (two) times daily. For heartburn   predniSONE (DELTASONE) 1 MG tablet Take 2 tablets (2 mg total) by mouth daily with breakfast.   propranolol (INDERAL) 10 MG tablet TAKE 1 TABLET TWICE A DAY FOR TREMOR   traZODone (DESYREL) 50 MG tablet Take 1 tablet (50 mg total) by mouth at bedtime as needed for sleep. For sleep.   vitamin B-12 (CYANOCOBALAMIN) 500 MCG tablet Take 500 mcg by mouth daily.   gabapentin (NEURONTIN) 100 MG capsule Take 2 capsules (200 mg total) by mouth at bedtime.  montelukast (SINGULAIR) 10 MG tablet Take 1 tablet (10 mg total) by mouth daily.   torsemide (DEMADEX) 10 MG tablet Take 1 tablet (10 mg total) by mouth daily. (Patient not taking: Reported on 04/08/2022)   No facility-administered encounter medications on file as of 04/26/2022.    Allergies (verified) Patient has no known allergies.   History: Past Medical History:  Diagnosis Date   Allergy    Anal fissure    Aspiration into airway    CAP (community acquired pneumonia) 08/23/2017   Chest pain    a. 09/04/2018 MV: EF >65%. No ischemia/infarct.   Coronary artery  calcification seen on CT scan    Diastolic dysfunction    a. 10/2016 Echo: EF 60-65%, no rwma, Gr1 DD, mild MR, nl RV fxn, mild to mod TR, PASP 41mHg.   Diverticulosis    Dyspnea    GERD (gastroesophageal reflux disease)    Hemorrhoids    Hyperplastic colon polyp    Interstitial lung disorders ([place]    Pulmonary fibrosis ([place]    Septicemia ([place] 1999   spent 4 weeks in ICU, intubated -? PNA   Vertebral fracture, pathological    Past Surgical History:  Procedure Laterality Date   ABDOMINAL HYSTERECTOMY     FLEXIBLE BRONCHOSCOPY N/A 07/05/2016   Procedure: FLEXIBLE BRONCHOSCOPY;  Surgeon: VVilinda Boehringer MD;  Location: [facility] ORS;  Service: Cardiopulmonary;  Laterality: N/A;   IR RADIOLOGIST EVAL & MGMT  11/05/2021   Family History  Problem Relation Age of Onset   Parkinson's disease Mother    Colon polyps Mother [id number]          Ulcers Father    Rheumatic fever Father    Macular degeneration Sister    Healthy Son    Healthy Daughter    Social History   Socioeconomic History   Marital status: Married    Spouse name: Not on file   Number of children: 2   Years of education: Not on file   Highest education level: Not on file  Occupational History   Occupation: Retired   Tobacco Use   Smoking status: Former    Packs/day: 0.25    Years: 20.00    Total pack years: 5.00    Types: Cigarettes    Quit date: 01/16/1981    Years since quitting: 41.3   Smokeless tobacco: Never  Vaping Use   Vaping Use: Never used  Substance and Sexual Activity   Alcohol use: Yes    Alcohol/week: 7.0 standard drinks of alcohol    Types: 7 Standard drinks or equivalent per week   Drug use: No   Sexual activity: Not on file  Other Topics Concern   Not on file  Social History Narrative   Married.     2 children.  [place]   Social Determinants of Health   Financial Resource Strain: Low Risk  (04/26/2022)   Overall Financial Resource Strain (CARDIA)    Difficulty of  Paying Living Expenses: Not hard at all  Food Insecurity: No Food Insecurity (04/26/2022)   Hunger Vital Sign    Worried About Running Out of Food in the Last Year: Never true    Ran Out of Food in the Last Year: Never true  Transportation Needs: No Transportation Needs (04/26/2022)   PRAPARE - THydrologist(Medical): No    Lack of Transportation (Non-Medical): No  Physical Activity: Insufficiently Active (04/26/2022)   Exercise Vital Sign  Days of Exercise per Week: 2 days    Minutes of Exercise per Session: 30 min  Stress: No Stress Concern Present (04/26/2022)   [place]    Feeling of Stress : Not at all  Social Connections: Moderately Isolated (04/26/2022)   Social Connection and Isolation Panel [NHANES]    Frequency of Communication with Friends and Family: More than three times a week    Frequency of Social Gatherings with Friends and Family: Twice a week    Attends Religious Services: More than 4 times per year    Active Member of [employer] or Organizations: No    Attends Archivist Meetings: Never    Marital Status: Widowed    Tobacco Counseling Counseling given: Not Answered   Clinical Intake:  Pre-visit preparation completed: Yes  Pain : No/denies pain     Nutritional Risks: None Diabetes: No  How often do you need to have someone help you when you read instructions, pamphlets, or other written materials from your doctor or pharmacy?: 1 - Never  Diabetic?no  Interpreter Needed?: No  Information entered by :: Kirke Shaggy, LPN   Activities of Daily Living    04/26/2022    8:24 AM  In your present state of health, do you have any difficulty performing the following activities:  Hearing? 1  Vision? 0  Difficulty concentrating or making decisions? 0  Walking or climbing stairs? 0  Dressing or bathing? 0  Doing errands, shopping? 0  Preparing Food and  eating ? N  Using the Toilet? N  In the past six months, have you accidently leaked urine? N  Do you have problems with loss of bowel control? N  Managing your Medications? N  Managing your Finances? N  Housekeeping or managing your Housekeeping? N    Patient Care Team: Pleas Koch, NP as PCP - General (Internal Medicine) Rockey Situ Kathlene November, MD as PCP - Cardiology (Cardiology) Minna Merritts, MD as Consulting Physician (Cardiology) Bo Merino, MD as Consulting Physician (Rheumatology)  Indicate any recent Medical Services you may have received from other than [facility] providers in the past year (date may be approximate).     Assessment:   This is a routine wellness examination for Ang[place].  Hearing/Vision screen Hearing Screening - Comments:: Wears aids Vision Screening - Comments:: Wears glasses- [facility]  Dietary issues and exercise activities discussed: Current Exercise Habits: Home exercise routine, Type of exercise: walking, Time (Minutes): 30, Frequency (Times/Week): 3, Weekly Exercise (Minutes/Week): 90, Intensity: Mild   Goals Addressed             This Visit's Progress    DIET - EAT MORE FRUITS AND VEGETABLES         Depression Screen    04/26/2022    8:20 AM 01/21/2021    9:22 AM 11/17/2016    3:12 PM 08/10/2016   12:11 PM  PHQ 2/9 Scores  PHQ - 2 Score 0 0 0 1  PHQ- 9 Score 0 0 1 5    Fall Risk    04/26/2022    8:24 AM 01/21/2021    9:22 AM 08/10/2016   12:11 PM  Fall Risk   Falls in the past year? 0 0 No  Number falls in past yr: 0 0   Injury with Fall? 0 0   Risk for fall due to : No Fall Risks    Follow up Falls evaluation completed  FALL RISK PREVENTION PERTAINING TO THE HOME:  Any stairs in or around the home? No  If so, are there any without handrails? Yes  Home free of loose throw rugs in walkways, pet beds, electrical cords, etc? Yes  Adequate lighting in your home to reduce risk of falls? Yes   ASSISTIVE DEVICES  UTILIZED TO PREVENT FALLS:  Life alert? Yes  Use of a cane, walker or w/c? No  Grab bars in the bathroom? Yes  Shower chair or bench in shower? No  Elevated toilet seat or a handicapped toilet? Yes   Cognitive Function:        04/26/2022    8:26 AM  6CIT Screen  What Year? 0 points  What month? 0 points  What time? 0 points  Count back from 20 0 points  Months in reverse 0 points  Repeat phrase 2 points  Total Score 2 points    Immunizations Immunization History  Administered Date(s) Administered   Fluad Quad(high Dose 65+) 07/24/2019   Influenza Split 06/04/2012   Influenza, High Dose Seasonal PF 09/10/2016   Influenza,inj,Quad PF,6+ Mos 07/04/2013, 08/18/2015, 07/13/2017, 06/19/2018   Influenza-Unspecified 07/15/2020   Moderna SARS-COV2 Booster Vaccination 05/19/2020, 02/19/2021   Moderna Sars-Covid-2 Vaccination 10/22/2019, 11/19/2019   Pneumococcal Conjugate-13 10/06/2011   Pneumococcal Polysaccharide-23 06/19/2018   Zoster, Live 01/14/2007    TDAP status: Due, Education has been provided regarding the importance of this vaccine. Advised may receive this vaccine at local pharmacy or Health Dept. Aware to provide a copy of the vaccination record if obtained from local pharmacy or Health Dept. Verbalized acceptance and understanding.  Flu Vaccine status: Up to date  Pneumococcal vaccine status: Up to date  Covid-19 vaccine status: Completed vaccines  Qualifies for Shingles Vaccine? Yes   Zostavax completed Yes   Shingrix Completed?: No.    Education has been provided regarding the importance of this vaccine. Patient has been advised to call insurance company to determine out of pocket expense if they have not yet received this vaccine. Advised may also receive vaccine at local pharmacy or Health Dept. Verbalized acceptance and understanding.  Screening Tests Health Maintenance  Topic Date Due   TETANUS/TDAP  Never done   Zoster Vaccines- Shingrix (1 of 2) Never  done   COVID-19 Vaccine (3 - Moderna risk series) 03/19/2021   INFLUENZA VACCINE  05/04/2022   Pneumonia Vaccine 25+ Years old  Completed   DEXA SCAN  Completed   HPV VACCINES  Aged Out    Health Maintenance  Health Maintenance Due  Topic Date Due   TETANUS/TDAP  Never done   Zoster Vaccines- Shingrix (1 of 2) Never done   COVID-19 Vaccine (3 - Moderna risk series) 03/19/2021    Colorectal cancer screening: No longer required.   Mammogram status: No longer required due to age.  Lung Cancer Screening: (Low Dose CT Chest recommended if Age 85-80 years, 30 pack-year currently smoking OR have quit w/in 15years.) does not qualify.    Additional Screening:  Hepatitis C Screening: does not qualify; Completed no  Vision Screening: Recommended annual ophthalmology exams for early detection of glaucoma and other disorders of the eye. Is the patient up to date with their annual eye exam?  Yes  Who is the provider or what is the name of the office in which the patient attends annual eye exams? [facility] If pt is not established with a provider, would they like to be referred to a provider to establish care? No .  Dental Screening: Recommended annual dental exams for proper oral hygiene  Community Resource Referral / Chronic Care Management: CRR required this visit?  No   CCM required this visit?  No      Plan:     I have personally reviewed and noted the following in the patient's chart:   Medical and social history Use of alcohol, tobacco or illicit drugs  Current medications and supplements including opioid prescriptions.  Functional ability and status Nutritional status Physical activity Advanced directives List of other physicians Hospitalizations, surgeries, and ER visits in previous 12 months Vitals Screenings to include cognitive, depression, and falls Referrals and appointments  In addition, I have reviewed and discussed with patient certain preventive  protocols, quality metrics, and best practice recommendations. A written personalized care plan for preventive services as well as general preventive health recommendations were provided to patient.     Dionisio David, LPN   [id number]   Nurse Notes: none

## 2022-04-27 ENCOUNTER — Telehealth: Payer: Self-pay

## 2022-04-27 DIAGNOSIS — M4854XD Collapsed vertebra, not elsewhere classified, thoracic region, subsequent encounter for fracture with routine healing: Secondary | ICD-10-CM | POA: Diagnosis not present

## 2022-04-27 DIAGNOSIS — M4854XS Collapsed vertebra, not elsewhere classified, thoracic region, sequela of fracture: Secondary | ICD-10-CM | POA: Diagnosis not present

## 2022-04-27 NOTE — Telephone Encounter (Signed)
Benefits submitted-pending Next injection due 05/27/22 or after

## 2022-04-29 DIAGNOSIS — M4854XS Collapsed vertebra, not elsewhere classified, thoracic region, sequela of fracture: Secondary | ICD-10-CM | POA: Diagnosis not present

## 2022-04-29 DIAGNOSIS — M4854XD Collapsed vertebra, not elsewhere classified, thoracic region, subsequent encounter for fracture with routine healing: Secondary | ICD-10-CM | POA: Diagnosis not present

## 2022-05-04 DIAGNOSIS — M4854XD Collapsed vertebra, not elsewhere classified, thoracic region, subsequent encounter for fracture with routine healing: Secondary | ICD-10-CM | POA: Diagnosis not present

## 2022-05-04 DIAGNOSIS — M4854XS Collapsed vertebra, not elsewhere classified, thoracic region, sequela of fracture: Secondary | ICD-10-CM | POA: Diagnosis not present

## 2022-05-06 DIAGNOSIS — M4854XD Collapsed vertebra, not elsewhere classified, thoracic region, subsequent encounter for fracture with routine healing: Secondary | ICD-10-CM | POA: Diagnosis not present

## 2022-05-06 DIAGNOSIS — M4854XS Collapsed vertebra, not elsewhere classified, thoracic region, sequela of fracture: Secondary | ICD-10-CM | POA: Diagnosis not present

## 2022-05-11 DIAGNOSIS — M4854XD Collapsed vertebra, not elsewhere classified, thoracic region, subsequent encounter for fracture with routine healing: Secondary | ICD-10-CM | POA: Diagnosis not present

## 2022-05-11 DIAGNOSIS — M4854XS Collapsed vertebra, not elsewhere classified, thoracic region, sequela of fracture: Secondary | ICD-10-CM | POA: Diagnosis not present

## 2022-05-12 NOTE — Telephone Encounter (Signed)
OOP cost is $0 Lab is going to be done on 05/18/22 by rheumatologist, will see if they can do BMP. NV on 05/27/22

## 2022-05-13 DIAGNOSIS — M4854XD Collapsed vertebra, not elsewhere classified, thoracic region, subsequent encounter for fracture with routine healing: Secondary | ICD-10-CM | POA: Diagnosis not present

## 2022-05-13 DIAGNOSIS — M4854XS Collapsed vertebra, not elsewhere classified, thoracic region, sequela of fracture: Secondary | ICD-10-CM | POA: Diagnosis not present

## 2022-05-18 DIAGNOSIS — J849 Interstitial pulmonary disease, unspecified: Secondary | ICD-10-CM | POA: Diagnosis not present

## 2022-05-18 DIAGNOSIS — M4854XS Collapsed vertebra, not elsewhere classified, thoracic region, sequela of fracture: Secondary | ICD-10-CM | POA: Diagnosis not present

## 2022-05-18 DIAGNOSIS — M059 Rheumatoid arthritis with rheumatoid factor, unspecified: Secondary | ICD-10-CM | POA: Diagnosis not present

## 2022-05-18 DIAGNOSIS — Z79899 Other long term (current) drug therapy: Secondary | ICD-10-CM | POA: Diagnosis not present

## 2022-05-18 DIAGNOSIS — M4854XD Collapsed vertebra, not elsewhere classified, thoracic region, subsequent encounter for fracture with routine healing: Secondary | ICD-10-CM | POA: Diagnosis not present

## 2022-05-19 NOTE — Telephone Encounter (Signed)
Labs done by rheumatology on 05/18/22-Calcium normal at 9.1. CrCl is 49.21 mL/min

## 2022-05-20 ENCOUNTER — Telehealth: Payer: Self-pay | Admitting: Primary Care

## 2022-05-20 DIAGNOSIS — M4854XD Collapsed vertebra, not elsewhere classified, thoracic region, subsequent encounter for fracture with routine healing: Secondary | ICD-10-CM | POA: Diagnosis not present

## 2022-05-20 DIAGNOSIS — M4854XS Collapsed vertebra, not elsewhere classified, thoracic region, sequela of fracture: Secondary | ICD-10-CM | POA: Diagnosis not present

## 2022-05-20 NOTE — Telephone Encounter (Signed)
Patient advised labs from rheumatologist office can be used from 05/18/22. Ok to proceed with prolia injections.

## 2022-05-20 NOTE — Telephone Encounter (Signed)
Patient called and stated that she wants to know about the prolia injection. Call back number [phone].

## 2022-05-25 DIAGNOSIS — M4854XD Collapsed vertebra, not elsewhere classified, thoracic region, subsequent encounter for fracture with routine healing: Secondary | ICD-10-CM | POA: Diagnosis not present

## 2022-05-25 DIAGNOSIS — M4854XS Collapsed vertebra, not elsewhere classified, thoracic region, sequela of fracture: Secondary | ICD-10-CM | POA: Diagnosis not present

## 2022-05-27 ENCOUNTER — Ambulatory Visit (INDEPENDENT_AMBULATORY_CARE_PROVIDER_SITE_OTHER): Payer: Medicare Other

## 2022-05-27 DIAGNOSIS — M8000XS Age-related osteoporosis with current pathological fracture, unspecified site, sequela: Secondary | ICD-10-CM

## 2022-05-27 DIAGNOSIS — M4854XD Collapsed vertebra, not elsewhere classified, thoracic region, subsequent encounter for fracture with routine healing: Secondary | ICD-10-CM | POA: Diagnosis not present

## 2022-05-27 DIAGNOSIS — M4854XS Collapsed vertebra, not elsewhere classified, thoracic region, sequela of fracture: Secondary | ICD-10-CM | POA: Diagnosis not present

## 2022-05-27 MED ORDER — DENOSUMAB 60 MG/ML ~~LOC~~ SOSY
60.0000 mg | PREFILLED_SYRINGE | Freq: Once | SUBCUTANEOUS | Status: AC
  Administered 2022-05-27: 60 mg via SUBCUTANEOUS

## 2022-05-27 NOTE — Progress Notes (Signed)
Per orders of Katherine Clark,NP, injection of Prolia given by Shaunta Oncale. Patient tolerated injection well.  

## 2022-05-27 NOTE — Telephone Encounter (Signed)
Pt received Prolia inj today.  Fyi to Anastasiya.

## 2022-05-28 DIAGNOSIS — M8000XS Age-related osteoporosis with current pathological fracture, unspecified site, sequela: Secondary | ICD-10-CM

## 2022-05-28 DIAGNOSIS — M2550 Pain in unspecified joint: Secondary | ICD-10-CM

## 2022-05-28 DIAGNOSIS — M069 Rheumatoid arthritis, unspecified: Secondary | ICD-10-CM

## 2022-06-01 DIAGNOSIS — M4854XD Collapsed vertebra, not elsewhere classified, thoracic region, subsequent encounter for fracture with routine healing: Secondary | ICD-10-CM | POA: Diagnosis not present

## 2022-06-01 DIAGNOSIS — M4854XS Collapsed vertebra, not elsewhere classified, thoracic region, sequela of fracture: Secondary | ICD-10-CM | POA: Diagnosis not present

## 2022-06-03 DIAGNOSIS — M4854XS Collapsed vertebra, not elsewhere classified, thoracic region, sequela of fracture: Secondary | ICD-10-CM | POA: Diagnosis not present

## 2022-06-03 DIAGNOSIS — M4854XD Collapsed vertebra, not elsewhere classified, thoracic region, subsequent encounter for fracture with routine healing: Secondary | ICD-10-CM | POA: Diagnosis not present

## 2022-06-08 DIAGNOSIS — M4854XS Collapsed vertebra, not elsewhere classified, thoracic region, sequela of fracture: Secondary | ICD-10-CM | POA: Diagnosis not present

## 2022-06-08 DIAGNOSIS — M4854XD Collapsed vertebra, not elsewhere classified, thoracic region, subsequent encounter for fracture with routine healing: Secondary | ICD-10-CM | POA: Diagnosis not present

## 2022-06-10 DIAGNOSIS — M4854XS Collapsed vertebra, not elsewhere classified, thoracic region, sequela of fracture: Secondary | ICD-10-CM | POA: Diagnosis not present

## 2022-06-10 DIAGNOSIS — M4854XD Collapsed vertebra, not elsewhere classified, thoracic region, subsequent encounter for fracture with routine healing: Secondary | ICD-10-CM | POA: Diagnosis not present

## 2022-06-21 IMAGING — RF DG SWALLOWING FUNCTION
7 series · 19 of 24 positions shown · non-contrast
Comparison: Esophagram 03/05/2020

CLINICAL DATA: Dysphagia.

EXAM:
MODIFIED BARIUM SWALLOW
TECHNIQUE: Different consistencies of barium were administered orally to the
patient by the Speech Pathologist. Imaging of the pharynx was
performed in the lateral projection. The radiologist was present in
the fluoroscopy room for this study, providing personal supervision.
FLUOROSCOPY TIME:  Fluoroscopy Time:  1 minutes 42 seconds
Radiation Exposure Index (if provided by the fluoroscopic device):
7.1 mGy
Number of Acquired Spot Images: None.

[Series 1: cp_standard · 0.09mm/px · 2 of 156 frames shown (1 of 7)]
[frame 24/156]
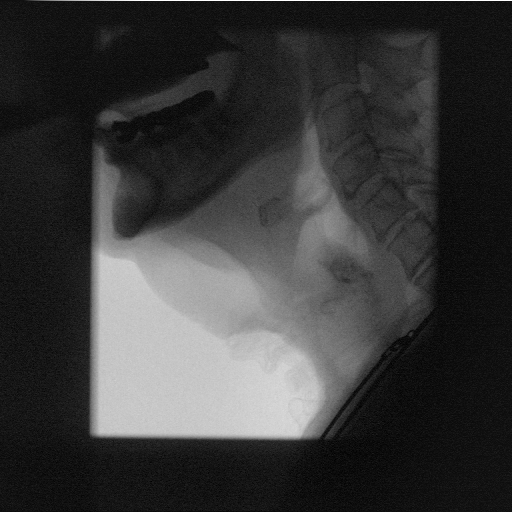
[frame 55/156]
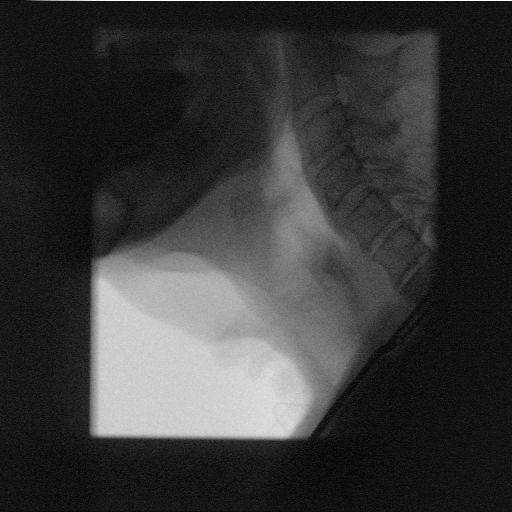

[Series 2: cp_standard · 0.09mm/px · 4 of 151 frames shown (2 of 7)]
[frame 7/151]
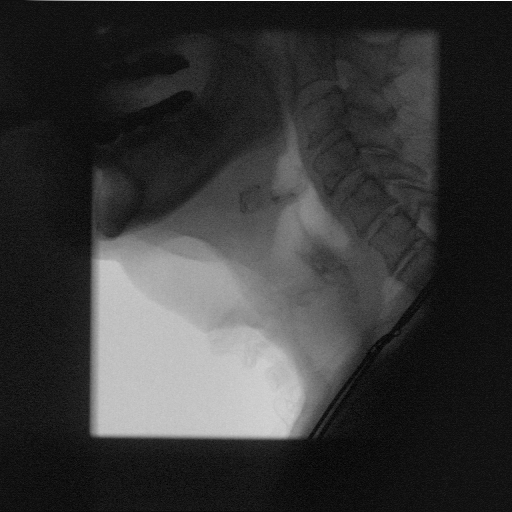
[frame 23/151]
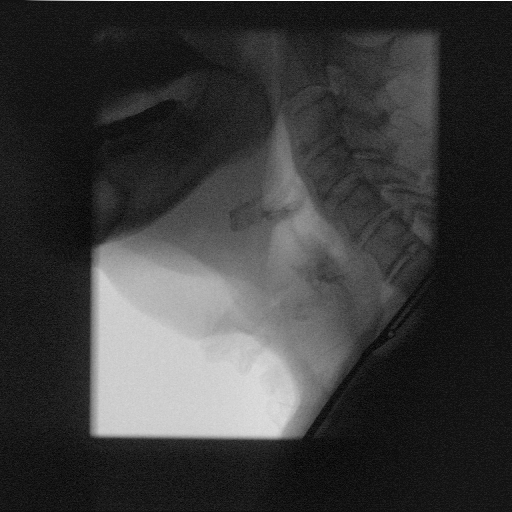
[frame 76/151]
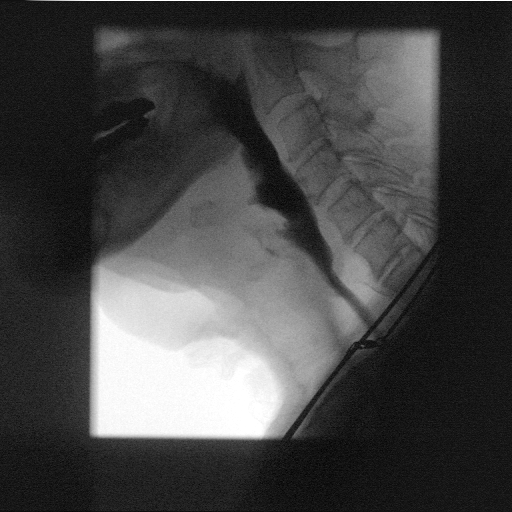
[frame 129/151]
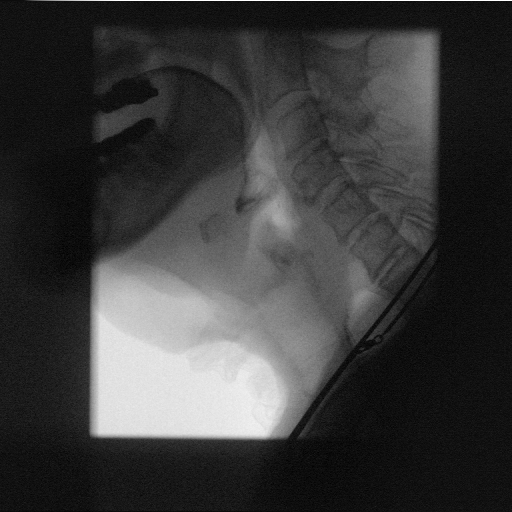

[Series 3: cp_standard · 0.09mm/px · 2 of 137 frames shown (3 of 7)]
[frame 21/137]
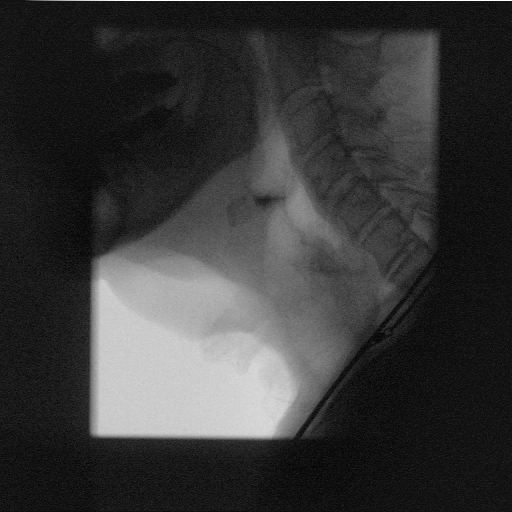
[frame 117/137]
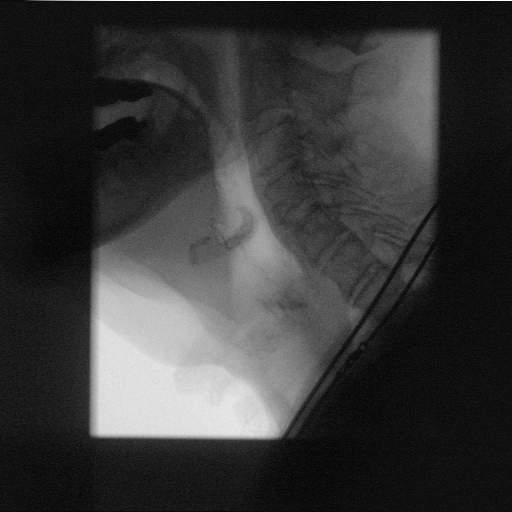

[Series 4: cp_standard · 0.09mm/px · 3 of 530 frames shown (4 of 7)]
[frame 80/530]
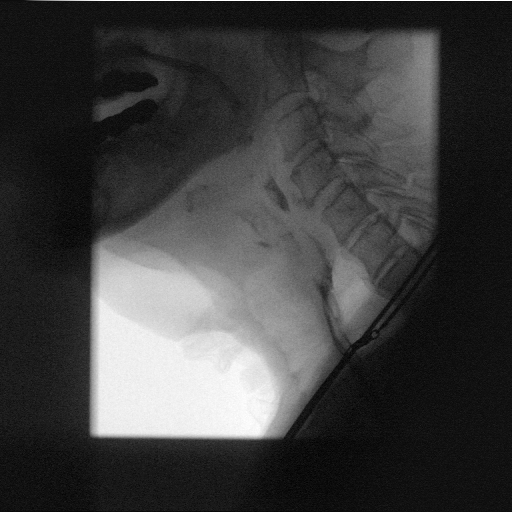
[frame 358/530]
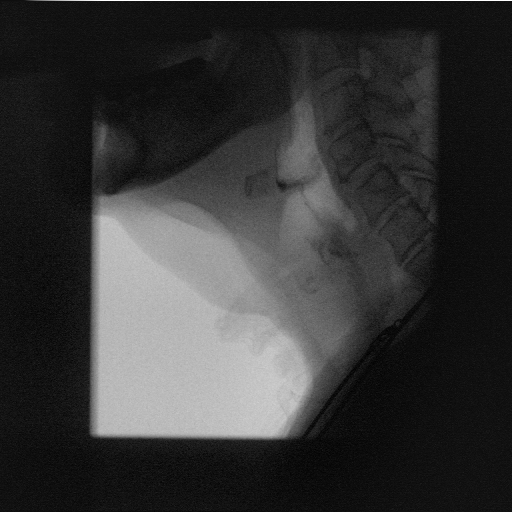
[frame 451/530]
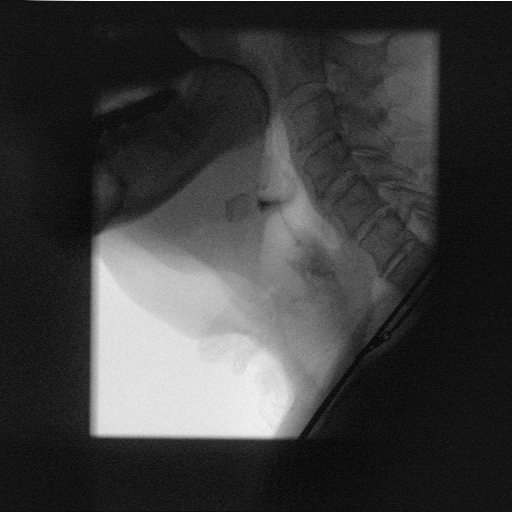

[Series 5: cp_standard · 0.09mm/px · 2 of 507 frames shown (5 of 7)]
[frame 23/507]
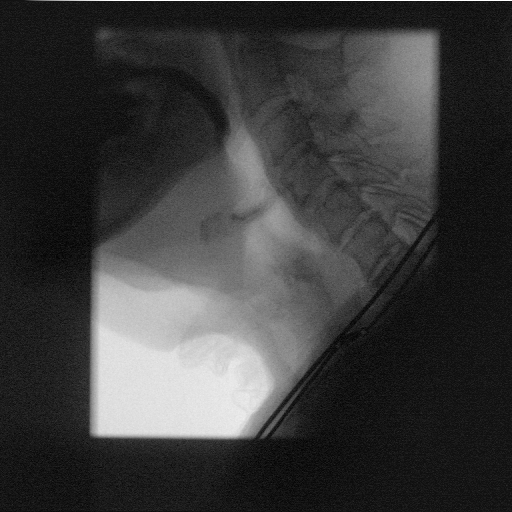
[frame 254/507]
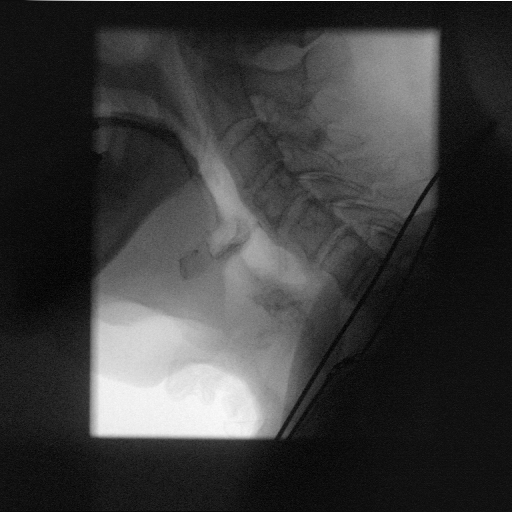

[Series 6: cp_standard · 0.09mm/px · 4 of 579 frames shown (6 of 7)]
[frame 87/579]
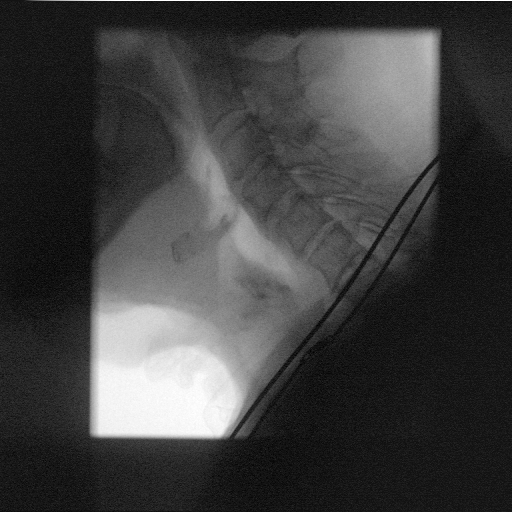
[frame 155/579]
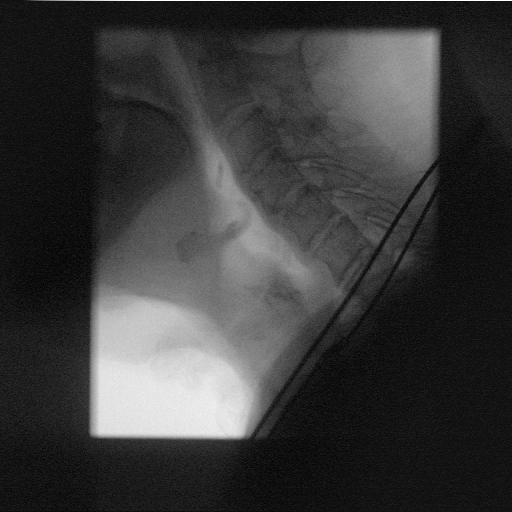
[frame 290/579]
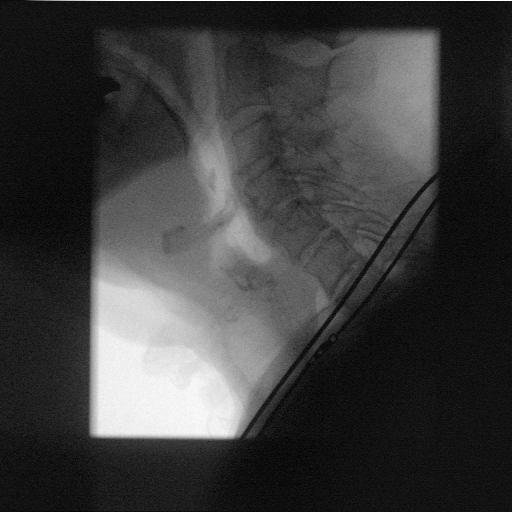
[frame 493/579]
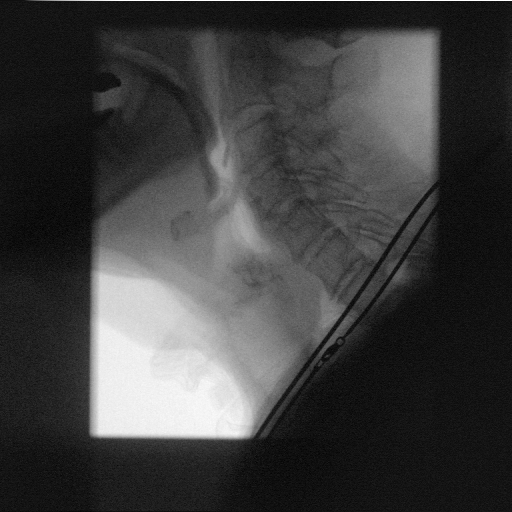

[Series 7: cp_standard · 0.09mm/px · 2 of 432 frames shown (7 of 7)]
[frame 264/432]
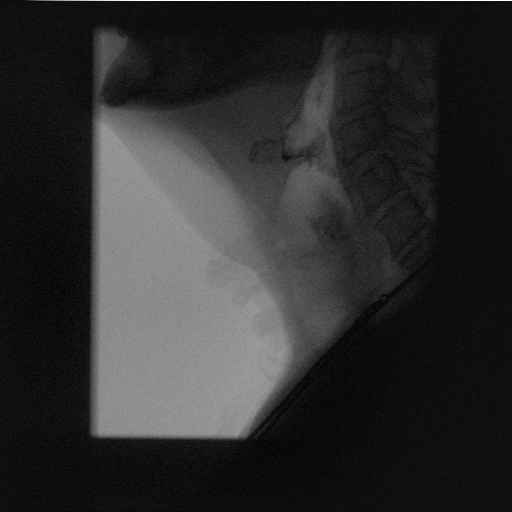
[frame 368/432]
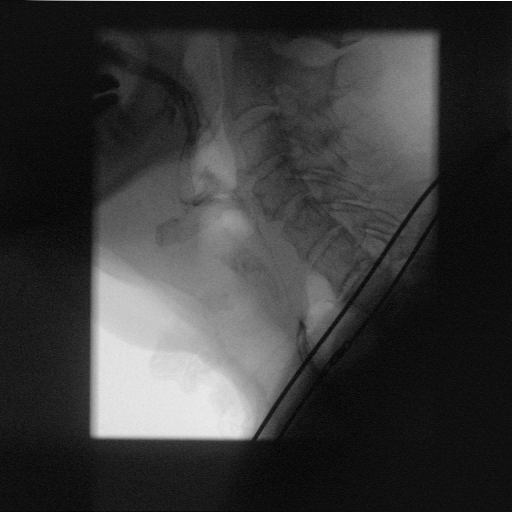

[19 of 24 positions shown; findings below may reference images not displayed]

FINDINGS: Real-time fluoroscopy of the swallowing function was performed with
a speech pathologist present.

Multiple consistencies of barium were administered which included
water, nectar, applesauce, and Charmayne cracker.

Normal swallow function. No laryngeal penetration or tracheal
aspiration.
IMPRESSION: No aspiration or penetration identified

Please refer to the Speech Pathologists report for complete details
and recommendations.

## 2022-06-22 DIAGNOSIS — M4854XS Collapsed vertebra, not elsewhere classified, thoracic region, sequela of fracture: Secondary | ICD-10-CM | POA: Diagnosis not present

## 2022-06-22 DIAGNOSIS — M4854XD Collapsed vertebra, not elsewhere classified, thoracic region, subsequent encounter for fracture with routine healing: Secondary | ICD-10-CM | POA: Diagnosis not present

## 2022-06-24 DIAGNOSIS — M4854XD Collapsed vertebra, not elsewhere classified, thoracic region, subsequent encounter for fracture with routine healing: Secondary | ICD-10-CM | POA: Diagnosis not present

## 2022-06-24 DIAGNOSIS — M4854XS Collapsed vertebra, not elsewhere classified, thoracic region, sequela of fracture: Secondary | ICD-10-CM | POA: Diagnosis not present

## 2022-06-29 DIAGNOSIS — M4854XS Collapsed vertebra, not elsewhere classified, thoracic region, sequela of fracture: Secondary | ICD-10-CM | POA: Diagnosis not present

## 2022-06-29 DIAGNOSIS — M4854XD Collapsed vertebra, not elsewhere classified, thoracic region, subsequent encounter for fracture with routine healing: Secondary | ICD-10-CM | POA: Diagnosis not present

## 2022-07-01 DIAGNOSIS — M4854XS Collapsed vertebra, not elsewhere classified, thoracic region, sequela of fracture: Secondary | ICD-10-CM | POA: Diagnosis not present

## 2022-07-01 DIAGNOSIS — M4854XD Collapsed vertebra, not elsewhere classified, thoracic region, subsequent encounter for fracture with routine healing: Secondary | ICD-10-CM | POA: Diagnosis not present

## 2022-07-06 ENCOUNTER — Inpatient Hospital Stay: Payer: Medicare Other | Attending: Oncology

## 2022-07-06 DIAGNOSIS — Z87891 Personal history of nicotine dependence: Secondary | ICD-10-CM | POA: Diagnosis not present

## 2022-07-06 DIAGNOSIS — Z83719 Family history of colon polyps, unspecified: Secondary | ICD-10-CM | POA: Diagnosis not present

## 2022-07-06 DIAGNOSIS — Z83518 Family history of other specified eye disorder: Secondary | ICD-10-CM | POA: Diagnosis not present

## 2022-07-06 DIAGNOSIS — E538 Deficiency of other specified B group vitamins: Secondary | ICD-10-CM | POA: Insufficient documentation

## 2022-07-06 DIAGNOSIS — Z818 Family history of other mental and behavioral disorders: Secondary | ICD-10-CM | POA: Insufficient documentation

## 2022-07-06 DIAGNOSIS — Z79899 Other long term (current) drug therapy: Secondary | ICD-10-CM | POA: Diagnosis not present

## 2022-07-06 DIAGNOSIS — D638 Anemia in other chronic diseases classified elsewhere: Secondary | ICD-10-CM | POA: Diagnosis not present

## 2022-07-06 DIAGNOSIS — R609 Edema, unspecified: Secondary | ICD-10-CM | POA: Insufficient documentation

## 2022-07-06 DIAGNOSIS — Z7289 Other problems related to lifestyle: Secondary | ICD-10-CM | POA: Insufficient documentation

## 2022-07-06 DIAGNOSIS — M069 Rheumatoid arthritis, unspecified: Secondary | ICD-10-CM | POA: Diagnosis not present

## 2022-07-06 DIAGNOSIS — R5383 Other fatigue: Secondary | ICD-10-CM | POA: Insufficient documentation

## 2022-07-06 DIAGNOSIS — M4854XD Collapsed vertebra, not elsewhere classified, thoracic region, subsequent encounter for fracture with routine healing: Secondary | ICD-10-CM | POA: Diagnosis not present

## 2022-07-06 DIAGNOSIS — M4854XS Collapsed vertebra, not elsewhere classified, thoracic region, sequela of fracture: Secondary | ICD-10-CM | POA: Diagnosis not present

## 2022-07-06 LAB — CBC WITH DIFFERENTIAL/PLATELET
Abs Immature Granulocytes: 0.05 10*3/uL (ref 0.00–0.07)
Basophils Absolute: 0 10*3/uL (ref 0.0–0.1)
Basophils Relative: 1 %
Eosinophils Absolute: 0 10*3/uL (ref 0.0–0.5)
Eosinophils Relative: 0 %
HCT: 29.6 % — ABNORMAL LOW (ref 36.0–46.0)
Hemoglobin: 9.8 g/dL — ABNORMAL LOW (ref 12.0–15.0)
Immature Granulocytes: 1 %
Lymphocytes Relative: 20 %
Lymphs Abs: 1.3 10*3/uL (ref 0.7–4.0)
MCH: 36.7 pg — ABNORMAL HIGH (ref 26.0–34.0)
MCHC: 33.1 g/dL (ref 30.0–36.0)
MCV: 110.9 fL — ABNORMAL HIGH (ref 80.0–100.0)
Monocytes Absolute: 0.3 10*3/uL (ref 0.1–1.0)
Monocytes Relative: 5 %
Neutro Abs: 4.9 10*3/uL (ref 1.7–7.7)
Neutrophils Relative %: 73 %
Platelets: 339 10*3/uL (ref 150–400)
RBC: 2.67 MIL/uL — ABNORMAL LOW (ref 3.87–5.11)
RDW: 15.6 % — ABNORMAL HIGH (ref 11.5–15.5)
WBC: 6.7 10*3/uL (ref 4.0–10.5)
nRBC: 0 % (ref 0.0–0.2)

## 2022-07-06 LAB — VITAMIN B12: Vitamin B-12: 758 pg/mL (ref 180–914)

## 2022-07-06 LAB — IRON AND TIBC
Iron: 88 ug/dL (ref 28–170)
Saturation Ratios: 28 % (ref 10.4–31.8)
TIBC: 309 ug/dL (ref 250–450)
UIBC: 221 ug/dL

## 2022-07-06 LAB — FERRITIN: Ferritin: 27 ng/mL (ref 11–307)

## 2022-07-07 ENCOUNTER — Encounter: Payer: Self-pay | Admitting: Pulmonary Disease

## 2022-07-07 ENCOUNTER — Ambulatory Visit (INDEPENDENT_AMBULATORY_CARE_PROVIDER_SITE_OTHER): Payer: Medicare Other | Admitting: Pulmonary Disease

## 2022-07-07 VITALS — BP 118/78 | HR 70 | Temp 97.7°F | Ht 68.0 in | Wt 106.2 lb

## 2022-07-07 DIAGNOSIS — J849 Interstitial pulmonary disease, unspecified: Secondary | ICD-10-CM | POA: Diagnosis not present

## 2022-07-07 DIAGNOSIS — M0579 Rheumatoid arthritis with rheumatoid factor of multiple sites without organ or systems involvement: Secondary | ICD-10-CM | POA: Diagnosis not present

## 2022-07-07 DIAGNOSIS — K224 Dyskinesia of esophagus: Secondary | ICD-10-CM

## 2022-07-07 DIAGNOSIS — F4321 Adjustment disorder with depressed mood: Secondary | ICD-10-CM

## 2022-07-07 DIAGNOSIS — J479 Bronchiectasis, uncomplicated: Secondary | ICD-10-CM | POA: Diagnosis not present

## 2022-07-07 DIAGNOSIS — R0602 Shortness of breath: Secondary | ICD-10-CM | POA: Diagnosis not present

## 2022-07-07 NOTE — Progress Notes (Signed)
Subjective:    Patient ID: Crystal Gregory, female    DOB: 23-Sep-1932, 88 y.o.   MRN: [id number] Patient Care Team: Pleas Koch, NP as PCP - General (Internal Medicine) Minna Merritts, MD as PCP - Cardiology (Cardiology) Minna Merritts, MD as Consulting Physician (Cardiology) Bo Merino, MD as Consulting Physician (Rheumatology) Tyler Pita, MD as Consulting Physician (Pulmonary Disease)  Chief Complaint  Patient presents with   Follow-up    ILD (interstitial lung disease). SOB with exertion. Coughing at night with brown and green sputum.   PROBLEMS: History of prior ARDS 1999 Prior long term nitrofurantoin therapy Pulmonary fibrosis Bronchiectasis Mild COPD Rheumatoid arthritis (RF/CCP positive) Dysphagia with chronic silent aspiration   PT PROFILE: 88 y.o. with remote minimal smoking history (15 PY, quit 1982) initially evaluated by Dr Stevenson Clinch 03/2016 and subsequently by Dr Chase Caller for pulmonary fibrosis. Pt has prior history of severe ARDS (1999) and had been on chronic nitrofurantoin for recurrent UTIs. She has been tried on 2 maintenance inhalers without discernible benefit.   DATA: CT chest 03/22/16: Generalized pulmonary hyperinflation but without bullous emphysema. Widespread bronchiectasis with bronchial wall thickening consistent with inflammatory bronchitis. Areas of pulmonary fibrosis, most pronounced in the lower lobes, where the bronchiectasis is more extensive, including areas of pulmonary lung destruction HRCT chest 06/11/16: Diffuse cylindrical and varicoid bronchiectasis throughout both lungs, most severe at the lung bases. Basilar predominant fibrotic interstitial lung disease with patchy honeycombing, with mild progression in the short interval since 03/22/2016 Spirometry 06/18/16: Mild obstruction, FEV1 1.42 liters (63%), FVC 2.43 (80%) Bronchoscopy 07/05/16: Normal airway exam. BAL negative for AFB Echocardiogram 10/07/15: LVEF 60%,  Grade I DD, mild MR, RVSP estimate 35 mmHg HRCT 11/04/16: again compatible with interstitial lung disease, and considered diagnostic of usual interstitial pneumonia (UIP) from an imaging standpoint. There has been no significant progression of disease compared to the recent prior examination PFT 02/10/17: no obstruction, normal TLC, moderate reduction in DLCO 6MWT 03/11/17: 345 m. No desaturation PFTs 11/22/17: No obstruction.  Lung volumes normal.  DLCO moderately reduced (55% predicted). [place] since 02/10/17 PFTs 11/23/18 : FVC: 2.67 L (95 %pred), FEV1: 2.35 L (112 %pred), FEV1/FVC: 88%, TLC: 4.98 L (88 %pred), DLCO 42 %pred 2D echo 11/16/2019:Left ventricular ejection fraction, by estimation, is 60 to 65%. Left ventricular diastolic parameters were normal.Right ventricular systolic function is normal. The right ventricular size is normal. Mildly elevated pulmonary artery systolic pressure. The estimated right ventricular systolic pressure is 64.3 mmHg. Left atrial size was mildly dilated.Tricuspid valve regurgitation is mild to moderate CT chest 02/15/2020:The appearance of the lungs is compatible with interstitial lung disease, with a spectrum of findings considered diagnostic of usual interstitial pneumonia (UIP) per current ATS guidelines. Minimal progression compared to the prior examination. Barium swallow study 03/05/2020: Tertiary contractions of the esophagus mild spasm, one episode of tracheal aspiration noted without cough reflex, mild GERD. Overnight pulse oximetry 08/18/2020: No evidence of significant nocturnal desaturation. PFTs 08/31/2020: FEV1 2.16 L or 102% predicted, FVC 2.30 L or 80% predicted, FEV1 FVC 94% predicted, no bronchodilator response.  TLC 4.46 L or 78% predicted, DLCO 37% predicted. QuantiFERON gold 03/12/2021: Negative. CT high-res 03/24/2021: Progressively worsening interstitial lung disease likely UIP.  Markedly worsening bronchiectasis in the right lower lobe with  acute airspace consolidation.  Pneumonia considered.  Trace partially loculated right pleural effusion. PFTs 03/25/2021: FEV1 1.38 L or 66% predicted, FVC 1.90 L or 67% predicted FEV1/FVC 73% no bronchodilator response.  Air-trapping noted.  Overall  worsening on FEV1 from prior likely related to acute illness diffusion capacity severely reduced. Scleroderma panel 04/01/2021: Negative Chest CT without contrast 05/05/2021: No significant change in pulmonary fibrosis bronchiectasis and areas of honeycombing at the lung bases.  Are consistent with UIP.  The previously noted infiltrate has resolved. PFTs 06/02/2021: FEV1 1.59 L or 76% predicted, FVC 2.08 L or 74% predicted, FEV1/FVC 76%, no bronchodilator response.  Lung volumes normal, improvement from prior PFTs noted.  Diffusion capacity severely reduced however improved from prior. 2D echocardiogram 07/09/2021: LVEF 60 to 65%, grade 2 DD, mildly elevated pulmonary artery systolic pressure, RV systolic pressure 40.9 mmHg mild aortic sclerosis without stenosis.  Stable from prior. Swallow evaluation 07/10/2021: Normal swallow function.  No laryngeal penetration or tracheal aspiration.   INTERVAL: Last encounter was 08 April 2022, at that time she was doing relatively well and was observing antireflux measures without any further episode of aspiration.  She will complain of some mild shortness of breath and cough in the evenings.  Advised to use albuterol as needed.  HPI Crystal Gregory presents today for a scheduled follow-up of the issues noted above.   She is following antireflux measures carefully. With regards to cough and dyspnea these are at baseline, no worsening.  She does note dyspnea worse in the evenings.  Cough is actually relatively well controlled since she started being more careful with antireflux measures.  Her dyspnea is mostly related to when she "hurries" but not when she is performing activities of daily living and "paces herself".  She continues  to mourn the loss of her husband, Public house manager. This has "set her back" a bit but mostly emotionally.  She does have support from her daughter who lives in [place] and visits her frequently.  She does note fatigue but is currently being treated for anemia and I suspect it is related to this.  She has not had any further issues with thoracolumbar pain. She does not endorse any other new symptomatology.    Review of Systems A 10 point review of systems was performed and it is as noted above otherwise negative.  Patient Active Problem List   Diagnosis Date Noted   Elevated brain natriuretic peptide (BNP) level 01/29/2022   Edema of right lower extremity 01/29/2022   Diastolic dysfunction with acute on chronic heart failure (HCC) 01/29/2022   Baker's cyst, right 01/14/2022   Asymptomatic varicose veins of both lower extremities 01/14/2022   DOE (dyspnea on exertion) 01/14/2022   Compression fracture of thoracic spine, non-traumatic (HCC) 11/26/2021   Tremor of both hands 11/26/2021   B12 deficiency 09/02/2020   Anemia 08/06/2020   TMJ (temporomandibular joint syndrome) 03/02/2018   RA (rheumatoid arthritis) (HCC) 07/13/2017   Polyarthralgia 04/25/2017   ILD (interstitial lung disease) (HCC) 06/18/2016   IPF (idiopathic pulmonary fibrosis) (HCC) 03/23/2016   Bronchiectasis without complication (HCC) 03/23/2016   Allergic rhinitis 03/22/2016   Anal fissure 08/18/2015   Insomnia 08/18/2015   Osteoporosis 02/13/2015   Stress due to illness of family member 09/16/2014   Hiatal hernia 10/13/2012   GERD (gastroesophageal reflux disease)    Allergy    Social History   Tobacco Use   Smoking status: Former    Packs/day: 0.25    Years: 20.00    Total pack years: 5.00    Types: Cigarettes    Quit date: 01/16/1981    Years since quitting: 41.4   Smokeless tobacco: Never  Substance Use Topics   Alcohol use: Yes  Alcohol/week: 7.0 standard drinks of alcohol    Types: 7 Standard drinks or  equivalent per week  ' No Known Allergies  Current Meds  Medication Sig   azaTHIOprine (IMURAN) 50 MG tablet TAKE ONE AND ONE-HALF TABLETS (75 MG) EVERY MORNING AND 1 TABLET (50 MG) EVERY EVENING   azithromycin (ZITHROMAX) 250 MG tablet Take 1 tablet 3x a week   denosumab (PROLIA) 60 MG/ML SOLN injection Inject 60 mg into the skin every 6 (six) months. Administer in upper arm, thigh, or abdomen   gabapentin (NEURONTIN) 100 MG capsule Take 2 capsules (200 mg total) by mouth at bedtime.   Multiple Minerals-Vitamins (CALCIUM & VIT D3 BONE HEALTH PO) Take 1 Dose by mouth daily.   pantoprazole (PROTONIX) 20 MG tablet Take 1 tablet (20 mg total) by mouth 2 (two) times daily. For heartburn   predniSONE (DELTASONE) 1 MG tablet Take 2 tablets (2 mg total) by mouth daily with breakfast.   propranolol (INDERAL) 10 MG tablet TAKE 1 TABLET TWICE A DAY FOR TREMOR   traZODone (DESYREL) 50 MG tablet Take 1 tablet (50 mg total) by mouth at bedtime as needed for sleep. For sleep.   vitamin B-12 (CYANOCOBALAMIN) 500 MCG tablet Take 500 mcg by mouth daily.   Immunization History  Administered Date(s) Administered   Fluad Quad(high Dose 65+) 07/24/2019   Influenza Split 06/04/2012   Influenza, High Dose Seasonal PF 09/10/2016   Influenza,inj,Quad PF,6+ Mos 07/04/2013, 08/18/2015, 07/13/2017, 06/19/2018   Influenza-Unspecified 07/15/2020   Moderna SARS-COV2 Booster Vaccination 05/19/2020, 02/19/2021   Moderna Sars-Covid-2 Vaccination 10/22/2019, 11/19/2019   Pneumococcal Conjugate-13 10/06/2011   Pneumococcal Polysaccharide-23 06/19/2018   Zoster, Live 01/14/2007       Objective:   Physical Exam BP 118/78 (BP Location: Left Arm, Cuff Size: Normal)   Pulse 70   Temp 97.7 F (36.5 C)   Wt 106 lb 3.2 oz (48.2 kg)   SpO2 98%   BMI 16.15 kg/m  GENERAL: Chronically ill-appearing, thin well-developed woman in no acute distress.  Fully ambulatory.  She is actually quite spry.  She is chronically  pale. HEAD: Normocephalic, atraumatic. EYES: Pupils equal, round, reactive to light.  No scleral icterus. MOUTH: Nose/mouth/throat not examined due to masking requirements for COVID 19. NECK: Supple. No thyromegaly. Trachea midline. No JVD.  No adenopathy. PULMONARY: Good air entry bilaterally.  Coarse breath sounds at the bases, otherwise no adventitious sounds.  CARDIOVASCULAR: S1 and S2. Regular rate and rhythm.  No rubs, murmurs or gallops heard.   ABDOMEN: Nondistended, scaphoid.   MUSCULOSKELETAL: No joint deformity, no clubbing, no edema. NEUROLOGIC: No overt focal deficit, no gait disturbance, speech is fluent. SKIN: Intact,warm,dry. PSYCH: Mood and behavior are normal      Assessment & Plan:     ICD-10-CM   1. ILD (interstitial lung disease) (HCC)  J84.9    Multifactorial Appears compensated    2. Shortness of breath  R06.02    Recommended overnight oximetry Patient declined Multifactorial    3. Bronchiectasis without complication (HCC)  J47.9    Continue pulmonary hygiene    4. Esophageal dysmotility  K22.4    This issue adds complexity to her management Patient observing antireflux measures    5. Rheumatoid arthritis involving multiple sites with positive rheumatoid factor (HCC)  M05.79    Continue follow-up with rheumatology This issue adds complexity to her management    6. Situational depression  F43.21    Prolonged bereavement Advised to discuss with primary care  From the interstitial lung disease standpoint Crystal Gregory appears to be doing as well as expected.  Her disease process is multifactorial and related to the multiple issues noted above.  She has had issues with shortness of breath and fatigue that I suspect are more related to her current issues with anemia that are being addressed by hematology.  She declined overnight oximetry today.  She continues to be very affected by the passing of her husband, Public house manager.  I have asked her to discuss this with her  primary care provider as she may need a mild antidepressant to help her through this.  She does have good support from her daughter who does visit her often from [facility].  See Margee in follow-up in 4 months time she is to contact us prior to that time should any new difficulties arise.  Gailen Shelter, MD Advanced Bronchoscopy PCCM [facility]    *This note was dictated using voice recognition software/Dragon.  Despite best efforts to proofread, errors can occur which can change the meaning. Any transcriptional errors that result from this process are unintentional and may not be fully corrected at the time of dictation.

## 2022-07-07 NOTE — Patient Instructions (Signed)
I suspect your fatigue is related to your anemia.  Talk to your primary provider with regards to issues with potential depression.  Your lungs sounded good today.  We will see you in follow-up in 4 months time call sooner should any new problems arise.

## 2022-07-08 DIAGNOSIS — M4854XS Collapsed vertebra, not elsewhere classified, thoracic region, sequela of fracture: Secondary | ICD-10-CM | POA: Diagnosis not present

## 2022-07-08 DIAGNOSIS — M4854XD Collapsed vertebra, not elsewhere classified, thoracic region, subsequent encounter for fracture with routine healing: Secondary | ICD-10-CM | POA: Diagnosis not present

## 2022-07-13 DIAGNOSIS — M4854XD Collapsed vertebra, not elsewhere classified, thoracic region, subsequent encounter for fracture with routine healing: Secondary | ICD-10-CM | POA: Diagnosis not present

## 2022-07-13 DIAGNOSIS — M4854XS Collapsed vertebra, not elsewhere classified, thoracic region, sequela of fracture: Secondary | ICD-10-CM | POA: Diagnosis not present

## 2022-07-15 DIAGNOSIS — M4854XS Collapsed vertebra, not elsewhere classified, thoracic region, sequela of fracture: Secondary | ICD-10-CM | POA: Diagnosis not present

## 2022-07-15 DIAGNOSIS — M4854XD Collapsed vertebra, not elsewhere classified, thoracic region, subsequent encounter for fracture with routine healing: Secondary | ICD-10-CM | POA: Diagnosis not present

## 2022-07-20 DIAGNOSIS — M4854XD Collapsed vertebra, not elsewhere classified, thoracic region, subsequent encounter for fracture with routine healing: Secondary | ICD-10-CM | POA: Diagnosis not present

## 2022-07-20 DIAGNOSIS — M4854XS Collapsed vertebra, not elsewhere classified, thoracic region, sequela of fracture: Secondary | ICD-10-CM | POA: Diagnosis not present

## 2022-07-22 DIAGNOSIS — M4854XD Collapsed vertebra, not elsewhere classified, thoracic region, subsequent encounter for fracture with routine healing: Secondary | ICD-10-CM | POA: Diagnosis not present

## 2022-07-22 DIAGNOSIS — M4854XS Collapsed vertebra, not elsewhere classified, thoracic region, sequela of fracture: Secondary | ICD-10-CM | POA: Diagnosis not present

## 2022-07-27 DIAGNOSIS — M4854XS Collapsed vertebra, not elsewhere classified, thoracic region, sequela of fracture: Secondary | ICD-10-CM | POA: Diagnosis not present

## 2022-07-27 DIAGNOSIS — M4854XD Collapsed vertebra, not elsewhere classified, thoracic region, subsequent encounter for fracture with routine healing: Secondary | ICD-10-CM | POA: Diagnosis not present

## 2022-07-29 DIAGNOSIS — M4854XD Collapsed vertebra, not elsewhere classified, thoracic region, subsequent encounter for fracture with routine healing: Secondary | ICD-10-CM | POA: Diagnosis not present

## 2022-07-29 DIAGNOSIS — M4854XS Collapsed vertebra, not elsewhere classified, thoracic region, sequela of fracture: Secondary | ICD-10-CM | POA: Diagnosis not present

## 2022-08-02 ENCOUNTER — Encounter (INDEPENDENT_AMBULATORY_CARE_PROVIDER_SITE_OTHER): Payer: Self-pay

## 2022-08-03 DIAGNOSIS — M4854XS Collapsed vertebra, not elsewhere classified, thoracic region, sequela of fracture: Secondary | ICD-10-CM | POA: Diagnosis not present

## 2022-08-03 DIAGNOSIS — M4854XD Collapsed vertebra, not elsewhere classified, thoracic region, subsequent encounter for fracture with routine healing: Secondary | ICD-10-CM | POA: Diagnosis not present

## 2022-08-05 DIAGNOSIS — M4854XD Collapsed vertebra, not elsewhere classified, thoracic region, subsequent encounter for fracture with routine healing: Secondary | ICD-10-CM | POA: Diagnosis not present

## 2022-08-05 DIAGNOSIS — M4854XS Collapsed vertebra, not elsewhere classified, thoracic region, sequela of fracture: Secondary | ICD-10-CM | POA: Diagnosis not present

## 2022-08-06 NOTE — Telephone Encounter (Signed)
Patient was seen 05/18/22 by [facility]  Pt was made aware via Mychart Msg and Letter in June where her referral was sent and who to contact if she did not hear from them.    Referral Notes. Type Date User Summary Attachment  General 04/01/2022 11:31 AM Virl Cagey, CMA - -  Note: Referral and OV notes faxed via ROI within referral. Sent to [facility] - Dr Jefm Bryant.    Mychart letter sent to patient making aware of where to call to schedule appt   Mychart letter sent to patient making aware of where referral/order was sent and where to call to schedule their appt.

## 2022-08-10 DIAGNOSIS — M4854XS Collapsed vertebra, not elsewhere classified, thoracic region, sequela of fracture: Secondary | ICD-10-CM | POA: Diagnosis not present

## 2022-08-10 DIAGNOSIS — D0471 Carcinoma in situ of skin of right lower limb, including hip: Secondary | ICD-10-CM | POA: Diagnosis not present

## 2022-08-10 DIAGNOSIS — D2272 Melanocytic nevi of left lower limb, including hip: Secondary | ICD-10-CM | POA: Diagnosis not present

## 2022-08-10 DIAGNOSIS — D2271 Melanocytic nevi of right lower limb, including hip: Secondary | ICD-10-CM | POA: Diagnosis not present

## 2022-08-10 DIAGNOSIS — D225 Melanocytic nevi of trunk: Secondary | ICD-10-CM | POA: Diagnosis not present

## 2022-08-10 DIAGNOSIS — D2261 Melanocytic nevi of right upper limb, including shoulder: Secondary | ICD-10-CM | POA: Diagnosis not present

## 2022-08-10 DIAGNOSIS — M4854XD Collapsed vertebra, not elsewhere classified, thoracic region, subsequent encounter for fracture with routine healing: Secondary | ICD-10-CM | POA: Diagnosis not present

## 2022-08-10 DIAGNOSIS — D485 Neoplasm of uncertain behavior of skin: Secondary | ICD-10-CM | POA: Diagnosis not present

## 2022-08-10 DIAGNOSIS — D2262 Melanocytic nevi of left upper limb, including shoulder: Secondary | ICD-10-CM | POA: Diagnosis not present

## 2022-08-12 DIAGNOSIS — M4854XS Collapsed vertebra, not elsewhere classified, thoracic region, sequela of fracture: Secondary | ICD-10-CM | POA: Diagnosis not present

## 2022-08-12 DIAGNOSIS — M4854XD Collapsed vertebra, not elsewhere classified, thoracic region, subsequent encounter for fracture with routine healing: Secondary | ICD-10-CM | POA: Diagnosis not present

## 2022-08-17 DIAGNOSIS — M4854XD Collapsed vertebra, not elsewhere classified, thoracic region, subsequent encounter for fracture with routine healing: Secondary | ICD-10-CM | POA: Diagnosis not present

## 2022-08-17 DIAGNOSIS — M4854XS Collapsed vertebra, not elsewhere classified, thoracic region, sequela of fracture: Secondary | ICD-10-CM | POA: Diagnosis not present

## 2022-08-19 DIAGNOSIS — M4854XS Collapsed vertebra, not elsewhere classified, thoracic region, sequela of fracture: Secondary | ICD-10-CM | POA: Diagnosis not present

## 2022-08-19 DIAGNOSIS — M4854XD Collapsed vertebra, not elsewhere classified, thoracic region, subsequent encounter for fracture with routine healing: Secondary | ICD-10-CM | POA: Diagnosis not present

## 2022-08-25 DIAGNOSIS — M4854XD Collapsed vertebra, not elsewhere classified, thoracic region, subsequent encounter for fracture with routine healing: Secondary | ICD-10-CM | POA: Diagnosis not present

## 2022-08-25 DIAGNOSIS — M4854XS Collapsed vertebra, not elsewhere classified, thoracic region, sequela of fracture: Secondary | ICD-10-CM | POA: Diagnosis not present

## 2022-08-27 DIAGNOSIS — M4854XD Collapsed vertebra, not elsewhere classified, thoracic region, subsequent encounter for fracture with routine healing: Secondary | ICD-10-CM | POA: Diagnosis not present

## 2022-08-27 DIAGNOSIS — M4854XS Collapsed vertebra, not elsewhere classified, thoracic region, sequela of fracture: Secondary | ICD-10-CM | POA: Diagnosis not present

## 2022-08-31 DIAGNOSIS — M4854XD Collapsed vertebra, not elsewhere classified, thoracic region, subsequent encounter for fracture with routine healing: Secondary | ICD-10-CM | POA: Diagnosis not present

## 2022-08-31 DIAGNOSIS — M4854XS Collapsed vertebra, not elsewhere classified, thoracic region, sequela of fracture: Secondary | ICD-10-CM | POA: Diagnosis not present

## 2022-09-02 DIAGNOSIS — M4854XS Collapsed vertebra, not elsewhere classified, thoracic region, sequela of fracture: Secondary | ICD-10-CM | POA: Diagnosis not present

## 2022-09-02 DIAGNOSIS — M4854XD Collapsed vertebra, not elsewhere classified, thoracic region, subsequent encounter for fracture with routine healing: Secondary | ICD-10-CM | POA: Diagnosis not present

## 2022-09-07 DIAGNOSIS — M4854XS Collapsed vertebra, not elsewhere classified, thoracic region, sequela of fracture: Secondary | ICD-10-CM | POA: Diagnosis not present

## 2022-09-07 DIAGNOSIS — M4854XD Collapsed vertebra, not elsewhere classified, thoracic region, subsequent encounter for fracture with routine healing: Secondary | ICD-10-CM | POA: Diagnosis not present

## 2022-09-09 DIAGNOSIS — M4854XD Collapsed vertebra, not elsewhere classified, thoracic region, subsequent encounter for fracture with routine healing: Secondary | ICD-10-CM | POA: Diagnosis not present

## 2022-09-09 DIAGNOSIS — M4854XS Collapsed vertebra, not elsewhere classified, thoracic region, sequela of fracture: Secondary | ICD-10-CM | POA: Diagnosis not present

## 2022-09-14 DIAGNOSIS — M4854XD Collapsed vertebra, not elsewhere classified, thoracic region, subsequent encounter for fracture with routine healing: Secondary | ICD-10-CM | POA: Diagnosis not present

## 2022-09-14 DIAGNOSIS — M4854XS Collapsed vertebra, not elsewhere classified, thoracic region, sequela of fracture: Secondary | ICD-10-CM | POA: Diagnosis not present

## 2022-09-17 DIAGNOSIS — M4854XS Collapsed vertebra, not elsewhere classified, thoracic region, sequela of fracture: Secondary | ICD-10-CM | POA: Diagnosis not present

## 2022-09-17 DIAGNOSIS — M4854XD Collapsed vertebra, not elsewhere classified, thoracic region, subsequent encounter for fracture with routine healing: Secondary | ICD-10-CM | POA: Diagnosis not present

## 2022-09-20 DIAGNOSIS — J849 Interstitial pulmonary disease, unspecified: Secondary | ICD-10-CM | POA: Diagnosis not present

## 2022-09-20 DIAGNOSIS — M0579 Rheumatoid arthritis with rheumatoid factor of multiple sites without organ or systems involvement: Secondary | ICD-10-CM | POA: Diagnosis not present

## 2022-09-20 DIAGNOSIS — Z79899 Other long term (current) drug therapy: Secondary | ICD-10-CM | POA: Diagnosis not present

## 2022-09-20 DIAGNOSIS — M810 Age-related osteoporosis without current pathological fracture: Secondary | ICD-10-CM | POA: Diagnosis not present

## 2022-09-21 DIAGNOSIS — D0471 Carcinoma in situ of skin of right lower limb, including hip: Secondary | ICD-10-CM | POA: Diagnosis not present

## 2022-09-21 DIAGNOSIS — M4854XD Collapsed vertebra, not elsewhere classified, thoracic region, subsequent encounter for fracture with routine healing: Secondary | ICD-10-CM | POA: Diagnosis not present

## 2022-09-21 DIAGNOSIS — M4854XS Collapsed vertebra, not elsewhere classified, thoracic region, sequela of fracture: Secondary | ICD-10-CM | POA: Diagnosis not present

## 2022-09-23 DIAGNOSIS — M4854XS Collapsed vertebra, not elsewhere classified, thoracic region, sequela of fracture: Secondary | ICD-10-CM | POA: Diagnosis not present

## 2022-09-23 DIAGNOSIS — M4854XD Collapsed vertebra, not elsewhere classified, thoracic region, subsequent encounter for fracture with routine healing: Secondary | ICD-10-CM | POA: Diagnosis not present

## 2022-09-29 ENCOUNTER — Encounter: Payer: Self-pay | Admitting: Pulmonary Disease

## 2022-09-29 MED ORDER — AZITHROMYCIN 250 MG PO TABS: ORAL_TABLET | ORAL | 3 refills | Status: DC

## 2022-09-29 NOTE — Telephone Encounter (Signed)
Ok to refill. Azithro 250 mg 3 times a week. Diagnosis:Bronchiectasis.

## 2022-09-30 DIAGNOSIS — M4854XS Collapsed vertebra, not elsewhere classified, thoracic region, sequela of fracture: Secondary | ICD-10-CM | POA: Diagnosis not present

## 2022-09-30 DIAGNOSIS — M4854XD Collapsed vertebra, not elsewhere classified, thoracic region, subsequent encounter for fracture with routine healing: Secondary | ICD-10-CM | POA: Diagnosis not present

## 2022-10-07 DIAGNOSIS — M4854XD Collapsed vertebra, not elsewhere classified, thoracic region, subsequent encounter for fracture with routine healing: Secondary | ICD-10-CM | POA: Diagnosis not present

## 2022-10-07 DIAGNOSIS — M4854XS Collapsed vertebra, not elsewhere classified, thoracic region, sequela of fracture: Secondary | ICD-10-CM | POA: Diagnosis not present

## 2022-10-12 DIAGNOSIS — M4854XS Collapsed vertebra, not elsewhere classified, thoracic region, sequela of fracture: Secondary | ICD-10-CM | POA: Diagnosis not present

## 2022-10-12 DIAGNOSIS — M4854XD Collapsed vertebra, not elsewhere classified, thoracic region, subsequent encounter for fracture with routine healing: Secondary | ICD-10-CM | POA: Diagnosis not present

## 2022-10-14 DIAGNOSIS — M4854XD Collapsed vertebra, not elsewhere classified, thoracic region, subsequent encounter for fracture with routine healing: Secondary | ICD-10-CM | POA: Diagnosis not present

## 2022-10-14 DIAGNOSIS — M4854XS Collapsed vertebra, not elsewhere classified, thoracic region, sequela of fracture: Secondary | ICD-10-CM | POA: Diagnosis not present

## 2022-10-19 DIAGNOSIS — M4854XS Collapsed vertebra, not elsewhere classified, thoracic region, sequela of fracture: Secondary | ICD-10-CM | POA: Diagnosis not present

## 2022-10-19 DIAGNOSIS — M4854XD Collapsed vertebra, not elsewhere classified, thoracic region, subsequent encounter for fracture with routine healing: Secondary | ICD-10-CM | POA: Diagnosis not present

## 2022-10-21 DIAGNOSIS — M4854XS Collapsed vertebra, not elsewhere classified, thoracic region, sequela of fracture: Secondary | ICD-10-CM | POA: Diagnosis not present

## 2022-10-21 DIAGNOSIS — M4854XD Collapsed vertebra, not elsewhere classified, thoracic region, subsequent encounter for fracture with routine healing: Secondary | ICD-10-CM | POA: Diagnosis not present

## 2022-10-25 ENCOUNTER — Other Ambulatory Visit: Payer: Self-pay

## 2022-10-25 DIAGNOSIS — G250 Essential tremor: Secondary | ICD-10-CM

## 2022-10-25 MED ORDER — PROPRANOLOL HCL 10 MG PO TABS: ORAL_TABLET | ORAL | 0 refills | Status: DC

## 2022-10-25 NOTE — Telephone Encounter (Signed)
Looks like she's due for general follow up in March 2024, please schedule.

## 2022-10-25 NOTE — Telephone Encounter (Signed)
Rx request sent to PCP.

## 2022-10-25 NOTE — Telephone Encounter (Signed)
Last visit 02/11/2022 Next none scheduled  Last refill 01/22/2022 90 days with 2 refills

## 2022-10-26 DIAGNOSIS — M4854XD Collapsed vertebra, not elsewhere classified, thoracic region, subsequent encounter for fracture with routine healing: Secondary | ICD-10-CM | POA: Diagnosis not present

## 2022-10-26 DIAGNOSIS — M4854XS Collapsed vertebra, not elsewhere classified, thoracic region, sequela of fracture: Secondary | ICD-10-CM | POA: Diagnosis not present

## 2022-10-26 NOTE — Telephone Encounter (Signed)
Patient scheduled.

## 2022-10-28 DIAGNOSIS — M4854XD Collapsed vertebra, not elsewhere classified, thoracic region, subsequent encounter for fracture with routine healing: Secondary | ICD-10-CM | POA: Diagnosis not present

## 2022-10-28 DIAGNOSIS — M4854XS Collapsed vertebra, not elsewhere classified, thoracic region, sequela of fracture: Secondary | ICD-10-CM | POA: Diagnosis not present

## 2022-11-02 DIAGNOSIS — M4854XD Collapsed vertebra, not elsewhere classified, thoracic region, subsequent encounter for fracture with routine healing: Secondary | ICD-10-CM | POA: Diagnosis not present

## 2022-11-02 DIAGNOSIS — M4854XS Collapsed vertebra, not elsewhere classified, thoracic region, sequela of fracture: Secondary | ICD-10-CM | POA: Diagnosis not present

## 2022-11-04 ENCOUNTER — Encounter: Payer: Self-pay | Admitting: Pulmonary Disease

## 2022-11-04 ENCOUNTER — Ambulatory Visit (INDEPENDENT_AMBULATORY_CARE_PROVIDER_SITE_OTHER): Payer: Medicare Other | Admitting: Pulmonary Disease

## 2022-11-04 VITALS — BP 118/72 | HR 60 | Temp 97.3°F | Ht 68.0 in | Wt 108.0 lb

## 2022-11-04 DIAGNOSIS — K224 Dyskinesia of esophagus: Secondary | ICD-10-CM | POA: Diagnosis not present

## 2022-11-04 DIAGNOSIS — J479 Bronchiectasis, uncomplicated: Secondary | ICD-10-CM | POA: Diagnosis not present

## 2022-11-04 DIAGNOSIS — R0602 Shortness of breath: Secondary | ICD-10-CM

## 2022-11-04 DIAGNOSIS — J849 Interstitial pulmonary disease, unspecified: Secondary | ICD-10-CM | POA: Diagnosis not present

## 2022-11-04 DIAGNOSIS — R053 Chronic cough: Secondary | ICD-10-CM | POA: Diagnosis not present

## 2022-11-04 DIAGNOSIS — K219 Gastro-esophageal reflux disease without esophagitis: Secondary | ICD-10-CM

## 2022-11-04 LAB — NITRIC OXIDE: Nitric Oxide: 21

## 2022-11-04 MED ORDER — AIRSUPRA 90-80 MCG/ACT IN AERO: 2.0000 | INHALATION_SPRAY | Freq: Four times a day (QID) | RESPIRATORY_TRACT | 0 refills | Status: DC | PRN

## 2022-11-04 NOTE — Progress Notes (Signed)
Subjective:    Patient ID: Crystal Gregory, female    DOB: 1932/09/25, 89 y.o.   MRN: [id number] Patient Care Team: Pleas Koch, NP as PCP - General (Internal Medicine) Minna Merritts, MD as PCP - Cardiology (Cardiology) Minna Merritts, MD as Consulting Physician (Cardiology) Bo Merino, MD as Consulting Physician (Rheumatology) Tyler Pita, MD as Consulting Physician (Pulmonary Disease)  Chief Complaint  Patient presents with   Follow-up    SOB with exertion. No wheezing. Cough with green and yellow sputum.    PROBLEMS: History of prior ARDS 1999 Prior long term nitrofurantoin therapy Pulmonary fibrosis Bronchiectasis Mild COPD Rheumatoid arthritis (RF/CCP positive) Dysphagia with chronic silent aspiration   PT PROFILE: 89 y.o. with remote minimal smoking history (15 PY, quit 1982) initially evaluated by Dr Stevenson Clinch 03/2016 and subsequently by Dr Chase Caller for pulmonary fibrosis. Pt has prior history of severe ARDS (1999) and had been on chronic nitrofurantoin for recurrent UTIs. She has been tried on 2 maintenance inhalers without discernible benefit.   DATA: CT chest 03/22/16: Generalized pulmonary hyperinflation but without bullous emphysema. Widespread bronchiectasis with bronchial wall thickening consistent with inflammatory bronchitis. Areas of pulmonary fibrosis, most pronounced in the lower lobes, where the bronchiectasis is more extensive, including areas of pulmonary lung destruction HRCT chest 06/11/16: Diffuse cylindrical and varicoid bronchiectasis throughout both lungs, most severe at the lung bases. Basilar predominant fibrotic interstitial lung disease with patchy honeycombing, with mild progression in the short interval since 03/22/2016 Spirometry 06/18/16: Mild obstruction, FEV1 1.42 liters (63%), FVC 2.43 (80%) Bronchoscopy 07/05/16: Normal airway exam. BAL negative for AFB Echocardiogram 10/07/15: LVEF 60%, Grade I DD, mild MR, RVSP  estimate 35 mmHg HRCT 11/04/16: again compatible with interstitial lung disease, and considered diagnostic of usual interstitial pneumonia (UIP) from an imaging standpoint. There has been no significant progression of disease compared to the recent prior examination PFT 02/10/17: no obstruction, normal TLC, moderate reduction in DLCO 6MWT 03/11/17: 345 m. No desaturation PFTs 11/22/17: No obstruction.  Lung volumes normal.  DLCO moderately reduced (55% predicted). [place] since 02/10/17 PFTs 11/23/18 : FVC: 2.67 L (95 %pred), FEV1: 2.35 L (112 %pred), FEV1/FVC: 88%, TLC: 4.98 L (88 %pred), DLCO 42 %pred 2D echo 11/16/2019:Left ventricular ejection fraction, by estimation, is 60 to 65%. Left ventricular diastolic parameters were normal.Right ventricular systolic function is normal. The right ventricular size is normal. Mildly elevated pulmonary artery systolic pressure. The estimated right ventricular systolic pressure is 49.1 mmHg. Left atrial size was mildly dilated.Tricuspid valve regurgitation is mild to moderate CT chest 02/15/2020:The appearance of the lungs is compatible with interstitial lung disease, with a spectrum of findings considered diagnostic of usual interstitial pneumonia (UIP) per current ATS guidelines. Minimal progression compared to the prior examination. Barium swallow study 03/05/2020: Tertiary contractions of the esophagus mild spasm, one episode of tracheal aspiration noted without cough reflex, mild GERD. Overnight pulse oximetry 08/18/2020: No evidence of significant nocturnal desaturation. PFTs 08/31/2020: FEV1 2.16 L or 102% predicted, FVC 2.30 L or 80% predicted, FEV1 FVC 94% predicted, no bronchodilator response.  TLC 4.46 L or 78% predicted, DLCO 37% predicted. QuantiFERON gold 03/12/2021: Negative. CT high-res 03/24/2021: Progressively worsening interstitial lung disease likely UIP.  Markedly worsening bronchiectasis in the right lower lobe with acute airspace consolidation.   Pneumonia considered.  Trace partially loculated right pleural effusion. PFTs 03/25/2021: FEV1 1.38 L or 66% predicted, FVC 1.90 L or 67% predicted FEV1/FVC 73% no bronchodilator response.  Air-trapping noted.  Overall worsening on FEV1  from prior likely related to acute illness diffusion capacity severely reduced. Scleroderma panel 04/01/2021: Negative Chest CT without contrast 05/05/2021: No significant change in pulmonary fibrosis bronchiectasis and areas of honeycombing at the lung bases.  Are consistent with UIP.  The previously noted infiltrate has resolved. PFTs 06/02/2021: FEV1 1.59 L or 76% predicted, FVC 2.08 L or 74% predicted, FEV1/FVC 76%, no bronchodilator response.  Lung volumes normal, improvement from prior PFTs noted.  Diffusion capacity severely reduced however improved from prior. 2D echocardiogram 07/09/2021: LVEF 60 to 65%, grade 2 DD, mildly elevated pulmonary artery systolic pressure, RV systolic pressure 78.4 mmHg mild aortic sclerosis without stenosis.  Stable from prior. Swallow evaluation 07/10/2021: Normal swallow function.  No laryngeal penetration or tracheal aspiration. Chest x-ray PA and lateral 01/14/2022: Findings consistent with chronic pulmonary fibrosis and senescent changes, small right pleural effusion, 10 mm nodular density in the right perihilar region unchanged from 2020 CT chest.   INTERVAL: Last encounter was 13 July 2022, at that time she complaining of was likely related to her anemia.  Since that visit she has not had any exacerbations.  HPI Crystal Gregory presents today for a scheduled follow-up of the issues noted above.   She is following antireflux measures carefully. With regards to cough and dyspnea these are at baseline, not worsening.  Her dyspnea has always been worse in the evenings does not change.  Cough is actually relatively well controlled since she started being more careful with antireflux measures.  She has "good days and bad days" with  regards to her cough.  She does have some bronchiectasis and at times produces copious amounts of sputum.  She has declined chest physiotherapy device.  Her dyspnea is mostly related to when she "hurries" but not when she is performing activities of daily living and "paces herself". She has not had any further issues with thoracolumbar pain.she did however have rheumatoid arthritis flareup mostly manifested by hand swelling and pain and inability to use her hands well.  She had to be treated with that prednisone taper by Dr. Posey Pronto.  She does endorse some issues with gastroesophageal reflux at times refluxes and gets sour taste in the back of the throat with "rancid" taste and smell.  She has been evaluated by GI for these issues.  She does not endorse any other new symptomatology today.   Review of Systems A 10 point review of systems was performed and it is as noted above otherwise negative.  Patient Active Problem List   Diagnosis Date Noted   Elevated brain natriuretic peptide (BNP) level 01/29/2022   Edema of right lower extremity 69/62/9528   Diastolic dysfunction with acute on chronic heart failure ([place]) 01/29/2022   Baker's cyst, right 01/14/2022   Asymptomatic varicose veins of both lower extremities 01/14/2022   DOE (dyspnea on exertion) 01/14/2022   Compression fracture of thoracic spine, non-traumatic ([place]) 11/26/2021   Tremor of both hands 11/26/2021   B12 deficiency 09/02/2020   Anemia 08/06/2020   TMJ (temporomandibular joint syndrome) 03/02/2018   RA (rheumatoid arthritis) ([place]) 07/13/2017   Polyarthralgia 04/25/2017   ILD (interstitial lung disease) ([place]) 06/18/2016   IPF (idiopathic pulmonary fibrosis) ([place]) 03/23/2016   Bronchiectasis without complication ([place]) 41/32/4401   Allergic rhinitis 03/22/2016   Anal fissure 08/18/2015   Insomnia 08/18/2015   Osteoporosis 02/13/2015   Stress due to illness of family member 09/16/2014   Hiatal hernia 10/13/2012   GERD  (gastroesophageal reflux disease)    Allergy    Social  History   Tobacco Use   Smoking status: Former    Packs/day: 0.25    Years: 20.00    Total pack years: 5.00    Types: Cigarettes    Quit date: 01/16/1981    Years since quitting: 41.8   Smokeless tobacco: Never  Substance Use Topics   Alcohol use: Yes    Alcohol/week: 7.0 standard drinks of alcohol    Types: 7 Standard drinks or equivalent per week   No Known Allergies  Current Meds  Medication Sig   azaTHIOprine (IMURAN) 50 MG tablet TAKE ONE AND ONE-HALF TABLETS (75 MG) EVERY MORNING AND 1 TABLET (50 MG) EVERY EVENING   azithromycin (ZITHROMAX) 250 MG tablet Take 1 tablet 3x a week   denosumab (PROLIA) 60 MG/ML SOLN injection Inject 60 mg into the skin every 6 (six) months. Administer in upper arm, thigh, or abdomen   gabapentin (NEURONTIN) 100 MG capsule Take 2 capsules (200 mg total) by mouth at bedtime.   Multiple Minerals-Vitamins (CALCIUM & VIT D3 BONE HEALTH PO) Take 1 Dose by mouth daily.   pantoprazole (PROTONIX) 20 MG tablet Take 1 tablet (20 mg total) by mouth 2 (two) times daily. For heartburn   predniSONE (DELTASONE) 1 MG tablet Take 2 tablets (2 mg total) by mouth daily with breakfast.   propranolol (INDERAL) 10 MG tablet TAKE 1 TABLET TWICE A DAY FOR TREMOR   traZODone (DESYREL) 50 MG tablet Take 1 tablet (50 mg total) by mouth at bedtime as needed for sleep. For sleep.   vitamin B-12 (CYANOCOBALAMIN) 500 MCG tablet Take 500 mcg by mouth daily.   Immunization History  Administered Date(s) Administered   Fluad Quad(high Dose 65+) 07/24/2019, 07/04/2022   Influenza Split 06/04/2012   Influenza, High Dose Seasonal PF 09/10/2016   Influenza,inj,Quad PF,6+ Mos 07/04/2013, 08/18/2015, 07/13/2017, 06/19/2018   Influenza-Unspecified 07/15/2020   Moderna SARS-COV2 Booster Vaccination 05/19/2020, 02/19/2021   Moderna Sars-Covid-2 Vaccination 10/22/2019, 11/19/2019   Pneumococcal Conjugate-13 10/06/2011    Pneumococcal Polysaccharide-23 06/19/2018   Zoster, Live 01/14/2007       Objective:   Physical Exam BP 118/72 (BP Location: Left Arm, Cuff Size: Normal)   Pulse 60   Temp (!) 97.3 F (36.3 C)   Ht '5\' 8"'$  (1.727 m)   Wt 108 lb (49 kg)   SpO2 100%   BMI 16.42 kg/m   SpO2: 100 % O2 Device: None (Room air)  GENERAL: Chronically ill-appearing, thin, well-developed woman in no acute distress.  Fully ambulatory.  She is actually quite spry.  She is chronically pale. HEAD: Normocephalic, atraumatic. EYES: Pupils equal, round, reactive to light.  No scleral icterus. MOUTH: Dentition intact, oral mucosa moist.  No thrush. NECK: Supple. No thyromegaly. Trachea midline. No JVD.  No adenopathy. PULMONARY: Good air entry bilaterally.  Coarse breath sounds at the bases, otherwise no adventitious sounds.   CARDIOVASCULAR: S1 and S2. Regular rate and rhythm.  No rubs, murmurs or gallops heard.   ABDOMEN: Nondistended, scaphoid.   MUSCULOSKELETAL: Mild rheumatoid arthritis changes from hands, mild active synovitis, no clubbing, no edema. NEUROLOGIC: No overt focal deficit, no gait disturbance, speech is fluent. SKIN: Intact,warm,dry. PSYCH: Mood and behavior are normal   Lab Results  Component Value Date   NITRICOXIDE 21 11/04/2022  *No evidence of type II inflammation    Assessment & Plan:     ICD-10-CM   1. ILD (interstitial lung disease) (HCC)  J84.9    Multifactorial Prior ARDS/RA/prior Macrodantin use    2. Bronchiectasis  without complication ([place])  Z61.0    Patient declines vest physiotherapy She does postural drainage on her own Continue azithromycin 3 times weekly    3. Chronic cough  R05.3 Nitric oxide   No evidence of airway inflammation Mostly driven by reflux Better with gabapentin    4. Esophageal dysmotility  K22.4    This issue adds complexity to her management Tendency to reflux/aspiration Patient follows strict antireflux measures On Protonix per GI    5.  Gastroesophageal reflux disease, unspecified whether esophagitis present  K21.9    This issue adds complexity to her management Makes her at risk for aspiration As above    6. Shortness of breath  R06.02    Mostly towards the end of the day Trial of AirSupra as needed     Orders Placed This Encounter  Procedures   Nitric oxide   Meds ordered this encounter  Medications   Albuterol-Budesonide (AIRSUPRA) 90-80 MCG/ACT AERO    Sig: Inhale 2 puffs into the lungs every 6 (six) hours as needed.    Dispense:  10.7 g    Refill:  0    Order Specific Question:   Lot Number?    Answer:   [id number]    Order Specific Question:   Expiration Date?    Answer:   11/05/2023    Order Specific Question:   Manufacturer?    Answer:   [employer] [[id number]]    Order Specific Question:   Quantity    Answer:   1   Overall Crystal Gregory appears to be at baseline.  We will see her in follow-up in 2 to 3 months time call sooner should any new problems arise.  Renold Don, MD Advanced Bronchoscopy PCCM [facility]    *This note was dictated using voice recognition software/Dragon.  Despite best efforts to proofread, errors can occur which can change the meaning. Any transcriptional errors that result from this process are unintentional and may not be fully corrected at the time of dictation.

## 2022-11-04 NOTE — Patient Instructions (Signed)
I am giving you a sample of an inhaler called AirSupra this will be 2 puffs every 6 hours as needed for cough or shortness of breath.  Make sure you rinse your mouth well after you use it.  Let us know how you do with the inhaler.  Will see you in follow-up in 2 to 3 months time call sooner should any new problems arise.

## 2022-11-05 DIAGNOSIS — M4854XS Collapsed vertebra, not elsewhere classified, thoracic region, sequela of fracture: Secondary | ICD-10-CM | POA: Diagnosis not present

## 2022-11-05 DIAGNOSIS — M4854XD Collapsed vertebra, not elsewhere classified, thoracic region, subsequent encounter for fracture with routine healing: Secondary | ICD-10-CM | POA: Diagnosis not present

## 2022-11-09 DIAGNOSIS — M4854XS Collapsed vertebra, not elsewhere classified, thoracic region, sequela of fracture: Secondary | ICD-10-CM | POA: Diagnosis not present

## 2022-11-09 DIAGNOSIS — M4854XD Collapsed vertebra, not elsewhere classified, thoracic region, subsequent encounter for fracture with routine healing: Secondary | ICD-10-CM | POA: Diagnosis not present

## 2022-11-10 ENCOUNTER — Telehealth: Payer: Self-pay

## 2022-11-10 NOTE — Telephone Encounter (Signed)
Please start new PA for Prolia inj.  Patient's next inj is due on or after 11/28/2022.  Thank you

## 2022-11-11 DIAGNOSIS — M4854XS Collapsed vertebra, not elsewhere classified, thoracic region, sequela of fracture: Secondary | ICD-10-CM | POA: Diagnosis not present

## 2022-11-11 DIAGNOSIS — M4854XD Collapsed vertebra, not elsewhere classified, thoracic region, subsequent encounter for fracture with routine healing: Secondary | ICD-10-CM | POA: Diagnosis not present

## 2022-11-15 NOTE — Telephone Encounter (Signed)
Prolia VOB initiated via [email or web address] 

## 2022-11-16 DIAGNOSIS — M4854XD Collapsed vertebra, not elsewhere classified, thoracic region, subsequent encounter for fracture with routine healing: Secondary | ICD-10-CM | POA: Diagnosis not present

## 2022-11-16 DIAGNOSIS — M4854XS Collapsed vertebra, not elsewhere classified, thoracic region, sequela of fracture: Secondary | ICD-10-CM | POA: Diagnosis not present

## 2022-11-17 NOTE — Telephone Encounter (Signed)
Pt ready for scheduling on or after 11/28/22  Out-of-pocket cost due at time of visit: $0  Primary: Medicare Prolia co-insurance: 0% Admin fee co-insurance: 0%  Secondary: [employer] Prolia co-insurance:  Admin fee co-insurance:   Deductible: $137.39 met of $240 required (covered by secondary)  Prior Auth: not required PA# Valid:     ** This summary of benefits is an estimation of the patient's out-of-pocket cost. Exact cost may very based on individual plan coverage.

## 2022-11-18 ENCOUNTER — Other Ambulatory Visit: Payer: Self-pay

## 2022-11-18 DIAGNOSIS — M4854XS Collapsed vertebra, not elsewhere classified, thoracic region, sequela of fracture: Secondary | ICD-10-CM | POA: Diagnosis not present

## 2022-11-18 DIAGNOSIS — M810 Age-related osteoporosis without current pathological fracture: Secondary | ICD-10-CM

## 2022-11-18 DIAGNOSIS — M4854XD Collapsed vertebra, not elsewhere classified, thoracic region, subsequent encounter for fracture with routine healing: Secondary | ICD-10-CM | POA: Diagnosis not present

## 2022-11-18 NOTE — Telephone Encounter (Signed)
Called patient to sch appts for labs and Prolia inj.  Lab appt on 11/24/22 and Prolia inj on 12/01/22.  Patient aware estimated amt due is $0.  Check labs for Prolia inj.

## 2022-11-23 DIAGNOSIS — M4854XS Collapsed vertebra, not elsewhere classified, thoracic region, sequela of fracture: Secondary | ICD-10-CM | POA: Diagnosis not present

## 2022-11-23 DIAGNOSIS — M4854XD Collapsed vertebra, not elsewhere classified, thoracic region, subsequent encounter for fracture with routine healing: Secondary | ICD-10-CM | POA: Diagnosis not present

## 2022-11-24 ENCOUNTER — Other Ambulatory Visit (INDEPENDENT_AMBULATORY_CARE_PROVIDER_SITE_OTHER): Payer: Medicare Other

## 2022-11-24 DIAGNOSIS — M810 Age-related osteoporosis without current pathological fracture: Secondary | ICD-10-CM | POA: Diagnosis not present

## 2022-11-24 LAB — BASIC METABOLIC PANEL
BUN: 27 mg/dL — ABNORMAL HIGH (ref 6–23)
CO2: 27 mEq/L (ref 19–32)
Calcium: 9.7 mg/dL (ref 8.4–10.5)
Chloride: 101 mEq/L (ref 96–112)
Creatinine, Ser: 0.77 mg/dL (ref 0.40–1.20)
GFR: 68.63 mL/min (ref >60.00)
Glucose, Bld: 77 mg/dL (ref 70–99)
Potassium: 4.2 mEq/L (ref 3.5–5.1)
Sodium: 138 mEq/L (ref 135–145)

## 2022-11-25 DIAGNOSIS — M4854XS Collapsed vertebra, not elsewhere classified, thoracic region, sequela of fracture: Secondary | ICD-10-CM | POA: Diagnosis not present

## 2022-11-25 DIAGNOSIS — M4854XD Collapsed vertebra, not elsewhere classified, thoracic region, subsequent encounter for fracture with routine healing: Secondary | ICD-10-CM | POA: Diagnosis not present

## 2022-11-25 NOTE — Telephone Encounter (Signed)
Labs ok for Prolia inj on 12/01/22.  Estimated Creatinine clearance:  39.06 ml/min.

## 2022-11-30 DIAGNOSIS — M4854XS Collapsed vertebra, not elsewhere classified, thoracic region, sequela of fracture: Secondary | ICD-10-CM | POA: Diagnosis not present

## 2022-11-30 DIAGNOSIS — M4854XD Collapsed vertebra, not elsewhere classified, thoracic region, subsequent encounter for fracture with routine healing: Secondary | ICD-10-CM | POA: Diagnosis not present

## 2022-12-01 ENCOUNTER — Ambulatory Visit (INDEPENDENT_AMBULATORY_CARE_PROVIDER_SITE_OTHER): Payer: Medicare Other

## 2022-12-01 DIAGNOSIS — M810 Age-related osteoporosis without current pathological fracture: Secondary | ICD-10-CM | POA: Diagnosis not present

## 2022-12-01 MED ORDER — DENOSUMAB 60 MG/ML ~~LOC~~ SOSY
60.0000 mg | PREFILLED_SYRINGE | Freq: Once | SUBCUTANEOUS | Status: AC
  Administered 2022-12-01: 60 mg via SUBCUTANEOUS

## 2022-12-01 NOTE — Progress Notes (Addendum)
Per orders of Allie Bossier NP, injection of Prolia 60 mg  in right arm given by Ozzie Hoyle. Patient tolerated injection well.

## 2022-12-02 DIAGNOSIS — M4854XS Collapsed vertebra, not elsewhere classified, thoracic region, sequela of fracture: Secondary | ICD-10-CM | POA: Diagnosis not present

## 2022-12-02 DIAGNOSIS — M4854XD Collapsed vertebra, not elsewhere classified, thoracic region, subsequent encounter for fracture with routine healing: Secondary | ICD-10-CM | POA: Diagnosis not present

## 2022-12-07 DIAGNOSIS — M4854XD Collapsed vertebra, not elsewhere classified, thoracic region, subsequent encounter for fracture with routine healing: Secondary | ICD-10-CM | POA: Diagnosis not present

## 2022-12-07 DIAGNOSIS — M4854XS Collapsed vertebra, not elsewhere classified, thoracic region, sequela of fracture: Secondary | ICD-10-CM | POA: Diagnosis not present

## 2022-12-09 DIAGNOSIS — M4854XS Collapsed vertebra, not elsewhere classified, thoracic region, sequela of fracture: Secondary | ICD-10-CM | POA: Diagnosis not present

## 2022-12-09 DIAGNOSIS — M4854XD Collapsed vertebra, not elsewhere classified, thoracic region, subsequent encounter for fracture with routine healing: Secondary | ICD-10-CM | POA: Diagnosis not present

## 2022-12-11 ENCOUNTER — Encounter: Payer: Self-pay | Admitting: Pulmonary Disease

## 2022-12-14 ENCOUNTER — Encounter: Payer: Self-pay | Admitting: Pulmonary Disease

## 2022-12-14 DIAGNOSIS — M4854XD Collapsed vertebra, not elsewhere classified, thoracic region, subsequent encounter for fracture with routine healing: Secondary | ICD-10-CM | POA: Diagnosis not present

## 2022-12-14 DIAGNOSIS — M4854XS Collapsed vertebra, not elsewhere classified, thoracic region, sequela of fracture: Secondary | ICD-10-CM | POA: Diagnosis not present

## 2022-12-14 MED ORDER — GABAPENTIN 100 MG PO CAPS: 200.0000 mg | ORAL_CAPSULE | Freq: Every day | ORAL | 1 refills | Status: DC

## 2022-12-16 DIAGNOSIS — M4854XD Collapsed vertebra, not elsewhere classified, thoracic region, subsequent encounter for fracture with routine healing: Secondary | ICD-10-CM | POA: Diagnosis not present

## 2022-12-16 DIAGNOSIS — M4854XS Collapsed vertebra, not elsewhere classified, thoracic region, sequela of fracture: Secondary | ICD-10-CM | POA: Diagnosis not present

## 2022-12-21 ENCOUNTER — Encounter: Payer: Self-pay | Admitting: Pulmonary Disease

## 2022-12-21 DIAGNOSIS — M4854XD Collapsed vertebra, not elsewhere classified, thoracic region, subsequent encounter for fracture with routine healing: Secondary | ICD-10-CM | POA: Diagnosis not present

## 2022-12-21 DIAGNOSIS — M4854XS Collapsed vertebra, not elsewhere classified, thoracic region, sequela of fracture: Secondary | ICD-10-CM | POA: Diagnosis not present

## 2022-12-21 MED ORDER — MONTELUKAST SODIUM 10 MG PO TABS: 10.0000 mg | ORAL_TABLET | Freq: Every day | ORAL | 0 refills | Status: DC

## 2022-12-22 ENCOUNTER — Encounter: Payer: Self-pay | Admitting: Primary Care

## 2022-12-22 ENCOUNTER — Ambulatory Visit (INDEPENDENT_AMBULATORY_CARE_PROVIDER_SITE_OTHER): Payer: Medicare Other | Admitting: Primary Care

## 2022-12-22 VITALS — BP 100/64 | HR 70 | Temp 97.7°F | Ht 68.0 in | Wt 107.0 lb

## 2022-12-22 DIAGNOSIS — M069 Rheumatoid arthritis, unspecified: Secondary | ICD-10-CM

## 2022-12-22 DIAGNOSIS — J849 Interstitial pulmonary disease, unspecified: Secondary | ICD-10-CM | POA: Diagnosis not present

## 2022-12-22 DIAGNOSIS — J84112 Idiopathic pulmonary fibrosis: Secondary | ICD-10-CM | POA: Diagnosis not present

## 2022-12-22 DIAGNOSIS — M8000XS Age-related osteoporosis with current pathological fracture, unspecified site, sequela: Secondary | ICD-10-CM | POA: Diagnosis not present

## 2022-12-22 DIAGNOSIS — K219 Gastro-esophageal reflux disease without esophagitis: Secondary | ICD-10-CM | POA: Diagnosis not present

## 2022-12-22 DIAGNOSIS — I5033 Acute on chronic diastolic (congestive) heart failure: Secondary | ICD-10-CM

## 2022-12-22 DIAGNOSIS — R251 Tremor, unspecified: Secondary | ICD-10-CM | POA: Diagnosis not present

## 2022-12-22 DIAGNOSIS — G4700 Insomnia, unspecified: Secondary | ICD-10-CM | POA: Diagnosis not present

## 2022-12-22 NOTE — Assessment & Plan Note (Signed)
Improved compared to Bone density scan from April 2023, reviewed with patient.  Continue Prolia 60 mg semi-annually.   Repeat bone density scan in 2025.

## 2022-12-22 NOTE — Assessment & Plan Note (Signed)
Appears euvolemic today.  No longer on furosemide or torsemide. Continue to monitor.

## 2022-12-22 NOTE — Assessment & Plan Note (Signed)
Overall stable.  Continue prednisone 2 mg daily, Imuran 75 mg in AM and 50 mg PM.

## 2022-12-22 NOTE — Assessment & Plan Note (Signed)
Controlled.  Continue propranolol 10 mg BID.

## 2022-12-22 NOTE — Assessment & Plan Note (Signed)
Controlled.  Continue Trazodone 50 mg HS PRN. 

## 2022-12-22 NOTE — Assessment & Plan Note (Signed)
Controlled.  Continue pantoprazole 20 mg BID.  

## 2022-12-22 NOTE — Progress Notes (Signed)
Subjective:    Patient ID: Crystal Gregory, female    DOB: 17-Oct-1931, 89 y.o.   MRN: [id number]  HPI  Crystal Gregory is a very pleasant 89 y.o. female with a history of CHF, IPF, ILD, GERD, osteoporosis, RA, insomnia, polyarthralgia, bilateral hand tremors who presents today for follow up of chronic conditions.   1) IPF/ILD: Following with pulmonology and is managed on azithromycin 250 mg three times weekly, Singulair 10 mg HS, gabapentin 200 mg HS. Last pulmonology visit was in February 2024, no changes made during this visit.   2) RA/Polyarthralgia: Currently following with rheumatology and is managed on Imuran 75 mg in AM and 50 mg in PM, prednisone 2 mg daily.   Overall feels well managed, last visit was in December 2023, no changes made during this visit.   3) Tremors: Currently managed on propranolol 10 mg twice daily and Trazodone 50 mg HS PRN. Feels well managed on this regimen.   4) Osteoporosis: Currently managed on Prolia 60 mg semi-annually. Last bone density scan was in April 2023 which showed improvement compared to bone density scan from 2021.   She is needing another referral for physical therapy which has significantly helped with joint pain and overall strength.    Review of Systems  Respiratory:  Positive for shortness of breath.   Cardiovascular:  Negative for chest pain.  Gastrointestinal:  Negative for constipation and diarrhea.  Neurological:  Negative for dizziness and headaches.  Psychiatric/Behavioral:  The patient is not nervous/anxious.          Past Medical History:  Diagnosis Date   Allergy    Anal fissure    Aspiration into airway    CAP (community acquired pneumonia) 08/23/2017   Chest pain    a. 09/04/2018 MV: EF >65%. No ischemia/infarct.   Coronary artery calcification seen on CT scan    Diastolic dysfunction    a. 10/2016 Echo: EF 60-65%, no rwma, Gr1 DD, mild MR, nl RV fxn, mild to mod TR, PASP 38mmHg.   Diverticulosis    Dyspnea     GERD (gastroesophageal reflux disease)    Hemorrhoids    Hyperplastic colon polyp    Interstitial lung disorders ([place])    Pulmonary fibrosis ([place])    Septicemia ([place]) 1999   spent 4 weeks in ICU, intubated -? PNA   Vertebral fracture, pathological     Social History   Socioeconomic History   Marital status: Married    Spouse name: Not on file   Number of children: 2   Years of education: Not on file   Highest education level: Not on file  Occupational History   Occupation: Retired   Tobacco Use   Smoking status: Former    Packs/day: 0.25    Years: 20.00    Additional pack years: 0.00    Total pack years: 5.00    Types: Cigarettes    Quit date: 01/16/1981    Years since quitting: 41.9   Smokeless tobacco: Never  Vaping Use   Vaping Use: Never used  Substance and Sexual Activity   Alcohol use: Yes    Alcohol/week: 7.0 standard drinks of alcohol    Types: 7 Standard drinks or equivalent per week   Drug use: No   Sexual activity: Not on file  Other Topics Concern   Not on file  Social History Narrative   Married.     2 children.  Lives with her husband in [place].   Social Determinants of Health  Financial Resource Strain: Low Risk  (04/26/2022)   Overall Financial Resource Strain (CARDIA)    Difficulty of Paying Living Expenses: Not hard at all  Food Insecurity: No Food Insecurity (04/26/2022)   Hunger Vital Sign    Worried About Running Out of Food in the Last Year: Never true    Ran Out of Food in the Last Year: Never true  Transportation Needs: No Transportation Needs (04/26/2022)   PRAPARE - Hydrologist (Medical): No    Lack of Transportation (Non-Medical): No  Physical Activity: Insufficiently Active (04/26/2022)   Exercise Vital Sign    Days of Exercise per Week: 2 days    Minutes of Exercise per Session: 30 min  Stress: No Stress Concern Present (04/26/2022)   [place]    Feeling of Stress : Not at all  Social Connections: Moderately Isolated (04/26/2022)   Social Connection and Isolation Panel [NHANES]    Frequency of Communication with Friends and Family: More than three times a week    Frequency of Social Gatherings with Friends and Family: Twice a week    Attends Religious Services: More than 4 times per year    Active Member of [employer] or Organizations: No    Attends Archivist Meetings: Never    Marital Status: Widowed  Intimate Partner Violence: Unknown (04/26/2022)   Humiliation, Afraid, Rape, and Kick questionnaire    Fear of Current or Ex-Partner: No    Emotionally Abused: No    Physically Abused: No    Sexually Abused: Not on file    Past Surgical History:  Procedure Laterality Date   ABDOMINAL HYSTERECTOMY     FLEXIBLE BRONCHOSCOPY N/A 07/05/2016   Procedure: FLEXIBLE BRONCHOSCOPY;  Surgeon: Vilinda Boehringer, MD;  Location: [facility] ORS;  Service: Cardiopulmonary;  Laterality: N/A;   IR RADIOLOGIST EVAL & MGMT  11/05/2021    Family History  Problem Relation Age of Onset   Parkinson's disease Mother    Colon polyps Mother 15           Ulcers Father    Rheumatic fever Father    Macular degeneration Sister    Healthy Son    Healthy Daughter     No Known Allergies  Current Outpatient Medications on File Prior to Visit  Medication Sig Dispense Refill   azaTHIOprine (IMURAN) 50 MG tablet TAKE ONE AND ONE-HALF TABLETS (75 MG) EVERY MORNING AND 1 TABLET (50 MG) EVERY EVENING 225 tablet 0   azithromycin (ZITHROMAX) 250 MG tablet Take 1 tablet 3x a week 30 each 3   denosumab (PROLIA) 60 MG/ML SOLN injection Inject 60 mg into the skin every 6 (six) months. Administer in upper arm, thigh, or abdomen     gabapentin (NEURONTIN) 100 MG capsule Take 2 capsules (200 mg total) by mouth at bedtime. 180 capsule 1   montelukast (SINGULAIR) 10 MG tablet Take 1 tablet (10 mg total) by mouth daily. 90 tablet 0   Multiple  Minerals-Vitamins (CALCIUM & VIT D3 BONE HEALTH PO) Take 1 Dose by mouth daily.     pantoprazole (PROTONIX) 20 MG tablet Take 1 tablet (20 mg total) by mouth 2 (two) times daily. For heartburn 180 tablet 3   predniSONE (DELTASONE) 1 MG tablet Take 2 tablets (2 mg total) by mouth daily with breakfast. 180 tablet 0   propranolol (INDERAL) 10 MG tablet TAKE 1 TABLET TWICE A DAY FOR TREMOR 180 tablet 0  traZODone (DESYREL) 50 MG tablet Take 1 tablet (50 mg total) by mouth at bedtime as needed for sleep. For sleep. 90 tablet 2   vitamin B-12 (CYANOCOBALAMIN) 500 MCG tablet Take 500 mcg by mouth daily.     No current facility-administered medications on file prior to visit.    BP 100/64   Pulse 70   Temp 97.7 F (36.5 C) (Temporal)   Ht 5\' 8"  (1.727 m)   Wt 107 lb (48.5 kg)   SpO2 99%   BMI 16.27 kg/m  Objective:   Physical Exam Cardiovascular:     Rate and Rhythm: Normal rate and regular rhythm.  Pulmonary:     Effort: Pulmonary effort is normal.     Breath sounds: No wheezing.  Musculoskeletal:     Cervical back: Neck supple.  Skin:    General: Skin is warm and dry.  Neurological:     Mental Status: She is alert and oriented to person, place, and time.  Psychiatric:        Mood and Affect: Mood normal.           Assessment & Plan:  Diastolic dysfunction with acute on chronic heart failure ([place]) Assessment & Plan: Appears euvolemic today.  No longer on furosemide or torsemide. Continue to monitor.    ILD (interstitial lung disease) [facility]) Assessment & Plan: Following with pulmonology, reviewed office notes from pulmonology from February 2024. Continue gabapentin 200 mg HS, Singulair 10 mg HS, and azithromycin 250 mg three times weekly.     IPF (idiopathic pulmonary fibrosis) ([place]) Assessment & Plan: Following with pulmonology, reviewed office notes from pulmonology from February 2024. Continue gabapentin 200 mg HS, Singulair 10 mg HS, and azithromycin 250 mg  three times weekly.   Gastroesophageal reflux disease, unspecified whether esophagitis present Assessment & Plan: Controlled.  Continue pantoprazole 20 mg BID.    Age-related osteoporosis with current pathological fracture, sequela Assessment & Plan: Improved compared to Bone density scan from April 2023, reviewed with patient.  Continue Prolia 60 mg semi-annually.   Repeat bone density scan in 2025.   Rheumatoid arthritis, involving unspecified site, unspecified whether rheumatoid factor present [facility]) Assessment & Plan: Overall stable.  Continue prednisone 2 mg daily, Imuran 75 mg in AM and 50 mg PM.     Insomnia, unspecified type Assessment & Plan: Controlled.  Continue Trazodone 50 mg HS PRN.   Tremor of both hands Assessment & Plan: Controlled.  Continue propranolol 10 mg BID.          Pleas Koch, NP

## 2022-12-22 NOTE — Assessment & Plan Note (Signed)
Following with pulmonology, reviewed office notes from pulmonology from February 2024. Continue gabapentin 200 mg HS, Singulair 10 mg HS, and azithromycin 250 mg three times weekly.

## 2022-12-23 DIAGNOSIS — M4854XD Collapsed vertebra, not elsewhere classified, thoracic region, subsequent encounter for fracture with routine healing: Secondary | ICD-10-CM | POA: Diagnosis not present

## 2022-12-23 DIAGNOSIS — M4854XS Collapsed vertebra, not elsewhere classified, thoracic region, sequela of fracture: Secondary | ICD-10-CM | POA: Diagnosis not present

## 2022-12-24 DIAGNOSIS — G4700 Insomnia, unspecified: Secondary | ICD-10-CM

## 2022-12-24 MED ORDER — TRAZODONE HCL 50 MG PO TABS: 50.0000 mg | ORAL_TABLET | Freq: Every evening | ORAL | 3 refills | Status: DC | PRN

## 2023-01-05 ENCOUNTER — Encounter: Payer: Self-pay | Admitting: Oncology

## 2023-01-05 ENCOUNTER — Inpatient Hospital Stay (HOSPITAL_BASED_OUTPATIENT_CLINIC_OR_DEPARTMENT_OTHER): Payer: Medicare Other | Admitting: Oncology

## 2023-01-05 ENCOUNTER — Inpatient Hospital Stay: Payer: Medicare Other | Attending: Oncology

## 2023-01-05 VITALS — BP 106/65 | HR 71 | Temp 96.9°F | Resp 16 | Ht 68.0 in | Wt 106.9 lb

## 2023-01-05 DIAGNOSIS — D7589 Other specified diseases of blood and blood-forming organs: Secondary | ICD-10-CM | POA: Insufficient documentation

## 2023-01-05 DIAGNOSIS — Z90710 Acquired absence of both cervix and uterus: Secondary | ICD-10-CM | POA: Diagnosis not present

## 2023-01-05 DIAGNOSIS — J8410 Pulmonary fibrosis, unspecified: Secondary | ICD-10-CM | POA: Diagnosis not present

## 2023-01-05 DIAGNOSIS — M2550 Pain in unspecified joint: Secondary | ICD-10-CM | POA: Diagnosis not present

## 2023-01-05 DIAGNOSIS — M069 Rheumatoid arthritis, unspecified: Secondary | ICD-10-CM | POA: Diagnosis not present

## 2023-01-05 DIAGNOSIS — Z8719 Personal history of other diseases of the digestive system: Secondary | ICD-10-CM | POA: Insufficient documentation

## 2023-01-05 DIAGNOSIS — E538 Deficiency of other specified B group vitamins: Secondary | ICD-10-CM | POA: Diagnosis not present

## 2023-01-05 DIAGNOSIS — D638 Anemia in other chronic diseases classified elsewhere: Secondary | ICD-10-CM | POA: Diagnosis not present

## 2023-01-05 DIAGNOSIS — I2510 Atherosclerotic heart disease of native coronary artery without angina pectoris: Secondary | ICD-10-CM | POA: Diagnosis not present

## 2023-01-05 DIAGNOSIS — Z79899 Other long term (current) drug therapy: Secondary | ICD-10-CM | POA: Diagnosis not present

## 2023-01-05 DIAGNOSIS — R5383 Other fatigue: Secondary | ICD-10-CM | POA: Diagnosis not present

## 2023-01-05 DIAGNOSIS — K219 Gastro-esophageal reflux disease without esophagitis: Secondary | ICD-10-CM | POA: Diagnosis not present

## 2023-01-05 DIAGNOSIS — Z87891 Personal history of nicotine dependence: Secondary | ICD-10-CM | POA: Diagnosis not present

## 2023-01-05 LAB — CBC WITH DIFFERENTIAL/PLATELET
Abs Immature Granulocytes: 0.14 10*3/uL — ABNORMAL HIGH (ref 0.00–0.07)
Basophils Absolute: 0.1 10*3/uL (ref 0.0–0.1)
Basophils Relative: 1 %
Eosinophils Absolute: 0.1 10*3/uL (ref 0.0–0.5)
Eosinophils Relative: 1 %
HCT: 29.4 % — ABNORMAL LOW (ref 36.0–46.0)
Hemoglobin: 9.6 g/dL — ABNORMAL LOW (ref 12.0–15.0)
Immature Granulocytes: 2 %
Lymphocytes Relative: 22 %
Lymphs Abs: 1.6 10*3/uL (ref 0.7–4.0)
MCH: 36.8 pg — ABNORMAL HIGH (ref 26.0–34.0)
MCHC: 32.7 g/dL (ref 30.0–36.0)
MCV: 112.6 fL — ABNORMAL HIGH (ref 80.0–100.0)
Monocytes Absolute: 0.7 10*3/uL (ref 0.1–1.0)
Monocytes Relative: 9 %
Neutro Abs: 4.9 10*3/uL (ref 1.7–7.7)
Neutrophils Relative %: 65 %
Platelets: 372 10*3/uL (ref 150–400)
RBC: 2.61 MIL/uL — ABNORMAL LOW (ref 3.87–5.11)
RDW: 17 % — ABNORMAL HIGH (ref 11.5–15.5)
WBC: 7.5 10*3/uL (ref 4.0–10.5)
nRBC: 0 % (ref 0.0–0.2)

## 2023-01-05 LAB — IRON AND TIBC
Iron: 81 ug/dL (ref 28–170)
Saturation Ratios: 27 % (ref 10.4–31.8)
TIBC: 305 ug/dL (ref 250–450)
UIBC: 224 ug/dL

## 2023-01-05 LAB — VITAMIN B12: Vitamin B-12: 501 pg/mL (ref 180–914)

## 2023-01-05 LAB — FERRITIN: Ferritin: 24 ng/mL (ref 11–307)

## 2023-01-05 NOTE — Progress Notes (Signed)
No concerns for the provider. 

## 2023-01-08 ENCOUNTER — Encounter: Payer: Self-pay | Admitting: Oncology

## 2023-01-08 NOTE — Progress Notes (Signed)
Hematology/Oncology Consult note [facility]  [phone] [phone]  Patient Care Team: Doreene Nest, NP as PCP - General (Internal Medicine) Antonieta Iba, MD as PCP - Cardiology (Cardiology) Antonieta Iba, MD as Consulting Physician (Cardiology) Pollyann Savoy, MD as Consulting Physician (Rheumatology) Salena Saner, MD as Consulting Physician (Pulmonary Disease)   Name of the patient: Crystal Gregory  [id number]  07-Mar-1932   Date of visit: 01/08/23  Diagnosis-anemia of chronic disease  Chief complaint/ Reason for visit-routine follow-up of anemia of chronic disease  Heme/Onc history: Patient is a 89 year old female referred for macrocytic anemia.  She has a prior history of rheumatoid arthritis as well as interstitial lung disease from rheumatoid arthritis as well.  She is not on home oxygen.  She has been referred for anemia.  Dating back at her CBC is her hemoglobin was always between 10-11 even back in 2018 and 2019.  Her most recent CBC from 08/06/2020 showed white count of 4.7, H&H of 9.5/28.8 with an MCV of 114 and platelet count of 276.  She has developed progressive macrocytosis since 2019.  B12 levels were mildly low at 277.  Patient reports chronic fatigue and exertional shortness of breath.   She is on Imuran for rheumatoid arthritis   Results of blood work from 08/11/2020 were as follows: CBC showed white count of 4.8, H&H of 9.9/29.6 with an MCV of 114 and a platelet count of 300.  B12 level was low at 277.  Folate and TSH was normal.  EPO level was elevated at 100.  Haptoglobin was normal.  Myeloma panel showed polyclonal increase but no monoclonal protein.  Reticulocyte count was inappropriately low for the degree of anemia at 2.2%.  CMP showed normal kidney and liver functions.  Interval history-patient lives alone and is independent of her ADLs and IADLs.  She does report worsening fatigue over the last  few weeks.  She has chronic joint pain from rheumatoid arthritis.  ECOG PS- 2 Pain scale- 3  Review of systems- Review of Systems  Constitutional:  Positive for malaise/fatigue. Negative for chills, fever and weight loss.  HENT:  Negative for congestion, ear discharge and nosebleeds.   Eyes:  Negative for blurred vision.  Respiratory:  Negative for cough, hemoptysis, sputum production, shortness of breath and wheezing.   Cardiovascular:  Negative for chest pain, palpitations, orthopnea and claudication.  Gastrointestinal:  Negative for abdominal pain, blood in stool, constipation, diarrhea, heartburn, melena, nausea and vomiting.  Genitourinary:  Negative for dysuria, flank pain, frequency, hematuria and urgency.  Musculoskeletal:  Positive for joint pain. Negative for back pain and myalgias.  Skin:  Negative for rash.  Neurological:  Negative for dizziness, tingling, focal weakness, seizures, weakness and headaches.  Endo/Heme/Allergies:  Does not bruise/bleed easily.  Psychiatric/Behavioral:  Negative for depression and suicidal ideas. The patient does not have insomnia.       No Known Allergies   Past Medical History:  Diagnosis Date   Allergy    Anal fissure    Aspiration into airway    CAP (community acquired pneumonia) 08/23/2017   Chest pain    a. 09/04/2018 MV: EF >65%. No ischemia/infarct.   Coronary artery calcification seen on CT scan    Diastolic dysfunction    a. 10/2016 Echo: EF 60-65%, no rwma, Gr1 DD, mild MR, nl RV fxn, mild to mod TR, PASP .   Diverticulosis    Dyspnea    GERD (gastroesophageal reflux disease)  Hemorrhoids    Hyperplastic colon polyp    Interstitial lung disorders    Pulmonary fibrosis    Septicemia 1999   spent 4 weeks in ICU, intubated -? PNA   Vertebral fracture, pathological      Past Surgical History:  Procedure Laterality Date   ABDOMINAL HYSTERECTOMY     FLEXIBLE BRONCHOSCOPY N/A 07/05/2016   Procedure: FLEXIBLE  BRONCHOSCOPY;  Surgeon: Stephanie Acre, MD;  Location: [facility] ORS;  Service: Cardiopulmonary;  Laterality: N/A;   IR RADIOLOGIST EVAL & MGMT  11/05/2021    Social History   Socioeconomic History   Marital status: Married    Spouse name: Not on file   Number of children: 2   Years of education: Not on file   Highest education level: Not on file  Occupational History   Occupation: Retired   Tobacco Use   Smoking status: Former    Packs/day: 0.25    Years: 20.00    Additional pack years: 0.00    Total pack years: 5.00    Types: Cigarettes    Quit date: 01/16/1981    Years since quitting: 42.0   Smokeless tobacco: Never  Vaping Use   Vaping Use: Never used  Substance and Sexual Activity   Alcohol use: Yes    Alcohol/week: 7.0 standard drinks of alcohol    Types: 7 Standard drinks or equivalent per week   Drug use: No   Sexual activity: Not on file  Other Topics Concern   Not on file  Social History Narrative   Married.     2 children.  Lives with her husband in [place].   Social Determinants of Health   Financial Resource Strain: Low Risk  (04/26/2022)   Overall Financial Resource Strain (CARDIA)    Difficulty of Paying Living Expenses: Not hard at all  Food Insecurity: No Food Insecurity (04/26/2022)   Hunger Vital Sign    Worried About Running Out of Food in the Last Year: Never true    Ran Out of Food in the Last Year: Never true  Transportation Needs: No Transportation Needs (04/26/2022)   PRAPARE - Administrator, Civil Service (Medical): No    Lack of Transportation (Non-Medical): No  Physical Activity: Insufficiently Active (04/26/2022)   Exercise Vital Sign    Days of Exercise per Week: 2 days    Minutes of Exercise per Session: 30 min  Stress: No Stress Concern Present (04/26/2022)   [employer] of Occupational Health - Occupational Stress Questionnaire    Feeling of Stress : Not at all  Social Connections: Moderately Isolated (04/26/2022)    Social Connection and Isolation Panel [NHANES]    Frequency of Communication with Friends and Family: More than three times a week    Frequency of Social Gatherings with Friends and Family: Twice a week    Attends Religious Services: More than 4 times per year    Active Member of [employer] or Organizations: No    Attends Banker Meetings: Never    Marital Status: Widowed  Intimate Partner Violence: Unknown (04/26/2022)   Humiliation, Afraid, Rape, and Kick questionnaire    Fear of Current or Ex-Partner: No    Emotionally Abused: No    Physically Abused: No    Sexually Abused: Not on file    Family History  Problem Relation Age of Onset   Parkinson's disease Mother    Colon polyps Mother 29           Ulcers  Father    Rheumatic fever Father    Macular degeneration Sister    Healthy Son    Healthy Daughter      Current Outpatient Medications:    azaTHIOprine (IMURAN) 50 MG tablet, TAKE ONE AND ONE-HALF TABLETS (75 MG) EVERY MORNING AND 1 TABLET (50 MG) EVERY EVENING, Disp: 225 tablet, Rfl: 0   azithromycin (ZITHROMAX) 250 MG tablet, Take 1 tablet 3x a week, Disp: 30 each, Rfl: 3   denosumab (PROLIA) 60 MG/ML SOLN injection, Inject 60 mg into the skin every 6 (six) months. Administer in upper arm, thigh, or abdomen, Disp: , Rfl:    gabapentin (NEURONTIN) 100 MG capsule, Take 2 capsules (200 mg total) by mouth at bedtime., Disp: 180 capsule, Rfl: 1   montelukast (SINGULAIR) 10 MG tablet, Take 1 tablet (10 mg total) by mouth daily., Disp: 90 tablet, Rfl: 0   Multiple Minerals-Vitamins (CALCIUM & VIT D3 BONE HEALTH PO), Take 1 Dose by mouth daily., Disp: , Rfl:    pantoprazole (PROTONIX) 20 MG tablet, Take 1 tablet (20 mg total) by mouth 2 (two) times daily. For heartburn, Disp: 180 tablet, Rfl: 3   predniSONE (DELTASONE) 1 MG tablet, Take 2 tablets (2 mg total) by mouth daily with breakfast., Disp: 180 tablet, Rfl: 0   propranolol (INDERAL) 10 MG tablet, TAKE 1 TABLET TWICE A  DAY FOR TREMOR, Disp: 180 tablet, Rfl: 0   traZODone (DESYREL) 50 MG tablet, Take 1 tablet (50 mg total) by mouth at bedtime as needed for sleep. For sleep., Disp: 90 tablet, Rfl: 3   vitamin B-12 (CYANOCOBALAMIN) 500 MCG tablet, Take 500 mcg by mouth daily., Disp: , Rfl:   Physical exam:  Vitals:   01/05/23 1053  BP: 106/65  Pulse: 71  Resp: 16  Temp: (!) 96.9 F (36.1 C)  TempSrc: Tympanic  SpO2: 100%  Weight: 106 lb 14.4 oz (48.5 kg)  Height: 5\' 8"  (1.727 m)   Physical Exam Constitutional:      Comments: Patient is elderly and frail.  Appears in no acute distress  Cardiovascular:     Rate and Rhythm: Normal rate and regular rhythm.     Heart sounds: Normal heart sounds.  Pulmonary:     Effort: Pulmonary effort is normal.     Breath sounds: Normal breath sounds.  Abdominal:     General: Bowel sounds are normal.     Palpations: Abdomen is soft.  Skin:    General: Skin is warm and dry.  Neurological:     Mental Status: She is alert and oriented to person, place, and time.         Latest Ref Rng & Units 11/24/2022    9:15 AM  CMP  Glucose 70 - 99 mg/dL 77   BUN 6 - 23 mg/dL 27   Creatinine 2.10 - 1.20 mg/dL 3.12   Sodium 811 - 886 mEq/L 138   Potassium 3.5 - 5.1 mEq/L 4.2   Chloride 96 - 112 mEq/L 101   CO2 19 - 32 mEq/L 27   Calcium 8.4 - 10.5 mg/dL 9.7       Latest Ref Rng & Units 01/05/2023   10:40 AM  CBC  WBC 4.0 - 10.5 K/uL 7.5   Hemoglobin 12.0 - 15.0 g/dL 9.6   Hematocrit 77.3 - 46.0 % 29.4   Platelets 150 - 400 K/uL 372     No images are attached to the encounter.  No results found.   Assessment and plan- Patient is  a 89 y.o. female here for routine follow-up of anemia of chronic disease   Patient's baseline hemoglobin was stable around 10 for the last year.  Over the last 6 months it has gradually drifted down to the nines and is presently 9.6.  We have done an extensive peripheral blood anemia workup in the past which did not show any evidence  of a reversible cause.  Given the chronic macrocytosis we could be dealing with an element of MDS.  Her iron studies today did come back with evidence of iron deficiency.  Ferritin levels were low at 24 with an iron saturation of 27%.  I think it would be worthwhile to give her a trial of IV iron.  I will repeat CBC ferritin and iron studies in 3 months in 6 months and see her back in 6 months.  If her hemoglobin drifts down to less than 9 she will require a bone marrow biopsy to rule out MDS.  If she were to have MDS we may have other oral treatment options for her versus trial of EPO as well.  Patient verbalized understanding of the plan.  Discussed risks and benefits of IV iron including all but not limited to possible risk of infusion reaction.  Patient understands and agrees to proceed as planned   Visit Diagnosis 1. Anemia of chronic disease   2. B12 deficiency      Dr. Owens SharkArchana Allessandra Bernardi, MD, MPH [facility] [phone] 01/08/2023 9:56 AM

## 2023-01-12 ENCOUNTER — Ambulatory Visit (INDEPENDENT_AMBULATORY_CARE_PROVIDER_SITE_OTHER): Payer: Medicare Other | Admitting: Pulmonary Disease

## 2023-01-12 ENCOUNTER — Encounter: Payer: Self-pay | Admitting: Pulmonary Disease

## 2023-01-12 VITALS — BP 120/70 | HR 73 | Temp 97.5°F | Ht 68.0 in | Wt 108.2 lb

## 2023-01-12 DIAGNOSIS — K219 Gastro-esophageal reflux disease without esophagitis: Secondary | ICD-10-CM

## 2023-01-12 DIAGNOSIS — J849 Interstitial pulmonary disease, unspecified: Secondary | ICD-10-CM

## 2023-01-12 DIAGNOSIS — R053 Chronic cough: Secondary | ICD-10-CM

## 2023-01-12 DIAGNOSIS — J479 Bronchiectasis, uncomplicated: Secondary | ICD-10-CM

## 2023-01-12 DIAGNOSIS — K224 Dyskinesia of esophagus: Secondary | ICD-10-CM | POA: Diagnosis not present

## 2023-01-12 DIAGNOSIS — R0602 Shortness of breath: Secondary | ICD-10-CM

## 2023-01-12 NOTE — Progress Notes (Signed)
Subjective:    Patient ID: Crystal Gregory, female    DOB: 1932/08/31, 89 y.o.   MRN: [id number] Patient Care Team: Doreene Nestlark, Katherine K, NP as PCP - General (Internal Medicine) Antonieta IbaGollan, Timothy J, MD as PCP - Cardiology (Cardiology) Antonieta IbaGollan, Timothy J, MD as Consulting Physician (Cardiology) Pollyann Savoyeveshwar, Shaili, MD as Consulting Physician (Rheumatology) Salena SanerGonzalez, Sondos Wolfman L, MD as Consulting Physician (Pulmonary Disease)  Chief Complaint  Patient presents with   Follow-up    SOB with exertion. No wheezing. Cough with occasional green sputum.   PROBLEMS: History of prior ARDS 1999 Prior long term nitrofurantoin therapy Pulmonary fibrosis Bronchiectasis Mild COPD Rheumatoid arthritis (RF/CCP positive) Dysphagia with chronic silent aspiration   PT PROFILE: 89 y.o. with remote minimal smoking history (15 PY, quit 1982) initially evaluated by Dr Dema SeverinMungal 03/2016 and subsequently by Dr Marchelle Gearingamaswamy for pulmonary fibrosis. Pt has prior history of severe ARDS (1999) and had been on chronic nitrofurantoin for recurrent UTIs. She has been tried on 2 maintenance inhalers without discernible benefit.   DATA: CT chest 03/22/16: Generalized pulmonary hyperinflation but without bullous emphysema. Widespread bronchiectasis with bronchial wall thickening consistent with inflammatory bronchitis. Areas of pulmonary fibrosis, most pronounced in the lower lobes, where the bronchiectasis is more extensive, including areas of pulmonary lung destruction HRCT chest 06/11/16: Diffuse cylindrical and varicoid bronchiectasis throughout both lungs, most severe at the lung bases. Basilar predominant fibrotic interstitial lung disease with patchy honeycombing, with mild progression in the short interval since 03/22/2016 Spirometry 06/18/16: Mild obstruction, FEV1 1.42 liters (63%), FVC 2.43 (80%) Bronchoscopy 07/05/16: Normal airway exam. BAL negative for AFB Echocardiogram 10/07/15: LVEF 60%, Grade I DD, mild MR, RVSP  estimate 35 mmHg HRCT 11/04/16: again compatible with interstitial lung disease, and considered diagnostic of usual interstitial pneumonia (UIP) from an imaging standpoint. There has been no significant progression of disease compared to the recent prior examination PFT 02/10/17: no obstruction, normal TLC, moderate reduction in DLCO 6MWT 03/11/17: 345 m. No desaturation PFTs 11/22/17: No obstruction.  Lung volumes normal.  DLCO moderately reduced (55% predicted). NSC since 02/10/17 PFTs 11/23/18 : FVC: 2.67 L (95 %pred), FEV1: 2.35 L (112 %pred), FEV1/FVC: 88%, TLC: 4.98 L (88 %pred), DLCO 42 %pred 2D echo 11/16/2019:Left ventricular ejection fraction, by estimation, is 60 to 65%. Left ventricular diastolic parameters were normal.Right ventricular systolic function is normal. The right ventricular size is normal. Mildly elevated pulmonary artery systolic pressure. The estimated right ventricular systolic pressure is 39.4 mmHg. Left atrial size was mildly dilated.Tricuspid valve regurgitation is mild to moderate CT chest 02/15/2020:The appearance of the lungs is compatible with interstitial lung disease, with a spectrum of findings considered diagnostic of usual interstitial pneumonia (UIP) per current ATS guidelines. Minimal progression compared to the prior examination. Barium swallow study 03/05/2020: Tertiary contractions of the esophagus mild spasm, one episode of tracheal aspiration noted without cough reflex, mild GERD. Overnight pulse oximetry 08/18/2020: No evidence of significant nocturnal desaturation. PFTs 08/31/2020: FEV1 2.16 L or 102% predicted, FVC 2.30 L or 80% predicted, FEV1 FVC 94% predicted, no bronchodilator response.  TLC 4.46 L or 78% predicted, DLCO 37% predicted. QuantiFERON gold 03/12/2021: Negative. CT high-res 03/24/2021: Progressively worsening interstitial lung disease likely UIP.  Markedly worsening bronchiectasis in the right lower lobe with acute airspace consolidation.   Pneumonia considered.  Trace partially loculated right pleural effusion. PFTs 03/25/2021: FEV1 1.38 L or 66% predicted, FVC 1.90 L or 67% predicted FEV1/FVC 73% no bronchodilator response.  Air-trapping noted.  Overall worsening on FEV1 from prior  likely related to acute illness diffusion capacity severely reduced. Scleroderma panel 04/01/2021: Negative Chest CT without contrast 05/05/2021: No significant change in pulmonary fibrosis bronchiectasis and areas of honeycombing at the lung bases.  Are consistent with UIP.  The previously noted infiltrate has resolved. PFTs 06/02/2021: FEV1 1.59 L or 76% predicted, FVC 2.08 L or 74% predicted, FEV1/FVC 76%, no bronchodilator response.  Lung volumes normal, improvement from prior PFTs noted.  Diffusion capacity severely reduced however improved from prior. 2D echocardiogram 07/09/2021: LVEF 60 to 65%, grade 2 DD, mildly elevated pulmonary artery systolic pressure, RV systolic pressure 40.9 mmHg mild aortic sclerosis without stenosis.  Stable from prior. Swallow evaluation 07/10/2021: Normal swallow function.  No laryngeal penetration or tracheal aspiration. Chest x-ray PA and lateral 01/14/2022: Findings consistent with chronic pulmonary fibrosis and senescent changes, small right pleural effusion, 10 mm nodular density in the right perihilar region unchanged from 2020 CT chest.   INTERVAL: Last encounter was 11/04/2022, at that time was given a sample of AirSupra however this has not helped any with her shortness of breath towards the end of the day.  Since that visit she has not had any exacerbations.  She feels she is doing well.   HPI Crystal Gregory presents today for a scheduled follow-up of the issues noted above.   She is following antireflux measures carefully. With regards to cough and dyspnea these are at baseline, not worsening.  Her dyspnea has always been worse in the evenings that has not changed.  Cough is actually relatively well controlled since she  started being more careful with antireflux measures.  She has "good days and bad days" with regards to her cough.  She does have some bronchiectasis and at times produces copious amounts of sputum.  She has declined chest physiotherapy device.  Her dyspnea is mostly related to when she "hurries" but not when she is performing activities of daily living and "paces herself". She has not had any further issues with thoracolumbar pain. No recent rheumatoid arthritis flares.  Previously she endorsed having significant gastroesophageal reflux and noticed that at times when she refluxed she would get a sour taste in the back of the throat with "rancid" taste and smell.  She has been evaluated by GI for these issues (last seen by Dr. Erick Blinks in September 2022).  She has esophageal dysmotility.  She has noted that these episodes have now resolved with eating smaller, more frequent meals and taking her time for chewing etc.  She has had no fevers, chills or sweats.  No chest pain.  No lower extremity edema or calf tenderness.  She does not endorse any other new symptomatology today.    Review of Systems A 10 point review of systems was performed and it is as noted above otherwise negative.  Patient Active Problem List   Diagnosis Date Noted   Elevated brain natriuretic peptide (BNP) level 01/29/2022   Edema of right lower extremity 01/29/2022   Diastolic dysfunction with acute on chronic heart failure 01/29/2022   Baker's cyst, right 01/14/2022   Asymptomatic varicose veins of both lower extremities 01/14/2022   DOE (dyspnea on exertion) 01/14/2022   Compression fracture of thoracic spine, non-traumatic 11/26/2021   Tremor of both hands 11/26/2021   B12 deficiency 09/02/2020   Anemia 08/06/2020   TMJ (temporomandibular joint syndrome) 03/02/2018   RA (rheumatoid arthritis) 07/13/2017   Polyarthralgia 04/25/2017   ILD (interstitial lung disease) 06/18/2016   IPF (idiopathic pulmonary fibrosis)  03/23/2016   Bronchiectasis  without complication 03/23/2016   Allergic rhinitis 03/22/2016   Anal fissure 08/18/2015   Insomnia 08/18/2015   Osteoporosis 02/13/2015   Stress due to illness of family member 09/16/2014   Hiatal hernia 10/13/2012   GERD (gastroesophageal reflux disease)    Allergy    Social History   Tobacco Use   Smoking status: Former    Packs/day: 0.25    Years: 20.00    Additional pack years: 0.00    Total pack years: 5.00    Types: Cigarettes    Quit date: 01/16/1981    Years since quitting: 42.0   Smokeless tobacco: Never  Substance Use Topics   Alcohol use: Yes    Alcohol/week: 7.0 standard drinks of alcohol    Types: 7 Standard drinks or equivalent per week   No Known Allergies  Current Meds  Medication Sig   azaTHIOprine (IMURAN) 50 MG tablet TAKE ONE AND ONE-HALF TABLETS (75 MG) EVERY MORNING AND 1 TABLET (50 MG) EVERY EVENING   azithromycin (ZITHROMAX) 250 MG tablet Take 1 tablet 3x a week   denosumab (PROLIA) 60 MG/ML SOLN injection Inject 60 mg into the skin every 6 (six) months. Administer in upper arm, thigh, or abdomen   gabapentin (NEURONTIN) 100 MG capsule Take 2 capsules (200 mg total) by mouth at bedtime.   montelukast (SINGULAIR) 10 MG tablet Take 1 tablet (10 mg total) by mouth daily.   Multiple Minerals-Vitamins (CALCIUM & VIT D3 BONE HEALTH PO) Take 1 Dose by mouth daily.   pantoprazole (PROTONIX) 20 MG tablet Take 1 tablet (20 mg total) by mouth 2 (two) times daily. For heartburn   predniSONE (DELTASONE) 1 MG tablet Take 2 tablets (2 mg total) by mouth daily with breakfast.   propranolol (INDERAL) 10 MG tablet TAKE 1 TABLET TWICE A DAY FOR TREMOR   traZODone (DESYREL) 50 MG tablet Take 1 tablet (50 mg total) by mouth at bedtime as needed for sleep. For sleep.   vitamin B-12 (CYANOCOBALAMIN) 500 MCG tablet Take 500 mcg by mouth daily.   Immunization History  Administered Date(s) Administered   Fluad Quad(high Dose 65+) 07/24/2019,  07/04/2022   Influenza Split 06/04/2012   Influenza, High Dose Seasonal PF 09/10/2016   Influenza,inj,Quad PF,6+ Mos 07/04/2013, 08/18/2015, 07/13/2017, 06/19/2018   Influenza-Unspecified 07/15/2020   Moderna SARS-COV2 Booster Vaccination 05/19/2020, 02/19/2021   Moderna Sars-Covid-2 Vaccination 10/22/2019, 11/19/2019, 04/02/2022   Pneumococcal Conjugate-13 10/06/2011   Pneumococcal Polysaccharide-23 06/19/2018   Zoster, Live 01/14/2007       Objective:   Physical Exam BP 120/70 (BP Location: Left Arm, Cuff Size: Normal)   Pulse 73   Temp (!) 97.5 F (36.4 C)   Ht  (1.727 m)   Wt 108 lb 3.2 oz (49.1 kg)   SpO2 99%   BMI 16.45 kg/m   SpO2: 99 % O2 Device: None (Room air)  GENERAL: Chronically ill-appearing, thin, well-developed woman in no acute distress.  Fully ambulatory.  She is actually quite spry.  She is chronically pale. HEAD: Normocephalic, atraumatic. EYES: Pupils equal, round, reactive to light.  No scleral icterus. MOUTH: Dentition intact, some teeth in poor repair.  Oral mucosa moist.  No thrush. NECK: Supple. No thyromegaly. Trachea midline. No JVD.  No adenopathy. PULMONARY: Good air entry bilaterally.  Coarse breath sounds at the bases,crackles on the right base, otherwise no adventitious sounds.   CARDIOVASCULAR: S1 and S2. Regular rate and rhythm.  No rubs, murmurs or gallops heard.   ABDOMEN: Nondistended, scaphoid.   MUSCULOSKELETAL:  Mild rheumatoid arthritis changes from hands, no active synovitis, no clubbing, no edema. NEUROLOGIC: No overt focal deficit, no gait disturbance, speech is fluent. SKIN: Intact,warm,dry. PSYCH: Mood and behavior are normal      Assessment & Plan:     ICD-10-CM   1. ILD (interstitial lung disease)  J84.9    Appears well compensated Multifactorial ARDS/RA/Macrodantin use/chronic silent aspiration    2. Bronchiectasis without complication  J47.9    No recent evidence of flare No change in symptoms    3. Chronic  cough  R05.3    No worsening Mostly related to reflux/GERD    4. Esophageal dysmotility  K22.4    This issue adds complexity to her management Esophageal dysmotility/patulous esophagus Patient following strict diet/antireflux measures    5. Gastroesophageal reflux disease, unspecified whether esophagitis present  K21.9    Continue Protonix    6. Shortness of breath  R06.02    No change from baseline     Crystal Gregory appears to be fairly well compensated.  She is to continue current regimen.  We will see her in follow-up in 3 months time she is to contact us prior to that time should any new issues arise.  Gailen Shelter, MD Advanced Bronchoscopy PCCM [facility]    *This note was dictated using voice recognition software/Dragon.  Despite best efforts to proofread, errors can occur which can change the meaning. Any transcriptional errors that result from this process are unintentional and may not be fully corrected at the time of dictation.

## 2023-01-12 NOTE — Patient Instructions (Addendum)
Your lungs sounded good today.  We will see you in follow-up in 3 months time call sooner should any new problems arise.

## 2023-01-13 ENCOUNTER — Telehealth: Payer: Self-pay | Admitting: *Deleted

## 2023-01-13 NOTE — Telephone Encounter (Signed)
Pt called back and said she would like the iron infusion. She states that she will be out of town mid may. I told her that I feel that you will get it before that times come. I have asked Megan to schedule her 2 appts for IC feraheme and call pt with dates and times. She will do it

## 2023-01-13 NOTE — Telephone Encounter (Signed)
Called pt and got voicemail. I left message that pt. Does have iron defiiency and suggests to get IV iron if pt. Agreeable to that.we will check with insurance and see what IRon they give you. Left message to callback and left my direct number.

## 2023-01-17 ENCOUNTER — Other Ambulatory Visit: Payer: Self-pay | Admitting: Oncology

## 2023-01-18 ENCOUNTER — Inpatient Hospital Stay: Payer: Medicare Other

## 2023-01-18 VITALS — BP 114/55 | HR 75 | Temp 97.9°F | Resp 16

## 2023-01-18 DIAGNOSIS — M069 Rheumatoid arthritis, unspecified: Secondary | ICD-10-CM | POA: Diagnosis not present

## 2023-01-18 DIAGNOSIS — D638 Anemia in other chronic diseases classified elsewhere: Secondary | ICD-10-CM | POA: Diagnosis not present

## 2023-01-18 DIAGNOSIS — E538 Deficiency of other specified B group vitamins: Secondary | ICD-10-CM | POA: Diagnosis not present

## 2023-01-18 DIAGNOSIS — K219 Gastro-esophageal reflux disease without esophagitis: Secondary | ICD-10-CM | POA: Diagnosis not present

## 2023-01-18 DIAGNOSIS — R5383 Other fatigue: Secondary | ICD-10-CM | POA: Diagnosis not present

## 2023-01-18 DIAGNOSIS — M2550 Pain in unspecified joint: Secondary | ICD-10-CM | POA: Diagnosis not present

## 2023-01-18 MED ORDER — SODIUM CHLORIDE 0.9 % IV SOLN
510.0000 mg | INTRAVENOUS | Status: DC
  Administered 2023-01-18: 510 mg via INTRAVENOUS
  Filled 2023-01-18: qty 510

## 2023-01-18 MED ORDER — SODIUM CHLORIDE 0.9 % IV SOLN
Freq: Once | INTRAVENOUS | Status: AC
  Filled 2023-01-18: qty 250

## 2023-01-25 ENCOUNTER — Inpatient Hospital Stay: Payer: Medicare Other

## 2023-01-25 VITALS — BP 118/53 | HR 82 | Temp 98.0°F | Resp 18

## 2023-01-25 DIAGNOSIS — M2550 Pain in unspecified joint: Secondary | ICD-10-CM | POA: Diagnosis not present

## 2023-01-25 DIAGNOSIS — K219 Gastro-esophageal reflux disease without esophagitis: Secondary | ICD-10-CM | POA: Diagnosis not present

## 2023-01-25 DIAGNOSIS — M069 Rheumatoid arthritis, unspecified: Secondary | ICD-10-CM | POA: Diagnosis not present

## 2023-01-25 DIAGNOSIS — G250 Essential tremor: Secondary | ICD-10-CM

## 2023-01-25 DIAGNOSIS — E538 Deficiency of other specified B group vitamins: Secondary | ICD-10-CM

## 2023-01-25 DIAGNOSIS — D638 Anemia in other chronic diseases classified elsewhere: Secondary | ICD-10-CM | POA: Diagnosis not present

## 2023-01-25 DIAGNOSIS — R5383 Other fatigue: Secondary | ICD-10-CM | POA: Diagnosis not present

## 2023-01-25 MED ORDER — SODIUM CHLORIDE 0.9 % IV SOLN
510.0000 mg | INTRAVENOUS | Status: DC
  Administered 2023-01-25: 510 mg via INTRAVENOUS
  Filled 2023-01-25: qty 510

## 2023-01-25 MED ORDER — PROPRANOLOL HCL 10 MG PO TABS
ORAL_TABLET | ORAL | 2 refills | Status: DC
Start: 2023-01-25 — End: 2023-11-16

## 2023-01-25 MED ORDER — SODIUM CHLORIDE 0.9 % IV SOLN
Freq: Once | INTRAVENOUS | Status: AC
  Filled 2023-01-25: qty 250

## 2023-01-25 NOTE — Patient Instructions (Signed)

## 2023-01-26 DIAGNOSIS — M47812 Spondylosis without myelopathy or radiculopathy, cervical region: Secondary | ICD-10-CM | POA: Diagnosis not present

## 2023-01-26 DIAGNOSIS — M059 Rheumatoid arthritis with rheumatoid factor, unspecified: Secondary | ICD-10-CM | POA: Diagnosis not present

## 2023-01-26 DIAGNOSIS — Z7960 Long term (current) use of unspecified immunomodulators and immunosuppressants: Secondary | ICD-10-CM | POA: Diagnosis not present

## 2023-01-26 DIAGNOSIS — J849 Interstitial pulmonary disease, unspecified: Secondary | ICD-10-CM | POA: Diagnosis not present

## 2023-02-03 DIAGNOSIS — J849 Interstitial pulmonary disease, unspecified: Secondary | ICD-10-CM | POA: Diagnosis not present

## 2023-02-03 DIAGNOSIS — M059 Rheumatoid arthritis with rheumatoid factor, unspecified: Secondary | ICD-10-CM | POA: Diagnosis not present

## 2023-02-03 DIAGNOSIS — Z79899 Other long term (current) drug therapy: Secondary | ICD-10-CM | POA: Diagnosis not present

## 2023-02-24 DIAGNOSIS — M0579 Rheumatoid arthritis with rheumatoid factor of multiple sites without organ or systems involvement: Secondary | ICD-10-CM | POA: Diagnosis not present

## 2023-03-03 DIAGNOSIS — K219 Gastro-esophageal reflux disease without esophagitis: Secondary | ICD-10-CM

## 2023-03-03 MED ORDER — PANTOPRAZOLE SODIUM 20 MG PO TBEC
20.0000 mg | DELAYED_RELEASE_TABLET | Freq: Two times a day (BID) | ORAL | 2 refills | Status: DC
Start: 2023-03-03 — End: 2023-11-04

## 2023-03-10 DIAGNOSIS — M0579 Rheumatoid arthritis with rheumatoid factor of multiple sites without organ or systems involvement: Secondary | ICD-10-CM | POA: Diagnosis not present

## 2023-03-23 ENCOUNTER — Encounter: Payer: Self-pay | Admitting: Pulmonary Disease

## 2023-03-23 MED ORDER — MONTELUKAST SODIUM 10 MG PO TABS: 10.0000 mg | ORAL_TABLET | Freq: Every day | ORAL | 1 refills | Status: DC

## 2023-03-24 DIAGNOSIS — M0579 Rheumatoid arthritis with rheumatoid factor of multiple sites without organ or systems involvement: Secondary | ICD-10-CM | POA: Diagnosis not present

## 2023-04-06 ENCOUNTER — Inpatient Hospital Stay: Payer: Medicare Other | Attending: Oncology

## 2023-04-06 DIAGNOSIS — D638 Anemia in other chronic diseases classified elsewhere: Secondary | ICD-10-CM | POA: Diagnosis not present

## 2023-04-06 DIAGNOSIS — E538 Deficiency of other specified B group vitamins: Secondary | ICD-10-CM | POA: Diagnosis not present

## 2023-04-06 LAB — CBC WITH DIFFERENTIAL/PLATELET
Abs Immature Granulocytes: 0.04 10*3/uL (ref 0.00–0.07)
Basophils Absolute: 0 10*3/uL (ref 0.0–0.1)
Basophils Relative: 1 %
Eosinophils Absolute: 0 10*3/uL (ref 0.0–0.5)
Eosinophils Relative: 1 %
HCT: 33.6 % — ABNORMAL LOW (ref 36.0–46.0)
Hemoglobin: 11.3 g/dL — ABNORMAL LOW (ref 12.0–15.0)
Immature Granulocytes: 1 %
Lymphocytes Relative: 23 %
Lymphs Abs: 1.5 10*3/uL (ref 0.7–4.0)
MCH: 37.9 pg — ABNORMAL HIGH (ref 26.0–34.0)
MCHC: 33.6 g/dL (ref 30.0–36.0)
MCV: 112.8 fL — ABNORMAL HIGH (ref 80.0–100.0)
Monocytes Absolute: 0.5 10*3/uL (ref 0.1–1.0)
Monocytes Relative: 7 %
Neutro Abs: 4.2 10*3/uL (ref 1.7–7.7)
Neutrophils Relative %: 67 %
Platelets: 291 10*3/uL (ref 150–400)
RBC: 2.98 MIL/uL — ABNORMAL LOW (ref 3.87–5.11)
RDW: 14.6 % (ref 11.5–15.5)
WBC: 6.2 10*3/uL (ref 4.0–10.5)
nRBC: 0 % (ref 0.0–0.2)

## 2023-04-06 LAB — FERRITIN: Ferritin: 481 ng/mL — ABNORMAL HIGH (ref 11–307)

## 2023-04-06 LAB — IRON AND TIBC
Iron: 60 ug/dL (ref 28–170)
Saturation Ratios: 26 % (ref 10.4–31.8)
TIBC: 228 ug/dL — ABNORMAL LOW (ref 250–450)
UIBC: 168 ug/dL

## 2023-04-14 DIAGNOSIS — Z79899 Other long term (current) drug therapy: Secondary | ICD-10-CM | POA: Diagnosis not present

## 2023-04-14 DIAGNOSIS — M0579 Rheumatoid arthritis with rheumatoid factor of multiple sites without organ or systems involvement: Secondary | ICD-10-CM | POA: Diagnosis not present

## 2023-04-14 DIAGNOSIS — J849 Interstitial pulmonary disease, unspecified: Secondary | ICD-10-CM | POA: Diagnosis not present

## 2023-04-21 DIAGNOSIS — M0579 Rheumatoid arthritis with rheumatoid factor of multiple sites without organ or systems involvement: Secondary | ICD-10-CM | POA: Diagnosis not present

## 2023-04-27 ENCOUNTER — Ambulatory Visit (INDEPENDENT_AMBULATORY_CARE_PROVIDER_SITE_OTHER): Payer: Medicare Other | Admitting: Pulmonary Disease

## 2023-04-27 ENCOUNTER — Encounter: Payer: Self-pay | Admitting: Pulmonary Disease

## 2023-04-27 VITALS — BP 110/70 | HR 85 | Temp 97.1°F | Ht 68.0 in | Wt 108.2 lb

## 2023-04-27 DIAGNOSIS — J849 Interstitial pulmonary disease, unspecified: Secondary | ICD-10-CM | POA: Diagnosis not present

## 2023-04-27 DIAGNOSIS — K219 Gastro-esophageal reflux disease without esophagitis: Secondary | ICD-10-CM

## 2023-04-27 DIAGNOSIS — M0579 Rheumatoid arthritis with rheumatoid factor of multiple sites without organ or systems involvement: Secondary | ICD-10-CM

## 2023-04-27 DIAGNOSIS — J479 Bronchiectasis, uncomplicated: Secondary | ICD-10-CM

## 2023-04-27 DIAGNOSIS — R0602 Shortness of breath: Secondary | ICD-10-CM | POA: Diagnosis not present

## 2023-04-27 DIAGNOSIS — R053 Chronic cough: Secondary | ICD-10-CM

## 2023-04-27 DIAGNOSIS — K224 Dyskinesia of esophagus: Secondary | ICD-10-CM | POA: Diagnosis not present

## 2023-04-27 NOTE — Progress Notes (Signed)
Subjective:    Patient ID: Crystal Gregory, female    DOB: 1931/12/12, 89 y.o.   MRN: [id number]  Patient Care Team: Doreene Nest, NP as PCP - General (Internal Medicine) Mariah Milling Tollie Pizza, MD as PCP - Cardiology (Cardiology) Antonieta Iba, MD as Consulting Physician (Cardiology) Pollyann Savoy, MD as Consulting Physician (Rheumatology) Salena Saner, MD as Consulting Physician (Pulmonary Disease)  PROBLEMS: History of prior ARDS 1999 Prior long term nitrofurantoin therapy Pulmonary fibrosis Bronchiectasis Mild COPD Rheumatoid arthritis (RF/CCP positive) Dysphagia with chronic silent aspiration   PT PROFILE: 89 y.o. with remote minimal smoking history (15 PY, quit 1982) initially evaluated by Dr Dema Severin 03/2016 and subsequently by Dr Marchelle Gearing for pulmonary fibrosis. Pt has prior history of severe ARDS (1999) and had been on chronic nitrofurantoin for recurrent UTIs. She has been tried on 2 maintenance inhalers without discernible benefit.   DATA: CT chest 03/22/16: Generalized pulmonary hyperinflation but without bullous emphysema. Widespread bronchiectasis with bronchial wall thickening consistent with inflammatory bronchitis. Areas of pulmonary fibrosis, most pronounced in the lower lobes, where the bronchiectasis is more extensive, including areas of pulmonary lung destruction HRCT chest 06/11/16: Diffuse cylindrical and varicoid bronchiectasis throughout both lungs, most severe at the lung bases. Basilar predominant fibrotic interstitial lung disease with patchy honeycombing, with mild progression in the short interval since 03/22/2016 Spirometry 06/18/16: Mild obstruction, FEV1 1.42 liters (63%), FVC 2.43 (80%) Bronchoscopy 07/05/16: Normal airway exam. BAL negative for AFB Echocardiogram 10/07/15: LVEF 60%, Grade I DD, mild MR, RVSP estimate 35 mmHg HRCT 11/04/16: again compatible with interstitial lung disease, and considered diagnostic of usual interstitial  pneumonia (UIP) from an imaging standpoint. There has been no significant progression of disease compared to the recent prior examination PFT 02/10/17: no obstruction, normal TLC, moderate reduction in DLCO 03/11/17: 345 m. No desaturation PFTs 11/22/17: No obstruction.  Lung volumes normal.  DLCO moderately reduced (55% predicted). NSC since 02/10/17 PFTs 11/23/18 : FVC: 2.67 L (95 %pred), FEV1: 2.35 L (112 %pred), FEV1/FVC: 88%, TLC: 4.98 L (88 %pred), DLCO 42 %pred 2D echo 11/16/2019:Left ventricular ejection fraction, by estimation, is 60 to 65%. Left ventricular diastolic parameters were normal.Right ventricular systolic function is normal. The right ventricular size is normal. Mildly elevated pulmonary artery systolic pressure. The estimated right ventricular systolic pressure is 39.4 mmHg. Left atrial size was mildly dilated.Tricuspid valve regurgitation is mild to moderate CT chest 02/15/2020:The appearance of the lungs is compatible with interstitial lung disease, with a spectrum of findings considered diagnostic of usual interstitial pneumonia (UIP) per current ATS guidelines. Minimal progression compared to the prior examination. Barium swallow study 03/05/2020: Tertiary contractions of the esophagus mild spasm, one episode of tracheal aspiration noted without cough reflex, mild GERD. Overnight pulse oximetry 08/18/2020: No evidence of significant nocturnal desaturation. PFTs 08/31/2020: FEV1 2.16 L or 102% predicted, FVC 2.30 L or 80% predicted, FEV1 FVC 94% predicted, no bronchodilator response.  TLC 4.46 L or 78% predicted, DLCO 37% predicted. QuantiFERON gold 03/12/2021: Negative. CT high-res 03/24/2021: Progressively worsening interstitial lung disease likely UIP.  Markedly worsening bronchiectasis in the right lower lobe with acute airspace consolidation.  Pneumonia considered.  Trace partially loculated right pleural effusion. PFTs 03/25/2021: FEV1 1.38 L or 66% predicted, FVC 1.90  L or 67% predicted FEV1/FVC 73% no bronchodilator response.  Air-trapping noted.  Overall worsening on FEV1 from prior likely related to acute illness diffusion capacity severely reduced. Scleroderma panel 04/01/2021: Negative Chest CT without contrast 05/05/2021: No significant change in pulmonary  fibrosis bronchiectasis and areas of honeycombing at the lung bases.  Are consistent with UIP.  The previously noted infiltrate has resolved. PFTs 06/02/2021: FEV1 1.59 L or 76% predicted, FVC 2.08 L or 74% predicted, FEV1/FVC 76%, no bronchodilator response.  Lung volumes normal, improvement from prior PFTs noted.  Diffusion capacity severely reduced however improved from prior. 2D echocardiogram 07/09/2021: LVEF 60 to 65%, grade 2 DD, mildly elevated pulmonary artery systolic pressure, RV systolic pressure 40.9 mmHg mild aortic sclerosis without stenosis.  Stable from prior. Swallow evaluation 07/10/2021: Normal swallow function.  No laryngeal penetration or tracheal aspiration. Chest x-ray PA and lateral 01/14/2022: Findings consistent with chronic pulmonary fibrosis and senescent changes, small right pleural effusion, 10 mm nodular density in the right perihilar region unchanged from 2020 CT chest.   INTERVAL: Last encounter was 01/12/2023, at that time she was instructed to continue Protonix and antireflux measures. Since that visit she has not had any exacerbations.  Since last visit started on Orencia infusion by rheumatology.   Chief Complaint  Patient presents with   Follow-up    DOE. No wheezing. Cough with greenish sputum.    HPI Crystal Gregory presents today for a scheduled follow-up of the issues noted above.   She is following antireflux measures carefully. With regards to cough and dyspnea these are at baseline, not worsening.  Her dyspnea has always been worse in the evenings she actually has not been bothered by this too much lately.  Cough is actually relatively well controlled since she  started being more careful with antireflux measures.  She has "good days and bad days" with regards to her cough but here recently fairly well-controlled.  She does have some bronchiectasis and at times produces copious amounts of sputum.  She has declined chest physiotherapy device. Her dyspnea is mostly related to when she "hurries" but not when she is performing activities of daily living and "paces herself".  She has been having significant issues with her joints with regards to her rheumatoid arthritis and was started on Orencia infusions.  She notes that she has severe fatigue with the Orencia infusions and is not sure if she is going to be able to continue these.  However she does note that her joint pain is improved on the Orencia infusions.  I encouraged her to speak with her rheumatologist with regards to the symptoms.  Previously she endorsed having significant gastroesophageal reflux and noticed that at times when she refluxed she would get a sour taste in the back of the throat with "rancid" taste and smell.  She has been evaluated by GI for these issues (last seen by Dr. Erick Blinks in September 2022).  She has esophageal dysmotility.  She has noted that these episodes have now resolved with eating smaller, more frequent meals and taking her time for chewing etc. Protonix is very helpful as well.  She follows strict antireflux measures.  She has had no fevers, chills or sweats.  No chest pain.  No lower extremity edema or calf tenderness.  She does not endorse any other new symptomatology today.     Review of Systems A 10 point review of systems was performed and it is as noted above otherwise negative.   Patient Active Problem List   Diagnosis Date Noted   Elevated brain natriuretic peptide (BNP) level 01/29/2022   Edema of right lower extremity 01/29/2022   Diastolic dysfunction with acute on chronic heart failure (HCC) 01/29/2022   Baker's cyst, right 01/14/2022  Asymptomatic varicose  veins of both lower extremities 01/14/2022   DOE (dyspnea on exertion) 01/14/2022   Compression fracture of thoracic spine, non-traumatic (HCC) 11/26/2021   Tremor of both hands 11/26/2021   B12 deficiency 09/02/2020   Anemia 08/06/2020   TMJ (temporomandibular joint syndrome) 03/02/2018   RA (rheumatoid arthritis) (HCC) 07/13/2017   Polyarthralgia 04/25/2017   ILD (interstitial lung disease) (HCC) 06/18/2016   IPF (idiopathic pulmonary fibrosis) (HCC) 03/23/2016   Bronchiectasis without complication (HCC) 03/23/2016   Allergic rhinitis 03/22/2016   Anal fissure 08/18/2015   Insomnia 08/18/2015   Osteoporosis 02/13/2015   Stress due to illness of family member 09/16/2014   Hiatal hernia 10/13/2012   GERD (gastroesophageal reflux disease)    Allergy     Social History   Tobacco Use   Smoking status: Former    Current packs/day: 0.00    Average packs/day: 0.3 packs/day for 20.0 years (5.0 ttl pk-yrs)    Types: Cigarettes    Start date: 01/16/1961    Quit date: 01/16/1981    Years since quitting: 42.3   Smokeless tobacco: Never  Substance Use Topics   Alcohol use: Yes    Alcohol/week: 7.0 standard drinks of alcohol    Types: 7 Standard drinks or equivalent per week    No Known Allergies  Current Meds  Medication Sig   azaTHIOprine (IMURAN) 50 MG tablet TAKE ONE AND ONE-HALF TABLETS (75 MG) EVERY MORNING AND 1 TABLET (50 MG) EVERY EVENING   azithromycin (ZITHROMAX) 250 MG tablet Take 1 tablet 3x a week   cyclobenzaprine (FLEXERIL) 5 MG tablet Take 5 mg by mouth at bedtime.   denosumab (PROLIA) 60 MG/ML SOLN injection Inject 60 mg into the skin every 6 (six) months. Administer in upper arm, thigh, or abdomen   gabapentin (NEURONTIN) 100 MG capsule Take 2 capsules (200 mg total) by mouth at bedtime.   montelukast (SINGULAIR) 10 MG tablet Take 1 tablet (10 mg total) by mouth daily.   Multiple Minerals-Vitamins (CALCIUM & VIT D3 BONE HEALTH PO) Take 1 Dose by mouth daily.    pantoprazole (PROTONIX) 20 MG tablet Take 1 tablet (20 mg total) by mouth 2 (two) times daily. For heartburn   predniSONE (DELTASONE) 1 MG tablet Take 2 tablets (2 mg total) by mouth daily with breakfast.   propranolol (INDERAL) 10 MG tablet TAKE 1 TABLET TWICE A DAY FOR TREMOR   traZODone (DESYREL) 50 MG tablet Take 1 tablet (50 mg total) by mouth at bedtime as needed for sleep. For sleep.   vitamin B-12 (CYANOCOBALAMIN) 500 MCG tablet Take 500 mcg by mouth daily.    Immunization History  Administered Date(s) Administered   Fluad Quad(high Dose 65+) 07/24/2019, 07/04/2022   Influenza Split 06/04/2012   Influenza, High Dose Seasonal PF 09/10/2016   Influenza,inj,Quad PF,6+ Mos 07/04/2013, 08/18/2015, 07/13/2017, 06/19/2018   Influenza-Unspecified 07/15/2020   Moderna SARS-COV2 Booster Vaccination 05/19/2020, 02/19/2021   Moderna Sars-Covid-2 Vaccination 10/22/2019, 11/19/2019, 04/02/2022   Pneumococcal Conjugate-13 10/06/2011   Pneumococcal Polysaccharide-23 06/19/2018   Zoster, Live 01/14/2007        Objective:     BP 110/70 (BP Location: Left Arm, Cuff Size: Normal)   Pulse 85   Temp (!) 97.1 F (36.2 C)   Ht 5\' 8"  (1.727 m)   Wt 108 lb 3.2 oz (49.1 kg)   SpO2 99%   BMI 16.45 kg/m   SpO2: 99 % O2 Device: None (Room air)  GENERAL: Chronically ill-appearing, thin, well-developed woman in no acute distress.  Fully ambulatory.  She is actually quite spry.  She is chronically pale. HEAD: Normocephalic, atraumatic. EYES: Pupils equal, round, reactive to light.  No scleral icterus. MOUTH: Dentition intact, some teeth in poor repair.  Oral mucosa moist.  No thrush. NECK: Supple. No thyromegaly. Trachea midline. No JVD.  No adenopathy. PULMONARY: Good air entry bilaterally.  Coarse breath sounds at the bases,crackles on the right base, otherwise no adventitious sounds.   CARDIOVASCULAR: S1 and S2. Regular rate and rhythm.  No rubs, murmurs or gallops heard.   ABDOMEN:  Nondistended, scaphoid.   MUSCULOSKELETAL: Mild rheumatoid arthritis changes from hands, no active synovitis, no clubbing, no edema. NEUROLOGIC: No overt focal deficit, no gait disturbance, speech is fluent. SKIN: Intact,warm,dry. PSYCH: Mood and behavior are normal.       Assessment & Plan:     ICD-10-CM   1. ILD (interstitial lung disease) (HCC)  J84.9    Well compensated Multifactorial: ARDS/RA/Macrodantin use in past/chronic silent aspiration    2. Bronchiectasis without complication (HCC)  J47.9    No recent flare Symptoms well controlled    3. Chronic cough  R05.3    Reasonably well-controlled Mostly related to esophageal dysmotility/GERD    4. Esophageal dysmotility  K22.4    Following antireflux measures    5. Gastroesophageal reflux disease, unspecified whether esophagitis present  K21.9    Antireflux measures PPI Controlled    6. Shortness of breath  R06.02    At baseline    7. Rheumatoid arthritis involving multiple sites with positive rheumatoid factor (HCC)  M05.79    Recent addition of Orencia infusions Patient with fatigue due to the same Recommend discuss with rheumatology      C. Danice Goltz, MD Advanced Bronchoscopy [facility]    *This note was dictated using voice recognition software/Dragon.  Despite best efforts to proofread, errors can occur which can change the meaning. Any transcriptional errors that result from this process are unintentional and may not be fully corrected at the time of dictation.

## 2023-04-27 NOTE — Patient Instructions (Signed)
It was good seeing you today.  Your lungs sounded good.  Please let Dr. Allena Katz know the issues you are having with the Orencia infusion.  You may need to speak to his nurse and leave a message.  We will see you in follow-up in 3 to 4 months time call sooner should any new problems arise.

## 2023-04-28 ENCOUNTER — Ambulatory Visit (INDEPENDENT_AMBULATORY_CARE_PROVIDER_SITE_OTHER): Payer: Medicare Other

## 2023-04-28 VITALS — Ht 68.0 in | Wt 108.0 lb

## 2023-04-28 DIAGNOSIS — Z0000 Encounter for general adult medical examination without abnormal findings: Secondary | ICD-10-CM

## 2023-04-28 NOTE — Progress Notes (Signed)
Subjective:   Crystal Gregory is a 89 y.o. female who presents for Medicare Annual (Subsequent) preventive examination.  Visit Complete: Virtual  I connected with  Crystal Gregory on 04/28/23 by a audio enabled telemedicine application and verified that I am speaking with the correct person using two identifiers.  Patient Location: Home  Provider Location: Office/Clinic  I discussed the limitations of evaluation and management by telemedicine. The patient expressed understanding and agreed to proceed.   Review of Systems      Cardiac Risk Factors include: advanced age (>85men, >33 women)     Objective:    Today's Vitals   04/28/23 0918  Weight: 108 lb (49 kg)  Height: 5\' 8"  (1.727 m)  PainSc: 3    Body mass index is 16.42 kg/m.     04/28/2023    9:27 AM 01/25/2023    1:00 PM 04/26/2022    8:23 AM 01/04/2022   10:05 AM 07/02/2021   10:16 AM 12/30/2020    9:42 AM 08/11/2020   11:05 AM  Advanced Directives  Does Patient Have a Medical Advance Directive? Yes Yes No No Yes Yes No  Type of Estate agent of [place];Living will Living will   Healthcare Power of [place];Living will Healthcare Power of [place];Living will   Does patient want to make changes to medical advance directive?  No - Patient declined   No - Patient declined No - Patient declined   Copy of Healthcare Power of Attorney in Chart? No - copy requested    No - copy requested No - copy requested   Would patient like information on creating a medical advance directive?  No - Patient declined No - Patient declined No - Patient declined       Current Medications (verified) Outpatient Encounter Medications as of 04/28/2023  Medication Sig   azaTHIOprine (IMURAN) 50 MG tablet TAKE ONE AND ONE-HALF TABLETS (75 MG) EVERY MORNING AND 1 TABLET (50 MG) EVERY EVENING   azithromycin (ZITHROMAX) 250 MG tablet Take 1 tablet 3x a week   cyclobenzaprine (FLEXERIL) 5 MG tablet Take 5 mg by mouth at bedtime.    denosumab (PROLIA) 60 MG/ML SOLN injection Inject 60 mg into the skin every 6 (six) months. Administer in upper arm, thigh, or abdomen   gabapentin (NEURONTIN) 100 MG capsule Take 2 capsules (200 mg total) by mouth at bedtime.   montelukast (SINGULAIR) 10 MG tablet Take 1 tablet (10 mg total) by mouth daily.   Multiple Minerals-Vitamins (CALCIUM & VIT D3 BONE HEALTH PO) Take 1 Dose by mouth daily.   pantoprazole (PROTONIX) 20 MG tablet Take 1 tablet (20 mg total) by mouth 2 (two) times daily. For heartburn   predniSONE (DELTASONE) 1 MG tablet Take 2 tablets (2 mg total) by mouth daily with breakfast.   propranolol (INDERAL) 10 MG tablet TAKE 1 TABLET TWICE A DAY FOR TREMOR   traZODone (DESYREL) 50 MG tablet Take 1 tablet (50 mg total) by mouth at bedtime as needed for sleep. For sleep.   vitamin B-12 (CYANOCOBALAMIN) 500 MCG tablet Take 500 mcg by mouth daily.   No facility-administered encounter medications on file as of 04/28/2023.    Allergies (verified) Patient has no known allergies.   History: Past Medical History:  Diagnosis Date   Allergy    Anal fissure    Aspiration into airway    CAP (community acquired pneumonia) 08/23/2017   Chest pain    a. 09/04/2018 MV: EF >65%. No ischemia/infarct.  Coronary artery calcification seen on CT scan    Diastolic dysfunction    a. 10/2016 Echo: EF 60-65%, no rwma, Gr1 DD, mild MR, nl RV fxn, mild to mod TR, PASP .   Diverticulosis    Dyspnea    GERD (gastroesophageal reflux disease)    Hemorrhoids    Hyperplastic colon polyp    Interstitial lung disorders (HCC)    Pulmonary fibrosis (HCC)    Septicemia (HCC) 1999   spent 4 weeks in ICU, intubated -? PNA   Vertebral fracture, pathological    Past Surgical History:  Procedure Laterality Date   ABDOMINAL HYSTERECTOMY     FLEXIBLE BRONCHOSCOPY N/A 07/05/2016   Procedure: FLEXIBLE BRONCHOSCOPY;  Surgeon: Stephanie Acre, MD;  Location: [facility] ORS;  Service: Cardiopulmonary;   Laterality: N/A;   IR RADIOLOGIST EVAL & MGMT  11/05/2021   Family History  Problem Relation Age of Onset   Parkinson's disease Mother    Colon polyps Mother 56           Ulcers Father    Rheumatic fever Father    Macular degeneration Sister    Healthy Son    Healthy Daughter    Social History   Socioeconomic History   Marital status: Widowed    Spouse name: Not on file   Number of children: 2   Years of education: Not on file   Highest education level: Not on file  Occupational History   Occupation: Retired   Tobacco Use   Smoking status: Former    Current packs/day: 0.00    Average packs/day: 0.3 packs/day for 20.0 years (5.0 ttl pk-yrs)    Types: Cigarettes    Start date: 01/16/1961    Quit date: 01/16/1981    Years since quitting: 42.3   Smokeless tobacco: Never  Vaping Use   Vaping status: Never Used  Substance and Sexual Activity   Alcohol use: Yes    Alcohol/week: 7.0 standard drinks of alcohol    Types: 7 Standard drinks or equivalent per week   Drug use: No   Sexual activity: Not on file  Other Topics Concern   Not on file  Social History Narrative   Married.     2 children.  Lives with her husband in [place].   Social Determinants of Health   Financial Resource Strain: Low Risk  (04/28/2023)   Overall Financial Resource Strain (CARDIA)    Difficulty of Paying Living Expenses: Not hard at all  Food Insecurity: No Food Insecurity (04/28/2023)   Hunger Vital Sign    Worried About Running Out of Food in the Last Year: Never true    Ran Out of Food in the Last Year: Never true  Transportation Needs: No Transportation Needs (04/28/2023)   PRAPARE - Administrator, Civil Service (Medical): No    Lack of Transportation (Non-Medical): No  Physical Activity: Sufficiently Active (04/28/2023)   Exercise Vital Sign    Days of Exercise per Week: 4 days    Minutes of Exercise per Session: 40 min  Stress: No Stress Concern Present (04/28/2023)   [employer] of Occupational Health - Occupational Stress Questionnaire    Feeling of Stress : Not at all  Social Connections: Moderately Isolated (04/28/2023)   Social Connection and Isolation Panel [NHANES]    Frequency of Communication with Friends and Family: More than three times a week    Frequency of Social Gatherings with Friends and Family: More than three times a week  Attends Religious Services: Never    Active Member of Clubs or Organizations: Yes    Attends Banker Meetings: More than 4 times per year    Marital Status: Widowed    Tobacco Counseling Counseling given: Not Answered   Clinical Intake:  Pre-visit preparation completed: Yes  Pain : 0-10 Pain Score: 3  (arthritis) Pain Type: Chronic pain Pain Location: Generalized Pain Descriptors / Indicators: Aching Pain Onset: More than a month ago     BMI - recorded: 16.42 Nutritional Status: BMI <19  Underweight Nutritional Risks: None Diabetes: No  How often do you need to have someone help you when you read instructions, pamphlets, or other written materials from your doctor or pharmacy?: 1 - Never  Interpreter Needed?: No  Information entered by :: C.Honestii Marton LPN   Activities of Daily Living    04/28/2023    9:28 AM  In your present state of health, do you have any difficulty performing the following activities:  Hearing? 0  Vision? 0  Difficulty concentrating or making decisions? 0  Walking or climbing stairs? 0  Dressing or bathing? 0  Doing errands, shopping? 0  Preparing Food and eating ? N  Using the Toilet? N  In the past six months, have you accidently leaked urine? Y  Comment occasionally at night  Do you have problems with loss of bowel control? N  Managing your Medications? N  Managing your Finances? N  Housekeeping or managing your Housekeeping? N    Patient Care Team: Doreene Nest, NP as PCP - General (Internal Medicine) Mariah Milling Tollie Pizza, MD as PCP -  Cardiology (Cardiology) Antonieta Iba, MD as Consulting Physician (Cardiology) Pollyann Savoy, MD as Consulting Physician (Rheumatology) Salena Saner, MD as Consulting Physician (Pulmonary Disease)  Indicate any recent Medical Services you may have received from other than [facility] providers in the past year (date may be approximate).     Assessment:   This is a routine wellness examination for Crystal Gregory.  Hearing/Vision screen Hearing Screening - Comments:: Wears aids Vision Screening - Comments:: Glasses - [facility] - UTD on eye exams  Dietary issues and exercise activities discussed:     Goals Addressed             This Visit's Progress    Patient Stated       Stay active       Depression Screen    04/28/2023    9:25 AM 12/22/2022    9:42 AM 04/26/2022    8:20 AM 01/21/2021    9:22 AM 11/17/2016    3:12 PM 08/10/2016   12:11 PM  PHQ 2/9 Scores  PHQ - 2 Score 0 0 0 0 0 1  PHQ- 9 Score   0 0 1 5    Fall Risk    04/28/2023    9:28 AM 12/22/2022    9:42 AM 04/26/2022    8:24 AM 01/21/2021    9:22 AM 08/10/2016   12:11 PM  Fall Risk   Falls in the past year? 0 0 0 0 No  Number falls in past yr: 0 0 0 0   Injury with Fall? 0 0 0 0   Risk for fall due to : No Fall Risks No Fall Risks No Fall Risks    Follow up Falls prevention discussed;Falls evaluation completed Falls evaluation completed Falls evaluation completed      MEDICARE RISK AT HOME:  Medicare Risk at Home - 04/28/23 9562  Any stairs in or around the home? No    If so, are there any without handrails? No    Home free of loose throw rugs in walkways, pet beds, electrical cords, etc? Yes    Adequate lighting in your home to reduce risk of falls? Yes    Life alert? No    Use of a cane, walker or w/c? No    Grab bars in the bathroom? Yes    Shower chair or bench in shower? Yes    Elevated toilet seat or a handicapped toilet? Yes             TIMED UP AND GO:  Was the test performed?   No    Cognitive Function:        04/28/2023    9:30 AM 04/26/2022    8:26 AM  6CIT Screen  What Year? 0 points 0 points  What month? 0 points 0 points  What time? 0 points 0 points  Count back from 20 0 points 0 points  Months in reverse 0 points 0 points  Repeat phrase 0 points 2 points  Total Score 0 points 2 points    Immunizations Immunization History  Administered Date(s) Administered   Fluad Quad(high Dose 65+) 07/24/2019, 07/04/2022   Influenza Split 06/04/2012   Influenza, High Dose Seasonal PF 09/10/2016   Influenza,inj,Quad PF,6+ Mos 07/04/2013, 08/18/2015, 07/13/2017, 06/19/2018   Influenza-Unspecified 07/15/2020   Moderna SARS-COV2 Booster Vaccination 05/19/2020, 02/19/2021   Moderna Sars-Covid-2 Vaccination 10/22/2019, 11/19/2019, 04/02/2022   Pneumococcal Conjugate-13 10/06/2011   Pneumococcal Polysaccharide-23 06/19/2018   Zoster, Live 01/14/2007    TDAP status: Up to date  Flu Vaccine status: Up to date  Pneumococcal vaccine status: Up to date  Covid-19 vaccine status: Information provided on how to obtain vaccines.   Qualifies for Shingles Vaccine? Yes   Zostavax completed  unknown   Shingrix Completed?: Yes  Screening Tests Health Maintenance  Topic Date Due   Zoster Vaccines- Shingrix (1 of 2) 12/23/1950   COVID-19 Vaccine (4 - 2023-24 season) 06/04/2022   Medicare Annual Wellness (AWV)  04/27/2023   INFLUENZA VACCINE  05/05/2023   Pneumonia Vaccine 35+ Years old  Completed   DEXA SCAN  Completed   HPV VACCINES  Aged Out   DTaP/Tdap/Td  Discontinued    Health Maintenance  Health Maintenance Due  Topic Date Due   Zoster Vaccines- Shingrix (1 of 2) 12/23/1950   COVID-19 Vaccine (4 - 2023-24 season) 06/04/2022   Medicare Annual Wellness (AWV)  04/27/2023    Colorectal cancer screening: No longer required.   Mammogram status: No longer required due to age.  Bone Density status: Completed 01/25/22. Results reflect: Bone density  results: OSTEOPOROSIS. Repeat every 2 years.  Lung Cancer Screening: (Low Dose CT Chest recommended if Age 79-80 years, 20 pack-year currently smoking OR have quit w/in 15years.) does not qualify.   Lung Cancer Screening Referral: n/a  Additional Screening:  Hepatitis C Screening: does not qualify; Completed n/a  Vision Screening: Recommended annual ophthalmology exams for early detection of glaucoma and other disorders of the eye. Is the patient up to date with their annual eye exam?  Yes  Who is the provider or what is the name of the office in which the patient attends annual eye exams? [facility] If pt is not established with a provider, would they like to be referred to a provider to establish care? Yes .   Dental Screening: Recommended annual dental exams for proper oral  hygiene    Community Resource Referral / Chronic Care Management: CRR required this visit?  No   CCM required this visit?  No     Plan:     I have personally reviewed and noted the following in the patient's chart:   Medical and social history Use of alcohol, tobacco or illicit drugs  Current medications and supplements including opioid prescriptions. Patient is not currently taking opioid prescriptions. Functional ability and status Nutritional status Physical activity Advanced directives List of other physicians Hospitalizations, surgeries, and ER visits in previous 12 months Vitals Screenings to include cognitive, depression, and falls Referrals and appointments  In addition, I have reviewed and discussed with patient certain preventive protocols, quality metrics, and best practice recommendations. A written personalized care plan for preventive services as well as general preventive health recommendations were provided to patient.     Maryan Puls, LPN   11/03/8655   After Visit Summary: (MyChart) Due to this being a telephonic visit, the after visit summary with patients  personalized plan was offered to patient via MyChart   Nurse Notes: none

## 2023-04-28 NOTE — Patient Instructions (Signed)
Crystal Gregory , Thank you for taking time to come for your Medicare Wellness Visit. I appreciate your ongoing commitment to your health goals. Please review the following plan we discussed and let me know if I can assist you in the future.   Referrals/Orders/Follow-Ups/Clinician Recommendations: Aim for 30 minutes of exercise or brisk walking, 6-8 glasses of water, and 5 servings of fruits and vegetables each day.   This is a list of the screening recommended for you and due dates:  Health Maintenance  Topic Date Due   Zoster (Shingles) Vaccine (1 of 2) 12/23/1950   COVID-19 Vaccine (4 - 2023-24 season) 06/04/2022   Medicare Annual Wellness Visit  04/27/2023   Flu Shot  05/05/2023   Pneumonia Vaccine  Completed   DEXA scan (bone density measurement)  Completed   HPV Vaccine  Aged Out   DTaP/Tdap/Td vaccine  Discontinued    Advanced directives: (Copy Requested) Please bring a copy of your health care power of attorney and living will to the office to be added to your chart at your convenience.  Next Medicare Annual Wellness Visit scheduled for next year: Yes  Preventive Care 5 Years and Older, Female Preventive care refers to lifestyle choices and visits with your health care provider that can promote health and wellness. What does preventive care include? A yearly physical exam. This is also called an annual well check. Dental exams once or twice a year. Routine eye exams. Ask your health care provider how often you should have your eyes checked. Personal lifestyle choices, including: Daily care of your teeth and gums. Regular physical activity. Eating a healthy diet. Avoiding tobacco and drug use. Limiting alcohol use. Practicing safe sex. Taking low-dose aspirin every day. Taking vitamin and mineral supplements as recommended by your health care provider. What happens during an annual well check? The services and screenings done by your health care provider during your annual  well check will depend on your age, overall health, lifestyle risk factors, and family history of disease. Counseling  Your health care provider may ask you questions about your: Alcohol use. Tobacco use. Drug use. Emotional well-being. Home and relationship well-being. Sexual activity. Eating habits. History of falls. Memory and ability to understand (cognition). Work and work Astronomer. Reproductive health. Screening  You may have the following tests or measurements: Height, weight, and BMI. Blood pressure. Lipid and cholesterol levels. These may be checked every 5 years, or more frequently if you are over 1 years old. Skin check. Lung cancer screening. You may have this screening every year starting at age 39 if you have a 30-pack-year history of smoking and currently smoke or have quit within the past 15 years. Fecal occult blood test (FOBT) of the stool. You may have this test every year starting at age 67. Flexible sigmoidoscopy or colonoscopy. You may have a sigmoidoscopy every 5 years or a colonoscopy every 10 years starting at age 102. Hepatitis C blood test. Hepatitis B blood test. Sexually transmitted disease (STD) testing. Diabetes screening. This is done by checking your blood sugar (glucose) after you have not eaten for a while (fasting). You may have this done every 1-3 years. Bone density scan. This is done to screen for osteoporosis. You may have this done starting at age 83. Mammogram. This may be done every 1-2 years. Talk to your health care provider about how often you should have regular mammograms. Talk with your health care provider about your test results, treatment options, and if necessary, the  need for more tests. Vaccines  Your health care provider may recommend certain vaccines, such as: Influenza vaccine. This is recommended every year. Tetanus, diphtheria, and acellular pertussis (Tdap, Td) vaccine. You may need a Td booster every 10 years. Zoster  vaccine. You may need this after age 21. Pneumococcal 13-valent conjugate (PCV13) vaccine. One dose is recommended after age 50. Pneumococcal polysaccharide (PPSV23) vaccine. One dose is recommended after age 62. Talk to your health care provider about which screenings and vaccines you need and how often you need them. This information is not intended to replace advice given to you by your health care provider. Make sure you discuss any questions you have with your health care provider. Document Released: 10/17/2015 Document Revised: 06/09/2016 Document Reviewed: 07/22/2015 Elsevier Interactive Patient Education  2017 [employer].  Fall Prevention in the Home Falls can cause injuries. They can happen to people of all ages. There are many things you can do to make your home safe and to help prevent falls. What can I do on the outside of my home? Regularly fix the edges of walkways and driveways and fix any cracks. Remove anything that might make you trip as you walk through a door, such as a raised step or threshold. Trim any bushes or trees on the path to your home. Use bright outdoor lighting. Clear any walking paths of anything that might make someone trip, such as rocks or tools. Regularly check to see if handrails are loose or broken. Make sure that both sides of any steps have handrails. Any raised decks and porches should have guardrails on the edges. Have any leaves, snow, or ice cleared regularly. Use sand or salt on walking paths during winter. Clean up any spills in your garage right away. This includes oil or grease spills. What can I do in the bathroom? Use night lights. Install grab bars by the toilet and in the tub and shower. Do not use towel bars as grab bars. Use non-skid mats or decals in the tub or shower. If you need to sit down in the shower, use a plastic, non-slip stool. Keep the floor dry. Clean up any water that spills on the floor as soon as it happens. Remove  soap buildup in the tub or shower regularly. Attach bath mats securely with double-sided non-slip rug tape. Do not have throw rugs and other things on the floor that can make you trip. What can I do in the bedroom? Use night lights. Make sure that you have a light by your bed that is easy to reach. Do not use any sheets or blankets that are too big for your bed. They should not hang down onto the floor. Have a firm chair that has side arms. You can use this for support while you get dressed. Do not have throw rugs and other things on the floor that can make you trip. What can I do in the kitchen? Clean up any spills right away. Avoid walking on wet floors. Keep items that you use a lot in easy-to-reach places. If you need to reach something above you, use a strong step stool that has a grab bar. Keep electrical cords out of the way. Do not use floor polish or wax that makes floors slippery. If you must use wax, use non-skid floor wax. Do not have throw rugs and other things on the floor that can make you trip. What can I do with my stairs? Do not leave any items on the  stairs. Make sure that there are handrails on both sides of the stairs and use them. Fix handrails that are broken or loose. Make sure that handrails are as long as the stairways. Check any carpeting to make sure that it is firmly attached to the stairs. Fix any carpet that is loose or worn. Avoid having throw rugs at the top or bottom of the stairs. If you do have throw rugs, attach them to the floor with carpet tape. Make sure that you have a light switch at the top of the stairs and the bottom of the stairs. If you do not have them, ask someone to add them for you. What else can I do to help prevent falls? Wear shoes that: Do not have high heels. Have rubber bottoms. Are comfortable and fit you well. Are closed at the toe. Do not wear sandals. If you use a stepladder: Make sure that it is fully opened. Do not climb a  closed stepladder. Make sure that both sides of the stepladder are locked into place. Ask someone to hold it for you, if possible. Clearly mark and make sure that you can see: Any grab bars or handrails. First and last steps. Where the edge of each step is. Use tools that help you move around (mobility aids) if they are needed. These include: Canes. Walkers. Scooters. Crutches. Turn on the lights when you go into a dark area. Replace any light bulbs as soon as they burn out. Set up your furniture so you have a clear path. Avoid moving your furniture around. If any of your floors are uneven, fix them. If there are any pets around you, be aware of where they are. Review your medicines with your doctor. Some medicines can make you feel dizzy. This can increase your chance of falling. Ask your doctor what other things that you can do to help prevent falls. This information is not intended to replace advice given to you by your health care provider. Make sure you discuss any questions you have with your health care provider. Document Released: 07/17/2009 Document Revised: 02/26/2016 Document Reviewed: 10/25/2014 Elsevier Interactive Patient Education  2017 [employer].

## 2023-04-30 ENCOUNTER — Encounter: Payer: Self-pay | Admitting: Pulmonary Disease

## 2023-05-16 DIAGNOSIS — M0579 Rheumatoid arthritis with rheumatoid factor of multiple sites without organ or systems involvement: Secondary | ICD-10-CM | POA: Diagnosis not present

## 2023-05-19 DIAGNOSIS — M0579 Rheumatoid arthritis with rheumatoid factor of multiple sites without organ or systems involvement: Secondary | ICD-10-CM | POA: Diagnosis not present

## 2023-06-10 ENCOUNTER — Telehealth: Payer: Self-pay | Admitting: Primary Care

## 2023-06-10 NOTE — Telephone Encounter (Signed)
Called and spoke with patient, she stated she was having some symptoms/reactions from her rheumatoid infusions for a a month or so now. She stated the nurse at [facility] spent time going over the symptoms she was experiencing, she consulted with the doctor at the facility and he advised the patient to see PCP as soon as possible. Questioned patient about the symptoms she is experiencing, she said some fatigue, possible GI issues, and a list of other symptoms (for which she would not name, said it was too long of a story to get into).She was extremely short of breath sounding over the phone. She was wanting to be seen at our office today, however there are no appts available with [facility] or any other providers. Advised I thought it would be best she speak with the triage nurse to determine if she needs care before next week, she declined and stated she would not go anywhere besides our office anyway. Offered to make appt for next available with [facility] D[facility] which was on 09/11, patient declined and stated that was too far out. Patient stated she would call Monday morning to see what her options were at that point. Offered again to transfer to triage, she declined. FYI to [place].

## 2023-06-10 NOTE — Telephone Encounter (Signed)
Unable to reach patient. Left voicemail to return call to our office.   

## 2023-06-10 NOTE — Telephone Encounter (Signed)
Noted  

## 2023-06-10 NOTE — Telephone Encounter (Signed)
If she believes she is having reactions from her rheumatoid arthritis infusions, then I recommend she call her rheumatologist ASAP.

## 2023-06-10 NOTE — Telephone Encounter (Signed)
Patient called in and would like for a nurse to give her a call  back concerning a appointment for a follow up appointment I look for a open slot today and there was no opening. patient stated she really needs to talk too K Clark. patient can be reached at [phone]

## 2023-06-10 NOTE — Telephone Encounter (Signed)
Called patient she states rheumatology is aware and they have canceled nest infusions. She does not feel benefits are worth the side effects.

## 2023-06-15 ENCOUNTER — Encounter: Payer: Self-pay | Admitting: Primary Care

## 2023-06-15 ENCOUNTER — Ambulatory Visit (INDEPENDENT_AMBULATORY_CARE_PROVIDER_SITE_OTHER): Payer: Medicare Other | Admitting: Primary Care

## 2023-06-15 VITALS — BP 130/76 | HR 77 | Temp 96.8°F | Ht 68.0 in | Wt 106.0 lb

## 2023-06-15 DIAGNOSIS — J849 Interstitial pulmonary disease, unspecified: Secondary | ICD-10-CM

## 2023-06-15 DIAGNOSIS — R509 Fever, unspecified: Secondary | ICD-10-CM | POA: Insufficient documentation

## 2023-06-15 HISTORY — DX: Fever, unspecified: R50.9

## 2023-06-15 LAB — CBC WITH DIFFERENTIAL/PLATELET
Basophils Absolute: 0.1 10*3/uL (ref 0.0–0.1)
Basophils Relative: 0.9 % (ref 0.0–3.0)
Eosinophils Absolute: 0 10*3/uL (ref 0.0–0.7)
Eosinophils Relative: 0.6 % (ref 0.0–5.0)
HCT: 32.3 % — ABNORMAL LOW (ref 36.0–46.0)
Hemoglobin: 10.8 g/dL — ABNORMAL LOW (ref 12.0–15.0)
Lymphocytes Relative: 16.5 % (ref 12.0–46.0)
Lymphs Abs: 1.1 10*3/uL (ref 0.7–4.0)
MCHC: 33.6 g/dL (ref 30.0–36.0)
MCV: 112.1 fl — ABNORMAL HIGH (ref 78.0–100.0)
Monocytes Absolute: 0.6 10*3/uL (ref 0.1–1.0)
Monocytes Relative: 9.7 % (ref 3.0–12.0)
Neutro Abs: 4.7 10*3/uL (ref 1.4–7.7)
Neutrophils Relative %: 72.3 % (ref 43.0–77.0)
Platelets: 386 10*3/uL (ref 150.0–400.0)
RBC: 2.88 Mil/uL — ABNORMAL LOW (ref 3.87–5.11)
RDW: 16.4 % — ABNORMAL HIGH (ref 11.5–15.5)
WBC: 6.5 10*3/uL (ref 4.0–10.5)

## 2023-06-15 LAB — BASIC METABOLIC PANEL
BUN: 18 mg/dL (ref 6–23)
CO2: 28 meq/L (ref 19–32)
Calcium: 9.2 mg/dL (ref 8.4–10.5)
Chloride: 99 meq/L (ref 96–112)
Creatinine, Ser: 0.65 mg/dL (ref 0.40–1.20)
GFR: 78.03 mL/min (ref >60.00)
Glucose, Bld: 88 mg/dL (ref 70–99)
Potassium: 4.4 meq/L (ref 3.5–5.1)
Sodium: 136 meq/L (ref 135–145)

## 2023-06-15 NOTE — Assessment & Plan Note (Signed)
Unclear etiology.  Given medical history need to rule out several possibilities for fever.  Chest x-ray ordered and pending. CBC with differential and BMP pending. Urinalysis ordered and pending.  She appears stable today for treatment. ED precautions provided. Await results.

## 2023-06-15 NOTE — Patient Instructions (Signed)
Stop by the lab prior to leaving today. I will notify you of your results once received.   Please return tomorrow for your chest x-ray and urine sample.  It was a pleasure to see you today!

## 2023-06-15 NOTE — Progress Notes (Signed)
Subjective:    Patient ID: Crystal Gregory, female    DOB: 1932-01-11, 89 y.o.   MRN: [id number]  Allergic Reaction Associated symptoms include coughing. Pertinent negatives include no chest pain or diarrhea.    Crystal Gregory is a very pleasant 89 y.o. female with a history of CHF, IPF, GERD, osteoporosis, rheumatoid arthritis, nontraumatic compression fracture, polyarthralgia who presents today to discuss infusion reaction.  Following with rheumatology and is managed on Orencea infusions for rheumatoid arthritis for which she began in May 2024.  She has been completing these monthly. She could not tolerate the Orencea so her regimen was switched to [employer] since with her last infusion on 05/19/2023.   She contacted our office on 06/10/2023 reporting numerous symptoms including fatigue, GI upset, shortness of breath that was suspected to be secondary to her most recent infusion.  She contacted her rheumatologist who discontinued infusions. Evaluated by a provider at [facility] on 06/10/23 who was concerned about an infection.   Today she mentions that her reaction symptoms included vertigo, chills, fatigue, esophageal reflux. Approximately 3-4 pm every day she experiences chills, this began 2 months ago. She takes Tylenol and the chills resolves. She took her temperature 3 weeks ago during her chills which was 101.  She continues to feel fatigued.  Mostly moves from the bed to her chair.  She denies increased cough, diarrhea, dysuria, increased sputum production, increased shortness of breath, urinary frequency. She is managed on azithromycin 250 mg once daily for prophylaxis of pneumonia.  She will no longer complete her infusions for rheumatoid arthritis.   Review of Systems  Constitutional:  Positive for fatigue and fever.  HENT:  Negative for congestion and sore throat.   Respiratory:  Positive for cough. Negative for shortness of breath.   Cardiovascular:  Negative for chest pain.   Gastrointestinal:  Negative for diarrhea.  Genitourinary:  Negative for dysuria and frequency.  Neurological:  Positive for light-headedness.         Past Medical History:  Diagnosis Date   Allergy    Anal fissure    Aspiration into airway    CAP (community acquired pneumonia) 08/23/2017   Chest pain    a. 09/04/2018 MV: EF >65%. No ischemia/infarct.   Coronary artery calcification seen on CT scan    Diastolic dysfunction    a. 10/2016 Echo: EF 60-65%, no rwma, Gr1 DD, mild MR, nl RV fxn, mild to mod TR, PASP .   Diverticulosis    Dyspnea    GERD (gastroesophageal reflux disease)    Hemorrhoids    Hyperplastic colon polyp    Interstitial lung disorders (HCC)    Pulmonary fibrosis (HCC)    Septicemia (HCC) 1999   spent 4 weeks in ICU, intubated -? PNA   Vertebral fracture, pathological     Social History   Socioeconomic History   Marital status: Widowed    Spouse name: Not on file   Number of children: 2   Years of education: Not on file   Highest education level: Not on file  Occupational History   Occupation: Retired   Tobacco Use   Smoking status: Former    Current packs/day: 0.00    Average packs/day: 0.3 packs/day for 20.0 years (5.0 ttl pk-yrs)    Types: Cigarettes    Start date: 01/16/1961    Quit date: 01/16/1981    Years since quitting: 42.4   Smokeless tobacco: Never  Vaping Use   Vaping status: Never Used  Substance  and Sexual Activity   Alcohol use: Yes    Alcohol/week: 7.0 standard drinks of alcohol    Types: 7 Standard drinks or equivalent per week   Drug use: No   Sexual activity: Not on file  Other Topics Concern   Not on file  Social History Narrative   Married.     2 children.  Lives with her husband in [place].   Social Determinants of Health   Financial Resource Strain: Low Risk  (04/28/2023)   Overall Financial Resource Strain (CARDIA)    Difficulty of Paying Living Expenses: Not hard at all  Food Insecurity: No Food  Insecurity (04/28/2023)   Hunger Vital Sign    Worried About Running Out of Food in the Last Year: Never true    Ran Out of Food in the Last Year: Never true  Transportation Needs: No Transportation Needs (04/28/2023)   PRAPARE - Administrator, Civil Service (Medical): No    Lack of Transportation (Non-Medical): No  Physical Activity: Sufficiently Active (04/28/2023)   Exercise Vital Sign    Days of Exercise per Week: 4 days    Minutes of Exercise per Session: 40 min  Stress: No Stress Concern Present (04/28/2023)   [employer] of Occupational Health - Occupational Stress Questionnaire    Feeling of Stress : Not at all  Social Connections: Moderately Isolated (04/28/2023)   Social Connection and Isolation Panel [NHANES]    Frequency of Communication with Friends and Family: More than three times a week    Frequency of Social Gatherings with Friends and Family: More than three times a week    Attends Religious Services: Never    Database administrator or Organizations: Yes    Attends Engineer, structural: More than 4 times per year    Marital Status: Widowed  Intimate Partner Violence: Not At Risk (04/28/2023)   Humiliation, Afraid, Rape, and Kick questionnaire    Fear of Current or Ex-Partner: No    Emotionally Abused: No    Physically Abused: No    Sexually Abused: No    Past Surgical History:  Procedure Laterality Date   ABDOMINAL HYSTERECTOMY     FLEXIBLE BRONCHOSCOPY N/A 07/05/2016   Procedure: FLEXIBLE BRONCHOSCOPY;  Surgeon: Stephanie Acre, MD;  Location: [facility] ORS;  Service: Cardiopulmonary;  Laterality: N/A;   IR RADIOLOGIST EVAL & MGMT  11/05/2021    Family History  Problem Relation Age of Onset   Parkinson's disease Mother    Colon polyps Mother 29           Ulcers Father    Rheumatic fever Father    Macular degeneration Sister    Healthy Son    Healthy Daughter     No Known Allergies  Current Outpatient Medications on File Prior to Visit   Medication Sig Dispense Refill   azaTHIOprine (IMURAN) 50 MG tablet TAKE ONE AND ONE-HALF TABLETS (75 MG) EVERY MORNING AND 1 TABLET (50 MG) EVERY EVENING 225 tablet 0   azithromycin (ZITHROMAX) 250 MG tablet Take 1 tablet 3x a week 30 each 3   denosumab (PROLIA) 60 MG/ML SOLN injection Inject 60 mg into the skin every 6 (six) months. Administer in upper arm, thigh, or abdomen     gabapentin (NEURONTIN) 100 MG capsule Take 2 capsules (200 mg total) by mouth at bedtime. 180 capsule 1   montelukast (SINGULAIR) 10 MG tablet Take 1 tablet (10 mg total) by mouth daily. 90 tablet 1   Multiple  Minerals-Vitamins (CALCIUM & VIT D3 BONE HEALTH PO) Take 1 Dose by mouth daily.     pantoprazole (PROTONIX) 20 MG tablet Take 1 tablet (20 mg total) by mouth 2 (two) times daily. For heartburn 180 tablet 2   predniSONE (DELTASONE) 1 MG tablet Take 2 tablets (2 mg total) by mouth daily with breakfast. 180 tablet 0   propranolol (INDERAL) 10 MG tablet TAKE 1 TABLET TWICE A DAY FOR TREMOR 180 tablet 2   traZODone (DESYREL) 50 MG tablet Take 1 tablet (50 mg total) by mouth at bedtime as needed for sleep. For sleep. 90 tablet 3   vitamin B-12 (CYANOCOBALAMIN) 500 MCG tablet Take 500 mcg by mouth daily.     cyclobenzaprine (FLEXERIL) 5 MG tablet Take 5 mg by mouth at bedtime. (Patient not taking: Reported on 06/15/2023)     No current facility-administered medications on file prior to visit.    BP 130/76   Pulse 77   Temp (!) 96.8 F (36 C) (Temporal)   Ht 5\' 8"  (1.727 m)   Wt 106 lb (48.1 kg)   SpO2 96%   BMI 16.12 kg/m  Objective:   Physical Exam Constitutional:      General: She is not in acute distress.    Appearance: She is not ill-appearing.  HENT:     Nose:     Right Sinus: No maxillary sinus tenderness or frontal sinus tenderness.     Left Sinus: No maxillary sinus tenderness or frontal sinus tenderness.     Mouth/Throat:     Pharynx: No posterior oropharyngeal erythema.  Eyes:      Conjunctiva/sclera: Conjunctivae normal.  Cardiovascular:     Rate and Rhythm: Normal rate and regular rhythm.  Pulmonary:     Effort: Pulmonary effort is normal.     Breath sounds: Normal breath sounds. No wheezing or rales.  Musculoskeletal:     Cervical back: Neck supple.  Lymphadenopathy:     Cervical: No cervical adenopathy.  Skin:    General: Skin is warm and dry.           Assessment & Plan:  Fever, unspecified fever cause Assessment & Plan: Unclear etiology.  Given medical history need to rule out several possibilities for fever.  Chest x-ray ordered and pending. CBC with differential and BMP pending. Urinalysis ordered and pending.  She appears stable today for treatment. ED precautions provided. Await results.  Orders: -     CBC with Differential/Platelet -     Basic metabolic panel -     DG Chest 2 View; Future -     POCT Urinalysis Dipstick (Automated); Future  ILD (interstitial lung disease) (HCC) -     DG Chest 2 View; Future        Doreene Nest, NP

## 2023-06-16 ENCOUNTER — Other Ambulatory Visit (INDEPENDENT_AMBULATORY_CARE_PROVIDER_SITE_OTHER): Payer: Medicare Other

## 2023-06-16 ENCOUNTER — Encounter: Payer: Self-pay | Admitting: Pulmonary Disease

## 2023-06-16 ENCOUNTER — Ambulatory Visit (INDEPENDENT_AMBULATORY_CARE_PROVIDER_SITE_OTHER)
Admission: RE | Admit: 2023-06-16 | Discharge: 2023-06-16 | Disposition: A | Discharge status: Home / Self Care | Payer: Medicare Other | Source: Ambulatory Visit | Attending: Primary Care | Admitting: Primary Care

## 2023-06-16 DIAGNOSIS — J90 Pleural effusion, not elsewhere classified: Secondary | ICD-10-CM | POA: Diagnosis not present

## 2023-06-16 DIAGNOSIS — J849 Interstitial pulmonary disease, unspecified: Secondary | ICD-10-CM

## 2023-06-16 DIAGNOSIS — R509 Fever, unspecified: Secondary | ICD-10-CM | POA: Diagnosis not present

## 2023-06-16 DIAGNOSIS — J8410 Pulmonary fibrosis, unspecified: Secondary | ICD-10-CM | POA: Diagnosis not present

## 2023-06-16 LAB — POC URINALSYSI DIPSTICK (AUTOMATED)
Bilirubin, UA: NEGATIVE
Blood, UA: NEGATIVE
Glucose, UA: NEGATIVE
Ketones, UA: NEGATIVE
Leukocytes, UA: NEGATIVE
Nitrite, UA: NEGATIVE
Protein, UA: POSITIVE — AB
Spec Grav, UA: 1.01 (ref 1.010–1.025)
Urobilinogen, UA: 0.2 U/dL
pH, UA: 6.5 (ref 5.0–8.0)

## 2023-06-16 MED ORDER — GABAPENTIN 100 MG PO CAPS: 200.0000 mg | ORAL_CAPSULE | Freq: Every day | ORAL | 1 refills | Status: DC

## 2023-07-01 DIAGNOSIS — Z23 Encounter for immunization: Secondary | ICD-10-CM | POA: Diagnosis not present

## 2023-07-04 DIAGNOSIS — R42 Dizziness and giddiness: Secondary | ICD-10-CM

## 2023-07-04 MED ORDER — MECLIZINE HCL 12.5 MG PO TABS
12.5000 mg | ORAL_TABLET | Freq: Three times a day (TID) | ORAL | 0 refills | Status: DC | PRN
Start: 2023-07-04 — End: 2023-12-20

## 2023-07-08 ENCOUNTER — Inpatient Hospital Stay (HOSPITAL_BASED_OUTPATIENT_CLINIC_OR_DEPARTMENT_OTHER): Payer: Medicare Other | Admitting: Oncology

## 2023-07-08 ENCOUNTER — Encounter: Payer: Self-pay | Admitting: Oncology

## 2023-07-08 ENCOUNTER — Inpatient Hospital Stay: Payer: Medicare Other | Attending: Oncology

## 2023-07-08 VITALS — BP 110/60 | HR 78 | Temp 95.9°F | Resp 18 | Ht 68.0 in | Wt 102.9 lb

## 2023-07-08 DIAGNOSIS — M2550 Pain in unspecified joint: Secondary | ICD-10-CM | POA: Diagnosis not present

## 2023-07-08 DIAGNOSIS — R42 Dizziness and giddiness: Secondary | ICD-10-CM | POA: Insufficient documentation

## 2023-07-08 DIAGNOSIS — D539 Nutritional anemia, unspecified: Secondary | ICD-10-CM | POA: Diagnosis not present

## 2023-07-08 DIAGNOSIS — Z79899 Other long term (current) drug therapy: Secondary | ICD-10-CM | POA: Insufficient documentation

## 2023-07-08 DIAGNOSIS — Z83719 Family history of colon polyps, unspecified: Secondary | ICD-10-CM | POA: Diagnosis not present

## 2023-07-08 DIAGNOSIS — Z818 Family history of other mental and behavioral disorders: Secondary | ICD-10-CM | POA: Insufficient documentation

## 2023-07-08 DIAGNOSIS — R5383 Other fatigue: Secondary | ICD-10-CM | POA: Diagnosis not present

## 2023-07-08 DIAGNOSIS — K219 Gastro-esophageal reflux disease without esophagitis: Secondary | ICD-10-CM | POA: Diagnosis not present

## 2023-07-08 DIAGNOSIS — J849 Interstitial pulmonary disease, unspecified: Secondary | ICD-10-CM | POA: Insufficient documentation

## 2023-07-08 DIAGNOSIS — Z83518 Family history of other specified eye disorder: Secondary | ICD-10-CM | POA: Insufficient documentation

## 2023-07-08 DIAGNOSIS — Z8719 Personal history of other diseases of the digestive system: Secondary | ICD-10-CM | POA: Insufficient documentation

## 2023-07-08 DIAGNOSIS — M069 Rheumatoid arthritis, unspecified: Secondary | ICD-10-CM | POA: Diagnosis not present

## 2023-07-08 DIAGNOSIS — E538 Deficiency of other specified B group vitamins: Secondary | ICD-10-CM

## 2023-07-08 DIAGNOSIS — Z90710 Acquired absence of both cervix and uterus: Secondary | ICD-10-CM | POA: Diagnosis not present

## 2023-07-08 DIAGNOSIS — D638 Anemia in other chronic diseases classified elsewhere: Secondary | ICD-10-CM

## 2023-07-08 DIAGNOSIS — J90 Pleural effusion, not elsewhere classified: Secondary | ICD-10-CM | POA: Insufficient documentation

## 2023-07-08 DIAGNOSIS — Z860102 Personal history of hyperplastic colon polyps: Secondary | ICD-10-CM | POA: Diagnosis not present

## 2023-07-08 DIAGNOSIS — Z87891 Personal history of nicotine dependence: Secondary | ICD-10-CM | POA: Insufficient documentation

## 2023-07-08 LAB — CBC WITH DIFFERENTIAL/PLATELET
Abs Immature Granulocytes: 0.02 10*3/uL (ref 0.00–0.07)
Basophils Absolute: 0 10*3/uL (ref 0.0–0.1)
Basophils Relative: 1 %
Eosinophils Absolute: 0.1 10*3/uL (ref 0.0–0.5)
Eosinophils Relative: 1 %
HCT: 32.5 % — ABNORMAL LOW (ref 36.0–46.0)
Hemoglobin: 10.7 g/dL — ABNORMAL LOW (ref 12.0–15.0)
Immature Granulocytes: 0 %
Lymphocytes Relative: 35 %
Lymphs Abs: 2.1 10*3/uL (ref 0.7–4.0)
MCH: 37.3 pg — ABNORMAL HIGH (ref 26.0–34.0)
MCHC: 32.9 g/dL (ref 30.0–36.0)
MCV: 113.2 fL — ABNORMAL HIGH (ref 80.0–100.0)
Monocytes Absolute: 0.6 10*3/uL (ref 0.1–1.0)
Monocytes Relative: 10 %
Neutro Abs: 3.3 10*3/uL (ref 1.7–7.7)
Neutrophils Relative %: 53 %
Platelets: 275 10*3/uL (ref 150–400)
RBC: 2.87 MIL/uL — ABNORMAL LOW (ref 3.87–5.11)
RDW: 14.9 % (ref 11.5–15.5)
WBC: 6.1 10*3/uL (ref 4.0–10.5)
nRBC: 0 % (ref 0.0–0.2)

## 2023-07-08 LAB — IRON AND TIBC
Iron: 74 ug/dL (ref 28–170)
Saturation Ratios: 32 % — ABNORMAL HIGH (ref 10.4–31.8)
TIBC: 230 ug/dL — ABNORMAL LOW (ref 250–450)
UIBC: 156 ug/dL

## 2023-07-08 LAB — FERRITIN: Ferritin: 270 ng/mL (ref 11–307)

## 2023-07-08 NOTE — Progress Notes (Signed)
Hematology/Oncology Consult note [facility]  [phone] [phone]  Patient Care Team: Doreene Nest, NP as PCP - General (Internal Medicine) Antonieta Iba, MD as PCP - Cardiology (Cardiology) Antonieta Iba, MD as Consulting Physician (Cardiology) Pollyann Savoy, MD as Consulting Physician (Rheumatology) Salena Saner, MD as Consulting Physician (Pulmonary Disease)   Name of the patient: Crystal Gregory  [id number]  January 02, 1932   Date of visit: 07/08/23  Diagnosis-macrocytic anemia likely secondary to chronic disease  Chief complaint/ Reason for visit-routine follow-up of anemia  Heme/Onc history: Patient is a 89 year old female referred for macrocytic anemia.  She has a prior history of rheumatoid arthritis as well as interstitial lung disease from rheumatoid arthritis as well.  She is not on home oxygen.  She has been referred for anemia.  Dating back at her CBC is her hemoglobin was always between 10-11 even back in 2018 and 2019.  Her most recent CBC from 08/06/2020 showed white count of 4.7, H&H of 9.5/28.8 with an MCV of 114 and platelet count of 276.  She has developed progressive macrocytosis since 2019.  B12 levels were mildly low at 277.  Patient reports chronic fatigue and exertional shortness of breath.   She is on Imuran for rheumatoid arthritis   Results of blood work from 08/11/2020 were as follows: CBC showed white count of 4.8, H&H of 9.9/29.6 with an MCV of 114 and a platelet count of 300.  B12 level was low at 277.  Folate and TSH was normal.  EPO level was elevated at 100.  Haptoglobin was normal.  Myeloma panel showed polyclonal increase but no monoclonal protein.  Reticulocyte count was inappropriately low for the degree of anemia at 2.2%.  CMP showed normal kidney and liver functions.  Interval history-patient reports symptoms of vertigo that have been ongoing for the last 2 weeks.  She was seen by her  primary care provider and was started on meclizine which has not helped her and therefore she has been referred to occupational therapy for her repositioning maneuver.  She reports that her joint pain is getting worse and she has not tolerated biological agents well in the recent past.  ECOG PS- 2 Pain scale- 4   Review of systems- Review of Systems  Constitutional:  Positive for malaise/fatigue.  Musculoskeletal:  Positive for joint pain.  Neurological:  Positive for dizziness.      No Known Allergies   Past Medical History:  Diagnosis Date   Allergy    Anal fissure    Aspiration into airway    CAP (community acquired pneumonia) 08/23/2017   Chest pain    a. 09/04/2018 MV: EF >65%. No ischemia/infarct.   Coronary artery calcification seen on CT scan    Diastolic dysfunction    a. 10/2016 Echo: EF 60-65%, no rwma, Gr1 DD, mild MR, nl RV fxn, mild to mod TR, PASP .   Diverticulosis    Dyspnea    GERD (gastroesophageal reflux disease)    Hemorrhoids    Hyperplastic colon polyp    Interstitial lung disorders (HCC)    Pulmonary fibrosis (HCC)    Septicemia (HCC) 1999   spent 4 weeks in ICU, intubated -? PNA   Vertebral fracture, pathological      Past Surgical History:  Procedure Laterality Date   ABDOMINAL HYSTERECTOMY     FLEXIBLE BRONCHOSCOPY N/A 07/05/2016   Procedure: FLEXIBLE BRONCHOSCOPY;  Surgeon: Stephanie Acre, MD;  Location: [facility] ORS;  Service:  Cardiopulmonary;  Laterality: N/A;   IR RADIOLOGIST EVAL & MGMT  11/05/2021    Social History   Socioeconomic History   Marital status: Widowed    Spouse name: Not on file   Number of children: 2   Years of education: Not on file   Highest education level: Not on file  Occupational History   Occupation: Retired   Tobacco Use   Smoking status: Former    Current packs/day: 0.00    Average packs/day: 0.3 packs/day for 20.0 years (5.0 ttl pk-yrs)    Types: Cigarettes    Start date: 01/16/1961    Quit date:  01/16/1981    Years since quitting: 42.5   Smokeless tobacco: Never  Vaping Use   Vaping status: Never Used  Substance and Sexual Activity   Alcohol use: Yes    Alcohol/week: 7.0 standard drinks of alcohol    Types: 7 Standard drinks or equivalent per week   Drug use: No   Sexual activity: Not on file  Other Topics Concern   Not on file  Social History Narrative   Married.     2 children.  Lives with her husband in [place].   Social Determinants of Health   Financial Resource Strain: Low Risk  (04/28/2023)   Overall Financial Resource Strain (CARDIA)    Difficulty of Paying Living Expenses: Not hard at all  Food Insecurity: No Food Insecurity (04/28/2023)   Hunger Vital Sign    Worried About Running Out of Food in the Last Year: Never true    Ran Out of Food in the Last Year: Never true  Transportation Needs: No Transportation Needs (04/28/2023)   PRAPARE - Administrator, Civil Service (Medical): No    Lack of Transportation (Non-Medical): No  Physical Activity: Sufficiently Active (04/28/2023)   Exercise Vital Sign    Days of Exercise per Week: 4 days    Minutes of Exercise per Session: 40 min  Stress: No Stress Concern Present (04/28/2023)   [employer] of Occupational Health - Occupational Stress Questionnaire    Feeling of Stress : Not at all  Social Connections: Moderately Isolated (04/28/2023)   Social Connection and Isolation Panel [NHANES]    Frequency of Communication with Friends and Family: More than three times a week    Frequency of Social Gatherings with Friends and Family: More than three times a week    Attends Religious Services: Never    Database administrator or Organizations: Yes    Attends Engineer, structural: More than 4 times per year    Marital Status: Widowed  Intimate Partner Violence: Not At Risk (04/28/2023)   Humiliation, Afraid, Rape, and Kick questionnaire    Fear of Current or Ex-Partner: No    Emotionally Abused:  No    Physically Abused: No    Sexually Abused: No    Family History  Problem Relation Age of Onset   Parkinson's disease Mother    Colon polyps Mother 45           Ulcers Father    Rheumatic fever Father    Macular degeneration Sister    Healthy Son    Healthy Daughter      Current Outpatient Medications:    azaTHIOprine (IMURAN) 50 MG tablet, TAKE ONE AND ONE-HALF TABLETS (75 MG) EVERY MORNING AND 1 TABLET (50 MG) EVERY EVENING, Disp: 225 tablet, Rfl: 0   azithromycin (ZITHROMAX) 250 MG tablet, Take 1 tablet 3x a week, Disp:  30 each, Rfl: 3   denosumab (PROLIA) 60 MG/ML SOLN injection, Inject 60 mg into the skin every 6 (six) months. Administer in upper arm, thigh, or abdomen, Disp: , Rfl:    gabapentin (NEURONTIN) 100 MG capsule, Take 2 capsules (200 mg total) by mouth at bedtime., Disp: 180 capsule, Rfl: 1   meclizine (ANTIVERT) 12.5 MG tablet, Take 1 tablet (12.5 mg total) by mouth 3 (three) times daily as needed for dizziness., Disp: 30 tablet, Rfl: 0   montelukast (SINGULAIR) 10 MG tablet, Take 1 tablet (10 mg total) by mouth daily., Disp: 90 tablet, Rfl: 1   Multiple Minerals-Vitamins (CALCIUM & VIT D3 BONE HEALTH PO), Take 1 Dose by mouth daily., Disp: , Rfl:    pantoprazole (PROTONIX) 20 MG tablet, Take 1 tablet (20 mg total) by mouth 2 (two) times daily. For heartburn, Disp: 180 tablet, Rfl: 2   predniSONE (DELTASONE) 1 MG tablet, Take 2 tablets (2 mg total) by mouth daily with breakfast., Disp: 180 tablet, Rfl: 0   propranolol (INDERAL) 10 MG tablet, TAKE 1 TABLET TWICE A DAY FOR TREMOR, Disp: 180 tablet, Rfl: 2   traZODone (DESYREL) 50 MG tablet, Take 1 tablet (50 mg total) by mouth at bedtime as needed for sleep. For sleep., Disp: 90 tablet, Rfl: 3   vitamin B-12 (CYANOCOBALAMIN) 500 MCG tablet, Take 500 mcg by mouth daily., Disp: , Rfl:    cyclobenzaprine (FLEXERIL) 5 MG tablet, Take 5 mg by mouth at bedtime. (Patient not taking: Reported on 06/15/2023), Disp: , Rfl:    Physical exam:  Vitals:   07/08/23 1015  BP: 110/60  Pulse: 78  Resp: 18  Temp: (!) 95.9 F (35.5 C)  TempSrc: Tympanic  SpO2: 100%  Weight: 102 lb 14.4 oz (46.7 kg)  Height: 5\' 8"  (1.727 m)   Physical Exam Cardiovascular:     Rate and Rhythm: Normal rate and regular rhythm.     Heart sounds: Normal heart sounds.  Pulmonary:     Effort: Pulmonary effort is normal.     Breath sounds: Normal breath sounds.  Skin:    General: Skin is warm and dry.  Neurological:     Mental Status: She is alert and oriented to person, place, and time.         Latest Ref Rng & Units 06/15/2023   11:34 AM  CMP  Glucose 70 - 99 mg/dL 88   BUN 6 - 23 mg/dL 18   Creatinine 4.78 - 1.20 mg/dL 2.95   Sodium 621 - 308 mEq/L 136   Potassium 3.5 - 5.1 mEq/L 4.4   Chloride 96 - 112 mEq/L 99   CO2 19 - 32 mEq/L 28   Calcium 8.4 - 10.5 mg/dL 9.2       Latest Ref Rng & Units 07/08/2023   10:01 AM  CBC  WBC 4.0 - 10.5 K/uL 6.1   Hemoglobin 12.0 - 15.0 g/dL 65.7   Hematocrit 84.6 - 46.0 % 32.5   Platelets 150 - 400 K/uL 275     No images are attached to the encounter.  DG Chest 2 View  Result Date: 06/16/2023 CLINICAL DATA:  Fevers, history of interstitial lung disease EXAM: CHEST - 2 VIEW COMPARISON:  01/14/2022 FINDINGS: Pulmonary fibrosis and chronic, loculated appearing right pleural effusion, unchanged. Unchanged, benign nodular opacity projecting over the right midlung. Heart and mediastinum are normal. Disc degenerative disease of the thoracic spine. IMPRESSION: Pulmonary fibrosis and chronic, loculated appearing right pleural effusion, unchanged. No acute abnormality  of the lungs. Electronically Signed   By: Jearld Lesch M.D.   On: 06/16/2023 13:06     Assessment and plan- Patient is a 89 y.o. female here for routine follow-up of Anemia of chronic disease  Patient's hemoglobin has remained stable between 9.5-10.5 over the last 1 to 2 years.  Today her hemoglobin is 10.7.  She has had  persistent macrocytosis over the years possibly secondary to Imuran although there may be a component of myelodysplastic syndrome given her age.  She has not required any bone marrow biopsy so far due to nonsevere anemia.  White count and platelet counts are normal.  Ferritin studies and iron studies are not indicative of iron deficiency today and she does not require any IV iron at this time.  Repeat CBC ferritin and iron studies in 3 and 6 months and I will see her back in 6 months.  Will also repeat B12 levels in 3 months   Visit Diagnosis 1. B12 deficiency   2. Anemia of chronic disease   3. Macrocytic anemia      Dr. Owens Shark, MD, MPH [facility] [phone] 07/08/2023 12:34 PM

## 2023-07-13 DIAGNOSIS — R42 Dizziness and giddiness: Secondary | ICD-10-CM | POA: Diagnosis not present

## 2023-07-19 DIAGNOSIS — R42 Dizziness and giddiness: Secondary | ICD-10-CM | POA: Diagnosis not present

## 2023-07-20 DIAGNOSIS — Z79899 Other long term (current) drug therapy: Secondary | ICD-10-CM | POA: Diagnosis not present

## 2023-07-20 DIAGNOSIS — M47812 Spondylosis without myelopathy or radiculopathy, cervical region: Secondary | ICD-10-CM | POA: Diagnosis not present

## 2023-07-20 DIAGNOSIS — R42 Dizziness and giddiness: Secondary | ICD-10-CM | POA: Diagnosis not present

## 2023-07-20 DIAGNOSIS — J849 Interstitial pulmonary disease, unspecified: Secondary | ICD-10-CM | POA: Diagnosis not present

## 2023-07-20 DIAGNOSIS — M0579 Rheumatoid arthritis with rheumatoid factor of multiple sites without organ or systems involvement: Secondary | ICD-10-CM | POA: Diagnosis not present

## 2023-07-20 DIAGNOSIS — M059 Rheumatoid arthritis with rheumatoid factor, unspecified: Secondary | ICD-10-CM | POA: Diagnosis not present

## 2023-07-21 DIAGNOSIS — R42 Dizziness and giddiness: Secondary | ICD-10-CM | POA: Diagnosis not present

## 2023-07-22 ENCOUNTER — Encounter: Payer: Self-pay | Admitting: Pulmonary Disease

## 2023-07-22 MED ORDER — AZITHROMYCIN 250 MG PO TABS: ORAL_TABLET | ORAL | 3 refills | Status: DC

## 2023-07-22 NOTE — Telephone Encounter (Signed)
Per Dr. Jayme Cloud, ok to refill.

## 2023-07-25 DIAGNOSIS — R42 Dizziness and giddiness: Secondary | ICD-10-CM | POA: Diagnosis not present

## 2023-07-25 NOTE — Telephone Encounter (Signed)
Agree with refilling Azithromycin.

## 2023-07-28 DIAGNOSIS — Z23 Encounter for immunization: Secondary | ICD-10-CM | POA: Diagnosis not present

## 2023-07-29 DIAGNOSIS — R42 Dizziness and giddiness: Secondary | ICD-10-CM | POA: Diagnosis not present

## 2023-08-01 DIAGNOSIS — R42 Dizziness and giddiness: Secondary | ICD-10-CM | POA: Diagnosis not present

## 2023-08-04 ENCOUNTER — Encounter: Payer: Self-pay | Admitting: Pulmonary Disease

## 2023-08-04 ENCOUNTER — Ambulatory Visit: Payer: Medicare Other | Admitting: Pulmonary Disease

## 2023-08-04 VITALS — BP 110/62 | HR 82 | Temp 97.1°F | Ht 68.0 in | Wt 108.4 lb

## 2023-08-04 DIAGNOSIS — I5189 Other ill-defined heart diseases: Secondary | ICD-10-CM | POA: Diagnosis not present

## 2023-08-04 DIAGNOSIS — M0579 Rheumatoid arthritis with rheumatoid factor of multiple sites without organ or systems involvement: Secondary | ICD-10-CM | POA: Diagnosis not present

## 2023-08-04 DIAGNOSIS — R053 Chronic cough: Secondary | ICD-10-CM | POA: Diagnosis not present

## 2023-08-04 DIAGNOSIS — K224 Dyskinesia of esophagus: Secondary | ICD-10-CM | POA: Diagnosis not present

## 2023-08-04 DIAGNOSIS — J479 Bronchiectasis, uncomplicated: Secondary | ICD-10-CM

## 2023-08-04 DIAGNOSIS — J849 Interstitial pulmonary disease, unspecified: Secondary | ICD-10-CM

## 2023-08-04 NOTE — Patient Instructions (Signed)
VISIT SUMMARY:  During today's visit, we discussed your concerns about your voice changes, rheumatoid arthritis, vertigo, and lung conditions. We reviewed your current treatments and made some recommendations for further management.  YOUR PLAN:  -PRESBYPHONIA: Presbyphonia is an age-related change in the voice that can cause it to sound creaky. We discussed the potential benefits of voice therapy and exercises, such as playing the harmonica, to strengthen your vocal muscles. If your symptoms persist or worsen, consider starting voice therapy.  -RHEUMATOID ARTHRITIS: Rheumatoid arthritis is an autoimmune condition that causes inflammation in the joints. You expressed dissatisfaction with your current treatment. We recommend continuing your current regimen of Imuran and prednisone and considering a second opinion from a different rheumatologist, possibly at [facility].  -VERTIGO: Vertigo is a sensation of spinning or dizziness that can affect your balance and daily activities. You are currently receiving physical therapy for this. We recommend continuing with physical therapy and considering a referral to a neurologist if your symptoms persist or worsen.  -PULMONARY FIBROSIS AND BRONCHIECTASIS: Pulmonary fibrosis and bronchiectasis are lung conditions that cause scarring and widening of the airways, leading to a productive cough. Your condition is stable, and you should continue taking azithromycin three times a week. Please return for a follow-up in three months, or sooner if your shortness of breath or cough worsens.  -GENERAL HEALTH MAINTENANCE: Continue your B12 supplementation to prevent further decrease in levels. Ensure you are up-to-date with vaccinations, including the flu.  INSTRUCTIONS:  Please follow up in three months for your lung conditions, or sooner if your shortness of breath or cough worsens. Consider seeking a second opinion from a different rheumatologist for your rheumatoid arthritis.  If your vertigo symptoms persist or worsen, we may need to refer you to a neurologist.

## 2023-08-04 NOTE — Progress Notes (Signed)
Subjective:    Patient ID: Crystal Gregory, female    DOB: 1932-05-15, 89 y.o.   MRN: [id number]  Patient Care Team: Doreene Nest, NP as PCP - General (Internal Medicine) Antonieta Iba, MD as PCP - Cardiology (Cardiology) Antonieta Iba, MD as Consulting Physician (Cardiology) Pollyann Savoy, MD as Consulting Physician (Rheumatology) Salena Saner, MD as Consulting Physician (Pulmonary Disease)  Chief Complaint  Patient presents with   Follow-up    DOE. Cough with green sputum. No wheezing.     BACKGROUND/INTERVAL:89 y.o. with remote minimal smoking history (15 PY, quit 1982) initially evaluated by Dr Dema Severin 03/2016 and subsequently by Dr Marchelle Gearing for pulmonary fibrosis. Pt has prior history of severe ARDS (1999) and had been on chronic nitrofurantoin for recurrent UTIs. She has been tried on 2 maintenance inhalers without discernible benefit.  Since her prior visit on 27 April 2023 not had any exacerbations.  PROBLEMS: History of prior ARDS 1999 Prior long term nitrofurantoin therapy Pulmonary fibrosis Bronchiectasis Mild COPD Rheumatoid arthritis (RF/CCP positive) Dysphagia with chronic silent aspiration  HPI Discussed the use of AI scribe software for clinical note transcription with the patient, who gave verbal consent to proceed.  History of Present Illness   The patient, with a history of pulmonary fibrosis and bronchiectasis, presents with a chief complaint of a "creaky" voice, which she has noticed particularly during long phone conversations. She was previously informed that this is due to presbyphonia, a condition associated with aging. She had been scheduled for voice therapy in the past, but she deemed unnecessary at the time. She is considering using a harmonica to strengthen her vocal muscles as a form of self-therapy.  The patient also reports a stable condition of her lung diseases, initially diagnosed as idiopathic, but later associated with  the onset of rheumatoid arthritis. She expresses dissatisfaction with her current rheumatoid arthritis treatment, which includes Imuran (75 and 50), and has discontinued biologic infusions due to severe fatigue. She has tried two different biologic medications, both of which resulted in worsening fatigue to the point of barely being able to move short distances. She has also experienced vertigo, which she initially thought was a side effect of the biologics, but it has persisted even after discontinuing the medications. The severity of the vertigo varies day-to-day, and on bad days, it restricts her activities significantly, including discontinuing yoga. She is currently receiving physical therapy for the vertigo, which includes eye exercises.  The patient also reports issues with her fingers due to rheumatoid arthritis, which is the most disruptive aspect of her condition. She has noticed a decrease in her range of motion, particularly in her hands, and also feels the effects in her neck and other parts of her body. She is currently on a regimen of prednisone, which she has increased from 2mg  to 3mg .  The patient has a productive cough, particularly in the mornings and evenings, and is on azithromycin three times a week. She believes this has helped keep her lung conditions stable and prevented hospitalizations. She has a history of pneumonia, which has made her averse to hospital stays. She is also on B12 supplementation due to a decreasing B12 level, despite it being within the normal range.    DATA: CT chest 03/22/16: Generalized pulmonary hyperinflation but without bullous emphysema. Widespread bronchiectasis with bronchial wall thickening consistent with inflammatory bronchitis. Areas of pulmonary fibrosis, most pronounced in the lower lobes, where the bronchiectasis is more extensive, including areas of pulmonary lung destruction  HRCT chest 06/11/16: Diffuse cylindrical and varicoid bronchiectasis  throughout both lungs, most severe at the lung bases. Basilar predominant fibrotic interstitial lung disease with patchy honeycombing, with mild progression in the short interval since 03/22/2016 Spirometry 06/18/16: Mild obstruction, FEV1 1.42 liters (63%), FVC 2.43 (80%) Bronchoscopy 07/05/16: Normal airway exam. BAL negative for AFB Echocardiogram 10/07/15: LVEF 60%, Grade I DD, mild MR, RVSP estimate 35 mmHg HRCT 11/04/16: again compatible with interstitial lung disease, and considered diagnostic of usual interstitial pneumonia (UIP) from an imaging standpoint. There has been no significant progression of disease compared to the recent prior examination PFT 02/10/17: no obstruction, normal TLC, moderate reduction in DLCO 03/11/17: 345 m. No desaturation PFTs 11/22/17: No obstruction.  Lung volumes normal.  DLCO moderately reduced (55% predicted). NSC since 02/10/17 PFTs 11/23/18 : FVC: 2.67 L (95 %pred), FEV1: 2.35 L (112 %pred), FEV1/FVC: 88%, TLC: 4.98 L (88 %pred), DLCO 42 %pred 2D echo 11/16/2019:Left ventricular ejection fraction, by estimation, is 60 to 65%. Left ventricular diastolic parameters were normal.Right ventricular systolic function is normal. The right ventricular size is normal. Mildly elevated pulmonary artery systolic pressure. The estimated right ventricular systolic pressure is 39.4 mmHg. Left atrial size was mildly dilated.Tricuspid valve regurgitation is mild to moderate CT chest 02/15/2020:The appearance of the lungs is compatible with interstitial lung disease, with a spectrum of findings considered diagnostic of usual interstitial pneumonia (UIP) per current ATS guidelines. Minimal progression compared to the prior examination. Barium swallow study 03/05/2020: Tertiary contractions of the esophagus mild spasm, one episode of tracheal aspiration noted without cough reflex, mild GERD. Overnight pulse oximetry 08/18/2020: No evidence of significant nocturnal  desaturation. PFTs 08/31/2020: FEV1 2.16 L or 102% predicted, FVC 2.30 L or 80% predicted, FEV1 FVC 94% predicted, no bronchodilator response.  TLC 4.46 L or 78% predicted, DLCO 37% predicted. QuantiFERON gold 03/12/2021: Negative. CT high-res 03/24/2021: Progressively worsening interstitial lung disease likely UIP.  Markedly worsening bronchiectasis in the right lower lobe with acute airspace consolidation.  Pneumonia considered.  Trace partially loculated right pleural effusion. PFTs 03/25/2021: FEV1 1.38 L or 66% predicted, FVC 1.90 L or 67% predicted FEV1/FVC 73% no bronchodilator response.  Air-trapping noted.  Overall worsening on FEV1 from prior likely related to acute illness diffusion capacity severely reduced. Scleroderma panel 04/01/2021: Negative Chest CT without contrast 05/05/2021: No significant change in pulmonary fibrosis bronchiectasis and areas of honeycombing at the lung bases.  Are consistent with UIP.  The previously noted infiltrate has resolved. PFTs 06/02/2021: FEV1 1.59 L or 76% predicted, FVC 2.08 L or 74% predicted, FEV1/FVC 76%, no bronchodilator response.  Lung volumes normal, improvement from prior PFTs noted.  Diffusion capacity severely reduced however improved from prior. 2D echocardiogram 07/09/2021: LVEF 60 to 65%, grade 2 DD, mildly elevated pulmonary artery systolic pressure, RV systolic pressure 40.9 mmHg mild aortic sclerosis without stenosis.  Stable from prior. Swallow evaluation 07/10/2021: Normal swallow function.  No laryngeal penetration or tracheal aspiration. Chest x-ray PA and lateral 01/14/2022: Findings consistent with chronic pulmonary fibrosis and senescent changes, small right pleural effusion, 10 mm nodular density in the right perihilar region unchanged from 2020 CT chest. Chest x-ray PA and lateral 16 June 2023: Pulmonary fibrosis, pleural scarring on right base, unchanged.  No acute abnormality of the lungs.  Review of Systems A 10 point  review of systems was performed and it is as noted above otherwise negative.   Patient Active Problem List   Diagnosis Date Noted   Fever 06/15/2023  Elevated brain natriuretic peptide (BNP) level 01/29/2022   Edema of right lower extremity 01/29/2022   Diastolic dysfunction with acute on chronic heart failure (HCC) 01/29/2022   Baker's cyst, right 01/14/2022   Asymptomatic varicose veins of both lower extremities 01/14/2022   DOE (dyspnea on exertion) 01/14/2022   Compression fracture of thoracic spine, non-traumatic (HCC) 11/26/2021   Tremor of both hands 11/26/2021   B12 deficiency 09/02/2020   Anemia 08/06/2020   TMJ (temporomandibular joint syndrome) 03/02/2018   RA (rheumatoid arthritis) (HCC) 07/13/2017   Polyarthralgia 04/25/2017   ILD (interstitial lung disease) (HCC) 06/18/2016   IPF (idiopathic pulmonary fibrosis) (HCC) 03/23/2016   Bronchiectasis without complication (HCC) 03/23/2016   Allergic rhinitis 03/22/2016   Anal fissure 08/18/2015   Insomnia 08/18/2015   Osteoporosis 02/13/2015   Stress due to illness of family member 09/16/2014   Hiatal hernia 10/13/2012   GERD (gastroesophageal reflux disease)    Allergy     Social History   Tobacco Use   Smoking status: Former    Current packs/day: 0.00    Average packs/day: 0.3 packs/day for 20.0 years (5.0 ttl pk-yrs)    Types: Cigarettes    Start date: 01/16/1961    Quit date: 01/16/1981    Years since quitting: 42.5   Smokeless tobacco: Never  Substance Use Topics   Alcohol use: Yes    Alcohol/week: 7.0 standard drinks of alcohol    Types: 7 Standard drinks or equivalent per week    No Known Allergies  Current Meds  Medication Sig   azaTHIOprine (IMURAN) 50 MG tablet TAKE ONE AND ONE-HALF TABLETS (75 MG) EVERY MORNING AND 1 TABLET (50 MG) EVERY EVENING   azithromycin (ZITHROMAX) 250 MG tablet Take 1 tablet 3x a week   cyclobenzaprine (FLEXERIL) 5 MG tablet Take 5 mg by mouth at bedtime.   denosumab  (PROLIA) 60 MG/ML SOLN injection Inject 60 mg into the skin every 6 (six) months. Administer in upper arm, thigh, or abdomen   gabapentin (NEURONTIN) 100 MG capsule Take 2 capsules (200 mg total) by mouth at bedtime.   montelukast (SINGULAIR) 10 MG tablet Take 1 tablet (10 mg total) by mouth daily.   Multiple Minerals-Vitamins (CALCIUM & VIT D3 BONE HEALTH PO) Take 1 Dose by mouth daily.   pantoprazole (PROTONIX) 20 MG tablet Take 1 tablet (20 mg total) by mouth 2 (two) times daily. For heartburn   predniSONE (DELTASONE) 1 MG tablet Take 2 tablets (2 mg total) by mouth daily with breakfast. (Patient taking differently: Take 3 mg by mouth daily with breakfast.)   propranolol (INDERAL) 10 MG tablet TAKE 1 TABLET TWICE A DAY FOR TREMOR   traZODone (DESYREL) 50 MG tablet Take 1 tablet (50 mg total) by mouth at bedtime as needed for sleep. For sleep.   vitamin B-12 (CYANOCOBALAMIN) 500 MCG tablet Take 500 mcg by mouth daily.    Immunization History  Administered Date(s) Administered   Fluad Quad(high Dose 65+) 07/24/2019, 07/04/2022, 07/28/2023   Influenza Split 06/04/2012   Influenza, High Dose Seasonal PF 09/10/2016   Influenza,inj,Quad PF,6+ Mos 07/04/2013, 08/18/2015, 07/13/2017, 06/19/2018   Influenza-Unspecified 07/15/2020   Moderna SARS-COV2 Booster Vaccination 05/19/2020, 02/19/2021   Moderna Sars-Covid-2 Vaccination 10/22/2019, 11/19/2019, 04/02/2022   Pneumococcal Conjugate-13 10/06/2011   Pneumococcal Polysaccharide-23 06/19/2018   Zoster, Live 01/14/2007        Objective:     BP 110/62 (BP Location: Right Arm, Cuff Size: Normal)   Pulse 82   SpO2 96%   SpO2: 96 %  O2 Device: None (Room air)  GENERAL: Chronically ill-appearing, thin, well-developed woman in no acute distress.  Fully ambulatory.  She is actually quite spry.  She is chronically pale. HEAD: Normocephalic, atraumatic. EYES: Pupils equal, round, reactive to light.  No scleral icterus. MOUTH: Dentition intact,  some teeth in poor repair.  Oral mucosa moist.  No thrush. NECK: Supple. No thyromegaly. Trachea midline. No JVD.  No adenopathy. PULMONARY: Good air entry bilaterally.  Coarse breath sounds at the bases,crackles on the right base, otherwise no adventitious sounds.   CARDIOVASCULAR: S1 and S2. Regular rate and rhythm.  No rubs, murmurs or gallops heard.   ABDOMEN: Nondistended, scaphoid.   MUSCULOSKELETAL: Mild rheumatoid arthritis changes from hands, no active synovitis, no clubbing, no edema. NEUROLOGIC: No overt focal deficit, no gait disturbance, speech is fluent. SKIN: Intact,warm,dry. PSYCH: Mood and behavior are normal.     Assessment & Plan:     ICD-10-CM   1. ILD (interstitial lung disease) (HCC)  J84.9     2. Bronchiectasis without complication (HCC)  J47.9     3. Chronic cough  R05.3     4. Esophageal dysmotility  K22.4     5. Rheumatoid arthritis involving multiple sites with positive rheumatoid factor (HCC)  M05.79     6. Grade II diastolic dysfunction  I51.89      Discussion:    Presbyphonia Age-related voice changes with creakiness. Discussed potential benefits of voice therapy and exercises such as harmonica playing to strengthen laryngeal muscles. -Consider voice therapy if symptoms persist or worsen.  Rheumatoid Arthritis Patient reports dissatisfaction with current rheumatologist and treatment. Recent trials of Orencia and Simponi Aria infusions led to severe fatigue. Currently on Imuran and low-dose prednisone. -Continue current regimen of Imuran and prednisone. -Consider seeking a second opinion from a different rheumatologist, potentially at [facility].  Vertigo Significant symptom burden, affecting daily activities and quality of life. Currently receiving physical therapy. -Continue physical therapy. -Consider neurology referral if symptoms persist or worsen.  Pulmonary Fibrosis and Bronchiectasis Stable, with productive cough particularly in the mornings  and evenings. Continues on azithromycin three times a week. -Continue azithromycin 3 times per week. -Return in three months for follow-up, or sooner if shortness of breath or cough worsens.  General Health Maintenance -Continue B12 supplementation to prevent further decrease in levels. -Ensure up-to-date with vaccinations, including flu.     Will see the patient in follow-up in 3 months time she is to contact us prior to that time should any new difficulties arise.  Gailen Shelter, MD Advanced Bronchoscopy P[facility]    *This note was generated using voice recognition software/Dragon and/or AI transcription program.  Despite best efforts to proofread, errors can occur which can change the meaning. Any transcriptional errors that result from this process are unintentional and may not be fully corrected at the time of dictation.

## 2023-08-05 DIAGNOSIS — R42 Dizziness and giddiness: Secondary | ICD-10-CM | POA: Diagnosis not present

## 2023-08-08 DIAGNOSIS — R42 Dizziness and giddiness: Secondary | ICD-10-CM | POA: Diagnosis not present

## 2023-08-12 DIAGNOSIS — R42 Dizziness and giddiness: Secondary | ICD-10-CM | POA: Diagnosis not present

## 2023-08-15 DIAGNOSIS — R42 Dizziness and giddiness: Secondary | ICD-10-CM | POA: Diagnosis not present

## 2023-08-18 DIAGNOSIS — R42 Dizziness and giddiness: Secondary | ICD-10-CM | POA: Diagnosis not present

## 2023-08-22 DIAGNOSIS — R42 Dizziness and giddiness: Secondary | ICD-10-CM | POA: Diagnosis not present

## 2023-08-29 DIAGNOSIS — R42 Dizziness and giddiness: Secondary | ICD-10-CM | POA: Diagnosis not present

## 2023-09-06 DIAGNOSIS — R42 Dizziness and giddiness: Secondary | ICD-10-CM | POA: Diagnosis not present

## 2023-09-08 DIAGNOSIS — R42 Dizziness and giddiness: Secondary | ICD-10-CM | POA: Diagnosis not present

## 2023-09-14 DIAGNOSIS — R42 Dizziness and giddiness: Secondary | ICD-10-CM | POA: Diagnosis not present

## 2023-09-15 DIAGNOSIS — R42 Dizziness and giddiness: Secondary | ICD-10-CM | POA: Diagnosis not present

## 2023-09-20 DIAGNOSIS — R42 Dizziness and giddiness: Secondary | ICD-10-CM | POA: Diagnosis not present

## 2023-09-23 DIAGNOSIS — R42 Dizziness and giddiness: Secondary | ICD-10-CM | POA: Diagnosis not present

## 2023-09-29 ENCOUNTER — Encounter: Payer: Self-pay | Admitting: Pulmonary Disease

## 2023-09-29 MED ORDER — MONTELUKAST SODIUM 10 MG PO TABS: 10.0000 mg | ORAL_TABLET | Freq: Every day | ORAL | 4 refills | Status: DC

## 2023-09-29 NOTE — Telephone Encounter (Signed)
Dr. Jayme Cloud, please advise if okay to order? Medication not listed within last note

## 2023-09-29 NOTE — Telephone Encounter (Signed)
Yes, OK to refill. Montelukast 10 mg. 1 tab daily. 3 mo. Supply, 4 refills.

## 2023-10-07 ENCOUNTER — Inpatient Hospital Stay: Payer: Medicare Other | Attending: Oncology

## 2023-10-07 DIAGNOSIS — Z79899 Other long term (current) drug therapy: Secondary | ICD-10-CM | POA: Insufficient documentation

## 2023-10-07 DIAGNOSIS — D638 Anemia in other chronic diseases classified elsewhere: Secondary | ICD-10-CM | POA: Diagnosis not present

## 2023-10-07 DIAGNOSIS — E538 Deficiency of other specified B group vitamins: Secondary | ICD-10-CM | POA: Insufficient documentation

## 2023-10-07 LAB — CBC
HCT: 32.6 % — ABNORMAL LOW (ref 36.0–46.0)
Hemoglobin: 10.7 g/dL — ABNORMAL LOW (ref 12.0–15.0)
MCH: 36.4 pg — ABNORMAL HIGH (ref 26.0–34.0)
MCHC: 32.8 g/dL (ref 30.0–36.0)
MCV: 110.9 fL — ABNORMAL HIGH (ref 80.0–100.0)
Platelets: 323 10*3/uL (ref 150–400)
RBC: 2.94 MIL/uL — ABNORMAL LOW (ref 3.87–5.11)
RDW: 14.3 % (ref 11.5–15.5)
WBC: 6.2 10*3/uL (ref 4.0–10.5)
nRBC: 0 % (ref 0.0–0.2)

## 2023-10-07 LAB — IRON AND TIBC
Iron: 97 ug/dL (ref 28–170)
Saturation Ratios: 40 % — ABNORMAL HIGH (ref 10.4–31.8)
TIBC: 242 ug/dL — ABNORMAL LOW (ref 250–450)
UIBC: 145 ug/dL

## 2023-10-07 LAB — FERRITIN: Ferritin: 218 ng/mL (ref 11–307)

## 2023-10-07 LAB — VITAMIN B12: Vitamin B-12: 482 pg/mL (ref 180–914)

## 2023-11-04 ENCOUNTER — Encounter: Payer: Self-pay | Admitting: Pulmonary Disease

## 2023-11-04 ENCOUNTER — Ambulatory Visit: Payer: Medicare Other | Admitting: Pulmonary Disease

## 2023-11-04 ENCOUNTER — Ambulatory Visit
Admission: RE | Admit: 2023-11-04 | Discharge: 2023-11-04 | Disposition: A | Discharge status: Home / Self Care | Payer: Medicare Other | Source: Ambulatory Visit | Attending: Pulmonary Disease | Admitting: Pulmonary Disease

## 2023-11-04 ENCOUNTER — Ambulatory Visit
Admission: RE | Admit: 2023-11-04 | Discharge: 2023-11-04 | Disposition: A | Discharge status: Home / Self Care | Payer: Medicare Other | Attending: Pulmonary Disease | Admitting: Pulmonary Disease

## 2023-11-04 VITALS — BP 120/78 | HR 83 | Temp 96.9°F | Ht 68.0 in | Wt 106.6 lb

## 2023-11-04 DIAGNOSIS — M0579 Rheumatoid arthritis with rheumatoid factor of multiple sites without organ or systems involvement: Secondary | ICD-10-CM

## 2023-11-04 DIAGNOSIS — R053 Chronic cough: Secondary | ICD-10-CM | POA: Diagnosis not present

## 2023-11-04 DIAGNOSIS — K219 Gastro-esophageal reflux disease without esophagitis: Secondary | ICD-10-CM | POA: Diagnosis not present

## 2023-11-04 DIAGNOSIS — J849 Interstitial pulmonary disease, unspecified: Secondary | ICD-10-CM

## 2023-11-04 DIAGNOSIS — K224 Dyskinesia of esophagus: Secondary | ICD-10-CM

## 2023-11-04 DIAGNOSIS — I5189 Other ill-defined heart diseases: Secondary | ICD-10-CM

## 2023-11-04 DIAGNOSIS — J479 Bronchiectasis, uncomplicated: Secondary | ICD-10-CM

## 2023-11-04 DIAGNOSIS — J984 Other disorders of lung: Secondary | ICD-10-CM | POA: Diagnosis not present

## 2023-11-04 DIAGNOSIS — I700 Atherosclerosis of aorta: Secondary | ICD-10-CM | POA: Diagnosis not present

## 2023-11-04 MED ORDER — ESOMEPRAZOLE MAGNESIUM 20 MG PO CPDR: 20.0000 mg | DELAYED_RELEASE_CAPSULE | Freq: Two times a day (BID) | ORAL | 6 refills | Status: DC

## 2023-11-04 NOTE — Progress Notes (Signed)
Subjective:    Patient ID: Crystal Gregory, female    DOB: [birth year], 89 y.o.   MRN: [id number]  Patient Care Team: Doreene Nest, NP as PCP - General (Internal Medicine) Antonieta Iba, MD as PCP - Cardiology (Cardiology) Antonieta Iba, MD as Consulting Physician (Cardiology) Pollyann Savoy, MD as Consulting Physician (Rheumatology) Salena Saner, MD as Consulting Physician (Pulmonary Disease)  Chief Complaint  Patient presents with   Follow-up    Cough with green sputum. DOE. No wheezing.     BACKGROUND/INTERVAL: 89 y.o. with remote minimal smoking history (15 PY, quit 1982) initially evaluated by Dr Dema Severin 03/2016 and subsequently by Dr Marchelle Gearing for pulmonary fibrosis. Pt has prior history of severe ARDS (1999) and had been on chronic nitrofurantoin for recurrent UTIs. She has been tried on 2 maintenance inhalers without discernible benefit.  Since her prior visit on 04 August 2023 has not had any exacerbations.   PROBLEMS: History of prior ARDS 1999 Prior long term nitrofurantoin therapy Pulmonary fibrosis Bronchiectasis Mild COPD Rheumatoid arthritis (RF/CCP positive) Dysphagia with chronic silent aspiration  HPI Discussed the use of AI scribe software for clinical note transcription with the patient, who gave verbal consent to proceed.  History of Present Illness   The patient, with bronchiectasis and pulmonary fibrosis, presents with a chronic productive cough.  She has a chronic productive cough that has increased in frequency, with no known triggers, and causes difficulty breathing, especially when bending down. The sputum is described as 'ugly green' and sometimes brown. She manages the cough by going to the nearest bathroom to expel the sputum. She is currently taking azithromycin three times a week to prevent pneumonia and has recently increased her prednisone dosage.  Prednisone is for rheumatoid arthritis and not for pulmonary issues.  She  experiences issues with reflux, leading her to eat smaller amounts of food, such as one slice of toast with cheese for breakfast instead of two.  She has significant esophageal dysmotility.  She continues to take Protonix twice a day, a regimen she has been on for years.  She expresses concern about her hands, which are affecting her ability to play the piano and organ, activities she used to enjoy. She mentions experiencing bone pain, particularly in the humerus, which she attributes to her rheumatoid arthritis. She has not seen an orthopedic specialist for this issue.  She does follow with rheumatology.  She is cautious about exposure to illnesses due to her immunosuppressive medication, Imuran, which affects her white blood cell count.  She has also has macrocytic anemia associated with the Imuran.  Overall she feels that she is "holding her own".  She is very frail and lives in assisted living.  She does have a daughter who is attentive and comes to visit her often.    DATA: CT chest 03/22/16: Generalized pulmonary hyperinflation but without bullous emphysema. Widespread bronchiectasis with bronchial wall thickening consistent with inflammatory bronchitis. Areas of pulmonary fibrosis, most pronounced in the lower lobes, where the bronchiectasis is more extensive, including areas of pulmonary lung destruction HRCT chest 06/11/16: Diffuse cylindrical and varicoid bronchiectasis throughout both lungs, most severe at the lung bases. Basilar predominant fibrotic interstitial lung disease with patchy honeycombing, with mild progression in the short interval since 03/22/2016 Spirometry 06/18/16: Mild obstruction, FEV1 1.42 liters (63%), FVC 2.43 (80%) Bronchoscopy 07/05/16: Normal airway exam. BAL negative for AFB Echocardiogram 10/07/15: LVEF 60%, Grade I DD, mild MR, RVSP estimate 35 mmHg HRCT 11/04/16: again compatible with interstitial  lung disease, and considered diagnostic of usual interstitial  pneumonia (UIP) from an imaging standpoint. There has been no significant progression of disease compared to the recent prior examination PFT 02/10/17: no obstruction, normal TLC, moderate reduction in DLCO 03/11/17: 345 m. No desaturation PFTs 11/22/17: No obstruction.  Lung volumes normal.  DLCO moderately reduced (55% predicted). NSC since 02/10/17 PFTs 11/23/18 : FVC: 2.67 L (95 %pred), FEV1: 2.35 L (112 %pred), FEV1/FVC: 88%, TLC: 4.98 L (88 %pred), DLCO 42 %pred 2D echo 11/16/2019:Left ventricular ejection fraction, by estimation, is 60 to 65%. Left ventricular diastolic parameters were normal.Right ventricular systolic function is normal. The right ventricular size is normal. Mildly elevated pulmonary artery systolic pressure. The estimated right ventricular systolic pressure is 39.4 mmHg. Left atrial size was mildly dilated.Tricuspid valve regurgitation is mild to moderate CT chest 02/15/2020:The appearance of the lungs is compatible with interstitial lung disease, with a spectrum of findings considered diagnostic of usual interstitial pneumonia (UIP) per current ATS guidelines. Minimal progression compared to the prior examination. Barium swallow study 03/05/2020: Tertiary contractions of the esophagus mild spasm, one episode of tracheal aspiration noted without cough reflex, mild GERD. Overnight pulse oximetry 08/18/2020: No evidence of significant nocturnal desaturation. PFTs 08/31/2020: FEV1 2.16 L or 102% predicted, FVC 2.30 L or 80% predicted, FEV1 FVC 94% predicted, no bronchodilator response.  TLC 4.46 L or 78% predicted, DLCO 37% predicted. QuantiFERON gold 03/12/2021: Negative. CT high-res 03/24/2021: Progressively worsening interstitial lung disease likely UIP.  Markedly worsening bronchiectasis in the right lower lobe with acute airspace consolidation.  Pneumonia considered.  Trace partially loculated right pleural effusion. PFTs 03/25/2021: FEV1 1.38 L or 66% predicted, FVC 1.90  L or 67% predicted FEV1/FVC 73% no bronchodilator response.  Air-trapping noted.  Overall worsening on FEV1 from prior likely related to acute illness diffusion capacity severely reduced. Scleroderma panel 04/01/2021: Negative Chest CT without contrast 05/05/2021: No significant change in pulmonary fibrosis bronchiectasis and areas of honeycombing at the lung bases.  Are consistent with UIP.  The previously noted infiltrate has resolved. PFTs 06/02/2021: FEV1 1.59 L or 76% predicted, FVC 2.08 L or 74% predicted, FEV1/FVC 76%, no bronchodilator response.  Lung volumes normal, improvement from prior PFTs noted.  Diffusion capacity severely reduced however improved from prior. 2D echocardiogram 07/09/2021: LVEF 60 to 65%, grade 2 DD, mildly elevated pulmonary artery systolic pressure, RV systolic pressure 40.9 mmHg mild aortic sclerosis without stenosis.  Stable from prior. Swallow evaluation 07/10/2021: Normal swallow function.  No laryngeal penetration or tracheal aspiration. Chest x-ray PA and lateral 01/14/2022: Findings consistent with chronic pulmonary fibrosis and senescent changes, small right pleural effusion, 10 mm nodular density in the right perihilar region unchanged from 2020 CT chest. Chest x-ray PA and lateral 16 June 2023: Pulmonary fibrosis, pleural scarring on right base, unchanged.  No acute abnormality of the lungs.  Review of Systems A 10 point review of systems was performed and it is as noted above otherwise negative.   Patient Active Problem List   Diagnosis Date Noted   Fever 06/15/2023   Elevated brain natriuretic peptide (BNP) level 01/29/2022   Edema of right lower extremity 01/29/2022   Diastolic dysfunction with acute on chronic heart failure (HCC) 01/29/2022   Baker's cyst, right 01/14/2022   Asymptomatic varicose veins of both lower extremities 01/14/2022   DOE (dyspnea on exertion) 01/14/2022   Compression fracture of thoracic spine, non-traumatic (HCC)  11/26/2021   Tremor of both hands 11/26/2021   B12 deficiency 09/02/2020  Anemia 08/06/2020   TMJ (temporomandibular joint syndrome) 03/02/2018   RA (rheumatoid arthritis) (HCC) 07/13/2017   Polyarthralgia 04/25/2017   ILD (interstitial lung disease) (HCC) 06/18/2016   IPF (idiopathic pulmonary fibrosis) (HCC) 03/23/2016   Bronchiectasis without complication (HCC) 03/23/2016   Allergic rhinitis 03/22/2016   Anal fissure 08/18/2015   Insomnia 08/18/2015   Osteoporosis 02/13/2015   Stress due to illness of family member 09/16/2014   Hiatal hernia 10/13/2012   GERD (gastroesophageal reflux disease)    Allergy     Social History   Tobacco Use   Smoking status: Former    Current packs/day: 0.00    Average packs/day: 0.3 packs/day for 20.0 years (5.0 ttl pk-yrs)    Types: Cigarettes    Start date: 01/16/1961    Quit date: 01/16/1981    Years since quitting: 42.8   Smokeless tobacco: Never  Substance Use Topics   Alcohol use: Yes    Alcohol/week: 7.0 standard drinks of alcohol    Types: 7 Standard drinks or equivalent per week    No Known Allergies  Current Meds  Medication Sig   azaTHIOprine (IMURAN) 50 MG tablet TAKE ONE AND ONE-HALF TABLETS (75 MG) EVERY MORNING AND 1 TABLET (50 MG) EVERY EVENING   azithromycin (ZITHROMAX) 250 MG tablet Take 1 tablet 3x a week   cyclobenzaprine (FLEXERIL) 5 MG tablet Take 5 mg by mouth at bedtime.   denosumab (PROLIA) 60 MG/ML SOLN injection Inject 60 mg into the skin every 6 (six) months. Administer in upper arm, thigh, or abdomen   gabapentin (NEURONTIN) 100 MG capsule Take 2 capsules (200 mg total) by mouth at bedtime.   montelukast (SINGULAIR) 10 MG tablet Take 1 tablet (10 mg total) by mouth daily.   Multiple Minerals-Vitamins (CALCIUM & VIT D3 BONE HEALTH PO) Take 1 Dose by mouth daily.   pantoprazole (PROTONIX) 20 MG tablet Take 1 tablet (20 mg total) by mouth 2 (two) times daily. For heartburn   predniSONE (DELTASONE) 1 MG tablet  Take 2 tablets (2 mg total) by mouth daily with breakfast. (Patient taking differently: Take 3 mg by mouth daily with breakfast.)   propranolol (INDERAL) 10 MG tablet TAKE 1 TABLET TWICE A DAY FOR TREMOR   traZODone (DESYREL) 50 MG tablet Take 1 tablet (50 mg total) by mouth at bedtime as needed for sleep. For sleep.   vitamin B-12 (CYANOCOBALAMIN) 500 MCG tablet Take 500 mcg by mouth daily.    Immunization History  Administered Date(s) Administered   Fluad Quad(high Dose 65+) 07/24/2019, 07/04/2022, 07/28/2023   Influenza Split 06/04/2012   Influenza, High Dose Seasonal PF 09/10/2016   Influenza,inj,Quad PF,6+ Mos 07/04/2013, 08/18/2015, 07/13/2017, 06/19/2018   Influenza-Unspecified 07/15/2020   Moderna SARS-COV2 Booster Vaccination 05/19/2020, 02/19/2021   Moderna Sars-Covid-2 Vaccination 10/22/2019, 11/19/2019, 04/02/2022   Pneumococcal Conjugate-13 10/06/2011   Pneumococcal Polysaccharide-23 06/19/2018   Zoster, Live 01/14/2007      Objective:     BP 120/78 (BP Location: Left Arm, Cuff Size: Normal)   Pulse 83   Temp (!) 96.9 F (36.1 C)   Ht 5\' 8"  (1.727 m)   Wt 106 lb 9.6 oz (48.4 kg)   SpO2 100%   BMI 16.21 kg/m   SpO2: 100 % O2 Device: None (Room air)  GENERAL: Chronically ill-appearing, thin, well-developed woman in no acute distress.  Fully ambulatory.  She is actually quite spry.  She is chronically pale. HEAD: Normocephalic, atraumatic. EYES: Pupils equal, round, reactive to light.  No scleral icterus. MOUTH: Dentition  intact, some teeth in poor repair.  Oral mucosa moist.  No thrush. NECK: Supple. No thyromegaly. Trachea midline. No JVD.  No adenopathy. PULMONARY: Good air entry bilaterally.  Coarse breath sounds at the bases,crackles on the right base, otherwise no adventitious sounds.   CARDIOVASCULAR: S1 and S2. Regular rate and rhythm.  No rubs, murmurs or gallops heard.   ABDOMEN: Nondistended, scaphoid.   MUSCULOSKELETAL: Mild rheumatoid arthritis  changes from hands, no active synovitis, no clubbing, no edema. NEUROLOGIC: No overt focal deficit, no gait disturbance, speech is fluent. SKIN: Intact,warm,dry. PSYCH: Mood and behavior are normal.   Assessment & Plan:     ICD-10-CM   1. Chronic cough  R05.3 DG Chest 2 View    2. Bronchiectasis without complication (HCC)  J47.9     3. ILD (interstitial lung disease) (HCC)  J84.9     4. Rheumatoid arthritis involving multiple sites with positive rheumatoid factor (HCC)  M05.79     5. Grade II diastolic dysfunction  I51.89     6. Esophageal dysmotility  K22.4     7. Gastroesophageal reflux disease, unspecified whether esophagitis present  K21.9       Orders Placed This Encounter  Procedures   DG Chest 2 View    Standing Status:   Future    Number of Occurrences:   1    Expected Date:   11/04/2023    Expiration Date:   11/03/2024    Reason for Exam (SYMPTOM  OR DIAGNOSIS REQUIRED):   Worsening Cough, R/O Pneumonia    Preferred imaging location?:   [facility]   Meds ordered this encounter  Medications   esomeprazole (NEXIUM) 20 MG capsule    Sig: Take 1 capsule (20 mg total) by mouth 2 (two) times daily before a meal.    Dispense:  60 capsule    Refill:  6      Discussion:    Bronchiectasis   Chronic condition with increased frequency of productive cough. Sputum is green and brown, indicating possible infection. Reports difficulty breathing during coughing episodes, especially when bending down. Discussed the use of Acapella valve and vest therapy for airway clearance, highlighting her potential benefits in reducing mucus buildup and improving breathing. Explained that if the Acapella valve is ineffective, vest therapy may be considered.   - Order chest x-ray at [facility] outpatient to rule out pneumonia   - Recommend use of Acapella valve for airway clearance   - Consider vest therapy if Acapella valve is ineffective   - Follow-up in 4-6 weeks to assess  effectiveness of airway clearance techniques    Pulmonary Fibrosis   Chronic condition with ongoing management. No new acute symptoms reported. Discussed the importance of monitoring for any new symptoms and maintaining current management strategies.   - Continue current management and monitor for any new symptoms    Gastroesophageal Reflux Disease (GERD)/Esophageal Dysmotility Long-standing GERD with reduced food intake and need to prop up in bed. Currently on Protonix (pantoprazole) for 20 years, twice daily. Symptoms suggest possible tolerance to current medication. Discussed the need to change reflux medication and the potential benefits of finding a more effective treatment. Explained that the new medication will be chosen based on insurance coverage.   - Change reflux medication based on insurance coverage   - Monitor for symptom improvement and adjust treatment as necessary    Rheumatoid Arthritis   Chronic condition with significant impact on hand function. Reports dissatisfaction with current management and increased pain in  hands. Discussed the importance of reassessment by a rheumatologist to optimize treatment.   - Continue current management and consider referral to rheumatologist for reassessment of treatment plan    Suspected Multiple Myeloma   Reports bone pain in the humerus, not related to joint or muscle. No current follow-up with rheumatology. Discussed the need for further evaluation to rule out multiple myeloma or other bone-related conditions.   - Recommend follow-up with rheumatology for issues with ongoing bone pain - Evaluation for possible multiple myeloma by hematology/oncology  General Health Maintenance   Careful to avoid exposure to infections due to immunosuppressive therapy (Imuran).   - Continue to avoid exposure to infections   - Monitor red blood cell count due to Imuran    Follow-up   - Follow-up in 4-6 weeks to assess effectiveness of airway clearance  techniques and new reflux medication   - Immediate follow-up if cough worsens or new symptoms develop.      Advised if symptoms do not improve or worsen, to please contact office for sooner follow up or seek emergency care.    I spent 32 minutes of dedicated to the care of this patient on the date of this encounter to include pre-visit review of records, face-to-face time with the patient discussing conditions above, post visit ordering of testing, clinical documentation with the electronic health record, making appropriate referrals as documented, and communicating necessary findings to members of the patients care team.     C. Danice Goltz, MD Advanced Bronchoscopy PCCM [facility]    *This note was generated using voice recognition software/Dragon and/or AI transcription program.  Despite best efforts to proofread, errors can occur which can change the meaning. Any transcriptional errors that result from this process are unintentional and may not be fully corrected at the time of dictation.

## 2023-11-04 NOTE — Patient Instructions (Addendum)
VISIT SUMMARY:  During today's visit, we discussed your chronic productive cough, issues with reflux, hand pain affecting your ability to play musical instruments, and bone pain in your humerus. We reviewed your current medications and management strategies, and we have made some adjustments to help improve your symptoms and overall health.  YOUR PLAN:  -BRONCHIECTASIS: Bronchiectasis is a chronic condition where the airways in the lungs become widened and scarred, leading to mucus buildup and frequent infections. We recommend using an Acapella valve for airway clearance and will consider vest therapy if the valve is ineffective. A chest x-ray has been ordered to rule out pneumonia.  We will call in appropriate antibiotic therapy if this is the case.  Please follow up in 4-6 weeks to assess the effectiveness of these techniques.  -PULMONARY FIBROSIS: Pulmonary fibrosis is a chronic lung disease that causes scarring of the lung tissue, leading to breathing difficulties. Continue with your current management and monitor for any new symptoms.  -GASTROESOPHAGEAL REFLUX DISEASE (GERD): GERD is a condition where stomach acid frequently flows back into the tube connecting your mouth and stomach, causing discomfort. We will change your reflux medication based on your insurance coverage and monitor for symptom improvement.  -RHEUMATOID ARTHRITIS: Rheumatoid arthritis is an autoimmune disorder that causes joint inflammation and pain. We recommend continuing your current management and continue following with your rheumatologist for reassessment of your treatment plan.  -GENERAL HEALTH MAINTENANCE: Due to your immunosuppressive therapy (Imuran), it is important to avoid exposure to infections and monitor your red blood cell count. Continue to be cautious and follow your current precautions.  INSTRUCTIONS:  Please follow up in 4-6 weeks to assess the effectiveness of the airway clearance techniques and the new  reflux medication. If your cough worsens or new symptoms develop, seek immediate medical attention.

## 2023-11-07 ENCOUNTER — Encounter: Payer: Self-pay | Admitting: Pulmonary Disease

## 2023-11-07 MED ORDER — ESOMEPRAZOLE MAGNESIUM 20 MG PO CPDR: 20.0000 mg | DELAYED_RELEASE_CAPSULE | Freq: Two times a day (BID) | ORAL | 6 refills | Status: DC

## 2023-11-07 MED ORDER — ESOMEPRAZOLE MAGNESIUM 20 MG PO CPDR: 20.0000 mg | DELAYED_RELEASE_CAPSULE | Freq: Two times a day (BID) | ORAL | 3 refills | Status: DC

## 2023-11-10 ENCOUNTER — Encounter: Payer: Self-pay | Admitting: Pulmonary Disease

## 2023-11-10 NOTE — Telephone Encounter (Signed)
 X-ray is unchanged and with no evidence of new pneumonia.  Apologize for the delay.

## 2023-11-16 DIAGNOSIS — G250 Essential tremor: Secondary | ICD-10-CM

## 2023-11-16 MED ORDER — PROPRANOLOL HCL 10 MG PO TABS: ORAL_TABLET | ORAL | 0 refills | Status: DC

## 2023-11-18 ENCOUNTER — Encounter: Payer: Self-pay | Admitting: Pulmonary Disease

## 2023-11-22 DIAGNOSIS — M65331 Trigger finger, right middle finger: Secondary | ICD-10-CM | POA: Diagnosis not present

## 2023-11-22 DIAGNOSIS — M47812 Spondylosis without myelopathy or radiculopathy, cervical region: Secondary | ICD-10-CM | POA: Diagnosis not present

## 2023-11-22 DIAGNOSIS — M810 Age-related osteoporosis without current pathological fracture: Secondary | ICD-10-CM | POA: Diagnosis not present

## 2023-11-22 DIAGNOSIS — Z79899 Other long term (current) drug therapy: Secondary | ICD-10-CM | POA: Diagnosis not present

## 2023-11-22 DIAGNOSIS — M0579 Rheumatoid arthritis with rheumatoid factor of multiple sites without organ or systems involvement: Secondary | ICD-10-CM | POA: Diagnosis not present

## 2023-11-22 DIAGNOSIS — J849 Interstitial pulmonary disease, unspecified: Secondary | ICD-10-CM | POA: Diagnosis not present

## 2023-12-16 ENCOUNTER — Encounter: Payer: Self-pay | Admitting: Pulmonary Disease

## 2023-12-16 ENCOUNTER — Ambulatory Visit: Payer: Medicare Other | Admitting: Pulmonary Disease

## 2023-12-16 VITALS — BP 108/78 | HR 86 | Temp 96.9°F | Ht 68.0 in | Wt 107.2 lb

## 2023-12-16 DIAGNOSIS — R0602 Shortness of breath: Secondary | ICD-10-CM

## 2023-12-16 DIAGNOSIS — K224 Dyskinesia of esophagus: Secondary | ICD-10-CM | POA: Diagnosis not present

## 2023-12-16 DIAGNOSIS — I5189 Other ill-defined heart diseases: Secondary | ICD-10-CM

## 2023-12-16 DIAGNOSIS — J479 Bronchiectasis, uncomplicated: Secondary | ICD-10-CM | POA: Diagnosis not present

## 2023-12-16 DIAGNOSIS — K219 Gastro-esophageal reflux disease without esophagitis: Secondary | ICD-10-CM

## 2023-12-16 DIAGNOSIS — M0579 Rheumatoid arthritis with rheumatoid factor of multiple sites without organ or systems involvement: Secondary | ICD-10-CM

## 2023-12-16 DIAGNOSIS — R053 Chronic cough: Secondary | ICD-10-CM | POA: Diagnosis not present

## 2023-12-16 DIAGNOSIS — J849 Interstitial pulmonary disease, unspecified: Secondary | ICD-10-CM

## 2023-12-16 DIAGNOSIS — F4321 Adjustment disorder with depressed mood: Secondary | ICD-10-CM

## 2023-12-16 NOTE — Progress Notes (Signed)
 Subjective:    Patient ID: Crystal Gregory, female    DOB: 02-20-1932, 89 y.o.   MRN: [id number]  Patient Care Team: Doreene Nest, NP as PCP - General (Internal Medicine) Antonieta Iba, MD as PCP - Cardiology (Cardiology) Antonieta Iba, MD as Consulting Physician (Cardiology) Pollyann Savoy, MD as Consulting Physician (Rheumatology) Salena Saner, MD as Consulting Physician (Pulmonary Disease)  Chief Complaint  Patient presents with   Follow-up    Increased DOE. Cough with green. No wheezing.     BACKGROUND/INTERVAL: 89 y.o. with remote minimal smoking history (15 PY, quit 1982) initially evaluated by Dr Dema Severin 03/2016 and subsequently by Dr Marchelle Gearing for pulmonary fibrosis. Pt has prior history of severe ARDS (1999) and had been on chronic nitrofurantoin for recurrent UTIs. She has been tried on 2 maintenance inhalers without discernible benefit.  Since her prior visit on 04 November 2023 she has not had any exacerbations.  At her prior visit she was darted on Nexium twice a day due to significant gastroesophageal reflux related to esophageal dysmotility   PROBLEMS: History of prior ARDS 1999 Prior long term nitrofurantoin therapy Pulmonary fibrosis Bronchiectasis Mild COPD Rheumatoid arthritis (RF/CCP positive) Dysphagia with chronic silent aspiration  HPI Discussed the use of AI scribe software for clinical note transcription with the patient, who gave verbal consent to proceed.  History of Present Illness   Crystal Gregory is an 89 year old female with pulmonary fibrosis and bronchiectasis who presents with increased shortness of breath and cough.  She experiences increased shortness of breath and more frequent coughing, which she attributes to her pulmonary fibrosis. She feels that her physical decline is partly due to emotional stress following the loss of her husband almost a year ago. She is aware of the chronic nature of her condition and its impact on  her energy levels and daily schedule.   She has been taking Nexium for over a month, one hour before breakfast and dinner, which has significantly improved her symptoms. She continues to eat small amounts slowly due to esophageal motility issues and hiatal hernia, she believes that the medication has been effective in managing her GERD symptoms.  A week ago, she sustained a leg injury when her daughter's dog accidentally ran into her leg. The injury did not break the skin but remains sensitive and painful, especially at night. She applied ice immediately after the incident and has not had an x-ray, as she does not believe it is broken.  On exam there is no crepitus over the area, she has hematoma that is healing.  She participates in chair yoga twice a week, which she finds challenging but beneficial. Due to her inability to get up off the floor by herself, she opts for chair yoga, which she feels provides a good workout. She appreciates the knowledgeable instructors and finds the sessions helpful for her physical health.     DATA: CT chest 03/22/16: Generalized pulmonary hyperinflation but without bullous emphysema. Widespread bronchiectasis with bronchial wall thickening consistent with inflammatory bronchitis. Areas of pulmonary fibrosis, most pronounced in the lower lobes, where the bronchiectasis is more extensive, including areas of pulmonary lung destruction HRCT chest 06/11/16: Diffuse cylindrical and varicoid bronchiectasis throughout both lungs, most severe at the lung bases. Basilar predominant fibrotic interstitial lung disease with patchy honeycombing, with mild progression in the short interval since 03/22/2016 Spirometry 06/18/16: Mild obstruction, FEV1 1.42 liters (63%), FVC 2.43 (80%) Bronchoscopy 07/05/16: Normal airway exam. BAL negative for AFB Echocardiogram 10/07/15:  LVEF 60%, Grade I DD, mild MR, RVSP estimate 35 mmHg HRCT 11/04/16: again compatible with interstitial lung  disease, and considered diagnostic of usual interstitial pneumonia (UIP) from an imaging standpoint. There has been no significant progression of disease compared to the recent prior examination PFT 02/10/17: no obstruction, normal TLC, moderate reduction in DLCO 03/11/17: 345 m. No desaturation PFTs 11/22/17: No obstruction.  Lung volumes normal.  DLCO moderately reduced (55% predicted). NSC since 02/10/17 PFTs 11/23/18 : FVC: 2.67 L (95 %pred), FEV1: 2.35 L (112 %pred), FEV1/FVC: 88%, TLC: 4.98 L (88 %pred), DLCO 42 %pred 2D echo 11/16/2019:Left ventricular ejection fraction, by estimation, is 60 to 65%. Left ventricular diastolic parameters were normal.Right ventricular systolic function is normal. The right ventricular size is normal. Mildly elevated pulmonary artery systolic pressure. The estimated right ventricular systolic pressure is 39.4 mmHg. Left atrial size was mildly dilated.Tricuspid valve regurgitation is mild to moderate CT chest 02/15/2020:The appearance of the lungs is compatible with interstitial lung disease, with a spectrum of findings considered diagnostic of usual interstitial pneumonia (UIP) per current ATS guidelines. Minimal progression compared to the prior examination. Barium swallow study 03/05/2020: Tertiary contractions of the esophagus mild spasm, one episode of tracheal aspiration noted without cough reflex, mild GERD. Overnight pulse oximetry 08/18/2020: No evidence of significant nocturnal desaturation. PFTs 08/31/2020: FEV1 2.16 L or 102% predicted, FVC 2.30 L or 80% predicted, FEV1 FVC 94% predicted, no bronchodilator response.  TLC 4.46 L or 78% predicted, DLCO 37% predicted. QuantiFERON gold 03/12/2021: Negative. CT high-res 03/24/2021: Progressively worsening interstitial lung disease likely UIP.  Markedly worsening bronchiectasis in the right lower lobe with acute airspace consolidation.  Pneumonia considered.  Trace partially loculated right pleural  effusion. PFTs 03/25/2021: FEV1 1.38 L or 66% predicted, FVC 1.90 L or 67% predicted FEV1/FVC 73% no bronchodilator response.  Air-trapping noted.  Overall worsening on FEV1 from prior likely related to acute illness diffusion capacity severely reduced. Scleroderma panel 04/01/2021: Negative Chest CT without contrast 05/05/2021: No significant change in pulmonary fibrosis bronchiectasis and areas of honeycombing at the lung bases.  Are consistent with UIP.  The previously noted infiltrate has resolved. PFTs 06/02/2021: FEV1 1.59 L or 76% predicted, FVC 2.08 L or 74% predicted, FEV1/FVC 76%, no bronchodilator response.  Lung volumes normal, improvement from prior PFTs noted.  Diffusion capacity severely reduced however improved from prior. 2D echocardiogram 07/09/2021: LVEF 60 to 65%, grade 2 DD, mildly elevated pulmonary artery systolic pressure, RV systolic pressure 40.9 mmHg mild aortic sclerosis without stenosis.  Stable from prior. Swallow evaluation 07/10/2021: Normal swallow function.  No laryngeal penetration or tracheal aspiration. Chest x-ray PA and lateral 01/14/2022: Findings consistent with chronic pulmonary fibrosis and senescent changes, small right pleural effusion, 10 mm nodular density in the right perihilar region unchanged from 2020 CT chest. Chest x-ray PA and lateral 16 June 2023: Pulmonary fibrosis, pleural scarring on right base, unchanged.  No acute abnormality of the lungs.   Review of Systems A 10 point review of systems was performed and it is as noted above otherwise negative.   Patient Active Problem List   Diagnosis Date Noted   Fever 06/15/2023   Elevated brain natriuretic peptide (BNP) level 01/29/2022   Edema of right lower extremity 01/29/2022   Diastolic dysfunction with acute on chronic heart failure (HCC) 01/29/2022   Baker's cyst, right 01/14/2022   Asymptomatic varicose veins of both lower extremities 01/14/2022   DOE (dyspnea on exertion) 01/14/2022    Compression fracture of thoracic  spine, non-traumatic (HCC) 11/26/2021   Tremor of both hands 11/26/2021   B12 deficiency 09/02/2020   Anemia 08/06/2020   TMJ (temporomandibular joint syndrome) 03/02/2018   RA (rheumatoid arthritis) (HCC) 07/13/2017   Polyarthralgia 04/25/2017   ILD (interstitial lung disease) (HCC) 06/18/2016   IPF (idiopathic pulmonary fibrosis) (HCC) 03/23/2016   Bronchiectasis without complication (HCC) 03/23/2016   Allergic rhinitis 03/22/2016   Anal fissure 08/18/2015   Insomnia 08/18/2015   Osteoporosis 02/13/2015   Stress due to illness of family member 09/16/2014   Hiatal hernia 10/13/2012   GERD (gastroesophageal reflux disease)    Allergy     Social History   Tobacco Use   Smoking status: Former    Current packs/day: 0.00    Average packs/day: 0.3 packs/day for 20.0 years (5.0 ttl pk-yrs)    Types: Cigarettes    Start date: 01/16/1961    Quit date: 01/16/1981    Years since quitting: 42.9   Smokeless tobacco: Never  Substance Use Topics   Alcohol use: Yes    Alcohol/week: 7.0 standard drinks of alcohol    Types: 7 Standard drinks or equivalent per week    No Known Allergies  Current Meds  Medication Sig   azaTHIOprine (IMURAN) 50 MG tablet TAKE ONE AND ONE-HALF TABLETS (75 MG) EVERY MORNING AND 1 TABLET (50 MG) EVERY EVENING   azithromycin (ZITHROMAX) 250 MG tablet Take 1 tablet 3x a week   denosumab (PROLIA) 60 MG/ML SOLN injection Inject 60 mg into the skin every 6 (six) months. Administer in upper arm, thigh, or abdomen   esomeprazole (NEXIUM) 20 MG capsule Take 1 capsule (20 mg total) by mouth 2 (two) times daily before a meal.   gabapentin (NEURONTIN) 100 MG capsule Take 2 capsules (200 mg total) by mouth at bedtime.   montelukast (SINGULAIR) 10 MG tablet Take 1 tablet (10 mg total) by mouth daily.   Multiple Minerals-Vitamins (CALCIUM & VIT D3 BONE HEALTH PO) Take 1 Dose by mouth daily.   predniSONE (DELTASONE) 1 MG tablet Take 2  tablets (2 mg total) by mouth daily with breakfast. (Patient taking differently: Take 3 mg by mouth daily with breakfast.)   propranolol (INDERAL) 10 MG tablet TAKE 1 TABLET TWICE A DAY FOR TREMOR   traZODone (DESYREL) 50 MG tablet Take 1 tablet (50 mg total) by mouth at bedtime as needed for sleep. For sleep.   vitamin B-12 (CYANOCOBALAMIN) 500 MCG tablet Take 500 mcg by mouth daily.    Immunization History  Administered Date(s) Administered   Fluad Quad(high Dose 65+) 07/24/2019, 07/04/2022, 07/28/2023   Influenza Split 06/04/2012   Influenza, High Dose Seasonal PF 09/10/2016   Influenza,inj,Quad PF,6+ Mos 07/04/2013, 08/18/2015, 07/13/2017, 06/19/2018   Influenza-Unspecified 07/15/2020   Moderna SARS-COV2 Booster Vaccination 05/19/2020, 02/19/2021   Moderna Sars-Covid-2 Vaccination 10/22/2019, 11/19/2019, 04/02/2022   Pneumococcal Conjugate-13 10/06/2011   Pneumococcal Polysaccharide-23 06/19/2018   Zoster, Live 01/14/2007        Objective:     BP 108/78 (BP Location: Left Arm, Cuff Size: Normal)   Pulse 86   Temp (!) 96.9 F (36.1 C)   Ht 5\' 8"  (1.727 m)   Wt 107 lb 3.2 oz (48.6 kg)   SpO2 99%   BMI 16.30 kg/m   SpO2: 99 % O2 Device: None (Room air)  GENERAL: Chronically ill-appearing, thin, well-developed woman in no acute distress.  Fully ambulatory.  She is actually quite spry.  She is chronically pale. HEAD: Normocephalic, atraumatic. EYES: Pupils equal, round, reactive to  light.  No scleral icterus. MOUTH: Dentition intact, some teeth in poor repair.  Oral mucosa moist.  No thrush. NECK: Supple. No thyromegaly. Trachea midline. No JVD.  No adenopathy. PULMONARY: Good air entry bilaterally.  Coarse breath sounds at the bases,crackles on the right base, otherwise no adventitious sounds.   CARDIOVASCULAR: S1 and S2. Regular rate and rhythm.  No rubs, murmurs or gallops heard.   ABDOMEN: Nondistended, scaphoid.   MUSCULOSKELETAL: Mild rheumatoid arthritis changes  from hands, no active synovitis, no clubbing, no edema. NEUROLOGIC: No overt focal deficit, no gait disturbance, speech is fluent. SKIN: Intact,warm,dry.  Hematoma noted on left leg extending from mid shin to foot, healing, no overt tenderness on palpation, no crepitus. PSYCH: Mood and behavior are normal.   Assessment & Plan:     ICD-10-CM   1. ILD (interstitial lung disease) (HCC)  J84.9     2. Bronchiectasis without complication (HCC)  J47.9     3. Rheumatoid arthritis involving multiple sites with positive rheumatoid factor (HCC)  M05.79     4. Chronic cough  R05.3     5. Esophageal dysmotility  K22.4     6. Gastroesophageal reflux disease, unspecified whether esophagitis present  K21.9     7. Grade II diastolic dysfunction  I51.89     8. Shortness of breath  R06.02     9. Situational depression  F43.21      Discussion:    Pulmonary fibrosis Post-inflammatory pulmonary fibrosis with associated bronchiectasis. Reports increased dyspnea and cough, attributed to disease progression. Acknowledges no definitive treatment and has accepted lifestyle limitations. Engages in chair yoga twice a week, beneficial for physical activity and breathing. Informed that breathing exercises from yoga may improve lung function over time. - Continue chair yoga twice a week - Ask yoga instructor to demonstrate breathing exercises to improve lung function  Gastroesophageal reflux disease (GERD) Symptoms of GERD have significantly improved with Nexium. Takes Nexium an hour before breakfast and dinner for over a month, with notable symptom improvement. Anticipated that continued use will reduce cough by preventing acid reflux from affecting the lungs. - Continue Nexium as prescribed - Ensure refills for Nexium are available  Leg injury Sustained a leg injury from an incident involving a dog. Injury is healing but remains sensitive. No indication of fracture as there is no crepitus or broken skin.  Declined X-ray as pain is not severe. - Apply ice as needed for pain management      Advised if symptoms do not improve or worsen, to please contact office for sooner follow up or seek emergency care.    I spent 30 minutes of dedicated to the care of this patient on the date of this encounter to include pre-visit review of records, face-to-face time with the patient discussing conditions above, post visit ordering of testing, clinical documentation with the electronic health record, making appropriate referrals as documented, and communicating necessary findings to members of the patients care team.     C. Danice Goltz, MD [facility]  [facility]    *This note was generated using voice recognition software/Dragon and/or AI transcription program.  Despite best efforts to proofread, errors can occur which can change the meaning. Any transcriptional errors that result from this process are unintentional and may not be fully corrected at the time of dictation.

## 2023-12-16 NOTE — Patient Instructions (Signed)
 VISIT SUMMARY:  Today, we discussed your increased shortness of breath and cough, which you attribute to your pulmonary fibrosis. We also reviewed your GERD management and a recent leg injury. You are doing well with your current treatments and activities, and we have made some recommendations to help manage your symptoms and improve your overall health.  YOUR PLAN:  -PULMONARY FIBROSIS: Pulmonary fibrosis is a condition where the lung tissue becomes scarred and stiff, making it difficult to breathe. You are experiencing increased shortness of breath and cough, which is likely due to the progression of this disease. Continue with chair yoga twice a week, and ask your yoga instructor to demonstrate breathing exercises that may help improve your lung function over time.  -GASTROESOPHAGEAL REFLUX DISEASE (GERD): GERD is a condition where stomach acid frequently flows back into the tube connecting your mouth and stomach, causing irritation. Your symptoms have significantly improved with Nexium, which you take an hour before breakfast and dinner. Continue taking Nexium as prescribed, and ensure you have refills available.  -LEG INJURY: You sustained a leg injury when your daughter's dog accidentally ran into your leg. The injury is healing but remains sensitive. Since there is no severe pain, broken skin, or signs of a fracture, you can manage the pain by applying ice as needed.  INSTRUCTIONS:  Please continue with your current treatments and activities. If you experience any worsening of symptoms or new issues, schedule a follow-up appointment.

## 2023-12-20 ENCOUNTER — Ambulatory Visit (INDEPENDENT_AMBULATORY_CARE_PROVIDER_SITE_OTHER): Payer: Medicare Other | Admitting: Primary Care

## 2023-12-20 ENCOUNTER — Encounter: Payer: Self-pay | Admitting: Primary Care

## 2023-12-20 VITALS — BP 114/62 | HR 72 | Temp 97.5°F | Ht 68.0 in | Wt 106.0 lb

## 2023-12-20 DIAGNOSIS — G4700 Insomnia, unspecified: Secondary | ICD-10-CM

## 2023-12-20 DIAGNOSIS — K219 Gastro-esophageal reflux disease without esophagitis: Secondary | ICD-10-CM | POA: Diagnosis not present

## 2023-12-20 DIAGNOSIS — J849 Interstitial pulmonary disease, unspecified: Secondary | ICD-10-CM

## 2023-12-20 DIAGNOSIS — I5033 Acute on chronic diastolic (congestive) heart failure: Secondary | ICD-10-CM | POA: Diagnosis not present

## 2023-12-20 DIAGNOSIS — D649 Anemia, unspecified: Secondary | ICD-10-CM

## 2023-12-20 DIAGNOSIS — M069 Rheumatoid arthritis, unspecified: Secondary | ICD-10-CM | POA: Diagnosis not present

## 2023-12-20 DIAGNOSIS — M8000XS Age-related osteoporosis with current pathological fracture, unspecified site, sequela: Secondary | ICD-10-CM

## 2023-12-20 DIAGNOSIS — E2839 Other primary ovarian failure: Secondary | ICD-10-CM | POA: Diagnosis not present

## 2023-12-20 DIAGNOSIS — R251 Tremor, unspecified: Secondary | ICD-10-CM

## 2023-12-20 NOTE — Assessment & Plan Note (Signed)
 Following with oncology/hematology.  Office notes reviewed from October 2024.

## 2023-12-20 NOTE — Patient Instructions (Addendum)
 [facility].   Please consider updating her pneumonia vaccine as discussed.  It was a pleasure to see you today!

## 2023-12-20 NOTE — Assessment & Plan Note (Signed)
 Following with rheumatology, office notes reviewed from February 2025 through [facility].  Continue Imuran 75 mg in a.m. and 50 mg in p.m., prednisone 3 mg daily.

## 2023-12-20 NOTE — Assessment & Plan Note (Signed)
 Appears euvolemic today.  We did discuss updating her echocardiogram given her increased exertional dyspnea. She declines today.  Reviewed pulmonology notes from march 2025.

## 2023-12-20 NOTE — Assessment & Plan Note (Signed)
Controlled. ? ?Continue trazodone 50 mg at bedtime. ?

## 2023-12-20 NOTE — Assessment & Plan Note (Addendum)
 More symptomatic.   Following with pulmonology, office notes reviewed from March 2025. Continue gabapentin 200 mg at bedtime, Singulair 10 mg at bedtime, azithromycin 250 mg 3 times weekly.  Recommended to update pneumonia vaccine, she declines today.

## 2023-12-20 NOTE — Progress Notes (Signed)
 Subjective:    Patient ID: Crystal Gregory, female    DOB: 1932/04/21, 89 y.o.   MRN: [id number]  HPI  Crystal Gregory is a very pleasant 89 y.o. female with a history of idiopathic pulmonary fibrosis, interstitial lung disease, diastolic dysfunction/CHF, GERD, osteoporosis, rheumatoid arthritis, anemia, insomnia, tremors who presents today for follow-up of chronic conditions.  1) ILD/IPF: Following with pulmonology.  Currently montelukast 10 mg at bedtime, gabapentin 200 mg HS, Nexium 20 mg twice daily, azithromycin 250 mg three times weekly.  Last office visit with pulmonology was 12/16/2023 for increased dyspnea and coughing.  No changes were made to regimen.  Recommendations were to work on breathing exercises during yoga.  Last pneumonia vaccine was in 2019.  She continues to experience exertional dyspnea with cough for which she attributes to her interstitial lung disease.  Last echocardiogram was completed in April 2023 with LVEF of 60-65%, without significant valvular disease.  2) Rheumatoid Arthritis: Following with rheumatology, Dr. Allena Katz. Last office visit was in February 2025. Currently managed on Imuran 75 mg in a.m. and 50 mg in p.m and prednisone 3 mg daily.   3) Anemia/B12 Deficiency: Following with oncology/hematology, last office visit was in October 2024.  No changes were made to her regimen given stability of hemoglobin.  No iron infusion was warranted. She is due for updated labs soon.   4) Tremors: Benign, essential. Currently managed on propranolol 10 mg twice daily. Feels well managed on this regimen. Denies concerns today.   5) Osteoporosis: History of thoracic vertebral fracture. Currently managed on Prolia injections semi-annually. Her last Prolia dose was in February 2024. Her last bone density scan was in April 2023 with T score of -2.8, osteoporosis to left forearm.   6) Insomnia: Currently managed on Trazodone 50 mg HS. Feels well managed on this regimen.   Review  of Systems  Respiratory:  Positive for cough and shortness of breath.   Cardiovascular:  Negative for chest pain.  Gastrointestinal:  Negative for constipation and diarrhea.  Neurological:  Negative for dizziness, tremors and headaches.  Psychiatric/Behavioral:  The patient is not nervous/anxious.          Past Medical History:  Diagnosis Date   Allergy    Anal fissure    Aspiration into airway    CAP (community acquired pneumonia) 08/23/2017   Chest pain    a. 09/04/2018 MV: EF >65%. No ischemia/infarct.   Coronary artery calcification seen on CT scan    Diastolic dysfunction    a. 10/2016 Echo: EF 60-65%, no rwma, Gr1 DD, mild MR, nl RV fxn, mild to mod TR, PASP .   Diverticulosis    Dyspnea    Edema of right lower extremity 01/29/2022   Fever 06/15/2023   GERD (gastroesophageal reflux disease)    Hemorrhoids    Hyperplastic colon polyp    Interstitial lung disorders (HCC)    Pulmonary fibrosis (HCC)    Septicemia (HCC) 1999   spent 4 weeks in ICU, intubated -? PNA   Vertebral fracture, pathological     Social History   Socioeconomic History   Marital status: Widowed    Spouse name: Not on file   Number of children: 2   Years of education: Not on file   Highest education level: Not on file  Occupational History   Occupation: Retired   Tobacco Use   Smoking status: Former    Current packs/day: 0.00    Average packs/day: 0.3 packs/day for 20.0 years (5.0 ttl  pk-yrs)    Types: Cigarettes    Start date: 01/16/1961    Quit date: 01/16/1981    Years since quitting: 42.9   Smokeless tobacco: Never  Vaping Use   Vaping status: Never Used  Substance and Sexual Activity   Alcohol use: Yes    Alcohol/week: 7.0 standard drinks of alcohol    Types: 7 Standard drinks or equivalent per week   Drug use: No   Sexual activity: Not on file  Other Topics Concern   Not on file  Social History Narrative   Married.     2 children.  Lives with her husband in [place].    Social Drivers of Corporate investment banker Strain: Low Risk  (04/28/2023)   Overall Financial Resource Strain (CARDIA)    Difficulty of Paying Living Expenses: Not hard at all  Food Insecurity: No Food Insecurity (04/28/2023)   Hunger Vital Sign    Worried About Running Out of Food in the Last Year: Never true    Ran Out of Food in the Last Year: Never true  Transportation Needs: No Transportation Needs (04/28/2023)   PRAPARE - Administrator, Civil Service (Medical): No    Lack of Transportation (Non-Medical): No  Physical Activity: Sufficiently Active (04/28/2023)   Exercise Vital Sign    Days of Exercise per Week: 4 days    Minutes of Exercise per Session: 40 min  Stress: No Stress Concern Present (04/28/2023)   [employer] of Occupational Health - Occupational Stress Questionnaire    Feeling of Stress : Not at all  Social Connections: Moderately Isolated (04/28/2023)   Social Connection and Isolation Panel [NHANES]    Frequency of Communication with Friends and Family: More than three times a week    Frequency of Social Gatherings with Friends and Family: More than three times a week    Attends Religious Services: Never    Database administrator or Organizations: Yes    Attends Engineer, structural: More than 4 times per year    Marital Status: Widowed  Intimate Partner Violence: Not At Risk (04/28/2023)   Humiliation, Afraid, Rape, and Kick questionnaire    Fear of Current or Ex-Partner: No    Emotionally Abused: No    Physically Abused: No    Sexually Abused: No    Past Surgical History:  Procedure Laterality Date   ABDOMINAL HYSTERECTOMY     FLEXIBLE BRONCHOSCOPY N/A 07/05/2016   Procedure: FLEXIBLE BRONCHOSCOPY;  Surgeon: Stephanie Acre, MD;  Location: [facility] ORS;  Service: Cardiopulmonary;  Laterality: N/A;   IR RADIOLOGIST EVAL & MGMT  11/05/2021    Family History  Problem Relation Age of Onset   Parkinson's disease Mother    Colon polyps  Mother 31           Ulcers Father    Rheumatic fever Father    Macular degeneration Sister    Healthy Son    Healthy Daughter     No Known Allergies  Current Outpatient Medications on File Prior to Visit  Medication Sig Dispense Refill   azaTHIOprine (IMURAN) 50 MG tablet TAKE ONE AND ONE-HALF TABLETS (75 MG) EVERY MORNING AND 1 TABLET (50 MG) EVERY EVENING 225 tablet 0   denosumab (PROLIA) 60 MG/ML SOLN injection Inject 60 mg into the skin every 6 (six) months. Administer in upper arm, thigh, or abdomen     esomeprazole (NEXIUM) 20 MG capsule Take 1 capsule (20 mg total) by mouth 2 (two)  times daily before a meal. 180 capsule 3   gabapentin (NEURONTIN) 100 MG capsule Take 2 capsules (200 mg total) by mouth at bedtime. 180 capsule 1   montelukast (SINGULAIR) 10 MG tablet Take 1 tablet (10 mg total) by mouth daily. 90 tablet 4   Multiple Minerals-Vitamins (CALCIUM & VIT D3 BONE HEALTH PO) Take 1 Dose by mouth daily.     predniSONE (DELTASONE) 1 MG tablet Take 3 mg by mouth daily with breakfast.     propranolol (INDERAL) 10 MG tablet TAKE 1 TABLET TWICE A DAY FOR TREMOR 180 tablet 0   traZODone (DESYREL) 50 MG tablet Take 1 tablet (50 mg total) by mouth at bedtime as needed for sleep. For sleep. 90 tablet 3   vitamin B-12 (CYANOCOBALAMIN) 500 MCG tablet Take 500 mcg by mouth daily.     azithromycin (ZITHROMAX) 250 MG tablet Take 1 tablet 3x a week (Patient not taking: Reported on 12/20/2023) 30 each 3   cyclobenzaprine (FLEXERIL) 5 MG tablet Take 5 mg by mouth at bedtime. (Patient not taking: Reported on 12/20/2023)     No current facility-administered medications on file prior to visit.    BP 114/62   Pulse 72   Temp (!) 97.5 F (36.4 C) (Temporal)   Ht 5\' 8"  (1.727 m)   Wt 106 lb (48.1 kg)   SpO2 98%   BMI 16.12 kg/m  Objective:   Physical Exam Cardiovascular:     Rate and Rhythm: Normal rate and regular rhythm.  Pulmonary:     Effort: Pulmonary effort is normal.     Breath  sounds: Normal breath sounds.  Musculoskeletal:     Cervical back: Neck supple.  Skin:    General: Skin is warm and dry.  Neurological:     Mental Status: She is alert and oriented to person, place, and time.  Psychiatric:        Mood and Affect: Mood normal.           Assessment & Plan:  Estrogen deficiency -     DG Bone Density; Future  Diastolic dysfunction with acute on chronic heart failure [facility]) Assessment & Plan: Appears euvolemic today.  We did discuss updating her echocardiogram given her increased exertional dyspnea. She declines today.  Reviewed pulmonology notes from march 2025.   ILD (interstitial lung disease) (HCC) Assessment & Plan: More symptomatic.   Following with pulmonology, office notes reviewed from March 2025. Continue gabapentin 200 mg at bedtime, Singulair 10 mg at bedtime, azithromycin 250 mg 3 times weekly.  Recommended to update pneumonia vaccine, she declines today.   Gastroesophageal reflux disease, unspecified whether esophagitis present Assessment & Plan: Improved and controlled.  Continue Nexium 20 mg twice daily.   Age-related osteoporosis with current pathological fracture, sequela Assessment & Plan: No Prolia injection in greater than 1 year. Will renew [employer].  Bone density scan due in May 2025, orders placed.   Rheumatoid arthritis, involving unspecified site, unspecified whether rheumatoid factor present [facility]) Assessment & Plan: Following with rheumatology, office notes reviewed from February 2025 through [facility].  Continue Imuran 75 mg in a.m. and 50 mg in p.m., prednisone 3 mg daily.   Anemia, unspecified type Assessment & Plan: Following with oncology/hematology.  Office notes reviewed from October 2024.   Insomnia, unspecified type Assessment & Plan: Controlled. Continue trazodone 50 mg at bedtime.   Tremor of both hands Assessment & Plan: Controlled. Continue propranolol 10 mg  twice daily.  Doreene Nest, NP

## 2023-12-20 NOTE — Assessment & Plan Note (Signed)
 Controlled. Continue propranolol 10 mg twice daily.

## 2023-12-20 NOTE — Assessment & Plan Note (Signed)
 No Prolia injection in greater than 1 year. Will renew [employer].  Bone density scan due in May 2025, orders placed.

## 2023-12-20 NOTE — Assessment & Plan Note (Signed)
 Improved and controlled.  Continue Nexium 20 mg twice daily.

## 2023-12-26 ENCOUNTER — Other Ambulatory Visit: Payer: Self-pay

## 2023-12-26 DIAGNOSIS — M8000XS Age-related osteoporosis with current pathological fracture, unspecified site, sequela: Secondary | ICD-10-CM

## 2023-12-26 MED ORDER — DENOSUMAB 60 MG/ML ~~LOC~~ SOSY: 60.0000 mg | PREFILLED_SYRINGE | Freq: Once | SUBCUTANEOUS | Status: DC

## 2023-12-31 DIAGNOSIS — G4700 Insomnia, unspecified: Secondary | ICD-10-CM

## 2023-12-31 MED ORDER — TRAZODONE HCL 50 MG PO TABS: 50.0000 mg | ORAL_TABLET | Freq: Every evening | ORAL | 3 refills | Status: DC | PRN

## 2024-01-06 ENCOUNTER — Inpatient Hospital Stay (HOSPITAL_BASED_OUTPATIENT_CLINIC_OR_DEPARTMENT_OTHER): Payer: Medicare Other | Admitting: Oncology

## 2024-01-06 ENCOUNTER — Inpatient Hospital Stay: Payer: Medicare Other | Attending: Oncology

## 2024-01-06 ENCOUNTER — Encounter: Payer: Self-pay | Admitting: Oncology

## 2024-01-06 VITALS — BP 120/63 | HR 83 | Temp 95.7°F | Resp 19 | Ht 68.0 in | Wt 107.1 lb

## 2024-01-06 DIAGNOSIS — Z87891 Personal history of nicotine dependence: Secondary | ICD-10-CM | POA: Diagnosis not present

## 2024-01-06 DIAGNOSIS — Z8379 Family history of other diseases of the digestive system: Secondary | ICD-10-CM | POA: Insufficient documentation

## 2024-01-06 DIAGNOSIS — Z8719 Personal history of other diseases of the digestive system: Secondary | ICD-10-CM | POA: Insufficient documentation

## 2024-01-06 DIAGNOSIS — Z90710 Acquired absence of both cervix and uterus: Secondary | ICD-10-CM | POA: Diagnosis not present

## 2024-01-06 DIAGNOSIS — D539 Nutritional anemia, unspecified: Secondary | ICD-10-CM | POA: Diagnosis not present

## 2024-01-06 DIAGNOSIS — R0602 Shortness of breath: Secondary | ICD-10-CM | POA: Insufficient documentation

## 2024-01-06 DIAGNOSIS — Z79624 Long term (current) use of inhibitors of nucleotide synthesis: Secondary | ICD-10-CM | POA: Insufficient documentation

## 2024-01-06 DIAGNOSIS — Z602 Problems related to living alone: Secondary | ICD-10-CM | POA: Diagnosis not present

## 2024-01-06 DIAGNOSIS — Z83518 Family history of other specified eye disorder: Secondary | ICD-10-CM | POA: Diagnosis not present

## 2024-01-06 DIAGNOSIS — Z79899 Other long term (current) drug therapy: Secondary | ICD-10-CM | POA: Insufficient documentation

## 2024-01-06 DIAGNOSIS — Z83719 Family history of colon polyps, unspecified: Secondary | ICD-10-CM | POA: Insufficient documentation

## 2024-01-06 DIAGNOSIS — Z818 Family history of other mental and behavioral disorders: Secondary | ICD-10-CM | POA: Diagnosis not present

## 2024-01-06 DIAGNOSIS — I2510 Atherosclerotic heart disease of native coronary artery without angina pectoris: Secondary | ICD-10-CM | POA: Insufficient documentation

## 2024-01-06 DIAGNOSIS — M069 Rheumatoid arthritis, unspecified: Secondary | ICD-10-CM | POA: Diagnosis not present

## 2024-01-06 DIAGNOSIS — D638 Anemia in other chronic diseases classified elsewhere: Secondary | ICD-10-CM | POA: Diagnosis not present

## 2024-01-06 DIAGNOSIS — Z860102 Personal history of hyperplastic colon polyps: Secondary | ICD-10-CM | POA: Diagnosis not present

## 2024-01-06 DIAGNOSIS — Z7289 Other problems related to lifestyle: Secondary | ICD-10-CM | POA: Diagnosis not present

## 2024-01-06 DIAGNOSIS — E538 Deficiency of other specified B group vitamins: Secondary | ICD-10-CM | POA: Insufficient documentation

## 2024-01-06 LAB — CBC
HCT: 33.3 % — ABNORMAL LOW (ref 36.0–46.0)
Hemoglobin: 10.9 g/dL — ABNORMAL LOW (ref 12.0–15.0)
MCH: 35.9 pg — ABNORMAL HIGH (ref 26.0–34.0)
MCHC: 32.7 g/dL (ref 30.0–36.0)
MCV: 109.5 fL — ABNORMAL HIGH (ref 80.0–100.0)
Platelets: 319 10*3/uL (ref 150–400)
RBC: 3.04 MIL/uL — ABNORMAL LOW (ref 3.87–5.11)
RDW: 14.5 % (ref 11.5–15.5)
WBC: 6.9 10*3/uL (ref 4.0–10.5)
nRBC: 0 % (ref 0.0–0.2)

## 2024-01-06 LAB — IRON AND TIBC
Iron: 75 ug/dL (ref 28–170)
Saturation Ratios: 34 % — ABNORMAL HIGH (ref 10.4–31.8)
TIBC: 218 ug/dL — ABNORMAL LOW (ref 250–450)
UIBC: 143 ug/dL

## 2024-01-06 LAB — FERRITIN: Ferritin: 325 ng/mL — ABNORMAL HIGH (ref 11–307)

## 2024-01-06 NOTE — Progress Notes (Signed)
 Hematology/Oncology Consult note [facility]  [phone] [phone]  Patient Care Team: Doreene Nest, NP as PCP - General (Internal Medicine) Antonieta Iba, MD as PCP - Cardiology (Cardiology) Antonieta Iba, MD as Consulting Physician (Cardiology) Pollyann Savoy, MD as Consulting Physician (Rheumatology) Salena Saner, MD as Consulting Physician (Pulmonary Disease)   Name of the patient: Crystal Gregory  [id number]  [birth year]   Date of visit: 01/06/24  Diagnosis- macrocytic anemia likely secondary to chronic disease   Chief complaint/ Reason for visit-routine follow-up of anemia of chronic disease  Heme/Onc history: Patient is a 89 year old female referred for macrocytic anemia.  She has a prior history of rheumatoid arthritis as well as interstitial lung disease from rheumatoid arthritis as well.  She is not on home oxygen.  She has been referred for anemia.  Dating back at her CBC is her hemoglobin was always between 10-11 even back in 2018 and 2019.  Her most recent CBC from 08/06/2020 showed white count of 4.7, H&H of 9.5/28.8 with an MCV of 114 and platelet count of 276.  She has developed progressive macrocytosis since 2019.  B12 levels were mildly low at 277.  Patient reports chronic fatigue and exertional shortness of breath.   She is on Imuran for rheumatoid arthritis   Results of blood work from 08/11/2020 were as follows: CBC showed white count of 4.8, H&H of 9.9/29.6 with an MCV of 114 and a platelet count of 300.  B12 level was low at 277.  Folate and TSH was normal.  EPO level was elevated at 100.  Haptoglobin was normal.  Myeloma panel showed polyclonal increase but no monoclonal protein.  Reticulocyte count was inappropriately low for the degree of anemia at 2.2%.  CMP showed normal kidney and liver functions.    Interval history-she is doing well for her age.  She has baseline fatigue and exertional  shortness of breath.  She lives alone and is independent of her ADLs.  No recent hospitalizations.  ECOG PS- 2 Pain scale- 0   Review of systems- Review of Systems  Constitutional:  Negative for chills, fever, malaise/fatigue and weight loss.  HENT:  Negative for congestion, ear discharge and nosebleeds.   Eyes:  Negative for blurred vision.  Respiratory:  Negative for cough, hemoptysis, sputum production, shortness of breath and wheezing.   Cardiovascular:  Negative for chest pain, palpitations, orthopnea and claudication.  Gastrointestinal:  Negative for abdominal pain, blood in stool, constipation, diarrhea, heartburn, melena, nausea and vomiting.  Genitourinary:  Negative for dysuria, flank pain, frequency, hematuria and urgency.  Musculoskeletal:  Negative for back pain, joint pain and myalgias.  Skin:  Negative for rash.  Neurological:  Negative for dizziness, tingling, focal weakness, seizures, weakness and headaches.  Endo/Heme/Allergies:  Does not bruise/bleed easily.  Psychiatric/Behavioral:  Negative for depression and suicidal ideas. The patient does not have insomnia.       No Known Allergies   Past Medical History:  Diagnosis Date   Allergy    Anal fissure    Aspiration into airway    CAP (community acquired pneumonia) 08/23/2017   Chest pain    a. 09/04/2018 MV: EF >65%. No ischemia/infarct.   Coronary artery calcification seen on CT scan    Diastolic dysfunction    a. 10/2016 Echo: EF 60-65%, no rwma, Gr1 DD, mild MR, nl RV fxn, mild to mod TR, PASP .   Diverticulosis    Dyspnea  Edema of right lower extremity 01/29/2022   Fever 06/15/2023   GERD (gastroesophageal reflux disease)    Hemorrhoids    Hyperplastic colon polyp    Interstitial lung disorders (HCC)    Pulmonary fibrosis (HCC)    Septicemia (HCC) 1999   spent 4 weeks in ICU, intubated -? PNA   Vertebral fracture, pathological      Past Surgical History:  Procedure Laterality Date    ABDOMINAL HYSTERECTOMY     FLEXIBLE BRONCHOSCOPY N/A 07/05/2016   Procedure: FLEXIBLE BRONCHOSCOPY;  Surgeon: Stephanie Acre, MD;  Location: [facility] ORS;  Service: Cardiopulmonary;  Laterality: N/A;   IR RADIOLOGIST EVAL & MGMT  11/05/2021    Social History   Socioeconomic History   Marital status: Widowed    Spouse name: Not on file   Number of children: 2   Years of education: Not on file   Highest education level: Not on file  Occupational History   Occupation: Retired   Tobacco Use   Smoking status: Former    Current packs/day: 0.00    Average packs/day: 0.3 packs/day for 20.0 years (5.0 ttl pk-yrs)    Types: Cigarettes    Start date: 01/16/1961    Quit date: 01/16/1981    Years since quitting: 43.0   Smokeless tobacco: Never  Vaping Use   Vaping status: Never Used  Substance and Sexual Activity   Alcohol use: Yes    Alcohol/week: 7.0 standard drinks of alcohol    Types: 7 Standard drinks or equivalent per week   Drug use: No   Sexual activity: Not on file  Other Topics Concern   Not on file  Social History Narrative   Married.     2 children.  Lives with her husband in [place].   Social Drivers of Corporate investment banker Strain: Low Risk  (04/28/2023)   Overall Financial Resource Strain (CARDIA)    Difficulty of Paying Living Expenses: Not hard at all  Food Insecurity: No Food Insecurity (04/28/2023)   Hunger Vital Sign    Worried About Running Out of Food in the Last Year: Never true    Ran Out of Food in the Last Year: Never true  Transportation Needs: No Transportation Needs (04/28/2023)   PRAPARE - Administrator, Civil Service (Medical): No    Lack of Transportation (Non-Medical): No  Physical Activity: Sufficiently Active (04/28/2023)   Exercise Vital Sign    Days of Exercise per Week: 4 days    Minutes of Exercise per Session: 40 min  Stress: No Stress Concern Present (04/28/2023)   [employer] of Occupational Health - Occupational Stress  Questionnaire    Feeling of Stress : Not at all  Social Connections: Moderately Isolated (04/28/2023)   Social Connection and Isolation Panel [NHANES]    Frequency of Communication with Friends and Family: More than three times a week    Frequency of Social Gatherings with Friends and Family: More than three times a week    Attends Religious Services: Never    Database administrator or Organizations: Yes    Attends Engineer, structural: More than 4 times per year    Marital Status: Widowed  Intimate Partner Violence: Not At Risk (04/28/2023)   Humiliation, Afraid, Rape, and Kick questionnaire    Fear of Current or Ex-Partner: No    Emotionally Abused: No    Physically Abused: No    Sexually Abused: No    Family History  Problem Relation  Age of Onset   Parkinson's disease Mother    Colon polyps Mother 71           Ulcers Father    Rheumatic fever Father    Macular degeneration Sister    Healthy Son    Healthy Daughter      Current Outpatient Medications:    azaTHIOprine (IMURAN) 50 MG tablet, TAKE ONE AND ONE-HALF TABLETS (75 MG) EVERY MORNING AND 1 TABLET (50 MG) EVERY EVENING, Disp: 225 tablet, Rfl: 0   azithromycin (ZITHROMAX) 250 MG tablet, Take 1 tablet 3x a week (Patient not taking: Reported on 12/20/2023), Disp: 30 each, Rfl: 3   cyclobenzaprine (FLEXERIL) 5 MG tablet, Take 5 mg by mouth at bedtime. (Patient not taking: Reported on 12/20/2023), Disp: , Rfl:    denosumab (PROLIA) 60 MG/ML SOLN injection, Inject 60 mg into the skin every 6 (six) months. Administer in upper arm, thigh, or abdomen, Disp: , Rfl:    esomeprazole (NEXIUM) 20 MG capsule, Take 1 capsule (20 mg total) by mouth 2 (two) times daily before a meal., Disp: 180 capsule, Rfl: 3   gabapentin (NEURONTIN) 100 MG capsule, Take 2 capsules (200 mg total) by mouth at bedtime., Disp: 180 capsule, Rfl: 1   montelukast (SINGULAIR) 10 MG tablet, Take 1 tablet (10 mg total) by mouth daily., Disp: 90 tablet, Rfl:  4   Multiple Minerals-Vitamins (CALCIUM & VIT D3 BONE HEALTH PO), Take 1 Dose by mouth daily., Disp: , Rfl:    predniSONE (DELTASONE) 1 MG tablet, Take 3 mg by mouth daily with breakfast., Disp: , Rfl:    propranolol (INDERAL) 10 MG tablet, TAKE 1 TABLET TWICE A DAY FOR TREMOR, Disp: 180 tablet, Rfl: 0   traZODone (DESYREL) 50 MG tablet, Take 1 tablet (50 mg total) by mouth at bedtime as needed for sleep. For sleep., Disp: 90 tablet, Rfl: 3   vitamin B-12 (CYANOCOBALAMIN) 500 MCG tablet, Take 500 mcg by mouth daily., Disp: , Rfl:   Current Facility-Administered Medications:    [START ON 01/09/2024] denosumab (PROLIA) injection 60 mg, 60 mg, Subcutaneous, Once, Doreene Nest, NP  Physical exam:  Vitals:   01/06/24 1005  BP: 120/63  Pulse: 83  Resp: 19  Temp: (!) 95.7 F (35.4 C)  TempSrc: Tympanic  SpO2: 100%  Weight: 107 lb 1.6 oz (48.6 kg)  Height: 5\' 8"  (1.727 m)   Physical Exam Cardiovascular:     Rate and Rhythm: Normal rate and regular rhythm.     Heart sounds: Normal heart sounds.  Pulmonary:     Effort: Pulmonary effort is normal.     Breath sounds: Normal breath sounds.  Skin:    General: Skin is warm and dry.  Neurological:     Mental Status: She is alert and oriented to person, place, and time.      I have personally reviewed labs listed below:    Latest Ref Rng & Units 06/15/2023   11:34 AM  CMP  Glucose 70 - 99 mg/dL 88   BUN 6 - 23 mg/dL 18   Creatinine 5.78 - 1.20 mg/dL 4.69   Sodium 629 - 528 mEq/L 136   Potassium 3.5 - 5.1 mEq/L 4.4   Chloride 96 - 112 mEq/L 99   CO2 19 - 32 mEq/L 28   Calcium 8.4 - 10.5 mg/dL 9.2       Latest Ref Rng & Units 01/06/2024    9:52 AM  CBC  WBC 4.0 - 10.5 K/uL 6.9  Hemoglobin 12.0 - 15.0 g/dL 16.1   Hematocrit 09.6 - 46.0 % 33.3   Platelets 150 - 400 K/uL 319      Assessment and plan- Patient is a 89 y.o. female here for routine follow-up of anemia of chronic disease  Patient's hemoglobin today is 10.9  which is stable over the last 1 year.Iron studies are within normal limits.  Given the stability of her hemoglobin I will monitor her levels every 6 months and see her back in 1 year   Visit Diagnosis 1. Anemia of chronic disease   2. B12 deficiency   3. Macrocytic anemia      Dr. Owens Shark, MD, MPH [facility] [phone] 01/06/2024 12:25 PM

## 2024-01-09 ENCOUNTER — Encounter: Payer: Self-pay | Admitting: Pulmonary Disease

## 2024-01-09 MED ORDER — GABAPENTIN 100 MG PO CAPS: 200.0000 mg | ORAL_CAPSULE | Freq: Every day | ORAL | 3 refills | Status: DC

## 2024-01-09 NOTE — Telephone Encounter (Signed)
 Sent!

## 2024-01-13 DIAGNOSIS — Z23 Encounter for immunization: Secondary | ICD-10-CM | POA: Diagnosis not present

## 2024-01-20 ENCOUNTER — Telehealth: Payer: Self-pay

## 2024-01-20 NOTE — Telephone Encounter (Signed)
 Prolia VOB initiated via [email or web address]  Next Prolia inj DUE: NOW

## 2024-01-20 NOTE — Telephone Encounter (Signed)
 Pt ready for scheduling for PROLIA  on or after : 01/20/24  Option# 1: Buy/Bill (Office supplied medication)  Out-of-pocket cost due at time of clinic visit: $0  Number of injection/visits approved: ---  Primary: MEDICARE Prolia  co-insurance: 0% Admin fee co-insurance: 0%  Secondary: TRICARE FOR LIFE-MEDSUP Prolia  co-insurance:  Admin fee co-insurance:   Medical Benefit Details: Date Benefits were checked: 01/20/24 Deductible: $257 Met of $257 Required/ Coinsurance: 0%/ Admin Fee: 0%  Prior Auth: N/A PA# Expiration Date:   # of doses approved: ----------------------------------------------------------------------- Option# 2- Med Obtained from pharmacy:  Pharmacy benefit: Copay $--- (Paid to pharmacy) Admin Fee: --- (Pay at clinic)  Prior Auth: N/A PA# Expiration Date:   # of doses approved:   If patient wants fill through the pharmacy benefit please send prescription to:  --- , and include estimated need by date in rx notes. Pharmacy will ship medication directly to the office.  Patient NOT eligible for Prolia  Copay Card. Copay Card can make patient's cost as little as $25. Link to apply: [email or web address]://www.amgensupportplus.com/copay  ** This summary of benefits is an estimation of the patient's out-of-pocket cost. Exact cost may very based on individual plan coverage.

## 2024-01-20 NOTE — Telephone Encounter (Signed)
 Crystal Gregory

## 2024-01-23 ENCOUNTER — Ambulatory Visit: Payer: Self-pay | Admitting: Primary Care

## 2024-01-23 NOTE — Telephone Encounter (Signed)
 Noted.

## 2024-01-23 NOTE — Telephone Encounter (Signed)
 Can we change her appointment to 04/23 at 3:40 pm?

## 2024-01-23 NOTE — Telephone Encounter (Signed)
 Chief Complaint: Vertigo chronic issue   Symptoms: Nausea, lightheaded  Disposition: / [x] Appointment(In office)  Additional Notes: Pt states she saw Tretha Fu, NP, for this several months ago. Pt states she was prescribed a medication and PT for this. Pt states the medication did not work but the PT did work. Pt states the vertigo came back about 10 days ago. Pt denies seeing a different provider sooner as she states Tretha Fu, NP, is most familiar with her history. This RN scheduled pt for first available appointment with Tretha Fu, NP. This RN educated pt on new-worsening symptoms and when to call back/seek emergent care. Pt verbalized understanding and agrees to plan.    Copied from CRM [id number]. Topic: Clinical - Red Word Triage >> Jan 23, 2024 12:46 PM Howard Macho wrote: Reason for CRM; patient is having vertigo symptoms Reason for Disposition  [1] MILD dizziness (e.g., vertigo; walking normally) AND [2] has been evaluated by doctor (or NP/PA) for this  Answer Assessment - Initial Assessment Questions Vertigo ongoing issue, came back 10 days ago Feels nauseous and lightheaded  Protocols used: Dizziness - Vertigo-A-AH

## 2024-01-24 NOTE — Telephone Encounter (Signed)
 Called and spoke with patient, moved appt to 4/23 @ 3:40

## 2024-01-25 ENCOUNTER — Ambulatory Visit (INDEPENDENT_AMBULATORY_CARE_PROVIDER_SITE_OTHER): Admitting: Primary Care

## 2024-01-25 ENCOUNTER — Encounter: Payer: Self-pay | Admitting: Primary Care

## 2024-01-25 VITALS — BP 98/62 | HR 116 | Temp 97.2°F | Ht 68.0 in | Wt 105.0 lb

## 2024-01-25 DIAGNOSIS — R42 Dizziness and giddiness: Secondary | ICD-10-CM | POA: Insufficient documentation

## 2024-01-25 MED ORDER — MECLIZINE HCL 25 MG PO TABS: 25.0000 mg | ORAL_TABLET | Freq: Three times a day (TID) | ORAL | 0 refills | Status: DC | PRN

## 2024-01-25 NOTE — Patient Instructions (Signed)
 You will either be contacted via phone regarding your referral to hematology, or you may receive a letter on your MyChart portal from our referral team with instructions for scheduling an appointment. Please let us  know if you have not been contacted by anyone within two weeks.  You may take meclizine  25 mg tablets up to 3 times daily as needed for dizziness.  Please consider the CT scan of your head as discussed.  It was a pleasure to see you today!

## 2024-01-25 NOTE — Assessment & Plan Note (Addendum)
 Symptoms are representative.  Exam today not suggestive of a stroke.  ECG today with NSR, rate of 82, PVCs apparent. ECG has changed compared to ECG from 2019 and 2018. Will consult with cardiology.    Reviewed labs from April 2025 from hematology.  Recommended CT head for which she kindly declines. Referral placed for physical therapy. Start Meclizine  25 mg TID PRN.  Drowsiness precautions provided.  Strict ED precautions provided, discussed symptoms of stroke.

## 2024-01-25 NOTE — Progress Notes (Signed)
 Subjective:    Patient ID: Crystal Gregory, female    DOB: August 22, 1932, 89 y.o.   MRN: [id number]  Dizziness Pertinent negatives include no chest pain, numbness or weakness.    Crystal Gregory is a very pleasant 89 y.o. female with a history of idiopathic pulmonary fibrosis, interstitial lung disease, rheumatoid arthritis, anemia, insomnia, vitamin B12 deficiency tremors, vertigo who presents today to discuss dizziness.   She has a history of vertigo, was evaluated and treated for vertigo in September 2024. She underwent physical therapy for her vertigo in September 2024 which helped. Her recent symptoms began 6 weeks ago with dizziness, feels like her prior vertigo. She describes her dizziness as a room spinning feeling and that she is spinning. Initially, her dizziness was intermittent, but over the last few weeks her dizziness has progressed and is more constant.  Her dizziness is constant, worse with movement, improved with rest. She does not experience her dizziness when driving. She has not taken anything for her symptoms. She denies falls, blurred vision, headaches, one sided weakness.   She recently underwent blood work per her hematologist which showed stable anemia.   Review of Systems  Eyes:  Negative for visual disturbance.  Respiratory:  Negative for shortness of breath.   Cardiovascular:  Negative for chest pain.  Neurological:  Positive for dizziness. Negative for speech difficulty, weakness and numbness.         Past Medical History:  Diagnosis Date   Allergy    Anal fissure    Aspiration into airway    CAP (community acquired pneumonia) 08/23/2017   Chest pain    a. 09/04/2018 MV: EF >65%. No ischemia/infarct.   Coronary artery calcification seen on CT scan    Diastolic dysfunction    a. 10/2016 Echo: EF 60-65%, no rwma, Gr1 DD, mild MR, nl RV fxn, mild to mod TR, PASP .   Diverticulosis    Dyspnea    Edema of right lower extremity 01/29/2022   Fever  06/15/2023   GERD (gastroesophageal reflux disease)    Hemorrhoids    Hyperplastic colon polyp    Interstitial lung disorders (HCC)    Pulmonary fibrosis (HCC)    Septicemia (HCC) 1999   spent 4 weeks in ICU, intubated -? PNA   Vertebral fracture, pathological     Social History   Socioeconomic History   Marital status: Widowed    Spouse name: Not on file   Number of children: 2   Years of education: Not on file   Highest education level: Not on file  Occupational History   Occupation: Retired   Tobacco Use   Smoking status: Former    Current packs/day: 0.00    Average packs/day: 0.3 packs/day for 20.0 years (5.0 ttl pk-yrs)    Types: Cigarettes    Start date: 01/16/1961    Quit date: 01/16/1981    Years since quitting: 43.0   Smokeless tobacco: Never  Vaping Use   Vaping status: Never Used  Substance and Sexual Activity   Alcohol use: Yes    Alcohol/week: 7.0 standard drinks of alcohol    Types: 7 Standard drinks or equivalent per week   Drug use: No   Sexual activity: Not on file  Other Topics Concern   Not on file  Social History Narrative   Married.     2 children.  Lives with her husband in [place].   Social Drivers of Health   Financial Resource Strain: Low Risk  (04/28/2023)  Overall Financial Resource Strain (CARDIA)    Difficulty of Paying Living Expenses: Not hard at all  Food Insecurity: No Food Insecurity (04/28/2023)   Hunger Vital Sign    Worried About Running Out of Food in the Last Year: Never true    Ran Out of Food in the Last Year: Never true  Transportation Needs: No Transportation Needs (04/28/2023)   PRAPARE - Administrator, Civil Service (Medical): No    Lack of Transportation (Non-Medical): No  Physical Activity: Sufficiently Active (04/28/2023)   Exercise Vital Sign    Days of Exercise per Week: 4 days    Minutes of Exercise per Session: 40 min  Stress: No Stress Concern Present (04/28/2023)   [employer] of  Occupational Health - Occupational Stress Questionnaire    Feeling of Stress : Not at all  Social Connections: Moderately Isolated (04/28/2023)   Social Connection and Isolation Panel [NHANES]    Frequency of Communication with Friends and Family: More than three times a week    Frequency of Social Gatherings with Friends and Family: More than three times a week    Attends Religious Services: Never    Database administrator or Organizations: Yes    Attends Engineer, structural: More than 4 times per year    Marital Status: Widowed  Intimate Partner Violence: Not At Risk (04/28/2023)   Humiliation, Afraid, Rape, and Kick questionnaire    Fear of Current or Ex-Partner: No    Emotionally Abused: No    Physically Abused: No    Sexually Abused: No    Past Surgical History:  Procedure Laterality Date   ABDOMINAL HYSTERECTOMY     FLEXIBLE BRONCHOSCOPY N/A 07/05/2016   Procedure: FLEXIBLE BRONCHOSCOPY;  Surgeon: Laine Piggs, MD;  Location: [facility] ORS;  Service: Cardiopulmonary;  Laterality: N/A;   IR RADIOLOGIST EVAL & MGMT  11/05/2021    Family History  Problem Relation Age of Onset   Parkinson's disease Mother    Colon polyps Mother 68           Ulcers Father    Rheumatic fever Father    Macular degeneration Sister    Healthy Son    Healthy Daughter     No Known Allergies  Current Outpatient Medications on File Prior to Visit  Medication Sig Dispense Refill   azaTHIOprine  (IMURAN ) 50 MG tablet TAKE ONE AND ONE-HALF TABLETS (75 MG) EVERY MORNING AND 1 TABLET (50 MG) EVERY EVENING 225 tablet 0   azithromycin  (ZITHROMAX ) 250 MG tablet Take 1 tablet 3x a week 30 each 3   denosumab  (PROLIA ) 60 MG/ML SOLN injection Inject 60 mg into the skin every 6 (six) months. Administer in upper arm, thigh, or abdomen     esomeprazole  (NEXIUM ) 20 MG capsule Take 1 capsule (20 mg total) by mouth 2 (two) times daily before a meal. 180 capsule 3   gabapentin  (NEURONTIN ) 100 MG capsule Take 2  capsules (200 mg total) by mouth at bedtime. 180 capsule 3   montelukast  (SINGULAIR ) 10 MG tablet Take 1 tablet (10 mg total) by mouth daily. 90 tablet 4   Multiple Minerals-Vitamins (CALCIUM & VIT D3 BONE HEALTH PO) Take 1 Dose by mouth daily.     predniSONE  (DELTASONE ) 1 MG tablet Take 3 mg by mouth daily with breakfast.     propranolol  (INDERAL ) 10 MG tablet TAKE 1 TABLET TWICE A DAY FOR TREMOR 180 tablet 0   traZODone  (DESYREL ) 50 MG tablet Take 1 tablet (50  mg total) by mouth at bedtime as needed for sleep. For sleep. 90 tablet 3   vitamin B-12 (CYANOCOBALAMIN ) 500 MCG tablet Take 500 mcg by mouth daily.     Current Facility-Administered Medications on File Prior to Visit  Medication Dose Route Frequency Provider Last Rate Last Admin   denosumab  (PROLIA ) injection 60 mg  60 mg Subcutaneous Once Gabriel John, NP        BP 98/62   Pulse (!) 116   Temp (!) 97.2 F (36.2 C) (Temporal)   Ht 5\' 8"  (1.727 m)   Wt 105 lb (47.6 kg)   SpO2 100%   BMI 15.97 kg/m  Objective:   Physical Exam Eyes:     Extraocular Movements: Extraocular movements intact.  Cardiovascular:     Rate and Rhythm: Normal rate and regular rhythm.  Pulmonary:     Effort: Pulmonary effort is normal.     Breath sounds: Normal breath sounds.  Musculoskeletal:     Cervical back: Neck supple.  Skin:    General: Skin is warm and dry.  Neurological:     Mental Status: She is alert and oriented to person, place, and time.     Cranial Nerves: No cranial nerve deficit.     Coordination: Coordination normal.  Psychiatric:        Mood and Affect: Mood normal.           Assessment & Plan:  Vertigo Assessment & Plan: Symptoms are representative.  Exam today not suggestive of a stroke.  ECG today with NSR, rate of 82, PVCs apparent. ECG has changed compared to ECG from 2019 and 2018. Will consult with cardiology.    Reviewed labs from April 2025 from hematology.  Recommended CT head for which she  kindly declines. Referral placed for physical therapy. Start Meclizine  25 mg TID PRN.  Drowsiness precautions provided.  Strict ED precautions provided, discussed symptoms of stroke.  Orders: -     Ambulatory referral to Physical Therapy -     Meclizine  HCl; Take 1 tablet (25 mg total) by mouth 3 (three) times daily as needed for dizziness.  Dispense: 30 tablet; Refill: 0 -     EKG 12-Lead        Gabriel John, NP

## 2024-01-26 ENCOUNTER — Other Ambulatory Visit: Payer: Self-pay | Admitting: *Deleted

## 2024-01-26 ENCOUNTER — Ambulatory Visit
Admission: RE | Admit: 2024-01-26 | Discharge: 2024-01-26 | Disposition: A | Discharge status: Home / Self Care | Source: Ambulatory Visit | Attending: Primary Care | Admitting: Primary Care

## 2024-01-26 DIAGNOSIS — R42 Dizziness and giddiness: Secondary | ICD-10-CM

## 2024-01-26 DIAGNOSIS — M8000XS Age-related osteoporosis with current pathological fracture, unspecified site, sequela: Secondary | ICD-10-CM

## 2024-01-27 ENCOUNTER — Other Ambulatory Visit (INDEPENDENT_AMBULATORY_CARE_PROVIDER_SITE_OTHER)

## 2024-01-27 DIAGNOSIS — M8000XS Age-related osteoporosis with current pathological fracture, unspecified site, sequela: Secondary | ICD-10-CM | POA: Diagnosis not present

## 2024-01-27 DIAGNOSIS — J0100 Acute maxillary sinusitis, unspecified: Secondary | ICD-10-CM

## 2024-01-27 LAB — BASIC METABOLIC PANEL WITH GFR
BUN: 16 mg/dL (ref 6–23)
CO2: 30 meq/L (ref 19–32)
Calcium: 8.6 mg/dL (ref 8.4–10.5)
Chloride: 98 meq/L (ref 96–112)
Creatinine, Ser: 0.54 mg/dL (ref 0.40–1.20)
GFR: 81.24 mL/min (ref >60.00)
Glucose, Bld: 90 mg/dL (ref 70–99)
Potassium: 4.4 meq/L (ref 3.5–5.1)
Sodium: 135 meq/L (ref 135–145)

## 2024-01-27 MED ORDER — AMOXICILLIN-POT CLAVULANATE 875-125 MG PO TABS: 1.0000 | ORAL_TABLET | Freq: Two times a day (BID) | ORAL | 0 refills | Status: DC

## 2024-01-30 DIAGNOSIS — R42 Dizziness and giddiness: Secondary | ICD-10-CM | POA: Diagnosis not present

## 2024-01-31 ENCOUNTER — Telehealth: Payer: Self-pay | Admitting: Primary Care

## 2024-01-31 NOTE — Telephone Encounter (Signed)
 Confirmed creatinine clearance which is 52.06. Per current guidelines, okay to proceed with Prolia  for creatinine clearance >30. Thanks!

## 2024-01-31 NOTE — Telephone Encounter (Signed)
 Pt is scheduled for prolia  on 02/02/24. Creatinine clearance is 52.06 per the BMET from 01/27/24. Is pt okay to get prolia ?

## 2024-01-31 NOTE — Telephone Encounter (Signed)
Noted on appt notes.

## 2024-02-01 DIAGNOSIS — R42 Dizziness and giddiness: Secondary | ICD-10-CM | POA: Diagnosis not present

## 2024-02-02 ENCOUNTER — Ambulatory Visit

## 2024-02-02 ENCOUNTER — Ambulatory Visit: Admitting: Primary Care

## 2024-02-02 DIAGNOSIS — M8000XS Age-related osteoporosis with current pathological fracture, unspecified site, sequela: Secondary | ICD-10-CM | POA: Diagnosis not present

## 2024-02-02 MED ORDER — DENOSUMAB 60 MG/ML ~~LOC~~ SOSY
60.0000 mg | PREFILLED_SYRINGE | Freq: Once | SUBCUTANEOUS | Status: AC
  Administered 2024-02-02: 60 mg via SUBCUTANEOUS

## 2024-02-02 MED ORDER — DENOSUMAB 60 MG/ML ~~LOC~~ SOSY: 60.0000 mg | PREFILLED_SYRINGE | Freq: Once | SUBCUTANEOUS | Status: DC

## 2024-02-02 NOTE — Progress Notes (Signed)
 Per orders of Aneta Bar, DPN AGNP-C, injection of prolia  60 mg  given by Claretha Crocker in left arm. Patient tolerated injection well. Patient will make appointment for 6 month.

## 2024-02-09 DIAGNOSIS — R42 Dizziness and giddiness: Secondary | ICD-10-CM | POA: Diagnosis not present

## 2024-02-13 ENCOUNTER — Ambulatory Visit
Admission: RE | Admit: 2024-02-13 | Discharge: 2024-02-13 | Disposition: A | Discharge status: Home / Self Care | Source: Ambulatory Visit | Attending: Primary Care | Admitting: Primary Care

## 2024-02-13 DIAGNOSIS — M810 Age-related osteoporosis without current pathological fracture: Secondary | ICD-10-CM | POA: Diagnosis not present

## 2024-02-13 DIAGNOSIS — E2839 Other primary ovarian failure: Secondary | ICD-10-CM | POA: Insufficient documentation

## 2024-02-13 DIAGNOSIS — Z780 Asymptomatic menopausal state: Secondary | ICD-10-CM | POA: Diagnosis not present

## 2024-02-13 DIAGNOSIS — R42 Dizziness and giddiness: Secondary | ICD-10-CM | POA: Diagnosis not present

## 2024-02-15 DIAGNOSIS — R42 Dizziness and giddiness: Secondary | ICD-10-CM | POA: Diagnosis not present

## 2024-02-21 DIAGNOSIS — R42 Dizziness and giddiness: Secondary | ICD-10-CM | POA: Diagnosis not present

## 2024-02-24 DIAGNOSIS — R42 Dizziness and giddiness: Secondary | ICD-10-CM | POA: Diagnosis not present

## 2024-02-29 DIAGNOSIS — R42 Dizziness and giddiness: Secondary | ICD-10-CM | POA: Diagnosis not present

## 2024-03-01 DIAGNOSIS — R42 Dizziness and giddiness: Secondary | ICD-10-CM | POA: Diagnosis not present

## 2024-03-04 DIAGNOSIS — G250 Essential tremor: Secondary | ICD-10-CM

## 2024-03-05 MED ORDER — PROPRANOLOL HCL 10 MG PO TABS: ORAL_TABLET | ORAL | 2 refills | Status: DC

## 2024-03-06 DIAGNOSIS — R42 Dizziness and giddiness: Secondary | ICD-10-CM | POA: Diagnosis not present

## 2024-03-14 DIAGNOSIS — R42 Dizziness and giddiness: Secondary | ICD-10-CM | POA: Diagnosis not present

## 2024-03-16 DIAGNOSIS — R42 Dizziness and giddiness: Secondary | ICD-10-CM | POA: Diagnosis not present

## 2024-03-20 DIAGNOSIS — R42 Dizziness and giddiness: Secondary | ICD-10-CM | POA: Diagnosis not present

## 2024-03-22 ENCOUNTER — Ambulatory Visit (INDEPENDENT_AMBULATORY_CARE_PROVIDER_SITE_OTHER): Admitting: Pulmonary Disease

## 2024-03-22 ENCOUNTER — Telehealth: Payer: Self-pay

## 2024-03-22 ENCOUNTER — Encounter: Payer: Self-pay | Admitting: Pulmonary Disease

## 2024-03-22 VITALS — BP 116/66 | HR 82 | Temp 97.7°F | Ht 68.0 in | Wt 104.4 lb

## 2024-03-22 DIAGNOSIS — Z87891 Personal history of nicotine dependence: Secondary | ICD-10-CM

## 2024-03-22 DIAGNOSIS — R0602 Shortness of breath: Secondary | ICD-10-CM

## 2024-03-22 DIAGNOSIS — I5189 Other ill-defined heart diseases: Secondary | ICD-10-CM

## 2024-03-22 DIAGNOSIS — H8110 Benign paroxysmal vertigo, unspecified ear: Secondary | ICD-10-CM | POA: Diagnosis not present

## 2024-03-22 DIAGNOSIS — H6503 Acute serous otitis media, bilateral: Secondary | ICD-10-CM | POA: Diagnosis not present

## 2024-03-22 DIAGNOSIS — K224 Dyskinesia of esophagus: Secondary | ICD-10-CM

## 2024-03-22 DIAGNOSIS — K219 Gastro-esophageal reflux disease without esophagitis: Secondary | ICD-10-CM

## 2024-03-22 DIAGNOSIS — J3089 Other allergic rhinitis: Secondary | ICD-10-CM

## 2024-03-22 DIAGNOSIS — J479 Bronchiectasis, uncomplicated: Secondary | ICD-10-CM | POA: Diagnosis not present

## 2024-03-22 DIAGNOSIS — M0579 Rheumatoid arthritis with rheumatoid factor of multiple sites without organ or systems involvement: Secondary | ICD-10-CM | POA: Diagnosis not present

## 2024-03-22 DIAGNOSIS — J849 Interstitial pulmonary disease, unspecified: Secondary | ICD-10-CM

## 2024-03-22 MED ORDER — MOMETASONE FUROATE 50 MCG/ACT NA SUSP: 2.0000 | Freq: Every day | NASAL | 2 refills | Status: DC

## 2024-03-22 MED ORDER — ESOMEPRAZOLE MAGNESIUM 20 MG PO CPDR: 20.0000 mg | DELAYED_RELEASE_CAPSULE | Freq: Two times a day (BID) | ORAL | 3 refills | Status: DC

## 2024-03-22 NOTE — Progress Notes (Signed)
 Subjective:    Patient ID: Zanovia Rotz, female    DOB: 1932/09/23, 89 y.o.   MRN: [id number]  Patient Care Team: [name], NP as PCP - General (Internal Medicine) Perla Evalene PARAS, MD as PCP - Cardiology (Cardiology) Perla Evalene PARAS, MD as Consulting Physician (Cardiology) Dolphus Reiter, MD as Consulting Physician (Rheumatology) Tamea Dedra CROME, MD as Consulting Physician (Pulmonary Disease)  Chief Complaint  Patient presents with   Follow-up    Shortness of breath on exertion. Cough and wheezing.     BACKGROUND/INTERVAL: 89 y.o. with remote minimal smoking history (15 PY, quit 1982) initially evaluated by Dr Theta 03/2016 and subsequently by Dr Geronimo for pulmonary fibrosis. Pt has prior history of severe ARDS (1999) and had been on chronic nitrofurantoin  for recurrent UTIs. She has been tried on 2 maintenance inhalers without discernible benefit.  Since her prior visit on 04 November 2023 she has not had any exacerbations.  At her prior visit she was darted on Nexium  twice a day due to significant gastroesophageal reflux related to esophageal dysmotility   PROBLEMS: History of prior ARDS 1999 Prior long term nitrofurantoin  therapy Pulmonary fibrosis Bronchiectasis Mild COPD Rheumatoid arthritis (RF/CCP positive) Dysphagia with chronic silent aspiration  HPI Discussed the use of AI scribe software for clinical note transcription with the patient, who gave verbal consent to proceed.  History of Present Illness   Bostyn Kunkler is a 89 year old female with pulmonary fibrosis who presents for follow-up of the same.  Since her last visit she has had issues with recurrent vertigo.   She has been experiencing vertigo for the past eight weeks, which has not responded to meclizine . A similar episode occurred last fall and resolved after six weeks of physical therapy. The current episode has slightly improved over the past two days, but she remains concerned  about its persistence. She is actively participating in physical therapy and performing eye exercises. The vertigo is accompanied by slight nausea, affecting her appetite.  Due to the vertigo, she has significantly reduced her physical activity out of fear of falling, leading to decreased muscle use and increased shortness of breath. She describes a band-like sensation around her ribs and pain with deep breaths, feeling progressively weaker and more short of breath. She denies using oxygen  at night and experiences shortness of breath primarily with exertion, such as walking up a slight incline.  She has a long-standing history of allergies since childhood, which she believes may be contributing to her current symptoms. She is not currently taking any antihistamines.  She has been on propranolol  for essential tremors for many years and recently learned it may exacerbate breathing disorders in individuals with asthma. She is not currently experiencing asthma symptoms.  She has never had asthma.  We reassured her.    DATA: CT chest 03/22/16: Generalized pulmonary hyperinflation but without bullous emphysema. Widespread bronchiectasis with bronchial wall thickening consistent with inflammatory bronchitis. Areas of pulmonary fibrosis, most pronounced in the lower lobes, where the bronchiectasis is more extensive, including areas of pulmonary lung destruction HRCT chest 06/11/16: Diffuse cylindrical and varicoid bronchiectasis throughout both lungs, most severe at the lung bases. Basilar predominant fibrotic interstitial lung disease with patchy honeycombing, with mild progression in the short interval since 03/22/2016 Spirometry 06/18/16: Mild obstruction, FEV1 1.42 liters (63%), FVC 2.43 (80%) Bronchoscopy 07/05/16: Normal airway exam. BAL negative for AFB Echocardiogram 10/07/15: LVEF 60%, Grade I DD, mild MR, RVSP estimate 35 mmHg HRCT 11/04/16: again compatible with interstitial lung  disease, and  considered diagnostic of usual interstitial pneumonia (UIP) from an imaging standpoint. There has been no significant progression of disease compared to the recent prior examination PFT 02/10/17: no obstruction, normal TLC, moderate reduction in DLCO 03/11/17: 345 m. No desaturation PFTs 11/22/17: No obstruction.  Lung volumes normal.  DLCO moderately reduced (55% predicted). NSC since 02/10/17 PFTs 11/23/18 : FVC: 2.67 L (95 %pred), FEV1: 2.35 L (112 %pred), FEV1/FVC: 88%, TLC: 4.98 L (88 %pred), DLCO 42 %pred 2D echo 11/16/2019:Left ventricular ejection fraction, by estimation, is 60 to 65%. Left ventricular diastolic parameters were normal.Right ventricular systolic function is normal. The right ventricular size is normal. Mildly elevated pulmonary artery systolic pressure. The estimated right ventricular systolic pressure is 39.4 mmHg. Left atrial size was mildly dilated.Tricuspid valve regurgitation is mild to moderate CT chest 02/15/2020:The appearance of the lungs is compatible with interstitial lung disease, with a spectrum of findings considered diagnostic of usual interstitial pneumonia (UIP) per current ATS guidelines. Minimal progression compared to the prior examination. Barium swallow study 03/05/2020: Tertiary contractions of the esophagus mild spasm, one episode of tracheal aspiration noted without cough reflex, mild GERD. Overnight pulse oximetry 08/18/2020: No evidence of significant nocturnal desaturation. PFTs 08/31/2020: FEV1 2.16 L or 102% predicted, FVC 2.30 L or 80% predicted, FEV1 FVC 94% predicted, no bronchodilator response.  TLC 4.46 L or 78% predicted, DLCO 37% predicted. QuantiFERON gold 03/12/2021: Negative. CT high-res 03/24/2021: Progressively worsening interstitial lung disease likely UIP.  Markedly worsening bronchiectasis in the right lower lobe with acute airspace consolidation.  Pneumonia considered.  Trace partially loculated right pleural effusion. PFTs  03/25/2021: FEV1 1.38 L or 66% predicted, FVC 1.90 L or 67% predicted FEV1/FVC 73% no bronchodilator response.  Air-trapping noted.  Overall worsening on FEV1 from prior likely related to acute illness diffusion capacity severely reduced. Scleroderma panel 04/01/2021: Negative Chest CT without contrast 05/05/2021: No significant change in pulmonary fibrosis bronchiectasis and areas of honeycombing at the lung bases.  Are consistent with UIP.  The previously noted infiltrate has resolved. PFTs 06/02/2021: FEV1 1.59 L or 76% predicted, FVC 2.08 L or 74% predicted, FEV1/FVC 76%, no bronchodilator response.  Lung volumes normal, improvement from prior PFTs noted.  Diffusion capacity severely reduced however improved from prior. 2D echocardiogram 07/09/2021: LVEF 60 to 65%, grade 2 DD, mildly elevated pulmonary artery systolic pressure, RV systolic pressure 40.9 mmHg mild aortic sclerosis without stenosis.  Stable from prior. Swallow evaluation 07/10/2021: Normal swallow function.  No laryngeal penetration or tracheal aspiration. Chest x-ray PA and lateral 01/14/2022: Findings consistent with chronic pulmonary fibrosis and senescent changes, small right pleural effusion, 10 mm nodular density in the right perihilar region unchanged from 2020 CT chest. Chest x-ray PA and lateral 16 June 2023: Pulmonary fibrosis, pleural scarring on right base, unchanged.  No acute abnormality of the lungs.  Review of Systems A 10 point review of systems was performed and it is as noted above otherwise negative.   Patient Active Problem List   Diagnosis Date Noted   Vertigo 01/25/2024   Elevated brain natriuretic peptide (BNP) level 01/29/2022   Diastolic dysfunction with acute on chronic heart failure (HCC) 01/29/2022   Baker's cyst, right 01/14/2022   Asymptomatic varicose veins of both lower extremities 01/14/2022   DOE (dyspnea on exertion) 01/14/2022   Compression fracture of thoracic spine, non-traumatic  (HCC) 11/26/2021   Tremor of both hands 11/26/2021   B12 deficiency 09/02/2020   Anemia 08/06/2020   TMJ (temporomandibular joint syndrome) 03/02/2018  RA (rheumatoid arthritis) (HCC) 07/13/2017   Polyarthralgia 04/25/2017   ILD (interstitial lung disease) (HCC) 06/18/2016   IPF (idiopathic pulmonary fibrosis) (HCC) 03/23/2016   Bronchiectasis without complication (HCC) 03/23/2016   Allergic rhinitis 03/22/2016   Anal fissure 08/18/2015   Insomnia 08/18/2015   Osteoporosis 02/13/2015   Stress due to illness of family member 09/16/2014   Hiatal hernia 10/13/2012   GERD (gastroesophageal reflux disease)    Allergy     Social History   Tobacco Use   Smoking status: Former    Current packs/day: 0.00    Average packs/day: 0.3 packs/day for 20.0 years (5.0 ttl pk-yrs)    Types: Cigarettes    Start date: 01/16/1961    Quit date: 01/16/1981    Years since quitting: 43.2   Smokeless tobacco: Never  Substance Use Topics   Alcohol use: Yes    Alcohol/week: 7.0 standard drinks of alcohol    Types: 7 Standard drinks or equivalent per week    No Known Allergies  Current Meds  Medication Sig   azaTHIOprine  (IMURAN ) 50 MG tablet TAKE ONE AND ONE-HALF TABLETS (75 MG) EVERY MORNING AND 1 TABLET (50 MG) EVERY EVENING   azithromycin  (ZITHROMAX ) 250 MG tablet Take 1 tablet 3x a week   denosumab  (PROLIA ) 60 MG/ML SOLN injection Inject 60 mg into the skin every 6 (six) months. Administer in upper arm, thigh, or abdomen   gabapentin  (NEURONTIN ) 100 MG capsule Take 2 capsules (200 mg total) by mouth at bedtime.   mometasone  (NASONEX ) 50 MCG/ACT nasal spray Place 2 sprays into the nose daily.   montelukast  (SINGULAIR ) 10 MG tablet Take 1 tablet (10 mg total) by mouth daily.   Multiple Minerals-Vitamins (CALCIUM & VIT D3 BONE HEALTH PO) Take 1 Dose by mouth daily.   predniSONE  (DELTASONE ) 1 MG tablet Take 3 mg by mouth daily with breakfast.   propranolol  (INDERAL ) 10 MG tablet TAKE 1 TABLET  TWICE A DAY FOR TREMOR   traZODone  (DESYREL ) 50 MG tablet Take 1 tablet (50 mg total) by mouth at bedtime as needed for sleep. For sleep.   vitamin B-12 (CYANOCOBALAMIN ) 500 MCG tablet Take 500 mcg by mouth daily.   [DISCONTINUED] esomeprazole  (NEXIUM ) 20 MG capsule Take 1 capsule (20 mg total) by mouth 2 (two) times daily before a meal.   Current Facility-Administered Medications for the 03/22/24 encounter (Office Visit) with Tamea Dedra CROME, MD  Medication   denosumab  (PROLIA ) injection 60 mg   [START ON 08/04/2024] denosumab  (PROLIA ) injection 60 mg    Immunization History  Administered Date(s) Administered   Fluad Quad(high Dose 65+) 07/24/2019, 07/04/2022, 07/28/2023   Influenza Split 06/04/2012   Influenza, High Dose Seasonal PF 09/10/2016   Influenza,inj,Quad PF,6+ Mos 07/04/2013, 08/18/2015, 07/13/2017, 06/19/2018   Influenza-Unspecified 07/15/2020   Moderna SARS-COV2 Booster Vaccination 05/19/2020, 02/19/2021   Moderna Sars-Covid-2 Vaccination 10/22/2019, 11/19/2019, 04/02/2022   Pneumococcal Conjugate-13 10/06/2011   Pneumococcal Polysaccharide-23 06/19/2018   Zoster, Live 01/14/2007        Objective:     BP 116/66 (BP Location: Left Arm, Patient Position: Sitting, Cuff Size: Normal)   Pulse 82   Temp 97.7 F (36.5 C) (Oral)   Ht 5' 8 (1.727 m)   Wt 104 lb 6.4 oz (47.4 kg)   SpO2 99%   BMI 15.87 kg/m   SpO2: 99 %  GENERAL: Chronically ill-appearing, thin, well-developed woman in no acute distress.  Fully ambulatory.  She is actually quite spry.  She is chronically pale. HEAD: Normocephalic, atraumatic.  EYES: Pupils equal, round, reactive to light.  No scleral icterus. EARS: Wears hearing aids, she has serous otitis bilaterally. MOUTH: Dentition intact, some teeth in poor repair.  Oral mucosa moist.  No thrush. NECK: Supple. No thyromegaly. Trachea midline. No JVD.  No adenopathy. PULMONARY: Good air entry bilaterally.  Coarse breath sounds at the  bases,crackles on the right base, otherwise no adventitious sounds.   CARDIOVASCULAR: S1 and S2. Regular rate and rhythm.  No rubs, murmurs or gallops heard.   ABDOMEN: Nondistended, scaphoid.   MUSCULOSKELETAL: Mild rheumatoid arthritis changes from hands, no active synovitis, no clubbing, no edema. NEUROLOGIC: No overt focal deficit, no gait disturbance, speech is fluent. SKIN: Intact,warm,dry.  Hematoma noted on left leg extending from mid shin to foot, healing, no overt tenderness on palpation, no crepitus. PSYCH: Mood and behavior are normal.        Assessment & Plan:     ICD-10-CM   1. ILD (interstitial lung disease) (HCC)  J84.9     2. Bronchiectasis without complication (HCC)  J47.9     3. Bilateral acute serous otitis media, recurrence not specified  H65.03     4. Benign paroxysmal positional vertigo, unspecified laterality  H81.10     5. Rheumatoid arthritis involving multiple sites with positive rheumatoid factor (HCC)  M05.79     6. Esophageal dysmotility  K22.4     7. Gastroesophageal reflux disease, unspecified whether esophagitis present  K21.9     8. Grade II diastolic dysfunction  I51.89     9. Shortness of breath  R06.02      Orders Placed This Encounter  Procedures   Pulse oximetry, overnight    Standing Status:   Future    Expected Date:   03/31/2024    Expiration Date:   03/24/2025    Scheduling Instructions:     On room air   Meds ordered this encounter  Medications   mometasone  (NASONEX ) 50 MCG/ACT nasal spray    Sig: Place 2 sprays into the nose daily.    Dispense:  1 each    Refill:  2   esomeprazole  (NEXIUM ) 20 MG capsule    Sig: Take 1 capsule (20 mg total) by mouth 2 (two) times daily before a meal.    Dispense:  180 capsule    Refill:  3   Discussion:    Pulmonary fibrosis/ILD Shortness of breath on exertion may be exacerbated by propranolol  for essential tremors. Nocturnal oxygen  desaturation is a potential contributing factor. -  Recheck nighttime oxygen  levels to assess for desaturation - Consider oxygen  supplementation during sleep if desaturation is confirmed  Benign paroxysmal vertigo Vertigo has recurred after initial resolution with physical therapy. Symptoms include slight nausea and decreased physical activity due to fear of falling. Possible Meniere's disease considered. Fluid behind the eardrum, likely due to allergies, may be aggravating the vertigo. - Continue physical therapy for vertigo management - Recommend Claritin for allergy management - Prescribe Nasonex  nasal spray, one spray twice a day to each nostril  Serous otitis media Fluid behind the eardrum, likely due to environmental allergies, may be contributing to vertigo symptoms. - Recommend Claritin for allergy management - Prescribe Nasonex  nasal spray, one spray twice a day to each nostril  Environmental allergies Environmental allergies may be contributing to serous otitis media and vertigo. - Recommend Claritin for allergy management - Prescribe Nasonex  nasal spray, one spray twice a day to each nostril     Advised if symptoms do not improve or  worsen, to please contact office for sooner follow up or seek emergency care.    I spent 40 minutes of dedicated to the care of this patient on the date of this encounter to include pre-visit review of records, face-to-face time with the patient discussing conditions above, post visit ordering of testing, clinical documentation with the electronic health record, making appropriate referrals as documented, and communicating necessary findings to members of the patients care team.     C. Leita Sanders, MD Advanced Bronchoscopy [facility]    *This note was generated using voice recognition software/Dragon and/or AI transcription program.  Despite best efforts to proofread, errors can occur which can change the meaning. Any transcriptional errors that result from this process  are unintentional and may not be fully corrected at the time of dictation.

## 2024-03-22 NOTE — Patient Instructions (Signed)
 VISIT SUMMARY:  During your visit, we discussed your ongoing vertigo and its impact on your daily activities, as well as your pulmonary fibrosis and associated shortness of breath. We also reviewed your history of allergies and their potential contribution to your current symptoms.  YOUR PLAN:  -PULMONARY FIBROSIS: Pulmonary fibrosis is a condition where the lung tissue becomes scarred and stiff, making it difficult to breathe. We will recheck your nighttime oxygen  levels to see if you need oxygen  supplementation during sleep, as your shortness of breath may be worsened by your propranolol  medication.  -BENIGN PAROXYSMAL VERTIGO: Benign paroxysmal vertigo is a condition that causes brief episodes of dizziness. You should continue with physical therapy and start taking Claritin for your allergies. Additionally, use Nasonex nasal spray, one spray in each nostril twice a day.  -SEROUS OTITIS MEDIA: Serous otitis media is the presence of fluid behind the eardrum, often due to allergies. This can contribute to vertigo. You should take Claritin for your allergies and use Nasonex nasal spray, one spray in each nostril twice a day.  -ENVIRONMENTAL ALLERGIES: Environmental allergies can cause symptoms like sneezing, itching, and fluid in the ears, which may worsen your vertigo. To manage your allergies, take Claritin and use Nasonex nasal spray, one spray in each nostril twice a day.  INSTRUCTIONS:  Please follow up with us  after rechecking your nighttime oxygen  levels. Continue with your physical therapy and start the recommended allergy medications. If your symptoms persist or worsen, contact our office for further evaluation.  Will see you in follow-up in 6 to 8 weeks time call sooner should any new problems arise.

## 2024-03-22 NOTE — Telephone Encounter (Signed)
 GNFAOZ:[id number];ONGEXB:[id number];Review Type:Prior Auth;Coverage Start Date:02/21/2024;Coverage End Date:10/03/2098;

## 2024-03-22 NOTE — Telephone Encounter (Signed)
*  Pulm  Pharmacy Patient Advocate Encounter   Received notification from CoverMyMeds that prior authorization for Mometasone Furoate 50MCG/ACT suspension  is required/requested.   Insurance verification completed.   The patient is insured through [employer] .   Per test claim: PA required; PA submitted to above mentioned insurance via CoverMyMeds Key/confirmation #/EOC [id number] Status is pending

## 2024-03-23 DIAGNOSIS — R42 Dizziness and giddiness: Secondary | ICD-10-CM | POA: Diagnosis not present

## 2024-03-24 ENCOUNTER — Encounter: Payer: Self-pay | Admitting: Pulmonary Disease

## 2024-03-26 ENCOUNTER — Encounter: Payer: Self-pay | Admitting: Pulmonary Disease

## 2024-03-27 DIAGNOSIS — M810 Age-related osteoporosis without current pathological fracture: Secondary | ICD-10-CM | POA: Diagnosis not present

## 2024-03-27 DIAGNOSIS — J849 Interstitial pulmonary disease, unspecified: Secondary | ICD-10-CM | POA: Diagnosis not present

## 2024-03-27 DIAGNOSIS — Z79899 Other long term (current) drug therapy: Secondary | ICD-10-CM | POA: Diagnosis not present

## 2024-03-27 DIAGNOSIS — M0579 Rheumatoid arthritis with rheumatoid factor of multiple sites without organ or systems involvement: Secondary | ICD-10-CM | POA: Diagnosis not present

## 2024-03-27 DIAGNOSIS — M47812 Spondylosis without myelopathy or radiculopathy, cervical region: Secondary | ICD-10-CM | POA: Diagnosis not present

## 2024-04-02 DIAGNOSIS — R42 Dizziness and giddiness: Secondary | ICD-10-CM | POA: Diagnosis not present

## 2024-04-04 DIAGNOSIS — R42 Dizziness and giddiness: Secondary | ICD-10-CM | POA: Diagnosis not present

## 2024-04-11 DIAGNOSIS — R42 Dizziness and giddiness: Secondary | ICD-10-CM | POA: Diagnosis not present

## 2024-04-13 DIAGNOSIS — R42 Dizziness and giddiness: Secondary | ICD-10-CM | POA: Diagnosis not present

## 2024-04-16 DIAGNOSIS — R42 Dizziness and giddiness: Secondary | ICD-10-CM | POA: Diagnosis not present

## 2024-04-20 DIAGNOSIS — R42 Dizziness and giddiness: Secondary | ICD-10-CM | POA: Diagnosis not present

## 2024-04-25 ENCOUNTER — Encounter: Payer: Self-pay | Admitting: Pulmonary Disease

## 2024-04-25 DIAGNOSIS — R42 Dizziness and giddiness: Secondary | ICD-10-CM

## 2024-04-25 DIAGNOSIS — J479 Bronchiectasis, uncomplicated: Secondary | ICD-10-CM

## 2024-04-25 MED ORDER — AZITHROMYCIN 250 MG PO TABS: ORAL_TABLET | ORAL | 3 refills | Status: DC

## 2024-04-25 NOTE — Telephone Encounter (Signed)
 Refill sent.

## 2024-04-30 DIAGNOSIS — R110 Nausea: Secondary | ICD-10-CM

## 2024-04-30 DIAGNOSIS — R42 Dizziness and giddiness: Secondary | ICD-10-CM

## 2024-05-01 MED ORDER — PROMETHAZINE HCL 12.5 MG PO TABS: 12.5000 mg | ORAL_TABLET | Freq: Three times a day (TID) | ORAL | 0 refills | Status: DC | PRN

## 2024-05-03 ENCOUNTER — Encounter: Payer: Self-pay | Admitting: Pulmonary Disease

## 2024-05-03 ENCOUNTER — Ambulatory Visit: Admitting: Pulmonary Disease

## 2024-05-03 VITALS — BP 120/62 | HR 103 | Temp 97.1°F | Ht 68.0 in | Wt 99.6 lb

## 2024-05-03 DIAGNOSIS — H8120 Vestibular neuronitis, unspecified ear: Secondary | ICD-10-CM | POA: Diagnosis not present

## 2024-05-03 DIAGNOSIS — R42 Dizziness and giddiness: Secondary | ICD-10-CM

## 2024-05-03 DIAGNOSIS — I5189 Other ill-defined heart diseases: Secondary | ICD-10-CM | POA: Diagnosis not present

## 2024-05-03 DIAGNOSIS — J849 Interstitial pulmonary disease, unspecified: Secondary | ICD-10-CM

## 2024-05-03 DIAGNOSIS — R000 Tachycardia, unspecified: Secondary | ICD-10-CM

## 2024-05-03 DIAGNOSIS — K224 Dyskinesia of esophagus: Secondary | ICD-10-CM

## 2024-05-03 DIAGNOSIS — M0579 Rheumatoid arthritis with rheumatoid factor of multiple sites without organ or systems involvement: Secondary | ICD-10-CM | POA: Diagnosis not present

## 2024-05-03 DIAGNOSIS — J479 Bronchiectasis, uncomplicated: Secondary | ICD-10-CM | POA: Diagnosis not present

## 2024-05-03 DIAGNOSIS — K219 Gastro-esophageal reflux disease without esophagitis: Secondary | ICD-10-CM

## 2024-05-03 MED ORDER — LORAZEPAM 0.5 MG PO TABS: 0.5000 mg | ORAL_TABLET | Freq: Three times a day (TID) | ORAL | 0 refills | Status: DC | PRN

## 2024-05-03 MED ORDER — ONDANSETRON HCL 4 MG PO TABS: 4.0000 mg | ORAL_TABLET | Freq: Every day | ORAL | 1 refills | Status: DC | PRN

## 2024-05-03 NOTE — Patient Instructions (Addendum)
 VISIT SUMMARY:  During your visit, we discussed your worsening dizziness and nausea, which have been affecting your daily activities. We also reviewed your hearing issues and other health concerns, including rheumatoid arthritis and unintentional weight loss.  YOUR PLAN:  -VERTIGO WITH ASSOCIATED DIZZINESS AND NAUSEA: Vertigo is a condition where you feel like you or your surroundings are spinning. We will order an MRI of your brain and refer you to an ENT specialist to evaluate for vestibular neuritis, which is an inner ear problem that can cause vertigo.  I have provided you with a trial of antianxiety medication and Zofran  to see if this helps with your symptoms of vertigo and nausea.  -HEARING LOSS WITH POSSIBLE MIDDLE EAR EFFUSION: Hearing loss can sometimes be caused by fluid buildup in the middle ear. We will refer you to an ENT specialist to evaluate your hearing loss and check for any fluid in your middle ear.  -SINUS ARRHYTHMIA: Sinus arrhythmia is a variation in your heart rate that is generally considered benign and is not contributing to your current symptoms.  -RHEUMATOID ARTHRITIS ON CHRONIC IMMUNOSUPPRESSION: Your rheumatoid arthritis is being managed with low-dose prednisone  and Imuran . Continue your current treatment as prescribed.  -UNINTENTIONAL WEIGHT LOSS: Your significant weight loss is likely due to chronic nausea and decreased appetite from your vertigo and dizziness. We will address this by managing your vertigo and nausea more effectively.  INSTRUCTIONS:  Please follow up with the ENT specialist as soon as possible. We will also schedule an MRI of your brain. If you experience any new or worsening symptoms, please contact our office immediately.

## 2024-05-03 NOTE — Progress Notes (Signed)
 Subjective:    Patient ID: Crystal Gregory, female    DOB: Jul 21, 1932, 89 y.o.   MRN: [id number]  Patient Care Team: [name], NP as PCP - General (Internal Medicine) Perla Evalene PARAS, MD as PCP - Cardiology (Cardiology) Perla Evalene PARAS, MD as Consulting Physician (Cardiology) Dolphus Reiter, MD as Consulting Physician (Rheumatology) Tamea Dedra CROME, MD as Consulting Physician (Pulmonary Disease)  Chief Complaint  Patient presents with   Follow-up    Increased SOB. No wheezing. Cough with green sputum.    BACKGROUND/INTERVAL:89 y.o. with remote minimal smoking history (15 PY, quit 1982) initially evaluated by Dr Theta 03/2016 and subsequently by Dr Geronimo for pulmonary fibrosis. Pt has prior history of severe ARDS (1999) and had been on chronic nitrofurantoin  for recurrent UTIs. She has been tried on 2 maintenance inhalers without discernible benefit.  Since her prior visit on 04 November 2023 she has not had any exacerbations.  At a prior visit she was started on Nexium  twice a day due to significant gastroesophageal reflux related to esophageal dysmotility   PROBLEMS: History of prior ARDS 1999 Prior long term nitrofurantoin  therapy Pulmonary fibrosis Bronchiectasis Mild COPD Rheumatoid arthritis (RF/CCP positive) Dysphagia with chronic silent aspiration  HPI Discussed the use of AI scribe software for clinical note transcription with the patient, who gave verbal consent to proceed.  History of Present Illness   Crystal Gregory is a 89 year old female who presents with worsening dizziness and nausea.  She presents today with her daughter.  She was referred by Mallie Gretta to see an ENT for her vertigo, appointment is pending.  She has been experiencing vertigo for approximately three months, which has progressively worsened. Initially manageable, her dizziness now makes the room feel like it is moving, and she is unable to drive or perform daily activities  independently. She finds personal care, such as showering, to be a challenge due to her dizziness.  A CT scan of the brain performed 26 January 2024 revealed a sinus infection, which was treated with antibiotics, resolving the infection. However, she continues to experience fullness in her ears and a reverberation in her hearing, as if in a tunnel. She wears hearing aids and perceives sounds as if in a tunnel.  She has undergone three months of physical therapy focusing on head movements and balance exercises, but she did not make progress and felt worse by the end of the therapy. She experiences constant nausea and has lost a significant amount of weight. She has tried meclizine  and Phenergan  (12.5 mg three times a day) without relief.  Her dizziness does not occur when lying flat on her back, allowing her to sleep for extended periods without dizziness.   Her dyspnea is about baseline, recall she has pulmonary fibrosis and bronchiectasis.  She does note that when she is dizzy she feels fatigue and her dyspnea may worsen some.  She does not endorse any other symptomatology.  Of note she has been on Prolia  she believes last dose was approximately 2 months ago     DATA: CT chest 03/22/16: Generalized pulmonary hyperinflation but without bullous emphysema. Widespread bronchiectasis with bronchial wall thickening consistent with inflammatory bronchitis. Areas of pulmonary fibrosis, most pronounced in the lower lobes, where the bronchiectasis is more extensive, including areas of pulmonary lung destruction HRCT chest 06/11/16: Diffuse cylindrical and varicoid bronchiectasis throughout both lungs, most severe at the lung bases. Basilar predominant fibrotic interstitial lung disease with patchy honeycombing, with mild progression in the short  interval since 03/22/2016 Spirometry 06/18/16: Mild obstruction, FEV1 1.42 liters (63%), FVC 2.43 (80%) Bronchoscopy 07/05/16: Normal airway exam. BAL negative for  AFB Echocardiogram 10/07/15: LVEF 60%, Grade I DD, mild MR, RVSP estimate 35 mmHg HRCT 11/04/16: again compatible with interstitial lung disease, and considered diagnostic of usual interstitial pneumonia (UIP) from an imaging standpoint. There has been no significant progression of disease compared to the recent prior examination PFT 02/10/17: no obstruction, normal TLC, moderate reduction in DLCO 03/11/17: 345 m. No desaturation PFTs 11/22/17: No obstruction.  Lung volumes normal.  DLCO moderately reduced (55% predicted). NSC since 02/10/17 PFTs 11/23/18 : FVC: 2.67 L (95 %pred), FEV1: 2.35 L (112 %pred), FEV1/FVC: 88%, TLC: 4.98 L (88 %pred), DLCO 42 %pred 2D echo 11/16/2019:Left ventricular ejection fraction, by estimation, is 60 to 65%. Left ventricular diastolic parameters were normal.Right ventricular systolic function is normal. The right ventricular size is normal. Mildly elevated pulmonary artery systolic pressure. The estimated right ventricular systolic pressure is 39.4 mmHg. Left atrial size was mildly dilated.Tricuspid valve regurgitation is mild to moderate CT chest 02/15/2020:The appearance of the lungs is compatible with interstitial lung disease, with a spectrum of findings considered diagnostic of usual interstitial pneumonia (UIP) per current ATS guidelines. Minimal progression compared to the prior examination. Barium swallow study 03/05/2020: Tertiary contractions of the esophagus mild spasm, one episode of tracheal aspiration noted without cough reflex, mild GERD. Overnight pulse oximetry 08/18/2020: No evidence of significant nocturnal desaturation. PFTs 08/31/2020: FEV1 2.16 L or 102% predicted, FVC 2.30 L or 80% predicted, FEV1 FVC 94% predicted, no bronchodilator response.  TLC 4.46 L or 78% predicted, DLCO 37% predicted. QuantiFERON gold 03/12/2021: Negative. CT high-res 03/24/2021: Progressively worsening interstitial lung disease likely UIP.  Markedly worsening  bronchiectasis in the right lower lobe with acute airspace consolidation.  Pneumonia considered.  Trace partially loculated right pleural effusion. PFTs 03/25/2021: FEV1 1.38 L or 66% predicted, FVC 1.90 L or 67% predicted FEV1/FVC 73% no bronchodilator response.  Air-trapping noted.  Overall worsening on FEV1 from prior likely related to acute illness diffusion capacity severely reduced. Scleroderma panel 04/01/2021: Negative Chest CT without contrast 05/05/2021: No significant change in pulmonary fibrosis bronchiectasis and areas of honeycombing at the lung bases.  Are consistent with UIP.  The previously noted infiltrate has resolved. PFTs 06/02/2021: FEV1 1.59 L or 76% predicted, FVC 2.08 L or 74% predicted, FEV1/FVC 76%, no bronchodilator response.  Lung volumes normal, improvement from prior PFTs noted.  Diffusion capacity severely reduced however improved from prior. 2D echocardiogram 07/09/2021: LVEF 60 to 65%, grade 2 DD, mildly elevated pulmonary artery systolic pressure, RV systolic pressure 40.9 mmHg mild aortic sclerosis without stenosis.  Stable from prior. Swallow evaluation 07/10/2021: Normal swallow function.  No laryngeal penetration or tracheal aspiration. Chest x-ray PA and lateral 01/14/2022: Findings consistent with chronic pulmonary fibrosis and senescent changes, small right pleural effusion, 10 mm nodular density in the right perihilar region unchanged from 2020 CT chest. Chest x-ray PA and lateral 06/16/2023: Pulmonary fibrosis, pleural scarring on right base, unchanged.  No acute abnormality of the lungs. CXR PA and lateral 11/04/2023: Pulmonary fibrosis, pleural scarring on the right base, unchanged.  No acute abnormality. CT head 01/26/2024: No acute intracranial pathology, mild age-related atrophy and chronic microvascular ischemic changes, left maxillary sinus disease.  Review of Systems A 10 point review of systems was performed and it is as noted above otherwise  negative.   Patient Active Problem List   Diagnosis Date Noted   Vertigo  01/25/2024   Elevated brain natriuretic peptide (BNP) level 01/29/2022   Diastolic dysfunction with acute on chronic heart failure (HCC) 01/29/2022   Baker's cyst, right 01/14/2022   Asymptomatic varicose veins of both lower extremities 01/14/2022   DOE (dyspnea on exertion) 01/14/2022   Compression fracture of thoracic spine, non-traumatic (HCC) 11/26/2021   Tremor of both hands 11/26/2021   B12 deficiency 09/02/2020   Anemia 08/06/2020   TMJ (temporomandibular joint syndrome) 03/02/2018   RA (rheumatoid arthritis) (HCC) 07/13/2017   Polyarthralgia 04/25/2017   ILD (interstitial lung disease) (HCC) 06/18/2016   IPF (idiopathic pulmonary fibrosis) (HCC) 03/23/2016   Bronchiectasis without complication (HCC) 03/23/2016   Allergic rhinitis 03/22/2016   Anal fissure 08/18/2015   Insomnia 08/18/2015   Osteoporosis 02/13/2015   Stress due to illness of family member 09/16/2014   Hiatal hernia 10/13/2012   GERD (gastroesophageal reflux disease)    Allergy     Social History   Tobacco Use   Smoking status: Former    Current packs/day: 0.00    Average packs/day: 0.3 packs/day for 20.0 years (5.0 ttl pk-yrs)    Types: Cigarettes    Start date: 01/16/1961    Quit date: 01/16/1981    Years since quitting: 43.3   Smokeless tobacco: Never  Substance Use Topics   Alcohol use: Yes    Alcohol/week: 7.0 standard drinks of alcohol    Types: 7 Standard drinks or equivalent per week    No Known Allergies  Current Meds  Medication Sig   azaTHIOprine  (IMURAN ) 50 MG tablet TAKE ONE AND ONE-HALF TABLETS (75 MG) EVERY MORNING AND 1 TABLET (50 MG) EVERY EVENING   azithromycin  (ZITHROMAX ) 250 MG tablet Take 1 tablet 3x a week   denosumab  (PROLIA ) 60 MG/ML SOLN injection Inject 60 mg into the skin every 6 (six) months. Administer in upper arm, thigh, or abdomen   esomeprazole  (NEXIUM ) 20 MG capsule Take 1 capsule (20 mg  total) by mouth 2 (two) times daily before a meal.   gabapentin  (NEURONTIN ) 100 MG capsule Take 2 capsules (200 mg total) by mouth at bedtime.   LORazepam  (ATIVAN ) 0.5 MG tablet Take 1 tablet (0.5 mg total) by mouth every 8 (eight) hours as needed (Vertigo).   mometasone  (NASONEX ) 50 MCG/ACT nasal spray Place 2 sprays into the nose daily.   montelukast  (SINGULAIR ) 10 MG tablet Take 1 tablet (10 mg total) by mouth daily.   Multiple Minerals-Vitamins (CALCIUM & VIT D3 BONE HEALTH PO) Take 1 Dose by mouth daily.   ondansetron  (ZOFRAN ) 4 MG tablet Take 1 tablet (4 mg total) by mouth daily as needed for nausea or vomiting.   predniSONE  (DELTASONE ) 1 MG tablet Take 3 mg by mouth daily with breakfast.   promethazine  (PHENERGAN ) 12.5 MG tablet Take 1 tablet (12.5 mg total) by mouth every 8 (eight) hours as needed for nausea or vomiting.   propranolol  (INDERAL ) 10 MG tablet TAKE 1 TABLET TWICE A DAY FOR TREMOR   traZODone  (DESYREL ) 50 MG tablet Take 1 tablet (50 mg total) by mouth at bedtime as needed for sleep. For sleep.   vitamin B-12 (CYANOCOBALAMIN ) 500 MCG tablet Take 500 mcg by mouth daily.   Current Facility-Administered Medications for the 05/03/24 encounter (Office Visit) with Tamea Dedra CROME, MD  Medication   denosumab  (PROLIA ) injection 60 mg   [START ON 08/04/2024] denosumab  (PROLIA ) injection 60 mg    Immunization History  Administered Date(s) Administered   Fluad Quad(high Dose 65+) 07/24/2019, 07/04/2022, 07/28/2023   Influenza  Split 06/04/2012   Influenza, High Dose Seasonal PF 09/10/2016   Influenza,inj,Quad PF,6+ Mos 07/04/2013, 08/18/2015, 07/13/2017, 06/19/2018   Influenza-Unspecified 07/15/2020   Moderna SARS-COV2 Booster Vaccination 05/19/2020, 02/19/2021   Moderna Sars-Covid-2 Vaccination 10/22/2019, 11/19/2019, 04/02/2022   Pneumococcal Conjugate-13 10/06/2011   Pneumococcal Polysaccharide-23 06/19/2018   Zoster, Live 01/14/2007        Objective:     BP 120/62  (BP Location: Right Arm, Cuff Size: Normal)   Pulse (!) 103   Temp (!) 97.1 F (36.2 C)   Ht 5' 8 (1.727 m)   Wt 99 lb 9.6 oz (45.2 kg)   SpO2 98%   BMI 15.14 kg/m   SpO2: 98 % O2 Device: None (Room air)  GENERAL: Chronically ill-appearing, thin, well-developed woman in no acute distress though she appears uncomfortable due to dizziness.  Ambulatory with assistance from daughter. She is chronically pale. HEAD: Normocephalic, atraumatic. EYES: Pupils equal, round, reactive to light.  No scleral icterus. EARS: Wears hearing aids, she has serous otitis bilaterally, improved from prior. MOUTH: Dentition intact, some teeth in poor repair.  Oral mucosa moist.  No thrush. NECK: Supple. No thyromegaly. Trachea midline. No JVD.  No adenopathy. PULMONARY: Good air entry bilaterally.  Coarse breath sounds at the bases,crackles on the right base (chronic), otherwise no adventitious sounds.   CARDIOVASCULAR: S1 and S2.  Tachycardic with varying rate.  No rubs, murmurs or gallops heard.   ABDOMEN: Nondistended, scaphoid.   MUSCULOSKELETAL: Mild rheumatoid arthritis changes from hands, no active synovitis, no clubbing, no edema. NEUROLOGIC: No overt focal deficit, unsteady gait, speech is fluent. SKIN: Intact,warm,dry.   PSYCH: Mood and behavior are normal.  EKG: Sinus arrhythmia rate 97, no ST-T wave changes, WNL.   Assessment & Plan:     ICD-10-CM   1. Bronchiectasis without complication (HCC)  J47.9     2. Vertigo  R42 MR BRAIN W WO CONTRAST    3. Vestibular neuritis, unspecified laterality - suspected  H81.20     4. ILD (interstitial lung disease) (HCC)  J84.9     5. Tachycardia  R00.0 EKG 12-Lead    6. Rheumatoid arthritis involving multiple sites with positive rheumatoid factor (HCC)  M05.79     7. Esophageal dysmotility  K22.4     8. Gastroesophageal reflux disease, unspecified whether esophagitis present  K21.9     9. Grade II diastolic dysfunction  I51.89       Orders  Placed This Encounter  Procedures   MR BRAIN W WO CONTRAST    Standing Status:   Future    Expected Date:   05/10/2024    Expiration Date:   05/03/2025    Scheduling Instructions:     ASAP please    If indicated for the ordered procedure, I authorize the administration of contrast media per Radiology protocol:   Yes    What is the patient's sedation requirement?:   No Sedation    Does the patient have a pacemaker or implanted devices?:   No    Preferred imaging location?:   [facility] (table limit - 500lbs)   EKG 12-Lead    Meds ordered this encounter  Medications   ondansetron  (ZOFRAN ) 4 MG tablet    Sig: Take 1 tablet (4 mg total) by mouth daily as needed for nausea or vomiting.    Dispense:  30 tablet    Refill:  1   LORazepam  (ATIVAN ) 0.5 MG tablet    Sig: Take 1 tablet (0.5 mg total) by  mouth every 8 (eight) hours as needed (Vertigo).    Dispense:  20 tablet    Refill:  0   Discussion:    Vertigo with associated dizziness and nausea Chronic vertigo and dizziness worsening over three months, affecting daily activities. Previous CT scan showed sinus infection, but no significant findings related to vertigo. Physical therapy and medications (Meclizine , Phenergan ) were ineffective. Considering vestibular neuritis as a differential diagnosis. - Order MRI of the brain - Referred to ENT by primary provider for evaluation of vestibular neuritis, other causes of vertigo - Consider consult to neurology for further evaluation pending MRI - Consider anti-anxiety medication for dizziness (Ativan  0.5 mg every 8 hours as needed) - Consider trial of Zofran  for nausea - If workup negative, consider discontinuation of Prolia  as vertigo is one of the side effects of the medication  Hearing loss with possible middle ear effusion Hearing loss with sensation of hearing in a tunnel, possibly due to middle ear effusion. Previous treatment with Nasonex  and antihistamines have helped, but fluid  still present though markedly reduced. - Refer to ENT for evaluation of hearing loss and possible middle ear effusion  Sinus arrhythmia Sinus arrhythmia noted on EKG, considered benign and not contributing to current symptoms.  Rheumatoid arthritis on chronic immunosuppression Rheumatoid arthritis managed with low-dose prednisone  and Imuran .  Unintentional weight loss Significant unintentional weight loss likely related to chronic nausea and decreased oral intake due to vertigo and dizziness.      Advised if symptoms do not improve or worsen, to please contact office for sooner follow up or seek emergency care.    I spent 50 minutes of dedicated to the care of this patient on the date of this encounter to include pre-visit review of records, face-to-face time with the patient discussing conditions above, post visit ordering of testing, clinical documentation with the electronic health record, making appropriate referrals as documented, and communicating necessary findings to members of the patients care team.     C. Leita Sanders, MD Advanced Bronchoscopy PCCM [facility]    *This note was generated using voice recognition software/Dragon and/or AI transcription program.  Despite best efforts to proofread, errors can occur which can change the meaning. Any transcriptional errors that result from this process are unintentional and may not be fully corrected at the time of dictation.

## 2024-05-07 ENCOUNTER — Telehealth: Payer: Self-pay

## 2024-05-07 NOTE — Telephone Encounter (Signed)
 Copied from CRM [id number]. Topic: Clinical - Medication Question >> May 07, 2024 10:51 AM Crystal Gregory wrote: Reason for CRM: Patient needs order for a fleet. Patient said that NP Mallie knows what she is talking about and would appreciate a call back. Patient said that nurse at her place requested it and advised patient to give her clinic a call.   [phone] (H) >> May 07, 2024 10:53 AM Crystal Gregory wrote: Patient spoke with an independent nurse at [facility] and was advised to contact provider for a fleet.

## 2024-05-07 NOTE — Telephone Encounter (Signed)
 I'm not sure what she means by fleet, can we get some additional information?  Also, has the promethazine  helped with her nausea and dizziness?

## 2024-05-08 ENCOUNTER — Ambulatory Visit
Admission: RE | Admit: 2024-05-08 | Discharge: 2024-05-08 | Disposition: A | Discharge status: Home / Self Care | Source: Ambulatory Visit | Attending: Pulmonary Disease | Admitting: Pulmonary Disease

## 2024-05-08 DIAGNOSIS — R42 Dizziness and giddiness: Secondary | ICD-10-CM | POA: Insufficient documentation

## 2024-05-08 DIAGNOSIS — R29818 Other symptoms and signs involving the nervous system: Secondary | ICD-10-CM | POA: Diagnosis not present

## 2024-05-08 DIAGNOSIS — G319 Degenerative disease of nervous system, unspecified: Secondary | ICD-10-CM | POA: Diagnosis not present

## 2024-05-08 DIAGNOSIS — R9082 White matter disease, unspecified: Secondary | ICD-10-CM | POA: Diagnosis not present

## 2024-05-08 MED ORDER — GADOBUTROL 1 MMOL/ML IV SOLN
5.0000 mL | Freq: Once | INTRAVENOUS | Status: AC | PRN
  Administered 2024-05-08: 5 mL via INTRAVENOUS

## 2024-05-08 NOTE — Telephone Encounter (Signed)
 Noted. Rx placed in Kelli's inbox

## 2024-05-08 NOTE — Telephone Encounter (Signed)
  Happy to provide the order, but has she been experiencing constipation recently?  Did she already received a Fleet enema or if she waiting on one?

## 2024-05-08 NOTE — Telephone Encounter (Signed)
 Spoke with patient states since being put on the Zofran  she has had issues with constipation. States the zofran  has been highly effective for her. She has been given two fleet enemas already, [facility] needs order for documentation. She is currently taking stool softeners per the independent living nurse suggestion. Pts constipation has improved after the enemas.

## 2024-05-08 NOTE — Telephone Encounter (Signed)
 Faxed to [facility] Attn to [place]

## 2024-05-08 NOTE — Telephone Encounter (Signed)
 Called and spoke with patient she is needing an order for a fleet enema sent to Janci Minor, the independent living nurse at [facility] to satisfy their requirements.  Pt states the promethazine  was ineffective, Dr. Tamea put her on Zofran  for the nausea and Lorazepam  for anxiety.

## 2024-05-11 ENCOUNTER — Encounter: Payer: Self-pay | Admitting: Pulmonary Disease

## 2024-05-11 MED ORDER — METHYLPREDNISOLONE 4 MG PO TBPK
ORAL_TABLET | ORAL | 0 refills | Status: DC
Start: 2024-05-11 — End: 2024-05-24

## 2024-05-11 NOTE — Telephone Encounter (Signed)
 I have sent her a Medrol  Dosepak prescription.  While she is taking the Medrol  she should hold off on her prednisone  but then resume her regular dose of prednisone  once she finishes the Medrol .

## 2024-05-11 NOTE — Telephone Encounter (Signed)
 The MRI has not been read yet.  These usually take a little while to be read when they are not emergent.  As soon as we have the radiologist interpretation we will let her know.  I am not adverse to sending her a trial of steroids if she would like I can send it to her pharmacy it would be a Medrol  Dosepak.

## 2024-05-15 ENCOUNTER — Ambulatory Visit: Payer: Self-pay | Admitting: Pulmonary Disease

## 2024-05-15 NOTE — Telephone Encounter (Signed)
 I suspect this more of a chronic sinusitis and she received steroids which should help some with that.  But is best not to give antibiotics unless there is a culture because of the chronic nature of it.  Unless she has purulent nasal drainage or fever I would hold off on antibiotics for now.

## 2024-05-24 ENCOUNTER — Other Ambulatory Visit
Admission: RE | Admit: 2024-05-24 | Discharge: 2024-05-24 | Disposition: A | Discharge status: Home / Self Care | Attending: Pulmonary Disease | Admitting: Pulmonary Disease

## 2024-05-24 ENCOUNTER — Ambulatory Visit (INDEPENDENT_AMBULATORY_CARE_PROVIDER_SITE_OTHER): Admitting: Pulmonary Disease

## 2024-05-24 ENCOUNTER — Ambulatory Visit: Payer: Self-pay | Admitting: Pulmonary Disease

## 2024-05-24 VITALS — BP 100/60 | HR 81 | Ht 68.0 in | Wt 102.0 lb

## 2024-05-24 DIAGNOSIS — L659 Nonscarring hair loss, unspecified: Secondary | ICD-10-CM

## 2024-05-24 DIAGNOSIS — J329 Chronic sinusitis, unspecified: Secondary | ICD-10-CM | POA: Diagnosis not present

## 2024-05-24 DIAGNOSIS — R42 Dizziness and giddiness: Secondary | ICD-10-CM

## 2024-05-24 DIAGNOSIS — M0579 Rheumatoid arthritis with rheumatoid factor of multiple sites without organ or systems involvement: Secondary | ICD-10-CM

## 2024-05-24 DIAGNOSIS — K224 Dyskinesia of esophagus: Secondary | ICD-10-CM

## 2024-05-24 DIAGNOSIS — J849 Interstitial pulmonary disease, unspecified: Secondary | ICD-10-CM | POA: Diagnosis not present

## 2024-05-24 DIAGNOSIS — J320 Chronic maxillary sinusitis: Secondary | ICD-10-CM | POA: Diagnosis not present

## 2024-05-24 DIAGNOSIS — J479 Bronchiectasis, uncomplicated: Secondary | ICD-10-CM | POA: Diagnosis not present

## 2024-05-24 LAB — T4, FREE: Free T4: 0.95 ng/dL (ref 0.61–1.12)

## 2024-05-24 LAB — VITAMIN B12: Vitamin B-12: 384 pg/mL (ref 180–914)

## 2024-05-24 LAB — FOLATE: Folate: 10 ng/mL (ref >5.9)

## 2024-05-24 LAB — TSH: TSH: 1.806 u[IU]/mL (ref 0.350–4.500)

## 2024-05-24 NOTE — Progress Notes (Signed)
 Subjective:    Patient ID: Crystal Gregory, female    DOB: 01-29-1932, 89 y.o.   MRN: [id number]  Patient Care Team: [name], NP as PCP - General (Internal Medicine) Perla Evalene PARAS, MD as PCP - Cardiology (Cardiology) Perla Evalene PARAS, MD as Consulting Physician (Cardiology) Dolphus Reiter, MD as Consulting Physician (Rheumatology) Tamea Dedra CROME, MD as Consulting Physician (Pulmonary Disease)  Chief Complaint  Patient presents with   Interstitial Lung Disease    Vertigo. Shortness of breath on exertion and at rest. Occasional cough.     BACKGROUND/INTERVAL: 89 y.o. with remote minimal smoking history (15 PY, quit 1982) initially evaluated by Dr Theta 03/2016 and subsequently by Dr Geronimo for pulmonary fibrosis. Pt has prior history of severe ARDS (1999) and had been on chronic nitrofurantoin  for recurrent UTIs. She has been tried on 2 maintenance inhalers without discernible benefit.  Since her prior visit on 03 May 2024 she has not had any exacerbations.  Her main issues recently have been severe vertigo.    PROBLEMS: History of prior ARDS 1999 Prior long term nitrofurantoin  therapy Pulmonary fibrosis Bronchiectasis Mild COPD Rheumatoid arthritis (RF/CCP positive) Dysphagia with chronic silent aspiration  HPI Discussed the use of AI scribe software for clinical note transcription with the patient, who gave verbal consent to proceed.  History of Present Illness   Crystal Gregory is a 89 year old female with pulmonary fibrosis and bronchiectasis who presents with vertigo.  She presents with her daughter Augustus  She has been experiencing persistent vertigo for five months, which has significantly impacted her daily life, preventing her from attending yoga classes twice a week as she used to. She is unsure why the vertigo has persisted for so long.  She reports coughing up thick green mucus, particularly at night, which she believes may be related to  sinus issues. She experiences sinus tenderness, which becomes painful during flare-ups. She has a history of pulmonary fibrosis and bronchiectasis.  Additional symptoms include hair not growing and nails becoming ridged and cracking, which she does not recall happening before. She is concerned about these changes and their potential connection to her overall health.  She has been using Zofran  for nausea, which has been effective in allowing her to eat again, but it has resulted in constipation, requiring the use of enemas. She uses Zofran  only when she becomes extremely nauseated.  There is a concern about the potential lingering effects of Prolia , which she has been on, and she believes she has about two months left of its effects in her body.    DATA: CT chest 03/22/16: Generalized pulmonary hyperinflation but without bullous emphysema. Widespread bronchiectasis with bronchial wall thickening consistent with inflammatory bronchitis. Areas of pulmonary fibrosis, most pronounced in the lower lobes, where the bronchiectasis is more extensive, including areas of pulmonary lung destruction HRCT chest 06/11/16: Diffuse cylindrical and varicoid bronchiectasis throughout both lungs, most severe at the lung bases. Basilar predominant fibrotic interstitial lung disease with patchy honeycombing, with mild progression in the short interval since 03/22/2016 Spirometry 06/18/16: Mild obstruction, FEV1 1.42 liters (63%), FVC 2.43 (80%) Bronchoscopy 07/05/16: Normal airway exam. BAL negative for AFB Echocardiogram 10/07/15: LVEF 60%, Grade I DD, mild MR, RVSP estimate 35 mmHg HRCT 11/04/16: again compatible with interstitial lung disease, and considered diagnostic of usual interstitial pneumonia (UIP) from an imaging standpoint. There has been no significant progression of disease compared to the recent prior examination PFT 02/10/17: no obstruction, normal TLC, moderate reduction in DLCO  03/11/17: 345 m. No  desaturation PFTs 11/22/17: No obstruction.  Lung volumes normal.  DLCO moderately reduced (55% predicted). NSC since 02/10/17 PFTs 11/23/18 : FVC: 2.67 L (95 %pred), FEV1: 2.35 L (112 %pred), FEV1/FVC: 88%, TLC: 4.98 L (88 %pred), DLCO 42 %pred 2D echo 11/16/2019:Left ventricular ejection fraction, by estimation, is 60 to 65%. Left ventricular diastolic parameters were normal.Right ventricular systolic function is normal. The right ventricular size is normal. Mildly elevated pulmonary artery systolic pressure. The estimated right ventricular systolic pressure is 39.4 mmHg. Left atrial size was mildly dilated.Tricuspid valve regurgitation is mild to moderate CT chest 02/15/2020:The appearance of the lungs is compatible with interstitial lung disease, with a spectrum of findings considered diagnostic of usual interstitial pneumonia (UIP) per current ATS guidelines. Minimal progression compared to the prior examination. Barium swallow study 03/05/2020: Tertiary contractions of the esophagus mild spasm, one episode of tracheal aspiration noted without cough reflex, mild GERD. Overnight pulse oximetry 08/18/2020: No evidence of significant nocturnal desaturation. PFTs 08/31/2020: FEV1 2.16 L or 102% predicted, FVC 2.30 L or 80% predicted, FEV1 FVC 94% predicted, no bronchodilator response.  TLC 4.46 L or 78% predicted, DLCO 37% predicted. QuantiFERON gold 03/12/2021: Negative. CT high-res 03/24/2021: Progressively worsening interstitial lung disease likely UIP.  Markedly worsening bronchiectasis in the right lower lobe with acute airspace consolidation.  Pneumonia considered.  Trace partially loculated right pleural effusion. PFTs 03/25/2021: FEV1 1.38 L or 66% predicted, FVC 1.90 L or 67% predicted FEV1/FVC 73% no bronchodilator response.  Air-trapping noted.  Overall worsening on FEV1 from prior likely related to acute illness diffusion capacity severely reduced. Scleroderma panel 04/01/2021:  Negative Chest CT without contrast 05/05/2021: No significant change in pulmonary fibrosis bronchiectasis and areas of honeycombing at the lung bases.  Are consistent with UIP.  The previously noted infiltrate has resolved. PFTs 06/02/2021: FEV1 1.59 L or 76% predicted, FVC 2.08 L or 74% predicted, FEV1/FVC 76%, no bronchodilator response.  Lung volumes normal, improvement from prior PFTs noted.  Diffusion capacity severely reduced however improved from prior. 2D echocardiogram 07/09/2021: LVEF 60 to 65%, grade 2 DD, mildly elevated pulmonary artery systolic pressure, RV systolic pressure 40.9 mmHg mild aortic sclerosis without stenosis.  Stable from prior. Swallow evaluation 07/10/2021: Normal swallow function.  No laryngeal penetration or tracheal aspiration. Chest x-ray PA and lateral 01/14/2022: Findings consistent with chronic pulmonary fibrosis and senescent changes, small right pleural effusion, 10 mm nodular density in the right perihilar region unchanged from 2020 CT chest. Chest x-ray PA and lateral 06/16/2023: Pulmonary fibrosis, pleural scarring on right base, unchanged.  No acute abnormality of the lungs. CXR PA and lateral 11/04/2023: Pulmonary fibrosis, pleural scarring on the right base, unchanged.  No acute abnormality. CT head 01/26/2024: No acute intracranial pathology, mild age-related atrophy and chronic microvascular ischemic changes, left maxillary sinus disease. Chest x-ray PA and lateral 11/04/2023: Chronic lung disease without significant change in radiographic appearance.  No acute airspace disease. MRI brain 05/08/2024: No acute intracranial abnormality age-related atrophy complete opacification of the left maxillary sinus.  Review of Systems A 10 point review of systems was performed and it is as noted above otherwise negative.   Patient Active Problem List   Diagnosis Date Noted   Protein-calorie malnutrition, severe 06/07/2024   Atrial fibrillation with rapid  ventricular response (HCC) 06/05/2024   Hyponatremia 06/05/2024   Multilobar lung infiltrate 06/05/2024   Vertigo 01/25/2024   Elevated brain natriuretic peptide (BNP) level 01/29/2022   Diastolic dysfunction with acute on chronic heart  failure (HCC) 01/29/2022   Baker's cyst, right 01/14/2022   Asymptomatic varicose veins of both lower extremities 01/14/2022   DOE (dyspnea on exertion) 01/14/2022   Compression fracture of thoracic spine, non-traumatic (HCC) 11/26/2021   Tremor of both hands 11/26/2021   B12 deficiency 09/02/2020   Anemia 08/06/2020   TMJ (temporomandibular joint syndrome) 03/02/2018   Pneumonia due to infectious organism 08/23/2017   RA (rheumatoid arthritis) (HCC) 07/13/2017   Polyarthralgia 04/25/2017   ILD (interstitial lung disease) (HCC) 06/18/2016   IPF (idiopathic pulmonary fibrosis) (HCC) 03/23/2016   Bronchiectasis without complication (HCC) 03/23/2016   Allergic rhinitis 03/22/2016   Anal fissure 08/18/2015   Insomnia 08/18/2015   Osteoporosis 02/13/2015   Stress due to illness of family member 09/16/2014   Hiatal hernia 10/13/2012   GERD (gastroesophageal reflux disease)    Allergy     Social History   Tobacco Use   Smoking status: Former    Current packs/day: 0.00    Average packs/day: 0.3 packs/day for 20.0 years (5.0 ttl pk-yrs)    Types: Cigarettes    Start date: 01/16/1961    Quit date: 01/16/1981    Years since quitting: 43.4   Smokeless tobacco: Never  Substance Use Topics   Alcohol use: Yes    Alcohol/week: 7.0 standard drinks of alcohol    Types: 7 Standard drinks or equivalent per week    No Known Allergies  Current Facility-Administered Medications for the 05/24/24 encounter (Office Visit) with Tamea Dedra CROME, MD  Medication   denosumab  (PROLIA ) injection 60 mg   [START ON 08/04/2024] denosumab  (PROLIA ) injection 60 mg   Current Meds  Medication Sig   azaTHIOprine  (IMURAN ) 50 MG tablet TAKE ONE AND ONE-HALF TABLETS (75  MG) EVERY MORNING AND 1 TABLET (50 MG) EVERY EVENING (Patient taking differently: Take 50 mg by mouth daily. TAKE ONE AND ONE-HALF TABLETS (75 MG) EVERY MORNING AND 1 TABLET (50 MG) EVERY EVENING)   azithromycin  (ZITHROMAX ) 250 MG tablet Take 1 tablet 3x a week (Patient taking differently: 250 mg. Take 1 tablet 3x a week- mon. Wed. friday)   denosumab  (PROLIA ) 60 MG/ML SOLN injection Inject 60 mg into the skin every 6 (six) months. Administer in upper arm, thigh, or abdomen   esomeprazole  (NEXIUM ) 20 MG capsule Take 1 capsule (20 mg total) by mouth 2 (two) times daily before a meal.   gabapentin  (NEURONTIN ) 100 MG capsule Take 2 capsules (200 mg total) by mouth at bedtime. (Patient taking differently: Take 100 mg by mouth at bedtime.)   LORazepam  (ATIVAN ) 0.5 MG tablet Take 1 tablet (0.5 mg total) by mouth every 8 (eight) hours as needed (Vertigo).   montelukast  (SINGULAIR ) 10 MG tablet Take 1 tablet (10 mg total) by mouth daily.   Multiple Minerals-Vitamins (CALCIUM & VIT D3 BONE HEALTH PO) Take 1 Dose by mouth daily.   ondansetron  (ZOFRAN ) 4 MG tablet Take 1 tablet (4 mg total) by mouth daily as needed for nausea or vomiting.   predniSONE  (DELTASONE ) 1 MG tablet Take 3 mg by mouth daily with breakfast.   propranolol  (INDERAL ) 10 MG tablet TAKE 1 TABLET TWICE A DAY FOR TREMOR   traZODone  (DESYREL ) 50 MG tablet Take 1 tablet (50 mg total) by mouth at bedtime as needed for sleep. For sleep.   vitamin B-12 (CYANOCOBALAMIN ) 500 MCG tablet Take 500 mcg by mouth daily. (Patient not taking: Reported on 06/05/2024)    Immunization History  Administered Date(s) Administered   Fluad Quad(high Dose 65+) 07/24/2019, 07/04/2022,  07/28/2023   INFLUENZA, HIGH DOSE SEASONAL PF 09/10/2016   Influenza Split 06/04/2012   Influenza,inj,Quad PF,6+ Mos 07/04/2013, 08/18/2015, 07/13/2017, 06/19/2018   Influenza-Unspecified 07/15/2020   Moderna SARS-COV2 Booster Vaccination 05/19/2020, 02/19/2021   Moderna  Sars-Covid-2 Vaccination 10/22/2019, 11/19/2019, 04/02/2022   Pneumococcal Conjugate-13 10/06/2011   Pneumococcal Polysaccharide-23 06/19/2018   Zoster, Live 01/14/2007        Objective:    BP 100/60   Pulse 81   Ht 5' 8 (1.727 m)   Wt 102 lb (46.3 kg)   SpO2 98%   BMI 15.51 kg/m   SpO2: 98 %  GENERAL: Chronically ill-appearing, thin, well-developed woman in no acute distress though she appears uncomfortable due to dizziness.  Ambulatory with assistance from daughter. She is chronically pale. HEAD: Normocephalic, atraumatic. EYES: Pupils equal, round, reactive to light.  No scleral icterus. EARS: Wears hearing aids, she has serous otitis bilaterally, improved from prior. MOUTH: Dentition intact, some teeth in poor repair.  Oral mucosa moist.  No thrush. NECK: Supple. No thyromegaly. Trachea midline. No JVD.  No adenopathy. PULMONARY: Good air entry bilaterally.  Coarse breath sounds at the bases,crackles on the right base (chronic), otherwise no adventitious sounds.   CARDIOVASCULAR: S1 and S2.  Regular rate and rhythm.  No rubs, murmurs or gallops heard.   ABDOMEN: Nondistended, scaphoid.   MUSCULOSKELETAL: Mild rheumatoid arthritis changes from hands, no active synovitis, no clubbing, no edema. NEUROLOGIC: No overt focal deficit, unsteady gait, speech is fluent. SKIN: Intact,warm,dry.   PSYCH: Mood and behavior are normal.     Assessment & Plan:     ICD-10-CM   1. Vertigo  R42 TSH    T4, free    B12 and Folate Panel    2. Bronchiectasis without complication (HCC)  J47.9     3. ILD (interstitial lung disease) (HCC)  J84.9     4. Rheumatoid arthritis involving multiple sites with positive rheumatoid factor (HCC)  M05.79     5. Esophageal dysmotility  K22.4     6. Hair loss  L65.9 TSH    T4, free    7. Chronic maxillary sinusitis  J32.0       Orders Placed This Encounter  Procedures   TSH    Standing Status:   Future    Number of Occurrences:   1     Expiration Date:   05/24/2025   T4, free    Standing Status:   Future    Number of Occurrences:   1    Expiration Date:   05/24/2025   B12 and Folate Panel    Standing Status:   Future    Expiration Date:   05/24/2025   Discussion:    Pulmonary fibrosis and bronchiectasis Chronic respiratory condition with persistent nocturnal cough and production of green thick mucus. Possible sinus involvement contributing to symptoms. - Await evaluation by ENT specialist Dr. Blair for potential antibiotic therapy. - Consider Augmentin  if approved by Dr. Blair.  Vertigo, chronic Chronic vertigo persisting for five months, significantly impacting quality of life. Differential includes potential residual effects from Prolia . No clear link to sinus issues. - Await further evaluation and management options from ENT specialist. - Consider thyroid  function test to rule out hypothyroidism as a contributing factor.  Chronic sinusitis (suspected) Suspected chronic sinusitis with tenderness and pain upon touching the sinus area. Awaiting further evaluation by ENT specialist. - Await ENT evaluation for potential sinusitis diagnosis and management. - Consider small scope examination by ENT to assess sinus  condition.  Nausea and constipation secondary to antiemetic use Nausea managed effectively with Zofran , but causes constipation. Constipation managed with occasional use of Fleet enemas. - Use Zofran  as needed for severe nausea. - Manage constipation with Fleet enemas as needed.  Alopecia and nail changes under evaluation Experiencing hair loss and nail changes, possibly related to underlying conditions or medications. Thyroid  dysfunction considered as a potential cause. - Order thyroid  function test to evaluate for hypothyroidism.      Advised if symptoms do not improve or worsen, to please contact office for sooner follow up or seek emergency care.    I spent 40 minutes of dedicated to the care of  this patient on the date of this encounter to include pre-visit review of records, face-to-face time with the patient discussing conditions above, post visit ordering of testing, clinical documentation with the electronic health record, making appropriate referrals as documented, and communicating necessary findings to members of the patients care team.     C. Leita Sanders, MD Advanced Bronchoscopy P[facility]    *This note was generated using voice recognition software/Dragon and/or AI transcription program.  Despite best efforts to proofread, errors can occur which can change the meaning. Any transcriptional errors that result from this process are unintentional and may not be fully corrected at the time of dictation.

## 2024-05-24 NOTE — Patient Instructions (Signed)
 VISIT SUMMARY:  During today's visit, we discussed your ongoing issues with vertigo, respiratory symptoms, sinus tenderness, nausea, constipation, and changes in your hair and nails. We reviewed your current medications and considered potential underlying causes for your symptoms. We also planned for further evaluations and tests to better understand and manage your conditions.  YOUR PLAN:  -PULMONARY FIBROSIS AND BRONCHIECTASIS: Pulmonary fibrosis and bronchiectasis are chronic lung conditions that cause scarring and widening of the airways, leading to persistent cough and mucus production. We will wait for your evaluation by the ENT specialist, Dr. Blair, to determine if antibiotic therapy is needed. If approved by Dr. Blair, we may consider starting you on Augmentin .  -CHRONIC VERTIGO: Chronic vertigo is a long-lasting sensation of dizziness that can significantly affect your daily activities. We will wait for further evaluation and management options from the ENT specialist. Additionally, we will consider a thyroid  function test to rule out hypothyroidism as a contributing factor.  -SUSPECTED CHRONIC SINUSITIS: Chronic sinusitis is a long-term inflammation of the sinuses that can cause pain and tenderness. We will wait for the ENT specialist to evaluate you for a potential diagnosis and management plan. A small scope examination by the ENT may be necessary to assess your sinus condition.  -NAUSEA AND CONSTIPATION SECONDARY TO ANTIEMETIC USE: The use of Zofran  for nausea has been effective but has caused constipation. You should continue to use Zofran  as needed for severe nausea and manage constipation with Fleet enemas as needed.  -ALOPECIA AND NAIL CHANGES: Alopecia refers to hair loss, and changes in your nails could be related to underlying health conditions or medications. We will order a thyroid  function test to evaluate for hypothyroidism as a potential cause.  INSTRUCTIONS:  Please  follow up with the ENT specialist, Dr. Blair, for further evaluation of your respiratory symptoms, vertigo, and suspected chronic sinusitis. Additionally, get a thyroid  function test done to check for hypothyroidism. Continue using Zofran  as needed for nausea and manage constipation with Fleet enemas as necessary.

## 2024-05-29 ENCOUNTER — Encounter: Payer: Self-pay | Admitting: Pulmonary Disease

## 2024-05-29 MED ORDER — GUAIFENESIN-CODEINE 100-10 MG/5ML PO SOLN: 5.0000 mL | Freq: Three times a day (TID) | ORAL | 0 refills | Status: DC | PRN

## 2024-05-29 NOTE — Telephone Encounter (Signed)
 Sent prescription for cough suppressant to her pharmacy.  She can hold off on Augmentin  for a few days to see if it makes any difference in her symptoms.

## 2024-06-01 NOTE — Telephone Encounter (Signed)
 Thank you for the heads up. I am glad Crystal Gregory is able to care for you. Please let us  know of any other changes.

## 2024-06-05 ENCOUNTER — Emergency Department

## 2024-06-05 ENCOUNTER — Encounter: Payer: Self-pay | Admitting: Internal Medicine

## 2024-06-05 ENCOUNTER — Other Ambulatory Visit: Payer: Self-pay

## 2024-06-05 ENCOUNTER — Inpatient Hospital Stay
Admission: EM | Admit: 2024-06-05 | Discharge: 2024-06-12 | DRG: 193 | Disposition: A | Discharge status: Skilled Nursing Facility | Attending: Student | Admitting: Student

## 2024-06-05 DIAGNOSIS — R42 Dizziness and giddiness: Secondary | ICD-10-CM | POA: Diagnosis present

## 2024-06-05 DIAGNOSIS — J441 Chronic obstructive pulmonary disease with (acute) exacerbation: Secondary | ICD-10-CM | POA: Diagnosis present

## 2024-06-05 DIAGNOSIS — Z7901 Long term (current) use of anticoagulants: Secondary | ICD-10-CM

## 2024-06-05 DIAGNOSIS — M79601 Pain in right arm: Secondary | ICD-10-CM | POA: Diagnosis not present

## 2024-06-05 DIAGNOSIS — J189 Pneumonia, unspecified organism: Principal | ICD-10-CM | POA: Diagnosis present

## 2024-06-05 DIAGNOSIS — Z66 Do not resuscitate: Secondary | ICD-10-CM | POA: Diagnosis not present

## 2024-06-05 DIAGNOSIS — K5900 Constipation, unspecified: Secondary | ICD-10-CM | POA: Diagnosis not present

## 2024-06-05 DIAGNOSIS — E871 Hypo-osmolality and hyponatremia: Secondary | ICD-10-CM | POA: Diagnosis not present

## 2024-06-05 DIAGNOSIS — D84821 Immunodeficiency due to drugs: Secondary | ICD-10-CM | POA: Diagnosis present

## 2024-06-05 DIAGNOSIS — M069 Rheumatoid arthritis, unspecified: Secondary | ICD-10-CM | POA: Diagnosis present

## 2024-06-05 DIAGNOSIS — Z87891 Personal history of nicotine dependence: Secondary | ICD-10-CM

## 2024-06-05 DIAGNOSIS — J84112 Idiopathic pulmonary fibrosis: Secondary | ICD-10-CM | POA: Diagnosis present

## 2024-06-05 DIAGNOSIS — M6281 Muscle weakness (generalized): Secondary | ICD-10-CM | POA: Diagnosis not present

## 2024-06-05 DIAGNOSIS — J9622 Acute and chronic respiratory failure with hypercapnia: Secondary | ICD-10-CM | POA: Diagnosis not present

## 2024-06-05 DIAGNOSIS — E43 Unspecified severe protein-calorie malnutrition: Secondary | ICD-10-CM | POA: Diagnosis not present

## 2024-06-05 DIAGNOSIS — J168 Pneumonia due to other specified infectious organisms: Secondary | ICD-10-CM | POA: Diagnosis not present

## 2024-06-05 DIAGNOSIS — K2289 Other specified disease of esophagus: Secondary | ICD-10-CM | POA: Diagnosis not present

## 2024-06-05 DIAGNOSIS — Z0389 Encounter for observation for other suspected diseases and conditions ruled out: Secondary | ICD-10-CM | POA: Diagnosis not present

## 2024-06-05 DIAGNOSIS — R053 Chronic cough: Secondary | ICD-10-CM | POA: Diagnosis present

## 2024-06-05 DIAGNOSIS — J440 Chronic obstructive pulmonary disease with acute lower respiratory infection: Secondary | ICD-10-CM | POA: Diagnosis present

## 2024-06-05 DIAGNOSIS — J470 Bronchiectasis with acute lower respiratory infection: Secondary | ICD-10-CM | POA: Diagnosis not present

## 2024-06-05 DIAGNOSIS — Z820 Family history of epilepsy and other diseases of the nervous system: Secondary | ICD-10-CM

## 2024-06-05 DIAGNOSIS — J9601 Acute respiratory failure with hypoxia: Secondary | ICD-10-CM | POA: Diagnosis not present

## 2024-06-05 DIAGNOSIS — R918 Other nonspecific abnormal finding of lung field: Secondary | ICD-10-CM | POA: Diagnosis not present

## 2024-06-05 DIAGNOSIS — Z515 Encounter for palliative care: Secondary | ICD-10-CM

## 2024-06-05 DIAGNOSIS — B9789 Other viral agents as the cause of diseases classified elsewhere: Secondary | ICD-10-CM | POA: Diagnosis not present

## 2024-06-05 DIAGNOSIS — I82811 Embolism and thrombosis of superficial veins of right lower extremities: Secondary | ICD-10-CM | POA: Diagnosis not present

## 2024-06-05 DIAGNOSIS — J9621 Acute and chronic respiratory failure with hypoxia: Secondary | ICD-10-CM | POA: Diagnosis not present

## 2024-06-05 DIAGNOSIS — R059 Cough, unspecified: Secondary | ICD-10-CM | POA: Diagnosis not present

## 2024-06-05 DIAGNOSIS — I2510 Atherosclerotic heart disease of native coronary artery without angina pectoris: Secondary | ICD-10-CM | POA: Diagnosis present

## 2024-06-05 DIAGNOSIS — I878 Other specified disorders of veins: Secondary | ICD-10-CM | POA: Diagnosis not present

## 2024-06-05 DIAGNOSIS — J849 Interstitial pulmonary disease, unspecified: Secondary | ICD-10-CM | POA: Diagnosis not present

## 2024-06-05 DIAGNOSIS — I4891 Unspecified atrial fibrillation: Secondary | ICD-10-CM | POA: Diagnosis not present

## 2024-06-05 DIAGNOSIS — J90 Pleural effusion, not elsewhere classified: Secondary | ICD-10-CM | POA: Diagnosis not present

## 2024-06-05 DIAGNOSIS — R000 Tachycardia, unspecified: Secondary | ICD-10-CM | POA: Diagnosis not present

## 2024-06-05 DIAGNOSIS — R058 Other specified cough: Secondary | ICD-10-CM | POA: Diagnosis not present

## 2024-06-05 DIAGNOSIS — G4700 Insomnia, unspecified: Secondary | ICD-10-CM | POA: Diagnosis not present

## 2024-06-05 DIAGNOSIS — R54 Age-related physical debility: Secondary | ICD-10-CM | POA: Diagnosis present

## 2024-06-05 DIAGNOSIS — J479 Bronchiectasis, uncomplicated: Secondary | ICD-10-CM | POA: Diagnosis not present

## 2024-06-05 DIAGNOSIS — J449 Chronic obstructive pulmonary disease, unspecified: Secondary | ICD-10-CM | POA: Diagnosis not present

## 2024-06-05 DIAGNOSIS — Z79899 Other long term (current) drug therapy: Secondary | ICD-10-CM

## 2024-06-05 DIAGNOSIS — J9602 Acute respiratory failure with hypercapnia: Secondary | ICD-10-CM | POA: Diagnosis not present

## 2024-06-05 DIAGNOSIS — J984 Other disorders of lung: Secondary | ICD-10-CM | POA: Diagnosis not present

## 2024-06-05 DIAGNOSIS — Z7960 Long term (current) use of unspecified immunomodulators and immunosuppressants: Secondary | ICD-10-CM

## 2024-06-05 DIAGNOSIS — R110 Nausea: Secondary | ICD-10-CM | POA: Diagnosis not present

## 2024-06-05 DIAGNOSIS — K219 Gastro-esophageal reflux disease without esophagitis: Secondary | ICD-10-CM | POA: Diagnosis present

## 2024-06-05 DIAGNOSIS — Z1152 Encounter for screening for COVID-19: Secondary | ICD-10-CM

## 2024-06-05 DIAGNOSIS — I340 Nonrheumatic mitral (valve) insufficiency: Secondary | ICD-10-CM | POA: Diagnosis not present

## 2024-06-05 DIAGNOSIS — R7989 Other specified abnormal findings of blood chemistry: Secondary | ICD-10-CM | POA: Diagnosis not present

## 2024-06-05 DIAGNOSIS — M810 Age-related osteoporosis without current pathological fracture: Secondary | ICD-10-CM | POA: Diagnosis not present

## 2024-06-05 DIAGNOSIS — E8770 Fluid overload, unspecified: Secondary | ICD-10-CM | POA: Diagnosis not present

## 2024-06-05 DIAGNOSIS — R2681 Unsteadiness on feet: Secondary | ICD-10-CM | POA: Diagnosis not present

## 2024-06-05 DIAGNOSIS — Z741 Need for assistance with personal care: Secondary | ICD-10-CM | POA: Diagnosis not present

## 2024-06-05 DIAGNOSIS — Z681 Body mass index (BMI) 19 or less, adult: Secondary | ICD-10-CM | POA: Diagnosis not present

## 2024-06-05 DIAGNOSIS — R1111 Vomiting without nausea: Secondary | ICD-10-CM | POA: Diagnosis not present

## 2024-06-05 DIAGNOSIS — Z7952 Long term (current) use of systemic steroids: Secondary | ICD-10-CM

## 2024-06-05 DIAGNOSIS — R0609 Other forms of dyspnea: Secondary | ICD-10-CM | POA: Diagnosis present

## 2024-06-05 DIAGNOSIS — R0602 Shortness of breath: Secondary | ICD-10-CM | POA: Diagnosis not present

## 2024-06-05 DIAGNOSIS — R2689 Other abnormalities of gait and mobility: Secondary | ICD-10-CM | POA: Diagnosis not present

## 2024-06-05 LAB — CBC WITH DIFFERENTIAL/PLATELET
Abs Immature Granulocytes: 0.1 K/uL — ABNORMAL HIGH (ref 0.00–0.07)
Basophils Absolute: 0 K/uL (ref 0.0–0.1)
Basophils Relative: 0 %
Eosinophils Absolute: 0 K/uL (ref 0.0–0.5)
Eosinophils Relative: 0 %
HCT: 28.1 % — ABNORMAL LOW (ref 36.0–46.0)
Hemoglobin: 9.5 g/dL — ABNORMAL LOW (ref 12.0–15.0)
Immature Granulocytes: 1 %
Lymphocytes Relative: 6 %
Lymphs Abs: 0.6 K/uL — ABNORMAL LOW (ref 0.7–4.0)
MCH: 34.5 pg — ABNORMAL HIGH (ref 26.0–34.0)
MCHC: 33.8 g/dL (ref 30.0–36.0)
MCV: 102.2 fL — ABNORMAL HIGH (ref 80.0–100.0)
Monocytes Absolute: 0.4 K/uL (ref 0.1–1.0)
Monocytes Relative: 4 %
Neutro Abs: 9.4 K/uL — ABNORMAL HIGH (ref 1.7–7.7)
Neutrophils Relative %: 89 %
Platelets: 356 K/uL (ref 150–400)
RBC: 2.75 MIL/uL — ABNORMAL LOW (ref 3.87–5.11)
RDW: 14.9 % (ref 11.5–15.5)
Smear Review: NORMAL
WBC: 10.6 K/uL — ABNORMAL HIGH (ref 4.0–10.5)
nRBC: 0 % (ref 0.0–0.2)

## 2024-06-05 LAB — COMPREHENSIVE METABOLIC PANEL WITH GFR
ALT: 17 U/L (ref 0–44)
AST: 23 U/L (ref 15–41)
Albumin: 2.4 g/dL — ABNORMAL LOW (ref 3.5–5.0)
Alkaline Phosphatase: 110 U/L (ref 38–126)
Anion gap: 11 (ref 5–15)
BUN: 14 mg/dL (ref 8–23)
CO2: 23 mmol/L (ref 22–32)
Calcium: 7.8 mg/dL — ABNORMAL LOW (ref 8.9–10.3)
Chloride: 93 mmol/L — ABNORMAL LOW (ref 98–111)
Creatinine, Ser: 0.44 mg/dL (ref 0.44–1.00)
GFR, Estimated: >60 mL/min (ref >60)
Glucose, Bld: 109 mg/dL — ABNORMAL HIGH (ref 70–99)
Potassium: 4.1 mmol/L (ref 3.5–5.1)
Sodium: 127 mmol/L — ABNORMAL LOW (ref 135–145)
Total Bilirubin: 0.9 mg/dL (ref 0.0–1.2)
Total Protein: 6.4 g/dL — ABNORMAL LOW (ref 6.5–8.1)

## 2024-06-05 LAB — BLOOD GAS, VENOUS
Acid-Base Excess: 3.1 mmol/L — ABNORMAL HIGH (ref 0.0–2.0)
Bicarbonate: 27 mmol/L (ref 20.0–28.0)
O2 Saturation: 91 %
Patient temperature: 37
pCO2, Ven: 38 mmHg — ABNORMAL LOW (ref 44–60)
pH, Ven: 7.46 — ABNORMAL HIGH (ref 7.25–7.43)
pO2, Ven: 61 mmHg — ABNORMAL HIGH (ref 32–45)

## 2024-06-05 LAB — PROCALCITONIN: Procalcitonin: 0.16 ng/mL

## 2024-06-05 LAB — RESP PANEL BY RT-PCR (RSV, FLU A&B, COVID)  RVPGX2
Influenza A by PCR: NEGATIVE
Influenza B by PCR: NEGATIVE
Resp Syncytial Virus by PCR: NEGATIVE
SARS Coronavirus 2 by RT PCR: NEGATIVE

## 2024-06-05 LAB — LACTIC ACID, PLASMA: Lactic Acid, Venous: 1.2 mmol/L (ref 0.5–1.9)

## 2024-06-05 LAB — MAGNESIUM: Magnesium: 1.8 mg/dL (ref 1.7–2.4)

## 2024-06-05 LAB — TROPONIN I (HIGH SENSITIVITY): Troponin I (High Sensitivity): 15 ng/L (ref <18)

## 2024-06-05 LAB — D-DIMER, QUANTITATIVE: D-Dimer, Quant: 5.43 ug{FEU}/mL — ABNORMAL HIGH (ref 0.00–0.50)

## 2024-06-05 LAB — BRAIN NATRIURETIC PEPTIDE: B Natriuretic Peptide: 383.8 pg/mL — ABNORMAL HIGH (ref 0.0–100.0)

## 2024-06-05 MED ORDER — SODIUM CHLORIDE 0.9 % IV SOLN
100.0000 mg | Freq: Two times a day (BID) | INTRAVENOUS | Status: DC
  Administered 2024-06-05 – 2024-06-06 (×3): 100 mg via INTRAVENOUS
  Filled 2024-06-05 (×5): qty 100

## 2024-06-05 MED ORDER — LACTATED RINGERS IV BOLUS
1000.0000 mL | Freq: Once | INTRAVENOUS | Status: AC
  Administered 2024-06-05: 1000 mL via INTRAVENOUS

## 2024-06-05 MED ORDER — SODIUM CHLORIDE 0.9 % IV SOLN
100.0000 mg | Freq: Once | INTRAVENOUS | Status: AC
  Administered 2024-06-05: 100 mg via INTRAVENOUS
  Filled 2024-06-05: qty 100

## 2024-06-05 MED ORDER — ONDANSETRON HCL 4 MG/2ML IJ SOLN
4.0000 mg | Freq: Four times a day (QID) | INTRAMUSCULAR | Status: AC | PRN
  Administered 2024-06-06 – 2024-06-10 (×4): 4 mg via INTRAVENOUS
  Filled 2024-06-05 (×5): qty 2

## 2024-06-05 MED ORDER — SODIUM CHLORIDE 0.9 % IV SOLN
2.0000 g | INTRAVENOUS | Status: DC
  Administered 2024-06-06: 2 g via INTRAVENOUS
  Filled 2024-06-05 (×2): qty 20

## 2024-06-05 MED ORDER — DILTIAZEM HCL-DEXTROSE 125-5 MG/125ML-% IV SOLN (PREMIX)
5.0000 mg/h | INTRAVENOUS | Status: DC
  Administered 2024-06-05: 5 mg/h via INTRAVENOUS
  Administered 2024-06-06: 10 mg/h via INTRAVENOUS
  Administered 2024-06-06 – 2024-06-07 (×2): 5 mg/h via INTRAVENOUS
  Filled 2024-06-05 (×4): qty 125

## 2024-06-05 MED ORDER — ACETAMINOPHEN 325 MG PO TABS
650.0000 mg | ORAL_TABLET | Freq: Four times a day (QID) | ORAL | Status: AC | PRN
  Administered 2024-06-05 – 2024-06-08 (×6): 650 mg via ORAL
  Filled 2024-06-05 (×6): qty 2

## 2024-06-05 MED ORDER — GABAPENTIN 100 MG PO CAPS
100.0000 mg | ORAL_CAPSULE | Freq: Every day | ORAL | Status: DC
  Administered 2024-06-05 – 2024-06-11 (×6): 100 mg via ORAL
  Filled 2024-06-05 (×7): qty 1

## 2024-06-05 MED ORDER — APIXABAN 2.5 MG PO TABS
2.5000 mg | ORAL_TABLET | Freq: Two times a day (BID) | ORAL | Status: DC
  Administered 2024-06-05 – 2024-06-12 (×13): 2.5 mg via ORAL
  Filled 2024-06-05 (×15): qty 1

## 2024-06-05 MED ORDER — GABAPENTIN 100 MG PO CAPS: 200.0000 mg | ORAL_CAPSULE | Freq: Every day | ORAL | Status: DC

## 2024-06-05 MED ORDER — DILTIAZEM LOAD VIA INFUSION
15.0000 mg | Freq: Once | INTRAVENOUS | Status: DC
  Filled 2024-06-05: qty 15

## 2024-06-05 MED ORDER — TRAZODONE HCL 50 MG PO TABS
50.0000 mg | ORAL_TABLET | Freq: Every evening | ORAL | Status: DC | PRN
  Administered 2024-06-08 – 2024-06-10 (×3): 50 mg via ORAL
  Filled 2024-06-05 (×3): qty 1

## 2024-06-05 MED ORDER — ACETAMINOPHEN 650 MG RE SUPP
650.0000 mg | Freq: Four times a day (QID) | RECTAL | Status: AC | PRN
Start: 2024-06-05 — End: 2024-06-10
  Filled 2024-06-05: qty 2

## 2024-06-05 MED ORDER — ACETAMINOPHEN 500 MG PO TABS
1000.0000 mg | ORAL_TABLET | Freq: Once | ORAL | Status: AC
  Administered 2024-06-05: 1000 mg via ORAL
  Filled 2024-06-05: qty 2

## 2024-06-05 MED ORDER — LORAZEPAM 0.5 MG PO TABS
0.5000 mg | ORAL_TABLET | Freq: Three times a day (TID) | ORAL | Status: DC | PRN
  Administered 2024-06-08 – 2024-06-10 (×6): 0.5 mg via ORAL
  Filled 2024-06-05 (×6): qty 1

## 2024-06-05 MED ORDER — DILTIAZEM HCL 25 MG/5ML IV SOLN
10.0000 mg | Freq: Once | INTRAVENOUS | Status: AC
  Administered 2024-06-05: 10 mg via INTRAVENOUS
  Filled 2024-06-05: qty 5

## 2024-06-05 MED ORDER — HYDROCOD POLI-CHLORPHE POLI ER 10-8 MG/5ML PO SUER
5.0000 mL | Freq: Every evening | ORAL | Status: AC | PRN
  Administered 2024-06-06: 5 mL via ORAL
  Filled 2024-06-05: qty 5

## 2024-06-05 MED ORDER — SODIUM CHLORIDE 0.9 % IV SOLN
1.0000 g | Freq: Once | INTRAVENOUS | Status: AC
  Administered 2024-06-05: 1 g via INTRAVENOUS
  Filled 2024-06-05: qty 10

## 2024-06-05 MED ORDER — HEPARIN SODIUM (PORCINE) 5000 UNIT/ML IJ SOLN
5000.0000 [IU] | Freq: Three times a day (TID) | INTRAMUSCULAR | Status: DC
Start: 2024-06-05 — End: 2024-06-05

## 2024-06-05 MED ORDER — ONDANSETRON HCL 4 MG PO TABS: 4.0000 mg | ORAL_TABLET | Freq: Four times a day (QID) | ORAL | Status: AC | PRN

## 2024-06-05 MED ORDER — IOHEXOL 350 MG/ML SOLN
75.0000 mL | Freq: Once | INTRAVENOUS | Status: AC | PRN
  Administered 2024-06-05: 75 mL via INTRAVENOUS

## 2024-06-05 MED ORDER — SODIUM CHLORIDE 0.9 % IV BOLUS
500.0000 mL | Freq: Once | INTRAVENOUS | Status: AC
  Administered 2024-06-05: 500 mL via INTRAVENOUS

## 2024-06-05 MED ORDER — SODIUM CHLORIDE 0.9 % IV BOLUS: 1000.0000 mL | Freq: Once | INTRAVENOUS | Status: DC

## 2024-06-05 MED ORDER — GUAIFENESIN 100 MG/5ML PO LIQD: 5.0000 mL | Freq: Four times a day (QID) | ORAL | Status: DC | PRN

## 2024-06-05 NOTE — Telephone Encounter (Signed)
 The best thing would be to have the primary care do this.  We usually do not do admissions to skilled nursing facilities.  Contact primary provider's office today.

## 2024-06-05 NOTE — Hospital Course (Addendum)
 [name] is a 89 year old female with history of interstitial lung disease, vertigo, history of nontraumatic compression fracture, rheumatoid arthritis, polyarthralgia, bronchiectasis, idiopathic pulmonary fibrosis, insomnia, GERD, who presents emergency department for chief concerns of dizziness, productive cough for about 2 weeks.  Vitals in the ED showed t of 98.5, rr 18, hr 98, blood pressure 114/68, SpO2 96% on room air.  Serum sodium is 127, potassium 4.1, chloride 93, bicarb 23, BUN 14, serum creatinine 0.44, EGFR greater than 60, nonfasting blood glucose 109, WBC 10.6, hemoglobin 9.5, platelets of 356.  BNP is 383.8, hs troponin is 15, Pro-Cal is 0.16, lactic acid 1.2, D-dimer 5.43, COVID/influenza A/influenza B/RSV PCR were negative.  ED treatment: Acetaminophen  1000 mg p.o. one-time dose, diltiazem  bolus and gtt., ceftriaxone  1 g IV one-time dose, doxycycline  100 mg IV, sodium chloride  500 mL bolus.

## 2024-06-05 NOTE — ED Provider Notes (Signed)
 [facility] Provider Note    Event Date/Time   First MD Initiated Contact with Patient 06/05/24 1252     (approximate)   History   Dizziness   HPI  Crystal Gregory is a 89 y.o. female with a past medical history of past small A-fib not on any anticoagulation does not follow with a cardiologist,, pulmonary fibrosis followed by pulmonology, CHF and CAD who presents to the emergency department with 2 weeks of progressively worsening shortness of breath, palpitations in the setting of 1 month of increasing lightheadedness.  Patient reports that she has had vertigo for over a month which has been worked up by her primary care physician.  She did have an MRI completed 05/08/2024 which was unremarkable.  She has not had any new falls.  She continues to have nausea and she has been losing weight  Patient was noted to be in atrial fibrillation at her last pulmonary visit on 05/24/2024 per her report.  It is unclear the outcome (note not finished) however patient states that she was told to follow-up with primary care physician.  Patient denies any chest pain or abdominal pain.   Physical Exam   Triage Vital Signs: ED Triage Vitals  Encounter Vitals Group     BP --      Girls Systolic BP Percentile --      Girls Diastolic BP Percentile --      Boys Systolic BP Percentile --      Boys Diastolic BP Percentile --      Pulse Rate 06/05/24 1256 (!) 104     Resp 06/05/24 1256 (!) 26     Temp --      Temp src --      SpO2 06/05/24 1256 96 %     Weight 06/05/24 1254 108 lb (49 kg)     Height 06/05/24 1254 5' 8 (1.727 m)     Head Circumference --      Peak Flow --      Pain Score 06/05/24 1254 0     Pain Loc --      Pain Education --      Exclude from Growth Chart --     Most recent vital signs: Vitals:   06/05/24 1440 06/05/24 1452  BP:    Pulse: (!) 107 96  Resp:    Temp:    SpO2:      Nursing Triage Note reviewed. Vital signs reviewed and patients oxygen   saturation is normoxic General: Patient is very thin, well developed, awake and alert, resting comfortably in no acute distress Head: Normocephalic and atraumatic Eyes: Normal inspection, extraocular muscles intact, no conjunctival pallor Ear, nose, throat: Normal external exam Neck: Normal range of motion Respiratory: Patient is in mild respiratory distress, lungs wheezes thought Cardiovascular: Patient is tachycardic, irregularly irregular GI: Abd SNT Back: Normal inspection of the back with good strength and range of motion throughout all ext Extremities: pulses intact with good cap refills, no LE pitting edema or calf tenderness Neuro: The patient is alert and oriented to person, place, and time, appropriately conversive, with 5/5 bilat UE/LE strength, no gross motor or sensory defects noted. Coordination appears to be adequate.  However patient does endorse ongoing lightheadedness Skin: Warm, dry, and intact Psych: normal mood and affect, no SI or HI  ED Results / Procedures / Treatments   Labs (all labs ordered are listed, but only abnormal results are displayed) Labs Reviewed  CBC WITH DIFFERENTIAL/PLATELET - Abnormal;  Notable for the following components:      Result Value   WBC 10.6 (*)    RBC 2.75 (*)    Hemoglobin 9.5 (*)    HCT 28.1 (*)    MCV 102.2 (*)    MCH 34.5 (*)    Neutro Abs 9.4 (*)    Lymphs Abs 0.6 (*)    Abs Immature Granulocytes 0.10 (*)    All other components within normal limits  COMPREHENSIVE METABOLIC PANEL WITH GFR - Abnormal; Notable for the following components:   Sodium 127 (*)    Chloride 93 (*)    Glucose, Bld 109 (*)    Calcium 7.8 (*)    Total Protein 6.4 (*)    Albumin 2.4 (*)    All other components within normal limits  BRAIN NATRIURETIC PEPTIDE - Abnormal; Notable for the following components:   B Natriuretic Peptide 383.8 (*)    All other components within normal limits  BLOOD GAS, VENOUS - Abnormal; Notable for the following  components:   pH, Ven 7.46 (*)    pCO2, Ven 38 (*)    pO2, Ven 61 (*)    Acid-Base Excess 3.1 (*)    All other components within normal limits  D-DIMER, QUANTITATIVE - Abnormal; Notable for the following components:   D-Dimer, Quant 5.43 (*)    All other components within normal limits  RESP PANEL BY RT-PCR (RSV, FLU A&B, COVID)  [id number]  CULTURE, BLOOD (ROUTINE X 2)  CULTURE, BLOOD (ROUTINE X 2)  MAGNESIUM   PROCALCITONIN  LACTIC ACID, PLASMA  LACTIC ACID, PLASMA  TROPONIN I (HIGH SENSITIVITY)  TROPONIN I (HIGH SENSITIVITY)     EKG EKG and rhythm strip are interpreted by myself:   EKG: afib  at heart rate of 129, normal QRS duration, QTc 413, nonspecific ST segments and T waves no ectopy EKG not consistent with Acute STEMI Rhythm strip: afib with RVR in lead II   RADIOLOGY XR chest: Chest x-ray did return with increasing opacities over her baseline.  Will start initiating treatment for pneumonia    PROCEDURES:  Critical Care performed: Yes, see critical care procedure note(s)  .Critical Care  Performed by: Nicholaus Rolland BRAVO, MD Authorized by: Nicholaus Rolland BRAVO, MD   Critical care provider statement:    Critical care time (minutes):  30   Critical care was necessary to treat or prevent imminent or life-threatening deterioration of the following conditions:  Cardiac failure   Critical care was time spent personally by me on the following activities:  Development of treatment plan with patient or surrogate, discussions with consultants, evaluation of patient's response to treatment, examination of patient, ordering and review of laboratory studies, ordering and review of radiographic studies, ordering and performing treatments and interventions, pulse oximetry, re-evaluation of patient's condition and review of old charts Comments:     Requiring multiple boluses of diltiazem  and eventually ending up in a diltiazem  drip    MEDICATIONS ORDERED IN ED: Medications   diltiazem  (CARDIZEM ) 1 mg/mL load via infusion 15 mg (15 mg Intravenous Not Given 06/05/24 1453)    And  diltiazem  (CARDIZEM ) 125 mg in dextrose  5% 125 mL (1 mg/mL) infusion (5 mg/hr Intravenous New Bag/Given 06/05/24 1444)  cefTRIAXone  (ROCEPHIN ) 1 g in sodium chloride  0.9 % 100 mL IVPB (has no administration in time range)  doxycycline  (VIBRAMYCIN ) 100 mg in sodium chloride  0.9 % 250 mL IVPB (has no administration in time range)  sodium chloride  0.9 % bolus 500 mL (500 mLs  Intravenous New Bag/Given 06/05/24 1305)  diltiazem  (CARDIZEM ) injection 10 mg (10 mg Intravenous Given 06/05/24 1332)     IMPRESSION / MDM / ASSESSMENT AND PLAN / ED COURSE                                Differential diagnosis includes, but is not limited to, arrhythmia, atypical ACS, CHF, PE, pneumonia   ED course: Patient arrives acutely in A-fib with RVR however patient endorses a prior history of A-fib.  Blood pressure was not hypotensive.  Her sodium is mildly low however she did receive repletion.  Patient does have an elevated BNP but troponin is not elevated and reassured that patient denies any chest pain.  Chest x-ray did return with the possibility of worsening pneumonia.  COVID test is unremarkable.  I did make the decision to treat for community-acquired pneumonia.  D-dimer is profoundly elevated and given this I have sent the patient for CT angio chest to evaluate for pulmonary embolism and have ordered Dopplers.  Anticipate patient will require admission today.  Patient signed out to oncoming physician at 3:15 PM   Clinical Course as of 06/05/24 1519  Tue Jun 05, 2024  1414 D-Dimer, Quant(!): 5.43 Will order CT scan [HD]  1417 Patient remains in A-fib despite the diltiazem .  Will start her on a diltiazem  drip [HD]  1448 B Natriuretic Peptide(!): 383.8 Elevated [HD]  1448 Sodium(!): 127 Receiving a small amount of fluids [HD]    Clinical Course User Index [HD] Nicholaus Rolland BRAVO, MD     FINAL CLINICAL  IMPRESSION(S) / ED DIAGNOSES   Final diagnoses:  Atrial fibrillation with rapid ventricular response (HCC)  Pneumonia due to infectious organism, unspecified laterality, unspecified part of lung  Vertigo     Rx / DC Orders   ED Discharge Orders     None        Note:  This document was prepared using Dragon voice recognition software and may include unintentional dictation errors.   Nicholaus Rolland BRAVO, MD 06/05/24 [phone]

## 2024-06-05 NOTE — Assessment & Plan Note (Signed)
 Status post sodium chloride  500 mL liter bolus per EDP On admission of ordered LR 1 L bolus one-time dose ordered

## 2024-06-05 NOTE — ED Notes (Signed)
 This NT assisted pt onto and off of bed pan. Pt was adjusted to be more comfortable in bed.

## 2024-06-05 NOTE — Assessment & Plan Note (Addendum)
 Diltiazem  10 mg IV one-time dose ordered Continue diltiazem  gtt. CHA2DS2VASc = 3 (age > 43, gender: Female) Discussed risks/benefits of anticoagulation with atrial fibrillation, including GI bleed, intracranial bleeding especially with trauma, increased bruising.  Patient advised to let PCP/provider know should she develop the symptoms. Patient endorses understanding of these risks and states she would like to be initiated on anticoagulation Eliquis  2.5 mg p.o. twice daily initiated Continue outpatient follow-up with cardiologist, Dr. Gollan

## 2024-06-05 NOTE — Assessment & Plan Note (Signed)
 Gabapentin  100 mg daily resumed Trazodone  50 mg nightly as needed for sleep ordered

## 2024-06-05 NOTE — ED Provider Notes (Signed)
 Patient received in signout from Dr. Nicholaus.  New onset A-fib, A-fib with RVR on diltiazem  drip.  Rates are improving and she remains hemodynamically stable.  CTA chest without PE, but does have signs of bibasilar pneumonia.  Lower extremity Dopplers are pending.   I updated patient and daughter at the bedside of these results.  We discussed antibiotics, medications for her heart and medical admission.  Answered their questions.  I then consult with hospitalist who agrees to admit.  [id number] Lead EKG Interpretation  Performed by: Claudene Rover, MD Authorized by: Claudene Rover, MD     Interpretation: abnormal     ECG rate:  102   ECG rate assessment: tachycardic     Rhythm: atrial fibrillation     Ectopy: none     Conduction: normal   .Critical Care  Performed by: Claudene Rover, MD Authorized by: Claudene Rover, MD   Critical care provider statement:    Critical care time (minutes):  30   Critical care time was exclusive of:  Separately billable procedures and treating other patients   Critical care was necessary to treat or prevent imminent or life-threatening deterioration of the following conditions:  Sepsis   Critical care was time spent personally by me on the following activities:  Development of treatment plan with patient or surrogate, discussions with consultants, evaluation of patient's response to treatment, examination of patient, ordering and review of laboratory studies, ordering and review of radiographic studies, ordering and performing treatments and interventions, pulse oximetry, re-evaluation of patient's condition and review of old charts     Claudene Rover, MD 06/05/24 1746

## 2024-06-05 NOTE — Assessment & Plan Note (Addendum)
 Patient states the meclizine  no longer works Resume to lorazepam  0.5 mg every 8 hours as needed for vertigo CT head imaging was considered however I noted that on 05/08/2024, patient had MRI of the brain with and without contrast outpatient for dizziness, nausea, vertigo symptoms for at least 6 weeks MRI brain w wo contrast: read as no acute intracranial abnormality.  Age-related atrophy and moderate periventricular and subcortical white matter disease.  Complete opacification of left maxillary sinus.

## 2024-06-05 NOTE — Assessment & Plan Note (Addendum)
 Bibasilar pneumonia Community-acquired pneumonia Continue with doxycycline  100 mg IV twice daily, ceftriaxone  2 g IV daily to complete a 5-day course

## 2024-06-05 NOTE — Assessment & Plan Note (Signed)
Continue outpatient follow-up with pulmonologist

## 2024-06-05 NOTE — H&P (Signed)
 History and Physical   Crystal Gregory FMW:[id number] DOB: 1932/03/08 DOA: 06/05/2024  PCP: [name], NP  Outpatient Specialists: Dr. Tamea, pulmonologist Patient coming from: Home  I have personally briefly reviewed patient's old medical records in [facility] EMR.  Chief Concern: Vertigo, dizziness, cough  HPI: Crystal Gregory is a 89 year old female with history of interstitial lung disease, vertigo, history of nontraumatic compression fracture, rheumatoid arthritis, polyarthralgia, bronchiectasis, idiopathic pulmonary fibrosis, insomnia, GERD, who presents emergency department for chief concerns of dizziness, productive cough for about 2 weeks.  Vitals in the ED showed t of 98.5, rr 18, hr 98, blood pressure 114/68, SpO2 96% on room air.  Serum sodium is 127, potassium 4.1, chloride 93, bicarb 23, BUN 14, serum creatinine 0.44, EGFR greater than 60, nonfasting blood glucose 109, WBC 10.6, hemoglobin 9.5, platelets of 356.  BNP is 383.8, hs troponin is 15, Pro-Cal is 0.16, lactic acid 1.2, D-dimer 5.43, COVID/influenza A/influenza B/RSV PCR were negative.  ED treatment: Acetaminophen  1000 mg p.o. one-time dose, diltiazem  bolus and gtt., ceftriaxone  1 g IV one-time dose, doxycycline  100 mg IV, sodium chloride  500 mL bolus. ------------------------------- At bedside, patient was able to tell me her first and last name, age, location, current calendar year.  Patient and daughter reports that at baseline patient has had a cough that is productive for many years.  However over the last 3 to 4 days, the cough has gotten more acutely worse.  Patient reports that the sputum is green in nature.  She denies fever, vomiting, chest pain, abdominal pain, dysuria, hematuria, blood in her stool.  She endorses generalized nausea since developing the vertigo.  He denies trauma to person, fall, syncope or  Social history: She lives at [facility].  She denies tobacco, EtOH, recreational  drug use.  ROS: Constitutional: no weight change, no fever ENT/Mouth: no sore throat, no rhinorrhea Eyes: no eye pain, no vision changes Cardiovascular: no chest pain, + dyspnea,  no edema, no palpitations Respiratory: + cough, + sputum, no wheezing Gastrointestinal: + nausea, no vomiting, no diarrhea, no constipation Genitourinary: no urinary incontinence, no dysuria, no hematuria Musculoskeletal: no arthralgias, no myalgias Skin: no skin lesions, no pruritus, Neuro: + weakness, no loss of consciousness, no syncope, + vertigo, + dizziness Psych: no anxiety, no depression, + decrease appetite Heme/Lymph: no bruising, no bleeding  ED Course: Discussed with EDP, patient requiring hospitalization for chief concerns of bibasilar pneumonia, atrial fibrillation with RVR.  Assessment/Plan  Principal Problem:   Atrial fibrillation with RVR (HCC) Active Problems:   Insomnia   IPF (idiopathic pulmonary fibrosis) (HCC)   ILD (interstitial lung disease) (HCC)   DOE (dyspnea on exertion)   Vertigo   Hyponatremia   Multilobar lung infiltrate   Assessment and Plan:  * Atrial fibrillation with RVR (HCC) Diltiazem  10 mg IV one-time dose ordered Continue diltiazem  gtt. CHA2DS2VASc = 3 (age > 19, gender: Female) Discussed risks/benefits of anticoagulation with atrial fibrillation, including GI bleed, intracranial bleeding especially with trauma, increased bruising.  Patient advised to let PCP/provider know should she develop the symptoms. Patient endorses understanding of these risks and states she would like to be initiated on anticoagulation Eliquis  2.5 mg p.o. twice daily initiated Continue outpatient follow-up with cardiologist, Dr. Perla Heard lung infiltrate Bibasilar pneumonia Community-acquired pneumonia Continue with doxycycline  100 mg IV twice daily, ceftriaxone  2 g IV daily to complete a 5-day course  Hyponatremia Status post sodium chloride  500 mL liter bolus per  EDP On admission of  ordered LR 1 L bolus one-time dose ordered  Vertigo Patient states the meclizine  no longer works Resume to lorazepam  0.5 mg every 8 hours as needed for vertigo CT head imaging was considered however I noted that on 05/08/2024, patient had MRI of the brain with and without contrast outpatient for dizziness, nausea, vertigo symptoms for at least 6 weeks MRI brain w wo contrast: read as no acute intracranial abnormality.  Age-related atrophy and moderate periventricular and subcortical white matter disease.  Complete opacification of left maxillary sinus.  IPF (idiopathic pulmonary fibrosis) (HCC) Continue outpatient follow-up with pulmonologist  Insomnia Gabapentin  100 mg daily resumed Trazodone  50 mg nightly as needed for sleep ordered  Chart reviewed.   DVT prophylaxis: Eliquis  2.5 mg p.o. twice daily Code Status: Full code Diet: Heart healthy Family Communication: Updated daughter, Therisa at bedside with patient's permission Disposition Plan: Pending clinical course; guarded prognosis Consults called: PT, OT Admission status: Telemetry cardiac, inpatient  Past Medical History:  Diagnosis Date   Allergy    Anal fissure    Aspiration into airway    CAP (community acquired pneumonia) 08/23/2017   Chest pain    a. 09/04/2018 MV: EF >65%. No ischemia/infarct.   Coronary artery calcification seen on CT scan    Diastolic dysfunction    a. 10/2016 Echo: EF 60-65%, no rwma, Gr1 DD, mild MR, nl RV fxn, mild to mod TR, PASP .   Diverticulosis    Dyspnea    Edema of right lower extremity 01/29/2022   Fever 06/15/2023   GERD (gastroesophageal reflux disease)    Hemorrhoids    Hyperplastic colon polyp    Interstitial lung disorders (HCC)    Pulmonary fibrosis (HCC)    Septicemia (HCC) 1999   spent 4 weeks in ICU, intubated -? PNA   Vertebral fracture, pathological    Past Surgical History:  Procedure Laterality Date   ABDOMINAL HYSTERECTOMY     FLEXIBLE  BRONCHOSCOPY N/A 07/05/2016   Procedure: FLEXIBLE BRONCHOSCOPY;  Surgeon: Jorie Cha, MD;  Location: [facility] ORS;  Service: Cardiopulmonary;  Laterality: N/A;   IR RADIOLOGIST EVAL & MGMT  11/05/2021   Social History:  reports that she quit smoking about 43 years ago. Her smoking use included cigarettes. She started smoking about 63 years ago. She has a 5 pack-year smoking history. She has never used smokeless tobacco. She reports current alcohol use of about 7.0 standard drinks of alcohol per week. She reports that she does not use drugs.  No Known Allergies Family History  Problem Relation Age of Onset   Parkinson's disease Mother    Colon polyps Mother 8           Ulcers Father    Rheumatic fever Father    Macular degeneration Sister    Healthy Son    Healthy Daughter    Family history: Family history reviewed and not pertinent.  Prior to Admission medications   Medication Sig Start Date End Date Taking? Authorizing Provider  azaTHIOprine  (IMURAN ) 50 MG tablet TAKE ONE AND ONE-HALF TABLETS (75 MG) EVERY MORNING AND 1 TABLET (50 MG) EVERY EVENING 02/27/22   Dolphus Reiter, MD  azithromycin  (ZITHROMAX ) 250 MG tablet Take 1 tablet 3x a week 04/25/24   Tamea Dedra CROME, MD  denosumab  (PROLIA ) 60 MG/ML SOLN injection Inject 60 mg into the skin every 6 (six) months. Administer in upper arm, thigh, or abdomen    [provider]  esomeprazole  (NEXIUM ) 20 MG capsule Take 1 capsule (20 mg total)  by mouth 2 (two) times daily before a meal. 03/22/24   Tamea Dedra CROME, MD  gabapentin  (NEURONTIN ) 100 MG capsule Take 2 capsules (200 mg total) by mouth at bedtime. 01/09/24 01/08/25  Tamea Dedra CROME, MD  guaiFENesin -codeine  100-10 MG/5ML syrup Take 5 mLs by mouth every 8 (eight) hours as needed for cough. 05/29/24   Tamea Dedra CROME, MD  LORazepam  (ATIVAN ) 0.5 MG tablet Take 1 tablet (0.5 mg total) by mouth every 8 (eight) hours as needed (Vertigo). 05/03/24   Tamea Dedra CROME, MD   montelukast  (SINGULAIR ) 10 MG tablet Take 1 tablet (10 mg total) by mouth daily. 09/29/23   Tamea Dedra CROME, MD  Multiple Minerals-Vitamins (CALCIUM & VIT D3 BONE HEALTH PO) Take 1 Dose by mouth daily.    [provider]  ondansetron  (ZOFRAN ) 4 MG tablet Take 1 tablet (4 mg total) by mouth daily as needed for nausea or vomiting. 05/03/24 05/03/25  Tamea Dedra CROME, MD  predniSONE  (DELTASONE ) 1 MG tablet Take 3 mg by mouth daily with breakfast.    [provider]  propranolol  (INDERAL ) 10 MG tablet TAKE 1 TABLET TWICE A DAY FOR TREMOR 03/05/24   Clark, Katherine K, NP  traZODone  (DESYREL ) 50 MG tablet Take 1 tablet (50 mg total) by mouth at bedtime as needed for sleep. For sleep. 12/31/23   Clark, Katherine K, NP  vitamin B-12 (CYANOCOBALAMIN ) 500 MCG tablet Take 500 mcg by mouth daily.    [provider]   Physical Exam: Vitals:   06/05/24 1545 06/05/24 1600 06/05/24 1700 06/05/24 1800  BP:  (!) 103/54 (!) 105/49 (!) 96/49  Pulse: (!) 110 (!) 107 90 83  Resp: (!) 29 (!) 22 (!) 24 (!) 25  Temp:      TempSrc:      SpO2: 96% 96% 99% 98%  Weight:      Height:       Constitutional: appears age-appropriate, NAD, calm Eyes: PERRL, lids and conjunctivae normal ENMT: Mucous membranes are moist. Posterior pharynx clear of any exudate or lesions. Age-appropriate dentition. Hearing appropriate Neck: normal, supple, no masses, no thyromegaly Respiratory: Decreased lung sounds and bibasilar lobes, with crackles worse on the left crackles.  Increased respiratory effort.  Mild accessory muscle use.  Nasal cannula in place Cardiovascular: iregular rate and rhythm, no murmurs / rubs / gallops. No extremity edema. 2+ pedal pulses. No carotid bruits.  Abdomen: no tenderness, no masses palpated, no hepatosplenomegaly. Bowel sounds positive.  Musculoskeletal: no clubbing / cyanosis. No joint deformity upper and lower extremities. Good ROM, no contractures, no atrophy. Normal muscle  tone.  Skin: no rashes, lesions, ulcers. No induration Neurologic: Sensation intact. Strength 5/5 in all 4.  Psychiatric: Normal judgment and insight. Alert and oriented x 3. Normal mood.   EKG: independently reviewed, showing atrial fibrillation with rate 129, QTc 413  Chest x-ray on Admission: I personally reviewed and I agree with radiologist reading as below.  US  Venous Img Lower Bilateral Result Date: 06/05/2024 CLINICAL DATA:  elevated d-dimer EXAM: BILATERAL LOWER EXTREMITY VENOUS DOPPLER ULTRASOUND TECHNIQUE: Gray-scale sonography with graded compression, as well as color Doppler and duplex ultrasound were performed to evaluate the lower extremity deep venous systems from the level of the common femoral vein and including the common femoral, femoral, profunda femoral, popliteal and calf veins including the posterior tibial, peroneal and gastrocnemius veins when visible. The superficial great saphenous vein was also interrogated. Spectral Doppler was utilized to evaluate flow at rest and with distal augmentation  maneuvers in the common femoral, femoral and popliteal veins. COMPARISON:  January 12, 2022 FINDINGS: RIGHT LOWER EXTREMITY Common Femoral Vein: No evidence of thrombus. Normal compressibility, respiratory phasicity and response to augmentation. Saphenofemoral Junction: No evidence of thrombus. Normal compressibility and flow on color Doppler imaging. Profunda Femoral Vein: No evidence of thrombus. Normal compressibility and flow on color Doppler imaging. Femoral Vein: No evidence of thrombus. Normal compressibility, respiratory phasicity and response to augmentation. Popliteal Vein: No evidence of thrombus. Normal compressibility, respiratory phasicity and response to augmentation. Calf Veins: No evidence of thrombus. Normal compressibility and flow on color Doppler imaging. Superficial Great Saphenous Vein: No evidence of thrombus. Normal compressibility. Other Findings:  The small saphenous  vein is thrombosed. LEFT LOWER EXTREMITY Common Femoral Vein: No evidence of thrombus. Normal compressibility, respiratory phasicity and response to augmentation. Saphenofemoral Junction: No evidence of thrombus. Normal compressibility and flow on color Doppler imaging. Profunda Femoral Vein: No evidence of thrombus. Normal compressibility and flow on color Doppler imaging. Femoral Vein: No evidence of thrombus. Normal compressibility, respiratory phasicity and response to augmentation. Popliteal Vein: No evidence of thrombus. Normal compressibility, respiratory phasicity and response to augmentation. Calf Veins: No evidence of thrombus. Normal compressibility and flow on color Doppler imaging. Superficial Great Saphenous Vein: No evidence of thrombus. Normal compressibility. Other Findings: Likely popliteal cysts bilaterally, measuring up to 3.5 cm on the right. IMPRESSION: 1. Negative for deep venous thrombosis within both legs. 2. Positive for superficial venous thrombosis in the small saphenous vein on the right. Electronically Signed   By: Rogelia Myers M.D.   On: 06/05/2024 18:32   CT Angio Chest PE W and/or Wo Contrast Result Date: 06/05/2024 CLINICAL DATA:  Elevated D-dimer.  Concern for pulmonary embolism EXAM: CT ANGIOGRAPHY CHEST WITH CONTRAST TECHNIQUE: Multidetector CT imaging of the chest was performed using the standard protocol during bolus administration of intravenous contrast. Multiplanar CT image reconstructions and MIPs were obtained to evaluate the vascular anatomy. RADIATION DOSE REDUCTION: This exam was performed according to the departmental dose-optimization program which includes automated exposure control, adjustment of the mA and/or kV according to patient size and/or use of iterative reconstruction technique. CONTRAST:  75mL OMNIPAQUE  IOHEXOL  350 MG/ML SOLN COMPARISON:  Chest CT 05/05/2021 FINDINGS: Cardiovascular: No filling defects within the pulmonary arteries to suggest acute  pulmonary embolism. Mediastinum/Nodes: Marked dilatation the proximal esophagus. The esophagus above the carina measures 5.4 cm in diameter. There is gas filled esophagus and GE junction. The more distal esophagus is normal caliber at 1.2 cm. No fluid esophagus. No food stuff esophagus. Esophageal dilatation is similar tube of progressed from CT 2022. No mediastinal adenopathy.  Trachea normal Lungs/Pleura: There is dense bibasilar airspace disease airspace disease extends into the lingula and RIGHT middle lobe. Six dense of bronchiectasis in the lower lobes. Upper Abdomen: Limited view of the liver, kidneys, pancreas are unremarkable. Normal adrenal glands. Musculoskeletal: Very little body fat. No aggressive osseous lesion compression fracture of the lower thoracic spine appears chronic. No acute fracture Review of the MIP images confirms the above findings. IMPRESSION: 1. No evidence acute pulmonary embolism. 2. Dense bibasilar airspace disease with bronchiectasis. Findings concerning severe bibasilar pneumonia. 3. Marked dilatation of the proximal esophagus. No fluid in the esophagus. No evidence of esophageal obstruction. 4. Very little body fat. Electronically Signed   By: Jackquline Boxer M.D.   On: 06/05/2024 17:01   DG Chest 1 View Result Date: 06/05/2024 CLINICAL DATA:  Dizziness, productive cough, COPD; evaluate for pleural edema EXAM:  CHEST  1 VIEW COMPARISON:  November 04, 2023 May 05, 2021 FINDINGS: Biapical pleuroparenchymal scarring. Chronic changes of interstitial lung disease again noted. Increasing patchy airspace opacities in the right mid and left lower lung zones. No pneumothorax. Chronic blunting of the right costophrenic sulcus, unchanged. No cardiomegaly. Aortic atherosclerosis. No acute fracture or destructive lesions. Multilevel thoracic osteophytosis. Osteopenia. IMPRESSION: Underlying changes of chronic interstitial lung disease again noted. Increasing patchy airspace opacities in the  right mid and left lower lung zones, worrisome for superimposed bronchopneumonia. Electronically Signed   By: Rogelia Myers M.D.   On: 06/05/2024 14:24   Labs on Admission: I have personally reviewed following labs  CBC: Recent Labs  Lab 06/05/24 1258  WBC 10.6*  NEUTROABS 9.4*  HGB 9.5*  HCT 28.1*  MCV 102.2*  PLT 356   Basic Metabolic Panel: Recent Labs  Lab 06/05/24 1258  NA 127*  K 4.1  CL 93*  CO2 23  GLUCOSE 109*  BUN 14  CREATININE 0.44  CALCIUM 7.8*  MG 1.8   GFR: Estimated Creatinine Clearance: 36.2 mL/min (by C-G formula based on SCr of 0.44 mg/dL).  Liver Function Tests: Recent Labs  Lab 06/05/24 1258  AST 23  ALT 17  ALKPHOS 110  BILITOT 0.9  PROT 6.4*  ALBUMIN 2.4*   Urine analysis:    Component Value Date/Time   COLORURINE AMBER (A) 08/23/2017 2108   APPEARANCEUR CLOUDY (A) 08/23/2017 2108   LABSPEC 1.021 08/23/2017 2108   PHURINE 5.0 08/23/2017 2108   GLUCOSEU NEGATIVE 08/23/2017 2108   HGBUR NEGATIVE 08/23/2017 2108   BILIRUBINUR neg 06/16/2023 1251   KETONESUR NEGATIVE 08/23/2017 2108   PROTEINUR Positive (A) 06/16/2023 1251   PROTEINUR 100 (A) 08/23/2017 2108   UROBILINOGEN 0.2 06/16/2023 1251   NITRITE neg 06/16/2023 1251   NITRITE NEGATIVE 08/23/2017 2108   LEUKOCYTESUR Negative 06/16/2023 1251   CRITICAL CARE Performed by: Dr. Sherre  Total critical care time: 32 minutes  Critical care time was exclusive of separately billable procedures and treating other patients.  Critical care was necessary to treat or prevent imminent or life-threatening deterioration.  Critical care was time spent personally by me on the following activities: development of treatment plan with patient and daughter, as well as nursing, discussions with consultants, evaluation of patient's response to treatment, examination of patient, obtaining history from patient or surrogate, ordering and performing treatments and interventions, ordering and review of  laboratory studies, ordering and review of radiographic studies, pulse oximetry and re-evaluation of patient's condition.  This document was prepared using Dragon Voice Recognition software and may include unintentional dictation errors.  Dr. Sherre [facility]  If 7PM-7AM, please contact overnight-coverage provider If 7AM-7PM, please contact day attending provider [email or web address]  06/05/2024, 6:46 PM

## 2024-06-05 NOTE — ED Triage Notes (Signed)
 Pt arrives via [facility] from [facility] independent c/o dizziness x2 weeks. Pt has been taking zofran  to manage the nausea associated with the dizziness. Pt also c/o productive cough with [place] colored sputum. Pt with hx COPD and afib. EMS gave zofran  IVP for nausea en route.    99.61f CBG 124

## 2024-06-05 NOTE — ED Notes (Signed)
 Patient transported to CT

## 2024-06-05 NOTE — ED Notes (Signed)
 Family out to nurses station reporting pt cannot breath. Pt sitting up in bed spitting into napping and wheezing. Pt reports has been spitting up phlegm and feels she is choking on it. Oxygen  sats 98% at this time. Primary RN made aware.

## 2024-06-06 ENCOUNTER — Telehealth (HOSPITAL_COMMUNITY): Payer: Self-pay | Admitting: Pharmacy Technician

## 2024-06-06 ENCOUNTER — Encounter: Payer: Self-pay | Admitting: Oncology

## 2024-06-06 ENCOUNTER — Other Ambulatory Visit (HOSPITAL_COMMUNITY): Payer: Self-pay

## 2024-06-06 ENCOUNTER — Inpatient Hospital Stay (HOSPITAL_COMMUNITY)
Admit: 2024-06-06 | Discharge: 2024-06-06 | Disposition: A | Discharge status: Home / Self Care | Attending: Student | Admitting: Student

## 2024-06-06 DIAGNOSIS — R918 Other nonspecific abnormal finding of lung field: Secondary | ICD-10-CM | POA: Diagnosis not present

## 2024-06-06 DIAGNOSIS — J189 Pneumonia, unspecified organism: Secondary | ICD-10-CM | POA: Diagnosis not present

## 2024-06-06 DIAGNOSIS — I4891 Unspecified atrial fibrillation: Secondary | ICD-10-CM | POA: Diagnosis not present

## 2024-06-06 DIAGNOSIS — E871 Hypo-osmolality and hyponatremia: Secondary | ICD-10-CM

## 2024-06-06 DIAGNOSIS — R42 Dizziness and giddiness: Secondary | ICD-10-CM | POA: Diagnosis not present

## 2024-06-06 LAB — CBC
HCT: 27.2 % — ABNORMAL LOW (ref 36.0–46.0)
Hemoglobin: 9 g/dL — ABNORMAL LOW (ref 12.0–15.0)
MCH: 34.2 pg — ABNORMAL HIGH (ref 26.0–34.0)
MCHC: 33.1 g/dL (ref 30.0–36.0)
MCV: 103.4 fL — ABNORMAL HIGH (ref 80.0–100.0)
Platelets: 319 K/uL (ref 150–400)
RBC: 2.63 MIL/uL — ABNORMAL LOW (ref 3.87–5.11)
RDW: 15.1 % (ref 11.5–15.5)
WBC: 8.5 K/uL (ref 4.0–10.5)
nRBC: 0 % (ref 0.0–0.2)

## 2024-06-06 LAB — ECHOCARDIOGRAM COMPLETE
AR max vel: 2.89 cm2
AV Area VTI: 3.3 cm2
AV Area mean vel: 2.61 cm2
AV Mean grad: 3.5 mmHg
AV Peak grad: 6 mmHg
Ao pk vel: 1.23 m/s
Area-P 1/2: 3.6 cm2
Height: 68 in
MV VTI: 2.87 cm2
S' Lateral: 1.9 cm
Weight: 1728 [oz_av]

## 2024-06-06 LAB — BASIC METABOLIC PANEL WITH GFR
Anion gap: 7 (ref 5–15)
BUN: 13 mg/dL (ref 8–23)
CO2: 27 mmol/L (ref 22–32)
Calcium: 7.8 mg/dL — ABNORMAL LOW (ref 8.9–10.3)
Chloride: 98 mmol/L (ref 98–111)
Creatinine, Ser: 0.5 mg/dL (ref 0.44–1.00)
GFR, Estimated: >60 mL/min (ref >60)
Glucose, Bld: 150 mg/dL — ABNORMAL HIGH (ref 70–99)
Potassium: 3.7 mmol/L (ref 3.5–5.1)
Sodium: 132 mmol/L — ABNORMAL LOW (ref 135–145)

## 2024-06-06 LAB — PHOSPHORUS: Phosphorus: 2.3 mg/dL — ABNORMAL LOW (ref 2.5–4.6)

## 2024-06-06 LAB — MAGNESIUM: Magnesium: 1.9 mg/dL (ref 1.7–2.4)

## 2024-06-06 LAB — OSMOLALITY: Osmolality: 278 mosm/kg (ref 275–295)

## 2024-06-06 MED ORDER — PANTOPRAZOLE SODIUM 40 MG PO TBEC
40.0000 mg | DELAYED_RELEASE_TABLET | Freq: Every day | ORAL | Status: DC
  Administered 2024-06-06 – 2024-06-10 (×5): 40 mg via ORAL
  Filled 2024-06-06 (×5): qty 1

## 2024-06-06 MED ORDER — MECLIZINE HCL 25 MG PO TABS
25.0000 mg | ORAL_TABLET | Freq: Two times a day (BID) | ORAL | Status: DC
  Filled 2024-06-06 (×2): qty 1

## 2024-06-06 MED ORDER — POTASSIUM CHLORIDE CRYS ER 20 MEQ PO TBCR
20.0000 meq | EXTENDED_RELEASE_TABLET | Freq: Once | ORAL | Status: AC
  Administered 2024-06-06: 20 meq via ORAL
  Filled 2024-06-06: qty 1

## 2024-06-06 MED ORDER — PREDNISONE 1 MG PO TABS
3.0000 mg | ORAL_TABLET | Freq: Every day | ORAL | Status: DC
  Administered 2024-06-07: 3 mg via ORAL
  Filled 2024-06-06: qty 3

## 2024-06-06 MED ORDER — MAGNESIUM SULFATE 2 GM/50ML IV SOLN
2.0000 g | Freq: Once | INTRAVENOUS | Status: AC
  Administered 2024-06-06: 2 g via INTRAVENOUS
  Filled 2024-06-06: qty 50

## 2024-06-06 MED ORDER — ENSURE PLUS HIGH PROTEIN PO LIQD
237.0000 mL | Freq: Two times a day (BID) | ORAL | Status: DC
  Administered 2024-06-07 – 2024-06-12 (×6): 237 mL via ORAL

## 2024-06-06 MED ORDER — GUAIFENESIN ER 600 MG PO TB12
600.0000 mg | ORAL_TABLET | Freq: Two times a day (BID) | ORAL | Status: DC
  Administered 2024-06-06 – 2024-06-09 (×6): 600 mg via ORAL
  Filled 2024-06-06 (×11): qty 1

## 2024-06-06 NOTE — Evaluation (Signed)
 Occupational Therapy Evaluation Patient Details Name: Crystal Gregory MRN: [id number] DOB: 04-24-1932 Today's Date: 06/06/2024   History of Present Illness   Pt is a 89 year old female who presents to ED for chief concerns of dizziness, productive cough for about 2 weeks. MD assessment includes: A-fib with RVR, multilobar lung infiltrate and hyponatremia. PMH of interstitial lung disease, vertigo, history of nontraumatic compression fracture, rheumatoid arthritis, polyarthralgia, bronchiectasis, idiopathic pulmonary fibrosis, insomnia, GERD.     Clinical Impressions Pt was seen for OT evaluation this date. PTA, pt resides in a one level cottage at [facility] with 1 small STE. She reports being IND at baseline with all tasks, including cooking, managing her meals, meds and driving.  Pt presents with deficits in activity tolerance, strength, and balance, affecting safe and optimal ADL completion. Pt currently requires Min A for bed mobility tasks for BLE management. She is requiring Min/Mod A for STS from bed and low height toilet using grab bar. Pt performed functional mobility using RW with CGA ~15 ft with cues for safety as pt does not typically utilized a RW. Pt fatigued very easily with HR up to 122 with activity, coughing spells and dizziness with transitional movements. Pt has a history of vertigo and is well versed  in allowing dizziness to pass prior to movements. She is far from her baseline at this time and would not be safe to return home alone unless she has family support or hired help 24/7. Edu on importance of movement and sitting up to help clear the PNA. Her and family verbalized understanding. Pt would benefit from skilled OT services to address noted impairments and functional limitations to maximize safety and independence while minimizing future risk of falls, injury, and readmission. Do anticipate the need for follow up OT services upon acute hospital DC.      If plan is  discharge home, recommend the following:   A little help with walking and/or transfers;A lot of help with bathing/dressing/bathroom;Assistance with cooking/housework;Help with stairs or ramp for entrance     Functional Status Assessment         Equipment Recommendations   BSC/3in1     Recommendations for Other Services         Precautions/Restrictions   Precautions Precautions: Fall Recall of Precautions/Restrictions: Intact Restrictions Weight Bearing Restrictions Per Provider Order: No     Mobility Bed Mobility Overal bed mobility: Needs Assistance Bed Mobility: Supine to Sit, Sit to Supine     Supine to sit: Min assist, HOB elevated, Used rails Sit to supine: Min assist   General bed mobility comments: BLE assist for bed mobility tasks    Transfers Overall transfer level: Needs assistance Equipment used: Rolling walker (2 wheels) Transfers: Sit to/from Stand Sit to Stand: Min assist           General transfer comment: Min A for transfers, cues for hand placement, pt admits to increased weakness      Balance Overall balance assessment: Needs assistance Sitting-balance support: Feet supported, Bilateral upper extremity supported Sitting balance-Leahy Scale: Fair     Standing balance support: Reliant on assistive device for balance, Bilateral upper extremity supported Standing balance-Leahy Scale: Poor Standing balance comment: use of RW and CGA for safety d/t vertigo                           ADL either performed or assessed with clinical judgement   ADL Overall ADL's : Needs assistance/impaired  Grooming: Wash/dry hands;Standing;Contact guard Counsellor: Rolling walker (2 wheels);Minimal assistance;Grab bars Toilet Transfer Details (indicate cue type and reason): lift off from low toilet height with use of grab bar Toileting- Clothing Manipulation and Hygiene: Moderate assistance;Sit  to/from stand Toileting - Clothing Manipulation Details (indicate cue type and reason): for thoroughness after BM     Functional mobility during ADLs: Contact guard assist;Rolling walker (2 wheels)       Vision         Perception         Praxis         Pertinent Vitals/Pain Pain Assessment Pain Assessment: Faces Faces Pain Scale: Hurts little more Pain Location: overall generalized pain and soreness from coughing Pain Descriptors / Indicators: Sore Pain Intervention(s): Monitored during session, Repositioned     Extremity/Trunk Assessment Upper Extremity Assessment Upper Extremity Assessment: Generalized weakness   Lower Extremity Assessment Lower Extremity Assessment: Generalized weakness       Communication Communication Communication: No apparent difficulties Factors Affecting Communication: Hearing impaired (wears hearing aids)   Cognition Arousal: Alert Behavior During Therapy: WFL for tasks assessed/performed Cognition: No apparent impairments                               Following commands: Intact       Cueing  General Comments      HR up to 122 at most during activity on tele, increased coughing and weakness   Exercises Other Exercises Other Exercises: Edu on role of OT in acute setting.   Shoulder Instructions      Home Living Family/patient expects to be discharged to:: Private residence Living Arrangements: Alone Available Help at Discharge: Family;Available PRN/intermittently Type of Home: House [facility] ILF) Home Access: Stairs to enter [employer] of Steps: 1 small step-unsure about rails   Home Layout: One level                          Prior Functioning/Environment Prior Level of Function : Independent/Modified Independent;Driving             Mobility Comments: does not utilize AD at baseline, IND; drives ADLs Comments: IND    OT Problem List: Decreased strength;Decreased  activity tolerance;Impaired balance (sitting and/or standing);Pain   OT Treatment/Interventions: Self-care/ADL training;Balance training;Therapeutic exercise;Therapeutic activities;Energy conservation;DME and/or AE instruction;Patient/family education      OT Goals(Current goals can be found in the care plan section)   Acute Rehab OT Goals Patient Stated Goal: improve function OT Goal Formulation: With patient/family Time For Goal Achievement: 06/20/24 Potential to Achieve Goals: Fair ADL Goals Pt Will Perform Grooming: with set-up;standing Pt Will Perform Lower Body Bathing: with supervision;sit to/from stand;sitting/lateral leans Pt Will Perform Lower Body Dressing: with supervision;sit to/from stand;sitting/lateral leans Pt Will Transfer to Toilet: with supervision;ambulating   OT Frequency:  Min 2X/week    Co-evaluation              AM-PAC OT 6 Clicks Daily Activity     Outcome Measure Help from another person eating meals?: None Help from another person taking care of personal grooming?: None Help from another person toileting, which includes using toliet, bedpan, or urinal?: A Lot Help from another person bathing (including washing, rinsing, drying)?: A Lot Help from another person to put on and taking  off regular upper body clothing?: A Little Help from another person to put on and taking off regular lower body clothing?: A Lot 6 Click Score: 17   End of Session Equipment Utilized During Treatment: Gait belt;Rolling walker (2 wheels) Nurse Communication: Mobility status  Activity Tolerance: Patient tolerated treatment well Patient left: in bed;with call bell/phone within reach;with bed alarm set;with family/visitor present  OT Visit Diagnosis: Other abnormalities of gait and mobility (R26.89);Muscle weakness (generalized) (M62.81)                Time: 8377-8351 OT Time Calculation (min): 26 min Charges:  OT General Charges $OT Visit: 1 Visit OT  Evaluation $OT Eval Moderate Complexity: 1 Mod OT Treatments $Self Care/Home Management : 8-22 mins Tipton Ballow, OTR/L  06/06/24, 5:21 PM  Judythe Postema E Tishawn Friedhoff 06/06/2024, 5:07 PM

## 2024-06-06 NOTE — ED Notes (Signed)
 This RN provided minimal assistance to pt from bed to toilet while using walker. Pt is back in bed, bed is in lowest, locked position. Pt tolerated activity well. Call bell is within reach.

## 2024-06-06 NOTE — Progress Notes (Signed)
*  PRELIMINARY RESULTS* Echocardiogram 2D Echocardiogram has been performed.  Crystal Gregory 06/06/2024, 1:42 PM

## 2024-06-06 NOTE — ED Notes (Signed)
Provided cranberry juice.

## 2024-06-06 NOTE — ED Notes (Signed)
 Pt declined meclizine  and says she has had vertigo for a long time and it never works for her.

## 2024-06-06 NOTE — Progress Notes (Addendum)
 OT Cancellation Note  Patient Details Name: Crystal Gregory MRN: [id number] DOB: 02/15/1932   Cancelled Treatment:    Reason Eval/Treat Not Completed: Other (comment) Chart reviewed and eval attempted at 0912am, however pt had just returned from the bathroom with nursing and is awaiting breakfast and requests OT to return at a later time. PT attempting evaluation around 1155am and pt declining and states she wishes to hold off on therapy evaluations until tomorrow 9/4. Will follow up as indicated.  Bedford Winsor [name] 06/06/2024, 11:53 AM

## 2024-06-06 NOTE — Telephone Encounter (Signed)
 Patient Product/process development scientist completed.    The patient is insured through [employer].     Ran test claim for Eliquis 5 mg and the current 30 day co-pay is $43.00.   This test claim was processed through [facility]- copay amounts may vary at other pharmacies due to [employer], or as the patient moves through the different stages of their insurance plan.     Roland Earl, CPHT Pharmacy Technician III Certified Patient Advocate [facility] Patient Advocate Team Direct Number: [phone]  Fax: [phone]

## 2024-06-06 NOTE — Progress Notes (Signed)
 [facility] Progress Note  Patient: Crystal Gregory    FMW:[id number]  DOA: 06/05/2024     Date of Service: the patient was seen and examined on 06/06/2024  Chief Complaint  Patient presents with   Dizziness   Brief hospital course: [name] is a 89 year old female with history of interstitial lung disease, vertigo, history of nontraumatic compression fracture, rheumatoid arthritis, polyarthralgia, bronchiectasis, idiopathic pulmonary fibrosis, insomnia, GERD, who presents emergency department for chief concerns of dizziness, productive cough for about 2 weeks.   Vitals in the ED showed t of 98.5, rr 18, hr 98, blood pressure 114/68, SpO2 96% on room air.   Serum sodium is 127, potassium 4.1, chloride 93, bicarb 23, BUN 14, serum creatinine 0.44, EGFR greater than 60, nonfasting blood glucose 109, WBC 10.6, hemoglobin 9.5, platelets of 356.   BNP is 383.8, hs troponin is 15, Pro-Cal is 0.16, lactic acid 1.2, D-dimer 5.43, COVID/influenza A/influenza B/RSV PCR were negative.   ED treatment: Acetaminophen  1000 mg p.o. one-time dose, diltiazem  bolus and gtt., ceftriaxone  1 g IV one-time dose, doxycycline  100 mg IV, sodium chloride  500 mL bolus.   Assessment and Plan:  # Atrial fibrillation with RVR (HCC) S/p Diltiazem  10 mg IV one-time dose Continue diltiazem  gtt. CHA2DS2VASc = 3 (age > 70, gender: Female) Discussed risks/benefits of anticoagulation with atrial fibrillation, including GI bleed, intracranial bleeding especially with trauma, increased bruising.  Patient advised to let PCP/provider know should she develop the symptoms. Patient endorses understanding of these risks and states she would like to be initiated on anticoagulation Eliquis  2.5 mg p.o. twice daily initiated [facility] cardiology   # Multilobar lung infiltrate Bibasilar Community-acquired pneumonia Continue with doxycycline  100 mg IV twice daily, ceftriaxone  2 g IV daily to complete a 5-day  course Mucinex  600 mg p.o. twice daily, Tussionex prn   # Isotonic hyponatremia Status post sodium chloride  500 mL liter bolus per EDP On admission of ordered LR 1 L bolus one-time dose ordered Serum osmolality 278 within normal range Started fluid striction for fluid overloaded   # Vertigo Patient states the meclizine  no longer works Resume to lorazepam  0.5 mg every 8 hours as needed for vertigo CT head imaging was considered however I noted that on 05/08/2024, patient had MRI of the brain with and without contrast outpatient for dizziness, nausea, vertigo symptoms for at least 6 weeks MRI brain w wo contrast: read as no acute intracranial abnormality.  Age-related atrophy and moderate periventricular and subcortical white matter disease.  Complete opacification of left maxillary sinus. PT consulted for Epley's maneuver.  # IPF (idiopathic pulmonary fibrosis) (HCC) Continue outpatient follow-up with pulmonologist   # Insomnia Gabapentin  100 mg daily resumed Trazodone  50 mg nightly as needed for sleep ordered   # GERD, continue PPI # Rheumatoid arthritis, continued prednisone  3 mg p.o. daily home dose.    Body mass index is 16.42 kg/m.  Interventions:  Diet: Heart Healthy DVT Prophylaxis: Eliquis   Advance goals of care discussion: Full code  Family Communication: family was present at bedside, at the time of interview.  The pt provided permission to discuss medical plan with the family. Opportunity was given to ask question and all questions were answered satisfactorily.   Disposition:  Pt is from ALF [facility], admitted with PNA nad Afib, still has Resp failure and on Iv meds, which precludes a safe discharge. Discharge to ALF, when stable and cleared by cards, most likely 3 days.  Subjective: No significant events overnight.  Patient  is feeling dizzy which she has chronic for past 6 months, does not feel any improvement in the breathing and still has mild throat to  cough but nothing is coming out. Generalized body aches and pain in the hands which is chronic secondary to rheumatoid arthritis. Denied any chest pain or palpitations, no abdominal pain.  No nausea or vomiting.   Physical Exam: General: NAD, lying comfortably Appear in mild distress, affect appropriate Eyes: PERRLA ENT: Oral Mucosa Clear, moist  Neck: no JVD,  Cardiovascular: S1 and S2 Present, no Murmur,  Respiratory: Equal air entry bilaterally, mild bilateral crackles, no wheezes  Abdomen: BS present, Soft and no tenderness,  Skin: no rashes Extremities: no Pedal edema, no calf tenderness Neurologic: without any new focal findings Gait not checked due to patient safety concerns  Vitals:   06/06/24 0600 06/06/24 0601 06/06/24 0730 06/06/24 0743  BP: (!) 113/52  (!) 111/48   Pulse: 73  74   Resp: (!) 23  (!) 21   Temp:  (!) 97.5 F (36.4 C)  97.8 F (36.6 C)  TempSrc:  Oral  Oral  SpO2: 98%  97%   Weight:      Height:        Intake/Output Summary (Last 24 hours) at 06/06/2024 0846 Last data filed at 06/06/2024 0050 Gross per 24 hour  Intake 2099.95 ml  Output --  Net 2099.95 ml   Filed Weights   06/05/24 1254  Weight: 49 kg    Data Reviewed: I have personally reviewed and interpreted daily labs, tele strips, imagings as discussed above. I reviewed all nursing notes, pharmacy notes, vitals, pertinent old records I have discussed plan of care as described above with RN and patient/family.  CBC: Recent Labs  Lab 06/05/24 1258 06/06/24 0517  WBC 10.6* 8.5  NEUTROABS 9.4*  --   HGB 9.5* 9.0*  HCT 28.1* 27.2*  MCV 102.2* 103.4*  PLT 356 319   Basic Metabolic Panel: Recent Labs  Lab 06/05/24 1258 06/06/24 0517  NA 127* 132*  K 4.1 3.7  CL 93* 98  CO2 23 27  GLUCOSE 109* 150*  BUN 14 13  CREATININE 0.44 0.50  CALCIUM 7.8* 7.8*  MG 1.8  --     Studies: US  Venous Img Lower Bilateral Result Date: 06/05/2024 CLINICAL DATA:  elevated d-dimer EXAM:  BILATERAL LOWER EXTREMITY VENOUS DOPPLER ULTRASOUND TECHNIQUE: Gray-scale sonography with graded compression, as well as color Doppler and duplex ultrasound were performed to evaluate the lower extremity deep venous systems from the level of the common femoral vein and including the common femoral, femoral, profunda femoral, popliteal and calf veins including the posterior tibial, peroneal and gastrocnemius veins when visible. The superficial great saphenous vein was also interrogated. Spectral Doppler was utilized to evaluate flow at rest and with distal augmentation maneuvers in the common femoral, femoral and popliteal veins. COMPARISON:  January 12, 2022 FINDINGS: RIGHT LOWER EXTREMITY Common Femoral Vein: No evidence of thrombus. Normal compressibility, respiratory phasicity and response to augmentation. Saphenofemoral Junction: No evidence of thrombus. Normal compressibility and flow on color Doppler imaging. Profunda Femoral Vein: No evidence of thrombus. Normal compressibility and flow on color Doppler imaging. Femoral Vein: No evidence of thrombus. Normal compressibility, respiratory phasicity and response to augmentation. Popliteal Vein: No evidence of thrombus. Normal compressibility, respiratory phasicity and response to augmentation. Calf Veins: No evidence of thrombus. Normal compressibility and flow on color Doppler imaging. Superficial Great Saphenous Vein: No evidence of thrombus. Normal compressibility. Other Findings:  The small saphenous vein is thrombosed. LEFT LOWER EXTREMITY Common Femoral Vein: No evidence of thrombus. Normal compressibility, respiratory phasicity and response to augmentation. Saphenofemoral Junction: No evidence of thrombus. Normal compressibility and flow on color Doppler imaging. Profunda Femoral Vein: No evidence of thrombus. Normal compressibility and flow on color Doppler imaging. Femoral Vein: No evidence of thrombus. Normal compressibility, respiratory phasicity and  response to augmentation. Popliteal Vein: No evidence of thrombus. Normal compressibility, respiratory phasicity and response to augmentation. Calf Veins: No evidence of thrombus. Normal compressibility and flow on color Doppler imaging. Superficial Great Saphenous Vein: No evidence of thrombus. Normal compressibility. Other Findings: Likely popliteal cysts bilaterally, measuring up to 3.5 cm on the right. IMPRESSION: 1. Negative for deep venous thrombosis within both legs. 2. Positive for superficial venous thrombosis in the small saphenous vein on the right. Electronically Signed   By: Rogelia Myers M.D.   On: 06/05/2024 18:32   CT Angio Chest PE W and/or Wo Contrast Result Date: 06/05/2024 CLINICAL DATA:  Elevated D-dimer.  Concern for pulmonary embolism EXAM: CT ANGIOGRAPHY CHEST WITH CONTRAST TECHNIQUE: Multidetector CT imaging of the chest was performed using the standard protocol during bolus administration of intravenous contrast. Multiplanar CT image reconstructions and MIPs were obtained to evaluate the vascular anatomy. RADIATION DOSE REDUCTION: This exam was performed according to the departmental dose-optimization program which includes automated exposure control, adjustment of the mA and/or kV according to patient size and/or use of iterative reconstruction technique. CONTRAST:  75mL OMNIPAQUE  IOHEXOL  350 MG/ML SOLN COMPARISON:  Chest CT 05/05/2021 FINDINGS: Cardiovascular: No filling defects within the pulmonary arteries to suggest acute pulmonary embolism. Mediastinum/Nodes: Marked dilatation the proximal esophagus. The esophagus above the carina measures 5.4 cm in diameter. There is gas filled esophagus and GE junction. The more distal esophagus is normal caliber at 1.2 cm. No fluid esophagus. No food stuff esophagus. Esophageal dilatation is similar tube of progressed from CT 2022. No mediastinal adenopathy.  Trachea normal Lungs/Pleura: There is dense bibasilar airspace disease airspace  disease extends into the lingula and RIGHT middle lobe. Six dense of bronchiectasis in the lower lobes. Upper Abdomen: Limited view of the liver, kidneys, pancreas are unremarkable. Normal adrenal glands. Musculoskeletal: Very little body fat. No aggressive osseous lesion compression fracture of the lower thoracic spine appears chronic. No acute fracture Review of the MIP images confirms the above findings. IMPRESSION: 1. No evidence acute pulmonary embolism. 2. Dense bibasilar airspace disease with bronchiectasis. Findings concerning severe bibasilar pneumonia. 3. Marked dilatation of the proximal esophagus. No fluid in the esophagus. No evidence of esophageal obstruction. 4. Very little body fat. Electronically Signed   By: Jackquline Boxer M.D.   On: 06/05/2024 17:01   DG Chest 1 View Result Date: 06/05/2024 CLINICAL DATA:  Dizziness, productive cough, COPD; evaluate for pleural edema EXAM: CHEST  1 VIEW COMPARISON:  November 04, 2023 May 05, 2021 FINDINGS: Biapical pleuroparenchymal scarring. Chronic changes of interstitial lung disease again noted. Increasing patchy airspace opacities in the right mid and left lower lung zones. No pneumothorax. Chronic blunting of the right costophrenic sulcus, unchanged. No cardiomegaly. Aortic atherosclerosis. No acute fracture or destructive lesions. Multilevel thoracic osteophytosis. Osteopenia. IMPRESSION: Underlying changes of chronic interstitial lung disease again noted. Increasing patchy airspace opacities in the right mid and left lower lung zones, worrisome for superimposed bronchopneumonia. Electronically Signed   By: Rogelia Myers M.D.   On: 06/05/2024 14:24    Scheduled Meds:  apixaban   2.5 mg Oral BID   denosumab   60 mg Subcutaneous Once   [START ON 08/04/2024] denosumab   60 mg Subcutaneous Once   diltiazem   15 mg Intravenous Once   gabapentin   100 mg Oral QHS   Continuous Infusions:  cefTRIAXone  (ROCEPHIN )  IV     diltiazem  (CARDIZEM ) infusion 5  mg/hr (06/06/24 0248)   doxycycline  (VIBRAMYCIN ) IV Stopped (06/06/24 0050)   PRN Meds: acetaminophen  **OR** acetaminophen , chlorpheniramine-HYDROcodone, guaiFENesin , LORazepam , ondansetron  **OR** ondansetron  (ZOFRAN ) IV, traZODone   Time spent: 55 minutes  Author: ELVAN SOR. MD [facility] 06/06/2024 8:46 AM  To reach On-call, see care teams to locate the attending and reach out to them via [email or web address]. If 7PM-7AM, please contact night-coverage If you still have difficulty reaching the attending provider, please page the [facility] (Director on Call) for [facility] on amion for assistance.

## 2024-06-06 NOTE — Consult Note (Signed)
 Cardiology Consultation   Patient ID: Crystal Gregory MRN: [id number]; DOB: 09-Nov-1931  Admit date: 06/05/2024 Date of Consult: 06/06/2024  PCP:  [name], NP   [facility] Providers Cardiologist:  Evalene Lunger, MD     Patient Profile: Crystal Gregory is a 89 y.o. female with a hx of vertigo, h/o nontraumatic compression fracture, RA, polyarthralgia, bronchiectasis, idiopathic pulmonary fibrosis, insomnia, GERD who is being seen 06/06/2024 for the evaluation of new onset Afib at the request of Dr. Von.  History of Present Illness: Crystal Gregory saw [facility] in 2017 and 2019 for SOB and chest pain. Echo in 2018 showed LVEF 60-65%, G1DD, mild to mod MR, mild MR. MPI in 2019 was low risk and normal study. Echo in 2021 showed LVEF 60-65%, mild to mod TR. Echo in 2022 showed LVEF 60-65%, G2DD, mild MR, mod to severe TR. Echo in 01/2022 showed LVEF 60-65%, G1DD.   The patient presented to the ER 06/04/24 with dizziness and SOB. SOB was occurring for the last 2 weeks. She also noted palpitations in the last month.she has chronic cough from COPD, but felt cough was worse over the last few days.  She denied any chest pain, fever, vomiting.  In the ER HR 104bpm, RR 26, BP 114/68, 96%O2. Labs showed WBC 10.6, Hgb 9.5, sodium 127, albumin 2.4. ddimer 5.43. Respiratory panel negative. EKG showed new onset Afib, 129bpm. CXR showed increasing opacities.  Chest CTA showed no PE, severe bibasilar pneumonia, dilation of proximal esophagus.  Negative DVT study. she was started on IV dilt, IV abx and admitted.   Past Medical History:  Diagnosis Date   Allergy    Anal fissure    Aspiration into airway    CAP (community acquired pneumonia) 08/23/2017   Chest pain    a. 09/04/2018 MV: EF >65%. No ischemia/infarct.   Coronary artery calcification seen on CT scan    Diastolic dysfunction    a. 10/2016 Echo: EF 60-65%, no rwma, Gr1 DD, mild MR, nl RV fxn, mild to mod TR, PASP .   Diverticulosis     Dyspnea    Edema of right lower extremity 01/29/2022   Fever 06/15/2023   GERD (gastroesophageal reflux disease)    Hemorrhoids    Hyperplastic colon polyp    Interstitial lung disorders (HCC)    Pulmonary fibrosis (HCC)    Septicemia (HCC) 1999   spent 4 weeks in ICU, intubated -? PNA   Vertebral fracture, pathological     Past Surgical History:  Procedure Laterality Date   ABDOMINAL HYSTERECTOMY     FLEXIBLE BRONCHOSCOPY N/A 07/05/2016   Procedure: FLEXIBLE BRONCHOSCOPY;  Surgeon: Jorie Cha, MD;  Location: [facility] ORS;  Service: Cardiopulmonary;  Laterality: N/A;   IR RADIOLOGIST EVAL & MGMT  11/05/2021     Home Medications:  Prior to Admission medications   Medication Sig Start Date End Date Taking? Authorizing Provider  amoxicillin -clavulanate (AUGMENTIN ) 875-125 MG tablet Take 1 tablet by mouth 2 (two) times daily. 05/24/24  Yes [provider]  azaTHIOprine  (IMURAN ) 50 MG tablet TAKE ONE AND ONE-HALF TABLETS (75 MG) EVERY MORNING AND 1 TABLET (50 MG) EVERY EVENING Patient taking differently: Take 50 mg by mouth daily. TAKE ONE AND ONE-HALF TABLETS (75 MG) EVERY MORNING AND 1 TABLET (50 MG) EVERY EVENING 02/27/22  Yes Deveshwar, Maya, MD  azithromycin  (ZITHROMAX ) 250 MG tablet Take 1 tablet 3x a week Patient taking differently: 250 mg. Take 1 tablet 3x a week- mon. Wed. friday 04/25/24  Yes  Tamea Dedra CROME, MD  esomeprazole  (NEXIUM ) 20 MG capsule Take 1 capsule (20 mg total) by mouth 2 (two) times daily before a meal. 03/22/24  Yes Tamea Dedra CROME, MD  gabapentin  (NEURONTIN ) 100 MG capsule Take 2 capsules (200 mg total) by mouth at bedtime. Patient taking differently: Take 100 mg by mouth at bedtime. 01/09/24 01/08/25 Yes Tamea Dedra CROME, MD  LORazepam  (ATIVAN ) 0.5 MG tablet Take 1 tablet (0.5 mg total) by mouth every 8 (eight) hours as needed (Vertigo). 05/03/24  Yes Tamea Dedra CROME, MD  montelukast  (SINGULAIR ) 10 MG tablet Take 1 tablet (10 mg total) by mouth  daily. 09/29/23  Yes Tamea Dedra CROME, MD  ondansetron  (ZOFRAN ) 4 MG tablet Take 1 tablet (4 mg total) by mouth daily as needed for nausea or vomiting. 05/03/24 05/03/25 Yes Tamea Dedra CROME, MD  propranolol  (INDERAL ) 10 MG tablet TAKE 1 TABLET TWICE A DAY FOR TREMOR 03/05/24  Yes Clark, Katherine K, NP  traZODone  (DESYREL ) 50 MG tablet Take 1 tablet (50 mg total) by mouth at bedtime as needed for sleep. For sleep. 12/31/23  Yes Clark, Katherine K, NP  denosumab  (PROLIA ) 60 MG/ML SOLN injection Inject 60 mg into the skin every 6 (six) months. Administer in upper arm, thigh, or abdomen    [provider]  fluticasone  (FLONASE ) 50 MCG/ACT nasal spray Place 2 sprays into both nostrils daily. Patient not taking: Reported on 06/05/2024 05/24/24   [provider]  guaiFENesin -codeine  100-10 MG/5ML syrup Take 5 mLs by mouth every 8 (eight) hours as needed for cough. 05/29/24   Tamea Dedra CROME, MD  Multiple Minerals-Vitamins (CALCIUM & VIT D3 BONE HEALTH PO) Take 1 Dose by mouth daily.    [provider]  predniSONE  (DELTASONE ) 1 MG tablet Take 3 mg by mouth daily with breakfast.    [provider]  vitamin B-12 (CYANOCOBALAMIN ) 500 MCG tablet Take 500 mcg by mouth daily. Patient not taking: Reported on 06/05/2024    [provider]    Scheduled Meds:  apixaban   2.5 mg Oral BID   denosumab   60 mg Subcutaneous Once   [START ON 08/04/2024] denosumab   60 mg Subcutaneous Once   gabapentin   100 mg Oral QHS   pantoprazole   40 mg Oral Daily   potassium chloride   20 mEq Oral Once   [START ON 06/07/2024] predniSONE   3 mg Oral Q breakfast   Continuous Infusions:  cefTRIAXone  (ROCEPHIN )  IV Stopped (06/06/24 1106)   diltiazem  (CARDIZEM ) infusion 5 mg/hr (06/06/24 0248)   doxycycline  (VIBRAMYCIN ) IV 100 mg (06/06/24 1116)   magnesium  sulfate bolus IVPB     PRN Meds: acetaminophen  **OR** acetaminophen , chlorpheniramine-HYDROcodone, guaiFENesin , LORazepam , ondansetron   **OR** ondansetron  (ZOFRAN ) IV, traZODone   Allergies:   No Known Allergies  Social History:   Social History   Socioeconomic History   Marital status: Widowed    Spouse name: Not on file   Number of children: 2   Years of education: Not on file   Highest education level: Not on file  Occupational History   Occupation: Retired   Tobacco Use   Smoking status: Former    Current packs/day: 0.00    Average packs/day: 0.3 packs/day for 20.0 years (5.0 ttl pk-yrs)    Types: Cigarettes    Start date: 01/16/1961    Quit date: 01/16/1981    Years since quitting: 43.4   Smokeless tobacco: Never  Vaping Use   Vaping status: Never Used  Substance and Sexual Activity   Alcohol use:  Yes    Alcohol/week: 7.0 standard drinks of alcohol    Types: 7 Standard drinks or equivalent per week   Drug use: No   Sexual activity: Not Currently  Other Topics Concern   Not on file  Social History Narrative   Married.     2 children.  Lives with her husband in [place].   Social Drivers of Corporate investment banker Strain: Low Risk  (04/28/2023)   Overall Financial Resource Strain (CARDIA)    Difficulty of Paying Living Expenses: Not hard at all  Food Insecurity: No Food Insecurity (04/28/2023)   Hunger Vital Sign    Worried About Running Out of Food in the Last Year: Never true    Ran Out of Food in the Last Year: Never true  Transportation Needs: No Transportation Needs (04/28/2023)   PRAPARE - Administrator, Civil Service (Medical): No    Lack of Transportation (Non-Medical): No  Physical Activity: Sufficiently Active (04/28/2023)   Exercise Vital Sign    Days of Exercise per Week: 4 days    Minutes of Exercise per Session: 40 min  Stress: No Stress Concern Present (04/28/2023)   [employer] of Occupational Health - Occupational Stress Questionnaire    Feeling of Stress : Not at all  Social Connections: Moderately Isolated (04/28/2023)   Social Connection and Isolation  Panel    Frequency of Communication with Friends and Family: More than three times a week    Frequency of Social Gatherings with Friends and Family: More than three times a week    Attends Religious Services: Never    Database administrator or Organizations: Yes    Attends Engineer, structural: More than 4 times per year    Marital Status: Widowed  Intimate Partner Violence: Not At Risk (04/28/2023)   Humiliation, Afraid, Rape, and Kick questionnaire    Fear of Current or Ex-Partner: No    Emotionally Abused: No    Physically Abused: No    Sexually Abused: No    Family History:    Family History  Problem Relation Age of Onset   Parkinson's disease Mother    Colon polyps Mother 20           Ulcers Father    Rheumatic fever Father    Macular degeneration Sister    Healthy Son    Healthy Daughter      ROS:  Please see the history of present illness.   All other ROS reviewed and negative.     Physical Exam/Data: Vitals:   06/06/24 1030 06/06/24 1130 06/06/24 1200 06/06/24 1221  BP: (!) 122/50 (!) 129/46 122/83 122/83  Pulse: 80 90 91 88  Resp: (!) 25 (!) 51 (!) 25 (!) 34  Temp:    97.6 F (36.4 C)  TempSrc:    Oral  SpO2: 98% 98% 97% 95%  Weight:      Height:        Intake/Output Summary (Last 24 hours) at 06/06/2024 1223 Last data filed at 06/06/2024 0050 Gross per 24 hour  Intake 2099.95 ml  Output --  Net 2099.95 ml      06/05/2024   12:54 PM 05/24/2024   10:24 AM 05/03/2024   10:07 AM  Last 3 Weights  Weight (lbs) 108 lb 102 lb 99 lb 9.6 oz  Weight (kg) 48.988 kg 46.267 kg 45.178 kg     Body mass index is 16.42 kg/m.  General:  Well nourished, well  developed, in no acute distress HEENT: normal Neck: no JVD Vascular: No carotid bruits; Distal pulses 2+ bilaterally Cardiac:  normal S1, S2; RRR; no murmur  Lungs: + rhonchi/rales  Abd: soft, nontender, no hepatomegaly  Ext: no edema Musculoskeletal:  No deformities, BUE and BLE strength normal and  equal Skin: warm and dry  Neuro:  CNs 2-12 intact, no focal abnormalities noted Psych:  Normal affect   EKG:  The EKG was personally reviewed and demonstrates:  Afib 129bpm Telemetry:  Telemetry was personally reviewed and demonstrates:  Afib>NSR around 2:30AM HR 80-90s  Relevant CV Studies:  Echo 2023 1. Left ventricular ejection fraction, by estimation, is 60 to 65%. The  left ventricle has normal function. The left ventricle has no regional  wall motion abnormalities. Left ventricular diastolic parameters are  consistent with Grade I diastolic  dysfunction (impaired relaxation). The average left ventricular global  longitudinal strain is -20.3 %. The global longitudinal strain is normal.   2. Right ventricular systolic function is normal. The right ventricular  size is normal. There is mildly elevated pulmonary artery systolic  pressure. The estimated right ventricular systolic pressure is 37.8 mmHg.   3. Right atrial size was mildly dilated.   4. The mitral valve is normal in structure. No evidence of mitral valve  regurgitation.   5. The aortic valve is tricuspid. Aortic valve regurgitation is not  visualized.   6. The inferior vena cava is normal in size with <50% respiratory  variability, suggesting right atrial pressure of 8 mmHg.    Echo 2022 1. Left ventricular ejection fraction, by estimation, is 60 to 65%. The  left ventricle has normal function. The left ventricle has no regional  wall motion abnormalities. Left ventricular diastolic parameters are  consistent with Grade II diastolic  dysfunction (pseudonormalization).   2. Right ventricular systolic function is normal. The right ventricular  size is normal. There is mildly elevated pulmonary artery systolic  pressure. The estimated right ventricular systolic pressure is 40.9 mmHg.   3. Left atrial size was mildly dilated.   4. The mitral valve is normal in structure. Mild mitral valve  regurgitation. No evidence  of mitral stenosis.   5. Tricuspid valve regurgitation is moderate to severe.   6. The aortic valve is normal in structure.Trileaflet. Aortic valve  regurgitation is not visualized. Mild aortic valve sclerosis is present,  with no evidence of aortic valve stenosis.   7. The inferior vena cava is normal in size with <50% respiratory  variability, suggesting right atrial pressure of 8 mmHg.   Echo 2021  1. Left ventricular ejection fraction, by estimation, is 60 to 65%. The  left ventricle has normal function. The left ventricle has no regional  wall motion abnormalities. Left ventricular diastolic parameters were  normal.   2. Right ventricular systolic function is normal. The right ventricular  size is normal. There is mildly elevated pulmonary artery systolic  pressure. The estimated right ventricular systolic pressure is 39.4 mmHg.   3. Left atrial size was mildly dilated.   4. Tricuspid valve regurgitation is mild to moderate.   MPI 09/2018  Narrative & Impression  There was no ST segment deviation noted during stress. The study is normal. This is a low risk study. The left ventricular ejection fraction is hyperdynamic (>65%).   Echo 10/2016 Left ventricle: The cavity size was normal. Systolic function was    normal. The estimated ejection fraction was in the range of 60%    to 65%.  Wall motion was normal; there were no regional wall    motion abnormalities. Doppler parameters are consistent with    abnormal left ventricular relaxation (grade 1 diastolic    dysfunction).  - Mitral valve: There was mild regurgitation.  - Left atrium: The atrium was normal in size.  - Right ventricle: Systolic function was normal.  - Tricuspid valve: There was mild-moderate regurgitation.  - Pulmonary arteries: Systolic pressure was midlly elevated. PA    peak pressure: 35 mm Hg (S).  - Inferior vena cava: The vessel was normal in size. The    respirophasic diameter changes were in the normal  range (>= 50%),    consistent with normal central venous pressure.   Laboratory Data: High Sensitivity Troponin:   Recent Labs  Lab 06/05/24 1258  TROPONINIHS 15     Chemistry Recent Labs  Lab 06/05/24 1258 06/06/24 0517 06/06/24 0617  NA 127* 132*  --   K 4.1 3.7  --   CL 93* 98  --   CO2 23 27  --   GLUCOSE 109* 150*  --   BUN 14 13  --   CREATININE 0.44 0.50  --   CALCIUM 7.8* 7.8*  --   MG 1.8  --  1.9  GFRNONAA >60 >60  --   ANIONGAP 11 7  --     Recent Labs  Lab 06/05/24 1258  PROT 6.4*  ALBUMIN 2.4*  AST 23  ALT 17  ALKPHOS 110  BILITOT 0.9   Lipids No results for input(s): CHOL, TRIG, HDL, LABVLDL, LDLCALC, CHOLHDL in the last 168 hours.  Hematology Recent Labs  Lab 06/05/24 1258 06/06/24 0517  WBC 10.6* 8.5  RBC 2.75* 2.63*  HGB 9.5* 9.0*  HCT 28.1* 27.2*  MCV 102.2* 103.4*  MCH 34.5* 34.2*  MCHC 33.8 33.1  RDW 14.9 15.1  PLT 356 319   Thyroid  No results for input(s): TSH, FREET4 in the last 168 hours.  BNP Recent Labs  Lab 06/05/24 1258  BNP 383.8*    DDimer  Recent Labs  Lab 06/05/24 1258  DDIMER 5.43*    Radiology/Studies:  US  Venous Img Lower Bilateral Result Date: 06/05/2024 CLINICAL DATA:  elevated d-dimer EXAM: BILATERAL LOWER EXTREMITY VENOUS DOPPLER ULTRASOUND TECHNIQUE: Gray-scale sonography with graded compression, as well as color Doppler and duplex ultrasound were performed to evaluate the lower extremity deep venous systems from the level of the common femoral vein and including the common femoral, femoral, profunda femoral, popliteal and calf veins including the posterior tibial, peroneal and gastrocnemius veins when visible. The superficial great saphenous vein was also interrogated. Spectral Doppler was utilized to evaluate flow at rest and with distal augmentation maneuvers in the common femoral, femoral and popliteal veins. COMPARISON:  January 12, 2022 FINDINGS: RIGHT LOWER EXTREMITY Common Femoral Vein:  No evidence of thrombus. Normal compressibility, respiratory phasicity and response to augmentation. Saphenofemoral Junction: No evidence of thrombus. Normal compressibility and flow on color Doppler imaging. Profunda Femoral Vein: No evidence of thrombus. Normal compressibility and flow on color Doppler imaging. Femoral Vein: No evidence of thrombus. Normal compressibility, respiratory phasicity and response to augmentation. Popliteal Vein: No evidence of thrombus. Normal compressibility, respiratory phasicity and response to augmentation. Calf Veins: No evidence of thrombus. Normal compressibility and flow on color Doppler imaging. Superficial Great Saphenous Vein: No evidence of thrombus. Normal compressibility. Other Findings:  The small saphenous vein is thrombosed. LEFT LOWER EXTREMITY Common Femoral Vein: No evidence of thrombus. Normal compressibility, respiratory phasicity and  response to augmentation. Saphenofemoral Junction: No evidence of thrombus. Normal compressibility and flow on color Doppler imaging. Profunda Femoral Vein: No evidence of thrombus. Normal compressibility and flow on color Doppler imaging. Femoral Vein: No evidence of thrombus. Normal compressibility, respiratory phasicity and response to augmentation. Popliteal Vein: No evidence of thrombus. Normal compressibility, respiratory phasicity and response to augmentation. Calf Veins: No evidence of thrombus. Normal compressibility and flow on color Doppler imaging. Superficial Great Saphenous Vein: No evidence of thrombus. Normal compressibility. Other Findings: Likely popliteal cysts bilaterally, measuring up to 3.5 cm on the right. IMPRESSION: 1. Negative for deep venous thrombosis within both legs. 2. Positive for superficial venous thrombosis in the small saphenous vein on the right. Electronically Signed   By: Rogelia Myers M.D.   On: 06/05/2024 18:32   CT Angio Chest PE W and/or Wo Contrast Result Date: 06/05/2024 CLINICAL DATA:   Elevated D-dimer.  Concern for pulmonary embolism EXAM: CT ANGIOGRAPHY CHEST WITH CONTRAST TECHNIQUE: Multidetector CT imaging of the chest was performed using the standard protocol during bolus administration of intravenous contrast. Multiplanar CT image reconstructions and MIPs were obtained to evaluate the vascular anatomy. RADIATION DOSE REDUCTION: This exam was performed according to the departmental dose-optimization program which includes automated exposure control, adjustment of the mA and/or kV according to patient size and/or use of iterative reconstruction technique. CONTRAST:  75mL OMNIPAQUE  IOHEXOL  350 MG/ML SOLN COMPARISON:  Chest CT 05/05/2021 FINDINGS: Cardiovascular: No filling defects within the pulmonary arteries to suggest acute pulmonary embolism. Mediastinum/Nodes: Marked dilatation the proximal esophagus. The esophagus above the carina measures 5.4 cm in diameter. There is gas filled esophagus and GE junction. The more distal esophagus is normal caliber at 1.2 cm. No fluid esophagus. No food stuff esophagus. Esophageal dilatation is similar tube of progressed from CT 2022. No mediastinal adenopathy.  Trachea normal Lungs/Pleura: There is dense bibasilar airspace disease airspace disease extends into the lingula and RIGHT middle lobe. Six dense of bronchiectasis in the lower lobes. Upper Abdomen: Limited view of the liver, kidneys, pancreas are unremarkable. Normal adrenal glands. Musculoskeletal: Very little body fat. No aggressive osseous lesion compression fracture of the lower thoracic spine appears chronic. No acute fracture Review of the MIP images confirms the above findings. IMPRESSION: 1. No evidence acute pulmonary embolism. 2. Dense bibasilar airspace disease with bronchiectasis. Findings concerning severe bibasilar pneumonia. 3. Marked dilatation of the proximal esophagus. No fluid in the esophagus. No evidence of esophageal obstruction. 4. Very little body fat. Electronically  Signed   By: Jackquline Boxer M.D.   On: 06/05/2024 17:01   DG Chest 1 View Result Date: 06/05/2024 CLINICAL DATA:  Dizziness, productive cough, COPD; evaluate for pleural edema EXAM: CHEST  1 VIEW COMPARISON:  November 04, 2023 May 05, 2021 FINDINGS: Biapical pleuroparenchymal scarring. Chronic changes of interstitial lung disease again noted. Increasing patchy airspace opacities in the right mid and left lower lung zones. No pneumothorax. Chronic blunting of the right costophrenic sulcus, unchanged. No cardiomegaly. Aortic atherosclerosis. No acute fracture or destructive lesions. Multilevel thoracic osteophytosis. Osteopenia. IMPRESSION: Underlying changes of chronic interstitial lung disease again noted. Increasing patchy airspace opacities in the right mid and left lower lung zones, worrisome for superimposed bronchopneumonia. Electronically Signed   By: Rogelia Myers M.D.   On: 06/05/2024 14:24     Assessment and Plan:  New onset Afib - the patient presented with dizziness, palpitations and cough found to have new onset Afib and PNA started on IV diltiazem  with conversion to normal  sinus rhythm - CHA2DS2-VASc of 3 (age, gender) patient was started on Eliquis  2.5 mg twice daily - Patient remains in normal sinus rhythm - Can convert IV Cardizem  to oral  Multilobar PNA - Antibiotics per IM  Hyponatremia - Status post sodium chloride  500 mL liter bolus per ED - On admission LR 1L bolus one-time  IDF - She is followed by pulmonology as outpatient     For questions or updates, please contact [facility] Please consult [email or web address] for contact info under    Signed, Nautia Lem VEAR Fishman, PA-C  06/06/2024 12:23 PM

## 2024-06-06 NOTE — ED Notes (Signed)
 Pt ambulated to restroom about 108ft with walker, standby assist from this RN and pt's daughter.

## 2024-06-06 NOTE — Progress Notes (Signed)
 PT Cancellation Note  Patient Details Name: Crystal Gregory MRN: [id number] DOB: Mar 22, 1932   Cancelled Treatment:    Reason Eval/Treat Not Completed: Patient declined, no reason specified. Orders received and chart reviewed. Upon entry pt reports not feeling well, being frustrated being in the ED still. Stating she doesn't need PT. Education provided on PT/OT role during hospitalization. Pt understanding, is agreeable to participate tomorrow hoping she is feeling better. Declines PT/OT re-attempting this afternoon even if pt is admitted to a room. OT informed. PT to re-attempt at a later time/date.    Dorina HERO. Fairly IV, PT, DPT Physical Therapist-   [facility] 06/06/2024, 11:58 AM

## 2024-06-06 NOTE — ED Notes (Signed)
 Pt sitting up to bedside eating breakfast. Daughter remains at bedside.

## 2024-06-07 ENCOUNTER — Other Ambulatory Visit: Payer: Self-pay

## 2024-06-07 ENCOUNTER — Inpatient Hospital Stay

## 2024-06-07 DIAGNOSIS — J189 Pneumonia, unspecified organism: Secondary | ICD-10-CM | POA: Diagnosis not present

## 2024-06-07 DIAGNOSIS — J9601 Acute respiratory failure with hypoxia: Secondary | ICD-10-CM | POA: Diagnosis not present

## 2024-06-07 DIAGNOSIS — J441 Chronic obstructive pulmonary disease with (acute) exacerbation: Secondary | ICD-10-CM

## 2024-06-07 DIAGNOSIS — E871 Hypo-osmolality and hyponatremia: Secondary | ICD-10-CM | POA: Diagnosis not present

## 2024-06-07 DIAGNOSIS — J84112 Idiopathic pulmonary fibrosis: Secondary | ICD-10-CM

## 2024-06-07 DIAGNOSIS — J9602 Acute respiratory failure with hypercapnia: Secondary | ICD-10-CM

## 2024-06-07 DIAGNOSIS — I4891 Unspecified atrial fibrillation: Secondary | ICD-10-CM | POA: Diagnosis not present

## 2024-06-07 DIAGNOSIS — E43 Unspecified severe protein-calorie malnutrition: Secondary | ICD-10-CM | POA: Insufficient documentation

## 2024-06-07 DIAGNOSIS — R42 Dizziness and giddiness: Secondary | ICD-10-CM | POA: Diagnosis not present

## 2024-06-07 LAB — BLOOD GAS, VENOUS
Acid-Base Excess: 2.2 mmol/L — ABNORMAL HIGH (ref 0.0–2.0)
Bicarbonate: 27.3 mmol/L (ref 20.0–28.0)
O2 Saturation: 94.6 %
Patient temperature: 37
pCO2, Ven: 43 mmHg — ABNORMAL LOW (ref 44–60)
pH, Ven: 7.41 (ref 7.25–7.43)
pO2, Ven: 66 mmHg — ABNORMAL HIGH (ref 32–45)

## 2024-06-07 LAB — CBC
HCT: 29.1 % — ABNORMAL LOW (ref 36.0–46.0)
Hemoglobin: 9.8 g/dL — ABNORMAL LOW (ref 12.0–15.0)
MCH: 34.4 pg — ABNORMAL HIGH (ref 26.0–34.0)
MCHC: 33.7 g/dL (ref 30.0–36.0)
MCV: 102.1 fL — ABNORMAL HIGH (ref 80.0–100.0)
Platelets: 465 K/uL — ABNORMAL HIGH (ref 150–400)
RBC: 2.85 MIL/uL — ABNORMAL LOW (ref 3.87–5.11)
RDW: 14.9 % (ref 11.5–15.5)
WBC: 12.7 K/uL — ABNORMAL HIGH (ref 4.0–10.5)
nRBC: 0 % (ref 0.0–0.2)

## 2024-06-07 LAB — BASIC METABOLIC PANEL WITH GFR
Anion gap: 10 (ref 5–15)
BUN: 10 mg/dL (ref 8–23)
CO2: 25 mmol/L (ref 22–32)
Calcium: 7.8 mg/dL — ABNORMAL LOW (ref 8.9–10.3)
Chloride: 94 mmol/L — ABNORMAL LOW (ref 98–111)
Creatinine, Ser: 0.71 mg/dL (ref 0.44–1.00)
GFR, Estimated: >60 mL/min (ref >60)
Glucose, Bld: 119 mg/dL — ABNORMAL HIGH (ref 70–99)
Potassium: 4.1 mmol/L (ref 3.5–5.1)
Sodium: 129 mmol/L — ABNORMAL LOW (ref 135–145)

## 2024-06-07 LAB — C-REACTIVE PROTEIN: CRP: 13.9 mg/dL — ABNORMAL HIGH (ref <1.0)

## 2024-06-07 LAB — SEDIMENTATION RATE: Sed Rate: 127 mm/h — ABNORMAL HIGH (ref 0–30)

## 2024-06-07 LAB — MAGNESIUM: Magnesium: 2 mg/dL (ref 1.7–2.4)

## 2024-06-07 LAB — GLUCOSE, CAPILLARY: Glucose-Capillary: 118 mg/dL — ABNORMAL HIGH (ref 70–99)

## 2024-06-07 LAB — PROCALCITONIN: Procalcitonin: 0.16 ng/mL

## 2024-06-07 LAB — TROPONIN I (HIGH SENSITIVITY): Troponin I (High Sensitivity): 18 ng/L — ABNORMAL HIGH (ref <18)

## 2024-06-07 LAB — PHOSPHORUS: Phosphorus: 2.4 mg/dL — ABNORMAL LOW (ref 2.5–4.6)

## 2024-06-07 MED ORDER — SODIUM CHLORIDE 0.9 % IV SOLN
2.0000 g | Freq: Two times a day (BID) | INTRAVENOUS | Status: AC
  Administered 2024-06-07 – 2024-06-11 (×9): 2 g via INTRAVENOUS
  Filled 2024-06-07 (×10): qty 12.5

## 2024-06-07 MED ORDER — DOXYCYCLINE HYCLATE 100 MG PO TABS
100.0000 mg | ORAL_TABLET | Freq: Two times a day (BID) | ORAL | Status: DC
  Administered 2024-06-07: 100 mg via ORAL
  Filled 2024-06-07 (×2): qty 1

## 2024-06-07 MED ORDER — METHYLPREDNISOLONE SODIUM SUCC 40 MG IJ SOLR: 40.0000 mg | INTRAMUSCULAR | Status: DC

## 2024-06-07 MED ORDER — IPRATROPIUM-ALBUTEROL 0.5-2.5 (3) MG/3ML IN SOLN
3.0000 mL | Freq: Four times a day (QID) | RESPIRATORY_TRACT | Status: DC
  Filled 2024-06-07 (×2): qty 3

## 2024-06-07 MED ORDER — DILTIAZEM HCL 30 MG PO TABS: 60.0000 mg | ORAL_TABLET | Freq: Four times a day (QID) | ORAL | Status: DC

## 2024-06-07 MED ORDER — SODIUM CHLORIDE 0.9 % IV SOLN
100.0000 mg | Freq: Two times a day (BID) | INTRAVENOUS | Status: AC
  Administered 2024-06-07 – 2024-06-09 (×4): 100 mg via INTRAVENOUS
  Filled 2024-06-07 (×4): qty 100

## 2024-06-07 MED ORDER — FUROSEMIDE 10 MG/ML IJ SOLN
20.0000 mg | Freq: Once | INTRAMUSCULAR | Status: AC
  Administered 2024-06-07: 20 mg via INTRAVENOUS
  Filled 2024-06-07: qty 2

## 2024-06-07 MED ORDER — BUDESONIDE 0.5 MG/2ML IN SUSP
0.5000 mg | Freq: Two times a day (BID) | RESPIRATORY_TRACT | Status: DC
  Administered 2024-06-07 – 2024-06-12 (×10): 0.5 mg via RESPIRATORY_TRACT
  Filled 2024-06-07 (×10): qty 2

## 2024-06-07 MED ORDER — HYDROMORPHONE HCL 1 MG/ML IJ SOLN
1.0000 mg | Freq: Once | INTRAMUSCULAR | Status: AC
  Administered 2024-06-07: 1 mg via INTRAVENOUS
  Filled 2024-06-07: qty 1

## 2024-06-07 MED ORDER — SCOPOLAMINE 1 MG/3DAYS TD PT72
1.0000 | MEDICATED_PATCH | TRANSDERMAL | Status: DC
  Administered 2024-06-07 – 2024-06-10 (×2): 1 mg via TRANSDERMAL
  Filled 2024-06-07 (×2): qty 1

## 2024-06-07 MED ORDER — MORPHINE SULFATE (PF) 2 MG/ML IV SOLN
1.0000 mg | INTRAVENOUS | Status: DC | PRN
  Administered 2024-06-07 – 2024-06-09 (×4): 1 mg via INTRAVENOUS
  Filled 2024-06-07 (×4): qty 1

## 2024-06-07 MED ORDER — IPRATROPIUM-ALBUTEROL 0.5-2.5 (3) MG/3ML IN SOLN
3.0000 mL | Freq: Three times a day (TID) | RESPIRATORY_TRACT | Status: DC
  Administered 2024-06-08 – 2024-06-10 (×7): 3 mL via RESPIRATORY_TRACT
  Filled 2024-06-07 (×8): qty 3

## 2024-06-07 MED ORDER — LINEZOLID 600 MG/300ML IV SOLN
600.0000 mg | Freq: Two times a day (BID) | INTRAVENOUS | Status: DC
  Administered 2024-06-07 – 2024-06-08 (×2): 600 mg via INTRAVENOUS
  Filled 2024-06-07 (×4): qty 300

## 2024-06-07 MED ORDER — DILTIAZEM HCL-DEXTROSE 125-5 MG/125ML-% IV SOLN (PREMIX)
5.0000 mg/h | INTRAVENOUS | Status: DC
  Administered 2024-06-07: 7.5 mg/h via INTRAVENOUS
  Filled 2024-06-07: qty 125

## 2024-06-07 MED ORDER — FUROSEMIDE 10 MG/ML IJ SOLN
40.0000 mg | Freq: Once | INTRAMUSCULAR | Status: AC
  Administered 2024-06-07: 40 mg via INTRAVENOUS
  Filled 2024-06-07: qty 4

## 2024-06-07 MED ORDER — OXYCODONE HCL 5 MG PO TABS
5.0000 mg | ORAL_TABLET | Freq: Four times a day (QID) | ORAL | Status: DC | PRN
  Administered 2024-06-07 – 2024-06-12 (×5): 5 mg via ORAL
  Filled 2024-06-07 (×5): qty 1

## 2024-06-07 MED ORDER — IOHEXOL 350 MG/ML SOLN
75.0000 mL | Freq: Once | INTRAVENOUS | Status: AC | PRN
Start: 2024-06-07 — End: 2024-06-07
  Administered 2024-06-07: 75 mL via INTRAVENOUS

## 2024-06-07 MED ORDER — ADULT MULTIVITAMIN W/MINERALS CH
1.0000 | ORAL_TABLET | Freq: Every day | ORAL | Status: DC
  Administered 2024-06-07 – 2024-06-10 (×3): 1 via ORAL
  Filled 2024-06-07 (×4): qty 1

## 2024-06-07 MED ORDER — AMIODARONE LOAD VIA INFUSION
150.0000 mg | Freq: Once | INTRAVENOUS | Status: AC
  Administered 2024-06-07: 150 mg via INTRAVENOUS
  Filled 2024-06-07: qty 83.34

## 2024-06-07 MED ORDER — METHYLPREDNISOLONE SODIUM SUCC 40 MG IJ SOLR: 40.0000 mg | Freq: Two times a day (BID) | INTRAMUSCULAR | Status: DC

## 2024-06-07 MED ORDER — PROCHLORPERAZINE EDISYLATE 10 MG/2ML IJ SOLN
10.0000 mg | Freq: Once | INTRAMUSCULAR | Status: AC
  Administered 2024-06-07: 10 mg via INTRAVENOUS
  Filled 2024-06-07 (×2): qty 2

## 2024-06-07 MED ORDER — AMIODARONE HCL IN DEXTROSE 360-4.14 MG/200ML-% IV SOLN
60.0000 mg/h | INTRAVENOUS | Status: AC
  Administered 2024-06-07 (×2): 60 mg/h via INTRAVENOUS
  Filled 2024-06-07 (×2): qty 200

## 2024-06-07 MED ORDER — METHYLPREDNISOLONE SODIUM SUCC 40 MG IJ SOLR
20.0000 mg | Freq: Two times a day (BID) | INTRAMUSCULAR | Status: AC
  Administered 2024-06-07 – 2024-06-08 (×3): 20 mg via INTRAVENOUS
  Filled 2024-06-07 (×3): qty 1

## 2024-06-07 MED ORDER — AMIODARONE HCL IN DEXTROSE 360-4.14 MG/200ML-% IV SOLN
30.0000 mg/h | INTRAVENOUS | Status: DC
  Administered 2024-06-07 – 2024-06-08 (×3): 30 mg/h via INTRAVENOUS
  Filled 2024-06-07 (×3): qty 200

## 2024-06-07 MED ORDER — IPRATROPIUM-ALBUTEROL 0.5-2.5 (3) MG/3ML IN SOLN
3.0000 mL | RESPIRATORY_TRACT | Status: DC
  Administered 2024-06-07: 3 mL via RESPIRATORY_TRACT
  Filled 2024-06-07: qty 3

## 2024-06-07 NOTE — Plan of Care (Addendum)
 Patient has had a rough morning.  Complaining of diff breathing, chest heavy, increased RR but SPO2 stable.  Decreased appetite and verbally requesting to go home.  When asked to clarify the word home - she said, to leave this world.  Im so tired of being sick, weak and tired.  Everything hurts.  I lost my husband 2 yrs ago and miss him so much!  Daughter was at bedside during this conversation.  Palliative order suggested - educated on their purpose and hopes to provide choices if further medical deterioration continues.  The patient mentioned hospice and stated they were called in for my husband.    A new IV had to be placed, for IV anbx (placed in 1 attempt), but patient verbalized extreme pain (below the catheter).  No s/sx of infiltration or pain at the catheter or above.  - Still, had me stop IV anbx and Dr. agreed to change to PO instead.  Stat labs placed  - but patient refused to be stuck again.  Hosp informed and orders placed.  1200 SPO2 dropped to 70s.on 2L, patient diaphoretic, complaining of dizzy amd nauseous.  Progressed to 6L, then HF10L, 12L and 15L.  Ultimately, needed NRB 15L but still 85% - Rapid response called.  STAT CT (r/o PE) and labs ordered. Resp placed on NRB and HF 15L.  About 1240 was able to remove NRB and HF down to 12L. Dilt dtt increased to 10/hr. Lasix  and neb treatment given. Palliative rounded s/w patient and family.  ICU chrg and intensivist informed to monitor situation.  Family at bedside and comforted by chaplain. Family agreed to change code status to DNR/DNI meds only.   1620  Patient sats fell to 78% on HF12L.  Increased to HF15L. Informed Resp and asked to place NRB also.  Informed Dr and care team patient now on HF15L and NRB to stay at 90%. Family at bedside.  Cardio rounded and placed orders for Amio drip. To be added with existing Dilt drip.  HR sustaining in 120-mid 130s. IV team informed to place PICC line when able.

## 2024-06-07 NOTE — Consult Note (Signed)
 NAME:  Crystal Gregory, MRN:  [id number], DOB:  Aug 23, 1932, LOS: 2 ADMISSION DATE:  06/05/2024  CHIEF COMPLAINT:  SOB     History of Present Illness:  89 year old female with history of interstitial lung disease, vertigo, history of nontraumatic compression fracture, rheumatoid arthritis, polyarthralgia, bronchiectasis, idiopathic pulmonary fibrosis, insomnia, GERD, who presents emergency department for chief concerns of dizziness, productive cough for about 2 weeks.   Vitals in the ED showed t of 98.5, rr 18, hr 98, blood pressure 114/68, SpO2 96% on room air.   Serum sodium is 127, potassium 4.1, chloride 93, bicarb 23, BUN 14, serum creatinine 0.44, EGFR greater than 60, nonfasting blood glucose 109, WBC 10.6, hemoglobin 9.5, platelets of 356.   BNP is 383.8, hs troponin is 15, Pro-Cal is 0.16, lactic acid 1.2, D-dimer 5.43, COVID/influenza A/influenza B/RSV PCR were negative.   ED treatment: Acetaminophen  1000 mg p.o. one-time dose, diltiazem  bolus and gtt., ceftriaxone  1 g IV one-time dose, doxycycline  100 mg IV, sodium chloride  500 mL bolus.   PCCM consulted Rapid response called for severe hypoxia Patient SOB and wheezing with crackles, +NV Family at bedside Patient on Immunosuppressive therapy for RA  Significant Hospital Events: Including procedures, antibiotic start and stop dates in addition to other pertinent events   9/2 admitted for pneumonia, complicated by afib with RVR, IPF and COPD exacerbation 9/4 worsening hypoxia     Antimicrobials:   Antibiotics Given (last 72 hours)     Date/Time Action Medication Dose Rate   06/05/24 1604 New Bag/Given   cefTRIAXone  (ROCEPHIN ) 1 g in sodium chloride  0.9 % 100 mL IVPB 1 g 200 mL/hr   06/05/24 1652 New Bag/Given   doxycycline  (VIBRAMYCIN ) 100 mg in sodium chloride  0.9 % 250 mL IVPB 100 mg 125 mL/hr   06/05/24 2247 New Bag/Given   doxycycline  (VIBRAMYCIN ) 100 mg in sodium chloride  0.9 % 250 mL IVPB 100 mg 125 mL/hr    06/06/24 1016 New Bag/Given   cefTRIAXone  (ROCEPHIN ) 2 g in sodium chloride  0.9 % 100 mL IVPB 2 g 200 mL/hr   06/06/24 1116 New Bag/Given   doxycycline  (VIBRAMYCIN ) 100 mg in sodium chloride  0.9 % 250 mL IVPB 100 mg 125 mL/hr   06/06/24 2231 New Bag/Given   doxycycline  (VIBRAMYCIN ) 100 mg in sodium chloride  0.9 % 250 mL IVPB 100 mg 125 mL/hr   06/07/24 1034 Given   doxycycline  (VIBRA -TABS) tablet 100 mg 100 mg             Interim History / Subjective:  Patient is distress, palliative at bedside SOB and WOB, NV Doesn't feel well  Objective   Blood pressure (!) 148/67, pulse 91, temperature 98.8 F (37.1 C), resp. rate 20, height 5' 8 (1.727 m), weight 49 kg, SpO2 (!) 83%.        Intake/Output Summary (Last 24 hours) at 06/07/2024 1324 Last data filed at 06/07/2024 0300 Gross per 24 hour  Intake 700.45 ml  Output --  Net 700.45 ml   Filed Weights   06/05/24 1254  Weight: 49 kg     Review of Systems: +SOB +wheezing +cough +NV  Other:  All other systems negative  PHYSICAL EXAMINATION:  GENERAL:critically ill appearing, +resp distress EYES: Pupils equal, round, reactive to light.  No scleral icterus.  MOUTH: Moist mucosal membrane.  NECK: Supple.  PULMONARY: Lungs clear to auscultation, +rhonchi, +wheezing CARDIOVASCULAR: S1 and S2.  Regular rate and rhythm GASTROINTESTINAL: Soft, nontender, -distended. Positive bowel sounds.  MUSCULOSKELETAL: No swelling, clubbing, or edema.  NEUROLOGIC: alert and awake SKIN:normal, warm to touch, Capillary refill delayed  Pulses present bilaterally   Labs/imaging that I havepersonally reviewed  (right click and Reselect all SmartList Selections daily)       ASSESSMENT AND PLAN SYNOPSIS  89 yo with Severe ACUTE Hypoxic  Failure with IPF/COPD exacerbation due to pneumonia in state immunosuppression complicated by afib with RVR  Severe ACUTE Hypoxic and Hypercapnic Respiratory Failure -Wean Fio2  - Intermittent  chest x-ray & ABG PRN - Ensure adequate pulmonary hygiene  [place] high flow [place], can use heated high flow [place]    SEVERE COPD EXACERBATION -continue IV steroids as prescribed -continue NEB THERAPY as prescribed -morphine  as needed -wean fio2 as needed and tolerated   CARDIAC -acute afib with RVR -oxygen  as needed -Lasix  as tolerated -follow up cardiac enzymes as indicated Follow up cardiology recs   CARDIAC ICU monitoring   INFECTIOUS DISEASE -continue antibiotics as prescribed -follow up cultures Cover MRSA and pseudomonas  ENDO - ICU hypoglycemic\Hyperglycemia protocol -check FSBS per protocol   GI GI PROPHYLAXIS as indicated  NUTRITIONAL STATUS DIET-->NPO Constipation protocol as indicated   ELECTROLYTES -follow labs as needed -replace as needed -pharmacy consultation and following     Labs   CBC: Recent Labs  Lab 06/05/24 1258 06/06/24 0517  WBC 10.6* 8.5  NEUTROABS 9.4*  --   HGB 9.5* 9.0*  HCT 28.1* 27.2*  MCV 102.2* 103.4*  PLT 356 319    Basic Metabolic Panel: Recent Labs  Lab 06/05/24 1258 06/06/24 0517 06/06/24 0617  NA 127* 132*  --   K 4.1 3.7  --   CL 93* 98  --   CO2 23 27  --   GLUCOSE 109* 150*  --   BUN 14 13  --   CREATININE 0.44 0.50  --   CALCIUM 7.8* 7.8*  --   MG 1.8  --  1.9  PHOS  --   --  2.3*   GFR: Estimated Creatinine Clearance: 36.2 mL/min (by C-G formula based on SCr of 0.5 mg/dL). Recent Labs  Lab 06/05/24 1258 06/05/24 1544 06/06/24 0517  PROCALCITON 0.16  --   --   WBC 10.6*  --  8.5  LATICACIDVEN  --  1.2  --     Liver Function Tests: Recent Labs  Lab 06/05/24 1258  AST 23  ALT 17  ALKPHOS 110  BILITOT 0.9  PROT 6.4*  ALBUMIN 2.4*   No results for input(s): LIPASE, AMYLASE in the last 168 hours. No results for input(s): AMMONIA in the last 168 hours.  ABG    Component Value Date/Time   HCO3 27.3 06/07/2024 1241   O2SAT 94.6 06/07/2024 1241     Coagulation Profile: No  results for input(s): INR, PROTIME in the last 168 hours.  Cardiac Enzymes: No results for input(s): CKTOTAL, CKMB, CKMBINDEX, TROPONINI in the last 168 hours.  HbA1C: No results found for: HGBA1C  CBG: Recent Labs  Lab 06/07/24 1214  GLUCAP 118*    Allergies No Known Allergies        Critical Care Time devoted to patient care services described in this note is 75 minutes.  Critical care was necessary to treat or prevent imminent or life-threatening deterioration.   Patient with Multiorgan failure and at high risk for cardiac arrest and death.    Nickolas Alm Cellar, M.D.  [facility]  Medical Director [facility] Medical Director [facility] Cardio-Pulmonary Department

## 2024-06-07 NOTE — Significant Event (Signed)
 Rapid Response Event Note   Reason for Call :  hypoxia  Initial Focused Assessment:  Rapid response RN arrived in patient's room with patient in bed surrounded by 2A staff. Patient on 15 L NRB mask with oxygen  saturations 85% though they came up to 95% by end of assessment. Patient reporting 10/10 generalized pain, difficulty breathing, dizziness, and nausea. Recently received dilaudid  for pain medication. RR 32 mildly labored. BP 149/59 MAP 82. HR 105-120 afib. CBG 118. Lungs clear left upper, coarse crackles right side and left lower.   Interventions:  Patient titrated down to 15 L HFNC. Oxygen  saturations 93% on HFNC.  Plan of Care:  Dr. Von at bedside assessing patient, reviewing patient's chart, and making adjustments as needed. Palliative care NP Lamarr Gunner at bedside discussing patient's wishes with patient and plan of care with Dr. Von.  Event Summary:   MD Notified: Dr. Von Call Time: 12:12 Arrival Time: 12:13 End Time: 12:26  GEORGINA LAURAINE NORRIS, RN

## 2024-06-07 NOTE — Progress Notes (Signed)
 [facility] Progress Note  Patient: Crystal Gregory    FMW:[id number]  DOA: 06/05/2024     Date of Service: the patient was seen and examined on 06/07/2024  Chief Complaint  Patient presents with   Dizziness   Brief hospital course: Crystal Gregory is a 89 year old female with history of interstitial lung disease, vertigo, history of nontraumatic compression fracture, rheumatoid arthritis, polyarthralgia, bronchiectasis, idiopathic pulmonary fibrosis, insomnia, GERD, who presents emergency department for chief concerns of dizziness, productive cough for about 2 weeks.   Vitals in the ED showed t of 98.5, rr 18, hr 98, blood pressure 114/68, SpO2 96% on room air.   Serum sodium is 127, potassium 4.1, chloride 93, bicarb 23, BUN 14, serum creatinine 0.44, EGFR greater than 60, nonfasting blood glucose 109, WBC 10.6, hemoglobin 9.5, platelets of 356.   BNP is 383.8, hs troponin is 15, Pro-Cal is 0.16, lactic acid 1.2, D-dimer 5.43, COVID/influenza A/influenza B/RSV PCR were negative.   ED treatment: Acetaminophen  1000 mg p.o. one-time dose, diltiazem  bolus and gtt., ceftriaxone  1 g IV one-time dose, doxycycline  100 mg IV, sodium chloride  500 mL bolus.   Assessment and Plan:  # Acute hypoxic respiratory failure secondary to pneumonia. # Multilobar lung infiltrate Bibasilar Community-acquired pneumonia Continue with doxycycline  100 mg twice daily S/p Ceftriaxone  2 g IV daily Mucinex  600 mg p.o. twice daily, Tussionex prn 9/4 started cefepime  due to worsening of pneumonia. Continue supplemental O2 halogen, currently patient HFNC and she may need Non Breather vs BiPAP. Intensivist consulted, Dr Isaiah, goals of care discussed and patient wanted to be DNR/DNI. Patient was started on cefepime , Zyvox , steroids and breathing treatments as per pulm consult. Palliative care was involved, patient opted for DNR/DNI but continue current treatment. Overall prognosis is poor, patient may not  survive.  Family is aware. Lasix  IV 40 mg given, another dose IV Lasix  40 mg at 8 PM order placed Check renal functions tomorrow a.m. and hydration status.    # Atrial fibrillation with RVR (HCC) S/p Diltiazem  10 mg IV one-time dose Continue diltiazem  gtt. CHA2DS2VASc = 3 (age > 54, gender: Female) Discussed risks/benefits of anticoagulation with atrial fibrillation, including GI bleed, intracranial bleeding especially with trauma, increased bruising.  Patient advised to let PCP/provider know should she develop the symptoms. Patient endorses understanding of these risks and states she would like to be initiated on anticoagulation Eliquis  2.5 mg p.o. twice daily initiated [facility] cardiology 9/4 started amiodarone  IV infusion  # Isotonic hyponatremia S/p Nacl IVF 500 mL liter bolus per EDP On admission LR 1 L bolus x 1 given  Serum osmolality 278 within normal range Started fluid striction for fluid overloaded   # Vertigo Patient states the meclizine  no longer works Resume to lorazepam  0.5 mg every 8 hours as needed for vertigo CT head imaging was considered however I noted that on 05/08/2024, patient had MRI of the brain with and without contrast outpatient for dizziness, nausea, vertigo symptoms for at least 6 weeks MRI brain w wo contrast: read as no acute intracranial abnormality.  Age-related atrophy and moderate periventricular and subcortical white matter disease.  Complete opacification of left maxillary sinus. PT consulted for Epley's maneuver. Started scopolamine  patch as per patient it helps, discontinued meclizine , patient never had any benefit with meclizine  in the past.  # IPF (idiopathic pulmonary fibrosis) Continue outpatient follow-up with pulmonologist   # Insomnia Gabapentin  100 mg daily resumed Trazodone  50 mg nightly as needed for sleep ordered   # GERD, continue  PPI # Rheumatoid arthritis, at home patient is on prednisone  3 mg p.o. daily home dose.  Resume  on discharge.  Currently patient is on IV Solu-Medrol .   Body mass index is 16.42 kg/m.  Interventions:  Diet: Heart Healthy DVT Prophylaxis: Eliquis   Advance goals of care discussion: DNR/DNI-limited  Family Communication: family was present at bedside, at the time of interview.  The pt provided permission to discuss medical plan with the family. Opportunity was given to ask question and all questions were answered satisfactorily.   Disposition:  Pt is from ALF [facility], admitted with PNA nad Afib, still has Resp failure and on Iv meds, which precludes a safe discharge. Discharge to ALF, when stable and cleared by cards, most likely 3 days.  Subjective: No significant events overnight.  In the morning time patient was fine and then later on she developed pain in the right arm possibly secondary to IV which improved after morphine  antibiotic given IV.  In the afternoon she suddenly developed respiratory failure so she was started on high flow oxygen . PCCM was consulted, goals of care were discussed and patient opted for DNR/DNI.   Physical Exam: General: Moderate to severe respiratory distress Eyes: PERRLA ENT: Oral Mucosa Clear, moist  Neck: no JVD,  Cardiovascular: S1 and S2 Present, no Murmur,  Respiratory: Increased work of breathing, bilateral crackles, no significant wheezes, decreased breath sounds on the right side Abdomen: BS present, Soft and no tenderness,  Skin: no rashes Extremities: no Pedal edema, no calf tenderness Neurologic: without any new focal findings Gait not checked due to patient safety concerns  Vitals:   06/07/24 1208 06/07/24 1211 06/07/24 1215 06/07/24 1218  BP:      Pulse:      Resp:      Temp:      TempSrc:      SpO2: (!) 82% (!) 75% (!) 80% (!) 82%  Weight:      Height:        Intake/Output Summary (Last 24 hours) at 06/07/2024 1610 Last data filed at 06/07/2024 1431 Gross per 24 hour  Intake 700.45 ml  Output --  Net 700.45 ml    Filed Weights   06/05/24 1254  Weight: 49 kg    Data Reviewed: I have personally reviewed and interpreted daily labs, tele strips, imagings as discussed above. I reviewed all nursing notes, pharmacy notes, vitals, pertinent old records I have discussed plan of care as described above with RN and patient/family.  CBC: Recent Labs  Lab 06/05/24 1258 06/06/24 0517 06/07/24 1239  WBC 10.6* 8.5 12.7*  NEUTROABS 9.4*  --   --   HGB 9.5* 9.0* 9.8*  HCT 28.1* 27.2* 29.1*  MCV 102.2* 103.4* 102.1*  PLT 356 319 465*   Basic Metabolic Panel: Recent Labs  Lab 06/05/24 1258 06/06/24 0517 06/06/24 0617 06/07/24 1239  NA 127* 132*  --  129*  K 4.1 3.7  --  4.1  CL 93* 98  --  94*  CO2 23 27  --  25  GLUCOSE 109* 150*  --  119*  BUN 14 13  --  10  CREATININE 0.44 0.50  --  0.71  CALCIUM 7.8* 7.8*  --  7.8*  MG 1.8  --  1.9 2.0  PHOS  --   --  2.3* 2.4*    Studies: CT Angio Chest Pulmonary Embolism (PE) W or WO Contrast Result Date: 06/07/2024 CLINICAL DATA:  Pulmonary embolism suspected high probability. History of interstitial  lung disease presenting with cough and dizziness EXAM: CT ANGIOGRAPHY CHEST WITH CONTRAST TECHNIQUE: Multidetector CT imaging of the chest was performed using the standard protocol during bolus administration of intravenous contrast. Multiplanar CT image reconstructions and MIPs were obtained to evaluate the vascular anatomy. RADIATION DOSE REDUCTION: This exam was performed according to the departmental dose-optimization program which includes automated exposure control, adjustment of the mA and/or kV according to patient size and/or use of iterative reconstruction technique. CONTRAST:  75mL OMNIPAQUE  IOHEXOL  350 MG/ML SOLN COMPARISON:  Same day chest radiograph and prior studies FINDINGS: Cardiovascular: Motion artifact and contrast bolus timing limits evaluation specifically of the bilateral lower lobe pulmonary artery branches. No definite pulmonary embolism  identified elsewhere. Mediastinum/Nodes: Redemonstration of marked dilation of the proximal esophagus. Unchanged mildly enlarged mediastinal lymph nodes with a right paratracheal node measuring up to 1.0 cm in short axis, possibly reactive. Lungs/Pleura: Extensive bibasilar airspace disease with slightly increased consolidation in the bilateral lower lobes. Extensive bronchiectasis at the bilateral lower lobes with peripheral reticular opacities throughout the lungs, similar to prior. Small right and trace left pleural effusion. Upper Abdomen: No acute findings. Musculoskeletal: No acute osseous findings. Review of the MIP images confirms the above findings. IMPRESSION: 1. Limited examination with nondiagnostic evaluation of the bilateral lower lobe pulmonary artery branches due to motion artifact and contrast bolus timing. However, no definite pulmonary embolism identified in the remaining pulmonary arteries bilaterally. 2. Extensive lower lobe predominant pulmonary opacities bilaterally, slightly increased compared to prior and possibly related infection. 3.  Small right and trace left pleural effusions. Aortic Atherosclerosis (ICD10-I70.0). Electronically Signed   By: Michaeline Blanch M.D.   On: 06/07/2024 15:52   US  EKG SITE RITE Result Date: 06/07/2024 If Site Rite image not attached, placement could not be confirmed due to current cardiac rhythm.  DG Chest Port 1 View Result Date: 06/07/2024 CLINICAL DATA:  Shortness of breath. EXAM: PORTABLE CHEST 1 VIEW COMPARISON:  June 05, 2024. FINDINGS: The heart size and mediastinal contours are within normal limits. Stable bibasilar infiltrates or edema is noted with stable right pleural effusion. The visualized skeletal structures are unremarkable. IMPRESSION: Stable bibasilar infiltrates or edema is noted with stable right pleural effusion. Electronically Signed   By: Lynwood Landy Raddle M.D.   On: 06/07/2024 12:58    Scheduled Meds:  apixaban   2.5 mg Oral BID    budesonide  (PULMICORT ) nebulizer solution  0.5 mg Nebulization BID   doxycycline   100 mg Oral Q12H   feeding supplement  237 mL Oral BID BM   gabapentin   100 mg Oral QHS   guaiFENesin   600 mg Oral BID   ipratropium-albuterol   3 mL Nebulization Q4H   methylPREDNISolone  (SOLU-MEDROL ) injection  20 mg Intravenous Q12H   multivitamin with minerals  1 tablet Oral Daily   pantoprazole   40 mg Oral Daily   scopolamine   1 patch Transdermal Q72H   Continuous Infusions:  ceFEPime  (MAXIPIME ) IV     diltiazem  (CARDIZEM ) infusion 10 mg/hr (06/07/24 1328)   linezolid  (ZYVOX ) IV     PRN Meds: acetaminophen  **OR** acetaminophen , chlorpheniramine-HYDROcodone, LORazepam , morphine  injection, ondansetron  **OR** ondansetron  (ZOFRAN ) IV, oxyCODONE , traZODone   Time spent: 55 minutes  Author: ELVAN SOR. MD [facility] 06/07/2024 4:10 PM  To reach On-call, see care teams to locate the attending and reach out to them via [email or web address]. If 7PM-7AM, please contact night-coverage If you still have difficulty reaching the attending provider, please page the [facility] (Director on Call) for [facility] on amion for  assistance.

## 2024-06-07 NOTE — Progress Notes (Signed)
 Pt and daughter report multiple IV attempts. R forearm hematoma noted from failed PIV. Midline not recommended due to need for labs, multiple IV meds and fragile veins. Discussed with Donnice, RN and Dr. Von. OK to place PICC, per MD.

## 2024-06-07 NOTE — IPAL (Signed)
  Interdisciplinary Goals of Care Family Meeting   Date carried out: 06/07/2024  Location of the meeting: Bedside  Member's involved: Physician, Bedside Registered Nurse, and Family Member or next of kin    GOALS OF CARE DISCUSSION  The Clinical status was relayed to family in detail-Daughter and Granddaughter  Updated and notified of patients medical condition- Patient with increased WOB and using accessory muscles to breathe Explained to family course of therapy and the modalities  A very high probablity of a very minimal chance of meaningful recovery despite all aggressive and optimal medical therapy.   Patient/Family understands the situation.  They have consented and agreed to DNR/DNI status We will continue medical care  Family are satisfied with Plan of action and management. All questions answered  Additional CC time 35 mins   Sherline Eberwein Alm Cellar, M.D.  [facility]  Medical Director [facility] Medical Director [facility] Cardio-Pulmonary Department

## 2024-06-07 NOTE — Progress Notes (Signed)
 Worsening shortness of breath through the course of the afternoon -Sinus tachycardia converting to atrial fibrillation after 2 PM despite being on diltiazem  infusion at 10 mg/h -On nonrebreather 15 L saturations 80-90 -Recommended we start amiodarone  bolus with infusion -Once PICC line obtained, could reinitiate diltiazem  infusion -Remains on cefepime , doxycycline , steroids, morphine  for comfort - ICU team following  Signed, Velinda Lunger, MD, Ph.D [facility]

## 2024-06-07 NOTE — Progress Notes (Signed)
 Initial Nutrition Assessment  DOCUMENTATION CODES:   Underweight, Severe malnutrition in context of chronic illness  INTERVENTION:   -Liberalize diet to regular with 1.5 L fluid restriction for widest variety of meal selections -Ensure Plus High Protein po BID, each supplement provides 350 kcal and 20 grams of protein  -MVI with minerals daily  NUTRITION DIAGNOSIS:   Severe Malnutrition related to chronic illness (pulmonary fibrosis) as evidenced by moderate fat depletion, severe fat depletion, moderate muscle depletion, severe muscle depletion.  GOAL:   Patient will meet greater than or equal to 90% of their needs  MONITOR:   PO intake, Supplement acceptance  REASON FOR ASSESSMENT:   Malnutrition Screening Tool    ASSESSMENT:   Pt with history of interstitial lung disease, vertigo, history of nontraumatic compression fracture, rheumatoid arthritis, polyarthralgia, bronchiectasis, idiopathic pulmonary fibrosis, insomnia, GERD, who presents emergency department for chief concerns of dizziness, productive cough for about 2 weeks PTA.  Pt admitted with a-fib with RVR.   Reviewed I/O's: +701 ml x 24 hours and +2.8 L since admission  Pt lives at [facility].   Spoke with pt and daughter at bedside. Pt pleasant and in good spirits, but complains of the room spinning. Pt reports decreased appetite over the past month or so, stating she can only eat small amounts. She has been supplementing her diet with Ensure.   Pt daughter brought in coffee., juices, and a pastry. Pt also reports she will have a boiled eggs for breakfast and is getting ready to order.   Wt has been stable over the past 6 months. Pt with moderate edema, which may be masking further weight loss as well as fat and muscle depletions.   Discussed importance of good meal and supplement intake to promote healing. Pt amenable to supplements.   Medications reviewed and include neurontin , protonix ,  prednisone , and cardizem .   Labs reviewed: Na: 132, Phos: 2.3.    NUTRITION - FOCUSED PHYSICAL EXAM:  Flowsheet Row Most Recent Value  Orbital Region Moderate depletion  Upper Arm Region Severe depletion  Thoracic and Lumbar Region Severe depletion  Buccal Region Moderate depletion  Temple Region Moderate depletion  Clavicle Bone Region Severe depletion  Clavicle and Acromion Bone Region Severe depletion  Scapular Bone Region Severe depletion  Dorsal Hand Moderate depletion  Patellar Region Mild depletion  Anterior Thigh Region Mild depletion  Posterior Calf Region Mild depletion  Edema (RD Assessment) Moderate  Hair Reviewed  Eyes Reviewed  Mouth Reviewed  Skin Reviewed  Nails Reviewed    Diet Order:   Diet Order             Diet regular Fluid consistency: Thin; Fluid restriction: 1500 mL Fluid  Diet effective now                   EDUCATION NEEDS:   Education needs have been addressed  Skin:  Skin Assessment: Reviewed RN Assessment  Last BM:  06/07/24  Height:   Ht Readings from Last 1 Encounters:  06/05/24 5' 8 (1.727 m)    Weight:   Wt Readings from Last 1 Encounters:  06/05/24 49 kg    Ideal Body Weight:  63.6 kg  BMI:  Body mass index is 16.42 kg/m.  Estimated Nutritional Needs:   Kcal:  1500-1700  Protein:  75-90 grams  Fluid:  1.5-1.7 L    Margery ORN, RD, LDN, CDCES Registered Dietitian III Certified Diabetes Care and Education Specialist If unable to reach this  RD, please use RD Inpatient group chat on secure chat between hours of 8am-4 pm daily

## 2024-06-07 NOTE — Care Management Important Message (Signed)
 Important Message  Patient Details  Name: Crystal Gregory MRN: [id number] Date of Birth: 02/01/1932   Important Message Given:  Yes - Medicare IM     [name] 06/07/2024, 1:59 PM

## 2024-06-07 NOTE — Plan of Care (Signed)
 Patient with worsening hypoxia with afib-Met with family again.   Family hoping for a miracle as patient is suffering and struggling to breathe.  Patient is in the dying process. I do NOT recommend pressors as this will prolong the inevitable. Family agrees with plan of continuing care for now

## 2024-06-07 NOTE — Progress Notes (Signed)
   06/07/24 1200  Spiritual Encounters  Type of Visit Initial  Care provided to: [facility] partners present during encounter Nurse;Physician  Referral source Code page  Reason for visit Code  OnCall Visit Yes  Interventions  Spiritual Care Interventions Made Established relationship of care and support;Compassionate presence;Encouragement  Intervention Outcomes  Outcomes Connection to spiritual care   Chaplain provided compassionate presence to the family. Family mentioned patient is very independent and competent. The family aligned with the patient wishes.Family member mentioned that patient was ready to join dead spouse. Quality of life was a reoccurring theme.

## 2024-06-07 NOTE — Progress Notes (Signed)
 Peripherally Inserted Central Catheter Placement  The IV Nurse has discussed with the patient and/or persons authorized to consent for the patient, the purpose of this procedure and the potential benefits and risks involved with this procedure.  The benefits include less needle sticks, lab draws from the catheter, and the patient may be discharged home with the catheter. Risks include, but not limited to, infection, bleeding, blood clot (thrombus formation), and puncture of an artery; nerve damage and irregular heartbeat and possibility to perform a PICC exchange if needed/ordered by physician.  Alternatives to this procedure were also discussed.  Bard Power PICC patient education guide, fact sheet on infection prevention and patient information card has been provided to patient /or left at bedside.    PICC Placement Documentation  PICC Triple Lumen 06/07/24 Right Basilic 34 cm 0 cm (Active)  Indication for Insertion or Continuance of Line Prolonged intravenous therapies 06/07/24 1900  Exposed Catheter (cm) 0 cm 06/07/24 1900  Site Assessment Clean, Dry, Intact 06/07/24 1900  Lumen #1 Status Saline locked;Blood return noted 06/07/24 1900  Lumen #2 Status Saline locked;Blood return noted 06/07/24 1900  Lumen #3 Status Saline locked;Blood return noted 06/07/24 1900  Dressing Type Transparent;Securing device 06/07/24 1900  Dressing Status Antimicrobial disc/dressing in place;Clean, Dry, Intact 06/07/24 1900  Line Care Connections checked and tightened 06/07/24 1900  Line Adjustment (NICU/IV Team Only) No 06/07/24 1900  Dressing Intervention New dressing 06/07/24 1900  Dressing Change Due 06/14/24 06/07/24 1900   Consent signed by daughter at bedside.    Crystal Gregory 06/07/2024, 7:21 PM

## 2024-06-07 NOTE — Progress Notes (Signed)
 Progress Note  Patient Name: Crystal Gregory Date of Encounter: 06/07/2024 [facility] Cardiologist: Timothy Gollan, MD   Interval Summary    Patient reports she is overall feeling better. She is still coughing. She is in NSR.   Vital Signs Vitals:   06/06/24 2327 06/07/24 0000 06/07/24 0100 06/07/24 0319  BP: (!) 114/50 (!) 115/50 (!) 112/57 123/64  Pulse: 85 77 69 79  Resp:  20 20 20   Temp:    97.9 F (36.6 C)  TempSrc:    Oral  SpO2: 97% 97% 100% 100%  Weight:      Height:        Intake/Output Summary (Last 24 hours) at 06/07/2024 0846 Last data filed at 06/07/2024 0300 Gross per 24 hour  Intake 700.45 ml  Output --  Net 700.45 ml      06/05/2024   12:54 PM 05/24/2024   10:24 AM 05/03/2024   10:07 AM  Last 3 Weights  Weight (lbs) 108 lb 102 lb 99 lb 9.6 oz  Weight (kg) 48.988 kg 46.267 kg 45.178 kg      Telemetry/ECG  NSR HR 70s - Personally Reviewed  Physical Exam  GEN: No acute distress.   Neck: No JVD Cardiac: RRR, no murmurs, rubs, or gallops.  Respiratory: Clear to auscultation bilaterally. GI: Soft, nontender, non-distended  MS: No edema  Relevant CV Studies:  Echo 06/07/24 1. Left ventricular ejection fraction, by estimation, is 60 to 65%. The  left ventricle has normal function. The left ventricle has no regional  wall motion abnormalities. Left ventricular diastolic parameters are  consistent with Grade I diastolic  dysfunction (impaired relaxation). The average left ventricular global  longitudinal strain is -14.1 %.   2. Right ventricular systolic function is normal. The right ventricular  size is normal.   3. The mitral valve is normal in structure. Mild to moderate mitral valve  regurgitation. No evidence of mitral stenosis.   4. The aortic valve is normal in structure. Aortic valve regurgitation is  not visualized. Aortic valve sclerosis is present, with no evidence of  aortic valve stenosis.   5. The inferior vena cava is normal in  size with greater than 50%  respiratory variability, suggesting right atrial pressure of 3 mmHg.    Echo 2023 1. Left ventricular ejection fraction, by estimation, is 60 to 65%. The  left ventricle has normal function. The left ventricle has no regional  wall motion abnormalities. Left ventricular diastolic parameters are  consistent with Grade I diastolic  dysfunction (impaired relaxation). The average left ventricular global  longitudinal strain is -20.3 %. The global longitudinal strain is normal.   2. Right ventricular systolic function is normal. The right ventricular  size is normal. There is mildly elevated pulmonary artery systolic  pressure. The estimated right ventricular systolic pressure is 37.8 mmHg.   3. Right atrial size was mildly dilated.   4. The mitral valve is normal in structure. No evidence of mitral valve  regurgitation.   5. The aortic valve is tricuspid. Aortic valve regurgitation is not  visualized.   6. The inferior vena cava is normal in size with <50% respiratory  variability, suggesting right atrial pressure of 8 mmHg.      Echo 2022 1. Left ventricular ejection fraction, by estimation, is 60 to 65%. The  left ventricle has normal function. The left ventricle has no regional  wall motion abnormalities. Left ventricular diastolic parameters are  consistent with Grade II diastolic  dysfunction (pseudonormalization).  2. Right ventricular systolic function is normal. The right ventricular  size is normal. There is mildly elevated pulmonary artery systolic  pressure. The estimated right ventricular systolic pressure is 40.9 mmHg.   3. Left atrial size was mildly dilated.   4. The mitral valve is normal in structure. Mild mitral valve  regurgitation. No evidence of mitral stenosis.   5. Tricuspid valve regurgitation is moderate to severe.   6. The aortic valve is normal in structure.Trileaflet. Aortic valve  regurgitation is not visualized. Mild aortic  valve sclerosis is present,  with no evidence of aortic valve stenosis.   7. The inferior vena cava is normal in size with <50% respiratory  variability, suggesting right atrial pressure of 8 mmHg.    Echo 2021  1. Left ventricular ejection fraction, by estimation, is 60 to 65%. The  left ventricle has normal function. The left ventricle has no regional  wall motion abnormalities. Left ventricular diastolic parameters were  normal.   2. Right ventricular systolic function is normal. The right ventricular  size is normal. There is mildly elevated pulmonary artery systolic  pressure. The estimated right ventricular systolic pressure is 39.4 mmHg.   3. Left atrial size was mildly dilated.   4. Tricuspid valve regurgitation is mild to moderate.    MPI 09/2018   Narrative & Impression  There was no ST segment deviation noted during stress. The study is normal. This is a low risk study. The left ventricular ejection fraction is hyperdynamic (>65%).    Echo 10/2016 Left ventricle: The cavity size was normal. Systolic function was    normal. The estimated ejection fraction was in the range of 60%    to 65%. Wall motion was normal; there were no regional wall    motion abnormalities. Doppler parameters are consistent with    abnormal left ventricular relaxation (grade 1 diastolic    dysfunction).  - Mitral valve: There was mild regurgitation.  - Left atrium: The atrium was normal in size.  - Right ventricle: Systolic function was normal.  - Tricuspid valve: There was mild-moderate regurgitation.  - Pulmonary arteries: Systolic pressure was midlly elevated. PA    peak pressure: 35 mm Hg (S).  - Inferior vena cava: The vessel was normal in size. The    respirophasic diameter changes were in the normal range (>= 50%),    consistent with normal central venous pressure.     Assessment & Plan   New onset Afib - the patient presented with dizziness, palpitations and cough found to have  new onset Afib and PNA started on IV diltiazem  with conversion to normal sinus rhythm - CHA2DS2-VASc of 3 (age, gender) patient was started on Eliquis  2.5 mg twice daily - Patient remains in normal sinus rhythm - Can convert IV Cardizem  to oral - Mag 1.9 and K 3.7   Multilobar PNA - Antibiotics per IM   Hyponatremia - Status post sodium chloride  500 mL liter bolus per ED - On admission LR 1L bolus one-time - Na 132   IDF - She is followed by pulmonology as outpatient    For questions or updates, please contact [facility] Please consult [email or web address] for contact info under       Signed, Rolf Fells VEAR Fishman, PA-C

## 2024-06-07 NOTE — Consult Note (Signed)
 Consultation Note Date: 06/07/2024 at 1200  Patient Name: Crystal Gregory  DOB: 1932-03-20  MRN: [id number]  Age / Sex: 89 y.o., female  PCP: Crystal Comer POUR, NP Referring Physician: Von Bellis, MD  HPI/Patient Profile: 89 y.o. female  with past medical history of interstitial lung, vertigo, history of nontraumatic compression fracture, RA, polyarthralgia, bronchiectasis, idiopathic pulmonary fibrosis, insomnia, GERD admitted on 06/05/2024 with some productive cough for 2 weeks.  Patient being treated for multilobar lung infiltrates, vertigo, and A-fib with RVR.  Just prior to me meeting the patient, rapid response was called for hypoxic event.   PMT was consulted to support patient and family with goals of care discussions.  Clinical Assessment and Goals of Care: Extensive chart review completed prior to meeting patient including labs, vital signs, imaging, progress notes, orders, and available advanced directive documents from current and previous encounters. I then met with patient, her daughter Crystal Gregory, and her granddaughter Crystal Gregory at bedside to discuss diagnosis prognosis, GOC, EOL wishes, disposition and options.  I introduced Palliative Medicine as specialized medical care for people living with serious illness. It focuses on providing relief from the symptoms and stress of a serious illness. The goal is to improve quality of life for both the patient and the family.  We discussed a brief life review of the patient.  Patient is a retired Engineer, civil (consulting).  She is originally from [place]  but moved several times since her husband was in the [employer].  He passed away 2 years ago.  We discussed patient's current illness and what it means in the larger context of patient's on-going co-morbidities.  Alongside CCM Dr. Isaiah, we discussed patient's lung disease, functional status, and overall prognosis.  Space Opportunity  provided for family and patient to ask questions as well as concerns.  Attempted to elicit values and goals important to the patient.  She shares she wants to keep living but does not want to keep living like this.  When asked to explain further, she shares she just feels terrible and does not want to feel like this any longer.  Daughter endorses patient either wants to go home to feel better or to pass away so she can be with her husband.   Discussed with patient that she could potentially have a pulmonary embolism.  Discussed need to have additional blood work and x-ray to gather more information.  She shares she is in agreement with these tests.  When asked if she would be accepting of ventilatory support, she shares with me to speak with her daughter.  Dr. Isaiah and I had a detailed discussion with daughter and granddaughter in regards to CODE STATUS.  Patient and family are in agreement with DNR and DNI status.  Dr. Edd changed CODE STATUS in [facility].  Continue to treat the treatable.  Discussed aggressive symptom management with antibiotics, nebulizer treatments, and steroids.  Additionally, discussed use of morphine  for air hunger and increased work of breathing.  Patient and family are in agreement for low-dose morphine  in case of acute  respiratory distress.  Additionally, patient has complaints of nausea with vertigo.  Given patient has prolonged QT, Zofran  not appropriate at this time.  Phenergan  as needed to be given for nausea.  Discussed with patient/family the importance of continued conversation with family and the medical providers regarding overall plan of care and treatment options, ensuring decisions are within the context of the patient's values and GOCs.    Questions and concerns were addressed. The family was encouraged to call with questions or concerns.  PMT will continue to follow and support.  Primary Decision Maker PATIENT  Physical Exam Vitals reviewed.  Constitutional:       Comments: Thin, frail  HENT:     Head: Normocephalic.     Mouth/Throat:     Mouth: Mucous membranes are moist.  Eyes:     Pupils: Pupils are equal, round, and reactive to light.  Cardiovascular:     Rate and Rhythm: Tachycardia present.  Pulmonary:     Effort: Respiratory distress present.  Abdominal:     Palpations: Abdomen is soft.  Musculoskeletal:     Comments: Generalized weakness  Skin:    General: Skin is warm and dry.     Coloration: Skin is pale.  Neurological:     Mental Status: She is alert and oriented to person, place, and time.  Psychiatric:        Mood and Affect: Mood normal.        Behavior: Behavior normal.        Thought Content: Thought content normal.        Judgment: Judgment normal.     Palliative Assessment/Data: 50-60%     Thank you for this consult. Palliative medicine will continue to follow and assist holistically.   Time Total: 90 minutes  Time spent includes: Detailed review of medical records (labs, imaging, vital signs), medically appropriate exam (mental status, respiratory, cardiac, skin), discussed with treatment team, counseling and educating patient, family and staff, documenting clinical information, medication management and coordination of care.  Signed by: Crystal Gunner, DNP, FNP-BC Palliative Medicine   Please contact Palliative Medicine Team providers via [facility] for questions and concerns.

## 2024-06-07 NOTE — Progress Notes (Signed)
 PT Cancellation Note  Patient Details Name: Crystal Gregory MRN: [id number] DOB: [birth year]   Cancelled Treatment:    Reason Eval/Treat Not Completed: Patient not medically ready.  Pt with recent rapid response.  Will hold therapy at this time and re-attempt PT evaluation at a later date/time as medically appropriate.  [name] Caulk, PT 06/07/24, 1:00 PM

## 2024-06-07 NOTE — Progress Notes (Signed)
   06/07/24 1159  Assess: MEWS Score  Temp 98.5 F (36.9 C)  BP (!) 142/75  Pulse Rate 100  Resp (!) 35  Level of Consciousness Alert  SpO2 92 %  O2 Device Nasal Cannula  O2 Flow Rate (L/min) 2 L/min  Assess: MEWS Score  MEWS Temp 0  MEWS Systolic 0  MEWS Pulse 0  MEWS RR 2  MEWS LOC 0  MEWS Score 2  MEWS Score Color Yellow  Assess: if the MEWS score is Yellow or Red  Were vital signs accurate and taken at a resting state? Yes  Does the patient meet 2 or more of the SIRS criteria? Yes  Does the patient have a confirmed or suspected source of infection? Yes  MEWS guidelines implemented  Yes, yellow  Treat  MEWS Interventions Considered administering scheduled or prn medications/treatments as ordered  Take Vital Signs  Increase Vital Sign Frequency  Yellow: Q2hr x1, continue Q4hrs until patient remains green for 12hrs  Escalate  MEWS: Escalate Yellow: Discuss with charge nurse and consider notifying provider and/or RRT  Notify: Charge Nurse/RN  Name of Charge Nurse/RN Notified John  Provider Notification  Provider Name/Title Von  Date Provider Notified 06/07/24  Time Provider Notified 1220  Method of Notification Call  Notification Reason Change in status  Provider response At bedside  Date of Provider Response 06/07/24  Time of Provider Response 1225  Notify: Rapid Response  Name of Rapid Response RN Notified Sarah  Date Rapid Response Notified 06/07/24  Time Rapid Response Notified 1225  Assess: SIRS CRITERIA  SIRS Temperature  0  SIRS Respirations  1  SIRS Pulse 1  SIRS WBC 0  SIRS Score Sum  2

## 2024-06-08 DIAGNOSIS — J9621 Acute and chronic respiratory failure with hypoxia: Secondary | ICD-10-CM

## 2024-06-08 DIAGNOSIS — J189 Pneumonia, unspecified organism: Secondary | ICD-10-CM | POA: Diagnosis not present

## 2024-06-08 DIAGNOSIS — Z515 Encounter for palliative care: Secondary | ICD-10-CM

## 2024-06-08 DIAGNOSIS — R42 Dizziness and giddiness: Secondary | ICD-10-CM | POA: Diagnosis not present

## 2024-06-08 DIAGNOSIS — R918 Other nonspecific abnormal finding of lung field: Secondary | ICD-10-CM | POA: Diagnosis not present

## 2024-06-08 DIAGNOSIS — I340 Nonrheumatic mitral (valve) insufficiency: Secondary | ICD-10-CM | POA: Diagnosis not present

## 2024-06-08 DIAGNOSIS — I4891 Unspecified atrial fibrillation: Secondary | ICD-10-CM | POA: Diagnosis not present

## 2024-06-08 LAB — BASIC METABOLIC PANEL WITH GFR
Anion gap: 10 (ref 5–15)
BUN: 12 mg/dL (ref 8–23)
CO2: 30 mmol/L (ref 22–32)
Calcium: 7.5 mg/dL — ABNORMAL LOW (ref 8.9–10.3)
Chloride: 93 mmol/L — ABNORMAL LOW (ref 98–111)
Creatinine, Ser: 0.47 mg/dL (ref 0.44–1.00)
GFR, Estimated: >60 mL/min (ref >60)
Glucose, Bld: 127 mg/dL — ABNORMAL HIGH (ref 70–99)
Potassium: 3.7 mmol/L (ref 3.5–5.1)
Sodium: 133 mmol/L — ABNORMAL LOW (ref 135–145)

## 2024-06-08 LAB — CBC
HCT: 23.4 % — ABNORMAL LOW (ref 36.0–46.0)
Hemoglobin: 8 g/dL — ABNORMAL LOW (ref 12.0–15.0)
MCH: 34.8 pg — ABNORMAL HIGH (ref 26.0–34.0)
MCHC: 34.2 g/dL (ref 30.0–36.0)
MCV: 101.7 fL — ABNORMAL HIGH (ref 80.0–100.0)
Platelets: 342 K/uL (ref 150–400)
RBC: 2.3 MIL/uL — ABNORMAL LOW (ref 3.87–5.11)
RDW: 14.6 % (ref 11.5–15.5)
WBC: 11.4 K/uL — ABNORMAL HIGH (ref 4.0–10.5)
nRBC: 0 % (ref 0.0–0.2)

## 2024-06-08 LAB — RESPIRATORY PANEL BY PCR

## 2024-06-08 LAB — MRSA NEXT GEN BY PCR, NASAL: MRSA by PCR Next Gen: NOT DETECTED

## 2024-06-08 LAB — PHOSPHORUS: Phosphorus: 3.4 mg/dL (ref 2.5–4.6)

## 2024-06-08 LAB — MAGNESIUM: Magnesium: 1.8 mg/dL (ref 1.7–2.4)

## 2024-06-08 MED ORDER — DIPHENHYDRAMINE HCL 50 MG/ML IJ SOLN
12.5000 mg | Freq: Four times a day (QID) | INTRAMUSCULAR | Status: DC | PRN
  Administered 2024-06-08 – 2024-06-10 (×4): 12.5 mg via INTRAVENOUS
  Filled 2024-06-08 (×4): qty 1

## 2024-06-08 MED ORDER — DIPHENHYDRAMINE HCL 50 MG/ML IJ SOLN
12.5000 mg | Freq: Once | INTRAMUSCULAR | Status: AC
  Administered 2024-06-08: 12.5 mg via INTRAVENOUS
  Filled 2024-06-08: qty 1

## 2024-06-08 NOTE — Progress Notes (Signed)
 PT Cancellation Note  Patient Details Name: Crystal Gregory MRN: [id number] DOB: 1932/02/02   Cancelled Treatment:    Reason Eval/Treat Not Completed: Other (comment).  Chart reviewed.  Pt resting in bed upon PT arrival; pt declining therapy today (pt requesting to rest and reporting not feeling up to therapy) but agreeable to therapy attempting tomorrow instead.  [name] Caulk, PT 06/08/24, 12:31 PM

## 2024-06-08 NOTE — Progress Notes (Addendum)
 Progress Note   Patient: Crystal Gregory FMW:[id number] DOB: 31-Aug-1932 DOA: 06/05/2024     3 DOS: the patient was seen and examined on 06/08/2024   Brief hospital course: [name] is a 89 year old female with history of interstitial lung disease, vertigo, history of nontraumatic compression fracture, rheumatoid arthritis, polyarthralgia, bronchiectasis, idiopathic pulmonary fibrosis, insomnia, GERD, who presents emergency department for chief concerns of dizziness, productive cough for about 2 weeks.  Vitals in the ED showed t of 98.5, rr 18, hr 98, blood pressure 114/68, SpO2 96% on room air.  Serum sodium is 127, potassium 4.1, chloride 93, bicarb 23, BUN 14, serum creatinine 0.44, EGFR greater than 60, nonfasting blood glucose 109, WBC 10.6, hemoglobin 9.5, platelets of 356.  BNP is 383.8, hs troponin is 15, Pro-Cal is 0.16, lactic acid 1.2, D-dimer 5.43, COVID/influenza A/influenza B/RSV PCR were negative.  ED treatment: Acetaminophen  1000 mg p.o. one-time dose, diltiazem  bolus and gtt., ceftriaxone  1 g IV one-time dose, doxycycline  100 mg IV, sodium chloride  500 mL bolus.  Assessment and Plan: # Acute hypoxic respiratory failure  # Multilobar lung infiltrate [facility]-acquired pneumonia Continue with doxycycline  100 mg twice daily S/p Ceftriaxone  2 g IV daily Mucinex  600 mg p.o. twice daily, Tussionex prn  Patient was started on cefepime , Zyvox , steroids and breathing treatments  due to worsening pneumonia as per pulm consult. Palliative care was involved, patient opted for DNR/DNI but continue current treatment. Clinically feels better today compared to yesterday. She was on HFNC She is on 5L of oxygen  During my encounter.       # Atrial fibrillation with RVR (HCC) S/p Diltiazem  10 mg IV one-time dose Continue diltiazem  gtt. CHA2DS2VASc = 3 (age > 50, gender: Female) Discussed risks/benefits of anticoagulation with atrial fibrillation, including GI bleed,  intracranial bleeding especially with trauma, increased bruising.  Patient advised to let PCP/provider know should she develop the symptoms. Patient endorses understanding of these risks and states she would like to be initiated on anticoagulation Eliquis  2.5 mg p.o. twice daily initiated [facility] cardiology 9/4 started amiodarone  IV infusion   # Isotonic hyponatremia S/p Nacl IVF 500 mL liter bolus per EDP On admission LR 1 L bolus x 1 given  Serum osmolality 278 within normal range Started fluid restriction for fluid overloaded     # Vertigo Patient states the meclizine  no longer works Resume to lorazepam  0.5 mg every 8 hours as needed for vertigo CT head imaging was considered however I noted that on 05/08/2024, patient had MRI of the brain with and without contrast outpatient for dizziness, nausea, vertigo symptoms for at least 6 weeks MRI brain w wo contrast: read as no acute intracranial abnormality.  Age-related atrophy and moderate periventricular and subcortical white matter disease.  Complete opacification of left maxillary sinus. PT consulted for Epley's maneuver. Started scopolamine  patch as per patient it helps, discontinued meclizine , patient never had any benefit with meclizine  in the past.   # IPF (idiopathic pulmonary fibrosis) Continue outpatient follow-up with pulmonologist   # Insomnia Gabapentin  100 mg daily resumed Trazodone  50 mg nightly as needed for sleep ordered   # GERD, continue PPI # Rheumatoid arthritis, at home patient is on prednisone  3 mg p.o. daily home dose.  Resume on discharge.  Currently patient is on IV Solu-Medrol .     #Severe Malnutrition related to chronic illness (pulmonary fibrosis) as evidenced by moderate fat depletion, severe fat depletion, moderate muscle depletion, severe muscle depletion.   Liberalize diet to regular with 1.5  L fluid restriction for widest variety of meal selections -Ensure Plus High Protein po BID, each supplement  provides 350 kcal and 20 grams of protein -MVI with minerals daily   Body mass index is 16.42 kg/m.     Diet: Heart Healthy DVT Prophylaxis: Eliquis    Advance goals of care discussion: DNR/DNI-limited   Family Communication: family was present at bedside, at the time of interview.  The pt provided permission to discuss medical plan with the family. Opportunity was given to ask question and all questions were answered satisfactorily.    Disposition:  Pt is from ALF [facility], admitted with PNA nad Afib, still has Resp failure and on Iv meds, which precludes a safe discharge. Discharge to ALF, when stable and cleared by cards, most likely 3 days.        Subjective: Feels better today compared to yesterday, she was asking about going home today. She remains on IV amiodarone . She remains with guarded prognosis  Physical Exam: Vitals:   06/08/24 0437 06/08/24 0625 06/08/24 0809 06/08/24 1133  BP: (!) 114/47 (!) 126/54 (!) 122/57 131/60  Pulse: 76 85 77 86  Resp:   15 18  Temp:   (!) 97.3 F (36.3 C) (!) 97.5 F (36.4 C)  TempSrc:      SpO2: 100% 100% 100% 98%  Weight:      Height:       General: Calm in no acute distress Eyes: PERRLA ENT: Oral Mucosa Clear, moist  Neck: no JVD,  Cardiovascular: S1 and S2 Present, no Murmur,  Respiratory: Diminished breath sounds, on 5L of oxygen . Abdomen: BS present, Soft and no tenderness,  Skin: no rashes Extremities: no Pedal edema, no calf tenderness Neurologic: without any new focal findings Data Reviewed:   Disposition: Status is: Inpatient   Planned Discharge Destination: Skilled nursing facility    Time spent: 35 minutes  Author: Landon FORBES Baller, MD 06/08/2024 3:12 PM  For on call review [email or web address].

## 2024-06-08 NOTE — Progress Notes (Signed)
 Palliative Care Progress Note, Assessment & Plan   Patient Name: Crystal Gregory       Date: 06/08/2024 DOB: [birth year]  Age: 89 y.o. MRN#: [id number] Attending Physician: Carlota Landon BRAVO, MD Primary Care Physician: [name], NP Admit Date: 06/05/2024  Subjective: Patient is sitting at edge of bed with daughter at bedside.  She is awake, alert, oriented x 4, able to acknowledge my presence, and make her wishes known.  She is asking when she can go home.  HPI: 89 y.o. female  with past medical history of interstitial lung, vertigo, history of nontraumatic compression fracture, RA, polyarthralgia, bronchiectasis, idiopathic pulmonary fibrosis, insomnia, GERD admitted on 06/05/2024 with some productive cough for 2 weeks.   Patient being treated for multilobar lung infiltrates, vertigo, and A-fib with RVR.   Just prior to me meeting the patient, rapid response was called for hypoxic event.    PMT was consulted to support patient and family with goals of care discussions.  Summary of counseling/coordination of care: Extensive chart review completed prior to meeting patient including labs, vital signs, imaging, progress notes, orders, and available advanced directive documents from current and previous encounters.   After reviewing the patient's chart and assessing the patient at bedside, I spoke with patient in regards to symptom management and goals of care.   Symptoms assessed.  Patient denies pain, discomfort, and other acute issues at this time.  No adjustment to [facility] needed.  Patient asked when she will be able to go home.  Discussed significance of converting IV medications to p.o. medications once stable.  However, given patient has flipped from A-fib to RVR x 2 postmonitoring will need to  continue.  Patient's daughter endorses patient has made significant improvement from yesterday, very eager to go home.  Discussed importance of not rushing discharge and that rehospitalization does not immediately occur.  Without prompting, patient shares she is never want to be placed on a ventilator.  She shares how she was a DNR over 20 years ago.  I confirmed with patient and her daughter that DNR with limited interventions is in place and will remain.  She is grateful for our discussion and remains eager to go home.  Symptom burden is low.  Goals are clear.  PMT will step back from daily visits and monitor the patient peripherally.  Please re-engage with PMT if goals change, at patient/family's request, or if patient's health deteriorates during hospitalization.    Family has PMT contact info and was advised to call should acute palliative needs arise.  Physical Exam Vitals reviewed.  Constitutional:      General: She is not in acute distress. HENT:     Head: Normocephalic.     Mouth/Throat:     Mouth: Mucous membranes are moist.  Eyes:     Pupils: Pupils are equal, round, and reactive to light.  Pulmonary:     Effort: Pulmonary effort is normal.     Comments: Poca in place Abdominal:     Palpations: Abdomen is soft.  Neurological:     Mental Status: She is alert and oriented to person, place, and time.  Psychiatric:        Mood and Affect:  Mood normal.        Behavior: Behavior normal.        Thought Content: Thought content normal.        Judgment: Judgment normal.             Total Time 25 minutes   Time spent includes: Detailed review of medical records (labs, imaging, vital signs), medically appropriate exam (mental status, respiratory, cardiac, skin), discussed with treatment team, counseling and educating patient, family and staff, documenting clinical information, medication management and coordination of care.  Lamarr L. Arvid, DNP, FNP-BC Palliative Medicine  Team

## 2024-06-08 NOTE — Plan of Care (Signed)
   Problem: Health Behavior/Discharge Planning: Goal: Ability to manage health-related needs will improve Outcome: Progressing   Problem: Clinical Measurements: Goal: Ability to maintain clinical measurements within normal limits will improve Outcome: Progressing   Problem: Clinical Measurements: Goal: Will remain free from infection Outcome: Progressing

## 2024-06-08 NOTE — Progress Notes (Signed)
 Occupational Therapy Treatment Patient Details Name: Conna Terada MRN: [id number] DOB: 1932/01/17 Today's Date: 06/08/2024   History of present illness Pt is a 89 year old female who presents to ED for chief concerns of dizziness, productive cough for about 2 weeks. MD assessment includes: A-fib with RVR, multilobar lung infiltrate and hyponatremia. PMH of interstitial lung disease, vertigo, history of nontraumatic compression fracture, rheumatoid arthritis, polyarthralgia, bronchiectasis, idiopathic pulmonary fibrosis, insomnia, GERD.   OT comments  Pt is supine in bed on arrival with daughter and granddaughter present. Pleasant and agreeable to OT session. She denies pain. Pt performed supine to sit at EOB with HOB elevated and use of bed rails with CGA. Pt required increased time/effort to scoot forward and Min A to reach standing at [facility] with cues for hand placement. Min/CGA for forward steps to reach sink and perform standing grooming tasks with unilateral support on sink and CGA to maintain balance. Pt also utilized [facility] for toileting prior to return to bed. Max A for clothing management, supervision needed for anterior hygiene and Min A for standing from [facility]. She required Mod A for BLE management to return to supine and Mod A X2 to scoot to [facility]. HR and sp02 stable throughout session on 2L 02. Pt returned to bed with all needs in place and will cont to require skilled acute OT services to maximize his safety and IND to return to PLOF.       If plan is discharge home, recommend the following:  A little help with walking and/or transfers;Assistance with cooking/housework;Help with stairs or ramp for entrance;A lot of help with bathing/dressing/bathroom   Equipment Recommendations  BSC/3in1;Other (comment) (defer to next venue)    Recommendations for Other Services      Precautions / Restrictions Precautions Precautions: Fall Recall of Precautions/Restrictions: Intact Precaution/Restrictions  Comments: watch 02/HR Restrictions Weight Bearing Restrictions Per Provider Order: No       Mobility Bed Mobility Overal bed mobility: Needs Assistance Bed Mobility: Supine to Sit, Sit to Supine     Supine to sit: Contact guard, HOB elevated, Used rails Sit to supine: Min assist, Mod assist   General bed mobility comments: BLE assist to return to supine    Transfers Overall transfer level: Needs assistance Equipment used: Rolling walker (2 wheels) Transfers: Sit to/from Stand Sit to Stand: Min assist           General transfer comment: cues for hand placement on bed and forward scoot to stand from EOB and BSC     Balance Overall balance assessment: Needs assistance Sitting-balance support: Feet supported, Bilateral upper extremity supported Sitting balance-Leahy Scale: Fair     Standing balance support: During functional activity, Single extremity supported Standing balance-Leahy Scale: Poor Standing balance comment: requires unilateral support on counter and CGA from therapist to maintain dynamic standing balance                           ADL either performed or assessed with clinical judgement   ADL Overall ADL's : Needs assistance/impaired     Grooming: Standing;Contact guard assist;Wash/dry face;Oral care Grooming Details (indicate cue type and reason): CGA for balance standing at sink with unilateral support on counter                 Toilet Transfer: BSC/3in1;Rolling walker (2 wheels);Minimal assistance   Toileting- Clothing Manipulation and Hygiene: Supervision/safety;Sitting/lateral lean;Maximal assistance;Sit to/from stand Toileting - Clothing Manipulation Details (indicate cue type and reason): anterior  hygiene on BSC supervison; Max A for clothing management in standing            Extremity/Trunk Assessment              Vision       Perception     Praxis     Communication Communication Communication: No apparent  difficulties Factors Affecting Communication: Hearing impaired (wears hearing aids)   Cognition Arousal: Alert Behavior During Therapy: WFL for tasks assessed/performed Cognition: No apparent impairments                               Following commands: Intact        Cueing      Exercises      Shoulder Instructions       General Comments HR 93-94 with activity and sp02 96-97% on 2L throughout session    Pertinent Vitals/ Pain       Pain Assessment Pain Assessment: No/denies pain Pain Intervention(s): Monitored during session  Home Living                                          Prior Functioning/Environment              Frequency  Min 2X/week        Progress Toward Goals  OT Goals(current goals can now be found in the care plan section)  Progress towards OT goals: Progressing toward goals  Acute Rehab OT Goals Patient Stated Goal: improve function OT Goal Formulation: With patient/family Time For Goal Achievement: 06/20/24 Potential to Achieve Goals: Fair  Plan      Co-evaluation                 AM-PAC OT 6 Clicks Daily Activity     Outcome Measure   Help from another person eating meals?: None Help from another person taking care of personal grooming?: None Help from another person toileting, which includes using toliet, bedpan, or urinal?: A Little Help from another person bathing (including washing, rinsing, drying)?: A Lot Help from another person to put on and taking off regular upper body clothing?: A Little Help from another person to put on and taking off regular lower body clothing?: A Lot 6 Click Score: 18    End of Session Equipment Utilized During Treatment: Gait belt;Rolling walker (2 wheels)  OT Visit Diagnosis: Other abnormalities of gait and mobility (R26.89);Muscle weakness (generalized) (M62.81)   Activity Tolerance Patient tolerated treatment well   Patient Left in bed;with call  bell/phone within reach;with bed alarm set;with family/visitor present   Nurse Communication Mobility status        Time: 8370-8347 OT Time Calculation (min): 23 min  Charges: OT General Charges $OT Visit: 1 Visit OT Treatments $Self Care/Home Management : 23-37 mins  Azim Gillingham, OTR/L  06/08/24, 5:17 PM   Suraiya Dickerson E Rasheka Denard 06/08/2024, 5:14 PM

## 2024-06-08 NOTE — Progress Notes (Signed)
 Progress Note  Patient Name: Crystal Gregory Date of Encounter: 06/08/2024 [facility] Cardiologist: Evalene Lunger, MD   Interval Summary    Overnight breathing became worse and she was placed on NRB with 15L sat 80-90. She went back into afib despite being on dilt, so this was stopped and she was started on IV amiodarone .  She converted back to NSR shortly after, around 5:30 PM  She is currently on HFNC on 6L. BP 122/57. Afebrile. Chest CTA showed no PE and mildly worsening opacities. She feels much better than yesterday. Hgb 8. Mag 1.8. she is asking to go home.   Vital Signs Vitals:   06/08/24 0409 06/08/24 0437 06/08/24 0625 06/08/24 0809  BP: (!) 116/38 (!) 114/47 (!) 126/54 (!) 122/57  Pulse: 79 76 85 77  Resp: 18   15  Temp: (!) 97.5 F (36.4 C)   (!) 97.3 F (36.3 C)  TempSrc: Oral     SpO2: 100% 100% 100% 100%  Weight:      Height:        Intake/Output Summary (Last 24 hours) at 06/08/2024 0810 Last data filed at 06/07/2024 1800 Gross per 24 hour  Intake 296.75 ml  Output --  Net 296.75 ml      06/05/2024   12:54 PM 05/24/2024   10:24 AM 05/03/2024   10:07 AM  Last 3 Weights  Weight (lbs) 108 lb 102 lb 99 lb 9.6 oz  Weight (kg) 48.988 kg 46.267 kg 45.178 kg      Telemetry/ECG  SR>Afib>SB HR 50s - Personally Reviewed  Physical Exam  GEN: No acute distress.   Neck: No JVD Cardiac: RRR, no murmurs, rubs, or gallops.  Respiratory: diffusely diminished GI: Soft, nontender, non-distended  MS: No edema   Relevant CV Studies:   Echo 06/07/24 1. Left ventricular ejection fraction, by estimation, is 60 to 65%. The  left ventricle has normal function. The left ventricle has no regional  wall motion abnormalities. Left ventricular diastolic parameters are  consistent with Grade I diastolic  dysfunction (impaired relaxation). The average left ventricular global  longitudinal strain is -14.1 %.   2. Right ventricular systolic function is normal. The  right ventricular  size is normal.   3. The mitral valve is normal in structure. Mild to moderate mitral valve  regurgitation. No evidence of mitral stenosis.   4. The aortic valve is normal in structure. Aortic valve regurgitation is  not visualized. Aortic valve sclerosis is present, with no evidence of  aortic valve stenosis.   5. The inferior vena cava is normal in size with greater than 50%  respiratory variability, suggesting right atrial pressure of 3 mmHg.    Echo 2023 1. Left ventricular ejection fraction, by estimation, is 60 to 65%. The  left ventricle has normal function. The left ventricle has no regional  wall motion abnormalities. Left ventricular diastolic parameters are  consistent with Grade I diastolic  dysfunction (impaired relaxation). The average left ventricular global  longitudinal strain is -20.3 %. The global longitudinal strain is normal.   2. Right ventricular systolic function is normal. The right ventricular  size is normal. There is mildly elevated pulmonary artery systolic  pressure. The estimated right ventricular systolic pressure is 37.8 mmHg.   3. Right atrial size was mildly dilated.   4. The mitral valve is normal in structure. No evidence of mitral valve  regurgitation.   5. The aortic valve is tricuspid. Aortic valve regurgitation is not  visualized.  6. The inferior vena cava is normal in size with <50% respiratory  variability, suggesting right atrial pressure of 8 mmHg.      Echo 2022 1. Left ventricular ejection fraction, by estimation, is 60 to 65%. The  left ventricle has normal function. The left ventricle has no regional  wall motion abnormalities. Left ventricular diastolic parameters are  consistent with Grade II diastolic  dysfunction (pseudonormalization).   2. Right ventricular systolic function is normal. The right ventricular  size is normal. There is mildly elevated pulmonary artery systolic  pressure. The estimated right  ventricular systolic pressure is 40.9 mmHg.   3. Left atrial size was mildly dilated.   4. The mitral valve is normal in structure. Mild mitral valve  regurgitation. No evidence of mitral stenosis.   5. Tricuspid valve regurgitation is moderate to severe.   6. The aortic valve is normal in structure.Trileaflet. Aortic valve  regurgitation is not visualized. Mild aortic valve sclerosis is present,  with no evidence of aortic valve stenosis.   7. The inferior vena cava is normal in size with <50% respiratory  variability, suggesting right atrial pressure of 8 mmHg.    Echo 2021  1. Left ventricular ejection fraction, by estimation, is 60 to 65%. The  left ventricle has normal function. The left ventricle has no regional  wall motion abnormalities. Left ventricular diastolic parameters were  normal.   2. Right ventricular systolic function is normal. The right ventricular  size is normal. There is mildly elevated pulmonary artery systolic  pressure. The estimated right ventricular systolic pressure is 39.4 mmHg.   3. Left atrial size was mildly dilated.   4. Tricuspid valve regurgitation is mild to moderate.    MPI 09/2018   Narrative & Impression  There was no ST segment deviation noted during stress. The study is normal. This is a low risk study. The left ventricular ejection fraction is hyperdynamic (>65%).    Echo 10/2016 Left ventricle: The cavity size was normal. Systolic function was    normal. The estimated ejection fraction was in the range of 60%    to 65%. Wall motion was normal; there were no regional wall    motion abnormalities. Doppler parameters are consistent with    abnormal left ventricular relaxation (grade 1 diastolic    dysfunction).  - Mitral valve: There was mild regurgitation.  - Left atrium: The atrium was normal in size.  - Right ventricle: Systolic function was normal.  - Tricuspid valve: There was mild-moderate regurgitation.  - Pulmonary arteries:  Systolic pressure was midlly elevated. PA    peak pressure: 35 mm Hg (S).  - Inferior vena cava: The vessel was normal in size. The    respirophasic diameter changes were in the normal range (>= 50%),    consistent with normal central venous pressure.   Assessment & Plan   New onset Afib - the patient presented with dizziness, palpitations and cough found to have new onset Afib and PNA started on IV diltiazem  with conversion to normal sinus rhythm - 9/4 in the PM patient decompensated requring 15L NRB. Patient converted to aFib and IV dilt was held and IV amio was started. shortly after she converted to SR and remains in SB HR 50s. She is requiring 6L O2. - CHA2DS2-VASc of 3 (age, gender) continue Eliquis  2.5 mg twice daily - Mag 1.9 and K 3.7 - continue IV amiodarone  given high risk for Afib   Multilobar PNA - Antibiotics per IM  Hyponatremia - Status post sodium chloride  500 mL liter bolus per ED - On admission LR 1L bolus one-time - Na 133   IDF - She is followed by pulmonology as outpatient    For questions or updates, please contact [facility] Please consult [email or web address] for contact info under       Signed, Minie Roadcap VEAR Fishman, PA-C

## 2024-06-08 NOTE — TOC CM/SW Note (Signed)
 Transition of Care [facility]) - Inpatient Brief Assessment   Patient Details  Name: Crystal Gregory MRN: [id number] Date of Birth: 09-09-1932  [facility] CM/SW Contact:    Lauraine JAYSON Carpen, LCSW Phone Number: 06/08/2024, 3:15 PM   Clinical Narrative: CSW reviewed chart. Patient lives at [facility] ILF. OT is recommending SNF. PT eval pending. They plan to work with her over the weekend. Will follow up with patient once recommendations are in.  Transition of Care Asessment: Insurance and Status: Insurance coverage has been reviewed Patient has primary care physician: Yes Home environment has been reviewed: Single family home Prior level of function:: Independent Prior/Current Home Services: No current home services Social Drivers of Health Review: SDOH reviewed no interventions necessary Readmission risk has been reviewed: Yes Transition of care needs: transition of care needs identified, TOC will continue to follow

## 2024-06-09 ENCOUNTER — Encounter: Payer: Self-pay | Admitting: Pulmonary Disease

## 2024-06-09 DIAGNOSIS — I4891 Unspecified atrial fibrillation: Secondary | ICD-10-CM | POA: Diagnosis not present

## 2024-06-09 MED ORDER — IPRATROPIUM-ALBUTEROL 0.5-2.5 (3) MG/3ML IN SOLN
3.0000 mL | RESPIRATORY_TRACT | Status: DC | PRN
  Administered 2024-06-09 – 2024-06-10 (×3): 3 mL via RESPIRATORY_TRACT
  Filled 2024-06-09 (×2): qty 3

## 2024-06-09 MED ORDER — AMIODARONE HCL 200 MG PO TABS: 200.0000 mg | ORAL_TABLET | Freq: Every day | ORAL | Status: DC

## 2024-06-09 MED ORDER — AMIODARONE HCL 200 MG PO TABS
200.0000 mg | ORAL_TABLET | Freq: Two times a day (BID) | ORAL | Status: DC
  Administered 2024-06-09 – 2024-06-12 (×7): 200 mg via ORAL
  Filled 2024-06-09 (×7): qty 1

## 2024-06-09 MED ORDER — CHLORHEXIDINE GLUCONATE CLOTH 2 % EX PADS
6.0000 | MEDICATED_PAD | Freq: Every day | CUTANEOUS | Status: DC
  Administered 2024-06-09 – 2024-06-12 (×4): 6 via TOPICAL

## 2024-06-09 NOTE — Plan of Care (Signed)
 Continue current regimen of ABX and steroids and NEB therapy Continue cardiology recommendations NO further recs at this time  Will sign off. Please contact me with any questions.    Nickolas Alm Cellar, M.D.  [facility]  Medical Director [facility] Medical Director [facility] Cardio-Pulmonary Department

## 2024-06-09 NOTE — Progress Notes (Signed)
 Rounding Note   Patient Name: Crystal Gregory Date of Encounter: 06/09/2024  [facility] Cardiologist: Evalene Lunger, MD   Subjective Feeling tired after her walk.  Scheduled Meds:  apixaban   2.5 mg Oral BID   budesonide  (PULMICORT ) nebulizer solution  0.5 mg Nebulization BID   Chlorhexidine  Gluconate Cloth  6 each Topical Daily   feeding supplement  237 mL Oral BID BM   gabapentin   100 mg Oral QHS   guaiFENesin   600 mg Oral BID   ipratropium-albuterol   3 mL Nebulization TID   multivitamin with minerals  1 tablet Oral Daily   pantoprazole   40 mg Oral Daily   scopolamine   1 patch Transdermal Q72H   Continuous Infusions:  amiodarone  30 mg/hr (06/08/24 1727)   ceFEPime  (MAXIPIME ) IV 2 g (06/09/24 9391)   doxycycline  (VIBRAMYCIN ) IV 100 mg (06/09/24 1019)   PRN Meds: acetaminophen  **OR** acetaminophen , diphenhydrAMINE , ipratropium-albuterol , LORazepam , morphine  injection, ondansetron  **OR** ondansetron  (ZOFRAN ) IV, oxyCODONE , traZODone    Vital Signs  Vitals:   06/09/24 0400 06/09/24 0637 06/09/24 0823 06/09/24 1112  BP:   (!) 125/46 (!) 137/58  Pulse:      Resp:      Temp: 98 F (36.7 C)  97.8 F (36.6 C) 97.9 F (36.6 C)  TempSrc:   Oral Oral  SpO2:  93%    Weight:      Height:        Intake/Output Summary (Last 24 hours) at 06/09/2024 1218 Last data filed at 06/09/2024 1029 Gross per 24 hour  Intake 1828.62 ml  Output --  Net 1828.62 ml      06/05/2024   12:54 PM 05/24/2024   10:24 AM 05/03/2024   10:07 AM  Last 3 Weights  Weight (lbs) 108 lb 102 lb 99 lb 9.6 oz  Weight (kg) 48.988 kg 46.267 kg 45.178 kg      Telemetry Sinus rhythm.  No events.- Personally Reviewed  ECG  N/a- Personally Reviewed  Physical Exam  VS:  BP (!) 137/58 (BP Location: Left Arm)   Pulse 77   Temp 97.9 F (36.6 C) (Oral)   Resp (!) 22   Ht 5' 8 (1.727 m)   Wt 49 kg   SpO2 93%   BMI 16.42 kg/m  , BMI Body mass index is 16.42 kg/m. GENERAL: Frail.  No acute  distress. HEENT: Pupils equal round and reactive, fundi not visualized, oral mucosa unremarkable NECK:  No jugular venous distention, waveform within normal limits, carotid upstroke brisk and symmetric, no bruits, no thyromegaly LUNGS:  Clear to auscultation bilaterally HEART:  RRR.  PMI not displaced or sustained,S1 and S2 within normal limits, no S3, no S4, no clicks, no rubs, no murmurs ABD:  Flat, positive bowel sounds normal in frequency in pitch, no bruits, no rebound, no guarding, no midline pulsatile mass, no hepatomegaly, no splenomegaly EXT:  2 plus pulses throughout, no edema, no cyanosis no clubbing SKIN:  No rashes no nodules NEURO:  Cranial nerves II through XII grossly intact, motor grossly intact throughout PSYCH:  Cognitively intact, oriented to person place and time   Labs High Sensitivity Troponin:   Recent Labs  Lab 06/05/24 1258 06/07/24 1239  TROPONINIHS 15 18*     Chemistry Recent Labs  Lab 06/05/24 1258 06/06/24 0517 06/06/24 0617 06/07/24 1239 06/08/24 0635  NA 127* 132*  --  129* 133*  K 4.1 3.7  --  4.1 3.7  CL 93* 98  --  94* 93*  CO2 23 27  --  25 30  GLUCOSE 109* 150*  --  119* 127*  BUN 14 13  --  10 12  CREATININE 0.44 0.50  --  0.71 0.47  CALCIUM 7.8* 7.8*  --  7.8* 7.5*  MG 1.8  --  1.9 2.0 1.8  PROT 6.4*  --   --   --   --   ALBUMIN 2.4*  --   --   --   --   AST 23  --   --   --   --   ALT 17  --   --   --   --   ALKPHOS 110  --   --   --   --   BILITOT 0.9  --   --   --   --   GFRNONAA >60 >60  --  >60 >60  ANIONGAP 11 7  --  10 10    Lipids No results for input(s): CHOL, TRIG, HDL, LABVLDL, LDLCALC, CHOLHDL in the last 168 hours.  Hematology Recent Labs  Lab 06/06/24 0517 06/07/24 1239 06/08/24 0635  WBC 8.5 12.7* 11.4*  RBC 2.63* 2.85* 2.30*  HGB 9.0* 9.8* 8.0*  HCT 27.2* 29.1* 23.4*  MCV 103.4* 102.1* 101.7*  MCH 34.2* 34.4* 34.8*  MCHC 33.1 33.7 34.2  RDW 15.1 14.9 14.6  PLT 319 465* 342   Thyroid  No  results for input(s): TSH, FREET4 in the last 168 hours.  BNP Recent Labs  Lab 06/05/24 1258  BNP 383.8*    DDimer  Recent Labs  Lab 06/05/24 1258  DDIMER 5.43*     Radiology  CT Angio Chest Pulmonary Embolism (PE) W or WO Contrast Result Date: 06/07/2024 CLINICAL DATA:  Pulmonary embolism suspected high probability. History of interstitial lung disease presenting with cough and dizziness EXAM: CT ANGIOGRAPHY CHEST WITH CONTRAST TECHNIQUE: Multidetector CT imaging of the chest was performed using the standard protocol during bolus administration of intravenous contrast. Multiplanar CT image reconstructions and MIPs were obtained to evaluate the vascular anatomy. RADIATION DOSE REDUCTION: This exam was performed according to the departmental dose-optimization program which includes automated exposure control, adjustment of the mA and/or kV according to patient size and/or use of iterative reconstruction technique. CONTRAST:  75mL OMNIPAQUE  IOHEXOL  350 MG/ML SOLN COMPARISON:  Same day chest radiograph and prior studies FINDINGS: Cardiovascular: Motion artifact and contrast bolus timing limits evaluation specifically of the bilateral lower lobe pulmonary artery branches. No definite pulmonary embolism identified elsewhere. Mediastinum/Nodes: Redemonstration of marked dilation of the proximal esophagus. Unchanged mildly enlarged mediastinal lymph nodes with a right paratracheal node measuring up to 1.0 cm in short axis, possibly reactive. Lungs/Pleura: Extensive bibasilar airspace disease with slightly increased consolidation in the bilateral lower lobes. Extensive bronchiectasis at the bilateral lower lobes with peripheral reticular opacities throughout the lungs, similar to prior. Small right and trace left pleural effusion. Upper Abdomen: No acute findings. Musculoskeletal: No acute osseous findings. Review of the MIP images confirms the above findings. IMPRESSION: 1. Limited examination with  nondiagnostic evaluation of the bilateral lower lobe pulmonary artery branches due to motion artifact and contrast bolus timing. However, no definite pulmonary embolism identified in the remaining pulmonary arteries bilaterally. 2. Extensive lower lobe predominant pulmonary opacities bilaterally, slightly increased compared to prior and possibly related infection. 3.  Small right and trace left pleural effusions. Aortic Atherosclerosis (ICD10-I70.0). Electronically Signed   By: Michaeline Blanch M.D.   On: 06/07/2024 15:52   US  EKG SITE RITE Result Date: 06/07/2024 If [facility]  image not attached, placement could not be confirmed due to current cardiac rhythm.  DG Chest Port 1 View Result Date: 06/07/2024 CLINICAL DATA:  Shortness of breath. EXAM: PORTABLE CHEST 1 VIEW COMPARISON:  June 05, 2024. FINDINGS: The heart size and mediastinal contours are within normal limits. Stable bibasilar infiltrates or edema is noted with stable right pleural effusion. The visualized skeletal structures are unremarkable. IMPRESSION: Stable bibasilar infiltrates or edema is noted with stable right pleural effusion. Electronically Signed   By: Lynwood Landy Raddle M.D.   On: 06/07/2024 12:58    Cardiac Studies Echo 06/06/24: 1. Left ventricular ejection fraction, by estimation, is 60 to 65%. The  left ventricle has normal function. The left ventricle has no regional  wall motion abnormalities. Left ventricular diastolic parameters are  consistent with Grade I diastolic  dysfunction (impaired relaxation). The average left ventricular global  longitudinal strain is -14.1 %.   2. Right ventricular systolic function is normal. The right ventricular  size is normal.   3. The mitral valve is normal in structure. Mild to moderate mitral valve  regurgitation. No evidence of mitral stenosis.   4. The aortic valve is normal in structure. Aortic valve regurgitation is  not visualized. Aortic valve sclerosis is present, with no  evidence of  aortic valve stenosis.   5. The inferior vena cava is normal in size with greater than 50%  respiratory variability, suggesting right atrial pressure of 3 mmHg.   Patient Profile   89 y.o. female with interstitial lung disease, pulmonary fibrosis, compression fractures, bronchiectasis admitted with pneumonia.  Cardiology consulted for atrial fibrillation with RVR.  Assessment & Plan   # Atrial fibrillation with RVR: New onset this admission.  Fortunately she has converted back to sinus rhythm.  We will stop the IV amiodarone  and transition her to oral.  Given her underlying history of interstitial lung disease I do not think it is a good medicine for her long-term.  Recommend treating for 1 to 2 months.  Will switch to 200 mg twice daily for 2 weeks then 200 mg daily.  Recommend stopping as an outpatient and 6 to 8 weeks echo this admission revealed normal systolic function\.  Continue Eliquis .   [facility] will sign off.   The patient is not ready for discharge. Medication Recommendations: Transition to oral amiodarone  for 6 to 8 weeks Other recommendations (labs, testing, etc):  n/a Follow up as an outpatient:  we will arrange For questions or updates, please contact [facility] Please consult [email or web address] for contact info under     Signed, Annabella Scarce, MD  06/09/2024, 12:18 PM

## 2024-06-09 NOTE — Plan of Care (Signed)
  Problem: Activity: Goal: Risk for activity intolerance will decrease Outcome: Progressing   Problem: Nutrition: Goal: Adequate nutrition will be maintained Outcome: Progressing   Problem: Coping: Goal: Level of anxiety will decrease Outcome: Progressing   Problem: Pain Managment: Goal: General experience of comfort will improve and/or be controlled Outcome: Progressing

## 2024-06-09 NOTE — Progress Notes (Signed)
 Progress Note   Patient: Crystal Gregory FMW:[id number] DOB: 02-24-1932 DOA: 06/05/2024     4 DOS: the patient was seen and examined on 06/09/2024   Brief hospital course: [name] is a 89 year old female with history of interstitial lung disease, vertigo, history of nontraumatic compression fracture, rheumatoid arthritis, polyarthralgia, bronchiectasis, idiopathic pulmonary fibrosis, insomnia, GERD, who presents emergency department for chief concerns of dizziness, productive cough for about 2 weeks.  Vitals in the ED showed t of 98.5, rr 18, hr 98, blood pressure 114/68, SpO2 96% on room air.  Serum sodium is 127, potassium 4.1, chloride 93, bicarb 23, BUN 14, serum creatinine 0.44, EGFR greater than 60, nonfasting blood glucose 109, WBC 10.6, hemoglobin 9.5, platelets of 356.  BNP is 383.8, hs troponin is 15, Pro-Cal is 0.16, lactic acid 1.2, D-dimer 5.43, COVID/influenza A/influenza B/RSV PCR were negative.  ED treatment: Acetaminophen  1000 mg p.o. one-time dose, diltiazem  bolus and gtt., ceftriaxone  1 g IV one-time dose, doxycycline  100 mg IV, sodium chloride  500 mL bolus.  Assessment and Plan: # Acute hypoxic respiratory failure  # Multilobar lung infiltrate Bibasilar Community-acquired pneumonia Rhino Virus-Continue supportive care Continue with doxycycline  100 mg twice daily S/p Ceftriaxone  2 g IV daily Mucinex  600 mg p.o. twice daily, Tussionex prn  Patient was started on cefepime , Zyvox , steroids and breathing treatments  due to worsening pneumonia as per pulm consult. Palliative care was involved, patient opted for DNR/DNI but continue current treatment. Clinically feels better today compared to yesterday. She was on HFNC On room air  during my encounter.       # Atrial fibrillation with RVR (HCC) Rate controlled with amiodarone  gtt CHA2DS2VASc = 3 (age > 66, gender: Female) Eliquis  2.5 mg p.o. twice daily initiated Consulted cardiology   # Isotonic  hyponatremia S/p Nacl IVF 500 mL liter bolus per EDP On admission LR 1 L bolus x 1 given  Serum osmolality 278 within normal range Started fluid restriction for fluid overloaded     # Vertigo Patient states the meclizine  no longer works Resume to lorazepam  0.5 mg every 8 hours as needed for vertigo CT head imaging was considered however I noted that on 05/08/2024, patient had MRI of the brain with and without contrast outpatient for dizziness, nausea, vertigo symptoms for at least 6 weeks MRI brain w wo contrast: read as no acute intracranial abnormality.  Age-related atrophy and moderate periventricular and subcortical white matter disease.  Complete opacification of left maxillary sinus. PT consulted for Epley's maneuver. Started scopolamine  patch as per patient it helps, discontinued meclizine , patient never had any benefit with meclizine  in the past.   # IPF (idiopathic pulmonary fibrosis) Continue outpatient follow-up with pulmonologist   # Insomnia Gabapentin  100 mg daily resumed Trazodone  50 mg nightly as needed for sleep ordered   # GERD, continue PPI # Rheumatoid arthritis, at home patient is on prednisone  3 mg p.o. daily home dose.  Resume on discharge.  Currently patient is on IV Solu-Medrol .     #Severe Malnutrition related to chronic illness (pulmonary fibrosis) as evidenced by moderate fat depletion, severe fat depletion, moderate muscle depletion, severe muscle depletion.   Liberalize diet to regular with 1.5 L fluid restriction for widest variety of meal selections -Ensure Plus High Protein po BID, each supplement provides 350 kcal and 20 grams of protein -MVI with minerals daily   Body mass index is 16.42 kg/m.     Diet: Heart Healthy DVT Prophylaxis: Eliquis    Advance goals of care  discussion: DNR/DNI-limited   Family Communication: family was present at bedside, at the time of interview.  The pt provided permission to discuss medical plan with the family.  Opportunity was given to ask question and all questions were answered satisfactorily.    Disposition:  Pt is from ALF [facility], admitted with PNA nad Afib, still has Resp failure and on Iv meds, which precludes a safe discharge. Discharge to ALF, when stable and cleared by cards, most likely 3 days.        Subjective: Sitted on recliner, feels better, had coughing spells last night. She wants to go to SNF prior to ALF  Physical Exam: Vitals:   06/09/24 0000 06/09/24 0400 06/09/24 0637 06/09/24 0823  BP: (!) 123/57     Pulse: 77     Resp: (!) 22     Temp: 98.1 F (36.7 C) 98 F (36.7 C)  97.8 F (36.6 C)  TempSrc: Oral   Oral  SpO2: 96%  93%   Weight:      Height:       General: Calm in no acute distress Eyes: PERRLA ENT: Oral Mucosa Clear, moist  Neck: no JVD,  Cardiovascular: S1 and S2 Present, no Murmur,  Respiratory: Diminished breath sounds, on 5L of oxygen . Abdomen: BS present, Soft and no tenderness,  Skin: no rashes Extremities: no Pedal edema, no calf tenderness Neurologic: without any new focal findings Data Reviewed:   Disposition: Status is: Inpatient   Planned Discharge Destination: Skilled nursing facility    Time spent: 35 minutes  Author: Landon FORBES Baller, MD 06/09/2024 8:55 AM  For on call review [email or web address].

## 2024-06-09 NOTE — Evaluation (Signed)
 Physical Therapy Evaluation Patient Details Name: Crystal Gregory MRN: [id number] DOB: [birth year] Today's Date: 06/09/2024  History of Present Illness  Pt is a 89 year old female who presents to ED for chief concerns of dizziness, productive cough for about 2 weeks. MD assessment includes: A-fib with RVR, multilobar lung infiltrate and hyponatremia. PMH of interstitial lung disease, vertigo, history of nontraumatic compression fracture, rheumatoid arthritis, polyarthralgia, bronchiectasis, idiopathic pulmonary fibrosis, insomnia, GERD.  Clinical Impression  Pt sitting up finishing breakfast on arrival, much more willing to work even though she is breathing, and generally feeling, poorly.  She was able to rise to standing and do some ambulation relatively safely but remains very quick to fatigue and is far from her baseline.  She will benefit from continued PT to address functional limitations.        If plan is discharge home, recommend the following: A little help with walking and/or transfers;A little help with bathing/dressing/bathroom;Assistance with cooking/housework;Assist for transportation   Can travel by private vehicle   Yes    Equipment Recommendations  (TBD)  Recommendations for Other Services       Functional Status Assessment Patient has had a recent decline in their functional status and demonstrates the ability to make significant improvements in function in a reasonable and predictable amount of time.     Precautions / Restrictions Precautions Precautions: Fall Recall of Precautions/Restrictions: Intact Restrictions Weight Bearing Restrictions Per Provider Order: No      Mobility  Bed Mobility               General bed mobility comments: NT, seated pre and post session    Transfers Overall transfer level: Needs assistance Equipment used: Rolling walker (2 wheels) Transfers: Sit to/from Stand Sit to Stand: Min assist           General transfer  comment: Pt needing light assist to rise from standard height bed, effortful w/o heavy forward lean from recliner w/o assist    Ambulation/Gait Ambulation/Gait assistance: Contact guard assist Gait Distance (Feet): 45 Feet Assistive device: Rolling walker (2 wheels)         General Gait Details: 30 ft, then 22ft.  Slow but consistent cadence, pt very quick to fatigue - SpO2 dropped from mid 90s to high 80s on first attempt.  After multiple minute rest break and much encouargement managed to go farther on second attempt but SpO2 <85% and continued c/o significant fatigue.  Stairs            Wheelchair Mobility     Tilt Bed    Modified Rankin (Stroke Patients Only)       Balance Overall balance assessment: Needs assistance Sitting-balance support: Feet supported, Bilateral upper extremity supported Sitting balance-Leahy Scale: Good       Standing balance-Leahy Scale: Fair Standing balance comment: reliant on walker, no LOBs                             Pertinent Vitals/Pain Pain Assessment Pain Assessment: 0-10 (constant dizziness) Pain Score: 3  Pain Location: overall generalized pain and chest soreness from coughing    Home Living Family/patient expects to be discharged to:: Private residence Living Arrangements: Alone Available Help at Discharge: Family;Available PRN/intermittently Type of Home: House Home Access: Stairs to enter   [employer] of Steps: threshold   Home Layout: One level Home Equipment: Agricultural consultant (2 wheels)      Prior Function Prior Level of Function :  Independent/Modified Independent;Driving             Mobility Comments: does not utilize AD at baseline, IND; drives ADLs Comments: IND     Extremity/Trunk Assessment   Upper Extremity Assessment Upper Extremity Assessment: Generalized weakness;Overall [facility] for tasks assessed    Lower Extremity Assessment Lower Extremity Assessment: Generalized  weakness;Overall WFL for tasks assessed       Communication   Communication Communication: No apparent difficulties Factors Affecting Communication: Hearing impaired    Cognition Arousal: Alert Behavior During Therapy: WFL for tasks assessed/performed   PT - Cognitive impairments: No apparent impairments                         Following commands: Intact       Cueing       General Comments General comments (skin integrity, edema, etc.): Pt feeling much better today than prior few days, but remains far from baseline    Exercises     Assessment/Plan    PT Assessment Patient needs continued PT services  PT Problem List Decreased strength;Decreased range of motion;Decreased activity tolerance;Decreased mobility;Decreased safety awareness;Decreased knowledge of precautions;Decreased knowledge of use of DME;Decreased balance       PT Treatment Interventions DME instruction;Gait training;Functional mobility training;Therapeutic activities;Therapeutic exercise;Balance training;Neuromuscular re-education;Patient/family education    PT Goals (Current goals can be found in the Care Plan section)  Acute Rehab PT Goals Patient Stated Goal: get stronger at rehab PT Goal Formulation: With patient Time For Goal Achievement: 06/22/24 Potential to Achieve Goals: Good    Frequency Min 2X/week     Co-evaluation               AM-PAC PT 6 Clicks Mobility  Outcome Measure Help needed turning from your back to your side while in a flat bed without using bedrails?: A Little Help needed moving from lying on your back to sitting on the side of a flat bed without using bedrails?: A Little Help needed moving to and from a bed to a chair (including a wheelchair)?: A Little Help needed standing up from a chair using your arms (e.g., wheelchair or bedside chair)?: A Little Help needed to walk in hospital room?: A Little Help needed climbing 3-5 steps with a railing? : A  Little 6 Click Score: 18    End of Session Equipment Utilized During Treatment: Gait belt Activity Tolerance: Patient tolerated treatment well Patient left: with chair alarm set;with call bell/phone within reach;with family/visitor present Nurse Communication: Mobility status PT Visit Diagnosis: Muscle weakness (generalized) (M62.81);Difficulty in walking, not elsewhere classified (R26.2)    Time: 9085-9049 PT Time Calculation (min) (ACUTE ONLY): 36 min   Charges:   PT Evaluation $PT Eval Low Complexity: 1 Low PT Treatments $Gait Training: 8-22 mins PT General Charges $$ ACUTE PT VISIT: 1 Visit         Carmin JONELLE Deed, DPT 06/09/2024, 1:26 PM

## 2024-06-10 ENCOUNTER — Inpatient Hospital Stay

## 2024-06-10 DIAGNOSIS — I4891 Unspecified atrial fibrillation: Secondary | ICD-10-CM | POA: Diagnosis not present

## 2024-06-10 LAB — CULTURE, BLOOD (ROUTINE X 2)
Culture: NO GROWTH
Culture: NO GROWTH

## 2024-06-10 MED ORDER — IPRATROPIUM-ALBUTEROL 0.5-2.5 (3) MG/3ML IN SOLN
3.0000 mL | Freq: Two times a day (BID) | RESPIRATORY_TRACT | Status: DC
  Administered 2024-06-11 – 2024-06-12 (×3): 3 mL via RESPIRATORY_TRACT
  Filled 2024-06-10 (×3): qty 3

## 2024-06-10 MED ORDER — BISACODYL 5 MG PO TBEC
10.0000 mg | DELAYED_RELEASE_TABLET | Freq: Every day | ORAL | Status: DC
  Administered 2024-06-10: 10 mg via ORAL
  Filled 2024-06-10: qty 2

## 2024-06-10 MED ORDER — BISACODYL 10 MG RE SUPP: 10.0000 mg | Freq: Every day | RECTAL | Status: DC | PRN

## 2024-06-10 MED ORDER — PANTOPRAZOLE SODIUM 40 MG PO TBEC
40.0000 mg | DELAYED_RELEASE_TABLET | Freq: Two times a day (BID) | ORAL | Status: DC
  Administered 2024-06-10 – 2024-06-12 (×4): 40 mg via ORAL
  Filled 2024-06-10 (×4): qty 1

## 2024-06-10 MED ORDER — POLYETHYLENE GLYCOL 3350 17 G PO PACK
17.0000 g | PACK | Freq: Two times a day (BID) | ORAL | Status: DC
  Administered 2024-06-10 – 2024-06-12 (×4): 17 g via ORAL
  Filled 2024-06-10 (×4): qty 1

## 2024-06-10 NOTE — Progress Notes (Signed)
 Mobility Specialist - Progress Note   Pre-mobility: HR, BP, SpO2 During mobility: HR, BP, SpO2(80); recovered to 92 after 2 minute seated rest break Post-mobility: HR, BP, SPO2     06/10/24 1100  Mobility  Activity Ambulated with assistance;Stood at bedside;Dangled on edge of bed  Level of Assistance Contact guard assist, steadying assist  Assistive Device Front wheel walker  Distance Ambulated (ft) 35 ft  Range of Motion/Exercises Active  Activity Response Tolerated well  Mobility Referral Yes  Mobility visit 1 Mobility  Mobility Specialist Start Time (ACUTE ONLY) 1024  Mobility Specialist Stop Time (ACUTE ONLY) 1051  Mobility Specialist Time Calculation (min) (ACUTE ONLY) 27 min   Pt resting in bed on RA upon entry. Pt initially hesitant but pleasant and agreeable as the session progressed. Pt STS MinA and ambulates to hallway nurse station CGA with x2 seated rest breaks during session. Pt O2 dropped to 80% at 35 ft and recovered to 92% after 2 minute seated rest break. Pt endorses pain but no acute discomfort. Pt returned to recliner in room and left with needs in reach. Chair alarm activated.   Crystal Gregory Mobility Specialist 06/10/24, 11:21 AM

## 2024-06-10 NOTE — [place] FL2 (Signed)
 [place]  MEDICAID FL2 LEVEL OF CARE FORM     IDENTIFICATION  Patient Name: Crystal Gregory Birthdate: September 06, 1932 Sex: female Admission Date (Current Location): 06/05/2024  [facility] and [place] Number:  Chiropodist and Address:  [facility], [address], [name], [place] [address]      Provider Number: [id number]  Attending Physician Name and Address:  Von Bellis, MD  Relative Name and Phone Number:       Current Level of Care:   Recommended Level of Care: Skilled Nursing Facility Prior Approval Number:    Date Approved/Denied:   PASRR Number: [id number]  Discharge Plan: SNF    Current Diagnoses: Patient Active Problem List   Diagnosis Date Noted   Protein-calorie malnutrition, severe 06/07/2024   Atrial fibrillation with rapid ventricular response (HCC) 06/05/2024   Hyponatremia 06/05/2024   Multilobar lung infiltrate 06/05/2024   Vertigo 01/25/2024   Elevated brain natriuretic peptide (BNP) level 01/29/2022   Diastolic dysfunction with acute on chronic heart failure (HCC) 01/29/2022   Baker's cyst, right 01/14/2022   Asymptomatic varicose veins of both lower extremities 01/14/2022   DOE (dyspnea on exertion) 01/14/2022   Compression fracture of thoracic spine, non-traumatic (HCC) 11/26/2021   Tremor of both hands 11/26/2021   B12 deficiency 09/02/2020   Anemia 08/06/2020   TMJ (temporomandibular joint syndrome) 03/02/2018   Pneumonia due to infectious organism 08/23/2017   RA (rheumatoid arthritis) (HCC) 07/13/2017   Polyarthralgia 04/25/2017   ILD (interstitial lung disease) (HCC) 06/18/2016   IPF (idiopathic pulmonary fibrosis) (HCC) 03/23/2016   Bronchiectasis without complication (HCC) 03/23/2016   Allergic rhinitis 03/22/2016   Anal fissure 08/18/2015   Insomnia 08/18/2015   Osteoporosis 02/13/2015   Stress due to illness of family member 09/16/2014   Hiatal hernia 10/13/2012   GERD (gastroesophageal reflux  disease)    Allergy     Orientation RESPIRATION BLADDER Height & Weight     Self, Time, Situation, Place  Normal Continent Weight: 49 kg Height:  5' 8 (172.7 cm)  BEHAVIORAL SYMPTOMS/MOOD NEUROLOGICAL BOWEL NUTRITION STATUS      Continent Diet (Diet regular Fluid consistency: Thin; Fluid restriction: 1500 mL Fluid)  AMBULATORY STATUS COMMUNICATION OF NEEDS Skin   Limited Assist Verbally Normal                       Personal Care Assistance Level of Assistance  Bathing Bathing Assistance: Limited assistance         Functional Limitations Info   (N/A)          SPECIAL CARE FACTORS FREQUENCY  PT (By licensed PT), OT (By licensed OT)     PT Frequency: 5x a wk OT Frequency: 5x a wk            Contractures Contractures Info: Not present    Additional Factors Info  Code Status Code Status Info: DNR             Current Medications (06/10/2024):  This is the current hospital active medication list Current Facility-Administered Medications  Medication Dose Route Frequency Provider Last Rate Last Admin   acetaminophen  (TYLENOL ) tablet 650 mg  650 mg Oral Q6H PRN Cox, Amy N, DO   650 mg at 06/08/24 2156   Or   acetaminophen  (TYLENOL ) suppository 650 mg  650 mg Rectal Q6H PRN Cox, Amy N, DO       amiodarone  (PACERONE ) tablet 200 mg  200 mg Oral BID Raford Riggs, MD  200 mg at 06/10/24 0920   [START ON 06/19/2024] amiodarone  (PACERONE ) tablet 200 mg  200 mg Oral Daily Raford Riggs, MD       apixaban  (ELIQUIS ) tablet 2.5 mg  2.5 mg Oral BID Cox, Amy N, DO   2.5 mg at 06/10/24 0920   bisacodyl  (DULCOLAX) EC tablet 10 mg  10 mg Oral QHS Von Bellis, MD       bisacodyl  (DULCOLAX) suppository 10 mg  10 mg Rectal Daily PRN Von Bellis, MD       budesonide  (PULMICORT ) nebulizer solution 0.5 mg  0.5 mg Nebulization BID Kasa, Kurian, MD   0.5 mg at 06/10/24 0724   ceFEPIme  (MAXIPIME ) 2 g in sodium chloride  0.9 % 100 mL IVPB  2 g Intravenous BID Von Bellis,  MD 200 mL/hr at 06/10/24 0523 2 g at 06/10/24 0523   Chlorhexidine  Gluconate Cloth 2 % PADS 6 each  6 each Topical Daily Dibia, Pauline E, MD   6 each at 06/10/24 9078   diphenhydrAMINE  (BENADRYL ) injection 12.5 mg  12.5 mg Intravenous Q6H PRN Dibia, Pauline E, MD   12.5 mg at 06/09/24 2104   feeding supplement (ENSURE PLUS HIGH PROTEIN) liquid 237 mL  237 mL Oral BID BM Von Bellis, MD   237 mL at 06/10/24 1308   gabapentin  (NEURONTIN ) capsule 100 mg  100 mg Oral QHS Cox, Amy N, DO   100 mg at 06/09/24 2104   guaiFENesin  (MUCINEX ) 12 hr tablet 600 mg  600 mg Oral BID Von Bellis, MD   600 mg at 06/09/24 1809   ipratropium-albuterol  (DUONEB) 0.5-2.5 (3) MG/3ML nebulizer solution 3 mL  3 mL Nebulization TID Kasa, Kurian, MD   3 mL at 06/10/24 1418   ipratropium-albuterol  (DUONEB) 0.5-2.5 (3) MG/3ML nebulizer solution 3 mL  3 mL Nebulization Q4H PRN Jesus America, NP   3 mL at 06/09/24 1809   LORazepam  (ATIVAN ) tablet 0.5 mg  0.5 mg Oral Q8H PRN Cox, Amy N, DO   0.5 mg at 06/10/24 1126   morphine  (PF) 2 MG/ML injection 1 mg  1 mg Intravenous Q4H PRN Von Bellis, MD   1 mg at 06/09/24 1701   multivitamin with minerals tablet 1 tablet  1 tablet Oral Daily Von Bellis, MD   1 tablet at 06/10/24 1126   ondansetron  (ZOFRAN ) tablet 4 mg  4 mg Oral Q6H PRN Cox, Amy N, DO       Or   ondansetron  (ZOFRAN ) injection 4 mg  4 mg Intravenous Q6H PRN Cox, Amy N, DO   4 mg at 06/10/24 1013   oxyCODONE  (Oxy IR/ROXICODONE ) immediate release tablet 5 mg  5 mg Oral Q6H PRN Von Bellis, MD   5 mg at 06/10/24 1126   pantoprazole  (PROTONIX ) EC tablet 40 mg  40 mg Oral BID Kumar, Dileep, MD       polyethylene glycol (MIRALAX  / GLYCOLAX ) packet 17 g  17 g Oral BID Von Bellis, MD   17 g at 06/10/24 1308   scopolamine  (TRANSDERM-SCOP) 1 MG/3DAYS 1 mg  1 patch Transdermal Q72H Von Bellis, MD   1 mg at 06/10/24 9077   traZODone  (DESYREL ) tablet 50 mg  50 mg Oral QHS PRN Cox, Amy N, DO   50 mg at 06/09/24 2103      Discharge Medications: Please see discharge summary for a list of discharge medications.  Relevant Imaging Results:  Relevant Lab Results:   Additional Information SS#[id number]  Marinda Cooks, RN

## 2024-06-10 NOTE — Progress Notes (Signed)
 Mobility Specialist - Progress Note     06/10/24 1644  Mobility  Activity Ambulated with assistance  Level of Assistance Contact guard assist, steadying assist  Assistive Device Front wheel walker  Distance Ambulated (ft) 55 ft  Range of Motion/Exercises Active  Activity Response Tolerated well  Mobility Referral Yes  Mobility visit 1 Mobility  Mobility Specialist Start Time (ACUTE ONLY) 1630  Mobility Specialist Stop Time (ACUTE ONLY) 1645  Mobility Specialist Time Calculation (min) (ACUTE ONLY) 15 min   Pt resting in bed recliner on RA upon entry. Pt STS MinA and ambulates to hallway around NS for 55 ft CGA. Pt took x1 seated rest break and small desat to 82%. Pt recovered to 92% after 1-2 minute rest break. Pt wheeled back to room and left in recliner with needs in reach. Daughter present at bedside.   Guido Rumble Mobility Specialist 06/10/24, 4:51 PM

## 2024-06-10 NOTE — TOC Progression Note (Signed)
 Transition of Care [facility]) - Progression Note    Patient Details  Name: Crystal Gregory MRN: [id number] Date of Birth: August 31, 1932  [facility]) CM/SW Contact  Marinda Cooks, RN Phone Number: 06/10/2024, 3:51 PM  Clinical Narrative:    Per PT notes pt recommended Rehab. This CM spoke with pt introduced role and discussed dc plan / recommendation . Pt agreed with plan. This CM spoke with Admission Liaison  Alfonso  at [facility] where pt resides on the ILF side  and confirmed that pt can dc to SNF/Rehab bed when medically cleared . FL2 completed referral sent in HUB . TOC will cont to follow dc planning / care coordination and update as applicable.                      Expected Discharge Plan and Services                                               Social Drivers of Health (SDOH) Interventions SDOH Screenings   Food Insecurity: No Food Insecurity (06/06/2024)  Housing: Low Risk  (06/06/2024)  Transportation Needs: No Transportation Needs (06/06/2024)  Utilities: Not At Risk (06/06/2024)  Alcohol Screen: Low Risk  (04/28/2023)  Depression (PHQ2-9): Low Risk  (12/20/2023)  Financial Resource Strain: Low Risk  (04/28/2023)  Physical Activity: Sufficiently Active (04/28/2023)  Social Connections: Moderately Isolated (06/06/2024)  Stress: No Stress Concern Present (04/28/2023)  Tobacco Use: Medium Risk (06/09/2024)  Health Literacy: Adequate Health Literacy (04/28/2023)    Readmission Risk Interventions     No data to display

## 2024-06-10 NOTE — Progress Notes (Addendum)
 Progress Note   Patient: Crystal Gregory FMW:[id number] DOB: June 25, 1932 DOA: 06/05/2024     5 DOS: the patient was seen and examined on 06/10/2024   Brief hospital course: Ms. Claudeen Leason is a 89 year old female with history of interstitial lung disease, vertigo, history of nontraumatic compression fracture, rheumatoid arthritis, polyarthralgia, bronchiectasis, idiopathic pulmonary fibrosis, insomnia, GERD, who presents emergency department for chief concerns of dizziness, productive cough for about 2 weeks.  Vitals in the ED showed t of 98.5, rr 18, hr 98, blood pressure 114/68, SpO2 96% on room air.  Serum sodium is 127, potassium 4.1, chloride 93, bicarb 23, BUN 14, serum creatinine 0.44, EGFR greater than 60, nonfasting blood glucose 109, WBC 10.6, hemoglobin 9.5, platelets of 356.  BNP is 383.8, hs troponin is 15, Pro-Cal is 0.16, lactic acid 1.2, D-dimer 5.43, COVID/influenza A/influenza B/RSV PCR were negative.  ED treatment: Acetaminophen  1000 mg p.o. one-time dose, diltiazem  bolus and gtt., ceftriaxone  1 g IV one-time dose, doxycycline  100 mg IV, sodium chloride  500 mL bolus.  Assessment and Plan:  # Acute hypoxic respiratory failure  # Multilobar lung infiltrate # Bibasilar Community-acquired pneumonia # Rhino Virus-Continue supportive care Continue with doxycycline  100 mg twice daily S/p Ceftriaxone  2 g IV daily Mucinex  600 mg p.o. twice daily, Tussionex prn  Patient was started on cefepime , Zyvox , steroids and breathing treatments  due to worsening pneumonia as per pulm consult. Palliative care was involved, patient opted for DNR/DNI but continue current treatment. On room air  during my encounter.       # Atrial fibrillation with RVR (HCC) Rate controlled with amiodarone  gtt CHA2DS2VASc = 3 (age > 38, gender: Female) Eliquis  2.5 mg p.o. twice daily initiated 9/6 transition to oral amiodarone  200 mg p.o. twice daily for 10 days followed by 20 mg p.o. daily.  As per  cardio continue amiodarone   for 1 to 2 months.  Repeat echocardiogram in 6 to 8 weeks as an outpatient as per cardio.    # Isotonic hyponatremia S/p Nacl IVF 500 mL liter bolus per EDP On admission LR 1 L bolus x 1 given  Serum osmolality 278 within normal range Started fluid restriction for fluid overloaded     # Vertigo Patient states the meclizine  no longer works Resume to lorazepam  0.5 mg every 8 hours as needed for vertigo CT head imaging was considered however I noted that on 05/08/2024, patient had MRI of the brain with and without contrast outpatient for dizziness, nausea, vertigo symptoms for at least 6 weeks MRI brain w wo contrast: read as no acute intracranial abnormality.  Age-related atrophy and moderate periventricular and subcortical white matter disease.  Complete opacification of left maxillary sinus. PT consulted for Epley's maneuver. Started scopolamine  patch as per patient it helps, discontinued meclizine , patient never had any benefit with meclizine  in the past.   # IPF (idiopathic pulmonary fibrosis) Continue outpatient follow-up with pulmonologist   # Insomnia Gabapentin  100 mg daily resumed Trazodone  50 mg nightly as needed for sleep ordered   # GERD, continue PPI 40 mg p.o. twice daily # Rheumatoid arthritis, at home patient is on prednisone  3 mg p.o. daily home dose.  Resume on discharge.  Currently patient is on IV Solu-Medrol .     #Severe Malnutrition related to chronic illness (pulmonary fibrosis) as evidenced by moderate fat depletion, severe fat depletion, moderate muscle depletion, severe muscle depletion.   Liberalize diet to regular with 1.5 L fluid restriction for widest variety of meal selections -Ensure Plus High  Protein po BID, each supplement provides 350 kcal and 20 grams of protein -MVI with minerals daily   History of constipation: Continue laxative 9/7 KUB negative for any obstruction or constipation   Body mass index is 16.42 kg/m.      Diet: Regular diet DVT Prophylaxis: Eliquis    Advance goals of care discussion: DNR/DNI-limited   Family Communication: family was present at bedside, at the time of interview.  The pt provided permission to discuss medical plan with the family. Opportunity was given to ask question and all questions were answered satisfactorily.    Disposition:  Pt is from ALF [facility], admitted with PNA nad Afib, respiratory failure resolved, still on IV antibiotics but much improved. Gradually getting better, patient can be discharged to SNF when bed will be available  Follow TOC for DC plan         Subjective: Sitted on recliner, feeling nauseous, received Zofran .  Denied any specific complaints.  Breathing is better now.   Physical Exam: Vitals:   06/10/24 0724 06/10/24 0809 06/10/24 1241 06/10/24 1418  BP:  136/67 (!) 140/75   Pulse:  86 90   Resp:   18   Temp:  98.5 F (36.9 C) 97.6 F (36.4 C)   TempSrc:  Oral    SpO2: 91% 92% 90% 91%  Weight:      Height:       General: NAD, sitting comfortably in the recliner  Eyes: PERRLA ENT: Oral Mucosa Clear, moist  Neck: no JVD,  Cardiovascular: SR, S1 and S2 audible, no Murmur,  Respiratory: Good air entry bilaterally, mild crackles, no wheezes Abdomen: BS present, Soft and no tenderness,  Skin: no rashes Extremities: no Pedal edema, no calf tenderness Neurologic: without any new focal findings Data Reviewed:   Disposition: Status is: Inpatient   Planned Discharge Destination: Skilled nursing facility    Time spent: 35 minutes  Author: Elvan Sor, MD 06/10/2024 4:39 PM  For on call review [email or web address].

## 2024-06-11 ENCOUNTER — Telehealth: Payer: Self-pay

## 2024-06-11 ENCOUNTER — Telehealth: Payer: Self-pay | Admitting: Medical

## 2024-06-11 DIAGNOSIS — I4891 Unspecified atrial fibrillation: Secondary | ICD-10-CM

## 2024-06-11 MED ORDER — METOCLOPRAMIDE HCL 5 MG/ML IJ SOLN
10.0000 mg | Freq: Once | INTRAMUSCULAR | Status: AC
  Administered 2024-06-11: 10 mg via INTRAVENOUS
  Filled 2024-06-11: qty 2

## 2024-06-11 MED ORDER — METOCLOPRAMIDE HCL 5 MG/ML IJ SOLN
5.0000 mg | Freq: Four times a day (QID) | INTRAMUSCULAR | Status: DC | PRN
  Administered 2024-06-11: 5 mg via INTRAVENOUS
  Filled 2024-06-11: qty 2

## 2024-06-11 MED ORDER — BISACODYL 5 MG PO TBEC
5.0000 mg | DELAYED_RELEASE_TABLET | Freq: Once | ORAL | Status: AC
  Administered 2024-06-11: 5 mg via ORAL
  Filled 2024-06-11: qty 1

## 2024-06-11 MED ORDER — ONDANSETRON HCL 4 MG/2ML IJ SOLN
4.0000 mg | Freq: Four times a day (QID) | INTRAMUSCULAR | Status: DC | PRN
  Administered 2024-06-11: 4 mg via INTRAVENOUS
  Filled 2024-06-11: qty 2

## 2024-06-11 MED ORDER — DIPHENHYDRAMINE HCL 12.5 MG/5ML PO ELIX
12.5000 mg | ORAL_SOLUTION | Freq: Three times a day (TID) | ORAL | Status: DC | PRN
  Administered 2024-06-11 – 2024-06-12 (×2): 12.5 mg via ORAL
  Filled 2024-06-11 (×3): qty 5

## 2024-06-11 MED ORDER — BISACODYL 10 MG RE SUPP
10.0000 mg | Freq: Once | RECTAL | Status: AC
Start: 2024-06-11 — End: 2024-06-12
  Administered 2024-06-12: 10 mg via RECTAL
  Filled 2024-06-11: qty 1

## 2024-06-11 NOTE — Telephone Encounter (Signed)
LVM to schedule appt, please schedule 

## 2024-06-11 NOTE — Plan of Care (Signed)

## 2024-06-11 NOTE — Telephone Encounter (Signed)
-----   Message from Cadence VEAR Fishman sent at 06/10/2024  7:33 AM EDT ----- Regarding: hosp follow-up Patient needs hosp follow-up in 1 month. Will need EP referral as well for afib

## 2024-06-11 NOTE — Progress Notes (Signed)
   06/11/24 1100  Mobility  Activity Ambulated with assistance;Respositioned in chair  Level of Assistance Contact guard assist, steadying assist  Assistive Device Front wheel walker  Distance Ambulated (ft) 45 ft  Range of Motion/Exercises Active Assistive;All extremities  Activity Response Tolerated well (still felt vertigo)  Mobility Referral Yes  Mobility visit 1 Mobility  Mobility Specialist Start Time (ACUTE ONLY) 1049  Mobility Specialist Stop Time (ACUTE ONLY) 1111  Mobility Specialist Time Calculation (min) (ACUTE ONLY) 22 min   Mobility Specialist - Progress Note  Pt was in bed upon arrival. Pt was still stating signs of vertigo. O2 levels maintained above 90 throughout activity. Pt ambulated within the room and was able to use the bathroom with contact guard. Pt returned to the chair while daughter held chair behind her. Chair alarm was positioned in chair and alarm was set. Needs were in reach and physician was in room upon my departure.  Clem Rodes Mobility Specialist 06/11/24, 11:35 AM

## 2024-06-11 NOTE — Telephone Encounter (Signed)
 EP referral placed and message sent to scheduling as requested   Furth, Cadence H, PA-C  P Cv Div [facility] Triage; P Cv Div [facility] Scheduling Patient needs hosp follow-up in 1 month. Will need EP referral as well for afib

## 2024-06-11 NOTE — Progress Notes (Signed)
 Progress Note   Patient: Crystal Gregory FMW:[id number] DOB: 06/05/1932 DOA: 06/05/2024     6 DOS: the patient was seen and examined on 06/11/2024   Brief hospital course: [name] is a 89 year old female with history of interstitial lung disease, vertigo, history of nontraumatic compression fracture, rheumatoid arthritis, polyarthralgia, bronchiectasis, idiopathic pulmonary fibrosis, insomnia, GERD, who presents emergency department for chief concerns of dizziness, productive cough for about 2 weeks.  Vitals in the ED showed t of 98.5, rr 18, hr 98, blood pressure 114/68, SpO2 96% on room air.  Serum sodium is 127, potassium 4.1, chloride 93, bicarb 23, BUN 14, serum creatinine 0.44, EGFR greater than 60, nonfasting blood glucose 109, WBC 10.6, hemoglobin 9.5, platelets of 356.  BNP is 383.8, hs troponin is 15, Pro-Cal is 0.16, lactic acid 1.2, D-dimer 5.43, COVID/influenza A/influenza B/RSV PCR were negative.  ED treatment: Acetaminophen  1000 mg p.o. one-time dose, diltiazem  bolus and gtt., ceftriaxone  1 g IV one-time dose, doxycycline  100 mg IV, sodium chloride  500 mL bolus.  Assessment and Plan:  # Acute hypoxic respiratory failure  # Multilobar lung infiltrate # Bibasilar Community-acquired pneumonia # Rhino Virus-Continue supportive care Continue with doxycycline  100 mg twice daily S/p Ceftriaxone  2 g IV daily Mucinex  600 mg p.o. twice daily, Tussionex prn  Patient was started on cefepime , Zyvox , steroids and breathing treatments  due to worsening pneumonia as per pulm consult. Palliative care was involved, patient opted for DNR/DNI but continue current treatment. On room air  during my encounter. 9/8 last dose of antibiotics today, patient will complete 7-day course of antibiotics    # Atrial fibrillation with RVR (HCC) Rate controlled with amiodarone  gtt CHA2DS2VASc = 3 (age > 7, gender: Female) Eliquis  2.5 mg p.o. twice daily initiated 9/6 transition to oral  amiodarone  200 mg p.o. twice daily for 10 days followed by 20 mg p.o. daily.  As per cardio continue amiodarone   for 1 to 2 months.  Repeat echocardiogram in 6 to 8 weeks as an outpatient as per cardio.    # Isotonic hyponatremia S/p Nacl IVF 500 mL liter bolus per EDP On admission LR 1 L bolus x 1 given  Serum osmolality 278 within normal range Started fluid restriction for fluid overloaded     # Vertigo Patient states the meclizine  no longer works Resume to lorazepam  0.5 mg every 8 hours as needed for vertigo CT head imaging was considered however I noted that on 05/08/2024, patient had MRI of the brain with and without contrast outpatient for dizziness, nausea, vertigo symptoms for at least 6 weeks MRI brain w wo contrast: read as no acute intracranial abnormality.  Age-related atrophy and moderate periventricular and subcortical white matter disease.  Complete opacification of left maxillary sinus. PT consulted for Epley's maneuver. Started scopolamine  patch as per patient it helps, discontinued meclizine , patient never had any benefit with meclizine  in the past. 9/8 Reglan  10 mg one-time dose given for nausea   # IPF (idiopathic pulmonary fibrosis) Continue outpatient follow-up with pulmonologist   # Insomnia Gabapentin  100 mg daily resumed Trazodone  50 mg nightly as needed for sleep ordered   # GERD, continue PPI 40 mg p.o. twice daily # Rheumatoid arthritis, at home patient is on prednisone  3 mg p.o. daily home dose.  Resume on discharge.  Currently patient is on IV Solu-Medrol .     #Severe Malnutrition related to chronic illness (pulmonary fibrosis) as evidenced by moderate fat depletion, severe fat depletion, moderate muscle depletion, severe muscle depletion.  Liberalize diet to regular with 1.5 L fluid restriction for widest variety of meal selections -Ensure Plus High Protein po BID, each supplement provides 350 kcal and 20 grams of protein -MVI with minerals daily    History of constipation: Continue laxative 9/7 KUB negative for any obstruction or constipation   Body mass index is 16.42 kg/m.     Diet: Regular diet DVT Prophylaxis: Eliquis    Advance goals of care discussion: DNR/DNI-limited   Family Communication: family was present at bedside, at the time of interview.  The pt provided permission to discuss medical plan with the family. Opportunity was given to ask question and all questions were answered satisfactorily.    Disposition:  Pt is from ALF [facility], admitted with PNA nad Afib, respiratory failure resolved, still on IV antibiotics but much improved. Gradually getting better, patient can be discharged to SNF when bed will be available  Follow TOC for DC plan         Subjective: Sitted on recliner, still has nausea, and vertigo which is chronic.  Denied any new or worsening of symptoms    Physical Exam: Vitals:   06/11/24 0757 06/11/24 0848 06/11/24 1241 06/11/24 1517  BP:  (!) 136/56 122/69 (!) 121/59  Pulse:  82 75 90  Resp:  16 16 16   Temp:  98 F (36.7 C) 97.9 F (36.6 C) 97.9 F (36.6 C)  TempSrc:      SpO2: 91% (!) 89% 93% 91%  Weight:      Height:       General: NAD, sitting comfortably in the recliner  Eyes: PERRLA ENT: Oral Mucosa Clear, moist  Neck: no JVD,  Cardiovascular: SR, S1 and S2 audible, no Murmur,  Respiratory: Good air entry bilaterally, mild crackles, no wheezes Abdomen: BS present, Soft and no tenderness,  Skin: no rashes Extremities: no Pedal edema, no calf tenderness Neurologic: without any new focal findings Data Reviewed:   Disposition: Status is: Inpatient   Planned Discharge Destination: Skilled nursing facility    Time spent: 35 minutes  Author: Elvan Sor, MD 06/11/2024 5:43 PM  For on call review [email or web address].

## 2024-06-12 DIAGNOSIS — R2681 Unsteadiness on feet: Secondary | ICD-10-CM | POA: Diagnosis not present

## 2024-06-12 DIAGNOSIS — E43 Unspecified severe protein-calorie malnutrition: Secondary | ICD-10-CM | POA: Diagnosis not present

## 2024-06-12 DIAGNOSIS — Z9981 Dependence on supplemental oxygen: Secondary | ICD-10-CM | POA: Diagnosis not present

## 2024-06-12 DIAGNOSIS — L89152 Pressure ulcer of sacral region, stage 2: Secondary | ICD-10-CM | POA: Diagnosis not present

## 2024-06-12 DIAGNOSIS — J479 Bronchiectasis, uncomplicated: Secondary | ICD-10-CM | POA: Diagnosis not present

## 2024-06-12 DIAGNOSIS — I480 Paroxysmal atrial fibrillation: Secondary | ICD-10-CM | POA: Diagnosis not present

## 2024-06-12 DIAGNOSIS — F32A Depression, unspecified: Secondary | ICD-10-CM | POA: Diagnosis not present

## 2024-06-12 DIAGNOSIS — M6281 Muscle weakness (generalized): Secondary | ICD-10-CM | POA: Diagnosis not present

## 2024-06-12 DIAGNOSIS — K219 Gastro-esophageal reflux disease without esophagitis: Secondary | ICD-10-CM | POA: Diagnosis not present

## 2024-06-12 DIAGNOSIS — R634 Abnormal weight loss: Secondary | ICD-10-CM | POA: Diagnosis not present

## 2024-06-12 DIAGNOSIS — K5900 Constipation, unspecified: Secondary | ICD-10-CM | POA: Diagnosis not present

## 2024-06-12 DIAGNOSIS — R42 Dizziness and giddiness: Secondary | ICD-10-CM | POA: Diagnosis not present

## 2024-06-12 DIAGNOSIS — J9611 Chronic respiratory failure with hypoxia: Secondary | ICD-10-CM | POA: Diagnosis not present

## 2024-06-12 DIAGNOSIS — K224 Dyskinesia of esophagus: Secondary | ICD-10-CM | POA: Diagnosis not present

## 2024-06-12 DIAGNOSIS — Z5181 Encounter for therapeutic drug level monitoring: Secondary | ICD-10-CM | POA: Diagnosis not present

## 2024-06-12 DIAGNOSIS — R239 Unspecified skin changes: Secondary | ICD-10-CM | POA: Diagnosis not present

## 2024-06-12 DIAGNOSIS — J849 Interstitial pulmonary disease, unspecified: Secondary | ICD-10-CM | POA: Diagnosis not present

## 2024-06-12 DIAGNOSIS — R627 Adult failure to thrive: Secondary | ICD-10-CM | POA: Diagnosis not present

## 2024-06-12 DIAGNOSIS — Z79899 Other long term (current) drug therapy: Secondary | ICD-10-CM | POA: Diagnosis not present

## 2024-06-12 DIAGNOSIS — F419 Anxiety disorder, unspecified: Secondary | ICD-10-CM | POA: Diagnosis not present

## 2024-06-12 DIAGNOSIS — M810 Age-related osteoporosis without current pathological fracture: Secondary | ICD-10-CM | POA: Diagnosis not present

## 2024-06-12 DIAGNOSIS — D6869 Other thrombophilia: Secondary | ICD-10-CM | POA: Diagnosis not present

## 2024-06-12 DIAGNOSIS — R2689 Other abnormalities of gait and mobility: Secondary | ICD-10-CM | POA: Diagnosis not present

## 2024-06-12 DIAGNOSIS — R251 Tremor, unspecified: Secondary | ICD-10-CM | POA: Diagnosis not present

## 2024-06-12 DIAGNOSIS — R110 Nausea: Secondary | ICD-10-CM | POA: Diagnosis not present

## 2024-06-12 DIAGNOSIS — J9601 Acute respiratory failure with hypoxia: Secondary | ICD-10-CM | POA: Diagnosis not present

## 2024-06-12 DIAGNOSIS — J84112 Idiopathic pulmonary fibrosis: Secondary | ICD-10-CM | POA: Diagnosis not present

## 2024-06-12 DIAGNOSIS — M069 Rheumatoid arthritis, unspecified: Secondary | ICD-10-CM | POA: Diagnosis not present

## 2024-06-12 DIAGNOSIS — E871 Hypo-osmolality and hyponatremia: Secondary | ICD-10-CM | POA: Diagnosis not present

## 2024-06-12 DIAGNOSIS — H353131 Nonexudative age-related macular degeneration, bilateral, early dry stage: Secondary | ICD-10-CM | POA: Diagnosis not present

## 2024-06-12 DIAGNOSIS — R0902 Hypoxemia: Secondary | ICD-10-CM | POA: Diagnosis not present

## 2024-06-12 DIAGNOSIS — J449 Chronic obstructive pulmonary disease, unspecified: Secondary | ICD-10-CM | POA: Diagnosis not present

## 2024-06-12 DIAGNOSIS — J8410 Pulmonary fibrosis, unspecified: Secondary | ICD-10-CM | POA: Diagnosis not present

## 2024-06-12 DIAGNOSIS — Q141 Congenital malformation of retina: Secondary | ICD-10-CM | POA: Diagnosis not present

## 2024-06-12 DIAGNOSIS — R636 Underweight: Secondary | ICD-10-CM | POA: Diagnosis not present

## 2024-06-12 DIAGNOSIS — I4891 Unspecified atrial fibrillation: Secondary | ICD-10-CM | POA: Diagnosis not present

## 2024-06-12 DIAGNOSIS — R053 Chronic cough: Secondary | ICD-10-CM | POA: Diagnosis not present

## 2024-06-12 DIAGNOSIS — Z961 Presence of intraocular lens: Secondary | ICD-10-CM | POA: Diagnosis not present

## 2024-06-12 DIAGNOSIS — Z681 Body mass index (BMI) 19 or less, adult: Secondary | ICD-10-CM | POA: Diagnosis not present

## 2024-06-12 DIAGNOSIS — Z741 Need for assistance with personal care: Secondary | ICD-10-CM | POA: Diagnosis not present

## 2024-06-12 DIAGNOSIS — K225 Diverticulum of esophagus, acquired: Secondary | ICD-10-CM | POA: Diagnosis not present

## 2024-06-12 DIAGNOSIS — G629 Polyneuropathy, unspecified: Secondary | ICD-10-CM | POA: Diagnosis not present

## 2024-06-12 DIAGNOSIS — G4700 Insomnia, unspecified: Secondary | ICD-10-CM | POA: Diagnosis not present

## 2024-06-12 DIAGNOSIS — D638 Anemia in other chronic diseases classified elsewhere: Secondary | ICD-10-CM | POA: Diagnosis not present

## 2024-06-12 DIAGNOSIS — H43813 Vitreous degeneration, bilateral: Secondary | ICD-10-CM | POA: Diagnosis not present

## 2024-06-12 LAB — CBC
HCT: 25.9 % — ABNORMAL LOW (ref 36.0–46.0)
Hemoglobin: 8.3 g/dL — ABNORMAL LOW (ref 12.0–15.0)
MCH: 33.5 pg (ref 26.0–34.0)
MCHC: 32 g/dL (ref 30.0–36.0)
MCV: 104.4 fL — ABNORMAL HIGH (ref 80.0–100.0)
Platelets: 343 K/uL (ref 150–400)
RBC: 2.48 MIL/uL — ABNORMAL LOW (ref 3.87–5.11)
RDW: 15 % (ref 11.5–15.5)
WBC: 8.7 K/uL (ref 4.0–10.5)
nRBC: 0 % (ref 0.0–0.2)

## 2024-06-12 LAB — BASIC METABOLIC PANEL WITH GFR
Anion gap: 8 (ref 5–15)
BUN: 17 mg/dL (ref 8–23)
CO2: 31 mmol/L (ref 22–32)
Calcium: 8.3 mg/dL — ABNORMAL LOW (ref 8.9–10.3)
Chloride: 96 mmol/L — ABNORMAL LOW (ref 98–111)
Creatinine, Ser: 0.58 mg/dL (ref 0.44–1.00)
GFR, Estimated: >60 mL/min (ref >60)
Glucose, Bld: 95 mg/dL (ref 70–99)
Potassium: 3.9 mmol/L (ref 3.5–5.1)
Sodium: 135 mmol/L (ref 135–145)

## 2024-06-12 LAB — MAGNESIUM: Magnesium: 2 mg/dL (ref 1.7–2.4)

## 2024-06-12 LAB — PHOSPHORUS: Phosphorus: 2.8 mg/dL (ref 2.5–4.6)

## 2024-06-12 MED ORDER — CYANOCOBALAMIN 500 MCG PO TABS: 500.0000 ug | ORAL_TABLET | Freq: Every day | ORAL | Status: DC

## 2024-06-12 MED ORDER — OXYCODONE HCL 5 MG PO TABS: 5.0000 mg | ORAL_TABLET | Freq: Four times a day (QID) | ORAL | 0 refills | Status: DC | PRN

## 2024-06-12 MED ORDER — BISACODYL 5 MG PO TBEC: 10.0000 mg | DELAYED_RELEASE_TABLET | Freq: Every day | ORAL | Status: DC

## 2024-06-12 MED ORDER — METOCLOPRAMIDE HCL 10 MG PO TABS
5.0000 mg | ORAL_TABLET | Freq: Three times a day (TID) | ORAL | Status: DC | PRN
  Administered 2024-06-12: 5 mg via ORAL
  Filled 2024-06-12: qty 1

## 2024-06-12 MED ORDER — AMIODARONE HCL 200 MG PO TABS: 200.0000 mg | ORAL_TABLET | Freq: Two times a day (BID) | ORAL | Status: DC

## 2024-06-12 MED ORDER — POLYETHYLENE GLYCOL 3350 17 G PO PACK: 17.0000 g | PACK | Freq: Two times a day (BID) | ORAL | Status: DC

## 2024-06-12 MED ORDER — DIPHENHYDRAMINE HCL 12.5 MG/5ML PO ELIX: 12.5000 mg | ORAL_SOLUTION | Freq: Three times a day (TID) | ORAL | Status: DC | PRN

## 2024-06-12 MED ORDER — APIXABAN 2.5 MG PO TABS: 2.5000 mg | ORAL_TABLET | Freq: Two times a day (BID) | ORAL | Status: DC

## 2024-06-12 MED ORDER — LORAZEPAM 0.5 MG PO TABS: 0.5000 mg | ORAL_TABLET | Freq: Three times a day (TID) | ORAL | 0 refills | Status: DC | PRN

## 2024-06-12 MED ORDER — METOCLOPRAMIDE HCL 5 MG PO TABS: 5.0000 mg | ORAL_TABLET | Freq: Three times a day (TID) | ORAL | Status: DC | PRN

## 2024-06-12 MED ORDER — MINERAL OIL RE ENEM: 1.0000 | ENEMA | Freq: Once | RECTAL | Status: DC

## 2024-06-12 MED ORDER — AMIODARONE HCL 200 MG PO TABS: 200.0000 mg | ORAL_TABLET | Freq: Every day | ORAL | Status: DC

## 2024-06-12 NOTE — TOC Transition Note (Signed)
 Transition of Care [facility]) - Discharge Note   Patient Details  Name: Crystal Gregory MRN: [id number] Date of Birth: Jan 24, 1932  [facility] CM/SW Contact:  Alfonso Rummer, LCSW Phone Number: 06/12/2024, 2:48 PM   Clinical Narrative:   LCSW A Figueora met with pt and daughter in room 249. Pt will transition to [facility] via family transport. Pt daughter was advise of discharge and verbalized understanding. Alfonso Novak Admission Director at [facility] advise pt room will be ready by 3pm 06/12/24. No further TOC needs identified.     Final next level of care: Skilled Nursing Facility Barriers to Discharge: Barriers Resolved   Patient Goals and CMS Choice     Choice offered to / list presented to : Patient, Adult Children      Discharge Placement                Patient to be transferred to facility by: private transport Name of family member notified: [name] was notified in person in room 249 Patient and family notified of of transfer: 06/12/24  Discharge Plan and Services Additional resources added to the After Visit Summary for                                       Social Drivers of Health (SDOH) Interventions SDOH Screenings   Food Insecurity: No Food Insecurity (06/06/2024)  Housing: Low Risk  (06/06/2024)  Transportation Needs: No Transportation Needs (06/06/2024)  Utilities: Not At Risk (06/06/2024)  Alcohol Screen: Low Risk  (04/28/2023)  Depression (PHQ2-9): Low Risk  (12/20/2023)  Financial Resource Strain: Low Risk  (04/28/2023)  Physical Activity: Sufficiently Active (04/28/2023)  Social Connections: Moderately Isolated (06/06/2024)  Stress: No Stress Concern Present (04/28/2023)  Tobacco Use: Medium Risk (06/09/2024)  Health Literacy: Adequate Health Literacy (04/28/2023)     Readmission Risk Interventions     No data to display

## 2024-06-12 NOTE — Progress Notes (Signed)
 Physical Therapy Treatment Patient Details Name: Crystal Gregory MRN: [id number] DOB: Jul 11, 1932 Today's Date: 06/12/2024   History of Present Illness Pt is a 89 year old female who presents to ED for chief concerns of dizziness, productive cough for about 2 weeks. MD assessment includes: A-fib with RVR, multilobar lung infiltrate and hyponatremia. PMH of interstitial lung disease, vertigo, history of nontraumatic compression fracture, rheumatoid arthritis, polyarthralgia, bronchiectasis, idiopathic pulmonary fibrosis, insomnia, GERD.    PT Comments  Pt resting in recliner upon PT arrival; pt's daughter present; pt reporting feeling nauseas but also reporting needing to use bathroom.  Pt stood up from recliner (up to RW) with min assist (stood for a few minutes d/t nausea) and then walked to bathroom with RW with CGA.  Pt able to have BM on toilet, stood with min assist, assist required for clothing management and peri-care, pt able to wash hands at sink with CGA, and then pt walked back to chair (NT present to give pt bath).  SOB noted with activity.  Pt's HR fluctuating mostly in 90's bpm at rest and increased up to 113 bpm with activity; SpO2 sats 96% at rest beginning of session, decreased to 89% with activity, and increased to 92% at rest end of session (all on room air).  Nurse updated on pt's status (including nausea; pt's family reports it is chronic).  Will continue to focus on strengthening, balance, activity tolerance, and progressive functional mobility during hospitalization.    If plan is discharge home, recommend the following: A little help with walking and/or transfers;A little help with bathing/dressing/bathroom;Assistance with cooking/housework;Assist for transportation   Can travel by private vehicle     Yes  Equipment Recommendations  Rolling walker (2 wheels);BSC/3in1    Recommendations for Other Services       Precautions / Restrictions Precautions Precautions:  Fall Recall of Precautions/Restrictions: Intact Precaution/Restrictions Comments: Monitor 02 and HR Restrictions Weight Bearing Restrictions Per Provider Order: No     Mobility  Bed Mobility               General bed mobility comments: Deferred (pt in recliner beginning/end of session)    Transfers Overall transfer level: Needs assistance Equipment used: Rolling walker (2 wheels) Transfers: Sit to/from Stand Sit to Stand: Min assist           General transfer comment: x1 trial standing from recliner and x1 trial standing from toilet (with use of grab bar); vc's for UE placement/positioning    Ambulation/Gait Ambulation/Gait assistance: Contact guard assist Gait Distance (Feet):  (20 feet x2 (to/from bathroom)) Assistive device: Rolling walker (2 wheels) Gait Pattern/deviations: Step-through pattern, Decreased step length - right, Decreased step length - left Gait velocity: decreased     General Gait Details: slow consistent cadence; steady with RW use; limited d/t SOB   Stairs             Wheelchair Mobility     Tilt Bed    Modified Rankin (Stroke Patients Only)       Balance Overall balance assessment: Needs assistance Sitting-balance support: No upper extremity supported, Feet supported Sitting balance-Leahy Scale: Good Sitting balance - Comments: steady reaching within BOS   Standing balance support: Single extremity supported Standing balance-Leahy Scale: Fair Standing balance comment: steady in standing with at least single UE support                            Communication Communication Communication: No apparent difficulties  Cognition Arousal: Alert Behavior During Therapy: Anxious   PT - Cognitive impairments: No apparent impairments                         Following commands: Intact      Cueing Cueing Techniques: Verbal cues  Exercises      General Comments  Nursing cleared pt for participation in  physical therapy.  Pt agreeable to PT session.  Pt's daughter present during session assisting and encouraging pt as needed.      Pertinent Vitals/Pain Pain Assessment Pain Assessment: Faces Faces Pain Scale: Hurts a little bit Pain Location: Soreness on bottom Pain Descriptors / Indicators: Sore Pain Intervention(s): Limited activity within patient's tolerance, Monitored during session, Repositioned    Home Living                          Prior Function            PT Goals (current goals can now be found in the care plan section) Acute Rehab PT Goals Patient Stated Goal: get stronger at rehab PT Goal Formulation: With patient Time For Goal Achievement: 06/22/24 Potential to Achieve Goals: Good Progress towards PT goals: Progressing toward goals    Frequency    Min 2X/week      PT Plan      Co-evaluation              AM-PAC PT 6 Clicks Mobility   Outcome Measure  Help needed turning from your back to your side while in a flat bed without using bedrails?: A Little Help needed moving from lying on your back to sitting on the side of a flat bed without using bedrails?: A Little Help needed moving to and from a bed to a chair (including a wheelchair)?: A Little Help needed standing up from a chair using your arms (e.g., wheelchair or bedside chair)?: A Little Help needed to walk in hospital room?: A Little Help needed climbing 3-5 steps with a railing? : A Little 6 Click Score: 18    End of Session Equipment Utilized During Treatment: Gait belt Activity Tolerance: Patient tolerated treatment well Patient left: in chair;with family/visitor present;Other (comment) (NT present giving pt a bath (NT to set pt up when finished)) Nurse Communication: Mobility status;Other (comment) (Pt with BM; pt's HR and SpO2 sats during session) PT Visit Diagnosis: Muscle weakness (generalized) (M62.81);Difficulty in walking, not elsewhere classified (R26.2)      Time: 9074-9051 PT Time Calculation (min) (ACUTE ONLY): 23 min  Charges:    $Therapeutic Activity: 23-37 mins PT General Charges $$ ACUTE PT VISIT: 1 Visit                     [name] Caulk, PT 06/12/24, 10:07 AM

## 2024-06-12 NOTE — Progress Notes (Signed)
   06/12/24 1300  Oxygen  Therapy  SpO2 94 %  O2 Device Room Air  Patient Activity (if Appropriate) Ambulating  Mobility  Activity Ambulated with assistance;Stood at bedside  Level of Assistance Contact guard assist, steadying assist  Assistive Device Front wheel walker  Distance Ambulated (ft) 40 ft  Range of Motion/Exercises Active Assistive  Activity Response Tolerated well  Mobility visit 1 Mobility  Mobility Specialist Start Time (ACUTE ONLY) 0118  Mobility Specialist Stop Time (ACUTE ONLY) 0137  Mobility Specialist Time Calculation (min) (ACUTE ONLY) 19 min   Mobility Specialist - Progress Note  Pt was in bed upon arrival at EOB. Pt used bathroom before activity with assistance to the bathroom. Pt ambulated well. During activity pt took a break in order to recover during activity. O2 was taking with a result of 88%. Pt recovered by taking deep breaths with shoulders back and head up. Daughter provide great encouragement towards recovery. Pt returned to the room and was positioned at EOB with needs in reach proximity.   Clem Rodes Mobility Specialist 06/12/24, 1:43 PM

## 2024-06-12 NOTE — Discharge Summary (Signed)
 [facility] Discharge Summary   Patient: Crystal Gregory FMW:[id number]  PCP: [name], NP  Date of admission: 06/05/2024   Date of discharge:  06/12/2024     Discharge Diagnoses:  Principal Problem:   Atrial fibrillation with rapid ventricular response (HCC) Active Problems:   Insomnia   IPF (idiopathic pulmonary fibrosis) (HCC)   ILD (interstitial lung disease) (HCC)   Pneumonia due to infectious organism   DOE (dyspnea on exertion)   Vertigo   Hyponatremia   Multilobar lung infiltrate   Protein-calorie malnutrition, severe   Admitted From: [facility] Disposition:  SNF   Recommendations for Outpatient Follow-up:  F/u with PCP, need to be seen by an MD in 1-2 days F/u with Cardio in 1-2 weeks Follow up LABS/TEST: Repeat lab work in 1 wk and CXR in 4 weeks   Contact information for follow-up providers     Perla Evalene PARAS, MD Follow up in 1 week(s).   Specialty: Cardiology Contact information: [address] [place] [address] [phone]         [name], NP Follow up in 1 week(s).   Specialty: Internal Medicine Contact information: [address] [place] [address] [phone]              Contact information for after-discharge care     Destination     [facility] .   Service: Skilled Nursing Contact information: [address] [place]   [address] [phone]                    Diet recommendation: Regular diet  Activity: The patient is advised to gradually reintroduce usual activities, as tolerated  Discharge Condition: stable  Code Status: DNR -Limited  History of present illness: As per the H and P dictated on admission. Hospital Course:  [name] is a 89 year old female with history of interstitial lung disease, vertigo, history of nontraumatic compression fracture, rheumatoid arthritis, polyarthralgia, bronchiectasis, idiopathic pulmonary fibrosis,  insomnia, GERD, who presents emergency department for chief concerns of dizziness, productive cough for about 2 weeks.   Vitals in the ED showed t of 98.5, rr 18, hr 98, blood pressure 114/68, SpO2 96% on room air.   Serum sodium is 127, potassium 4.1, chloride 93, bicarb 23, BUN 14, serum creatinine 0.44, EGFR greater than 60, nonfasting blood glucose 109, WBC 10.6, hemoglobin 9.5, platelets of 356.   BNP is 383.8, hs troponin is 15, Pro-Cal is 0.16, lactic acid 1.2, D-dimer 5.43, COVID/influenza A/influenza B/RSV PCR were negative.   ED treatment: Acetaminophen  1000 mg p.o. one-time dose, diltiazem  bolus and gtt., ceftriaxone  1 g IV one-time dose, doxycycline  100 mg IV, sodium chloride  500 mL bolus.   Assessment and Plan:   # Acute hypoxic respiratory failure  # Multilobar lung infiltrate # Bibasilar Community-acquired pneumonia # Rhino Virus-Continue supportive care S/p Doxycycline  100 mg twice daily and S/p Ceftriaxone  2 g IV daily S/p Mucinex  600 mg p.o. twice daily, Tussionex prn. S/p cefepime , Zyvox , steroids Respiratory failure resolved, completed antibiotic course, continue symptomatic treatment if needed.  Overall saturating well on room air, does not need any treatment.     # Atrial fibrillation with RVR: S/p Amiodarone  gtt CHA2DS2VASc = 3 (age > 17, gender: Female) Eliquis  2.5 mg p.o. twice daily initiated 9/6 transition to oral amiodarone  200 mg p.o. twice daily for 10 days followed by 20 mg p.o. daily.  As per cardio continue amiodarone   for 1  to 2 months.  Repeat echocardiogram in 6 to 8 weeks as an outpatient as per cardio.    # Isotonic hyponatremia: Resolved S/p Nacl IVF 500 mL liter bolus per EDP On admission LR 1 L bolus x 1 given  Serum osmolality 278 within normal range Sodium level 135 today, stable.  Continue fluid striction 1.5 L/day.  Repeat BMP after 1 week.     # Vertigo: Patient has chronic vertigo CT head and MRI was done in the past 6 weeks so did  not repeat any imaging.  Patient has seen ENT in the past. Discontinued meclizine  and scopolamine  patch, does not help as per patient Patient is on lorazepam  as needed, Benadryl  and Reglan  as needed for vertigo and nausea   # IPF (idiopathic pulmonary fibrosis) Continue outpatient follow-up with pulmonologist   # Insomnia: continue gabapentin  home dose Discontinued trazodone    # GERD, continue PPI 20 mg p.o. twice daily # Rheumatoid arthritis, at home patient is on prednisone  3 mg p.o. daily home dose.  Resumed on discharge.   #Severe Malnutrition related to chronic illness (pulmonary fibrosis) as evidenced by moderate fat depletion, severe fat depletion, moderate muscle depletion, severe muscle depletion.   Liberalize diet to regular with 1.5 L fluid restriction for widest variety of meal selections -Ensure Plus High Protein po BID, each supplement provides 350 kcal and 20 grams of protein -MVI with minerals daily   History of constipation: Continue laxative 9/7 KUB negative for any obstruction or constipation  Body mass index is 16.42 kg/m.  Nutrition Problem: Severe Malnutrition Etiology: chronic illness (pulmonary fibrosis) Nutrition Interventions: Interventions: Ensure Enlive (each supplement provides 350kcal and 20 grams of protein), MVI, Liberalize Diet  Pain control  - [place]  Controlled Substance Reporting System database could not be reviewed as website was not working -Oxycodone  5 mg 10 tablets prescription printed as per standard requirement - Patient was instructed, not to drive, operate heavy machinery, perform activities at heights, swimming or participation in water activities or provide baby sitting services while on Pain, Sleep and Anxiety Medications; until her outpatient Physician has advised to do so again.  - Also recommended to not to take more than prescribed Pain, Sleep and Anxiety Medications.  Patient was seen by physical therapy, who recommended  Therapy, SNF placement, which was arranged. On the day of the discharge the patient's vitals were stable, and no other acute medical condition were reported by patient. the patient was felt safe to be discharge at SNF with Therapy.  Consultants: PCCM, cardiology, palliative care Procedures: None  Discharge Exam: General: Appear in no distress; Oral Mucosa Clear, moist. Cardiovascular: S1 and S2 Present, no Murmur, Respiratory: normal respiratory effort, Bilateral Air entry present and no Crackles, no wheezes Abdomen: Bowel Sound present, Soft and no tenderness. Extremities: no Pedal edema, no calf tenderness Neurology: alert and oriented to time, place, and person affect appropriate.  Filed Weights   06/05/24 1254  Weight: 49 kg   Vitals:   06/12/24 0834 06/12/24 1142  BP: (!) 114/54 (!) 110/45  Pulse: 79 71  Resp: 16 16  Temp: (!) 97.5 F (36.4 C) 97.9 F (36.6 C)  SpO2: 96% 94%    DISCHARGE MEDICATION: Allergies as of 06/12/2024   No Known Allergies      Medication List     STOP taking these medications    amoxicillin -clavulanate 875-125 MG tablet Commonly known as: AUGMENTIN    azithromycin  250 MG tablet Commonly known as: ZITHROMAX    fluticasone  50 MCG/ACT  nasal spray Commonly known as: FLONASE    guaiFENesin -codeine  100-10 MG/5ML syrup   propranolol  10 MG tablet Commonly known as: INDERAL    traZODone  50 MG tablet Commonly known as: DESYREL        TAKE these medications    amiodarone  200 MG tablet Commonly known as: PACERONE  Take 1 tablet (200 mg total) by mouth 2 (two) times daily for 6 days.   amiodarone  200 MG tablet Commonly known as: PACERONE  Take 1 tablet (200 mg total) by mouth daily. Start taking on: June 19, 2024   apixaban  2.5 MG Tabs tablet Commonly known as: ELIQUIS  Take 1 tablet (2.5 mg total) by mouth 2 (two) times daily.   azaTHIOprine  50 MG tablet Commonly known as: IMURAN  TAKE ONE AND ONE-HALF TABLETS (75 MG) EVERY  MORNING AND 1 TABLET (50 MG) EVERY EVENING What changed:  how much to take how to take this when to take this   bisacodyl  5 MG EC tablet Commonly known as: DULCOLAX Take 2 tablets (10 mg total) by mouth at bedtime. Start taking on: June 13, 2024   CALCIUM & VIT D3 BONE HEALTH PO Take 1 Dose by mouth daily.   cyanocobalamin  500 MCG tablet Commonly known as: VITAMIN B12 Take 1 tablet (500 mcg total) by mouth daily.   denosumab  60 MG/ML Soln injection Commonly known as: PROLIA  Inject 60 mg into the skin every 6 (six) months. Administer in upper arm, thigh, or abdomen   diphenhydrAMINE  12.5 MG/5ML elixir Commonly known as: BENADRYL  Take 5 mLs (12.5 mg total) by mouth every 8 (eight) hours as needed (Vertigo).   esomeprazole  20 MG capsule Commonly known as: NexIUM  Take 1 capsule (20 mg total) by mouth 2 (two) times daily before a meal.   gabapentin  100 MG capsule Commonly known as: Neurontin  Take 2 capsules (200 mg total) by mouth at bedtime. What changed: how much to take   LORazepam  0.5 MG tablet Commonly known as: Ativan  Take 1 tablet (0.5 mg total) by mouth every 8 (eight) hours as needed (Vertigo).   metoCLOPramide  5 MG tablet Commonly known as: REGLAN  Take 1 tablet (5 mg total) by mouth every 8 (eight) hours as needed for nausea or vomiting (and Vertigo).   montelukast  10 MG tablet Commonly known as: SINGULAIR  Take 1 tablet (10 mg total) by mouth daily.   ondansetron  4 MG tablet Commonly known as: Zofran  Take 1 tablet (4 mg total) by mouth daily as needed for nausea or vomiting.   oxyCODONE  5 MG immediate release tablet Commonly known as: Oxy IR/ROXICODONE  Take 1 tablet (5 mg total) by mouth every 6 (six) hours as needed for moderate pain (pain score 4-6) or severe pain (pain score 7-10).   polyethylene glycol 17 g packet Commonly known as: MIRALAX  / GLYCOLAX  Take 17 g by mouth 2 (two) times daily.   predniSONE  1 MG tablet Commonly known as:  DELTASONE  Take 3 mg by mouth daily with breakfast.       No Known Allergies Discharge Instructions     Call MD for:  difficulty breathing, headache or visual disturbances   Complete by: As directed    Call MD for:  extreme fatigue   Complete by: As directed    Call MD for:  persistant dizziness or light-headedness   Complete by: As directed    Call MD for:  persistant nausea and vomiting   Complete by: As directed    Call MD for:  severe uncontrolled pain   Complete by: As directed  Call MD for:  temperature >100.4   Complete by: As directed    Diet general   Complete by: As directed    Discharge instructions   Complete by: As directed    F/u with PCP, need to be seen by an MD in 1-2 days F/u with Cardio in 1-2 weeks Repeat lab work in 1 wk and CXR in 4 weeks   Increase activity slowly   Complete by: As directed        The results of significant diagnostics from this hospitalization (including imaging, microbiology, ancillary and laboratory) are listed below for reference.    Significant Diagnostic Studies: DG Abd 1 View Result Date: 06/10/2024 CLINICAL DATA:  Possible fecal impaction or small bowel obstruction. EXAM: ABDOMEN - 1 VIEW COMPARISON:  None Available. FINDINGS: No abnormal bowel dilatation. Mild amount of stool seen throughout the colon. Phleboliths are noted in the pelvis. IMPRESSION: No abnormal bowel dilatation. Mild stool burden. Electronically Signed   By: Lynwood Landy Raddle M.D.   On: 06/10/2024 13:07   CT Angio Chest Pulmonary Embolism (PE) W or WO Contrast Result Date: 06/07/2024 CLINICAL DATA:  Pulmonary embolism suspected high probability. History of interstitial lung disease presenting with cough and dizziness EXAM: CT ANGIOGRAPHY CHEST WITH CONTRAST TECHNIQUE: Multidetector CT imaging of the chest was performed using the standard protocol during bolus administration of intravenous contrast. Multiplanar CT image reconstructions and MIPs were obtained to  evaluate the vascular anatomy. RADIATION DOSE REDUCTION: This exam was performed according to the departmental dose-optimization program which includes automated exposure control, adjustment of the mA and/or kV according to patient size and/or use of iterative reconstruction technique. CONTRAST:  75mL OMNIPAQUE  IOHEXOL  350 MG/ML SOLN COMPARISON:  Same day chest radiograph and prior studies FINDINGS: Cardiovascular: Motion artifact and contrast bolus timing limits evaluation specifically of the bilateral lower lobe pulmonary artery branches. No definite pulmonary embolism identified elsewhere. Mediastinum/Nodes: Redemonstration of marked dilation of the proximal esophagus. Unchanged mildly enlarged mediastinal lymph nodes with a right paratracheal node measuring up to 1.0 cm in short axis, possibly reactive. Lungs/Pleura: Extensive bibasilar airspace disease with slightly increased consolidation in the bilateral lower lobes. Extensive bronchiectasis at the bilateral lower lobes with peripheral reticular opacities throughout the lungs, similar to prior. Small right and trace left pleural effusion. Upper Abdomen: No acute findings. Musculoskeletal: No acute osseous findings. Review of the MIP images confirms the above findings. IMPRESSION: 1. Limited examination with nondiagnostic evaluation of the bilateral lower lobe pulmonary artery branches due to motion artifact and contrast bolus timing. However, no definite pulmonary embolism identified in the remaining pulmonary arteries bilaterally. 2. Extensive lower lobe predominant pulmonary opacities bilaterally, slightly increased compared to prior and possibly related infection. 3.  Small right and trace left pleural effusions. Aortic Atherosclerosis (ICD10-I70.0). Electronically Signed   By: Michaeline Blanch M.D.   On: 06/07/2024 15:52   US  EKG SITE RITE Result Date: 06/07/2024 If Site Rite image not attached, placement could not be confirmed due to current cardiac  rhythm.  DG Chest Port 1 View Result Date: 06/07/2024 CLINICAL DATA:  Shortness of breath. EXAM: PORTABLE CHEST 1 VIEW COMPARISON:  June 05, 2024. FINDINGS: The heart size and mediastinal contours are within normal limits. Stable bibasilar infiltrates or edema is noted with stable right pleural effusion. The visualized skeletal structures are unremarkable. IMPRESSION: Stable bibasilar infiltrates or edema is noted with stable right pleural effusion. Electronically Signed   By: Lynwood Landy Raddle M.D.   On: 06/07/2024 12:58  ECHOCARDIOGRAM COMPLETE Result Date: 06/06/2024    ECHOCARDIOGRAM REPORT   Patient Name:   Crystal Gregory Date of Exam: 06/06/2024 Medical Rec #:  [id number]      Height:       68.0 in Accession #:    [id number]     Weight:       108.0 lb Date of Birth:  1932-09-18      BSA:          1.573 m Patient Age:    89 years       BP:           122/50 mmHg Patient Gender: F              HR:           80 bpm. Exam Location:  [facility] Procedure: 2D Echo, Cardiac Doppler, 3D Echo, Color Doppler and Strain Analysis            (Both Spectral and Color Flow Doppler were utilized during            procedure). Indications:     Atrial Fibrillation I48.91  History:         Patient has prior history of Echocardiogram examinations, most                  recent 01/21/2022. Signs/Symptoms:Chest Pain and Dyspnea.  Sonographer:     Christopher Furnace Referring Phys:  [id number] ELVAN SOR Diagnosing Phys: Evalene Lunger MD  Sonographer Comments: Global longitudinal strain was attempted. IMPRESSIONS  1. Left ventricular ejection fraction, by estimation, is 60 to 65%. The left ventricle has normal function. The left ventricle has no regional wall motion abnormalities. Left ventricular diastolic parameters are consistent with Grade I diastolic dysfunction (impaired relaxation). The average left ventricular global longitudinal strain is -14.1 %.  2. Right ventricular systolic function is normal. The right ventricular size is normal.   3. The mitral valve is normal in structure. Mild to moderate mitral valve regurgitation. No evidence of mitral stenosis.  4. The aortic valve is normal in structure. Aortic valve regurgitation is not visualized. Aortic valve sclerosis is present, with no evidence of aortic valve stenosis.  5. The inferior vena cava is normal in size with greater than 50% respiratory variability, suggesting right atrial pressure of 3 mmHg. FINDINGS  Left Ventricle: Left ventricular ejection fraction, by estimation, is 60 to 65%. The left ventricle has normal function. The left ventricle has no regional wall motion abnormalities. The average left ventricular global longitudinal strain is -14.1 %. Strain was performed and the global longitudinal strain is indeterminate. The left ventricular internal cavity size was normal in size. There is no left ventricular hypertrophy. Left ventricular diastolic parameters are consistent with Grade I diastolic dysfunction (impaired relaxation). Right Ventricle: The right ventricular size is normal. No increase in right ventricular wall thickness. Right ventricular systolic function is normal. Left Atrium: Left atrial size was normal in size. Right Atrium: Right atrial size was normal in size. Pericardium: There is no evidence of pericardial effusion. Mitral Valve: The mitral valve is normal in structure. Mild mitral annular calcification. Mild to moderate mitral valve regurgitation. No evidence of mitral valve stenosis. MV peak gradient, 6.0 mmHg. The mean mitral valve gradient is 2.0 mmHg. Tricuspid Valve: The tricuspid valve is normal in structure. Tricuspid valve regurgitation is mild . No evidence of tricuspid stenosis. Aortic Valve: The aortic valve is normal in structure. Aortic valve regurgitation is not visualized. Aortic valve sclerosis is present,  with no evidence of aortic valve stenosis. Aortic valve mean gradient measures 3.5 mmHg. Aortic valve peak gradient measures 6.0 mmHg. Aortic  valve area, by VTI measures 3.30 cm. Pulmonic Valve: The pulmonic valve was normal in structure. Pulmonic valve regurgitation is not visualized. No evidence of pulmonic stenosis. Aorta: The aortic root is normal in size and structure. Venous: The inferior vena cava is normal in size with greater than 50% respiratory variability, suggesting right atrial pressure of 3 mmHg. IAS/Shunts: No atrial level shunt detected by color flow Doppler. Additional Comments: 3D was performed not requiring image post processing on an independent workstation and was indeterminate.  LEFT VENTRICLE PLAX 2D LVIDd:         2.60 cm   Diastology LVIDs:         1.90 cm   LV e' medial:    8.27 cm/s LV PW:         1.20 cm   LV E/e' medial:  10.1 LV IVS:        1.40 cm   LV e' lateral:   6.53 cm/s LVOT diam:     1.90 cm   LV E/e' lateral: 12.8 LV SV:         76 LV SV Index:   48        2D Longitudinal Strain LVOT Area:     2.84 cm  2D Strain GLS Avg:     -14.1 %  RIGHT VENTRICLE RV Basal diam:  3.70 cm RV Mid diam:    1.80 cm RV S prime:     13.60 cm/s TAPSE (M-mode): 2.1 cm LEFT ATRIUM           Index        RIGHT ATRIUM           Index LA diam:      3.00 cm 1.91 cm/m   RA Area:     13.50 cm LA Vol (A4C): 52.5 ml 33.38 ml/m  RA Volume:   32.00 ml  20.35 ml/m  AORTIC VALVE AV Area (Vmax):    2.89 cm AV Area (Vmean):   2.61 cm AV Area (VTI):     3.30 cm AV Vmax:           122.50 cm/s AV Vmean:          88.100 cm/s AV VTI:            0.230 m AV Peak Grad:      6.0 mmHg AV Mean Grad:      3.5 mmHg LVOT Vmax:         125.00 cm/s LVOT Vmean:        81.100 cm/s LVOT VTI:          0.268 m LVOT/AV VTI ratio: 1.16  AORTA Ao Root diam: 3.20 cm MITRAL VALVE                TRICUSPID VALVE MV Area (PHT): 3.60 cm     TR Peak grad:   33.6 mmHg MV Area VTI:   2.87 cm     TR Vmax:        290.00 cm/s MV Peak grad:  6.0 mmHg MV Mean grad:  2.0 mmHg     SHUNTS MV Vmax:       1.22 m/s     Systemic VTI:  0.27 m MV Vmean:      69.8 cm/s    Systemic Diam:  1.90 cm MV Decel Time: 211 msec  MV E velocity: 83.80 cm/s MV A velocity: 121.00 cm/s MV E/A ratio:  0.69 Evalene Lunger MD Electronically signed by Evalene Lunger MD Signature Date/Time: 06/06/2024/2:10:32 PM    Final    US  Venous Img Lower Bilateral Result Date: 06/05/2024 CLINICAL DATA:  elevated d-dimer EXAM: BILATERAL LOWER EXTREMITY VENOUS DOPPLER ULTRASOUND TECHNIQUE: Gray-scale sonography with graded compression, as well as color Doppler and duplex ultrasound were performed to evaluate the lower extremity deep venous systems from the level of the common femoral vein and including the common femoral, femoral, profunda femoral, popliteal and calf veins including the posterior tibial, peroneal and gastrocnemius veins when visible. The superficial great saphenous vein was also interrogated. Spectral Doppler was utilized to evaluate flow at rest and with distal augmentation maneuvers in the common femoral, femoral and popliteal veins. COMPARISON:  January 12, 2022 FINDINGS: RIGHT LOWER EXTREMITY Common Femoral Vein: No evidence of thrombus. Normal compressibility, respiratory phasicity and response to augmentation. Saphenofemoral Junction: No evidence of thrombus. Normal compressibility and flow on color Doppler imaging. Profunda Femoral Vein: No evidence of thrombus. Normal compressibility and flow on color Doppler imaging. Femoral Vein: No evidence of thrombus. Normal compressibility, respiratory phasicity and response to augmentation. Popliteal Vein: No evidence of thrombus. Normal compressibility, respiratory phasicity and response to augmentation. Calf Veins: No evidence of thrombus. Normal compressibility and flow on color Doppler imaging. Superficial Great Saphenous Vein: No evidence of thrombus. Normal compressibility. Other Findings:  The small saphenous vein is thrombosed. LEFT LOWER EXTREMITY Common Femoral Vein: No evidence of thrombus. Normal compressibility, respiratory phasicity and response to  augmentation. Saphenofemoral Junction: No evidence of thrombus. Normal compressibility and flow on color Doppler imaging. Profunda Femoral Vein: No evidence of thrombus. Normal compressibility and flow on color Doppler imaging. Femoral Vein: No evidence of thrombus. Normal compressibility, respiratory phasicity and response to augmentation. Popliteal Vein: No evidence of thrombus. Normal compressibility, respiratory phasicity and response to augmentation. Calf Veins: No evidence of thrombus. Normal compressibility and flow on color Doppler imaging. Superficial Great Saphenous Vein: No evidence of thrombus. Normal compressibility. Other Findings: Likely popliteal cysts bilaterally, measuring up to 3.5 cm on the right. IMPRESSION: 1. Negative for deep venous thrombosis within both legs. 2. Positive for superficial venous thrombosis in the small saphenous vein on the right. Electronically Signed   By: Rogelia Myers M.D.   On: 06/05/2024 18:32   CT Angio Chest PE W and/or Wo Contrast Result Date: 06/05/2024 CLINICAL DATA:  Elevated D-dimer.  Concern for pulmonary embolism EXAM: CT ANGIOGRAPHY CHEST WITH CONTRAST TECHNIQUE: Multidetector CT imaging of the chest was performed using the standard protocol during bolus administration of intravenous contrast. Multiplanar CT image reconstructions and MIPs were obtained to evaluate the vascular anatomy. RADIATION DOSE REDUCTION: This exam was performed according to the departmental dose-optimization program which includes automated exposure control, adjustment of the mA and/or kV according to patient size and/or use of iterative reconstruction technique. CONTRAST:  75mL OMNIPAQUE  IOHEXOL  350 MG/ML SOLN COMPARISON:  Chest CT 05/05/2021 FINDINGS: Cardiovascular: No filling defects within the pulmonary arteries to suggest acute pulmonary embolism. Mediastinum/Nodes: Marked dilatation the proximal esophagus. The esophagus above the carina measures 5.4 cm in diameter. There is  gas filled esophagus and GE junction. The more distal esophagus is normal caliber at 1.2 cm. No fluid esophagus. No food stuff esophagus. Esophageal dilatation is similar tube of progressed from CT 2022. No mediastinal adenopathy.  Trachea normal Lungs/Pleura: There is dense bibasilar airspace disease airspace disease extends into the lingula and RIGHT  middle lobe. Six dense of bronchiectasis in the lower lobes. Upper Abdomen: Limited view of the liver, kidneys, pancreas are unremarkable. Normal adrenal glands. Musculoskeletal: Very little body fat. No aggressive osseous lesion compression fracture of the lower thoracic spine appears chronic. No acute fracture Review of the MIP images confirms the above findings. IMPRESSION: 1. No evidence acute pulmonary embolism. 2. Dense bibasilar airspace disease with bronchiectasis. Findings concerning severe bibasilar pneumonia. 3. Marked dilatation of the proximal esophagus. No fluid in the esophagus. No evidence of esophageal obstruction. 4. Very little body fat. Electronically Signed   By: Jackquline Boxer M.D.   On: 06/05/2024 17:01   DG Chest 1 View Result Date: 06/05/2024 CLINICAL DATA:  Dizziness, productive cough, COPD; evaluate for pleural edema EXAM: CHEST  1 VIEW COMPARISON:  November 04, 2023 May 05, 2021 FINDINGS: Biapical pleuroparenchymal scarring. Chronic changes of interstitial lung disease again noted. Increasing patchy airspace opacities in the right mid and left lower lung zones. No pneumothorax. Chronic blunting of the right costophrenic sulcus, unchanged. No cardiomegaly. Aortic atherosclerosis. No acute fracture or destructive lesions. Multilevel thoracic osteophytosis. Osteopenia. IMPRESSION: Underlying changes of chronic interstitial lung disease again noted. Increasing patchy airspace opacities in the right mid and left lower lung zones, worrisome for superimposed bronchopneumonia. Electronically Signed   By: Rogelia Myers M.D.   On: 06/05/2024  14:24    Microbiology: Recent Results (from the past 240 hours)  Resp panel by RT-PCR (RSV, Flu A&B, Covid) Anterior Nasal Swab     Status: None   Collection Time: 06/05/24 12:58 PM   Specimen: Anterior Nasal Swab  Result Value Ref Range Status   SARS Coronavirus 2 by RT PCR NEGATIVE NEGATIVE Final    Comment: (NOTE) SARS-CoV-2 target nucleic acids are NOT DETECTED.  The SARS-CoV-2 RNA is generally detectable in upper respiratory specimens during the acute phase of infection. The lowest concentration of SARS-CoV-2 viral copies this assay can detect is 138 copies/mL. A negative result does not preclude SARS-Cov-2 infection and should not be used as the sole basis for treatment or other patient management decisions. A negative result may occur with  improper specimen collection/handling, submission of specimen other than nasopharyngeal swab, presence of viral mutation(s) within the areas targeted by this assay, and inadequate number of viral copies(<138 copies/mL). A negative result must be combined with clinical observations, patient history, and epidemiological information. The expected result is Negative.  Fact Sheet for Patients:  BloggerCourse.com  Fact Sheet for Healthcare Providers:  SeriousBroker.it  This test is no t yet approved or cleared by the [place]  FDA and  has been authorized for detection and/or diagnosis of SARS-CoV-2 by FDA under an Emergency Use Authorization (EUA). This EUA will remain  in effect (meaning this test can be used) for the duration of the COVID-19 declaration under Section 564(b)(1) of the Act, 21 U.S.C.section 360bbb-3(b)(1), unless the authorization is terminated  or revoked sooner.       Influenza A by PCR NEGATIVE NEGATIVE Final   Influenza B by PCR NEGATIVE NEGATIVE Final    Comment: (NOTE) The Xpert Xpress SARS-CoV-2/FLU/RSV plus assay is intended as an aid in the diagnosis of  influenza from Nasopharyngeal swab specimens and should not be used as a sole basis for treatment. Nasal washings and aspirates are unacceptable for Xpert Xpress SARS-CoV-2/FLU/RSV testing.  Fact Sheet for Patients: BloggerCourse.com  Fact Sheet for Healthcare Providers: SeriousBroker.it  This test is not yet approved or cleared by the [place]  FDA and has been  authorized for detection and/or diagnosis of SARS-CoV-2 by FDA under an Emergency Use Authorization (EUA). This EUA will remain in effect (meaning this test can be used) for the duration of the COVID-19 declaration under Section 564(b)(1) of the Act, 21 U.S.C. section 360bbb-3(b)(1), unless the authorization is terminated or revoked.     Resp Syncytial Virus by PCR NEGATIVE NEGATIVE Final    Comment: (NOTE) Fact Sheet for Patients: BloggerCourse.com  Fact Sheet for Healthcare Providers: SeriousBroker.it  This test is not yet approved or cleared by the [place]  FDA and has been authorized for detection and/or diagnosis of SARS-CoV-2 by FDA under an Emergency Use Authorization (EUA). This EUA will remain in effect (meaning this test can be used) for the duration of the COVID-19 declaration under Section 564(b)(1) of the Act, 21 U.S.C. section 360bbb-3(b)(1), unless the authorization is terminated or revoked.  Performed at [facility], [address] [place], [place] [address]   Blood culture (routine x 2)     Status: None   Collection Time: 06/05/24 12:58 PM   Specimen: BLOOD  Result Value Ref Range Status   Specimen Description BLOOD BLOOD LEFT FOREARM  Final   Special Requests   Final    BOTTLES DRAWN AEROBIC AND ANAEROBIC Blood Culture results may not be optimal due to an inadequate volume of blood received in culture bottles   Culture   Final    NO GROWTH 5 DAYS Performed at [facility], [address] [place], [place] [address]    Report Status 06/10/2024 FINAL  Final  Blood culture (routine x 2)     Status: None   Collection Time: 06/05/24  3:37 PM   Specimen: BLOOD  Result Value Ref Range Status   Specimen Description BLOOD BLOOD RIGHT FOREARM  Final   Special Requests   Final    BOTTLES DRAWN AEROBIC AND ANAEROBIC Blood Culture results may not be optimal due to an inadequate volume of blood received in culture bottles   Culture   Final    NO GROWTH 5 DAYS Performed at [facility], [address], [place], [place] [address]    Report Status 06/10/2024 FINAL  Final  MRSA Next Gen by PCR, Nasal     Status: None   Collection Time: 06/08/24 10:30 AM   Specimen: Nasal Mucosa; Nasal Swab  Result Value Ref Range Status   MRSA by PCR Next Gen NOT DETECTED NOT DETECTED Final    Comment: (NOTE) The GeneXpert MRSA Assay (FDA approved for NASAL specimens only), is one component of a comprehensive MRSA colonization surveillance program. It is not intended to diagnose MRSA infection nor to guide or monitor treatment for MRSA infections. Test performance is not FDA approved in patients less than 21 years old. Performed at [facility], [address] [place], [place] [address]   Respiratory (~20 pathogens) panel by PCR     Status: Abnormal   Collection Time: 06/08/24 10:30 AM   Specimen: Nasopharyngeal Swab; Respiratory  Result Value Ref Range Status   Adenovirus NOT DETECTED NOT DETECTED Final   Coronavirus 229E NOT DETECTED NOT DETECTED Final    Comment: (NOTE) The Coronavirus on the Respiratory Panel, DOES NOT test for the novel  Coronavirus (2019 nCoV)    Coronavirus HKU1 NOT DETECTED NOT DETECTED Final   Coronavirus NL63 NOT DETECTED NOT DETECTED Final   Coronavirus OC43 NOT DETECTED NOT DETECTED Final   Metapneumovirus NOT DETECTED NOT DETECTED Final   Rhinovirus / Enterovirus  DETECTED (A) NOT DETECTED Final   Influenza A NOT  DETECTED NOT DETECTED Final   Influenza B NOT DETECTED NOT DETECTED Final   Parainfluenza Virus 1 NOT DETECTED NOT DETECTED Final   Parainfluenza Virus 2 NOT DETECTED NOT DETECTED Final   Parainfluenza Virus 3 NOT DETECTED NOT DETECTED Final   Parainfluenza Virus 4 NOT DETECTED NOT DETECTED Final   Respiratory Syncytial Virus NOT DETECTED NOT DETECTED Final   Bordetella pertussis NOT DETECTED NOT DETECTED Final   Bordetella Parapertussis NOT DETECTED NOT DETECTED Final   Chlamydophila pneumoniae NOT DETECTED NOT DETECTED Final   Mycoplasma pneumoniae NOT DETECTED NOT DETECTED Final    Comment: Performed at [facility], [address] [place], [place] [address]     Labs: CBC: Recent Labs  Lab 06/05/24 1258 06/06/24 0517 06/07/24 1239 06/08/24 0635 06/12/24 0605  WBC 10.6* 8.5 12.7* 11.4* 8.7  NEUTROABS 9.4*  --   --   --   --   HGB 9.5* 9.0* 9.8* 8.0* 8.3*  HCT 28.1* 27.2* 29.1* 23.4* 25.9*  MCV 102.2* 103.4* 102.1* 101.7* 104.4*  PLT 356 319 465* 342 343   Basic Metabolic Panel: Recent Labs  Lab 06/05/24 1258 06/06/24 0517 06/06/24 0617 06/07/24 1239 06/08/24 0635 06/12/24 0605  NA 127* 132*  --  129* 133* 135  K 4.1 3.7  --  4.1 3.7 3.9  CL 93* 98  --  94* 93* 96*  CO2 23 27  --  25 30 31   GLUCOSE 109* 150*  --  119* 127* 95  BUN 14 13  --  10 12 17   CREATININE 0.44 0.50  --  0.71 0.47 0.58  CALCIUM 7.8* 7.8*  --  7.8* 7.5* 8.3*  MG 1.8  --  1.9 2.0 1.8 2.0  PHOS  --   --  2.3* 2.4* 3.4 2.8   Liver Function Tests: Recent Labs  Lab 06/05/24 1258  AST 23  ALT 17  ALKPHOS 110  BILITOT 0.9  PROT 6.4*  ALBUMIN 2.4*   No results for input(s): LIPASE, AMYLASE in the last 168 hours. No results for input(s): AMMONIA in the last 168 hours. Cardiac Enzymes: No results for input(s): CKTOTAL, CKMB, CKMBINDEX, TROPONINI in the last 168 hours. BNP (last 3 results) Recent Labs    06/05/24 1258  BNP 383.8*   CBG: Recent Labs  Lab  06/07/24 1214  GLUCAP 118*    Time spent: 35 minutes  Signed:  Elvan Sor  [facility] 06/12/2024 12:18 PM

## 2024-06-13 ENCOUNTER — Non-Acute Institutional Stay (SKILLED_NURSING_FACILITY): Payer: Self-pay | Admitting: Student

## 2024-06-13 ENCOUNTER — Encounter: Payer: Self-pay | Admitting: Student

## 2024-06-13 DIAGNOSIS — K219 Gastro-esophageal reflux disease without esophagitis: Secondary | ICD-10-CM

## 2024-06-13 DIAGNOSIS — R42 Dizziness and giddiness: Secondary | ICD-10-CM | POA: Diagnosis not present

## 2024-06-13 DIAGNOSIS — F419 Anxiety disorder, unspecified: Secondary | ICD-10-CM

## 2024-06-13 DIAGNOSIS — I4891 Unspecified atrial fibrillation: Secondary | ICD-10-CM

## 2024-06-13 DIAGNOSIS — J84112 Idiopathic pulmonary fibrosis: Secondary | ICD-10-CM

## 2024-06-13 DIAGNOSIS — M069 Rheumatoid arthritis, unspecified: Secondary | ICD-10-CM

## 2024-06-13 DIAGNOSIS — M810 Age-related osteoporosis without current pathological fracture: Secondary | ICD-10-CM

## 2024-06-13 DIAGNOSIS — J449 Chronic obstructive pulmonary disease, unspecified: Secondary | ICD-10-CM

## 2024-06-13 NOTE — Progress Notes (Unsigned)
 Provider:  Dr. Richerd Brigham Location:  Other [place].  Nursing Home Room Number: [facility] Place of Service:  SNF (31)  PCP: [name], NP Patient Care Team: [name], NP as PCP - General (Internal Medicine) Perla Evalene PARAS, MD as PCP - Cardiology (Cardiology) Perla Evalene PARAS, MD as Consulting Physician (Cardiology) Dolphus Reiter, MD as Consulting Physician (Rheumatology) Tamea Dedra CROME, MD as Consulting Physician (Pulmonary Disease)  Extended Emergency Contact Information Primary Emergency Contact: [facility] Address: [address]          [place], [place] [address] [place] Home Phone: [phone] Mobile Phone: [phone] Relation: Daughter  Code Status: DNR Goals of Care: Advanced Directive information    06/06/2024    6:35 PM  Advanced Directives  Would patient like information on creating a medical advance directive? No - Patient declined      Chief Complaint  Patient presents with   New Admit To SNF    Admission    HPI: Patient is a 89 y.o. female seen today for admission to [facility] History of Present Illness The patient is a 89 year old with atrial fibrillation and pneumonia who presents with dizziness and nausea. She is accompanied by her daughter, Crystal Gregory.  She has a history of atrial fibrillation with rapid ventricular rate, diagnosed during a recent hospitalization for pneumonia. Her treatment included antibiotics, amiodarone , and Eliquis . She is currently on a loading dose of amiodarone  200 mg twice a day and Eliquis  2.5 mg twice a day.  She has been experiencing progressive vertigo for approximately six months, which has worsened to the point of affecting her ability to perform daily activities such as making coffee. The vertigo is accompanied by nausea, leading to a significant weight loss of about 25 pounds. She has tried various medications including meclizine ,  scopolamine , and Phenergan , which were ineffective. Benadryl  seemed to provide some relief, although it was administered alongside other medications, making it difficult to determine its efficacy. She is currently using Reglan  for nausea, which she finds more effective than Zofran .  She has a history of chronic rheumatoid arthritis and COPD. Her arthritis is managed with azathioprine  50 mg twice a day and prednisone , alternating between 3 mg and 6 mg. She reports pain in her back due to a previous T11 fracture and pressure bruising on her bottom, which developed during her hospital stay. Oxycodone  was used for pain management in the hospital, but she is not currently taking it.  She has a history of chronic constipation, exacerbated by Zofran  use, which required enemas during her hospital stay. She is currently on a regimen of Miralax  twice a day and bisacodyl  at night to manage her bowel movements.  She reports a history of GERD, for which she takes Nexium  before breakfast and dinner. She also mentions a history of osteoporosis, previously managed with Prolia , but she is considering switching back to Reclast  due to concerns about Prolia .  She has a history of pulmonary issues, including chronic cough and suspected pneumonia, which was treated with multiple antibiotics during her recent hospitalization. She was previously on prophylactic azithromycin , which may be resumed after consultation with her pulmonologist.  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 She has two children, Crystal Gregory and Crystal Gregory, and  a great-grandchild. She has lived in her current residence for 12 years and has a large home with many books and a church organ. Her husband was in the [employer], and she moved 20 times. She has a  support system including her daughter, who checks on her frequently.  Family History - Mother: deceased at age 53 - Husband: Kidney disease, deceased at age 48 due to Congestive heart failure  Results RADIOLOGY Head MRI: Unremarkable  DIAGNOSTIC REPORTS Echocardiogram: Normal cardiac function   Past Medical History:  Diagnosis Date   Allergy    Anal fissure    Aspiration into airway    CAP (community acquired pneumonia) 08/23/2017   Chest pain    a. 09/04/2018 MV: EF >65%. No ischemia/infarct.   Coronary artery calcification seen on CT scan    Diastolic dysfunction    a. 10/2016 Echo: EF 60-65%, no rwma, Gr1 DD, mild MR, nl RV fxn, mild to mod TR, PASP .   Diverticulosis    Dyspnea    Edema of right lower extremity 01/29/2022   Fever 06/15/2023   GERD (gastroesophageal reflux disease)    Hemorrhoids    Hyperplastic colon polyp    Interstitial lung disorders (HCC)    Pulmonary fibrosis (HCC)    Septicemia (HCC) 1999   spent 4 weeks in ICU, intubated -? PNA   Vertebral fracture, pathological    Past Surgical History:  Procedure Laterality Date   ABDOMINAL HYSTERECTOMY     FLEXIBLE BRONCHOSCOPY N/A 07/05/2016   Procedure: FLEXIBLE BRONCHOSCOPY;  Surgeon: Jorie Cha, MD;  Location: [facility] ORS;  Service: Cardiopulmonary;  Laterality: N/A;   IR RADIOLOGIST EVAL & MGMT  11/05/2021    reports that she quit smoking about 43 years ago. Her smoking use included cigarettes. She started smoking about 63 years ago. She has a 5 pack-year smoking history. She has never used smokeless tobacco. She reports current alcohol use of about 7.0 standard drinks of alcohol per week. She reports that she does not use drugs. Social History   Socioeconomic History   Marital status: Widowed    Spouse name: Not on file   Number of children: 2   Years of education: Not on file   Highest education level: Not on file  Occupational History   Occupation: Retired   Tobacco Use   Smoking status:  Former    Current packs/day: 0.00    Average packs/day: 0.3 packs/day for 20.0 years (5.0 ttl pk-yrs)    Types: Cigarettes    Start date: 01/16/1961    Quit date: 01/16/1981    Years since quitting: 43.4   Smokeless tobacco: Never  Vaping Use   Vaping status: Never Used  Substance and Sexual Activity   Alcohol use: Yes    Alcohol/week: 7.0 standard drinks of alcohol    Types: 7 Standard drinks or equivalent per week   Drug use: No   Sexual activity: Not Currently  Other Topics Concern   Not on file  Social History Narrative   Married.     2 children.  Lives with her husband in [place].   Social Drivers of Corporate investment banker Strain: Low Risk  (04/28/2023)   Overall Financial Resource Strain (CARDIA)    Difficulty of Paying Living Expenses: Not hard at all  Food Insecurity: No Food Insecurity (06/06/2024)   Hunger Vital Sign    Worried About Running Out of Food in the Last Year: Never true    Ran Out of Food in  the Last Year: Never true  Transportation Needs: No Transportation Needs (06/06/2024)   PRAPARE - Administrator, Civil Service (Medical): No    Lack of Transportation (Non-Medical): No  Physical Activity: Sufficiently Active (04/28/2023)   Exercise Vital Sign    Days of Exercise per Week: 4 days    Minutes of Exercise per Session: 40 min  Stress: No Stress Concern Present (04/28/2023)   [employer] of Occupational Health - Occupational Stress Questionnaire    Feeling of Stress : Not at all  Social Connections: Moderately Isolated (06/06/2024)   Social Connection and Isolation Panel    Frequency of Communication with Friends and Family: Three times a week    Frequency of Social Gatherings with Friends and Family: Three times a week    Attends Religious Services: Never    Active Member of Clubs or Organizations: Yes    Attends Banker Meetings: More than 4 times per year    Marital Status: Widowed  Intimate Partner Violence: Not At  Risk (06/06/2024)   Humiliation, Afraid, Rape, and Kick questionnaire    Fear of Current or Ex-Partner: No    Emotionally Abused: No    Physically Abused: No    Sexually Abused: No    Functional Status Survey:    Family History  Problem Relation Age of Onset   Parkinson's disease Mother    Colon polyps Mother 45           Ulcers Father    Rheumatic fever Father    Macular degeneration Sister    Healthy Son    Healthy Daughter     Health Maintenance  Topic Date Due   Zoster Vaccines- Shingrix (1 of 2) 12/23/1950   Medicare Annual Wellness (AWV)  04/27/2024   Influenza Vaccine  05/04/2024   COVID-19 Vaccine (4 - 2025-26 season) 06/04/2024   Pneumococcal Vaccine: 50+ Years  Completed   DEXA SCAN  Completed   HPV VACCINES  Aged Out   Meningococcal B Vaccine  Aged Out   DTaP/Tdap/Td  Discontinued    No Known Allergies  Outpatient Encounter Medications as of 06/13/2024  Medication Sig   amiodarone  (PACERONE ) 200 MG tablet Take 1 tablet (200 mg total) by mouth 2 (two) times daily for 6 days.   apixaban  (ELIQUIS ) 2.5 MG TABS tablet Take 1 tablet (2.5 mg total) by mouth 2 (two) times daily.   azaTHIOprine  (IMURAN ) 50 MG tablet TAKE ONE AND ONE-HALF TABLETS (75 MG) EVERY MORNING AND 1 TABLET (50 MG) EVERY EVENING (Patient taking differently: Take 50 mg by mouth daily. TAKE ONE AND ONE-HALF TABLETS (75 MG) EVERY MORNING AND 1 TABLET (50 MG) EVERY EVENING)   bisacodyl  (DULCOLAX) 5 MG EC tablet Take 2 tablets (10 mg total) by mouth at bedtime.   Calcium Carb-Cholecalciferol (CALCIUM 600 + D PO) Take 1 tablet by mouth daily.   cyanocobalamin  (VITAMIN B12) 500 MCG tablet Take 1 tablet (500 mcg total) by mouth daily.   denosumab  (PROLIA ) 60 MG/ML SOLN injection Inject 60 mg into the skin every 6 (six) months. Administer in upper arm, thigh, or abdomen   diphenhydrAMINE  (BENADRYL ) 12.5 MG/5ML elixir Take 5 mLs (12.5 mg total) by mouth every 8 (eight) hours as needed (Vertigo).    esomeprazole  (NEXIUM ) 20 MG capsule Take 1 capsule (20 mg total) by mouth 2 (two) times daily before a meal.   gabapentin  (NEURONTIN ) 100 MG capsule Take 2 capsules (200 mg total) by mouth at bedtime.   LORazepam  (  ATIVAN ) 0.5 MG tablet Take 1 tablet (0.5 mg total) by mouth every 8 (eight) hours as needed (Vertigo).   metoCLOPramide  (REGLAN ) 5 MG tablet Take 1 tablet (5 mg total) by mouth every 8 (eight) hours as needed for nausea or vomiting (and Vertigo).   montelukast  (SINGULAIR ) 10 MG tablet Take 1 tablet (10 mg total) by mouth daily.   ondansetron  (ZOFRAN ) 4 MG tablet Take 1 tablet (4 mg total) by mouth daily as needed for nausea or vomiting.   oxyCODONE  (OXY IR/ROXICODONE ) 5 MG immediate release tablet Take 1 tablet (5 mg total) by mouth every 6 (six) hours as needed for moderate pain (pain score 4-6) or severe pain (pain score 7-10).   polyethylene glycol (MIRALAX  / GLYCOLAX ) 17 g packet Take 17 g by mouth 2 (two) times daily.   predniSONE  (DELTASONE ) 1 MG tablet Take 3 mg by mouth daily with breakfast.   [START ON 06/19/2024] amiodarone  (PACERONE ) 200 MG tablet Take 1 tablet (200 mg total) by mouth daily. (Patient not taking: Reported on 06/13/2024)   [DISCONTINUED] Multiple Minerals-Vitamins (CALCIUM & VIT D3 BONE HEALTH PO) Take 1 Dose by mouth daily.   Facility-Administered Encounter Medications as of 06/13/2024  Medication   denosumab  (PROLIA ) injection 60 mg   [START ON 08/04/2024] denosumab  (PROLIA ) injection 60 mg    Review of Systems  Vitals:   06/13/24 0920 06/13/24 0930  BP: (!) 142/64 111/64  Pulse: 82   Resp: (!) 24   Temp: (!) 97.3 F (36.3 C)   SpO2: 91%   Weight: 101 lb 9.6 oz (46.1 kg)   Height: 5' 8 (1.727 m)    Body mass index is 15.45 kg/m. Physical Exam Constitutional:      Comments: thin  Cardiovascular:     Rate and Rhythm: Normal rate and regular rhythm.     Pulses: Normal pulses.     Heart sounds: Normal heart sounds.  Pulmonary:     Effort:  Pulmonary effort is normal.     Breath sounds: Wheezing present.  Abdominal:     General: Abdomen is flat. Bowel sounds are normal.     Palpations: Abdomen is soft.  Musculoskeletal:        General: No swelling or tenderness.  Skin:    General: Skin is warm and dry.  Neurological:     Mental Status: She is alert.     Gait: Gait normal.  Psychiatric:        Mood and Affect: Mood normal.     Labs reviewed: Basic Metabolic Panel: Recent Labs    06/07/24 1239 06/08/24 0635 06/12/24 0605  NA 129* 133* 135  K 4.1 3.7 3.9  CL 94* 93* 96*  CO2 25 30 31   GLUCOSE 119* 127* 95  BUN 10 12 17   CREATININE 0.71 0.47 0.58  CALCIUM 7.8* 7.5* 8.3*  MG 2.0 1.8 2.0  PHOS 2.4* 3.4 2.8   Liver Function Tests: Recent Labs    06/05/24 1258  AST 23  ALT 17  ALKPHOS 110  BILITOT 0.9  PROT 6.4*  ALBUMIN 2.4*   No results for input(s): LIPASE, AMYLASE in the last 8760 hours. No results for input(s): AMMONIA in the last 8760 hours. CBC: Recent Labs    06/15/23 1134 07/08/23 1001 10/07/23 0956 06/05/24 1258 06/06/24 0517 06/07/24 1239 06/08/24 0635 06/12/24 0605  WBC 6.5 6.1   < > 10.6*   < > 12.7* 11.4* 8.7  NEUTROABS 4.7 3.3  --  9.4*  --   --   --   --  HGB 10.8* 10.7*   < > 9.5*   < > 9.8* 8.0* 8.3*  HCT 32.3* 32.5*   < > 28.1*   < > 29.1* 23.4* 25.9*  MCV 112.1* 113.2*   < > 102.2*   < > 102.1* 101.7* 104.4*  PLT 386.0 275   < > 356   < > 465* 342 343   < > = values in this interval not displayed.   Cardiac Enzymes: No results for input(s): CKTOTAL, CKMB, CKMBINDEX, TROPONINI in the last 8760 hours. BNP: Invalid input(s): POCBNP No results found for: HGBA1C Lab Results  Component Value Date   TSH 1.806 05/24/2024   Lab Results  Component Value Date   VITAMINB12 384 05/24/2024   Lab Results  Component Value Date   FOLATE 10.0 05/24/2024   Lab Results  Component Value Date   IRON 75 01/06/2024   TIBC 218 (L) 01/06/2024   FERRITIN 325 (H)  01/06/2024    Imaging and Procedures obtained prior to SNF admission: CT Angio Chest Pulmonary Embolism (PE) W or WO Contrast Result Date: 06/07/2024 CLINICAL DATA:  Pulmonary embolism suspected high probability. History of interstitial lung disease presenting with cough and dizziness EXAM: CT ANGIOGRAPHY CHEST WITH CONTRAST TECHNIQUE: Multidetector CT imaging of the chest was performed using the standard protocol during bolus administration of intravenous contrast. Multiplanar CT image reconstructions and MIPs were obtained to evaluate the vascular anatomy. RADIATION DOSE REDUCTION: This exam was performed according to the departmental dose-optimization program which includes automated exposure control, adjustment of the mA and/or kV according to patient size and/or use of iterative reconstruction technique. CONTRAST:  75mL OMNIPAQUE  IOHEXOL  350 MG/ML SOLN COMPARISON:  Same day chest radiograph and prior studies FINDINGS: Cardiovascular: Motion artifact and contrast bolus timing limits evaluation specifically of the bilateral lower lobe pulmonary artery branches. No definite pulmonary embolism identified elsewhere. Mediastinum/Nodes: Redemonstration of marked dilation of the proximal esophagus. Unchanged mildly enlarged mediastinal lymph nodes with a right paratracheal node measuring up to 1.0 cm in short axis, possibly reactive. Lungs/Pleura: Extensive bibasilar airspace disease with slightly increased consolidation in the bilateral lower lobes. Extensive bronchiectasis at the bilateral lower lobes with peripheral reticular opacities throughout the lungs, similar to prior. Small right and trace left pleural effusion. Upper Abdomen: No acute findings. Musculoskeletal: No acute osseous findings. Review of the MIP images confirms the above findings. IMPRESSION: 1. Limited examination with nondiagnostic evaluation of the bilateral lower lobe pulmonary artery branches due to motion artifact and contrast bolus  timing. However, no definite pulmonary embolism identified in the remaining pulmonary arteries bilaterally. 2. Extensive lower lobe predominant pulmonary opacities bilaterally, slightly increased compared to prior and possibly related infection. 3.  Small right and trace left pleural effusions. Aortic Atherosclerosis (ICD10-I70.0). Electronically Signed   By: Michaeline Blanch M.D.   On: 06/07/2024 15:52   US  EKG SITE RITE Result Date: 06/07/2024 If Site Rite image not attached, placement could not be confirmed due to current cardiac rhythm.  DG Chest Port 1 View Result Date: 06/07/2024 CLINICAL DATA:  Shortness of breath. EXAM: PORTABLE CHEST 1 VIEW COMPARISON:  June 05, 2024. FINDINGS: The heart size and mediastinal contours are within normal limits. Stable bibasilar infiltrates or edema is noted with stable right pleural effusion. The visualized skeletal structures are unremarkable. IMPRESSION: Stable bibasilar infiltrates or edema is noted with stable right pleural effusion. Electronically Signed   By: Lynwood Landy Raddle M.D.   On: 06/07/2024 12:58   ECHOCARDIOGRAM COMPLETE Result Date: 06/06/2024  ECHOCARDIOGRAM REPORT   Patient Name:   Crystal Gregory Date of Exam: 06/06/2024 Medical Rec #:  [id number]      Height:       68.0 in Accession #:    [id number]     Weight:       108.0 lb Date of Birth:  1932-06-20      BSA:          1.573 m Patient Age:    89 years       BP:           122/50 mmHg Patient Gender: F              HR:           80 bpm. Exam Location:  [facility] Procedure: 2D Echo, Cardiac Doppler, 3D Echo, Color Doppler and Strain Analysis            (Both Spectral and Color Flow Doppler were utilized during            procedure). Indications:     Atrial Fibrillation I48.91  History:         Patient has prior history of Echocardiogram examinations, most                  recent 01/21/2022. Signs/Symptoms:Chest Pain and Dyspnea.  Sonographer:     Christopher Furnace Referring Phys:  [id number] ELVAN SOR Diagnosing Phys:  Evalene Lunger MD  Sonographer Comments: Global longitudinal strain was attempted. IMPRESSIONS  1. Left ventricular ejection fraction, by estimation, is 60 to 65%. The left ventricle has normal function. The left ventricle has no regional wall motion abnormalities. Left ventricular diastolic parameters are consistent with Grade I diastolic dysfunction (impaired relaxation). The average left ventricular global longitudinal strain is -14.1 %.  2. Right ventricular systolic function is normal. The right ventricular size is normal.  3. The mitral valve is normal in structure. Mild to moderate mitral valve regurgitation. No evidence of mitral stenosis.  4. The aortic valve is normal in structure. Aortic valve regurgitation is not visualized. Aortic valve sclerosis is present, with no evidence of aortic valve stenosis.  5. The inferior vena cava is normal in size with greater than 50% respiratory variability, suggesting right atrial pressure of 3 mmHg. FINDINGS  Left Ventricle: Left ventricular ejection fraction, by estimation, is 60 to 65%. The left ventricle has normal function. The left ventricle has no regional wall motion abnormalities. The average left ventricular global longitudinal strain is -14.1 %. Strain was performed and the global longitudinal strain is indeterminate. The left ventricular internal cavity size was normal in size. There is no left ventricular hypertrophy. Left ventricular diastolic parameters are consistent with Grade I diastolic dysfunction (impaired relaxation). Right Ventricle: The right ventricular size is normal. No increase in right ventricular wall thickness. Right ventricular systolic function is normal. Left Atrium: Left atrial size was normal in size. Right Atrium: Right atrial size was normal in size. Pericardium: There is no evidence of pericardial effusion. Mitral Valve: The mitral valve is normal in structure. Mild mitral annular calcification. Mild to moderate mitral valve  regurgitation. No evidence of mitral valve stenosis. MV peak gradient, 6.0 mmHg. The mean mitral valve gradient is 2.0 mmHg. Tricuspid Valve: The tricuspid valve is normal in structure. Tricuspid valve regurgitation is mild . No evidence of tricuspid stenosis. Aortic Valve: The aortic valve is normal in structure. Aortic valve regurgitation is not visualized. Aortic valve sclerosis is present, with no evidence of aortic valve stenosis.  Aortic valve mean gradient measures 3.5 mmHg. Aortic valve peak gradient measures 6.0 mmHg. Aortic valve area, by VTI measures 3.30 cm. Pulmonic Valve: The pulmonic valve was normal in structure. Pulmonic valve regurgitation is not visualized. No evidence of pulmonic stenosis. Aorta: The aortic root is normal in size and structure. Venous: The inferior vena cava is normal in size with greater than 50% respiratory variability, suggesting right atrial pressure of 3 mmHg. IAS/Shunts: No atrial level shunt detected by color flow Doppler. Additional Comments: 3D was performed not requiring image post processing on an independent workstation and was indeterminate.  LEFT VENTRICLE PLAX 2D LVIDd:         2.60 cm   Diastology LVIDs:         1.90 cm   LV e' medial:    8.27 cm/s LV PW:         1.20 cm   LV E/e' medial:  10.1 LV IVS:        1.40 cm   LV e' lateral:   6.53 cm/s LVOT diam:     1.90 cm   LV E/e' lateral: 12.8 LV SV:         76 LV SV Index:   48        2D Longitudinal Strain LVOT Area:     2.84 cm  2D Strain GLS Avg:     -14.1 %  RIGHT VENTRICLE RV Basal diam:  3.70 cm RV Mid diam:    1.80 cm RV S prime:     13.60 cm/s TAPSE (M-mode): 2.1 cm LEFT ATRIUM           Index        RIGHT ATRIUM           Index LA diam:      3.00 cm 1.91 cm/m   RA Area:     13.50 cm LA Vol (A4C): 52.5 ml 33.38 ml/m  RA Volume:   32.00 ml  20.35 ml/m  AORTIC VALVE AV Area (Vmax):    2.89 cm AV Area (Vmean):   2.61 cm AV Area (VTI):     3.30 cm AV Vmax:           122.50 cm/s AV Vmean:          88.100  cm/s AV VTI:            0.230 m AV Peak Grad:      6.0 mmHg AV Mean Grad:      3.5 mmHg LVOT Vmax:         125.00 cm/s LVOT Vmean:        81.100 cm/s LVOT VTI:          0.268 m LVOT/AV VTI ratio: 1.16  AORTA Ao Root diam: 3.20 cm MITRAL VALVE                TRICUSPID VALVE MV Area (PHT): 3.60 cm     TR Peak grad:   33.6 mmHg MV Area VTI:   2.87 cm     TR Vmax:        290.00 cm/s MV Peak grad:  6.0 mmHg MV Mean grad:  2.0 mmHg     SHUNTS MV Vmax:       1.22 m/s     Systemic VTI:  0.27 m MV Vmean:      69.8 cm/s    Systemic Diam: 1.90 cm MV Decel Time: 211 msec MV E velocity: 83.80 cm/s MV A velocity:  121.00 cm/s MV E/A ratio:  0.69 Evalene Lunger MD Electronically signed by Evalene Lunger MD Signature Date/Time: 06/06/2024/2:10:32 PM    Final     Assessment/Plan Atrial fibrillation with rapid ventricular response, secondary to recent pneumonia Atrial fibrillation secondary to recent pneumonia was managed in the hospital. The heart is in good condition. Currently on amiodarone  and Eliquis , with a plan to wean off amiodarone . - Continue amiodarone  200 mg twice daily for a few days, then reduce to once daily - Continue Eliquis  2.5 mg twice daily - Follow up with cardiology  Chronic vertigo with persistent nausea and significant weight loss Six-month history of progressive vertigo with nausea and significant weight loss. Previous treatments including meclizine , scopolamine , and Zofran  were ineffective. Benadryl  provided some relief but was discontinued due to potential side effects. Reglan  is currently effective for nausea. - Schedule Reglan  every 8 hours for nausea - Discontinue Benadryl  - Monitor weight and consider protein supplementation if unable to finish meals  Chronic obstructive pulmonary disease with recent pneumonia, now resolved Recent pneumonia has resolved. COPD is managed with montelukast  and occasional DuoNeb treatments. Previous prophylactic azithromycin  was ineffective. - Continue  montelukast  as needed - Administer DuoNeb treatments as needed - Consult with pulmonologist regarding azithromycin  prophylaxis  Rheumatoid arthritis on immunosuppressive therapy Rheumatoid arthritis is managed with azathioprine  and prednisone . Dosage adjustments were made due to weight loss and infection risk. - Continue azathioprine  50 mg twice daily - Continue prednisone  3 mg one day and 6 mg the next day, alternating  Healing stage 2 pressure injury of sacral region Pressure injury developed during hospital stay. Improvement noted with sacral pad application. - Continue use of sacral pad - Encourage mobility and repositioning to prevent worsening  Medication-induced constipation Constipation attributed to previous use of Zofran  and current medications. Current regimen includes Miralax  and bisacodyl , which are effective. - Continue Miralax  twice daily - Continue bisacodyl  two tablets at night - Monitor bowel movements  Gastroesophageal reflux disease GERD managed with Nexium . Reglan  may also provide additional relief. - Continue Nexium  before breakfast and dinner - Monitor for additional relief with Reglan   Osteoporosis on antiresorptive therapy, transitioning from Prolia  to Reclast  Transitioning from Prolia  to Reclast  due to concerns about Prolia 's side effects. Reclast  is preferred for its bone rebuilding properties and less frequent dosing. - Refer to endocrinology for Reclast  administration - Consider bridging with a visit to [facility] for Reclast  infusion if needed  Pulmonary fibrosis Pulmonary fibrosis managed with montelukast , though its efficacy is uncertain. No inhalers are currently used. - Continue montelukast  as needed - Consider respiratory treatments if symptoms worsen  Episodic anxiety Anxiety episodes noted, possibly exacerbated by prednisone . Previous use of lorazepam  was not well-tolerated. - Monitor anxiety symptoms - Avoid lorazepam  due to adverse  effects  Family/ staff Communication: daughter, nursing  Labs/tests ordered: cbc bmp

## 2024-06-14 LAB — BASIC METABOLIC PANEL WITH GFR
BUN: 17 (ref 4–21)
CO2: 31 — AB (ref 13–22)
Chloride: 97 — AB (ref 99–108)
Creatinine: 0.5 (ref 0.5–1.1)
Glucose: 73
Potassium: 4.2 meq/L (ref 3.5–5.1)
Sodium: 137 (ref 137–147)

## 2024-06-14 LAB — CBC AND DIFFERENTIAL
HCT: 29 — AB (ref 36–46)
Hemoglobin: 9.3 — AB (ref 12.0–16.0)
Neutrophils Absolute: 4250
Platelets: 440 K/uL — AB (ref 150–400)
WBC: 6.4

## 2024-06-14 LAB — COMPREHENSIVE METABOLIC PANEL WITH GFR
Calcium: 8.4 — AB (ref 8.7–10.7)
eGFR: 88

## 2024-06-14 LAB — CBC: RBC: 2.75 — AB (ref 3.87–5.11)

## 2024-06-15 ENCOUNTER — Other Ambulatory Visit: Payer: Self-pay | Admitting: *Deleted

## 2024-06-15 ENCOUNTER — Other Ambulatory Visit: Payer: Self-pay | Admitting: Student

## 2024-06-15 DIAGNOSIS — M069 Rheumatoid arthritis, unspecified: Secondary | ICD-10-CM

## 2024-06-15 MED ORDER — OXYCODONE HCL 5 MG PO TABS
5.0000 mg | ORAL_TABLET | Freq: Four times a day (QID) | ORAL | 0 refills | Status: AC | PRN
Start: 2024-06-15 — End: ?

## 2024-06-15 NOTE — Telephone Encounter (Signed)
 Refill requested by [facility] per Dr. Abdul.

## 2024-06-15 NOTE — Progress Notes (Signed)
 Pain medication

## 2024-06-17 ENCOUNTER — Encounter: Payer: Self-pay | Admitting: Student

## 2024-06-18 ENCOUNTER — Non-Acute Institutional Stay (SKILLED_NURSING_FACILITY): Payer: Self-pay | Admitting: Adult Health

## 2024-06-18 ENCOUNTER — Encounter: Payer: Self-pay | Admitting: Adult Health

## 2024-06-18 DIAGNOSIS — D638 Anemia in other chronic diseases classified elsewhere: Secondary | ICD-10-CM | POA: Diagnosis not present

## 2024-06-18 DIAGNOSIS — R110 Nausea: Secondary | ICD-10-CM | POA: Diagnosis not present

## 2024-06-18 DIAGNOSIS — G629 Polyneuropathy, unspecified: Secondary | ICD-10-CM

## 2024-06-18 NOTE — Progress Notes (Signed)
 Location:  Other [facility]) Nursing Home Room Number: [place] [address] [facility]) Place of Service:  SNF (31) Provider:  Medina-Vargas, Parnell Spieler, DNP, FNP-BC  Patient Care Team: Crystal Comer POUR, NP as PCP - General (Internal Medicine) Perla Evalene PARAS, MD as PCP - Cardiology (Cardiology) Perla Evalene PARAS, MD as Consulting Physician (Cardiology) Dolphus Reiter, MD as Consulting Physician (Rheumatology) Tamea Dedra CROME, MD as Consulting Physician (Pulmonary Disease)  Extended Emergency Contact Information Primary Emergency Contact: [facility] Address: [address]          [name], [place] [address] [place] Home Phone: [phone] Mobile Phone: [phone] Relation: Daughter  Code Status:  DNR  Goals of care: Advanced Directive information    06/18/2024    9:35 AM  Advanced Directives  Does Patient Have a Medical Advance Directive? Yes  Type of Advance Directive Out of facility DNR (pink MOST or yellow form)  Does patient want to make changes to medical advance directive? No - Patient declined  Would patient like information on creating a medical advance directive? No - Patient declined     Chief Complaint  Patient presents with   Edema     BLE edema and nausea     HPI:  Pt is a 89 y.o. female seen today for an acute visit for nausea. She is a resident of [facility]. Daughter was at bedside visiting.  Patient has chronic of nausea. She takes Reglan  5 mg Q 8 hours and Zofran  4 mg Q 8 hours for nausea. She has tried Meclizine  and Scopolamine  but were ineffective for he nausea.  She takes Gabapentin  200 mg Q HS and Oxycodone  5 mg Q 6 hours PRN for neuropathy. She stated that the back of her neck pain is stable. She takes Trazodone  50 mg Q HS for insomnia.  Latest hgb 9.3. Per daughter, she follows up with oncology for her anemia. She has history of iron transfusion.   Past Medical History:  Diagnosis Date    Allergy    Anal fissure    Aspiration into airway    CAP (community acquired pneumonia) 08/23/2017   Chest pain    a. 09/04/2018 MV: EF >65%. No ischemia/infarct.   Coronary artery calcification seen on CT scan    Diastolic dysfunction    a. 10/2016 Echo: EF 60-65%, no rwma, Gr1 DD, mild MR, nl RV fxn, mild to mod TR, PASP .   Diverticulosis    Dyspnea    Edema of right lower extremity 01/29/2022   Fever 06/15/2023   GERD (gastroesophageal reflux disease)    Hemorrhoids    Hyperplastic colon polyp    Interstitial lung disorders (HCC)    Pulmonary fibrosis (HCC)    Septicemia (HCC) 1999   spent 4 weeks in ICU, intubated -? PNA   Vertebral fracture, pathological    Past Surgical History:  Procedure Laterality Date   ABDOMINAL HYSTERECTOMY     FLEXIBLE BRONCHOSCOPY N/A 07/05/2016   Procedure: FLEXIBLE BRONCHOSCOPY;  Surgeon: Jorie Cha, MD;  Location: [facility] ORS;  Service: Cardiopulmonary;  Laterality: N/A;   IR RADIOLOGIST EVAL & MGMT  11/05/2021    No Known Allergies  Outpatient Encounter Medications as of 06/18/2024  Medication Sig   acetaminophen  (TYLENOL ) 500 MG tablet Take 500 mg by mouth 3 (three) times daily.   amiodarone  (PACERONE ) 200 MG tablet Take 1 tablet (200 mg total) by mouth 2 (two) times daily for 6 days.   [START ON 06/19/2024] amiodarone  (  PACERONE ) 200 MG tablet Take 1 tablet (200 mg total) by mouth daily.   apixaban  (ELIQUIS ) 2.5 MG TABS tablet Take 1 tablet (2.5 mg total) by mouth 2 (two) times daily.   azaTHIOprine  (IMURAN ) 50 MG tablet TAKE ONE AND ONE-HALF TABLETS (75 MG) EVERY MORNING AND 1 TABLET (50 MG) EVERY EVENING (Patient taking differently: Take 50 mg by mouth daily. TAKE ONE AND ONE-HALF TABLETS (75 MG) EVERY MORNING AND 1 TABLET (50 MG) EVERY EVENING)   bisacodyl  (DULCOLAX) 5 MG EC tablet Take 2 tablets (10 mg total) by mouth at bedtime.   Calcium Carb-Cholecalciferol (CALCIUM 600 + D PO) Take 1 tablet by mouth daily.   cyanocobalamin  (VITAMIN  B12) 500 MCG tablet Take 1 tablet (500 mcg total) by mouth daily.   denosumab  (PROLIA ) 60 MG/ML SOLN injection Inject 60 mg into the skin every 6 (six) months. Administer in upper arm, thigh, or abdomen   esomeprazole  (NEXIUM ) 20 MG capsule Take 1 capsule (20 mg total) by mouth 2 (two) times daily before a meal.   gabapentin  (NEURONTIN ) 100 MG capsule Take 2 capsules (200 mg total) by mouth at bedtime.   metoCLOPramide  (REGLAN ) 5 MG tablet Take 1 tablet (5 mg total) by mouth every 8 (eight) hours as needed for nausea or vomiting (and Vertigo).   montelukast  (SINGULAIR ) 10 MG tablet Take 1 tablet (10 mg total) by mouth daily.   ondansetron  (ZOFRAN ) 4 MG tablet Take 1 tablet (4 mg total) by mouth daily as needed for nausea or vomiting.   oxyCODONE  (OXY IR/ROXICODONE ) 5 MG immediate release tablet Take 1 tablet (5 mg total) by mouth every 6 (six) hours as needed for moderate pain (pain score 4-6) or severe pain (pain score 7-10).   polyethylene glycol (MIRALAX  / GLYCOLAX ) 17 g packet Take 17 g by mouth 2 (two) times daily.   predniSONE  (DELTASONE ) 1 MG tablet Take 3 mg by mouth daily with breakfast.   [START ON 07/07/2024] sertraline (ZOLOFT) 100 MG tablet Take 100 mg by mouth daily.   sertraline (ZOLOFT) 25 MG tablet Take 25 mg by mouth daily.   [START ON 06/23/2024] sertraline (ZOLOFT) 50 MG tablet Take 50 mg by mouth daily.   [START ON 06/30/2024] SERTRALINE HCL PO Take 75 mg by mouth daily.   traZODone  (DESYREL ) 50 MG tablet Take 50 mg by mouth at bedtime.   diphenhydrAMINE  (BENADRYL ) 12.5 MG/5ML elixir Take 5 mLs (12.5 mg total) by mouth every 8 (eight) hours as needed (Vertigo). (Patient not taking: Reported on 06/18/2024)   LORazepam  (ATIVAN ) 0.5 MG tablet Take 1 tablet (0.5 mg total) by mouth every 8 (eight) hours as needed (Vertigo). (Patient not taking: Reported on 06/18/2024)   Facility-Administered Encounter Medications as of 06/18/2024  Medication   denosumab  (PROLIA ) injection 60 mg    [START ON 08/04/2024] denosumab  (PROLIA ) injection 60 mg    Review of Systems  Constitutional:  Negative for appetite change, chills, fatigue and fever.  HENT:  Positive for hearing loss. Negative for congestion, rhinorrhea and sore throat.   Eyes: Negative.   Respiratory:  Negative for cough, shortness of breath and wheezing.   Cardiovascular:  Negative for chest pain, palpitations and leg swelling.  Gastrointestinal:  Positive for nausea. Negative for abdominal pain, constipation, diarrhea and vomiting.  Genitourinary:  Negative for dysuria.  Musculoskeletal:  Negative for arthralgias, back pain and myalgias.  Skin:  Negative for color change, rash and wound.  Neurological:  Negative for dizziness, weakness and headaches.  Psychiatric/Behavioral:  Negative  for behavioral problems. The patient is not nervous/anxious.      Immunization History  Administered Date(s) Administered   Fluad Quad(high Dose 65+) 07/24/2019, 07/04/2022, 07/28/2023   INFLUENZA, HIGH DOSE SEASONAL PF 09/10/2016   Influenza Split 06/04/2012   Influenza,inj,Quad PF,6+ Mos 07/04/2013, 08/18/2015, 07/13/2017, 06/19/2018   Influenza-Unspecified 07/15/2020   Moderna SARS-COV2 Booster Vaccination 05/19/2020, 02/19/2021   Moderna Sars-Covid-2 Vaccination 10/22/2019, 11/19/2019, 04/02/2022   Pneumococcal Conjugate-13 10/06/2011   Pneumococcal Polysaccharide-23 06/19/2018   Zoster, Live 01/14/2007   Pertinent  Health Maintenance Due  Topic Date Due   Influenza Vaccine  05/04/2024   DEXA SCAN  Completed      04/26/2022    8:24 AM 12/22/2022    9:42 AM 04/28/2023    9:28 AM 12/20/2023   10:47 AM 01/25/2024    3:27 PM  Fall Risk  Falls in the past year? 0 0 0 0 0  Was there an injury with Fall? 0 0 0 0 0  Fall Risk Category Calculator 0 0 0 0 0  Fall Risk Category (Retired) Low       Patient at Risk for Falls Due to No Fall Risks No Fall Risks No Fall Risks No Fall Risks No Fall Risks  Fall risk Follow up Falls  evaluation completed  Falls evaluation completed Falls prevention discussed;Falls evaluation completed Falls evaluation completed Falls evaluation completed     Data saved with a previous flowsheet row definition     Vitals:   06/18/24 0946  BP: (!) 123/58  Pulse: 72  Resp: 15  Temp: 97.6 F (36.4 C)  SpO2: 93%  Weight: 101 lb 9.6 oz (46.1 kg)  Height: 5' 8 (1.727 m)   Body mass index is 15.45 kg/m.  Physical Exam Constitutional:      General: She is not in acute distress. HENT:     Head: Normocephalic and atraumatic.     Nose: Nose normal.     Mouth/Throat:     Mouth: Mucous membranes are moist.  Eyes:     Conjunctiva/sclera: Conjunctivae normal.  Cardiovascular:     Rate and Rhythm: Normal rate and regular rhythm.  Pulmonary:     Effort: Pulmonary effort is normal.     Breath sounds: Normal breath sounds.  Abdominal:     General: Bowel sounds are normal.     Palpations: Abdomen is soft.  Musculoskeletal:        General: Normal range of motion.     Cervical back: Normal range of motion.  Skin:    General: Skin is warm and dry.     Findings: Bruising present.     Comments: Bruising on BUE  Neurological:     General: No focal deficit present.     Mental Status: She is alert and oriented to person, place, and time.  Psychiatric:        Mood and Affect: Mood normal.        Behavior: Behavior normal.      Labs reviewed: Recent Labs    06/07/24 1239 06/08/24 0635 06/12/24 0605  NA 129* 133* 135  K 4.1 3.7 3.9  CL 94* 93* 96*  CO2 25 30 31   GLUCOSE 119* 127* 95  BUN 10 12 17   CREATININE 0.71 0.47 0.58  CALCIUM 7.8* 7.5* 8.3*  MG 2.0 1.8 2.0  PHOS 2.4* 3.4 2.8   Recent Labs    06/05/24 1258  AST 23  ALT 17  ALKPHOS 110  BILITOT 0.9  PROT 6.4*  ALBUMIN 2.4*   Recent Labs    07/08/23 1001 10/07/23 0956 06/05/24 1258 06/06/24 0517 06/07/24 1239 06/08/24 0635 06/12/24 0605  WBC 6.1   < > 10.6*   < > 12.7* 11.4* 8.7  NEUTROABS 3.3  --   9.4*  --   --   --   --   HGB 10.7*   < > 9.5*   < > 9.8* 8.0* 8.3*  HCT 32.5*   < > 28.1*   < > 29.1* 23.4* 25.9*  MCV 113.2*   < > 102.2*   < > 102.1* 101.7* 104.4*  PLT 275   < > 356   < > 465* 342 343   < > = values in this interval not displayed.   Lab Results  Component Value Date   TSH 1.806 05/24/2024   No results found for: HGBA1C No results found for: CHOL, HDL, LDLCALC, LDLDIRECT, TRIG, CHOLHDL  Significant Diagnostic Results in last 30 days:  DG Abd 1 View Result Date: 06/10/2024 CLINICAL DATA:  Possible fecal impaction or small bowel obstruction. EXAM: ABDOMEN - 1 VIEW COMPARISON:  None Available. FINDINGS: No abnormal bowel dilatation. Mild amount of stool seen throughout the colon. Phleboliths are noted in the pelvis. IMPRESSION: No abnormal bowel dilatation. Mild stool burden. Electronically Signed   By: Lynwood Landy Raddle M.D.   On: 06/10/2024 13:07   CT Angio Chest Pulmonary Embolism (PE) W or WO Contrast Result Date: 06/07/2024 CLINICAL DATA:  Pulmonary embolism suspected high probability. History of interstitial lung disease presenting with cough and dizziness EXAM: CT ANGIOGRAPHY CHEST WITH CONTRAST TECHNIQUE: Multidetector CT imaging of the chest was performed using the standard protocol during bolus administration of intravenous contrast. Multiplanar CT image reconstructions and MIPs were obtained to evaluate the vascular anatomy. RADIATION DOSE REDUCTION: This exam was performed according to the departmental dose-optimization program which includes automated exposure control, adjustment of the mA and/or kV according to patient size and/or use of iterative reconstruction technique. CONTRAST:  75mL OMNIPAQUE  IOHEXOL  350 MG/ML SOLN COMPARISON:  Same day chest radiograph and prior studies FINDINGS: Cardiovascular: Motion artifact and contrast bolus timing limits evaluation specifically of the bilateral lower lobe pulmonary artery branches. No definite pulmonary  embolism identified elsewhere. Mediastinum/Nodes: Redemonstration of marked dilation of the proximal esophagus. Unchanged mildly enlarged mediastinal lymph nodes with a right paratracheal node measuring up to 1.0 cm in short axis, possibly reactive. Lungs/Pleura: Extensive bibasilar airspace disease with slightly increased consolidation in the bilateral lower lobes. Extensive bronchiectasis at the bilateral lower lobes with peripheral reticular opacities throughout the lungs, similar to prior. Small right and trace left pleural effusion. Upper Abdomen: No acute findings. Musculoskeletal: No acute osseous findings. Review of the MIP images confirms the above findings. IMPRESSION: 1. Limited examination with nondiagnostic evaluation of the bilateral lower lobe pulmonary artery branches due to motion artifact and contrast bolus timing. However, no definite pulmonary embolism identified in the remaining pulmonary arteries bilaterally. 2. Extensive lower lobe predominant pulmonary opacities bilaterally, slightly increased compared to prior and possibly related infection. 3.  Small right and trace left pleural effusions. Aortic Atherosclerosis (ICD10-I70.0). Electronically Signed   By: Michaeline Blanch M.D.   On: 06/07/2024 15:52   US  EKG SITE RITE Result Date: 06/07/2024 If Site Rite image not attached, placement could not be confirmed due to current cardiac rhythm.  DG Chest Port 1 View Result Date: 06/07/2024 CLINICAL DATA:  Shortness of breath. EXAM: PORTABLE CHEST 1 VIEW COMPARISON:  June 05, 2024. FINDINGS:  The heart size and mediastinal contours are within normal limits. Stable bibasilar infiltrates or edema is noted with stable right pleural effusion. The visualized skeletal structures are unremarkable. IMPRESSION: Stable bibasilar infiltrates or edema is noted with stable right pleural effusion. Electronically Signed   By: Lynwood Landy Raddle M.D.   On: 06/07/2024 12:58   ECHOCARDIOGRAM COMPLETE Result Date:  06/06/2024    ECHOCARDIOGRAM REPORT   Patient Name:   Crystal Gregory Date of Exam: 06/06/2024 Medical Rec #:  [id number]      Height:       68.0 in Accession #:    [id number]     Weight:       108.0 lb Date of Birth:  12-Apr-1932      BSA:          1.573 m Patient Age:    89 years       BP:           122/50 mmHg Patient Gender: F              HR:           80 bpm. Exam Location:  [facility] Procedure: 2D Echo, Cardiac Doppler, 3D Echo, Color Doppler and Strain Analysis            (Both Spectral and Color Flow Doppler were utilized during            procedure). Indications:     Atrial Fibrillation I48.91  History:         Patient has prior history of Echocardiogram examinations, most                  recent 01/21/2022. Signs/Symptoms:Chest Pain and Dyspnea.  Sonographer:     Christopher Furnace Referring Phys:  [id number] ELVAN SOR Diagnosing Phys: Evalene Lunger MD  Sonographer Comments: Global longitudinal strain was attempted. IMPRESSIONS  1. Left ventricular ejection fraction, by estimation, is 60 to 65%. The left ventricle has normal function. The left ventricle has no regional wall motion abnormalities. Left ventricular diastolic parameters are consistent with Grade I diastolic dysfunction (impaired relaxation). The average left ventricular global longitudinal strain is -14.1 %.  2. Right ventricular systolic function is normal. The right ventricular size is normal.  3. The mitral valve is normal in structure. Mild to moderate mitral valve regurgitation. No evidence of mitral stenosis.  4. The aortic valve is normal in structure. Aortic valve regurgitation is not visualized. Aortic valve sclerosis is present, with no evidence of aortic valve stenosis.  5. The inferior vena cava is normal in size with greater than 50% respiratory variability, suggesting right atrial pressure of 3 mmHg. FINDINGS  Left Ventricle: Left ventricular ejection fraction, by estimation, is 60 to 65%. The left ventricle has normal function. The left ventricle  has no regional wall motion abnormalities. The average left ventricular global longitudinal strain is -14.1 %. Strain was performed and the global longitudinal strain is indeterminate. The left ventricular internal cavity size was normal in size. There is no left ventricular hypertrophy. Left ventricular diastolic parameters are consistent with Grade I diastolic dysfunction (impaired relaxation). Right Ventricle: The right ventricular size is normal. No increase in right ventricular wall thickness. Right ventricular systolic function is normal. Left Atrium: Left atrial size was normal in size. Right Atrium: Right atrial size was normal in size. Pericardium: There is no evidence of pericardial effusion. Mitral Valve: The mitral valve is normal in structure. Mild mitral annular calcification. Mild to moderate mitral valve regurgitation.  No evidence of mitral valve stenosis. MV peak gradient, 6.0 mmHg. The mean mitral valve gradient is 2.0 mmHg. Tricuspid Valve: The tricuspid valve is normal in structure. Tricuspid valve regurgitation is mild . No evidence of tricuspid stenosis. Aortic Valve: The aortic valve is normal in structure. Aortic valve regurgitation is not visualized. Aortic valve sclerosis is present, with no evidence of aortic valve stenosis. Aortic valve mean gradient measures 3.5 mmHg. Aortic valve peak gradient measures 6.0 mmHg. Aortic valve area, by VTI measures 3.30 cm. Pulmonic Valve: The pulmonic valve was normal in structure. Pulmonic valve regurgitation is not visualized. No evidence of pulmonic stenosis. Aorta: The aortic root is normal in size and structure. Venous: The inferior vena cava is normal in size with greater than 50% respiratory variability, suggesting right atrial pressure of 3 mmHg. IAS/Shunts: No atrial level shunt detected by color flow Doppler. Additional Comments: 3D was performed not requiring image post processing on an independent workstation and was indeterminate.  LEFT  VENTRICLE PLAX 2D LVIDd:         2.60 cm   Diastology LVIDs:         1.90 cm   LV e' medial:    8.27 cm/s LV PW:         1.20 cm   LV E/e' medial:  10.1 LV IVS:        1.40 cm   LV e' lateral:   6.53 cm/s LVOT diam:     1.90 cm   LV E/e' lateral: 12.8 LV SV:         76 LV SV Index:   48        2D Longitudinal Strain LVOT Area:     2.84 cm  2D Strain GLS Avg:     -14.1 %  RIGHT VENTRICLE RV Basal diam:  3.70 cm RV Mid diam:    1.80 cm RV S prime:     13.60 cm/s TAPSE (M-mode): 2.1 cm LEFT ATRIUM           Index        RIGHT ATRIUM           Index LA diam:      3.00 cm 1.91 cm/m   RA Area:     13.50 cm LA Vol (A4C): 52.5 ml 33.38 ml/m  RA Volume:   32.00 ml  20.35 ml/m  AORTIC VALVE AV Area (Vmax):    2.89 cm AV Area (Vmean):   2.61 cm AV Area (VTI):     3.30 cm AV Vmax:           122.50 cm/s AV Vmean:          88.100 cm/s AV VTI:            0.230 m AV Peak Grad:      6.0 mmHg AV Mean Grad:      3.5 mmHg LVOT Vmax:         125.00 cm/s LVOT Vmean:        81.100 cm/s LVOT VTI:          0.268 m LVOT/AV VTI ratio: 1.16  AORTA Ao Root diam: 3.20 cm MITRAL VALVE                TRICUSPID VALVE MV Area (PHT): 3.60 cm     TR Peak grad:   33.6 mmHg MV Area VTI:   2.87 cm     TR Vmax:        290.00  cm/s MV Peak grad:  6.0 mmHg MV Mean grad:  2.0 mmHg     SHUNTS MV Vmax:       1.22 m/s     Systemic VTI:  0.27 m MV Vmean:      69.8 cm/s    Systemic Diam: 1.90 cm MV Decel Time: 211 msec MV E velocity: 83.80 cm/s MV A velocity: 121.00 cm/s MV E/A ratio:  0.69 Evalene Lunger MD Electronically signed by Evalene Lunger MD Signature Date/Time: 06/06/2024/2:10:32 PM    Final    US  Venous Img Lower Bilateral Result Date: 06/05/2024 CLINICAL DATA:  elevated d-dimer EXAM: BILATERAL LOWER EXTREMITY VENOUS DOPPLER ULTRASOUND TECHNIQUE: Gray-scale sonography with graded compression, as well as color Doppler and duplex ultrasound were performed to evaluate the lower extremity deep venous systems from the level of the common femoral  vein and including the common femoral, femoral, profunda femoral, popliteal and calf veins including the posterior tibial, peroneal and gastrocnemius veins when visible. The superficial great saphenous vein was also interrogated. Spectral Doppler was utilized to evaluate flow at rest and with distal augmentation maneuvers in the common femoral, femoral and popliteal veins. COMPARISON:  January 12, 2022 FINDINGS: RIGHT LOWER EXTREMITY Common Femoral Vein: No evidence of thrombus. Normal compressibility, respiratory phasicity and response to augmentation. Saphenofemoral Junction: No evidence of thrombus. Normal compressibility and flow on color Doppler imaging. Profunda Femoral Vein: No evidence of thrombus. Normal compressibility and flow on color Doppler imaging. Femoral Vein: No evidence of thrombus. Normal compressibility, respiratory phasicity and response to augmentation. Popliteal Vein: No evidence of thrombus. Normal compressibility, respiratory phasicity and response to augmentation. Calf Veins: No evidence of thrombus. Normal compressibility and flow on color Doppler imaging. Superficial Great Saphenous Vein: No evidence of thrombus. Normal compressibility. Other Findings:  The small saphenous vein is thrombosed. LEFT LOWER EXTREMITY Common Femoral Vein: No evidence of thrombus. Normal compressibility, respiratory phasicity and response to augmentation. Saphenofemoral Junction: No evidence of thrombus. Normal compressibility and flow on color Doppler imaging. Profunda Femoral Vein: No evidence of thrombus. Normal compressibility and flow on color Doppler imaging. Femoral Vein: No evidence of thrombus. Normal compressibility, respiratory phasicity and response to augmentation. Popliteal Vein: No evidence of thrombus. Normal compressibility, respiratory phasicity and response to augmentation. Calf Veins: No evidence of thrombus. Normal compressibility and flow on color Doppler imaging. Superficial Great  Saphenous Vein: No evidence of thrombus. Normal compressibility. Other Findings: Likely popliteal cysts bilaterally, measuring up to 3.5 cm on the right. IMPRESSION: 1. Negative for deep venous thrombosis within both legs. 2. Positive for superficial venous thrombosis in the small saphenous vein on the right. Electronically Signed   By: Rogelia Myers M.D.   On: 06/05/2024 18:32   CT Angio Chest PE W and/or Wo Contrast Result Date: 06/05/2024 CLINICAL DATA:  Elevated D-dimer.  Concern for pulmonary embolism EXAM: CT ANGIOGRAPHY CHEST WITH CONTRAST TECHNIQUE: Multidetector CT imaging of the chest was performed using the standard protocol during bolus administration of intravenous contrast. Multiplanar CT image reconstructions and MIPs were obtained to evaluate the vascular anatomy. RADIATION DOSE REDUCTION: This exam was performed according to the departmental dose-optimization program which includes automated exposure control, adjustment of the mA and/or kV according to patient size and/or use of iterative reconstruction technique. CONTRAST:  75mL OMNIPAQUE  IOHEXOL  350 MG/ML SOLN COMPARISON:  Chest CT 05/05/2021 FINDINGS: Cardiovascular: No filling defects within the pulmonary arteries to suggest acute pulmonary embolism. Mediastinum/Nodes: Marked dilatation the proximal esophagus. The esophagus above the carina measures 5.4 cm  in diameter. There is gas filled esophagus and GE junction. The more distal esophagus is normal caliber at 1.2 cm. No fluid esophagus. No food stuff esophagus. Esophageal dilatation is similar tube of progressed from CT 2022. No mediastinal adenopathy.  Trachea normal Lungs/Pleura: There is dense bibasilar airspace disease airspace disease extends into the lingula and RIGHT middle lobe. Six dense of bronchiectasis in the lower lobes. Upper Abdomen: Limited view of the liver, kidneys, pancreas are unremarkable. Normal adrenal glands. Musculoskeletal: Very little body fat. No aggressive  osseous lesion compression fracture of the lower thoracic spine appears chronic. No acute fracture Review of the MIP images confirms the above findings. IMPRESSION: 1. No evidence acute pulmonary embolism. 2. Dense bibasilar airspace disease with bronchiectasis. Findings concerning severe bibasilar pneumonia. 3. Marked dilatation of the proximal esophagus. No fluid in the esophagus. No evidence of esophageal obstruction. 4. Very little body fat. Electronically Signed   By: Jackquline Boxer M.D.   On: 06/05/2024 17:01   DG Chest 1 View Result Date: 06/05/2024 CLINICAL DATA:  Dizziness, productive cough, COPD; evaluate for pleural edema EXAM: CHEST  1 VIEW COMPARISON:  November 04, 2023 May 05, 2021 FINDINGS: Biapical pleuroparenchymal scarring. Chronic changes of interstitial lung disease again noted. Increasing patchy airspace opacities in the right mid and left lower lung zones. No pneumothorax. Chronic blunting of the right costophrenic sulcus, unchanged. No cardiomegaly. Aortic atherosclerosis. No acute fracture or destructive lesions. Multilevel thoracic osteophytosis. Osteopenia. IMPRESSION: Underlying changes of chronic interstitial lung disease again noted. Increasing patchy airspace opacities in the right mid and left lower lung zones, worrisome for superimposed bronchopneumonia. Electronically Signed   By: Rogelia Myers M.D.   On: 06/05/2024 14:24    Assessment/Plan  1. Nausea (Primary) -  will increase reglan  5 mg to Q 6 hours X 1 week then Q 8 hours -  continue Zofran  4 mg Q 8 hours PRN  2. Neuropathy -  will decrease Gabapentin  from 200 mg to 100 mg at bedtime  3. Anemia of chronic disease -  hgb 9.3 -  follows up with oncology -  will re-check CBC    Family/ staff Communication: Discussed plan of care with resident, daughter and charge nurse.  Labs/tests ordered:  CBC    Jereld Serum, DNP, MSN, FNP-BC [facility] [phone]  (Monday-Friday 8:00 a.m. - 5:00 p.m.) [phone] (after hours)

## 2024-06-21 LAB — CBC AND DIFFERENTIAL
HCT: 31 — AB (ref 36–46)
Hemoglobin: 9.9 — AB (ref 12.0–16.0)
Neutrophils Absolute: 6050
Platelets: 343 K/uL (ref 150–400)
WBC: 7.6

## 2024-06-21 LAB — CBC: RBC: 2.82 — AB (ref 3.87–5.11)

## 2024-06-23 NOTE — Progress Notes (Incomplete)
 Location:  Other [facility]) Nursing Home Room Number: [place] [facility] [facility]) Place of Service:  SNF (31) Provider:  Medina-Vargas, Teralyn Mullins, DNP, FNP-BC  Patient Care Team: [name], NP as PCP - General (Internal Medicine) Perla Evalene PARAS, MD as PCP - Cardiology (Cardiology) Perla Evalene PARAS, MD as Consulting Physician (Cardiology) Dolphus Reiter, MD as Consulting Physician (Rheumatology) Tamea Dedra CROME, MD as Consulting Physician (Pulmonary Disease)  Extended Emergency Contact Information Primary Emergency Contact: [facility] Address: [address]          [place], [place] [address] [place] Home Phone: [phone] Mobile Phone: [phone] Relation: Daughter  Code Status:  DNR  Goals of care: Advanced Directive information    06/18/2024    9:35 AM  Advanced Directives  Does Patient Have a Medical Advance Directive? Yes  Type of Advance Directive Out of facility DNR (pink MOST or yellow form)  Does patient want to make changes to medical advance directive? No - Patient declined  Would patient like information on creating a medical advance directive? No - Patient declined     Chief Complaint  Patient presents with  . Edema     BLE edema and nausea     HPI:  Pt is a 89 y.o. female seen today for an acute visit for nausea. She is a resident of [facility]. Daughter was at bedside visiting.  Patient has chronic of nausea. She takes Reglan  5 mg Q 8 hours and Zofran  4 mg Q 8 hours for nausea. She has tried Meclizine  and Scopolamine  but were ineffective for he nausea.  She    Past Medical History:  Diagnosis Date  . Allergy   . Anal fissure   . Aspiration into airway   . CAP (community acquired pneumonia) 08/23/2017  . Chest pain    a. 09/04/2018 MV: EF >65%. No ischemia/infarct.  . Coronary artery calcification seen on CT scan   . Diastolic dysfunction    a. 10/2016 Echo: EF 60-65%, no rwma, Gr1  DD, mild MR, nl RV fxn, mild to mod TR, PASP .  . Diverticulosis   . Dyspnea   . Edema of right lower extremity 01/29/2022  . Fever 06/15/2023  . GERD (gastroesophageal reflux disease)   . Hemorrhoids   . Hyperplastic colon polyp   . Interstitial lung disorders (HCC)   . Pulmonary fibrosis (HCC)   . Septicemia (HCC) 1999   spent 4 weeks in ICU, intubated -? PNA  . Vertebral fracture, pathological    Past Surgical History:  Procedure Laterality Date  . ABDOMINAL HYSTERECTOMY    . FLEXIBLE BRONCHOSCOPY N/A 07/05/2016   Procedure: FLEXIBLE BRONCHOSCOPY;  Surgeon: Jorie Cha, MD;  Location: [facility] ORS;  Service: Cardiopulmonary;  Laterality: N/A;  . IR RADIOLOGIST EVAL & MGMT  11/05/2021    No Known Allergies  Outpatient Encounter Medications as of 06/18/2024  Medication Sig  . acetaminophen  (TYLENOL ) 500 MG tablet Take 500 mg by mouth 3 (three) times daily.  . amiodarone  (PACERONE ) 200 MG tablet Take 1 tablet (200 mg total) by mouth 2 (two) times daily for 6 days.  SABRA CASTLEMAN ON 06/19/2024] amiodarone  (PACERONE ) 200 MG tablet Take 1 tablet (200 mg total) by mouth daily.  . apixaban  (ELIQUIS ) 2.5 MG TABS tablet Take 1 tablet (2.5 mg total) by mouth 2 (two) times daily.  . azaTHIOprine  (IMURAN ) 50 MG tablet TAKE ONE AND ONE-HALF TABLETS (75 MG) EVERY MORNING AND 1 TABLET (50 MG) EVERY  EVENING (Patient taking differently: Take 50 mg by mouth daily. TAKE ONE AND ONE-HALF TABLETS (75 MG) EVERY MORNING AND 1 TABLET (50 MG) EVERY EVENING)  . bisacodyl  (DULCOLAX) 5 MG EC tablet Take 2 tablets (10 mg total) by mouth at bedtime.  . Calcium Carb-Cholecalciferol (CALCIUM 600 + D PO) Take 1 tablet by mouth daily.  . cyanocobalamin  (VITAMIN B12) 500 MCG tablet Take 1 tablet (500 mcg total) by mouth daily.  . denosumab  (PROLIA ) 60 MG/ML SOLN injection Inject 60 mg into the skin every 6 (six) months. Administer in upper arm, thigh, or abdomen  . esomeprazole  (NEXIUM ) 20 MG capsule Take 1 capsule (20  mg total) by mouth 2 (two) times daily before a meal.  . gabapentin  (NEURONTIN ) 100 MG capsule Take 2 capsules (200 mg total) by mouth at bedtime.  . metoCLOPramide  (REGLAN ) 5 MG tablet Take 1 tablet (5 mg total) by mouth every 8 (eight) hours as needed for nausea or vomiting (and Vertigo).  . montelukast  (SINGULAIR ) 10 MG tablet Take 1 tablet (10 mg total) by mouth daily.  . ondansetron  (ZOFRAN ) 4 MG tablet Take 1 tablet (4 mg total) by mouth daily as needed for nausea or vomiting.  . oxyCODONE  (OXY IR/ROXICODONE ) 5 MG immediate release tablet Take 1 tablet (5 mg total) by mouth every 6 (six) hours as needed for moderate pain (pain score 4-6) or severe pain (pain score 7-10).  . polyethylene glycol (MIRALAX  / GLYCOLAX ) 17 g packet Take 17 g by mouth 2 (two) times daily.  . predniSONE  (DELTASONE ) 1 MG tablet Take 3 mg by mouth daily with breakfast.  . [START ON 07/07/2024] sertraline (ZOLOFT) 100 MG tablet Take 100 mg by mouth daily.  . sertraline (ZOLOFT) 25 MG tablet Take 25 mg by mouth daily.  SABRA CASTLEMAN ON 06/23/2024] sertraline (ZOLOFT) 50 MG tablet Take 50 mg by mouth daily.  SABRA CASTLEMAN ON 06/30/2024] SERTRALINE HCL PO Take 75 mg by mouth daily.  . traZODone  (DESYREL ) 50 MG tablet Take 50 mg by mouth at bedtime.  . diphenhydrAMINE  (BENADRYL ) 12.5 MG/5ML elixir Take 5 mLs (12.5 mg total) by mouth every 8 (eight) hours as needed (Vertigo). (Patient not taking: Reported on 06/18/2024)  . LORazepam  (ATIVAN ) 0.5 MG tablet Take 1 tablet (0.5 mg total) by mouth every 8 (eight) hours as needed (Vertigo). (Patient not taking: Reported on 06/18/2024)   Facility-Administered Encounter Medications as of 06/18/2024  Medication  . denosumab  (PROLIA ) injection 60 mg  . [START ON 08/04/2024] denosumab  (PROLIA ) injection 60 mg    Review of Systems  Constitutional:  Negative for appetite change, chills, fatigue and fever.  HENT:  Positive for hearing loss. Negative for congestion, rhinorrhea and sore throat.    Eyes: Negative.   Respiratory:  Negative for cough, shortness of breath and wheezing.   Cardiovascular:  Negative for chest pain, palpitations and leg swelling.  Gastrointestinal:  Positive for nausea. Negative for abdominal pain, constipation, diarrhea and vomiting.  Genitourinary:  Negative for dysuria.  Musculoskeletal:  Negative for arthralgias, back pain and myalgias.  Skin:  Negative for color change, rash and wound.  Neurological:  Negative for dizziness, weakness and headaches.  Psychiatric/Behavioral:  Negative for behavioral problems. The patient is not nervous/anxious.    ***    Immunization History  Administered Date(s) Administered  . Fluad Quad(high Dose 65+) 07/24/2019, 07/04/2022, 07/28/2023  . INFLUENZA, HIGH DOSE SEASONAL PF 09/10/2016  . Influenza Split 06/04/2012  . Influenza,inj,Quad PF,6+ Mos 07/04/2013, 08/18/2015, 07/13/2017, 06/19/2018  . Influenza-Unspecified  07/15/2020  . Moderna SARS-COV2 Booster Vaccination 05/19/2020, 02/19/2021  . Moderna Sars-Covid-2 Vaccination 10/22/2019, 11/19/2019, 04/02/2022  . Pneumococcal Conjugate-13 10/06/2011  . Pneumococcal Polysaccharide-23 06/19/2018  . Zoster, Live 01/14/2007   Pertinent  Health Maintenance Due  Topic Date Due  . Influenza Vaccine  05/04/2024  . DEXA SCAN  Completed      04/26/2022    8:24 AM 12/22/2022    9:42 AM 04/28/2023    9:28 AM 12/20/2023   10:47 AM 01/25/2024    3:27 PM  Fall Risk  Falls in the past year? 0 0 0 0 0  Was there an injury with Fall? 0 0 0 0 0  Fall Risk Category Calculator 0 0 0 0 0  Fall Risk Category (Retired) Low       Patient at Risk for Falls Due to No Fall Risks No Fall Risks No Fall Risks No Fall Risks No Fall Risks  Fall risk Follow up Falls evaluation completed  Falls evaluation completed Falls prevention discussed;Falls evaluation completed Falls evaluation completed Falls evaluation completed     Data saved with a previous flowsheet row definition      Vitals:   06/18/24 0946  BP: (!) 123/58  Pulse: 72  Resp: 15  Temp: 97.6 F (36.4 C)  SpO2: 93%  Weight: 101 lb 9.6 oz (46.1 kg)  Height: 5' 8 (1.727 m)   Body mass index is 15.45 kg/m.  Physical Exam Constitutional:      General: She is not in acute distress.    Appearance: Normal appearance.  HENT:     Head: Normocephalic and atraumatic.     Nose: Nose normal.     Mouth/Throat:     Mouth: Mucous membranes are moist.  Eyes:     Conjunctiva/sclera: Conjunctivae normal.  Cardiovascular:     Rate and Rhythm: Normal rate and regular rhythm.  Pulmonary:     Effort: Pulmonary effort is normal.     Breath sounds: Normal breath sounds.  Abdominal:     General: Bowel sounds are normal.     Palpations: Abdomen is soft.  Musculoskeletal:        General: Normal range of motion.     Cervical back: Normal range of motion.  Skin:    General: Skin is warm and dry.     Findings: Bruising present.     Comments: Bruising on BUE  Neurological:     General: No focal deficit present.     Mental Status: She is alert and oriented to person, place, and time.  Psychiatric:        Mood and Affect: Mood normal.        Behavior: Behavior normal.        Thought Content: Thought content normal.        Judgment: Judgment normal.        Labs reviewed: Recent Labs    06/07/24 1239 06/08/24 0635 06/12/24 0605  NA 129* 133* 135  K 4.1 3.7 3.9  CL 94* 93* 96*  CO2 25 30 31   GLUCOSE 119* 127* 95  BUN 10 12 17   CREATININE 0.71 0.47 0.58  CALCIUM 7.8* 7.5* 8.3*  MG 2.0 1.8 2.0  PHOS 2.4* 3.4 2.8   Recent Labs    06/05/24 1258  AST 23  ALT 17  ALKPHOS 110  BILITOT 0.9  PROT 6.4*  ALBUMIN 2.4*   Recent Labs    07/08/23 1001 10/07/23 0956 06/05/24 1258 06/06/24 0517 06/07/24 1239 06/08/24 9364 06/12/24  0605  WBC 6.1   < > 10.6*   < > 12.7* 11.4* 8.7  NEUTROABS 3.3  --  9.4*  --   --   --   --   HGB 10.7*   < > 9.5*   < > 9.8* 8.0* 8.3*  HCT 32.5*   < > 28.1*    < > 29.1* 23.4* 25.9*  MCV 113.2*   < > 102.2*   < > 102.1* 101.7* 104.4*  PLT 275   < > 356   < > 465* 342 343   < > = values in this interval not displayed.   Lab Results  Component Value Date   TSH 1.806 05/24/2024   No results found for: HGBA1C No results found for: CHOL, HDL, LDLCALC, LDLDIRECT, TRIG, CHOLHDL  Significant Diagnostic Results in last 30 days:  DG Abd 1 View Result Date: 06/10/2024 CLINICAL DATA:  Possible fecal impaction or small bowel obstruction. EXAM: ABDOMEN - 1 VIEW COMPARISON:  None Available. FINDINGS: No abnormal bowel dilatation. Mild amount of stool seen throughout the colon. Phleboliths are noted in the pelvis. IMPRESSION: No abnormal bowel dilatation. Mild stool burden. Electronically Signed   By: Lynwood Landy Raddle M.D.   On: 06/10/2024 13:07   CT Angio Chest Pulmonary Embolism (PE) W or WO Contrast Result Date: 06/07/2024 CLINICAL DATA:  Pulmonary embolism suspected high probability. History of interstitial lung disease presenting with cough and dizziness EXAM: CT ANGIOGRAPHY CHEST WITH CONTRAST TECHNIQUE: Multidetector CT imaging of the chest was performed using the standard protocol during bolus administration of intravenous contrast. Multiplanar CT image reconstructions and MIPs were obtained to evaluate the vascular anatomy. RADIATION DOSE REDUCTION: This exam was performed according to the departmental dose-optimization program which includes automated exposure control, adjustment of the mA and/or kV according to patient size and/or use of iterative reconstruction technique. CONTRAST:  75mL OMNIPAQUE  IOHEXOL  350 MG/ML SOLN COMPARISON:  Same day chest radiograph and prior studies FINDINGS: Cardiovascular: Motion artifact and contrast bolus timing limits evaluation specifically of the bilateral lower lobe pulmonary artery branches. No definite pulmonary embolism identified elsewhere. Mediastinum/Nodes: Redemonstration of marked dilation of the proximal  esophagus. Unchanged mildly enlarged mediastinal lymph nodes with a right paratracheal node measuring up to 1.0 cm in short axis, possibly reactive. Lungs/Pleura: Extensive bibasilar airspace disease with slightly increased consolidation in the bilateral lower lobes. Extensive bronchiectasis at the bilateral lower lobes with peripheral reticular opacities throughout the lungs, similar to prior. Small right and trace left pleural effusion. Upper Abdomen: No acute findings. Musculoskeletal: No acute osseous findings. Review of the MIP images confirms the above findings. IMPRESSION: 1. Limited examination with nondiagnostic evaluation of the bilateral lower lobe pulmonary artery branches due to motion artifact and contrast bolus timing. However, no definite pulmonary embolism identified in the remaining pulmonary arteries bilaterally. 2. Extensive lower lobe predominant pulmonary opacities bilaterally, slightly increased compared to prior and possibly related infection. 3.  Small right and trace left pleural effusions. Aortic Atherosclerosis (ICD10-I70.0). Electronically Signed   By: Michaeline Blanch M.D.   On: 06/07/2024 15:52   US  EKG SITE RITE Result Date: 06/07/2024 If Site Rite image not attached, placement could not be confirmed due to current cardiac rhythm.  DG Chest Port 1 View Result Date: 06/07/2024 CLINICAL DATA:  Shortness of breath. EXAM: PORTABLE CHEST 1 VIEW COMPARISON:  June 05, 2024. FINDINGS: The heart size and mediastinal contours are within normal limits. Stable bibasilar infiltrates or edema is noted with stable right pleural effusion.  The visualized skeletal structures are unremarkable. IMPRESSION: Stable bibasilar infiltrates or edema is noted with stable right pleural effusion. Electronically Signed   By: Lynwood Landy Raddle M.D.   On: 06/07/2024 12:58   ECHOCARDIOGRAM COMPLETE Result Date: 06/06/2024    ECHOCARDIOGRAM REPORT   Patient Name:   Melaney Lininger Date of Exam: 06/06/2024 Medical Rec  #:  [id number]      Height:       68.0 in Accession #:    [id number]     Weight:       108.0 lb Date of Birth:  05/11/1932      BSA:          1.573 m Patient Age:    89 years       BP:           122/50 mmHg Patient Gender: F              HR:           80 bpm. Exam Location:  [facility] Procedure: 2D Echo, Cardiac Doppler, 3D Echo, Color Doppler and Strain Analysis            (Both Spectral and Color Flow Doppler were utilized during            procedure). Indications:     Atrial Fibrillation I48.91  History:         Patient has prior history of Echocardiogram examinations, most                  recent 01/21/2022. Signs/Symptoms:Chest Pain and Dyspnea.  Sonographer:     Christopher Furnace Referring Phys:  [id number] ELVAN SOR Diagnosing Phys: Evalene Lunger MD  Sonographer Comments: Global longitudinal strain was attempted. IMPRESSIONS  1. Left ventricular ejection fraction, by estimation, is 60 to 65%. The left ventricle has normal function. The left ventricle has no regional wall motion abnormalities. Left ventricular diastolic parameters are consistent with Grade I diastolic dysfunction (impaired relaxation). The average left ventricular global longitudinal strain is -14.1 %.  2. Right ventricular systolic function is normal. The right ventricular size is normal.  3. The mitral valve is normal in structure. Mild to moderate mitral valve regurgitation. No evidence of mitral stenosis.  4. The aortic valve is normal in structure. Aortic valve regurgitation is not visualized. Aortic valve sclerosis is present, with no evidence of aortic valve stenosis.  5. The inferior vena cava is normal in size with greater than 50% respiratory variability, suggesting right atrial pressure of 3 mmHg. FINDINGS  Left Ventricle: Left ventricular ejection fraction, by estimation, is 60 to 65%. The left ventricle has normal function. The left ventricle has no regional wall motion abnormalities. The average left ventricular global longitudinal strain is  -14.1 %. Strain was performed and the global longitudinal strain is indeterminate. The left ventricular internal cavity size was normal in size. There is no left ventricular hypertrophy. Left ventricular diastolic parameters are consistent with Grade I diastolic dysfunction (impaired relaxation). Right Ventricle: The right ventricular size is normal. No increase in right ventricular wall thickness. Right ventricular systolic function is normal. Left Atrium: Left atrial size was normal in size. Right Atrium: Right atrial size was normal in size. Pericardium: There is no evidence of pericardial effusion. Mitral Valve: The mitral valve is normal in structure. Mild mitral annular calcification. Mild to moderate mitral valve regurgitation. No evidence of mitral valve stenosis. MV peak gradient, 6.0 mmHg. The mean mitral valve gradient is 2.0 mmHg. Tricuspid Valve: The  tricuspid valve is normal in structure. Tricuspid valve regurgitation is mild . No evidence of tricuspid stenosis. Aortic Valve: The aortic valve is normal in structure. Aortic valve regurgitation is not visualized. Aortic valve sclerosis is present, with no evidence of aortic valve stenosis. Aortic valve mean gradient measures 3.5 mmHg. Aortic valve peak gradient measures 6.0 mmHg. Aortic valve area, by VTI measures 3.30 cm. Pulmonic Valve: The pulmonic valve was normal in structure. Pulmonic valve regurgitation is not visualized. No evidence of pulmonic stenosis. Aorta: The aortic root is normal in size and structure. Venous: The inferior vena cava is normal in size with greater than 50% respiratory variability, suggesting right atrial pressure of 3 mmHg. IAS/Shunts: No atrial level shunt detected by color flow Doppler. Additional Comments: 3D was performed not requiring image post processing on an independent workstation and was indeterminate.  LEFT VENTRICLE PLAX 2D LVIDd:         2.60 cm   Diastology LVIDs:         1.90 cm   LV e' medial:    8.27 cm/s  LV PW:         1.20 cm   LV E/e' medial:  10.1 LV IVS:        1.40 cm   LV e' lateral:   6.53 cm/s LVOT diam:     1.90 cm   LV E/e' lateral: 12.8 LV SV:         76 LV SV Index:   48        2D Longitudinal Strain LVOT Area:     2.84 cm  2D Strain GLS Avg:     -14.1 %  RIGHT VENTRICLE RV Basal diam:  3.70 cm RV Mid diam:    1.80 cm RV S prime:     13.60 cm/s TAPSE (M-mode): 2.1 cm LEFT ATRIUM           Index        RIGHT ATRIUM           Index LA diam:      3.00 cm 1.91 cm/m   RA Area:     13.50 cm LA Vol (A4C): 52.5 ml 33.38 ml/m  RA Volume:   32.00 ml  20.35 ml/m  AORTIC VALVE AV Area (Vmax):    2.89 cm AV Area (Vmean):   2.61 cm AV Area (VTI):     3.30 cm AV Vmax:           122.50 cm/s AV Vmean:          88.100 cm/s AV VTI:            0.230 m AV Peak Grad:      6.0 mmHg AV Mean Grad:      3.5 mmHg LVOT Vmax:         125.00 cm/s LVOT Vmean:        81.100 cm/s LVOT VTI:          0.268 m LVOT/AV VTI ratio: 1.16  AORTA Ao Root diam: 3.20 cm MITRAL VALVE                TRICUSPID VALVE MV Area (PHT): 3.60 cm     TR Peak grad:   33.6 mmHg MV Area VTI:   2.87 cm     TR Vmax:        290.00 cm/s MV Peak grad:  6.0 mmHg MV Mean grad:  2.0 mmHg     SHUNTS MV Vmax:  1.22 m/s     Systemic VTI:  0.27 m MV Vmean:      69.8 cm/s    Systemic Diam: 1.90 cm MV Decel Time: 211 msec MV E velocity: 83.80 cm/s MV A velocity: 121.00 cm/s MV E/A ratio:  0.69 Evalene Lunger MD Electronically signed by Evalene Lunger MD Signature Date/Time: 06/06/2024/2:10:32 PM    Final    US  Venous Img Lower Bilateral Result Date: 06/05/2024 CLINICAL DATA:  elevated d-dimer EXAM: BILATERAL LOWER EXTREMITY VENOUS DOPPLER ULTRASOUND TECHNIQUE: Gray-scale sonography with graded compression, as well as color Doppler and duplex ultrasound were performed to evaluate the lower extremity deep venous systems from the level of the common femoral vein and including the common femoral, femoral, profunda femoral, popliteal and calf veins including the  posterior tibial, peroneal and gastrocnemius veins when visible. The superficial great saphenous vein was also interrogated. Spectral Doppler was utilized to evaluate flow at rest and with distal augmentation maneuvers in the common femoral, femoral and popliteal veins. COMPARISON:  January 12, 2022 FINDINGS: RIGHT LOWER EXTREMITY Common Femoral Vein: No evidence of thrombus. Normal compressibility, respiratory phasicity and response to augmentation. Saphenofemoral Junction: No evidence of thrombus. Normal compressibility and flow on color Doppler imaging. Profunda Femoral Vein: No evidence of thrombus. Normal compressibility and flow on color Doppler imaging. Femoral Vein: No evidence of thrombus. Normal compressibility, respiratory phasicity and response to augmentation. Popliteal Vein: No evidence of thrombus. Normal compressibility, respiratory phasicity and response to augmentation. Calf Veins: No evidence of thrombus. Normal compressibility and flow on color Doppler imaging. Superficial Great Saphenous Vein: No evidence of thrombus. Normal compressibility. Other Findings:  The small saphenous vein is thrombosed. LEFT LOWER EXTREMITY Common Femoral Vein: No evidence of thrombus. Normal compressibility, respiratory phasicity and response to augmentation. Saphenofemoral Junction: No evidence of thrombus. Normal compressibility and flow on color Doppler imaging. Profunda Femoral Vein: No evidence of thrombus. Normal compressibility and flow on color Doppler imaging. Femoral Vein: No evidence of thrombus. Normal compressibility, respiratory phasicity and response to augmentation. Popliteal Vein: No evidence of thrombus. Normal compressibility, respiratory phasicity and response to augmentation. Calf Veins: No evidence of thrombus. Normal compressibility and flow on color Doppler imaging. Superficial Great Saphenous Vein: No evidence of thrombus. Normal compressibility. Other Findings: Likely popliteal cysts  bilaterally, measuring up to 3.5 cm on the right. IMPRESSION: 1. Negative for deep venous thrombosis within both legs. 2. Positive for superficial venous thrombosis in the small saphenous vein on the right. Electronically Signed   By: Rogelia Myers M.D.   On: 06/05/2024 18:32   CT Angio Chest PE W and/or Wo Contrast Result Date: 06/05/2024 CLINICAL DATA:  Elevated D-dimer.  Concern for pulmonary embolism EXAM: CT ANGIOGRAPHY CHEST WITH CONTRAST TECHNIQUE: Multidetector CT imaging of the chest was performed using the standard protocol during bolus administration of intravenous contrast. Multiplanar CT image reconstructions and MIPs were obtained to evaluate the vascular anatomy. RADIATION DOSE REDUCTION: This exam was performed according to the departmental dose-optimization program which includes automated exposure control, adjustment of the mA and/or kV according to patient size and/or use of iterative reconstruction technique. CONTRAST:  75mL OMNIPAQUE  IOHEXOL  350 MG/ML SOLN COMPARISON:  Chest CT 05/05/2021 FINDINGS: Cardiovascular: No filling defects within the pulmonary arteries to suggest acute pulmonary embolism. Mediastinum/Nodes: Marked dilatation the proximal esophagus. The esophagus above the carina measures 5.4 cm in diameter. There is gas filled esophagus and GE junction. The more distal esophagus is normal caliber at 1.2 cm. No fluid esophagus. No food stuff  esophagus. Esophageal dilatation is similar tube of progressed from CT 2022. No mediastinal adenopathy.  Trachea normal Lungs/Pleura: There is dense bibasilar airspace disease airspace disease extends into the lingula and RIGHT middle lobe. Six dense of bronchiectasis in the lower lobes. Upper Abdomen: Limited view of the liver, kidneys, pancreas are unremarkable. Normal adrenal glands. Musculoskeletal: Very little body fat. No aggressive osseous lesion compression fracture of the lower thoracic spine appears chronic. No acute fracture Review of  the MIP images confirms the above findings. IMPRESSION: 1. No evidence acute pulmonary embolism. 2. Dense bibasilar airspace disease with bronchiectasis. Findings concerning severe bibasilar pneumonia. 3. Marked dilatation of the proximal esophagus. No fluid in the esophagus. No evidence of esophageal obstruction. 4. Very little body fat. Electronically Signed   By: Jackquline Boxer M.D.   On: 06/05/2024 17:01   DG Chest 1 View Result Date: 06/05/2024 CLINICAL DATA:  Dizziness, productive cough, COPD; evaluate for pleural edema EXAM: CHEST  1 VIEW COMPARISON:  November 04, 2023 May 05, 2021 FINDINGS: Biapical pleuroparenchymal scarring. Chronic changes of interstitial lung disease again noted. Increasing patchy airspace opacities in the right mid and left lower lung zones. No pneumothorax. Chronic blunting of the right costophrenic sulcus, unchanged. No cardiomegaly. Aortic atherosclerosis. No acute fracture or destructive lesions. Multilevel thoracic osteophytosis. Osteopenia. IMPRESSION: Underlying changes of chronic interstitial lung disease again noted. Increasing patchy airspace opacities in the right mid and left lower lung zones, worrisome for superimposed bronchopneumonia. Electronically Signed   By: Rogelia Myers M.D.   On: 06/05/2024 14:24    Assessment/Plan ***   Family/ staff Communication: Discussed plan of care with resident and charge nurse  Labs/tests ordered:     Angellynn Kimberlin Medina-Vargas, DNP, MSN, FNP-BC [facility] [phone] (Monday-Friday 8:00 a.m. - 5:00 p.m.) [phone] (after hours)

## 2024-06-27 ENCOUNTER — Encounter: Payer: Self-pay | Admitting: Nurse Practitioner

## 2024-06-27 ENCOUNTER — Ambulatory Visit: Admitting: Nurse Practitioner

## 2024-06-27 ENCOUNTER — Non-Acute Institutional Stay (SKILLED_NURSING_FACILITY): Payer: Self-pay | Admitting: Nurse Practitioner

## 2024-06-27 DIAGNOSIS — R251 Tremor, unspecified: Secondary | ICD-10-CM

## 2024-06-27 DIAGNOSIS — R634 Abnormal weight loss: Secondary | ICD-10-CM | POA: Diagnosis not present

## 2024-06-27 DIAGNOSIS — I4891 Unspecified atrial fibrillation: Secondary | ICD-10-CM | POA: Diagnosis not present

## 2024-06-27 DIAGNOSIS — Q141 Congenital malformation of retina: Secondary | ICD-10-CM | POA: Diagnosis not present

## 2024-06-27 DIAGNOSIS — Z961 Presence of intraocular lens: Secondary | ICD-10-CM | POA: Diagnosis not present

## 2024-06-27 DIAGNOSIS — R42 Dizziness and giddiness: Secondary | ICD-10-CM | POA: Diagnosis not present

## 2024-06-27 DIAGNOSIS — H43813 Vitreous degeneration, bilateral: Secondary | ICD-10-CM | POA: Diagnosis not present

## 2024-06-27 DIAGNOSIS — H353131 Nonexudative age-related macular degeneration, bilateral, early dry stage: Secondary | ICD-10-CM | POA: Diagnosis not present

## 2024-06-27 NOTE — Progress Notes (Deleted)
 Office Visit    Patient Name: Crystal Gregory Date of Encounter: 06/27/2024  Primary Care Provider:  [name], NP Primary Cardiologist:  Evalene Lunger, MD    Chief Complaint    89 y.o. female   Past Medical History   Subjective   Past Medical History:  Diagnosis Date   Allergy    Anal fissure    Aspiration into airway    CAP (community acquired pneumonia) 08/23/2017   Chest pain    a. 09/04/2018 MV: EF >65%. No ischemia/infarct.   Coronary artery calcification seen on CT scan    Diastolic dysfunction    a. 10/2016 Echo: EF 60-65%, no rwma, Gr1 DD, mild MR, nl RV fxn, mild to mod TR, PASP .   Diverticulosis    Dyspnea    Edema of right lower extremity 01/29/2022   Fever 06/15/2023   GERD (gastroesophageal reflux disease)    Hemorrhoids    Hyperplastic colon polyp    Interstitial lung disorders (HCC)    Pulmonary fibrosis (HCC)    Septicemia (HCC) 1999   spent 4 weeks in ICU, intubated -? PNA   Vertebral fracture, pathological    Past Surgical History:  Procedure Laterality Date   ABDOMINAL HYSTERECTOMY     FLEXIBLE BRONCHOSCOPY N/A 07/05/2016   Procedure: FLEXIBLE BRONCHOSCOPY;  Surgeon: Jorie Cha, MD;  Location: [facility] ORS;  Service: Cardiopulmonary;  Laterality: N/A;   IR RADIOLOGIST EVAL & MGMT  11/05/2021    Allergies  No Known Allergies     History of Present Illness      89 y.o. y/o female {There is no content from the last Narrative History section.}     Objective   Home Medications    Current Outpatient Medications  Medication Sig Dispense Refill   acetaminophen  (TYLENOL ) 500 MG tablet Take 500 mg by mouth 3 (three) times daily.     amiodarone  (PACERONE ) 200 MG tablet Take 1 tablet (200 mg total) by mouth 2 (two) times daily for 6 days.     amiodarone  (PACERONE ) 200 MG tablet Take 1 tablet (200 mg total) by mouth daily.     apixaban  (ELIQUIS ) 2.5 MG TABS tablet Take 1 tablet (2.5 mg total) by mouth 2 (two) times daily.      azaTHIOprine  (IMURAN ) 50 MG tablet TAKE ONE AND ONE-HALF TABLETS (75 MG) EVERY MORNING AND 1 TABLET (50 MG) EVERY EVENING (Patient taking differently: Take 50 mg by mouth daily. TAKE ONE AND ONE-HALF TABLETS (75 MG) EVERY MORNING AND 1 TABLET (50 MG) EVERY EVENING) 225 tablet 0   bisacodyl  (DULCOLAX) 5 MG EC tablet Take 2 tablets (10 mg total) by mouth at bedtime.     Calcium Carb-Cholecalciferol (CALCIUM 600 + D PO) Take 1 tablet by mouth daily.     cyanocobalamin  (VITAMIN B12) 500 MCG tablet Take 1 tablet (500 mcg total) by mouth daily.     denosumab  (PROLIA ) 60 MG/ML SOLN injection Inject 60 mg into the skin every 6 (six) months. Administer in upper arm, thigh, or abdomen     diphenhydrAMINE  (BENADRYL ) 12.5 MG/5ML elixir Take 5 mLs (12.5 mg total) by mouth every 8 (eight) hours as needed (Vertigo). (Patient not taking: Reported on 06/18/2024)     esomeprazole  (NEXIUM ) 20 MG capsule Take 1 capsule (20 mg total) by mouth 2 (two) times daily before a meal. 180 capsule 3   gabapentin  (NEURONTIN ) 100 MG capsule Take 2 capsules (200 mg total) by mouth at bedtime. 180 capsule 3  LORazepam  (ATIVAN ) 0.5 MG tablet Take 1 tablet (0.5 mg total) by mouth every 8 (eight) hours as needed (Vertigo). (Patient not taking: Reported on 06/18/2024) 10 tablet 0   metoCLOPramide  (REGLAN ) 5 MG tablet Take 1 tablet (5 mg total) by mouth every 8 (eight) hours as needed for nausea or vomiting (and Vertigo).     montelukast  (SINGULAIR ) 10 MG tablet Take 1 tablet (10 mg total) by mouth daily. 90 tablet 4   ondansetron  (ZOFRAN ) 4 MG tablet Take 1 tablet (4 mg total) by mouth daily as needed for nausea or vomiting. 30 tablet 1   oxyCODONE  (OXY IR/ROXICODONE ) 5 MG immediate release tablet Take 1 tablet (5 mg total) by mouth every 6 (six) hours as needed for moderate pain (pain score 4-6) or severe pain (pain score 7-10). 10 tablet 0   polyethylene glycol (MIRALAX  / GLYCOLAX ) 17 g packet Take 17 g by mouth 2 (two) times daily.      predniSONE  (DELTASONE ) 1 MG tablet Take 3 mg by mouth daily with breakfast.     [START ON 07/07/2024] sertraline (ZOLOFT) 100 MG tablet Take 100 mg by mouth daily.     sertraline (ZOLOFT) 25 MG tablet Take 25 mg by mouth daily.     sertraline (ZOLOFT) 50 MG tablet Take 50 mg by mouth daily.     [START ON 06/30/2024] SERTRALINE HCL PO Take 75 mg by mouth daily.     traZODone  (DESYREL ) 50 MG tablet Take 50 mg by mouth at bedtime.     Current Facility-Administered Medications  Medication Dose Route Frequency Provider Last Rate Last Admin   denosumab  (PROLIA ) injection 60 mg  60 mg Subcutaneous Once Clark, Katherine K, NP       [START ON 08/04/2024] denosumab  (PROLIA ) injection 60 mg  60 mg Subcutaneous Once [name], NP         Physical Exam    VS:  There were no vitals taken for this visit. , BMI There is no height or weight on file to calculate BMI.          GEN: Well nourished, well developed, in no acute distress. HEENT: normal. Neck: Supple, no JVD, carotid bruits, or masses. Cardiac: RRR, no murmurs, rubs, or gallops. No clubbing, cyanosis, edema.  Radials 2+/PT 2+ and equal bilaterally.  Respiratory:  Respirations regular and unlabored, clear to auscultation bilaterally. GI: Soft, nontender, nondistended, BS + x 4. MS: no deformity or atrophy. Skin: warm and dry, no rash. Neuro:  Strength and sensation are intact. Psych: Normal affect.  Accessory Clinical Findings    ECG personally reviewed by me today -    *** - no acute changes.  Lab Results  Component Value Date   WBC 8.7 06/12/2024   HGB 8.3 (L) 06/12/2024   HCT 25.9 (L) 06/12/2024   MCV 104.4 (H) 06/12/2024   PLT 343 06/12/2024   Lab Results  Component Value Date   CREATININE 0.58 06/12/2024   BUN 17 06/12/2024   NA 135 06/12/2024   K 3.9 06/12/2024   CL 96 (L) 06/12/2024   CO2 31 06/12/2024   Lab Results  Component Value Date   ALT 17 06/05/2024   AST 23 06/05/2024   ALKPHOS 110 06/05/2024    BILITOT 0.9 06/05/2024   No results found for: CHOL, HDL, LDLCALC, LDLDIRECT, TRIG, CHOLHDL  No results found for: HGBA1C Lab Results  Component Value Date   TSH 1.806 05/24/2024       Assessment & Plan  1.  ***  Lonni Meager, NP 06/27/2024, 9:47 AM

## 2024-06-27 NOTE — Progress Notes (Unsigned)
 Location:  Other [facility].  Nursing Home Room Number: [facility] [id number] Place of Service:  SNF [phone]) Harlene An, NP  PCP: [name], NP  Patient Care Team: [name], NP as PCP - General (Internal Medicine) Perla Evalene PARAS, MD as PCP - Cardiology (Cardiology) Perla Evalene PARAS, MD as Consulting Physician (Cardiology) Dolphus Reiter, MD as Consulting Physician (Rheumatology) Tamea Dedra CROME, MD as Consulting Physician (Pulmonary Disease)  Extended Emergency Contact Information Primary Emergency Contact: [facility] Address: [address]          [place], [place] [address] [place] Home Phone: [phone] Mobile Phone: [phone] Relation: Daughter  Goals of care: Advanced Directive information    06/18/2024    9:35 AM  Advanced Directives  Does Patient Have a Medical Advance Directive? Yes  Type of Advance Directive Out of facility DNR (pink MOST or yellow form)  Does patient want to make changes to medical advance directive? No - Patient declined  Would patient like information on creating a medical advance directive? No - Patient declined     Chief Complaint  Patient presents with   Tremors    Tremors    HPI:  Pt is a 89 y.o. female seen today for an acute visit for tremors.  Pt is at [facility] for short term rehab after hospitalization for pneumonia. During hospitalization went into a fib with RVR. Started on amiodarone  200 mg BID and has titrated down to 100 mg daily. She will be on this for 1-2 months and followed by cardiology.  During hospitalization propranolol  was stopped which she was taking for tremors. Now she is unable to feed her self due to progression of tremors and requesting to restart.   She has had chronic dizziness and reports while doing PT this has improved- they are also working with exercises for vertigo.   She has had a 4 lb weight loss since here. Working towards eating more and  on supplements.   No palpitations or chest pains noted   Past Medical History:  Diagnosis Date   Allergy    Anal fissure    Aspiration into airway    CAP (community acquired pneumonia) 08/23/2017   Chest pain    a. 09/04/2018 MV: EF >65%. No ischemia/infarct.   Coronary artery calcification seen on CT scan    Diastolic dysfunction    a. 10/2016 Echo: EF 60-65%, no rwma, Gr1 DD, mild MR, nl RV fxn, mild to mod TR, PASP .   Diverticulosis    Dyspnea    Edema of right lower extremity 01/29/2022   Fever 06/15/2023   GERD (gastroesophageal reflux disease)    Hemorrhoids    Hyperplastic colon polyp    Interstitial lung disorders (HCC)    Pulmonary fibrosis (HCC)    Septicemia (HCC) 1999   spent 4 weeks in ICU, intubated -? PNA   Vertebral fracture, pathological    Past Surgical History:  Procedure Laterality Date   ABDOMINAL HYSTERECTOMY     FLEXIBLE BRONCHOSCOPY N/A 07/05/2016   Procedure: FLEXIBLE BRONCHOSCOPY;  Surgeon: Jorie Cha, MD;  Location: [facility] ORS;  Service: Cardiopulmonary;  Laterality: N/A;   IR RADIOLOGIST EVAL & MGMT  11/05/2021    No Known Allergies  Outpatient Encounter Medications as of 06/27/2024  Medication Sig   acetaminophen  (TYLENOL ) 500 MG tablet Take 1,000 mg by mouth every 8 (eight) hours as needed.   amiodarone  (PACERONE ) 200 MG tablet Take 1 tablet (200 mg total) by  mouth daily.   apixaban  (ELIQUIS ) 2.5 MG TABS tablet Take 1 tablet (2.5 mg total) by mouth 2 (two) times daily.   azaTHIOprine  (IMURAN ) 50 MG tablet Take 50 mg by mouth 2 (two) times daily.   bisacodyl  (DULCOLAX) 5 MG EC tablet Take 2 tablets (10 mg total) by mouth at bedtime.   Calcium Carb-Cholecalciferol (CALCIUM 600 + D PO) Take 1 tablet by mouth daily.   cyanocobalamin  (VITAMIN B12) 500 MCG tablet Take 1 tablet (500 mcg total) by mouth daily.   esomeprazole  (NEXIUM ) 20 MG capsule Take 1 capsule (20 mg total) by mouth 2 (two) times daily before a meal.   gabapentin  (NEURONTIN )  100 MG capsule Take 100 mg by mouth at bedtime.   ipratropium-albuterol  (DUONEB) 0.5-2.5 (3) MG/3ML SOLN Take 3 mLs by nebulization every 6 (six) hours as needed.   loratadine (CLARITIN) 10 MG tablet Take 10 mg by mouth daily.   metoCLOPramide  (REGLAN ) 5 MG tablet Take 1 tablet (5 mg total) by mouth every 8 (eight) hours as needed for nausea or vomiting (and Vertigo).   montelukast  (SINGULAIR ) 10 MG tablet Take 1 tablet (10 mg total) by mouth daily.   oxyCODONE  (OXY IR/ROXICODONE ) 5 MG immediate release tablet Take 5 mg by mouth every 12 (twelve) hours as needed for severe pain (pain score 7-10).   polyethylene glycol (MIRALAX  / GLYCOLAX ) 17 g packet Take 17 g by mouth 2 (two) times daily.   predniSONE  (DELTASONE ) 1 MG tablet Take 3 mg by mouth daily with breakfast. Every Monday, Wednesday, Friday and Sunday. Give 6mg  by mouth in the morning every Tuesday, Thursday and Saturday.   sertraline (ZOLOFT) 50 MG tablet Take 50 mg by mouth daily.   traZODone  (DESYREL ) 50 MG tablet Take 50 mg by mouth at bedtime.   amiodarone  (PACERONE ) 200 MG tablet Take 1 tablet (200 mg total) by mouth 2 (two) times daily for 6 days. (Patient not taking: Reported on 06/27/2024)   azaTHIOprine  (IMURAN ) 50 MG tablet TAKE ONE AND ONE-HALF TABLETS (75 MG) EVERY MORNING AND 1 TABLET (50 MG) EVERY EVENING (Patient not taking: Reported on 06/27/2024)   denosumab  (PROLIA ) 60 MG/ML SOLN injection Inject 60 mg into the skin every 6 (six) months. Administer in upper arm, thigh, or abdomen (Patient not taking: Reported on 06/27/2024)   diphenhydrAMINE  (BENADRYL ) 12.5 MG/5ML elixir Take 5 mLs (12.5 mg total) by mouth every 8 (eight) hours as needed (Vertigo). (Patient not taking: Reported on 06/27/2024)   gabapentin  (NEURONTIN ) 100 MG capsule Take 2 capsules (200 mg total) by mouth at bedtime. (Patient not taking: Reported on 06/27/2024)   LORazepam  (ATIVAN ) 0.5 MG tablet Take 1 tablet (0.5 mg total) by mouth every 8 (eight) hours as needed  (Vertigo). (Patient not taking: Reported on 06/27/2024)   ondansetron  (ZOFRAN ) 4 MG tablet Take 1 tablet (4 mg total) by mouth daily as needed for nausea or vomiting. (Patient not taking: Reported on 06/27/2024)   oxyCODONE  (OXY IR/ROXICODONE ) 5 MG immediate release tablet Take 1 tablet (5 mg total) by mouth every 6 (six) hours as needed for moderate pain (pain score 4-6) or severe pain (pain score 7-10). (Patient not taking: Reported on 06/27/2024)   [START ON 07/07/2024] sertraline (ZOLOFT) 100 MG tablet Take 100 mg by mouth daily. (Patient not taking: Reported on 06/27/2024)   sertraline (ZOLOFT) 25 MG tablet Take 25 mg by mouth daily. (Patient not taking: Reported on 06/27/2024)   [START ON 06/30/2024] SERTRALINE HCL PO Take 75 mg by mouth daily. (Patient  not taking: Reported on 06/27/2024)   Facility-Administered Encounter Medications as of 06/27/2024  Medication   denosumab  (PROLIA ) injection 60 mg   [START ON 08/04/2024] denosumab  (PROLIA ) injection 60 mg    Review of Systems  Constitutional:  Positive for appetite change and unexpected weight change. Negative for chills and fever.  HENT:  Negative for tinnitus.   Respiratory:  Negative for cough and shortness of breath.   Cardiovascular:  Negative for chest pain, palpitations and leg swelling.  Gastrointestinal:  Negative for abdominal pain, constipation and diarrhea.  Genitourinary:  Negative for dysuria, frequency and urgency.  Musculoskeletal:  Negative for back pain and myalgias.  Skin: Negative.   Neurological:  Positive for dizziness and tremors. Negative for headaches.    Immunization History  Administered Date(s) Administered   Fluad Quad(high Dose 65+) 07/24/2019, 07/04/2022, 07/28/2023   INFLUENZA, HIGH DOSE SEASONAL PF 09/10/2016   Influenza Split 06/04/2012   Influenza,inj,Quad PF,6+ Mos 07/04/2013, 08/18/2015, 07/13/2017, 06/19/2018   Influenza-Unspecified 07/15/2020   Moderna SARS-COV2 Booster Vaccination 05/19/2020,  02/19/2021   Moderna Sars-Covid-2 Vaccination 10/22/2019, 11/19/2019, 04/02/2022   Pneumococcal Conjugate-13 10/06/2011   Pneumococcal Polysaccharide-23 06/19/2018   Zoster, Live 01/14/2007   Pertinent  Health Maintenance Due  Topic Date Due   Influenza Vaccine  05/04/2024   DEXA SCAN  Completed      04/26/2022    8:24 AM 12/22/2022    9:42 AM 04/28/2023    9:28 AM 12/20/2023   10:47 AM 01/25/2024    3:27 PM  Fall Risk  Falls in the past year? 0 0 0 0 0  Was there an injury with Fall? 0 0 0 0 0  Fall Risk Category Calculator 0 0 0 0 0  Fall Risk Category (Retired) Low       Patient at Risk for Falls Due to No Fall Risks No Fall Risks No Fall Risks No Fall Risks No Fall Risks  Fall risk Follow up Falls evaluation completed  Falls evaluation completed Falls prevention discussed;Falls evaluation completed Falls evaluation completed Falls evaluation completed     Data saved with a previous flowsheet row definition   Functional Status Survey:    Vitals:   06/27/24 1032  BP: 121/72  Pulse: 73  Resp: (!) 22  Temp: 97.8 F (36.6 C)  SpO2: 94%  Weight: 96 lb (43.5 kg)  Height: 5' 8 (1.727 m)   Body mass index is 14.6 kg/m. Wt Readings from Last 3 Encounters:  06/27/24 96 lb (43.5 kg)  06/18/24 101 lb 9.6 oz (46.1 kg)  06/13/24 101 lb 9.6 oz (46.1 kg)    Physical Exam Constitutional:      General: She is not in acute distress.    Appearance: She is well-developed. She is not diaphoretic.  HENT:     Head: Normocephalic and atraumatic.     Mouth/Throat:     Pharynx: No oropharyngeal exudate.  Eyes:     Conjunctiva/sclera: Conjunctivae normal.     Pupils: Pupils are equal, round, and reactive to light.  Cardiovascular:     Rate and Rhythm: Normal rate and regular rhythm.     Heart sounds: Normal heart sounds.  Pulmonary:     Effort: Pulmonary effort is normal.     Breath sounds: Normal breath sounds.  Abdominal:     General: Bowel sounds are normal.     Palpations:  Abdomen is soft.  Musculoskeletal:     Cervical back: Normal range of motion and neck supple.  Right lower leg: No edema.     Left lower leg: No edema.  Skin:    General: Skin is warm and dry.  Neurological:     Mental Status: She is alert.  Psychiatric:        Mood and Affect: Mood normal.    Labs reviewed: Recent Labs    06/07/24 1239 06/08/24 0635 06/12/24 0605 06/14/24 0000  NA 129* 133* 135 137  K 4.1 3.7 3.9 4.2  CL 94* 93* 96* 97*  CO2 25 30 31  31*  GLUCOSE 119* 127* 95  --   BUN 10 12 17 17   CREATININE 0.71 0.47 0.58 0.5  CALCIUM 7.8* 7.5* 8.3* 8.4*  MG 2.0 1.8 2.0  --   PHOS 2.4* 3.4 2.8  --    Recent Labs    06/05/24 1258  AST 23  ALT 17  ALKPHOS 110  BILITOT 0.9  PROT 6.4*  ALBUMIN 2.4*   Recent Labs    06/05/24 1258 06/06/24 0517 06/07/24 1239 06/08/24 0635 06/12/24 0605 06/14/24 0000 06/21/24 0000  WBC 10.6*   < > 12.7* 11.4* 8.7 6.4 7.6  NEUTROABS 9.4*  --   --   --   --  4,250.00 6,050.00  HGB 9.5*   < > 9.8* 8.0* 8.3* 9.3* 9.9*  HCT 28.1*   < > 29.1* 23.4* 25.9* 29* 31*  MCV 102.2*   < > 102.1* 101.7* 104.4*  --   --   PLT 356   < > 465* 342 343 440* 343   < > = values in this interval not displayed.   Lab Results  Component Value Date   TSH 1.806 05/24/2024   No results found for: HGBA1C No results found for: CHOL, HDL, LDLCALC, LDLDIRECT, TRIG, CHOLHDL  Significant Diagnostic Results in last 30 days:  DG Abd 1 View Result Date: 06/10/2024 CLINICAL DATA:  Possible fecal impaction or small bowel obstruction. EXAM: ABDOMEN - 1 VIEW COMPARISON:  None Available. FINDINGS: No abnormal bowel dilatation. Mild amount of stool seen throughout the colon. Phleboliths are noted in the pelvis. IMPRESSION: No abnormal bowel dilatation. Mild stool burden. Electronically Signed   By: Lynwood Landy Raddle M.D.   On: 06/10/2024 13:07   CT Angio Chest Pulmonary Embolism (PE) W or WO Contrast Result Date: 06/07/2024 CLINICAL DATA:   Pulmonary embolism suspected high probability. History of interstitial lung disease presenting with cough and dizziness EXAM: CT ANGIOGRAPHY CHEST WITH CONTRAST TECHNIQUE: Multidetector CT imaging of the chest was performed using the standard protocol during bolus administration of intravenous contrast. Multiplanar CT image reconstructions and MIPs were obtained to evaluate the vascular anatomy. RADIATION DOSE REDUCTION: This exam was performed according to the departmental dose-optimization program which includes automated exposure control, adjustment of the mA and/or kV according to patient size and/or use of iterative reconstruction technique. CONTRAST:  75mL OMNIPAQUE  IOHEXOL  350 MG/ML SOLN COMPARISON:  Same day chest radiograph and prior studies FINDINGS: Cardiovascular: Motion artifact and contrast bolus timing limits evaluation specifically of the bilateral lower lobe pulmonary artery branches. No definite pulmonary embolism identified elsewhere. Mediastinum/Nodes: Redemonstration of marked dilation of the proximal esophagus. Unchanged mildly enlarged mediastinal lymph nodes with a right paratracheal node measuring up to 1.0 cm in short axis, possibly reactive. Lungs/Pleura: Extensive bibasilar airspace disease with slightly increased consolidation in the bilateral lower lobes. Extensive bronchiectasis at the bilateral lower lobes with peripheral reticular opacities throughout the lungs, similar to prior. Small right and trace left pleural effusion. Upper Abdomen: No  acute findings. Musculoskeletal: No acute osseous findings. Review of the MIP images confirms the above findings. IMPRESSION: 1. Limited examination with nondiagnostic evaluation of the bilateral lower lobe pulmonary artery branches due to motion artifact and contrast bolus timing. However, no definite pulmonary embolism identified in the remaining pulmonary arteries bilaterally. 2. Extensive lower lobe predominant pulmonary opacities  bilaterally, slightly increased compared to prior and possibly related infection. 3.  Small right and trace left pleural effusions. Aortic Atherosclerosis (ICD10-I70.0). Electronically Signed   By: Michaeline Blanch M.D.   On: 06/07/2024 15:52   US  EKG SITE RITE Result Date: 06/07/2024 If Site Rite image not attached, placement could not be confirmed due to current cardiac rhythm.  DG Chest Port 1 View Result Date: 06/07/2024 CLINICAL DATA:  Shortness of breath. EXAM: PORTABLE CHEST 1 VIEW COMPARISON:  June 05, 2024. FINDINGS: The heart size and mediastinal contours are within normal limits. Stable bibasilar infiltrates or edema is noted with stable right pleural effusion. The visualized skeletal structures are unremarkable. IMPRESSION: Stable bibasilar infiltrates or edema is noted with stable right pleural effusion. Electronically Signed   By: Lynwood Landy Raddle M.D.   On: 06/07/2024 12:58   ECHOCARDIOGRAM COMPLETE Result Date: 06/06/2024    ECHOCARDIOGRAM REPORT   Patient Name:   Emmerson Lipsitz Date of Exam: 06/06/2024 Medical Rec #:  [id number]      Height:       68.0 in Accession #:    [id number]     Weight:       108.0 lb Date of Birth:  1932-07-22      BSA:          1.573 m Patient Age:    89 years       BP:           122/50 mmHg Patient Gender: F              HR:           80 bpm. Exam Location:  [facility] Procedure: 2D Echo, Cardiac Doppler, 3D Echo, Color Doppler and Strain Analysis            (Both Spectral and Color Flow Doppler were utilized during            procedure). Indications:     Atrial Fibrillation I48.91  History:         Patient has prior history of Echocardiogram examinations, most                  recent 01/21/2022. Signs/Symptoms:Chest Pain and Dyspnea.  Sonographer:     Christopher Furnace Referring Phys:  [id number] ELVAN SOR Diagnosing Phys: Evalene Lunger MD  Sonographer Comments: Global longitudinal strain was attempted. IMPRESSIONS  1. Left ventricular ejection fraction, by estimation, is 60 to 65%.  The left ventricle has normal function. The left ventricle has no regional wall motion abnormalities. Left ventricular diastolic parameters are consistent with Grade I diastolic dysfunction (impaired relaxation). The average left ventricular global longitudinal strain is -14.1 %.  2. Right ventricular systolic function is normal. The right ventricular size is normal.  3. The mitral valve is normal in structure. Mild to moderate mitral valve regurgitation. No evidence of mitral stenosis.  4. The aortic valve is normal in structure. Aortic valve regurgitation is not visualized. Aortic valve sclerosis is present, with no evidence of aortic valve stenosis.  5. The inferior vena cava is normal in size with greater than 50% respiratory variability, suggesting right atrial pressure of 3  mmHg. FINDINGS  Left Ventricle: Left ventricular ejection fraction, by estimation, is 60 to 65%. The left ventricle has normal function. The left ventricle has no regional wall motion abnormalities. The average left ventricular global longitudinal strain is -14.1 %. Strain was performed and the global longitudinal strain is indeterminate. The left ventricular internal cavity size was normal in size. There is no left ventricular hypertrophy. Left ventricular diastolic parameters are consistent with Grade I diastolic dysfunction (impaired relaxation). Right Ventricle: The right ventricular size is normal. No increase in right ventricular wall thickness. Right ventricular systolic function is normal. Left Atrium: Left atrial size was normal in size. Right Atrium: Right atrial size was normal in size. Pericardium: There is no evidence of pericardial effusion. Mitral Valve: The mitral valve is normal in structure. Mild mitral annular calcification. Mild to moderate mitral valve regurgitation. No evidence of mitral valve stenosis. MV peak gradient, 6.0 mmHg. The mean mitral valve gradient is 2.0 mmHg. Tricuspid Valve: The tricuspid valve is  normal in structure. Tricuspid valve regurgitation is mild . No evidence of tricuspid stenosis. Aortic Valve: The aortic valve is normal in structure. Aortic valve regurgitation is not visualized. Aortic valve sclerosis is present, with no evidence of aortic valve stenosis. Aortic valve mean gradient measures 3.5 mmHg. Aortic valve peak gradient measures 6.0 mmHg. Aortic valve area, by VTI measures 3.30 cm. Pulmonic Valve: The pulmonic valve was normal in structure. Pulmonic valve regurgitation is not visualized. No evidence of pulmonic stenosis. Aorta: The aortic root is normal in size and structure. Venous: The inferior vena cava is normal in size with greater than 50% respiratory variability, suggesting right atrial pressure of 3 mmHg. IAS/Shunts: No atrial level shunt detected by color flow Doppler. Additional Comments: 3D was performed not requiring image post processing on an independent workstation and was indeterminate.  LEFT VENTRICLE PLAX 2D LVIDd:         2.60 cm   Diastology LVIDs:         1.90 cm   LV e' medial:    8.27 cm/s LV PW:         1.20 cm   LV E/e' medial:  10.1 LV IVS:        1.40 cm   LV e' lateral:   6.53 cm/s LVOT diam:     1.90 cm   LV E/e' lateral: 12.8 LV SV:         76 LV SV Index:   48        2D Longitudinal Strain LVOT Area:     2.84 cm  2D Strain GLS Avg:     -14.1 %  RIGHT VENTRICLE RV Basal diam:  3.70 cm RV Mid diam:    1.80 cm RV S prime:     13.60 cm/s TAPSE (M-mode): 2.1 cm LEFT ATRIUM           Index        RIGHT ATRIUM           Index LA diam:      3.00 cm 1.91 cm/m   RA Area:     13.50 cm LA Vol (A4C): 52.5 ml 33.38 ml/m  RA Volume:   32.00 ml  20.35 ml/m  AORTIC VALVE AV Area (Vmax):    2.89 cm AV Area (Vmean):   2.61 cm AV Area (VTI):     3.30 cm AV Vmax:           122.50 cm/s AV Vmean:  88.100 cm/s AV VTI:            0.230 m AV Peak Grad:      6.0 mmHg AV Mean Grad:      3.5 mmHg LVOT Vmax:         125.00 cm/s LVOT Vmean:        81.100 cm/s LVOT VTI:           0.268 m LVOT/AV VTI ratio: 1.16  AORTA Ao Root diam: 3.20 cm MITRAL VALVE                TRICUSPID VALVE MV Area (PHT): 3.60 cm     TR Peak grad:   33.6 mmHg MV Area VTI:   2.87 cm     TR Vmax:        290.00 cm/s MV Peak grad:  6.0 mmHg MV Mean grad:  2.0 mmHg     SHUNTS MV Vmax:       1.22 m/s     Systemic VTI:  0.27 m MV Vmean:      69.8 cm/s    Systemic Diam: 1.90 cm MV Decel Time: 211 msec MV E velocity: 83.80 cm/s MV A velocity: 121.00 cm/s MV E/A ratio:  0.69 Evalene Lunger MD Electronically signed by Evalene Lunger MD Signature Date/Time: 06/06/2024/2:10:32 PM    Final    US  Venous Img Lower Bilateral Result Date: 06/05/2024 CLINICAL DATA:  elevated d-dimer EXAM: BILATERAL LOWER EXTREMITY VENOUS DOPPLER ULTRASOUND TECHNIQUE: Gray-scale sonography with graded compression, as well as color Doppler and duplex ultrasound were performed to evaluate the lower extremity deep venous systems from the level of the common femoral vein and including the common femoral, femoral, profunda femoral, popliteal and calf veins including the posterior tibial, peroneal and gastrocnemius veins when visible. The superficial great saphenous vein was also interrogated. Spectral Doppler was utilized to evaluate flow at rest and with distal augmentation maneuvers in the common femoral, femoral and popliteal veins. COMPARISON:  January 12, 2022 FINDINGS: RIGHT LOWER EXTREMITY Common Femoral Vein: No evidence of thrombus. Normal compressibility, respiratory phasicity and response to augmentation. Saphenofemoral Junction: No evidence of thrombus. Normal compressibility and flow on color Doppler imaging. Profunda Femoral Vein: No evidence of thrombus. Normal compressibility and flow on color Doppler imaging. Femoral Vein: No evidence of thrombus. Normal compressibility, respiratory phasicity and response to augmentation. Popliteal Vein: No evidence of thrombus. Normal compressibility, respiratory phasicity and response to augmentation.  Calf Veins: No evidence of thrombus. Normal compressibility and flow on color Doppler imaging. Superficial Great Saphenous Vein: No evidence of thrombus. Normal compressibility. Other Findings:  The small saphenous vein is thrombosed. LEFT LOWER EXTREMITY Common Femoral Vein: No evidence of thrombus. Normal compressibility, respiratory phasicity and response to augmentation. Saphenofemoral Junction: No evidence of thrombus. Normal compressibility and flow on color Doppler imaging. Profunda Femoral Vein: No evidence of thrombus. Normal compressibility and flow on color Doppler imaging. Femoral Vein: No evidence of thrombus. Normal compressibility, respiratory phasicity and response to augmentation. Popliteal Vein: No evidence of thrombus. Normal compressibility, respiratory phasicity and response to augmentation. Calf Veins: No evidence of thrombus. Normal compressibility and flow on color Doppler imaging. Superficial Great Saphenous Vein: No evidence of thrombus. Normal compressibility. Other Findings: Likely popliteal cysts bilaterally, measuring up to 3.5 cm on the right. IMPRESSION: 1. Negative for deep venous thrombosis within both legs. 2. Positive for superficial venous thrombosis in the small saphenous vein on the right. Electronically Signed   By: Rogelia Carlean HERO.D.  On: 06/05/2024 18:32   CT Angio Chest PE W and/or Wo Contrast Result Date: 06/05/2024 CLINICAL DATA:  Elevated D-dimer.  Concern for pulmonary embolism EXAM: CT ANGIOGRAPHY CHEST WITH CONTRAST TECHNIQUE: Multidetector CT imaging of the chest was performed using the standard protocol during bolus administration of intravenous contrast. Multiplanar CT image reconstructions and MIPs were obtained to evaluate the vascular anatomy. RADIATION DOSE REDUCTION: This exam was performed according to the departmental dose-optimization program which includes automated exposure control, adjustment of the mA and/or kV according to patient size and/or use  of iterative reconstruction technique. CONTRAST:  75mL OMNIPAQUE  IOHEXOL  350 MG/ML SOLN COMPARISON:  Chest CT 05/05/2021 FINDINGS: Cardiovascular: No filling defects within the pulmonary arteries to suggest acute pulmonary embolism. Mediastinum/Nodes: Marked dilatation the proximal esophagus. The esophagus above the carina measures 5.4 cm in diameter. There is gas filled esophagus and GE junction. The more distal esophagus is normal caliber at 1.2 cm. No fluid esophagus. No food stuff esophagus. Esophageal dilatation is similar tube of progressed from CT 2022. No mediastinal adenopathy.  Trachea normal Lungs/Pleura: There is dense bibasilar airspace disease airspace disease extends into the lingula and RIGHT middle lobe. Six dense of bronchiectasis in the lower lobes. Upper Abdomen: Limited view of the liver, kidneys, pancreas are unremarkable. Normal adrenal glands. Musculoskeletal: Very little body fat. No aggressive osseous lesion compression fracture of the lower thoracic spine appears chronic. No acute fracture Review of the MIP images confirms the above findings. IMPRESSION: 1. No evidence acute pulmonary embolism. 2. Dense bibasilar airspace disease with bronchiectasis. Findings concerning severe bibasilar pneumonia. 3. Marked dilatation of the proximal esophagus. No fluid in the esophagus. No evidence of esophageal obstruction. 4. Very little body fat. Electronically Signed   By: Jackquline Boxer M.D.   On: 06/05/2024 17:01   DG Chest 1 View Result Date: 06/05/2024 CLINICAL DATA:  Dizziness, productive cough, COPD; evaluate for pleural edema EXAM: CHEST  1 VIEW COMPARISON:  November 04, 2023 May 05, 2021 FINDINGS: Biapical pleuroparenchymal scarring. Chronic changes of interstitial lung disease again noted. Increasing patchy airspace opacities in the right mid and left lower lung zones. No pneumothorax. Chronic blunting of the right costophrenic sulcus, unchanged. No cardiomegaly. Aortic atherosclerosis.  No acute fracture or destructive lesions. Multilevel thoracic osteophytosis. Osteopenia. IMPRESSION: Underlying changes of chronic interstitial lung disease again noted. Increasing patchy airspace opacities in the right mid and left lower lung zones, worrisome for superimposed bronchopneumonia. Electronically Signed   By: Rogelia Myers M.D.   On: 06/05/2024 14:24    Assessment/Plan 1. Atrial fibrillation with rapid ventricular response (HCC) (Primary) -rate controlled continues on amiodarone . No ongoing palpitations Follow up scheduled with cardiologist   2. Tremor of both hands Will restart propranolol  10 mg PO BID Monitor for bradycardia, hypotension    3. Chronic vertigo Has been doing better since undergoing therapy.   4. Weight loss Ongoing weight loss Continues on supplement and support from staff. She is trying to increase caloric intake.    Abrey Bradway K. Caro BODILY [facility] [phone]

## 2024-07-02 ENCOUNTER — Telehealth: Payer: Self-pay | Admitting: Cardiology

## 2024-07-02 NOTE — Telephone Encounter (Signed)
 Returned call to pt's daughter regardng her appointments for 10/14 Dene) and 10/15 Marny).  Daughter is unable to attend 10/14 appointment and would like to reschedule.  LM for her to return call.  [place] has an opening at 1:55

## 2024-07-02 NOTE — Telephone Encounter (Signed)
 Pts daughter calling to ask if her appointments on 10/14 and 10/15 can be consolidated. She has a scheduling conflict 10/14 and will not be able to bring her daughter. She would like to know if med management can be discussed at 10/15 appt. Please advise.

## 2024-07-02 NOTE — Telephone Encounter (Signed)
 Spoke with patient's daughter and she stated that her mother has decided to cancel appointment with Cadence Franchester, PA-C and just follow up with Dr. Cindie since he is managing pt's Amiodarone .  Feels that she doesn't need to see both at this time.  F/U from hospitalization for pneumonia and Afib.  10/14 Appointment cancelled.  Pt's daughter thanked me for returning her call and helping them with appointment.

## 2024-07-04 ENCOUNTER — Other Ambulatory Visit (HOSPITAL_COMMUNITY): Payer: Self-pay

## 2024-07-04 ENCOUNTER — Telehealth: Payer: Self-pay

## 2024-07-04 NOTE — Telephone Encounter (Signed)
 Prolia VOB initiated via [email or web address]  Next Prolia inj DUE: 10/15/23

## 2024-07-04 NOTE — Telephone Encounter (Signed)
 Pt ready for scheduling for PROLIA  on or after : 08/04/24  Option# 1: Buy/Bill (Office supplied medication)  Out-of-pocket cost due at time of clinic visit: $0  Number of injection/visits approved: ---  Primary: MEDICARE Prolia  co-insurance: 0% Admin fee co-insurance: 0%  Secondary: TRICARE FOR LIFE Prolia  co-insurance:  Admin fee co-insurance:   Medical Benefit Details: Date Benefits were checked: 07/04/24 Deductible: $257 Met of $257 Required/ Coinsurance: 0%/ Admin Fee: 0%  Prior Auth: N/A PA# Expiration Date:   # of doses approved: ----------------------------------------------------------------------- Option# 2- Med Obtained from pharmacy:  Pharmacy benefit: Copay $--- (Paid to pharmacy) Admin Fee: --- (Pay at clinic)  Prior Auth: --- PA# Expiration Date:   # of doses approved:   If patient wants fill through the pharmacy benefit please send prescription to: ---, and include estimated need by date in rx notes. Pharmacy will ship medication directly to the office.  Patient NOT eligible for Prolia  Copay Card. Copay Card can make patient's cost as little as $25. Link to apply: [email or web address]://www.amgensupportplus.com/copay  ** This summary of benefits is an estimation of the patient's out-of-pocket cost. Exact cost may very based on individual plan coverage.

## 2024-07-04 NOTE — Telephone Encounter (Signed)
 SABRA

## 2024-07-06 ENCOUNTER — Inpatient Hospital Stay

## 2024-07-10 NOTE — Progress Notes (Signed)
    Assessment & Plan:  There are no diagnoses linked to this encounter.  Patient Instructions  1.  Take your Protonix  2 tablets in the morning (total 40 mg)  2.  Take Pepcid  1 tablet (20 mg) at bedtime  3.  Continue following antireflux measures as you are doing  4.  Continue Zyrtec for now  5.  We will give you a trial of Spiriva  2 inhalations daily to see if this helps your cough.  6.  We will see you in follow-up in 4 weeks time call sooner should any new difficulties arise or symptoms worsen in the interim.    Please note: late entry documentation due to logistical difficulties during COVID-19 pandemic. This note is filed for information purposes only, and is not intended to be used for billing, nor does it represent the full scope/nature of the visit in question. Please see any associated scanned media linked to date of encounter for additional pertinent information.  Subjective:    HPI: Crystal Gregory is a 89 y.o. female presenting to the pulmonology clinic on 07/04/2019 with report of: Follow-up (Former DS pt- cough has worsen since last OV. non prod cough and nasal drainage clear in color)     Outpatient Encounter Medications as of 07/04/2019  Medication Sig   denosumab  (PROLIA ) 60 MG/ML SOLN injection Inject 60 mg into the skin every 6 (six) months. Administer in upper arm, thigh, or abdomen (Patient not taking: Reported on 06/27/2024)   [DISCONTINUED] azaTHIOprine  (IMURAN ) 50 MG tablet Take 75 mg every morning and 50 mg every evening.   [DISCONTINUED] Dextromethorphan-Guaifenesin  60-1200 MG 12hr tablet Take 1 tablet by mouth Nightly.   [DISCONTINUED] famotidine  (PEPCID ) 20 MG tablet Take 1 tablet (20 mg total) by mouth daily. Each morning   [DISCONTINUED] pantoprazole  (PROTONIX ) 20 MG tablet Take 1 tablet (20 mg total) by mouth 2 (two) times daily. (Patient taking differently: Take 40 mg by mouth daily before breakfast. )   [DISCONTINUED] predniSONE  (DELTASONE ) 1 MG  tablet Take 2 mg by mouth daily with breakfast.   [DISCONTINUED] propranolol  (INDERAL ) 10 MG tablet TAKE 1 TABLET TWICE A DAY for tremor.   [DISCONTINUED] traZODone  (DESYREL ) 50 MG tablet TAKE 1 TABLET AT BEDTIME for sleep.   [DISCONTINUED] fluticasone  (FLONASE ) 50 MCG/ACT nasal spray Place 1 spray into both nostrils daily. (Patient not taking: Reported on 07/04/2019)   [DISCONTINUED] fluticasone -salmeterol (ADVAIR  HFA) 115-21 MCG/ACT inhaler Inhale 2 puffs into the lungs 2 (two) times daily. (Patient not taking: Reported on 07/04/2019)   [DISCONTINUED] nitroGLYCERIN  (NITROSTAT ) 0.4 MG SL tablet Place 1 tablet (0.4 mg total) under the tongue every 5 (five) minutes as needed for chest pain. Maximum 3 doses.   [DISCONTINUED] Olopatadine  HCl 0.6 % SOLN Place 2 sprays into the nose 2 (two) times daily.   [DISCONTINUED] Tiotropium Bromide Monohydrate  (SPIRIVA  RESPIMAT) 2.5 MCG/ACT AERS Inhale 2 puffs into the lungs daily.   No facility-administered encounter medications on file as of 07/04/2019.      Objective:   Vitals:   07/04/19 1049  BP: 124/70  Pulse: 78  Temp: 97.8 F (36.6 C)  Height: 5' 8 (1.727 m)  Weight: 124 lb 12.8 oz (56.6 kg)  SpO2: 100%  TempSrc: Temporal  BMI (Calculated): 18.98     Physical exam documentation is limited by delayed entry of information.

## 2024-07-12 ENCOUNTER — Non-Acute Institutional Stay (SKILLED_NURSING_FACILITY): Payer: Self-pay | Admitting: Adult Health

## 2024-07-12 ENCOUNTER — Encounter: Payer: Self-pay | Admitting: Adult Health

## 2024-07-12 DIAGNOSIS — I4891 Unspecified atrial fibrillation: Secondary | ICD-10-CM | POA: Diagnosis not present

## 2024-07-12 DIAGNOSIS — R110 Nausea: Secondary | ICD-10-CM | POA: Diagnosis not present

## 2024-07-12 DIAGNOSIS — M069 Rheumatoid arthritis, unspecified: Secondary | ICD-10-CM

## 2024-07-12 DIAGNOSIS — K219 Gastro-esophageal reflux disease without esophagitis: Secondary | ICD-10-CM

## 2024-07-12 DIAGNOSIS — F419 Anxiety disorder, unspecified: Secondary | ICD-10-CM | POA: Diagnosis not present

## 2024-07-12 NOTE — Progress Notes (Signed)
 Location:  Other [facility]) Nursing Home Room Number: [place] [facility]) Place of Service:  SNF ([phone]) Provider:  Medina-Vargas, Emmali Karow, DNP, FNP-BC  Patient Care Team: [name], NP as PCP - General (Internal Medicine) Perla Evalene PARAS, MD as PCP - Cardiology (Cardiology) Cindie Ole DASEN, MD as PCP - Electrophysiology (Cardiology) Perla Evalene PARAS, MD as Consulting Physician (Cardiology) Dolphus Reiter, MD as Consulting Physician (Rheumatology) Tamea Dedra CROME, MD as Consulting Physician (Pulmonary Disease)  Extended Emergency Contact Information Primary Emergency Contact: [facility] Address: [address]          [place], [place] [address] [place] Home Phone: [phone] Mobile Phone: [phone] Relation: Daughter  Code Status:  DNR  Goals of care: Advanced Directive information    07/12/2024    9:35 AM  Advanced Directives  Does Patient Have a Medical Advance Directive? Yes  Type of Advance Directive Out of facility DNR (pink MOST or yellow form)  Does patient want to make changes to medical advance directive? No - Patient declined  Would patient like information on creating a medical advance directive? No - Patient declined     Chief Complaint  Patient presents with   Medical Management of Chronic Issues    Routine Visit, shingles, flu and covid vaccine    HPI:  Pt is a 89 y.o. female seen today for medical management of chronic diseases. She is a resident of [facility]. She was seen in the room with daughter at bedside. Patient stated that she gets full fast when she eats. She feels too tired to eat per patient. She denies abdominal pain. She has chronic nausea and takes Reglan  5 mg 3 times a day.  She has atrial fibrillation for which she takes Pacerone  200 mg daily and Eliquis  2.5 mg twice a day.  She denies palpitations.  She has rheumatoid arthritis for which she takes azathioprine   50 mg twice a day.,  Prednisone  20 mg daily on MWF and Sunday and prednisone  60 mg daily on Tuesday, Thursdays, Saturdays.  She has acid reflux and takes TUMS Ex 1 tab in the afternoon and Nexium  20 mg twice a day.  She takes sertraline 100 mg daily for and trazodone  50 mg at bedtime for anxiety.   Past Medical History:  Diagnosis Date   Allergy    Anal fissure    Aspiration into airway    CAP (community acquired pneumonia) 08/23/2017   Chest pain    a. 09/04/2018 MV: EF >65%. No ischemia/infarct.   Coronary artery calcification seen on CT scan    Diastolic dysfunction    a. 10/2016 Echo: EF 60-65%, no rwma, Gr1 DD, mild MR, nl RV fxn, mild to mod TR, PASP ; b. 01/2022 Echo: EF 60-65%, GrI DD, RVVSP 37.16mmHg; c. 06/2024 Echo: EF 60-65%, no rwma, GrI DD, nl RV fxn, mild-mod MR, AoV sclerosis.   Diverticulosis    Dyspnea    Edema of right lower extremity 01/29/2022   Fever 06/15/2023   GERD (gastroesophageal reflux disease)    Hemorrhoids    Hyperplastic colon polyp    Interstitial lung disorders (HCC)    Persistent atrial fibrillation (HCC)    a. Dx 06/2024. CHA2DS2VASc = 3-->eliquis . Converted on Amiodarone  - plan for 1-2 mos rx.   Pulmonary fibrosis (HCC)    Septicemia (HCC) 1999   spent 4 weeks in ICU, intubated -? PNA   Vertebral fracture, pathological    Past Surgical History:  Procedure Laterality Date   ABDOMINAL HYSTERECTOMY     FLEXIBLE BRONCHOSCOPY N/A 07/05/2016   Procedure: FLEXIBLE BRONCHOSCOPY;  Surgeon: Jorie Cha, MD;  Location: [facility] ORS;  Service: Cardiopulmonary;  Laterality: N/A;   IR RADIOLOGIST EVAL & MGMT  11/05/2021    No Known Allergies  Outpatient Encounter Medications as of 07/12/2024  Medication Sig   acetaminophen  (TYLENOL ) 500 MG tablet Take 1,000 mg by mouth every 8 (eight) hours as needed.   amiodarone  (PACERONE ) 200 MG tablet Take 1 tablet (200 mg total) by mouth daily.   apixaban  (ELIQUIS ) 2.5 MG TABS tablet Take 1 tablet (2.5 mg total)  by mouth 2 (two) times daily.   azaTHIOprine  (IMURAN ) 50 MG tablet Take 50 mg by mouth 2 (two) times daily.   bisacodyl  (DULCOLAX) 5 MG EC tablet Take 2 tablets (10 mg total) by mouth at bedtime.   Calcium Carb-Cholecalciferol (CALCIUM 600 + D PO) Take 1 tablet by mouth daily.   cyanocobalamin  (VITAMIN B12) 500 MCG tablet Take 1 tablet (500 mcg total) by mouth daily.   esomeprazole  (NEXIUM ) 20 MG capsule Take 1 capsule (20 mg total) by mouth 2 (two) times daily before a meal.   gabapentin  (NEURONTIN ) 100 MG capsule Take 100 mg by mouth at bedtime.   ipratropium-albuterol  (DUONEB) 0.5-2.5 (3) MG/3ML SOLN Take 3 mLs by nebulization every 6 (six) hours as needed.   metoCLOPramide  (REGLAN ) 5 MG tablet Take 1 tablet (5 mg total) by mouth every 8 (eight) hours as needed for nausea or vomiting (and Vertigo).   montelukast  (SINGULAIR ) 10 MG tablet Take 1 tablet (10 mg total) by mouth daily.   oxyCODONE  (OXY IR/ROXICODONE ) 5 MG immediate release tablet Take 5 mg by mouth every 12 (twelve) hours as needed for severe pain (pain score 7-10).   polyethylene glycol (MIRALAX  / GLYCOLAX ) 17 g packet Take 17 g by mouth 2 (two) times daily.   predniSONE  (DELTASONE ) 1 MG tablet Take 3 mg by mouth daily with breakfast. Every Monday, Wednesday, Friday and Sunday. Give 6mg  by mouth in the morning every Tuesday, Thursday and Saturday.   propranolol  (INDERAL ) 10 MG tablet Take 10 mg by mouth 2 (two) times daily.   sertraline (ZOLOFT) 100 MG tablet Take 100 mg by mouth daily.   traZODone  (DESYREL ) 50 MG tablet Take 50 mg by mouth at bedtime.   amiodarone  (PACERONE ) 200 MG tablet Take 1 tablet (200 mg total) by mouth 2 (two) times daily for 6 days. (Patient not taking: Reported on 07/12/2024)   azaTHIOprine  (IMURAN ) 50 MG tablet TAKE ONE AND ONE-HALF TABLETS (75 MG) EVERY MORNING AND 1 TABLET (50 MG) EVERY EVENING (Patient not taking: Reported on 07/12/2024)   denosumab  (PROLIA ) 60 MG/ML SOLN injection Inject 60 mg into the  skin every 6 (six) months. Administer in upper arm, thigh, or abdomen (Patient not taking: Reported on 07/12/2024)   diphenhydrAMINE  (BENADRYL ) 12.5 MG/5ML elixir Take 5 mLs (12.5 mg total) by mouth every 8 (eight) hours as needed (Vertigo). (Patient not taking: Reported on 07/12/2024)   gabapentin  (NEURONTIN ) 100 MG capsule Take 2 capsules (200 mg total) by mouth at bedtime. (Patient not taking: Reported on 07/12/2024)   loratadine (CLARITIN) 10 MG tablet Take 10 mg by mouth daily. (Patient not taking: Reported on 07/12/2024)   LORazepam  (ATIVAN ) 0.5 MG tablet Take 1 tablet (0.5 mg total) by mouth every 8 (eight) hours as needed (Vertigo). (Patient not taking: Reported on 07/12/2024)   ondansetron  (ZOFRAN ) 4 MG tablet Take 1 tablet (4 mg  total) by mouth daily as needed for nausea or vomiting. (Patient not taking: Reported on 07/12/2024)   oxyCODONE  (OXY IR/ROXICODONE ) 5 MG immediate release tablet Take 1 tablet (5 mg total) by mouth every 6 (six) hours as needed for moderate pain (pain score 4-6) or severe pain (pain score 7-10). (Patient not taking: Reported on 07/12/2024)   sertraline (ZOLOFT) 25 MG tablet Take 25 mg by mouth daily. (Patient not taking: Reported on 07/12/2024)   sertraline (ZOLOFT) 50 MG tablet Take 50 mg by mouth daily. (Patient not taking: Reported on 07/12/2024)   SERTRALINE HCL PO Take 75 mg by mouth daily. (Patient not taking: Reported on 07/12/2024)   Facility-Administered Encounter Medications as of 07/12/2024  Medication   denosumab  (PROLIA ) injection 60 mg   [START ON 08/04/2024] denosumab  (PROLIA ) injection 60 mg    Review of Systems  Constitutional:  Positive for fatigue. Negative for appetite change, chills and fever.  HENT:  Negative for congestion, hearing loss, rhinorrhea and sore throat.   Eyes: Negative.   Respiratory:  Negative for cough, shortness of breath and wheezing.   Cardiovascular:  Negative for chest pain, palpitations and leg swelling.  Gastrointestinal:   Positive for nausea. Negative for abdominal pain, constipation, diarrhea and vomiting.  Genitourinary:  Negative for dysuria.  Musculoskeletal:  Negative for arthralgias, back pain and myalgias.  Skin:  Negative for color change, rash and wound.  Neurological:  Negative for dizziness, weakness and headaches.  Psychiatric/Behavioral:  Negative for behavioral problems. The patient is not nervous/anxious.     Immunization History  Administered Date(s) Administered   Fluad Quad(high Dose 65+) 07/24/2019, 07/04/2022, 07/28/2023   INFLUENZA, HIGH DOSE SEASONAL PF 09/10/2016   Influenza Split 06/04/2012   Influenza,inj,Quad PF,6+ Mos 07/04/2013, 08/18/2015, 07/13/2017, 06/19/2018   Influenza-Unspecified 07/15/2020   Moderna SARS-COV2 Booster Vaccination 05/19/2020, 02/19/2021   Moderna Sars-Covid-2 Vaccination 10/22/2019, 11/19/2019, 04/02/2022   Pneumococcal Conjugate-13 10/06/2011   Pneumococcal Polysaccharide-23 06/19/2018   Zoster, Live 01/14/2007   Pertinent  Health Maintenance Due  Topic Date Due   Influenza Vaccine  05/04/2024   DEXA SCAN  Completed      04/26/2022    8:24 AM 12/22/2022    9:42 AM 04/28/2023    9:28 AM 12/20/2023   10:47 AM 01/25/2024    3:27 PM  Fall Risk  Falls in the past year? 0 0 0 0 0  Was there an injury with Fall? 0 0 0 0 0  Fall Risk Category Calculator 0 0 0 0 0  Fall Risk Category (Retired) Low       Patient at Risk for Falls Due to No Fall Risks No Fall Risks No Fall Risks No Fall Risks No Fall Risks  Fall risk Follow up Falls evaluation completed  Falls evaluation completed Falls prevention discussed;Falls evaluation completed Falls evaluation completed Falls evaluation completed     Data saved with a previous flowsheet row definition     Vitals:   07/12/24 0937  BP: (!) 121/56  Pulse: 74  Resp: (!) 22  Temp: 97.6 F (36.4 C)  SpO2: 92%  Weight: 93 lb 6.4 oz (42.4 kg)  Height: 5' 8 (1.727 m)   Body mass index is 14.2 kg/m.  Physical  Exam Constitutional:      General: She is not in acute distress. HENT:     Head: Normocephalic and atraumatic.     Nose: Nose normal.     Mouth/Throat:     Mouth: Mucous membranes are moist.  Eyes:  Conjunctiva/sclera: Conjunctivae normal.  Cardiovascular:     Rate and Rhythm: Normal rate and regular rhythm.  Pulmonary:     Effort: Pulmonary effort is normal.     Breath sounds: Normal breath sounds.  Abdominal:     General: Bowel sounds are normal.     Palpations: Abdomen is soft.  Musculoskeletal:        General: Normal range of motion.     Cervical back: Normal range of motion.  Skin:    General: Skin is warm and dry.  Neurological:     General: No focal deficit present.     Mental Status: She is alert and oriented to person, place, and time.  Psychiatric:        Mood and Affect: Mood normal.        Behavior: Behavior normal.      Labs reviewed: Recent Labs    06/07/24 1239 06/08/24 0635 06/12/24 0605 06/14/24 0000  NA 129* 133* 135 137  K 4.1 3.7 3.9 4.2  CL 94* 93* 96* 97*  CO2 25 30 31  31*  GLUCOSE 119* 127* 95  --   BUN 10 12 17 17   CREATININE 0.71 0.47 0.58 0.5  CALCIUM 7.8* 7.5* 8.3* 8.4*  MG 2.0 1.8 2.0  --   PHOS 2.4* 3.4 2.8  --    Recent Labs    06/05/24 1258  AST 23  ALT 17  ALKPHOS 110  BILITOT 0.9  PROT 6.4*  ALBUMIN 2.4*   Recent Labs    06/05/24 1258 06/06/24 0517 06/07/24 1239 06/08/24 0635 06/12/24 0605 06/14/24 0000 06/21/24 0000  WBC 10.6*   < > 12.7* 11.4* 8.7 6.4 7.6  NEUTROABS 9.4*  --   --   --   --  4,250.00 6,050.00  HGB 9.5*   < > 9.8* 8.0* 8.3* 9.3* 9.9*  HCT 28.1*   < > 29.1* 23.4* 25.9* 29* 31*  MCV 102.2*   < > 102.1* 101.7* 104.4*  --   --   PLT 356   < > 465* 342 343 440* 343   < > = values in this interval not displayed.   Lab Results  Component Value Date   TSH 1.806 05/24/2024   No results found for: HGBA1C No results found for: CHOL, HDL, LDLCALC, LDLDIRECT, TRIG,  CHOLHDL  Significant Diagnostic Results in last 30 days:  No results found.  Assessment/Plan  1. Chronic nausea (Primary) -  feels full fast when eating meals -  gets nauseous frequently -Continue Reglan  5 mg 3 times a day -   Check for stool H. pylori   2. Atrial fibrillation with rapid ventricular response (HCC) -  Rate controlled   -   Continue Eliquis  2.5 mg twice a day for anticoagulation -    Continue Pacerone  200 mg daily for rate control  3. Rheumatoid arthritis, involving unspecified site, unspecified whether rheumatoid factor present (HCC) -   Continue prednisone  3 mg on MWF and Sunday -  Continue prednisone  60 mg daily daily on Tuesdays, Thursdays and Saturdays  4. Gastroesophageal reflux disease without esophagitis -Denies acid reflux but gets early satiety when eating meals -   continue Nexium  20 mg BID -  continue TUMS EX I tab in the afternoon  5. Anxiety -Continue sertraline 100 mg daily and trazodone  50 mg at bedtime    Family/ staff Communication: Discussed plan of care with resident and charge nurse.  Labs/tests ordered:  stool H. Pylori test    Wyat Infinger  Medina-Vargas, DNP, MSN, FNP-BC [facility] [phone] (Monday-Friday 8:00 a.m. - 5:00 p.m.) [phone] (after hours)

## 2024-07-13 LAB — CBC AND DIFFERENTIAL
HCT: 29 — AB (ref 36–46)
Hemoglobin: 9.6 — AB (ref 12.0–16.0)
Neutrophils Absolute: 6975
Platelets: 367 K/uL (ref 150–400)
WBC: 9

## 2024-07-13 LAB — COMPREHENSIVE METABOLIC PANEL WITH GFR
Calcium: 8.6 — AB (ref 8.7–10.7)
eGFR: 92

## 2024-07-13 LAB — BASIC METABOLIC PANEL WITH GFR
BUN: 15 (ref 4–21)
CO2: 34 — AB (ref 13–22)
Chloride: 93 — AB (ref 99–108)
Creatinine: 0.4 — AB (ref 0.5–1.1)
Glucose: 102
Potassium: 3.5 meq/L (ref 3.5–5.1)
Sodium: 135 — AB (ref 137–147)

## 2024-07-13 LAB — CBC: RBC: 2.82 — AB (ref 3.87–5.11)

## 2024-07-14 ENCOUNTER — Encounter: Payer: Self-pay | Admitting: Internal Medicine

## 2024-07-14 NOTE — Progress Notes (Signed)
 [facility] called with Xray results last night  It showed Right sided Multifocal Pneumonia with Small effusion Patient is on 2 Lt of Oxygen  Vitals stable Started on Doxycyline 100 mg BID for 10 days Send to ED if patient gets worse with her Hypoxia Reval on Mon

## 2024-07-17 ENCOUNTER — Ambulatory Visit: Admitting: Medical

## 2024-07-18 ENCOUNTER — Institutional Professional Consult (permissible substitution): Admitting: Cardiology

## 2024-07-19 ENCOUNTER — Non-Acute Institutional Stay (SKILLED_NURSING_FACILITY): Payer: Self-pay | Admitting: Internal Medicine

## 2024-07-19 ENCOUNTER — Encounter: Payer: Self-pay | Admitting: Oncology

## 2024-07-19 ENCOUNTER — Encounter: Payer: Self-pay | Admitting: Internal Medicine

## 2024-07-19 DIAGNOSIS — R239 Unspecified skin changes: Secondary | ICD-10-CM

## 2024-07-19 DIAGNOSIS — J849 Interstitial pulmonary disease, unspecified: Secondary | ICD-10-CM

## 2024-07-19 DIAGNOSIS — K225 Diverticulum of esophagus, acquired: Secondary | ICD-10-CM

## 2024-07-19 DIAGNOSIS — R636 Underweight: Secondary | ICD-10-CM

## 2024-07-19 DIAGNOSIS — Z681 Body mass index (BMI) 19 or less, adult: Secondary | ICD-10-CM | POA: Diagnosis not present

## 2024-07-19 DIAGNOSIS — L89152 Pressure ulcer of sacral region, stage 2: Secondary | ICD-10-CM | POA: Insufficient documentation

## 2024-07-19 DIAGNOSIS — J9601 Acute respiratory failure with hypoxia: Secondary | ICD-10-CM | POA: Diagnosis not present

## 2024-07-19 DIAGNOSIS — J189 Pneumonia, unspecified organism: Secondary | ICD-10-CM

## 2024-07-19 DIAGNOSIS — J9611 Chronic respiratory failure with hypoxia: Secondary | ICD-10-CM | POA: Insufficient documentation

## 2024-07-19 NOTE — Assessment & Plan Note (Addendum)
 Pt remains on 2 L/min O2 that was started recently. Dtr states she does get short winded quickly. She is comfortable at rest today. Will continue with supplemental O2 and continue with aggressive pulmonary toileting.

## 2024-07-19 NOTE — Progress Notes (Addendum)
 Location:  Other [place].  Nursing Home Room Number: [facility] SN[facility] 118A Place of Service:  [phone]) Dr. Camellia Door  PCP: [name], NP  Patient Care Team: [name], NP as PCP - General (Internal Medicine) Perla Evalene PARAS, MD as PCP - Cardiology (Cardiology) Perla Evalene PARAS, MD as Consulting Physician (Cardiology) Dolphus Reiter, MD as Consulting Physician (Rheumatology) Tamea Dedra CROME, MD as Consulting Physician (Pulmonary Disease) Riddle, Suzann, NP as Nurse Practitioner (Clinical Cardiac Electrophysiology)  Extended Emergency Contact Information Primary Emergency Contact: [facility] Address: [address]          [place], [place] [address] [place] Home Phone: [phone] Mobile Phone: [phone] Relation: Daughter  Goals of care: Advanced Directive information    07/12/2024    9:35 AM  Advanced Directives  Does Patient Have a Medical Advance Directive? Yes  Type of Advance Directive Out of facility DNR (pink MOST or yellow form)  Does patient want to make changes to medical advance directive? No - Patient declined  Would patient like information on creating a medical advance directive? No - Patient declined     Chief Complaint  Patient presents with   Pneumonia    Pneumonia    HPI:  Pt is a 89 y.o. female seen today for an acute visit for Pneumonia. Pt had had been admitted to the hospital from June 05, 2024 through June 12, 2024.  She was discharged to [facility].  She has a long history of bronchiectasis and interstitial lung disease followed by pulmonology.  She has a very large esophageal diverticulum that appears to nearly encompass the right apical cavity of her right hemithorax.  Daughter Rolan is at the bedside.  Due to a chest x-ray that showed persistent multifocal pneumonia, and some hypoxia at 2 L, she was started on doxycycline  100 mg twice daily starting 07/14/2024.  She has  been afebrile.  She has some nausea.  She has been having loose stools.  Overall the daughter thinks that she is feeling worse.  In discussing her swallowing history, the patient only eats very small meals 5-6 times a day.  She frequently feels very full.  She never regurgitates any food but feels like food is stuck between her mid sternum and her top of her stomach.  She preferentially lays on her left side and said the right side she finds that she has more difficulty with breathing when she is laying on her right side.  She has never had this esophageal diverticulum evaluated by ENT.  She also developed a small stage I-II sacral wound that is being treated by the SNF wound care team.  Her daughter was anxious to speak with me today and discussed the patient's overall progress.  Past Medical History:  Diagnosis Date   Allergy    Anal fissure    Aspiration into airway    CAP (community acquired pneumonia) 08/23/2017   Chest pain    a. 09/04/2018 MV: EF >65%. No ischemia/infarct.   Coronary artery calcification seen on CT scan    Diastolic dysfunction    a. 10/2016 Echo: EF 60-65%, no rwma, Gr1 DD, mild MR, nl RV fxn, mild to mod TR, PASP ; b. 01/2022 Echo: EF 60-65%, GrI DD, RVVSP 37.49mmHg; c. 06/2024 Echo: EF 60-65%, no rwma, GrI DD, nl RV fxn, mild-mod MR, AoV sclerosis.   Diverticulosis    Dyspnea    Edema of right lower extremity 01/29/2022  Fever 06/15/2023   GERD (gastroesophageal reflux disease)    Hemorrhoids    Hyperplastic colon polyp    Interstitial lung disorders (HCC)    Persistent atrial fibrillation (HCC)    a. Dx 06/2024. CHA2DS2VASc = 3-->eliquis . Converted on Amiodarone  - plan for 1-2 mos rx.   Pulmonary fibrosis (HCC)    Septicemia (HCC) 1999   spent 4 weeks in ICU, intubated -? PNA   Vertebral fracture, pathological    Past Surgical History:  Procedure Laterality Date   ABDOMINAL HYSTERECTOMY     FLEXIBLE BRONCHOSCOPY N/A 07/05/2016   Procedure: FLEXIBLE  BRONCHOSCOPY;  Surgeon: Jorie Cha, MD;  Location: [facility] ORS;  Service: Cardiopulmonary;  Laterality: N/A;   IR RADIOLOGIST EVAL & MGMT  11/05/2021    No Known Allergies  Outpatient Encounter Medications as of 07/19/2024  Medication Sig   acetaminophen  (TYLENOL ) 500 MG tablet Take 1,000 mg by mouth every 8 (eight) hours as needed.   azaTHIOprine  (IMURAN ) 50 MG tablet Take 50 mg by mouth 2 (two) times daily. (Patient not taking: Reported on 07/23/2024)   bisacodyl  (DULCOLAX) 5 MG EC tablet Take 2 tablets (10 mg total) by mouth at bedtime.   calcium carbonate (TUMS EX) 750 MG chewable tablet Chew 1 tablet by mouth daily.   cyanocobalamin  (VITAMIN B12) 500 MCG tablet Take 1 tablet (500 mcg total) by mouth daily.   doxycycline  (DORYX ) 100 MG EC tablet Take 100 mg by mouth 2 (two) times daily.   esomeprazole  (NEXIUM ) 20 MG capsule Take 1 capsule (20 mg total) by mouth 2 (two) times daily before a meal.   gabapentin  (NEURONTIN ) 100 MG capsule Take 100 mg by mouth at bedtime.   ipratropium-albuterol  (DUONEB) 0.5-2.5 (3) MG/3ML SOLN Take 3 mLs by nebulization every 6 (six) hours as needed.   metoCLOPramide  (REGLAN ) 5 MG tablet Take 1 tablet (5 mg total) by mouth every 8 (eight) hours as needed for nausea or vomiting (and Vertigo).   montelukast  (SINGULAIR ) 10 MG tablet Take 1 tablet (10 mg total) by mouth daily. (Patient not taking: Reported on 07/23/2024)   nystatin (MYCOSTATIN) 100000 UNIT/ML suspension Take 5 mLs by mouth 4 (four) times daily.   OXYGEN  Inhale into the lungs. 2lpm prn   polyethylene glycol (MIRALAX  / GLYCOLAX ) 17 g packet Take 17 g by mouth 2 (two) times daily.   predniSONE  (DELTASONE ) 1 MG tablet Take 3 mg by mouth 4 (four) times a week. 3 mg Every Monday, Wednesday, Friday and Sunday with breakfast   propranolol  (INDERAL ) 10 MG tablet Take 10 mg by mouth 2 (two) times daily.   sertraline (ZOLOFT) 100 MG tablet Take 100 mg by mouth daily.   traZODone  (DESYREL ) 50 MG tablet Take  50 mg by mouth at bedtime.   [DISCONTINUED] amiodarone  (PACERONE ) 200 MG tablet Take 1 tablet (200 mg total) by mouth daily.   [DISCONTINUED] apixaban  (ELIQUIS ) 2.5 MG TABS tablet Take 1 tablet (2.5 mg total) by mouth 2 (two) times daily.   [DISCONTINUED] oxyCODONE  (OXY IR/ROXICODONE ) 5 MG immediate release tablet Take 5 mg by mouth every 12 (twelve) hours as needed for severe pain (pain score 7-10).   azaTHIOprine  (IMURAN ) 50 MG tablet TAKE ONE AND ONE-HALF TABLETS (75 MG) EVERY MORNING AND 1 TABLET (50 MG) EVERY EVENING   Calcium Carb-Cholecalciferol (CALCIUM 600 + D PO) Take 1 tablet by mouth daily. (Patient not taking: Reported on 07/23/2024)   denosumab  (PROLIA ) 60 MG/ML SOLN injection Inject 60 mg into the skin every 6 (six) months. Administer  in upper arm, thigh, or abdomen   diphenhydrAMINE  (BENADRYL ) 12.5 MG/5ML elixir Take 5 mLs (12.5 mg total) by mouth every 8 (eight) hours as needed (Vertigo). (Patient not taking: Reported on 07/23/2024)   gabapentin  (NEURONTIN ) 100 MG capsule Take 2 capsules (200 mg total) by mouth at bedtime. (Patient not taking: Reported on 07/23/2024)   loratadine (CLARITIN) 10 MG tablet Take 10 mg by mouth daily. (Patient not taking: Reported on 07/23/2024)   LORazepam  (ATIVAN ) 0.5 MG tablet Take 1 tablet (0.5 mg total) by mouth every 8 (eight) hours as needed (Vertigo). (Patient not taking: Reported on 07/23/2024)   ondansetron  (ZOFRAN ) 4 MG tablet Take 1 tablet (4 mg total) by mouth daily as needed for nausea or vomiting. (Patient not taking: Reported on 07/23/2024)   sertraline (ZOLOFT) 25 MG tablet Take 25 mg by mouth daily. (Patient not taking: Reported on 07/23/2024)   sertraline (ZOLOFT) 50 MG tablet Take 50 mg by mouth daily. (Patient not taking: Reported on 07/23/2024)   SERTRALINE HCL PO Take 75 mg by mouth daily. (Patient not taking: Reported on 07/23/2024)   [DISCONTINUED] amiodarone  (PACERONE ) 200 MG tablet Take 1 tablet (200 mg total) by mouth 2 (two)  times daily for 6 days. (Patient not taking: Reported on 07/23/2024)   [DISCONTINUED] oxyCODONE  (OXY IR/ROXICODONE ) 5 MG immediate release tablet Take 1 tablet (5 mg total) by mouth every 6 (six) hours as needed for moderate pain (pain score 4-6) or severe pain (pain score 7-10). (Patient not taking: Reported on 07/19/2024)   Facility-Administered Encounter Medications as of 07/19/2024  Medication   denosumab  (PROLIA ) injection 60 mg   [START ON 08/04/2024] denosumab  (PROLIA ) injection 60 mg    Review of Systems  Constitutional:  Positive for fatigue.  HENT: Negative.    Respiratory:  Positive for cough and shortness of breath. Negative for choking.   Cardiovascular: Negative.   Gastrointestinal: Negative.   Endocrine:       Only eats small meals of 5-6 meals per day  Genitourinary: Negative.   Musculoskeletal:        Chronic joint pain  Neurological:  Positive for dizziness.       Chronic vertigo  Hematological: Negative.   Psychiatric/Behavioral: Negative.    All other systems reviewed and are negative.   Immunization History  Administered Date(s) Administered   Fluad Quad(high Dose 65+) 07/24/2019, 07/04/2022, 07/28/2023   INFLUENZA, HIGH DOSE SEASONAL PF 09/10/2016   Influenza Split 06/04/2012   Influenza,inj,Quad PF,6+ Mos 07/04/2013, 08/18/2015, 07/13/2017, 06/19/2018   Influenza-Unspecified 07/15/2020   Moderna SARS-COV2 Booster Vaccination 05/19/2020, 02/19/2021   Moderna Sars-Covid-2 Vaccination 10/22/2019, 11/19/2019, 04/02/2022   Pneumococcal Conjugate-13 10/06/2011   Pneumococcal Polysaccharide-23 06/19/2018   Zoster, Live 01/14/2007   Pertinent  Health Maintenance Due  Topic Date Due   Influenza Vaccine  05/04/2024   DEXA SCAN  Completed      04/26/2022    8:24 AM 12/22/2022    9:42 AM 04/28/2023    9:28 AM 12/20/2023   10:47 AM 01/25/2024    3:27 PM  Fall Risk  Falls in the past year? 0 0 0 0 0  Was there an injury with Fall? 0 0 0 0 0  Fall Risk  Category Calculator 0 0 0 0 0  Fall Risk Category (Retired) Low       Patient at Risk for Falls Due to No Fall Risks No Fall Risks No Fall Risks No Fall Risks No Fall Risks  Fall risk Follow up Falls evaluation completed  Falls evaluation completed Falls prevention discussed;Falls evaluation completed Falls evaluation completed Falls evaluation completed     Data saved with a previous flowsheet row definition   Functional Status Survey:    Vitals:   07/19/24 0926  BP: 104/60  Pulse: 69  Resp: (!) 26  Temp: 97.6 F (36.4 C)  SpO2: 93%  Weight: 90 lb 12.8 oz (41.2 kg)  Height: 5' 8 (1.727 m)   Body mass index is 13.81 kg/m. Physical Exam Vitals and nursing note reviewed.  Constitutional:      Comments: Chronically ill appearing, frail female. No distress  HENT:     Head: Normocephalic and atraumatic.  Cardiovascular:     Rate and Rhythm: Normal rate and regular rhythm.  Pulmonary:     Effort: No respiratory distress.     Breath sounds: Rales present.     Comments: Bedside thoracic U/S shows bibasilar lung consolidation but no pleural effusion. She is not in any respiratory distress  Bilateral mid and upper dry rales  Decreased BS in bilateral lower lobes Abdominal:     General: Abdomen is flat. Bowel sounds are normal.     Palpations: Abdomen is soft.  Skin:    General: Skin is warm and dry.     Capillary Refill: Capillary refill takes less than 2 seconds.  Neurological:     Mental Status: She is oriented to person, place, and time.     Labs reviewed: Recent Labs    06/07/24 1239 06/08/24 0635 06/12/24 0605 06/14/24 0000 07/13/24 0000  NA 129* 133* 135 137 135*  K 4.1 3.7 3.9 4.2 3.5  CL 94* 93* 96* 97* 93*  CO2 25 30 31  31* 34*  GLUCOSE 119* 127* 95  --   --   BUN 10 12 17 17 15   CREATININE 0.71 0.47 0.58 0.5 0.4*  CALCIUM 7.8* 7.5* 8.3* 8.4* 8.6*  MG 2.0 1.8 2.0  --   --   PHOS 2.4* 3.4 2.8  --   --    Recent Labs    06/05/24 1258  AST 23  ALT  17  ALKPHOS 110  BILITOT 0.9  PROT 6.4*  ALBUMIN 2.4*   Recent Labs    06/07/24 1239 06/08/24 0635 06/12/24 0605 06/14/24 0000 06/21/24 0000 07/13/24 0000  WBC 12.7* 11.4* 8.7 6.4 7.6 9.0  NEUTROABS  --   --   --  4,250.00 6,050.00 6,975.00  HGB 9.8* 8.0* 8.3* 9.3* 9.9* 9.6*  HCT 29.1* 23.4* 25.9* 29* 31* 29*  MCV 102.1* 101.7* 104.4*  --   --   --   PLT 465* 342 343 440* 343 367   Lab Results  Component Value Date   TSH 1.806 05/24/2024    Assessment/Plan Pneumonia due to infectious organism Pt without any fevers. Her recent CBC showed no leukocytosis. Pt with some loose stools. She is afebrile. I don't think she has clinical pneumonia and her lower lobe infiltrates could certainly be leftover inflammation from her recent admission to hospital. Her prior CTPA form 06-07-2024 does show some pretty dense consolidations. I think it would be best to stop the doxycycline  and see how she does off abx giving her slight nausea and loose stools. Will stop her scheduled reglan  and change it to prn. DC doxycycline .  Chronic respiratory failure with hypoxia, on home O2 therapy (HCC) - 2 L/min with activity Pt remains on 2 L/min O2 that was started recently. Dtr states she does get short winded quickly. She is comfortable at  rest today. Will continue with supplemental O2 and continue with aggressive pulmonary toileting.  Esophageal diverticulum, acquired - right side There has been consistent mention of pt's esophageal dilatation in her chart but I don't think the description matches the severity.  This is just a dilatation but a massive outpouching of her upper/mid esophagus. It nearly fills her right apex of her lunch.  Given her history of only wanting to eat small meals due to early satiety, preferentially lying on her left side due to discomfort while on her right side, I can only suspect this esophageal diverticulum is causing some retention of food/liquid. I advised pt and dtr that pt  needs to concentrate on consuming easy to drink liquid such as high protein shakes and to avoid eating large meals. Advised that pt should absolutely not lie down for at least 2-3 hours after eating to allow any food/liquids in her esophageal diverticulum an opportunity to clear with gravity. I think she is at high risk for aspiration.  Given her advanced age of 39, her severe ILD/pulmonary fibrosis and her chronically malnourished state/severe protein calorie malnutrition, I highly doubt that she would be any sort of surgical candidate for correction. In addition, given the size of her esophageal diverticulum, I highly doubt she would be a candidate for an endoscopic surgical correction. I did discuss this with her dtr [name]. She agrees that pt would not tolerate any major surgical procedure.  ILD (interstitial lung disease) (HCC) Chronic. Followed by pulmonology as outpatient.  Alteration in skin integrity due to moisture RN reports that pt with stage 2 injury to her sacral area. RN reports that she had taken pictures of her wound in [facility] chart. SNF wound care team to assess wound and initiate treatment.  Underweight (BMI < 18.5) Body mass index is 13.81 kg/m.   I personally spent a total of 55 minutes in the care of the patient today including preparing to see the patient, getting/reviewing separately obtained history, performing a medically appropriate exam/evaluation, counseling and educating, documenting clinical information in the EHR, and independently interpreting results.  Camellia Door, DO  [facility] [phone]

## 2024-07-19 NOTE — Assessment & Plan Note (Addendum)
 Chronic. Followed by pulmonology as outpatient.

## 2024-07-19 NOTE — Assessment & Plan Note (Addendum)
 Pt without any fevers. Her recent CBC showed no leukocytosis. Pt with some loose stools. She is afebrile. I don't think she has clinical pneumonia and her lower lobe infiltrates could certainly be leftover inflammation from her recent admission to hospital. Her prior CTPA form 06-07-2024 does show some pretty dense consolidations. I think it would be best to stop the doxycycline  and see how she does off abx giving her slight nausea and loose stools. Will stop her scheduled reglan  and change it to prn. DC doxycycline .

## 2024-07-19 NOTE — Assessment & Plan Note (Addendum)
 RN reports that pt with stage 2 injury to her sacral area. RN reports that she had taken pictures of her wound in [facility] chart. SNF wound care team to assess wound and initiate treatment.

## 2024-07-19 NOTE — Assessment & Plan Note (Signed)
 Body mass index is 13.81 kg/m.

## 2024-07-19 NOTE — Assessment & Plan Note (Addendum)
 There has been consistent mention of pt's esophageal dilatation in her chart but I don't think the description matches the severity.  This is just a dilatation but a massive outpouching of her upper/mid esophagus. It nearly fills her right apex of her lunch.  Given her history of only wanting to eat small meals due to early satiety, preferentially lying on her left side due to discomfort while on her right side, I can only suspect this esophageal diverticulum is causing some retention of food/liquid. I advised pt and dtr that pt needs to concentrate on consuming easy to drink liquid such as high protein shakes and to avoid eating large meals. Advised that pt should absolutely not lie down for at least 2-3 hours after eating to allow any food/liquids in her esophageal diverticulum an opportunity to clear with gravity. I think she is at high risk for aspiration.  Given her advanced age of 71, her severe ILD/pulmonary fibrosis and her chronically malnourished state/severe protein calorie malnutrition, I highly doubt that she would be any sort of surgical candidate for correction. In addition, given the size of her esophageal diverticulum, I highly doubt she would be a candidate for an endoscopic surgical correction. I did discuss this with her dtr [name]. She agrees that pt would not tolerate any major surgical procedure.

## 2024-07-20 ENCOUNTER — Other Ambulatory Visit: Payer: Self-pay | Admitting: Orthopedic Surgery

## 2024-07-20 MED ORDER — OXYCODONE HCL 5 MG PO TABS: 5.0000 mg | ORAL_TABLET | Freq: Two times a day (BID) | ORAL | 0 refills | Status: DC | PRN

## 2024-07-23 ENCOUNTER — Ambulatory Visit: Attending: Cardiology | Admitting: Cardiology

## 2024-07-23 ENCOUNTER — Encounter: Payer: Self-pay | Admitting: Cardiology

## 2024-07-23 ENCOUNTER — Non-Acute Institutional Stay (SKILLED_NURSING_FACILITY): Payer: Self-pay | Admitting: Internal Medicine

## 2024-07-23 VITALS — BP 92/58 | HR 68 | Wt 90.0 lb

## 2024-07-23 DIAGNOSIS — J9611 Chronic respiratory failure with hypoxia: Secondary | ICD-10-CM

## 2024-07-23 DIAGNOSIS — Z79899 Other long term (current) drug therapy: Secondary | ICD-10-CM | POA: Insufficient documentation

## 2024-07-23 DIAGNOSIS — K225 Diverticulum of esophagus, acquired: Secondary | ICD-10-CM | POA: Diagnosis not present

## 2024-07-23 DIAGNOSIS — R239 Unspecified skin changes: Secondary | ICD-10-CM | POA: Diagnosis not present

## 2024-07-23 DIAGNOSIS — Z9981 Dependence on supplemental oxygen: Secondary | ICD-10-CM | POA: Diagnosis not present

## 2024-07-23 DIAGNOSIS — Z5181 Encounter for therapeutic drug level monitoring: Secondary | ICD-10-CM | POA: Diagnosis not present

## 2024-07-23 DIAGNOSIS — I480 Paroxysmal atrial fibrillation: Secondary | ICD-10-CM | POA: Insufficient documentation

## 2024-07-23 DIAGNOSIS — J849 Interstitial pulmonary disease, unspecified: Secondary | ICD-10-CM | POA: Diagnosis not present

## 2024-07-23 DIAGNOSIS — D6869 Other thrombophilia: Secondary | ICD-10-CM | POA: Diagnosis not present

## 2024-07-23 DIAGNOSIS — L89152 Pressure ulcer of sacral region, stage 2: Secondary | ICD-10-CM | POA: Diagnosis not present

## 2024-07-23 MED ORDER — AMIODARONE HCL 200 MG PO TABS: 200.0000 mg | ORAL_TABLET | Freq: Every day | ORAL | Status: DC

## 2024-07-23 MED ORDER — APIXABAN 2.5 MG PO TABS: 2.5000 mg | ORAL_TABLET | Freq: Two times a day (BID) | ORAL | Status: DC

## 2024-07-23 MED ORDER — PREDNISONE 1 MG PO TBEC: 6.0000 mg | DELAYED_RELEASE_TABLET | ORAL | Status: DC

## 2024-07-23 NOTE — Assessment & Plan Note (Deleted)
 SNF wound care team is following her wounds

## 2024-07-23 NOTE — Progress Notes (Signed)
 [facility] Acute Care Progress Note    Location:  Other Nursing Home Room Number: 118 - [place] Place of Service:  SNF (31)   Crystal Comer POUR, NP   Patient Care Team: Crystal Comer POUR, NP as PCP - General (Internal Medicine) Cindie Ole DASEN, MD as PCP - Electrophysiology (Cardiology) Perla Evalene PARAS, MD as Consulting Physician (Cardiology) Dolphus Reiter, MD as Consulting Physician (Rheumatology) Tamea Dedra CROME, MD as Consulting Physician (Pulmonary Disease)   Extended Emergency Contact Information Primary Emergency Contact: [facility] Address: [address]          [place], [place] [address] [place] Home Phone: [phone] Mobile Phone: [phone] Relation: Daughter   Goals of care: Advanced Directive information    07/12/2024    9:35 AM  Advanced Directives  Does Patient Have a Medical Advance Directive? Yes  Type of Advance Directive Out of facility DNR (pink MOST or yellow form)  Does patient want to make changes to medical advance directive? No - Patient declined  Would patient like information on creating a medical advance directive? No - Patient declined      Chief Complaint  Patient presents with   early satiety    F/u on visit last week. Pt still having early satiety     HPI: Pt is a 89 y.o. female seen today for an acute visit for early satiety. Dtr Rolan states pt still having difficulty with early satiety. Pt still feels like food is getting stuck in her esophagus. Discussed at length with dtr that pt is not a candidate for any surgical procedure to correct esophageal diverticulum.  Pt's dtr is a International aid/development worker.   Pt still having difficulty eating lots of food. Discussed during last visit that pt to attempt to increase her protein intake. Dtr is trying to get pt to drink more ensure protein shakes. Dtr jokes that she is serving butter with a little bit of scone instead of a scone with a little bit of  butter.  Pt states she would never want a G-tube. Dtr agrees with this decision. RN reports pt still with buttock/sacral decub.      Past Medical History:  Diagnosis Date   Allergy    Anal fissure    Aspiration into airway    CAP (community acquired pneumonia) 08/23/2017   Chest pain    a. 09/04/2018 MV: EF >65%. No ischemia/infarct.   Coronary artery calcification seen on CT scan    Diastolic dysfunction    a. 10/2016 Echo: EF 60-65%, no rwma, Gr1 DD, mild MR, nl RV fxn, mild to mod TR, PASP ; b. 01/2022 Echo: EF 60-65%, GrI DD, RVVSP 37.25mmHg; c. 06/2024 Echo: EF 60-65%, no rwma, GrI DD, nl RV fxn, mild-mod MR, AoV sclerosis.   Diverticulosis    Dyspnea    Edema of right lower extremity 01/29/2022   Fever 06/15/2023   GERD (gastroesophageal reflux disease)    Hemorrhoids    Hyperplastic colon polyp    Interstitial lung disorders (HCC)    Persistent atrial fibrillation (HCC)    a. Dx 06/2024. CHA2DS2VASc = 3-->eliquis . Converted on Amiodarone  - plan for 1-2 mos rx.   Pulmonary fibrosis (HCC)    Septicemia (HCC) 1999   spent 4 weeks in ICU, intubated -? PNA   Vertebral fracture, pathological    Past Surgical History:  Procedure Laterality Date   ABDOMINAL HYSTERECTOMY     FLEXIBLE BRONCHOSCOPY N/A 07/05/2016   Procedure: FLEXIBLE BRONCHOSCOPY;  Surgeon: Jorie  Mungal, MD;  Location: [facility] ORS;  Service: Cardiopulmonary;  Laterality: N/A;   IR RADIOLOGIST EVAL & MGMT  11/05/2021     No Known Allergies   Outpatient Encounter Medications as of 07/23/2024  Medication Sig   acetaminophen  (TYLENOL ) 500 MG tablet Take 1,000 mg by mouth every 8 (eight) hours as needed.   amiodarone  (PACERONE ) 200 MG tablet Take 1 tablet (200 mg total) by mouth 2 (two) times daily for 6 days. (Patient not taking: Reported on 07/23/2024)   amiodarone  (PACERONE ) 200 MG tablet Take 1 tablet (200 mg total) by mouth daily. (Patient not taking: Reported on 07/23/2024)   apixaban  (ELIQUIS ) 2.5 MG TABS  tablet Take 1 tablet (2.5 mg total) by mouth 2 (two) times daily.   azaTHIOprine  (IMURAN ) 50 MG tablet TAKE ONE AND ONE-HALF TABLETS (75 MG) EVERY MORNING AND 1 TABLET (50 MG) EVERY EVENING (Patient not taking: Reported on 07/23/2024)   azaTHIOprine  (IMURAN ) 50 MG tablet Take 50 mg by mouth 2 (two) times daily. (Patient not taking: Reported on 07/23/2024)   bisacodyl  (DULCOLAX) 5 MG EC tablet Take 2 tablets (10 mg total) by mouth at bedtime.   Calcium Carb-Cholecalciferol (CALCIUM 600 + D PO) Take 1 tablet by mouth daily. (Patient not taking: Reported on 07/23/2024)   calcium carbonate (TUMS EX) 750 MG chewable tablet Chew 1 tablet by mouth daily.   cyanocobalamin  (VITAMIN B12) 500 MCG tablet Take 1 tablet (500 mcg total) by mouth daily.   denosumab  (PROLIA ) 60 MG/ML SOLN injection Inject 60 mg into the skin every 6 (six) months. Administer in upper arm, thigh, or abdomen   diphenhydrAMINE  (BENADRYL ) 12.5 MG/5ML elixir Take 5 mLs (12.5 mg total) by mouth every 8 (eight) hours as needed (Vertigo). (Patient not taking: Reported on 07/23/2024)   doxycycline  (DORYX ) 100 MG EC tablet Take 100 mg by mouth 2 (two) times daily.   esomeprazole  (NEXIUM ) 20 MG capsule Take 1 capsule (20 mg total) by mouth 2 (two) times daily before a meal.   gabapentin  (NEURONTIN ) 100 MG capsule Take 2 capsules (200 mg total) by mouth at bedtime. (Patient not taking: Reported on 07/23/2024)   gabapentin  (NEURONTIN ) 100 MG capsule Take 100 mg by mouth at bedtime.   ipratropium-albuterol  (DUONEB) 0.5-2.5 (3) MG/3ML SOLN Take 3 mLs by nebulization every 6 (six) hours as needed.   loratadine (CLARITIN) 10 MG tablet Take 10 mg by mouth daily. (Patient not taking: Reported on 07/23/2024)   LORazepam  (ATIVAN ) 0.5 MG tablet Take 1 tablet (0.5 mg total) by mouth every 8 (eight) hours as needed (Vertigo). (Patient not taking: Reported on 07/23/2024)   metoCLOPramide  (REGLAN ) 5 MG tablet Take 1 tablet (5 mg total) by mouth every 8 (eight)  hours as needed for nausea or vomiting (and Vertigo).   montelukast  (SINGULAIR ) 10 MG tablet Take 1 tablet (10 mg total) by mouth daily. (Patient not taking: Reported on 07/23/2024)   nystatin (MYCOSTATIN) 100000 UNIT/ML suspension Take 5 mLs by mouth 4 (four) times daily.   ondansetron  (ZOFRAN ) 4 MG tablet Take 1 tablet (4 mg total) by mouth daily as needed for nausea or vomiting. (Patient not taking: Reported on 07/23/2024)   oxyCODONE  (OXY IR/ROXICODONE ) 5 MG immediate release tablet Take 1 tablet (5 mg total) by mouth every 12 (twelve) hours as needed for severe pain (pain score 7-10). (Patient not taking: Reported on 07/23/2024)   OXYGEN  Inhale into the lungs. 2lpm prn   polyethylene glycol (MIRALAX  / GLYCOLAX ) 17 g packet Take 17 g by mouth  2 (two) times daily.   predniSONE  (DELTASONE ) 1 MG tablet Take 3 mg by mouth daily with breakfast. Every Monday, Wednesday, Friday and Sunday. Give 6mg  by mouth in the morning every Tuesday, Thursday and Saturday.   propranolol  (INDERAL ) 10 MG tablet Take 10 mg by mouth 2 (two) times daily.   sertraline (ZOLOFT) 100 MG tablet Take 100 mg by mouth daily.   sertraline (ZOLOFT) 25 MG tablet Take 25 mg by mouth daily. (Patient not taking: Reported on 07/23/2024)   sertraline (ZOLOFT) 50 MG tablet Take 50 mg by mouth daily. (Patient not taking: Reported on 07/23/2024)   SERTRALINE HCL PO Take 75 mg by mouth daily. (Patient not taking: Reported on 07/23/2024)   traZODone  (DESYREL ) 50 MG tablet Take 50 mg by mouth at bedtime.   Facility-Administered Encounter Medications as of 07/23/2024  Medication   denosumab  (PROLIA ) injection 60 mg   [START ON 08/04/2024] denosumab  (PROLIA ) injection 60 mg     Review of Systems  Constitutional:  Positive for appetite change.       Aorexia and early satiety  HENT: Negative.    Eyes: Negative.   Respiratory: Negative.    Gastrointestinal:        Resolved nausea  Endocrine: Negative.        Anorexia and early satiety   Genitourinary: Negative.   Musculoskeletal: Negative.   Skin:        RN reports pressure injury to skin of sacral/buttocks  Allergic/Immunologic: Negative.   Neurological: Negative.   Hematological: Negative.   Psychiatric/Behavioral: Negative.    All other systems reviewed and are negative.    Immunization History  Administered Date(s) Administered   Fluad Quad(high Dose 65+) 07/24/2019, 07/04/2022, 07/28/2023   INFLUENZA, HIGH DOSE SEASONAL PF 09/10/2016   Influenza Split 06/04/2012   Influenza,inj,Quad PF,6+ Mos 07/04/2013, 08/18/2015, 07/13/2017, 06/19/2018   Influenza-Unspecified 07/15/2020   Moderna SARS-COV2 Booster Vaccination 05/19/2020, 02/19/2021   Moderna Sars-Covid-2 Vaccination 10/22/2019, 11/19/2019, 04/02/2022   Pneumococcal Conjugate-13 10/06/2011   Pneumococcal Polysaccharide-23 06/19/2018   Zoster, Live 01/14/2007   Pertinent  Health Maintenance Due  Topic Date Due   Influenza Vaccine  05/04/2024   DEXA SCAN  Completed      04/26/2022    8:24 AM 12/22/2022    9:42 AM 04/28/2023    9:28 AM 12/20/2023   10:47 AM 01/25/2024    3:27 PM  Fall Risk  Falls in the past year? 0 0 0 0 0  Was there an injury with Fall? 0 0 0 0 0  Fall Risk Category Calculator 0 0 0 0 0  Fall Risk Category (Retired) Low       Patient at Risk for Falls Due to No Fall Risks No Fall Risks No Fall Risks No Fall Risks No Fall Risks  Fall risk Follow up Falls evaluation completed  Falls evaluation completed Falls prevention discussed;Falls evaluation completed Falls evaluation completed Falls evaluation completed     Data saved with a previous flowsheet row definition   Functional Status Survey:     Vitals:   07/23/24 0957  BP: 114/87  Pulse: 68  Resp: 16  Temp: (!) 97.5 F (36.4 C)  SpO2: 94%  Weight: 90 lb 12.8 oz (41.2 kg)   Body mass index is 13.81 kg/m. Physical Exam Vitals and nursing note reviewed.  Constitutional:      Comments: Elderly frail female  HENT:      Head: Normocephalic and atraumatic.  Pulmonary:     Effort: Pulmonary effort  is normal. No respiratory distress.  Abdominal:     General: Abdomen is flat.  Skin:    General: Skin is warm and dry.     Capillary Refill: Capillary refill takes less than 2 seconds.  Neurological:     Mental Status: She is alert and oriented to person, place, and time.      Labs reviewed: Recent Labs    06/07/24 1239 06/08/24 0635 06/12/24 0605 06/14/24 0000 07/13/24 0000  NA 129* 133* 135 137 135*  K 4.1 3.7 3.9 4.2 3.5  CL 94* 93* 96* 97* 93*  CO2 25 30 31  31* 34*  GLUCOSE 119* 127* 95  --   --   BUN 10 12 17 17 15   CREATININE 0.71 0.47 0.58 0.5 0.4*  CALCIUM 7.8* 7.5* 8.3* 8.4* 8.6*  MG 2.0 1.8 2.0  --   --   PHOS 2.4* 3.4 2.8  --   --    Recent Labs    06/05/24 1258  AST 23  ALT 17  ALKPHOS 110  BILITOT 0.9  PROT 6.4*  ALBUMIN 2.4*   Recent Labs    06/07/24 1239 06/08/24 0635 06/12/24 0605 06/14/24 0000 06/21/24 0000 07/13/24 0000  WBC 12.7* 11.4* 8.7 6.4 7.6 9.0  NEUTROABS  --   --   --  4,250.00 6,050.00 6,975.00  HGB 9.8* 8.0* 8.3* 9.3* 9.9* 9.6*  HCT 29.1* 23.4* 25.9* 29* 31* 29*  MCV 102.1* 101.7* 104.4*  --   --   --   PLT 465* 342 343 440* 343 367   Lab Results  Component Value Date   TSH 1.806 05/24/2024   Assessment & Plan Chronic respiratory failure with hypoxia, on home O2 therapy (HCC) Pt remains on 2 L/min. Discussed with dtr. Pt RA pulse >-=93% then pt does not need to wear O2. I suspect that pt will need to wear O2 with any activity.     Esophageal diverticulum, acquired Discussed with dtr that pt's early satiety due to her esophageal diverticulum(right side). Pt not a candidate for surgical repair. Pt also refuses G-tube placement. Dtr agrees. Only way to manage this is for pt to lean to her left side while eating to avoid letting any food pocket into her right sided esophageal diverticulum. Pt not to lie down flat within 2 hours of eating/drinking     Pressure injury of sacral region, stage 2 [facility]) SNF wound care team is following her wounds       Camellia Door, DO [facility] [phone]

## 2024-07-23 NOTE — Patient Instructions (Signed)
 Medication Instructions:   REDUCE amiodarone  to 100mg  daily (cut 200mg  tablets in half)  *If you need a refill on your cardiac medications before your next appointment, please call your pharmacy*    Follow-Up: At [facility], you and your health needs are our priority.  As part of our continuing mission to provide you with exceptional heart care, our providers are all part of one team.  This team includes your primary Cardiologist (physician) and Advanced Practice Providers or APPs (Physician Assistants and Nurse Practitioners) who all work together to provide you with the care you need, when you need it.  Your next appointment:   4 week(s)  Provider:   Fonda Kitty, MD or Jenaveve Fenstermaker, NP    We recommend signing up for the patient portal called MyChart.  Sign up information is provided on this After Visit Summary.  MyChart is used to connect with patients for Virtual Visits (Telemedicine).  Patients are able to view lab/test results, encounter notes, upcoming appointments, etc.  Non-urgent messages can be sent to your provider as well.   To learn more about what you can do with MyChart, go to [email or web address].

## 2024-07-23 NOTE — Assessment & Plan Note (Addendum)
 Discussed with dtr that pt's early satiety due to her esophageal diverticulum(right side). Pt not a candidate for surgical repair. Pt also refuses G-tube placement. Dtr agrees. Only way to manage this is for pt to lean to her left side while eating to avoid letting any food pocket into her right sided esophageal diverticulum. Pt not to lie down flat within 2 hours of eating/drinking

## 2024-07-23 NOTE — Progress Notes (Signed)
 Electrophysiology Clinic Note    Date:  07/23/2024  Patient ID:  Crystal, Gregory 1931-11-10, MRN [id number] PCP:  [name], NP  Cardiologist:  Evalene Lunger, MD  Electrophysiology APP:  Ladd Cen, NP     Discussed the use of AI scribe software for clinical note transcription with the patient, who gave verbal consent to proceed.   Patient Profile    Chief Complaint: AFib  History of Present Illness: Crystal Gregory is a 89 y.o. female with PMH notable for non-obs CAD, ILD/pulm fibrosis, RA; seen today for electrophysiology consult of AFib.   She presented to [facility] ER 06/06/2024 with SOB, palpitations. CT chest at that time with concerns for PNA. She was started on IV dilt and converted to sinus. She was started on low-dose eliquis , dose reduced for age, weight. She then had increased SOB with afib w RVR, and was started on IV amiodarone  > converted to sinus. She was converted to PO load at discharge.   On follow-up today, she had no awareness of arrhythmia during hospitalization, no palpitations. She has not had any recurrence of AFib that she is aware of. Vitals are checked daily by facility, and daughter checks pulse ox regularly and pulse is always stable in 60-70s.  She continues to take eliquis  BID, has bruising to hands and forearms and requests to stop it. She denies bleeding via GI/GU systems.  She was making physical improvements in her rehab, but was diagnosed with ?PNA and was in bed for several days, feels like her rehab gains have been completely lost now. She is anxious discussing her health, her goal is to return home.   Daughter joins for visit and provides considerable HPI.      Arrhythmia/Device History Amiodarone     ROS:  Please see the history of present illness. All other systems are reviewed and otherwise negative.    Physical Exam    VS:  BP (!) 92/58 (BP Location: Left Arm, Patient Position: Sitting, Cuff Size: Small)   Pulse 68    Wt 90 lb (40.8 kg)   SpO2 96% Comment: 2 L  BMI 13.68 kg/m  BMI: Body mass index is 13.68 kg/m.           Wt Readings from Last 3 Encounters:  07/23/24 90 lb 12.8 oz (41.2 kg)  07/23/24 90 lb (40.8 kg)  07/19/24 90 lb 12.8 oz (41.2 kg)     GEN- frail, chronically-ill appearing, alert and oriented x 3 today.   Lungs- increased work of breathing, diminished throughout. [place] in place Heart- Regular rate and rhythm, no murmurs, rubs or gallops Extremities- 1+ bilat pedal edema, warm, dry    Studies Reviewed   Previous EP, cardiology notes.    EKG is ordered. Personal review of EKG from today shows:    EKG Interpretation Date/Time:  Monday July 23 2024 11:13:32 EDT Ventricular Rate:  68 PR Interval:  136 QRS Duration:  78 QT Interval:  414 QTC Calculation: 440 R Axis:   59  Text Interpretation: Normal sinus rhythm Confirmed by Leotta Weingarten [phone]) on 07/23/2024 11:15:54 AM     TTE, 06/06/2024  1. Left ventricular ejection fraction, by estimation, is 60 to 65%. The left ventricle has normal function. The left ventricle has no regional wall motion abnormalities. Left ventricular diastolic parameters are consistent with Grade I diastolic dysfunction (impaired relaxation). The average left ventricular global longitudinal strain is -14.1 %.   2. Right ventricular systolic function is normal. The  right ventricular size is normal.   3. The mitral valve is normal in structure. Mild to moderate mitral valve  regurgitation. No evidence of mitral stenosis.   4. The aortic valve is normal in structure. Aortic valve regurgitation is not visualized. Aortic valve sclerosis is present, with no evidence of aortic valve stenosis.   5. The inferior vena cava is normal in size with greater than 50% respiratory variability, suggesting right atrial pressure of 3 mmHg.   TTE, 01/21/2022  1. Left ventricular ejection fraction, by estimation, is 60 to 65%. The left ventricle has normal function.  The left ventricle has no regional wall motion abnormalities. Left ventricular diastolic parameters are consistent with Grade I diastolic dysfunction (impaired relaxation). The average left ventricular global longitudinal strain is -20.3 %. The global longitudinal strain is normal.   2. Right ventricular systolic function is normal. The right ventricular size is normal. There is mildly elevated pulmonary artery systolic pressure. The estimated right ventricular systolic pressure is 37.8 mmHg.   3. Right atrial size was mildly dilated.   4. The mitral valve is normal in structure. No evidence of mitral valve regurgitation.   5. The aortic valve is tricuspid. Aortic valve regurgitation is not visualized.   6. The inferior vena cava is normal in size with <50% respiratory variability, suggesting right atrial pressure of 8 mmHg.     Assessment and Plan     #) parox AFib #) amiodarone  monitoring #) ILD, COPD on home Os Recently diagnosed with afib iso hospitalization for PNA Initially started on IV dilt > converted to sinus > recurrence with elevated ventricular rates so was transitioned to amiodarone  Has since been treated for PNA without recurrence of AFib Do not favor amiodarone  long term with patient's ILD and advanced COPD Discussion with patient and daughter regarding stopping amio today and consider alternative AAD if has recurrence, vs reducing dose.  Daughter favored reducing amiodarone  dose to 100mg  daily, and if continues to maintain sinus, will stop at follow-up   #) Hypercoag d/t afib CHA2DS2-VASc Score = at least 3 [CHF History: 0, HTN History: 0, Diabetes History: 0, Stroke History: 0, Vascular Disease History: 0, Age Score: 2, Gender Score: 1].  Therefore, the patient's annual risk of stroke is 3.2 %.    Stroke ppx - 2.5mg  eliquis  BID, appropriately dosed No significant bleeding concerns Recent CBC with stable hgb/hct         Current medicines are reviewed at length with  the patient today.   The patient has concerns regarding her medicines.  The following changes were made today:   REDUCE amiodarone  to 100mg  daily  Labs/ tests ordered today include:  Orders Placed This Encounter  Procedures   EKG 12-Lead     Disposition: Follow up with Dr. Kennyth or EP APP in 4 weeks   Signed, Chantal Needle, NP  07/23/24  12:36 PM  Electrophysiology [facility]

## 2024-07-23 NOTE — Assessment & Plan Note (Addendum)
 Pt remains on 2 L/min. Discussed with dtr. Pt RA pulse >-=93% then pt does not need to wear O2. I suspect that pt will need to wear O2 with any activity.

## 2024-07-24 NOTE — Assessment & Plan Note (Signed)
 SNF wound care team is following her wounds

## 2024-07-25 ENCOUNTER — Ambulatory Visit: Admitting: Pulmonary Disease

## 2024-07-26 ENCOUNTER — Ambulatory Visit: Admitting: Pulmonary Disease

## 2024-07-26 ENCOUNTER — Encounter: Payer: Self-pay | Admitting: Pulmonary Disease

## 2024-07-26 VITALS — BP 104/58 | HR 94 | Temp 97.7°F | Ht 68.0 in | Wt 89.8 lb

## 2024-07-26 DIAGNOSIS — F32A Depression, unspecified: Secondary | ICD-10-CM

## 2024-07-26 DIAGNOSIS — R0902 Hypoxemia: Secondary | ICD-10-CM | POA: Diagnosis not present

## 2024-07-26 DIAGNOSIS — F419 Anxiety disorder, unspecified: Secondary | ICD-10-CM | POA: Diagnosis not present

## 2024-07-26 DIAGNOSIS — E43 Unspecified severe protein-calorie malnutrition: Secondary | ICD-10-CM | POA: Diagnosis not present

## 2024-07-26 DIAGNOSIS — K220 Achalasia of cardia: Secondary | ICD-10-CM

## 2024-07-26 DIAGNOSIS — R053 Chronic cough: Secondary | ICD-10-CM | POA: Diagnosis not present

## 2024-07-26 DIAGNOSIS — K224 Dyskinesia of esophagus: Secondary | ICD-10-CM

## 2024-07-26 DIAGNOSIS — J8410 Pulmonary fibrosis, unspecified: Secondary | ICD-10-CM

## 2024-07-26 DIAGNOSIS — R627 Adult failure to thrive: Secondary | ICD-10-CM | POA: Diagnosis not present

## 2024-07-26 DIAGNOSIS — J479 Bronchiectasis, uncomplicated: Secondary | ICD-10-CM

## 2024-07-26 NOTE — Progress Notes (Signed)
 Subjective:    Patient ID: Crystal Gregory, female    DOB: 1931/12/05, 89 y.o.   MRN: [id number]  Patient Care Team: [name], NP as PCP - General (Internal Medicine) Perla Evalene PARAS, MD as PCP - Cardiology (Cardiology) Perla Evalene PARAS, MD as Consulting Physician (Cardiology) Dolphus Reiter, MD as Consulting Physician (Rheumatology) Tamea Dedra CROME, MD as Consulting Physician (Pulmonary Disease) Riddle, Suzann, NP as Nurse Practitioner (Clinical Cardiac Electrophysiology)  Chief Complaint  Patient presents with   Medical Management of Chronic Issues    Shortness of breath. Cough with phlegm    BACKGROUND/INTERVAL:89 y.o. with remote minimal smoking history (15 PY, quit 1982) initially evaluated by Dr. Theta 03/2016 and subsequently by Dr. Geronimo for pulmonary fibrosis. Pt has prior history of severe ARDS (1999) and had been on chronic nitrofurantoin  for recurrent UTIs. She has been tried on 2 maintenance inhalers without discernible benefit.  Since her prior visit on 24 May 2024 she has had significant decline in function after an episode of aspiration due to her severe esophageal dysmotility and achalasia.  She has been unable to feed well since her admission.   PROBLEMS: History of prior ARDS 1999 Prior long term nitrofurantoin  therapy Pulmonary fibrosis Bronchiectasis Mild COPD Rheumatoid arthritis (RF/CCP positive) Dysphagia with chronic silent aspiration Esophageal dilatation/achalasia with esophageal dysmotility   HPI Discussed the use of AI scribe software for clinical note transcription with the patient, who gave verbal consent to proceed.  History of Present Illness   Crystal Gregory is a 89 year old female with pulmonary fibrosis and chronic cough who presents with issues related to aspiration pneumonia and esophageal dilation due to achalasia and severe esophageal dysmotility. She is accompanied by a her daughter, Crystal Gregory, who is her primary  caregiver.  She has a history of pulmonary fibrosis and chronic cough. Recently, she has been experiencing issues with aspiration pneumonia, initially diagnosed during a hospital stay. She was treated with doxycycline  for a week, but her white blood cell count was not elevated and she had no fever. Despite this, she developed a need for supplemental oxygen , which she did not require during her previous three-week stay at the SNF.  She has esophageal dysmotility with achalasia (severe dilation of the esophagus) and has experienced significant weight loss. She has a limited capacity for food intake, describing it as 'the capacity of a lime.' She has difficulty consuming even small amounts of food or nutritional supplements like Ensure, as they 'get clogged' after a few sips, impacting her nutritional status and overall health.  Her daughter reports episodes of hyperventilation, possibly related to anxiety. She has difficulty managing these episodes, and her daughter often reminds her to adjust her breathing pattern, which can be distressing for both of them.  She has developed bed sores due to a recent period of reduced mobility following the suspected pneumonia diagnosis, which led to her being bedridden for several days. This has further impacted her physical endurance and ability to perform activities of daily living.  She is currently on 100 mg of Zoloft, but her daughter notes that any additional sedative medication tends to make her incoherent and distressed. She has expressed feelings of discouragement and dissatisfaction with her current quality of life, particularly in the evenings.  She is requesting to be placed under hospice care which given her rapid decline and multiple comorbidities without discernible treatment is reasonable.    DATA: CT chest 03/22/16: Generalized pulmonary hyperinflation but without bullous emphysema. Widespread bronchiectasis with bronchial  wall thickening consistent  with inflammatory bronchitis. Areas of pulmonary fibrosis, most pronounced in the lower lobes, where the bronchiectasis is more extensive, including areas of pulmonary lung destruction HRCT chest 06/11/16: Diffuse cylindrical and varicoid bronchiectasis throughout both lungs, most severe at the lung bases. Basilar predominant fibrotic interstitial lung disease with patchy honeycombing, with mild progression in the short interval since 03/22/2016 Spirometry 06/18/16: Mild obstruction, FEV1 1.42 liters (63%), FVC 2.43 (80%) Bronchoscopy 07/05/16: Normal airway exam. BAL negative for AFB Echocardiogram 10/07/15: LVEF 60%, Grade I DD, mild MR, RVSP estimate 35 mmHg HRCT 11/04/16: again compatible with interstitial lung disease, and considered diagnostic of usual interstitial pneumonia (UIP) from an imaging standpoint. There has been no significant progression of disease compared to the recent prior examination PFT 02/10/17: no obstruction, normal TLC, moderate reduction in DLCO 03/11/17: 345 m. No desaturation PFTs 11/22/17: No obstruction.  Lung volumes normal.  DLCO moderately reduced (55% predicted). NSC since 02/10/17 PFTs 11/23/18 : FVC: 2.67 L (95 %pred), FEV1: 2.35 L (112 %pred), FEV1/FVC: 88%, TLC: 4.98 L (88 %pred), DLCO 42 %pred 2D echo 11/16/2019:Left ventricular ejection fraction, by estimation, is 60 to 65%. Left ventricular diastolic parameters were normal.Right ventricular systolic function is normal. The right ventricular size is normal. Mildly elevated pulmonary artery systolic pressure. The estimated right ventricular systolic pressure is 39.4 mmHg. Left atrial size was mildly dilated.Tricuspid valve regurgitation is mild to moderate CT chest 02/15/2020:The appearance of the lungs is compatible with interstitial lung disease, with a spectrum of findings considered diagnostic of usual interstitial pneumonia (UIP) per current ATS guidelines. Minimal progression compared to the prior  examination. Barium swallow study 03/05/2020: Tertiary contractions of the esophagus mild spasm, one episode of tracheal aspiration noted without cough reflex, mild GERD. Overnight pulse oximetry 08/18/2020: No evidence of significant nocturnal desaturation. PFTs 08/31/2020: FEV1 2.16 L or 102% predicted, FVC 2.30 L or 80% predicted, FEV1 FVC 94% predicted, no bronchodilator response.  TLC 4.46 L or 78% predicted, DLCO 37% predicted. QuantiFERON gold 03/12/2021: Negative. CT high-res 03/24/2021: Progressively worsening interstitial lung disease likely UIP.  Markedly worsening bronchiectasis in the right lower lobe with acute airspace consolidation.  Pneumonia considered.  Trace partially loculated right pleural effusion. PFTs 03/25/2021: FEV1 1.38 L or 66% predicted, FVC 1.90 L or 67% predicted FEV1/FVC 73% no bronchodilator response.  Air-trapping noted.  Overall worsening on FEV1 from prior likely related to acute illness diffusion capacity severely reduced. Scleroderma panel 04/01/2021: Negative Chest CT without contrast 05/05/2021: No significant change in pulmonary fibrosis bronchiectasis and areas of honeycombing at the lung bases.  Are consistent with UIP.  The previously noted infiltrate has resolved. PFTs 06/02/2021: FEV1 1.59 L or 76% predicted, FVC 2.08 L or 74% predicted, FEV1/FVC 76%, no bronchodilator response.  Lung volumes normal, improvement from prior PFTs noted.  Diffusion capacity severely reduced however improved from prior. 2D echocardiogram 07/09/2021: LVEF 60 to 65%, grade 2 DD, mildly elevated pulmonary artery systolic pressure, RV systolic pressure 40.9 mmHg mild aortic sclerosis without stenosis.  Stable from prior. Swallow evaluation 07/10/2021: Normal swallow function.  No laryngeal penetration or tracheal aspiration. Chest x-ray PA and lateral 01/14/2022: Findings consistent with chronic pulmonary fibrosis and senescent changes, small right pleural effusion, 10 mm nodular  density in the right perihilar region unchanged from 2020 CT chest. Chest x-ray PA and lateral 06/16/2023: Pulmonary fibrosis, pleural scarring on right base, unchanged.  No acute abnormality of the lungs. CXR PA and lateral 11/04/2023: Pulmonary fibrosis, pleural scarring on the  right base, unchanged.  No acute abnormality. CT head 01/26/2024: No acute intracranial pathology, mild age-related atrophy and chronic microvascular ischemic changes, left maxillary sinus disease. Chest x-ray PA and lateral 11/04/2023: Chronic lung disease without significant change in radiographic appearance.  No acute airspace disease. MRI brain 05/08/2024: No acute intracranial abnormality age-related atrophy complete opacification of the left maxillary sinus. CT angio chest 06/07/2024: Extensive lower lobe predominant pulmonary opacities bilaterally slightly increased compared to prior and probably related to infection.  Small right and left pleural effusions.  Marked dilatation of the proximal esophagus esophagus above the carina measures up to 5.4 cm in diameter there is gas-filled esophagus and GE junction.  More distal esophagus is normal caliber at 1.2 cm.  No fluid in the esophagus.  No food stuff in the esophagus.  No evidence of esophageal obstruction.  This has progressed from CT of 2022.  No mediastinal adenopathy.   Review of Systems A 10 point review of systems was performed and it is as noted above otherwise negative.   Patient Active Problem List   Diagnosis Date Noted   Esophageal diverticulum, acquired - right side 07/19/2024   Underweight (BMI < 18.5) 07/19/2024   Chronic respiratory failure with hypoxia, on home O2 therapy (HCC) - 2 L/min with activity 07/19/2024   Alteration in skin integrity due to moisture 07/19/2024   Protein-calorie malnutrition, severe 06/07/2024   Atrial fibrillation with rapid ventricular response (HCC) 06/05/2024   Hyponatremia 06/05/2024   Multilobar lung infiltrate  06/05/2024   Vertigo 01/25/2024   Elevated brain natriuretic peptide (BNP) level 01/29/2022   Diastolic dysfunction with acute on chronic heart failure (HCC) 01/29/2022   Baker's cyst, right 01/14/2022   Asymptomatic varicose veins of both lower extremities 01/14/2022   DOE (dyspnea on exertion) 01/14/2022   Compression fracture of thoracic spine, non-traumatic (HCC) 11/26/2021   Tremor of both hands 11/26/2021   B12 deficiency 09/02/2020   Anemia 08/06/2020   TMJ (temporomandibular joint syndrome) 03/02/2018   RA (rheumatoid arthritis) (HCC) 07/13/2017   Polyarthralgia 04/25/2017   ILD (interstitial lung disease) (HCC) 06/18/2016   IPF (idiopathic pulmonary fibrosis) (HCC) 03/23/2016   Bronchiectasis without complication (HCC) 03/23/2016   Allergic rhinitis 03/22/2016   Anal fissure 08/18/2015   Insomnia 08/18/2015   Osteoporosis 02/13/2015   Stress due to illness of family member 09/16/2014   Hiatal hernia 10/13/2012   GERD (gastroesophageal reflux disease)    Allergy     Social History   Tobacco Use   Smoking status: Former    Current packs/day: 0.00    Average packs/day: 0.3 packs/day for 20.0 years (5.0 ttl pk-yrs)    Types: Cigarettes    Start date: 01/16/1961    Quit date: 01/16/1981    Years since quitting: 43.5   Smokeless tobacco: Never  Substance Use Topics   Alcohol use: Yes    Alcohol/week: 7.0 standard drinks of alcohol    Types: 7 Standard drinks or equivalent per week    No Known Allergies  Current Meds  Medication Sig   acetaminophen  (TYLENOL ) 500 MG tablet Take 1,000 mg by mouth every 8 (eight) hours as needed.   amiodarone  (PACERONE ) 200 MG tablet Take 1 tablet (200 mg total) by mouth daily. (Patient taking differently: Take 100 mg by mouth daily.)   apixaban  (ELIQUIS ) 2.5 MG TABS tablet Take 1 tablet (2.5 mg total) by mouth 2 (two) times daily.   azaTHIOprine  (IMURAN ) 50 MG tablet Take 50 mg by mouth 2 (two) times daily.  cyanocobalamin  (VITAMIN  B12) 500 MCG tablet Take 1 tablet (500 mcg total) by mouth daily.   denosumab  (PROLIA ) 60 MG/ML SOLN injection Inject 60 mg into the skin every 6 (six) months. Administer in upper arm, thigh, or abdomen   esomeprazole  (NEXIUM ) 20 MG capsule Take 1 capsule (20 mg total) by mouth 2 (two) times daily before a meal.   gabapentin  (NEURONTIN ) 100 MG capsule Take 100 mg by mouth at bedtime.   ipratropium-albuterol  (DUONEB) 0.5-2.5 (3) MG/3ML SOLN Take 3 mLs by nebulization every 6 (six) hours as needed.   OXYGEN  Inhale into the lungs. 2lpm prn   predniSONE  (DELTASONE ) 1 MG tablet Take 3 mg by mouth 4 (four) times a week. 3 mg Every Monday, Wednesday, Friday and Sunday with breakfast   predniSONE  1 MG TBEC Take 6 mg by mouth 3 (three) times a week. Give 6mg  by mouth in the morning every Tuesday, Thursday and Saturday with breakfast   sertraline (ZOLOFT) 100 MG tablet Take 100 mg by mouth daily.   traZODone  (DESYREL ) 50 MG tablet Take 50 mg by mouth at bedtime.   Current Facility-Administered Medications for the 07/26/24 encounter (Office Visit) with Tamea Dedra CROME, MD  Medication   denosumab  (PROLIA ) injection 60 mg   [START ON 08/04/2024] denosumab  (PROLIA ) injection 60 mg    Immunization History  Administered Date(s) Administered   Fluad Quad(high Dose 65+) 07/24/2019, 07/04/2022, 07/28/2023   INFLUENZA, HIGH DOSE SEASONAL PF 09/10/2016   Influenza Split 06/04/2012   Influenza,inj,Quad PF,6+ Mos 07/04/2013, 08/18/2015, 07/13/2017, 06/19/2018   Influenza-Unspecified 07/15/2020   Moderna SARS-COV2 Booster Vaccination 05/19/2020, 02/19/2021   Moderna Sars-Covid-2 Vaccination 10/22/2019, 11/19/2019, 04/02/2022   Pneumococcal Conjugate-13 10/06/2011   Pneumococcal Polysaccharide-23 06/19/2018   Zoster, Live 01/14/2007        Objective:     BP (!) 104/58   Pulse 94   Temp 97.7 F (36.5 C) (Temporal)   Ht 5' 8 (1.727 m)   Wt 89 lb 12.8 oz (40.7 kg) Comment: per pt  SpO2 94% Comment:  2L oxygen   BMI 13.65 kg/m   SpO2: 94 % (2L oxygen )  GENERAL: Chronically ill-appearing, thin, woman in no acute distress though she appears frail and debilitated.  Presents in transport chair. She is chronically pale.  Wearing oxygen  at 2 L/min via nasal cannula. HEAD: Normocephalic, atraumatic. EYES: Pupils equal, round, reactive to light.  No scleral icterus. EARS: Wears hearing aids. MOUTH: Dentition intact, some teeth in poor repair.  Oral mucosa moist.  No thrush. NECK: Supple. No thyromegaly. Trachea midline. No JVD.  No adenopathy. PULMONARY: Good air entry bilaterally.  Coarse breath sounds at the bases,crackles on the right base (chronic), otherwise no adventitious sounds.   CARDIOVASCULAR: S1 and S2.  Regular rate and rhythm.  No rubs, murmurs or gallops heard.   ABDOMEN: Nondistended, scaphoid.   MUSCULOSKELETAL: Mild rheumatoid arthritis changes from hands, no active synovitis, no clubbing, bilateral ankle edema. NEUROLOGIC: No overt focal deficit, unsteady gait, speech is fluent. SKIN: Intact,warm,dry.   PSYCH: Mood and behavior are normal.  I have reviewed the imaging studies performed 05 June 2024 and 07 June 2024.  Assessment & Plan:     ICD-10-CM   1. Postinflammatory pulmonary fibrosis (HCC)  J84.10 Ambulatory referral to Hospice    2. Bronchiectasis without complication (HCC)  J47.9     3. Failure to thrive syndrome, adult  R62.7    With severe protein calorie malnutrition    4. Esophageal dysmotility  K22.4  With inability to feed    5. Achalasia of esophagus  K22.0     6. Severe protein-calorie malnutrition  E43       Orders Placed This Encounter  Procedures   Ambulatory referral to Hospice    Referral Priority:   Routine    Referral Type:   Consultation    Referral Reason:   Care Coordination    Requested Specialty:   Hospice Services    Number of Visits Requested:   1   Discussion:    Pulmonary fibrosis with chronic cough and acute  to subacute hypoxemia (oxygen  dependence) Chronic pulmonary fibrosis with associated chronic cough and hypoxemia requiring oxygen  therapy after recent aspiration pneumonia. Oxygen  saturation ranges from 93-94% on oxygen , dropping to high 80s without it. The condition requires ongoing oxygen  support. - Continue oxygen  therapy to maintain adequate oxygen  saturation.  Esophageal achalasia with severe dysphagia and esophageal motility Significant esophageal dilation (achalasia) causing severe dysphagia due to dysmotility and contributing to aspiration risk.  No possible interventions. Managed with dietary modifications and positioning during meals. - Encourage small, frequent meals with liquids and tilting to the left during eating to minimize aspiration risk. - Consult with gastroenterology as needed for further management.  Protein-calorie malnutrition with weight loss and poor oral intake Severe dysphagia and esophageal dilation leading to poor oral intake and significant weight loss. Difficulty consuming adequate nutrition, with limited capacity for food intake. - Encourage small, frequent meals every 2-3 hours to increase calorie intake.  Aspiration pneumonia, resolved Recent episode of aspiration pneumonia, likely due to esophageal issues, has resolved. No current signs of pneumonia such as elevated white count or fever.  Persistent infiltrates will take time to resolve  Functional decline with deconditioning and impaired mobility Recent functional decline and deconditioning due to bed rest following misdiagnosed pneumonia. Impaired mobility and development of bed sores noted.  Decubitus ulcer (pressure ulcer) Development of pressure ulcer due to recent functional decline and bed rest. Multiple wound care evaluations ongoing.  Anxiety with breathing pattern disturbance Anxiety contributing to hyperventilation and breathing pattern disturbance. Difficulty in controlling breathing due to  central nervous system involvement. Current medication includes Zoloft, but benzodiazepines are avoided due to adverse effects. - Encourage practice of slow, controlled breathing techniques. - Continue Zoloft 100 mg daily. - Avoid benzodiazepines due to risk of incoherence.  Depression Depressive symptoms likely exacerbated by current health status and functional limitations. Current treatment includes Zoloft. - Continue Zoloft 100 mg daily.  Transition to hospice care Discussion of transition to hospice care due to overall decline and preference for comfort-focused care. She expresses openness to hospice to reduce medical interventions and improve quality of life. - Send consult to [facility] hospice for evaluation and potential transition to hospice care.      Advised if symptoms do not improve or worsen, to please contact office for sooner follow up or seek emergency care.    I spent 45 minutes of dedicated to the care of this patient on the date of this encounter to include pre-visit review of records, face-to-face time with the patient discussing conditions above, post visit ordering of testing, clinical documentation with the electronic health record, making appropriate referrals as documented, and communicating necessary findings to members of the patients care team.     C. Leita Sanders, MD Advanced Bronchoscopy PCCM [facility]    *This note was generated using voice recognition software/Dragon and/or AI transcription program.  Despite best efforts to proofread, errors can occur which can  change the meaning. Any transcriptional errors that result from this process are unintentional and may not be fully corrected at the time of dictation.

## 2024-07-26 NOTE — Patient Instructions (Addendum)
 VISIT SUMMARY:  During your visit, we discussed your ongoing health issues, including pulmonary fibrosis, esophageal diverticulum, and recent aspiration pneumonia. We also addressed your nutritional challenges, anxiety, depression, and the development of bed sores. We talked about the possibility of transitioning to hospice care to focus on your comfort and quality of life.  YOUR PLAN:  -PULMONARY FIBROSIS WITH CHRONIC COUGH AND CHRONIC HYPOXEMIA: Pulmonary fibrosis is a lung disease that causes scarring and stiffness in the lungs, leading to chronic cough and low oxygen  levels. You will need to continue using oxygen  therapy to maintain adequate oxygen  saturation levels.  -ESOPHAGEAL DILATION WITH SEVERE DYSPHAGIA : Esophageal dilation is causing difficulty swallowing and increasing the risk of aspiration. You should have small, frequent meals with liquids and tilt to the left while eating to reduce the risk of aspiration. We may consult with a gastroenterologist for further management.  -PROTEIN-CALORIE MALNUTRITION WITH WEIGHT LOSS AND POOR ORAL INTAKE: Due to difficulty swallowing and esophageal issues, you have experienced significant weight loss and poor nutrition. It is important to have small, frequent meals every 2-3 hours to increase your calorie intake.  -ASPIRATION PNEUMONIA, RESOLVED: Aspiration pneumonia occurs when food or liquid is inhaled into the lungs, causing infection. Your recent episode has resolved, and there are no current signs of pneumonia.  -FUNCTIONAL DECLINE WITH DECONDITIONING AND IMPAIRED MOBILITY: Your recent period of bed rest has led to a decline in your physical abilities and mobility. This has also contributed to the development of bed sores.  -DECUBITUS ULCER (PRESSURE ULCER): Pressure ulcers, also known as bed sores, have developed due to prolonged bed rest. Ongoing wound care evaluations are being conducted to manage these ulcers.  -ANXIETY WITH BREATHING  PATTERN DISTURBANCE: Anxiety can cause hyperventilation and difficulty controlling your breathing. You should practice slow, controlled breathing techniques and continue taking Zoloft 100 mg daily. Avoid benzodiazepines as they can cause incoherence.  -DEPRESSION: Depression is a mood disorder that can be worsened by your current health status and limitations. Continue taking Zoloft 100 mg daily to help manage your symptoms.  -TRANSITION TO HOSPICE CARE: We discussed the option of transitioning to hospice care to focus on comfort and quality of life. A consult will be sent to [facility] hospice for evaluation and potential transition to hospice care.  INSTRUCTIONS:  A consult will be sent to [facility] hospice for evaluation and potential transition to hospice care.

## 2024-07-30 ENCOUNTER — Telehealth: Payer: Self-pay

## 2024-07-30 NOTE — Transitions of Care (Post Inpatient/ED Visit) (Signed)
   07/30/2024  Name: Crystal Gregory MRN: [id number] DOB: 12-25-1931  Today's TOC FU Call Status: Today's TOC FU Call Status:: Successful TOC FU Call Completed TOC FU Call Complete Date: 07/30/24 Patient's Name and Date of Birth confirmed.  Transition Care Management Follow-up Telephone Call Date of Discharge: 07/28/24 Discharge Facility: Other ([facility]) Name of Other ([facility]) Discharge Facility: [facility] Type of Discharge: Inpatient Admission Primary Inpatient Discharge Diagnosis:: weakness, pressure ulcer How have you been since you were released from the hospital?: Same Any questions or concerns?: No  Items Reviewed: Did you receive and understand the discharge instructions provided?: Yes Medications obtained,verified, and reconciled?: Yes (Medications Reviewed) Any new allergies since your discharge?: No Dietary orders reviewed?: Yes Do you have support at home?: Yes People in Home [RPT]: child(ren), adult  Medications Reviewed Today: Medications Reviewed Today   Medications were not reviewed in this encounter     Home Care and Equipment/Supplies: Were Home Health Services Ordered?: NA Any new equipment or medical supplies ordered?: NA  Functional Questionnaire: Do you need assistance with bathing/showering or dressing?: Yes Do you need assistance with meal preparation?: Yes Do you need assistance with eating?: No Do you have difficulty maintaining continence: Yes Do you need assistance with getting out of bed/getting out of a chair/moving?: Yes Do you have difficulty managing or taking your medications?: Yes  Follow up appointments reviewed: PCP Follow-up appointment confirmed?: No (patient in Hospice) MD Provider Line Number:[phone] Given: No Specialist Hospital Follow-up appointment confirmed?: NA Do you need transportation to your follow-up appointment?: No Do you understand care options if your condition(s) worsen?: Yes-patient verbalized  understanding    SIGNATURE       Julian Lemmings, LPN [facility] Nurse Health Advisor Direct Dial [phone]

## 2024-07-31 ENCOUNTER — Non-Acute Institutional Stay (SKILLED_NURSING_FACILITY): Payer: Self-pay | Admitting: Internal Medicine

## 2024-07-31 ENCOUNTER — Encounter: Payer: Self-pay | Admitting: Internal Medicine

## 2024-07-31 DIAGNOSIS — Z515 Encounter for palliative care: Secondary | ICD-10-CM

## 2024-08-01 DIAGNOSIS — Z515 Encounter for palliative care: Secondary | ICD-10-CM | POA: Insufficient documentation

## 2024-08-04 NOTE — Assessment & Plan Note (Addendum)
 Pt expired on 08-01-2024. Cause of death was chronic respiratory failure with hypoxia. Due to interstitial lung disease.

## 2024-08-04 NOTE — Progress Notes (Unsigned)
 [facility] SNF Admission H&P  Provider: Camellia Door, DO Location:  Other [place].  Nursing Home Room Number: [facility] [id number] Place of Service:  SNF (31)   PCP: [name], NP Patient Care Team: [name], NP as PCP - General (Internal Medicine) Perla Evalene PARAS, MD as PCP - Cardiology (Cardiology) Perla Evalene PARAS, MD as Consulting Physician (Cardiology) Dolphus Reiter, MD as Consulting Physician (Rheumatology) Tamea Dedra CROME, MD as Consulting Physician (Pulmonary Disease) Riddle, Suzann, NP as Nurse Practitioner (Clinical Cardiac Electrophysiology)   Extended Emergency Contact Information Primary Emergency Contact: [facility] Address: [address]          [place], [place] [address] [place] Home Phone: [phone] Mobile Phone: [phone] Relation: Daughter   Goals of Care: Advanced Directive information    07/12/2024    9:35 AM  Advanced Directives  Does Patient Have a Medical Advance Directive? Yes  Type of Advance Directive Out of facility DNR (pink MOST or yellow form)  Does patient want to make changes to medical advance directive? No - Patient declined  Would patient like information on creating a medical advance directive? No - Patient declined    CODE STATUS: Do Not Resuscitate (DNR)    Chief Complaint  Patient presents with   Admission    Admission     HPI: Patient is a 89 y.o. female seen today for admission to [facility].  Patient had been discharged from SNF 2 days prior.  She had gone back to her independent living home with her daughter Rolan.  She was able to spend 2 nights at home.  Daughter had a conversation with the patient and the patient want to be enrolled in hospice care.  She was readmitted back to [facility] with hospice care with the daughter paying privately for her room and board.  Daughter and a granddaughter was able to see her today before she expired.  She  passed away with her family at her bedside.  I had a conversation with the daughter and the granddaughter outside the patient's room and the son room.  Hospice chaplain was at the bedside providing emotional and spiritual support.  Daughter thanked me personally for the care I provided her mother.  She was very appreciative of all of the great care the patient had while she was in rehab.  We were able to review the patient's wife and that the patient still had full cognitive functions before her death.  Family was appreciative that the patient was able to make this decision on her own and spare the family a very difficult decision.  This is a comprehensive admission note to this SNFperformed on this date less than 30 days from date of admission. Included are preadmission medical/surgical history; reconciled medication list; family history; social history and comprehensive review of systems.   Corrections and additions to the records were documented. Comprehensive physical exam was also performed. Additionally a clinical summary was entered for each active diagnosis pertinent to this admission in the Problem List to enhance continuity of care.   Past Medical History:  Diagnosis Date   Allergy    Anal fissure    Aspiration into airway    CAP (community acquired pneumonia) 08/23/2017   Chest pain    a. 09/04/2018 MV: EF >65%. No ischemia/infarct.   Coronary artery calcification seen on CT scan    Diastolic dysfunction    a. 10/2016 Echo: EF 60-65%, no rwma, Gr1  DD, mild MR, nl RV fxn, mild to mod TR, PASP ; b. 01/2022 Echo: EF 60-65%, GrI DD, RVVSP 37.50mmHg; c. 06/2024 Echo: EF 60-65%, no rwma, GrI DD, nl RV fxn, mild-mod MR, AoV sclerosis.   Diverticulosis    Dyspnea    Edema of right lower extremity 01/29/2022   Fever 06/15/2023   GERD (gastroesophageal reflux disease)    Hemorrhoids    Hyperplastic colon polyp    Interstitial lung disorders (HCC)    Persistent atrial fibrillation  (HCC)    a. Dx 06/2024. CHA2DS2VASc = 3-->eliquis . Converted on Amiodarone  - plan for 1-2 mos rx.   Pulmonary fibrosis (HCC)    Septicemia (HCC) 1999   spent 4 weeks in ICU, intubated -? PNA   Vertebral fracture, pathological    Past Surgical History:  Procedure Laterality Date   ABDOMINAL HYSTERECTOMY     FLEXIBLE BRONCHOSCOPY N/A 07/05/2016   Procedure: FLEXIBLE BRONCHOSCOPY;  Surgeon: Jorie Cha, MD;  Location: [facility] ORS;  Service: Cardiopulmonary;  Laterality: N/A;   IR RADIOLOGIST EVAL & MGMT  11/05/2021    reports that she quit smoking about 43 years ago. Her smoking use included cigarettes. She started smoking about 63 years ago. She has a 5 pack-year smoking history. She has never used smokeless tobacco. She reports current alcohol use of about 7.0 standard drinks of alcohol per week. She reports that she does not use drugs. Social History   Socioeconomic History   Marital status: Widowed    Spouse name: Not on file   Number of children: 2   Years of education: Not on file   Highest education level: Not on file  Occupational History   Occupation: Retired   Tobacco Use   Smoking status: Former    Current packs/day: 0.00    Average packs/day: 0.3 packs/day for 20.0 years (5.0 ttl pk-yrs)    Types: Cigarettes    Start date: 01/16/1961    Quit date: 01/16/1981    Years since quitting: 43.5   Smokeless tobacco: Never  Vaping Use   Vaping status: Never Used  Substance and Sexual Activity   Alcohol use: Yes    Alcohol/week: 7.0 standard drinks of alcohol    Types: 7 Standard drinks or equivalent per week   Drug use: No   Sexual activity: Not Currently  Other Topics Concern   Not on file  Social History Narrative   Married.     2 children.  Lives with her husband in [place].   Social Drivers of Corporate Investment Banker Strain: Low Risk  (04/28/2023)   Overall Financial Resource Strain (CARDIA)    Difficulty of Paying Living Expenses: Not hard at all  Food  Insecurity: No Food Insecurity (06/06/2024)   Hunger Vital Sign    Worried About Running Out of Food in the Last Year: Never true    Ran Out of Food in the Last Year: Never true  Transportation Needs: No Transportation Needs (06/06/2024)   PRAPARE - Administrator, Civil Service (Medical): No    Lack of Transportation (Non-Medical): No  Physical Activity: Sufficiently Active (04/28/2023)   Exercise Vital Sign    Days of Exercise per Week: 4 days    Minutes of Exercise per Session: 40 min  Stress: No Stress Concern Present (04/28/2023)   [employer] of Occupational Health - Occupational Stress Questionnaire    Feeling of Stress : Not at all  Social Connections: Moderately Isolated (06/06/2024)   Social Connection and  Isolation Panel    Frequency of Communication with Friends and Family: Three times a week    Frequency of Social Gatherings with Friends and Family: Three times a week    Attends Religious Services: Never    Active Member of Clubs or Organizations: Yes    Attends Banker Meetings: More than 4 times per year    Marital Status: Widowed  Intimate Partner Violence: Not At Risk (06/06/2024)   Humiliation, Afraid, Rape, and Kick questionnaire    Fear of Current or Ex-Partner: No    Emotionally Abused: No    Physically Abused: No    Sexually Abused: No     Functional Status Survey:     Family History  Problem Relation Age of Onset   Parkinson's disease Mother    Colon polyps Mother 67           Ulcers Father    Rheumatic fever Father    Macular degeneration Sister    Healthy Son    Healthy Daughter      Health Maintenance  Topic Date Due   Zoster Vaccines- Shingrix (1 of 2) 12/23/1950   Influenza Vaccine  05/04/2024   COVID-19 Vaccine (5 - Moderna risk 2025-26 season) 01/25/2025   Pneumococcal Vaccine: 50+ Years  Completed   DEXA SCAN  Completed   Meningococcal B Vaccine  Aged Out   DTaP/Tdap/Td  Discontinued     No Known Allergies    Outpatient Encounter Medications as of 07/16/2024  Medication Sig   amiodarone  (PACERONE ) 100 MG tablet Take 100 mg by mouth daily.   azaTHIOprine  (IMURAN ) 50 MG tablet Take 50 mg by mouth 2 (two) times daily.   bisacodyl  (DULCOLAX) 5 MG EC tablet Take 2 tablets (10 mg total) by mouth at bedtime.   gabapentin  (NEURONTIN ) 100 MG capsule Take 100 mg by mouth at bedtime.   ipratropium-albuterol  (DUONEB) 0.5-2.5 (3) MG/3ML SOLN Take 3 mLs by nebulization every 6 (six) hours as needed.   metoCLOPramide  (REGLAN ) 5 MG tablet Take 1 tablet (5 mg total) by mouth every 8 (eight) hours as needed for nausea or vomiting (and Vertigo).   Morphine  Sulfate (MORPHINE  CONCENTRATE) 10 mg / 0.5 ml concentrated solution Take 10 mg by mouth every 2 (two) hours as needed for severe pain (pain score 7-10).   OXYGEN  Inhale into the lungs. 4lpm prn   polyethylene glycol (MIRALAX  / GLYCOLAX ) 17 g packet Take 17 g by mouth daily as needed.   predniSONE  (DELTASONE ) 5 MG tablet Take 5 mg by mouth daily with breakfast.   acetaminophen  (TYLENOL ) 500 MG tablet Take 1,000 mg by mouth every 8 (eight) hours as needed. (Patient not taking: Reported on 07/14/2024)   amiodarone  (PACERONE ) 200 MG tablet Take 1 tablet (200 mg total) by mouth daily. (Patient not taking: Reported on 07/20/2024)   apixaban  (ELIQUIS ) 2.5 MG TABS tablet Take 1 tablet (2.5 mg total) by mouth 2 (two) times daily. (Patient not taking: Reported on 07/18/2024)   azaTHIOprine  (IMURAN ) 50 MG tablet TAKE ONE AND ONE-HALF TABLETS (75 MG) EVERY MORNING AND 1 TABLET (50 MG) EVERY EVENING (Patient not taking: Reported on 07/18/2024)   Calcium Carb-Cholecalciferol (CALCIUM 600 + D PO) Take 1 tablet by mouth daily. (Patient not taking: Reported on 07/15/2024)   calcium carbonate (TUMS EX) 750 MG chewable tablet Chew 1 tablet by mouth daily. (Patient not taking: Reported on 07/15/2024)   cyanocobalamin  (VITAMIN B12) 500 MCG tablet Take 1 tablet (500 mcg total) by mouth  daily. (  Patient not taking: Reported on 07/09/2024)   denosumab  (PROLIA ) 60 MG/ML SOLN injection Inject 60 mg into the skin every 6 (six) months. Administer in upper arm, thigh, or abdomen (Patient not taking: Reported on 07/09/2024)   diphenhydrAMINE  (BENADRYL ) 12.5 MG/5ML elixir Take 5 mLs (12.5 mg total) by mouth every 8 (eight) hours as needed (Vertigo). (Patient not taking: Reported on 07/21/2024)   doxycycline  (DORYX ) 100 MG EC tablet Take 100 mg by mouth 2 (two) times daily. (Patient not taking: Reported on 07/20/2024)   esomeprazole  (NEXIUM ) 20 MG capsule Take 1 capsule (20 mg total) by mouth 2 (two) times daily before a meal. (Patient not taking: Reported on 07/13/2024)   gabapentin  (NEURONTIN ) 100 MG capsule Take 2 capsules (200 mg total) by mouth at bedtime. (Patient not taking: Reported on 07/21/2024)   loratadine (CLARITIN) 10 MG tablet Take 10 mg by mouth daily. (Patient not taking: Reported on 07/23/2024)   LORazepam  (ATIVAN ) 0.5 MG tablet Take 1 tablet (0.5 mg total) by mouth every 8 (eight) hours as needed (Vertigo). (Patient not taking: Reported on 07/04/2024)   montelukast  (SINGULAIR ) 10 MG tablet Take 1 tablet (10 mg total) by mouth daily. (Patient not taking: Reported on 07/13/2024)   nystatin (MYCOSTATIN) 100000 UNIT/ML suspension Take 5 mLs by mouth 4 (four) times daily. (Patient not taking: Reported on 07/22/2024)   ondansetron  (ZOFRAN ) 4 MG tablet Take 1 tablet (4 mg total) by mouth daily as needed for nausea or vomiting. (Patient not taking: Reported on 07/17/2024)   oxyCODONE  (OXY IR/ROXICODONE ) 5 MG immediate release tablet Take 1 tablet (5 mg total) by mouth every 12 (twelve) hours as needed for severe pain (pain score 7-10). (Patient not taking: Reported on 07/09/2024)   polyethylene glycol (MIRALAX  / GLYCOLAX ) 17 g packet Take 17 g by mouth 2 (two) times daily. (Patient not taking: Reported on 07/30/2024)   predniSONE  (DELTASONE ) 1 MG tablet Take 3 mg by mouth 4 (four)  times a week. 3 mg Every Monday, Wednesday, Friday and Sunday with breakfast (Patient not taking: Reported on 07/22/2024)   predniSONE  1 MG TBEC Take 6 mg by mouth 3 (three) times a week. Give 6mg  by mouth in the morning every Tuesday, Thursday and Saturday with breakfast (Patient not taking: Reported on 07/11/2024)   propranolol  (INDERAL ) 10 MG tablet Take 10 mg by mouth 2 (two) times daily. (Patient not taking: Reported on 07/27/2024)   sertraline (ZOLOFT) 100 MG tablet Take 100 mg by mouth daily. (Patient not taking: Reported on 07/04/2024)   sertraline (ZOLOFT) 25 MG tablet Take 25 mg by mouth daily. (Patient not taking: Reported on 07/25/2024)   sertraline (ZOLOFT) 50 MG tablet Take 50 mg by mouth daily. (Patient not taking: Reported on 07/09/2024)   SERTRALINE HCL PO Take 75 mg by mouth daily. (Patient not taking: Reported on 07/22/2024)   traZODone  (DESYREL ) 50 MG tablet Take 50 mg by mouth at bedtime. (Patient not taking: Reported on 08/01/2024)   Facility-Administered Encounter Medications as of 07/30/2024  Medication   denosumab  (PROLIA ) injection 60 mg   [START ON 08/04/2024] denosumab  (PROLIA ) injection 60 mg     Review of Systems  Unable to perform ROS: Other  Pt has expired/   Vitals:   07/16/2024 0959  BP: (!) 124/50  Pulse: 83  Resp: 20  Temp: 98.2 F (36.8 C)  SpO2: 94%  Weight: 89 lb 12.8 oz (40.7 kg)  Height: 5' 8 (1.727 m)   Body mass index is 13.65 kg/m. Physical Exam Constitutional:  Comments: Pt has expired.  Cardiovascular:     Comments: No heart beat Pulmonary:     Comments: No breathing.     Labs reviewed: Basic Metabolic Panel: Recent Labs    06/07/24 1239 06/08/24 0635 06/12/24 0605 06/14/24 0000 07/13/24 0000  NA 129* 133* 135 137 135*  K 4.1 3.7 3.9 4.2 3.5  CL 94* 93* 96* 97* 93*  CO2 25 30 31  31* 34*  GLUCOSE 119* 127* 95  --   --   BUN 10 12 17 17 15   CREATININE 0.71 0.47 0.58 0.5 0.4*  CALCIUM 7.8* 7.5* 8.3* 8.4* 8.6*  MG  2.0 1.8 2.0  --   --   PHOS 2.4* 3.4 2.8  --   --    Liver Function Tests: Recent Labs    06/05/24 1258  AST 23  ALT 17  ALKPHOS 110  BILITOT 0.9  PROT 6.4*  ALBUMIN 2.4*    CBC: Recent Labs    06/07/24 1239 06/08/24 0635 06/12/24 0605 06/14/24 0000 06/21/24 0000 07/13/24 0000  WBC 12.7* 11.4* 8.7 6.4 7.6 9.0  NEUTROABS  --   --   --  4,250.00 6,050.00 6,975.00  HGB 9.8* 8.0* 8.3* 9.3* 9.9* 9.6*  HCT 29.1* 23.4* 25.9* 29* 31* 29*  MCV 102.1* 101.7* 104.4*  --   --   --   PLT 465* 342 343 440* 343 367    Lab Results  Component Value Date   TSH 1.806 05/24/2024   Lab Results  Component Value Date   VITAMINB12 384 05/24/2024   Lab Results  Component Value Date   FOLATE 10.0 05/24/2024   Lab Results  Component Value Date   IRON 75 01/06/2024   TIBC 218 (L) 01/06/2024   FERRITIN 325 (H) 01/06/2024     Imaging and Procedures obtained prior to SNF admission: CT Angio Chest Pulmonary Embolism (PE) W or WO Contrast Result Date: 06/07/2024 CLINICAL DATA:  Pulmonary embolism suspected high probability. History of interstitial lung disease presenting with cough and dizziness EXAM: CT ANGIOGRAPHY CHEST WITH CONTRAST TECHNIQUE: Multidetector CT imaging of the chest was performed using the standard protocol during bolus administration of intravenous contrast. Multiplanar CT image reconstructions and MIPs were obtained to evaluate the vascular anatomy. RADIATION DOSE REDUCTION: This exam was performed according to the departmental dose-optimization program which includes automated exposure control, adjustment of the mA and/or kV according to patient size and/or use of iterative reconstruction technique. CONTRAST:  75mL OMNIPAQUE  IOHEXOL  350 MG/ML SOLN COMPARISON:  Same day chest radiograph and prior studies FINDINGS: Cardiovascular: Motion artifact and contrast bolus timing limits evaluation specifically of the bilateral lower lobe pulmonary artery branches. No definite pulmonary  embolism identified elsewhere. Mediastinum/Nodes: Redemonstration of marked dilation of the proximal esophagus. Unchanged mildly enlarged mediastinal lymph nodes with a right paratracheal node measuring up to 1.0 cm in short axis, possibly reactive. Lungs/Pleura: Extensive bibasilar airspace disease with slightly increased consolidation in the bilateral lower lobes. Extensive bronchiectasis at the bilateral lower lobes with peripheral reticular opacities throughout the lungs, similar to prior. Small right and trace left pleural effusion. Upper Abdomen: No acute findings. Musculoskeletal: No acute osseous findings. Review of the MIP images confirms the above findings. IMPRESSION: 1. Limited examination with nondiagnostic evaluation of the bilateral lower lobe pulmonary artery branches due to motion artifact and contrast bolus timing. However, no definite pulmonary embolism identified in the remaining pulmonary arteries bilaterally. 2. Extensive lower lobe predominant pulmonary opacities bilaterally, slightly increased compared to prior and possibly  related infection. 3.  Small right and trace left pleural effusions. Aortic Atherosclerosis (ICD10-I70.0). Electronically Signed   By: Michaeline Blanch M.D.   On: 06/07/2024 15:52   US  EKG SITE RITE Result Date: 06/07/2024 If Site Rite image not attached, placement could not be confirmed due to current cardiac rhythm.  DG Chest Port 1 View Result Date: 06/07/2024 CLINICAL DATA:  Shortness of breath. EXAM: PORTABLE CHEST 1 VIEW COMPARISON:  June 05, 2024. FINDINGS: The heart size and mediastinal contours are within normal limits. Stable bibasilar infiltrates or edema is noted with stable right pleural effusion. The visualized skeletal structures are unremarkable. IMPRESSION: Stable bibasilar infiltrates or edema is noted with stable right pleural effusion. Electronically Signed   By: Lynwood Landy Raddle M.D.   On: 06/07/2024 12:58   ECHOCARDIOGRAM COMPLETE Result Date:  06/06/2024    ECHOCARDIOGRAM REPORT   Patient Name:   Crystal Gregory Date of Exam: 06/06/2024 Medical Rec #:  [id number]      Height:       68.0 in Accession #:    [id number]     Weight:       108.0 lb Date of Birth:  1932/02/25      BSA:          1.573 m Patient Age:    89 years       BP:           122/50 mmHg Patient Gender: F              HR:           80 bpm. Exam Location:  [facility] Procedure: 2D Echo, Cardiac Doppler, 3D Echo, Color Doppler and Strain Analysis            (Both Spectral and Color Flow Doppler were utilized during            procedure). Indications:     Atrial Fibrillation I48.91  History:         Patient has prior history of Echocardiogram examinations, most                  recent 01/21/2022. Signs/Symptoms:Chest Pain and Dyspnea.  Sonographer:     Christopher Furnace Referring Phys:  [id number] ELVAN SOR Diagnosing Phys: Evalene Lunger MD  Sonographer Comments: Global longitudinal strain was attempted. IMPRESSIONS  1. Left ventricular ejection fraction, by estimation, is 60 to 65%. The left ventricle has normal function. The left ventricle has no regional wall motion abnormalities. Left ventricular diastolic parameters are consistent with Grade I diastolic dysfunction (impaired relaxation). The average left ventricular global longitudinal strain is -14.1 %.  2. Right ventricular systolic function is normal. The right ventricular size is normal.  3. The mitral valve is normal in structure. Mild to moderate mitral valve regurgitation. No evidence of mitral stenosis.  4. The aortic valve is normal in structure. Aortic valve regurgitation is not visualized. Aortic valve sclerosis is present, with no evidence of aortic valve stenosis.  5. The inferior vena cava is normal in size with greater than 50% respiratory variability, suggesting right atrial pressure of 3 mmHg. FINDINGS  Left Ventricle: Left ventricular ejection fraction, by estimation, is 60 to 65%. The left ventricle has normal function. The left ventricle  has no regional wall motion abnormalities. The average left ventricular global longitudinal strain is -14.1 %. Strain was performed and the global longitudinal strain is indeterminate. The left ventricular internal cavity size was normal in size. There is no left ventricular hypertrophy.  Left ventricular diastolic parameters are consistent with Grade I diastolic dysfunction (impaired relaxation). Right Ventricle: The right ventricular size is normal. No increase in right ventricular wall thickness. Right ventricular systolic function is normal. Left Atrium: Left atrial size was normal in size. Right Atrium: Right atrial size was normal in size. Pericardium: There is no evidence of pericardial effusion. Mitral Valve: The mitral valve is normal in structure. Mild mitral annular calcification. Mild to moderate mitral valve regurgitation. No evidence of mitral valve stenosis. MV peak gradient, 6.0 mmHg. The mean mitral valve gradient is 2.0 mmHg. Tricuspid Valve: The tricuspid valve is normal in structure. Tricuspid valve regurgitation is mild . No evidence of tricuspid stenosis. Aortic Valve: The aortic valve is normal in structure. Aortic valve regurgitation is not visualized. Aortic valve sclerosis is present, with no evidence of aortic valve stenosis. Aortic valve mean gradient measures 3.5 mmHg. Aortic valve peak gradient measures 6.0 mmHg. Aortic valve area, by VTI measures 3.30 cm. Pulmonic Valve: The pulmonic valve was normal in structure. Pulmonic valve regurgitation is not visualized. No evidence of pulmonic stenosis. Aorta: The aortic root is normal in size and structure. Venous: The inferior vena cava is normal in size with greater than 50% respiratory variability, suggesting right atrial pressure of 3 mmHg. IAS/Shunts: No atrial level shunt detected by color flow Doppler. Additional Comments: 3D was performed not requiring image post processing on an independent workstation and was indeterminate.  LEFT  VENTRICLE PLAX 2D LVIDd:         2.60 cm   Diastology LVIDs:         1.90 cm   LV e' medial:    8.27 cm/s LV PW:         1.20 cm   LV E/e' medial:  10.1 LV IVS:        1.40 cm   LV e' lateral:   6.53 cm/s LVOT diam:     1.90 cm   LV E/e' lateral: 12.8 LV SV:         76 LV SV Index:   48        2D Longitudinal Strain LVOT Area:     2.84 cm  2D Strain GLS Avg:     -14.1 %  RIGHT VENTRICLE RV Basal diam:  3.70 cm RV Mid diam:    1.80 cm RV S prime:     13.60 cm/s TAPSE (M-mode): 2.1 cm LEFT ATRIUM           Index        RIGHT ATRIUM           Index LA diam:      3.00 cm 1.91 cm/m   RA Area:     13.50 cm LA Vol (A4C): 52.5 ml 33.38 ml/m  RA Volume:   32.00 ml  20.35 ml/m  AORTIC VALVE AV Area (Vmax):    2.89 cm AV Area (Vmean):   2.61 cm AV Area (VTI):     3.30 cm AV Vmax:           122.50 cm/s AV Vmean:          88.100 cm/s AV VTI:            0.230 m AV Peak Grad:      6.0 mmHg AV Mean Grad:      3.5 mmHg LVOT Vmax:         125.00 cm/s LVOT Vmean:        81.100 cm/s LVOT  VTI:          0.268 m LVOT/AV VTI ratio: 1.16  AORTA Ao Root diam: 3.20 cm MITRAL VALVE                TRICUSPID VALVE MV Area (PHT): 3.60 cm     TR Peak grad:   33.6 mmHg MV Area VTI:   2.87 cm     TR Vmax:        290.00 cm/s MV Peak grad:  6.0 mmHg MV Mean grad:  2.0 mmHg     SHUNTS MV Vmax:       1.22 m/s     Systemic VTI:  0.27 m MV Vmean:      69.8 cm/s    Systemic Diam: 1.90 cm MV Decel Time: 211 msec MV E velocity: 83.80 cm/s MV A velocity: 121.00 cm/s MV E/A ratio:  0.69 Evalene Lunger MD Electronically signed by Evalene Lunger MD Signature Date/Time: 06/06/2024/2:10:32 PM    Final      Assessment/Plan Assessment & Plan Hospice care Pt expired on 08/28/24. Cause of death was chronic respiratory failure with hypoxia. Due to interstitial lung disease.      Camellia Door, DO  [facility] [phone]

## 2024-08-04 DEATH — deceased

## 2024-08-14 ENCOUNTER — Encounter: Payer: Self-pay | Admitting: Oncology

## 2024-08-21 NOTE — Progress Notes (Deleted)
 Electrophysiology Clinic Note    Date:  08/21/2024  Patient ID:  Sangita, Zani 1932/05/10, MRN [id number] PCP:  [name], NP  Cardiologist:  Evalene Lunger, MD  Electrophysiology APP:  Anjelique Makar, NP     Discussed the use of AI scribe software for clinical note transcription with the patient, who gave verbal consent to proceed.   Patient Profile    Chief Complaint: AFib  History of Present Illness: Gatha Mcnulty is a 89 y.o. female with PMH notable for Afib, non-obs CAD, ILD/pulm fibrosis, RA; seen today for routine EP follow-up.   She presented to [facility] ER 06/06/2024 with SOB, palpitations. Found ot be in AFib w RVR. CT chest at that time with concerns for PNA. She was started on IV dilt and converted to sinus. She was started on low-dose eliquis , dose reduced for age, weight. She then had increased SOB with recurrence of afib w RVR, and was started on IV amiodarone  > converted to sinus. She was transitioned to PO amiodarone  and discharged to continue amio load.   I saw her 07/2024 for initial EP evaluation. No recurrence of Afib that she was aware of. Do not favor amiodarone  long-term given ILD. Shared decision making with patient and daughter and amiodarone  was reduced to 100mg  daily.   On follow-up today, *** AF burden, symptoms *** palpitations *** bleeding concerns       Daughter joins for visit and provides considerable HPI.      Arrhythmia/Device History Amiodarone     ROS:  Please see the history of present illness. All other systems are reviewed and otherwise negative.    Physical Exam    VS:  There were no vitals taken for this visit. BMI: There is no height or weight on file to calculate BMI.           Wt Readings from Last 3 Encounters:  10-Aug-2024 89 lb 12.8 oz (40.7 kg)  07/26/24 89 lb 12.8 oz (40.7 kg)  07/23/24 90 lb 12.8 oz (41.2 kg)     GEN- frail, chronically-ill appearing, alert and oriented x 3 today.   Lungs-  increased work of breathing, diminished throughout. Bracken in place Heart- Regular rate and rhythm, no murmurs, rubs or gallops Extremities- 1+ bilat pedal edema, warm, dry    Studies Reviewed   Previous EP, cardiology notes.    EKG is ordered. Personal review of EKG from today shows:          TTE, 06/06/2024  1. Left ventricular ejection fraction, by estimation, is 60 to 65%. The left ventricle has normal function. The left ventricle has no regional wall motion abnormalities. Left ventricular diastolic parameters are consistent with Grade I diastolic dysfunction (impaired relaxation). The average left ventricular global longitudinal strain is -14.1 %.   2. Right ventricular systolic function is normal. The right ventricular size is normal.   3. The mitral valve is normal in structure. Mild to moderate mitral valve  regurgitation. No evidence of mitral stenosis.   4. The aortic valve is normal in structure. Aortic valve regurgitation is not visualized. Aortic valve sclerosis is present, with no evidence of aortic valve stenosis.   5. The inferior vena cava is normal in size with greater than 50% respiratory variability, suggesting right atrial pressure of 3 mmHg.   TTE, 01/21/2022  1. Left ventricular ejection fraction, by estimation, is 60 to 65%. The left ventricle has normal function. The left ventricle has no regional wall motion abnormalities. Left  ventricular diastolic parameters are consistent with Grade I diastolic dysfunction (impaired relaxation). The average left ventricular global longitudinal strain is -20.3 %. The global longitudinal strain is normal.   2. Right ventricular systolic function is normal. The right ventricular size is normal. There is mildly elevated pulmonary artery systolic pressure. The estimated right ventricular systolic pressure is 37.8 mmHg.   3. Right atrial size was mildly dilated.   4. The mitral valve is normal in structure. No evidence of mitral valve  regurgitation.   5. The aortic valve is tricuspid. Aortic valve regurgitation is not visualized.   6. The inferior vena cava is normal in size with <50% respiratory variability, suggesting right atrial pressure of 8 mmHg.     Assessment and Plan     #) parox AFib #) amiodarone  monitoring #) ILD, COPD on home Os Recently diagnosed with afib iso hospitalization for PNA Initially started on IV dilt > converted to sinus > recurrence with elevated ventricular rates so was transitioned to amiodarone  Has since been treated for PNA without recurrence of AFib Do not favor amiodarone  long term with patient's ILD and advanced COPD Discussion with patient and daughter regarding stopping amio today and consider alternative AAD if has recurrence, vs reducing dose.  Daughter favored reducing amiodarone  dose to 100mg  daily, and if continues to maintain sinus, will stop at follow-up   #) Hypercoag d/t afib CHA2DS2-VASc Score = at least 3 [CHF History: 0, HTN History: 0, Diabetes History: 0, Stroke History: 0, Vascular Disease History: 0, Age Score: 2, Gender Score: 1].  Therefore, the patient's annual risk of stroke is 3.2 %.    Stroke ppx - 2.5mg  eliquis  BID, appropriately dosed No significant bleeding concerns         Current medicines are reviewed at length with the patient today.   The patient has concerns regarding her medicines.  The following changes were made today:   REDUCE amiodarone  to 100mg  daily  Labs/ tests ordered today include:  No orders of the defined types were placed in this encounter.    Disposition: Follow up with Dr. Kennyth or EP APP in 4 weeks   Signed, Hildreth Orsak, NP  08/21/24  6:38 PM  Electrophysiology [facility]

## 2024-08-22 ENCOUNTER — Ambulatory Visit: Attending: Cardiology | Admitting: Cardiology

## 2024-08-22 DIAGNOSIS — I480 Paroxysmal atrial fibrillation: Secondary | ICD-10-CM

## 2024-08-28 ENCOUNTER — Encounter: Payer: Self-pay | Admitting: Oncology

## 2025-01-04 ENCOUNTER — Ambulatory Visit: Admitting: Oncology

## 2025-01-04 ENCOUNTER — Other Ambulatory Visit

## 2025-05-06 ENCOUNTER — Encounter
